# Patient Record
Sex: Female | Born: 1955 | Race: Black or African American | Hispanic: No | Marital: Single | Smoking: Current every day smoker
Health system: Southern US, Community
[De-identification: ages and names within clinical notes are randomized; demographics above are authoritative.]

## PROBLEM LIST (undated history)

## (undated) DIAGNOSIS — K219 Gastro-esophageal reflux disease without esophagitis: Secondary | ICD-10-CM

## (undated) DIAGNOSIS — F419 Anxiety disorder, unspecified: Secondary | ICD-10-CM

## (undated) DIAGNOSIS — I214 Non-ST elevation (NSTEMI) myocardial infarction: Secondary | ICD-10-CM

## (undated) DIAGNOSIS — I739 Peripheral vascular disease, unspecified: Secondary | ICD-10-CM

## (undated) DIAGNOSIS — I701 Atherosclerosis of renal artery: Secondary | ICD-10-CM

## (undated) DIAGNOSIS — I771 Stricture of artery: Secondary | ICD-10-CM

## (undated) DIAGNOSIS — K254 Chronic or unspecified gastric ulcer with hemorrhage: Secondary | ICD-10-CM

## (undated) DIAGNOSIS — M545 Other chronic pain: Secondary | ICD-10-CM

## (undated) DIAGNOSIS — F32A Depression, unspecified: Secondary | ICD-10-CM

## (undated) DIAGNOSIS — N183 Chronic kidney disease, stage 3 (moderate): Secondary | ICD-10-CM

## (undated) DIAGNOSIS — E1142 Type 2 diabetes mellitus with diabetic polyneuropathy: Secondary | ICD-10-CM

## (undated) DIAGNOSIS — Z9289 Personal history of other medical treatment: Secondary | ICD-10-CM

## (undated) DIAGNOSIS — R51 Headache: Secondary | ICD-10-CM

## (undated) DIAGNOSIS — R519 Headache, unspecified: Secondary | ICD-10-CM

## (undated) DIAGNOSIS — I10 Essential (primary) hypertension: Secondary | ICD-10-CM

## (undated) DIAGNOSIS — G8929 Other chronic pain: Secondary | ICD-10-CM

## (undated) DIAGNOSIS — E119 Type 2 diabetes mellitus without complications: Secondary | ICD-10-CM

## (undated) DIAGNOSIS — J189 Pneumonia, unspecified organism: Secondary | ICD-10-CM

## (undated) DIAGNOSIS — I2510 Atherosclerotic heart disease of native coronary artery without angina pectoris: Secondary | ICD-10-CM

## (undated) DIAGNOSIS — D649 Anemia, unspecified: Secondary | ICD-10-CM

## (undated) DIAGNOSIS — M1990 Unspecified osteoarthritis, unspecified site: Secondary | ICD-10-CM

## (undated) DIAGNOSIS — E785 Hyperlipidemia, unspecified: Secondary | ICD-10-CM

## (undated) DIAGNOSIS — I779 Disorder of arteries and arterioles, unspecified: Secondary | ICD-10-CM

## (undated) DIAGNOSIS — F329 Major depressive disorder, single episode, unspecified: Secondary | ICD-10-CM

## (undated) DIAGNOSIS — K922 Gastrointestinal hemorrhage, unspecified: Secondary | ICD-10-CM

## (undated) DIAGNOSIS — I82409 Acute embolism and thrombosis of unspecified deep veins of unspecified lower extremity: Secondary | ICD-10-CM

## (undated) HISTORY — DX: Atherosclerosis of renal artery: I70.1

## (undated) HISTORY — PX: LYMPH GLAND EXCISION: SHX13

## (undated) HISTORY — DX: Stricture of artery: I77.1

## (undated) HISTORY — DX: Peripheral vascular disease, unspecified: I73.9

## (undated) HISTORY — DX: Acute embolism and thrombosis of unspecified deep veins of unspecified lower extremity: I82.409

## (undated) HISTORY — PX: BACK SURGERY: SHX140

## (undated) HISTORY — PX: CARDIAC CATHETERIZATION: SHX172

## (undated) HISTORY — PX: CORONARY ANGIOPLASTY WITH STENT PLACEMENT: SHX49

## (undated) HISTORY — PX: SHOULDER ADHESION RELEASE: SHX773

## (undated) HISTORY — PX: FRACTURE SURGERY: SHX138

## (undated) HISTORY — PX: CATARACT EXTRACTION, BILATERAL: SHX1313

## (undated) HISTORY — PX: KNEE ARTHROSCOPY: SUR90

## (undated) HISTORY — DX: Hyperlipidemia, unspecified: E78.5

## (undated) HISTORY — DX: Chronic kidney disease, stage 3 (moderate): N18.3

---

## 1987-11-25 HISTORY — PX: TUBAL LIGATION: SHX77

## 2000-02-27 ENCOUNTER — Emergency Department (HOSPITAL_COMMUNITY): Admission: EM | Admit: 2000-02-27 | Discharge: 2000-02-27 | Payer: Self-pay | Admitting: Emergency Medicine

## 2000-03-01 ENCOUNTER — Emergency Department (HOSPITAL_COMMUNITY): Admission: EM | Admit: 2000-03-01 | Discharge: 2000-03-01 | Payer: Self-pay | Admitting: *Deleted

## 2001-02-14 ENCOUNTER — Emergency Department (HOSPITAL_COMMUNITY): Admission: EM | Admit: 2001-02-14 | Discharge: 2001-02-14 | Payer: Self-pay | Admitting: Emergency Medicine

## 2002-02-27 ENCOUNTER — Emergency Department (HOSPITAL_COMMUNITY): Admission: EM | Admit: 2002-02-27 | Discharge: 2002-02-27 | Payer: Self-pay | Admitting: Emergency Medicine

## 2002-09-26 ENCOUNTER — Encounter: Payer: Self-pay | Admitting: Emergency Medicine

## 2002-09-26 ENCOUNTER — Encounter: Payer: Self-pay | Admitting: Surgery

## 2002-09-26 ENCOUNTER — Inpatient Hospital Stay (HOSPITAL_COMMUNITY): Admission: AC | Admit: 2002-09-26 | Discharge: 2002-09-27 | Payer: Self-pay

## 2002-10-03 ENCOUNTER — Encounter: Admission: RE | Admit: 2002-10-03 | Discharge: 2002-10-03 | Payer: Self-pay | Admitting: Internal Medicine

## 2002-11-30 ENCOUNTER — Encounter: Admission: RE | Admit: 2002-11-30 | Discharge: 2003-02-16 | Payer: Self-pay | Admitting: Orthopedic Surgery

## 2003-02-22 ENCOUNTER — Encounter: Admission: RE | Admit: 2003-02-22 | Discharge: 2003-05-23 | Payer: Self-pay | Admitting: Orthopedic Surgery

## 2003-03-25 HISTORY — PX: EXAM UNDER ANESTHESIA WITH MANIPULATION OF SHOULDER: SHX5817

## 2003-04-10 ENCOUNTER — Ambulatory Visit (HOSPITAL_BASED_OUTPATIENT_CLINIC_OR_DEPARTMENT_OTHER): Admission: RE | Admit: 2003-04-10 | Discharge: 2003-04-10 | Payer: Self-pay | Admitting: Orthopedic Surgery

## 2003-05-24 ENCOUNTER — Encounter: Admission: RE | Admit: 2003-05-24 | Discharge: 2003-06-08 | Payer: Self-pay | Admitting: Orthopedic Surgery

## 2003-12-24 ENCOUNTER — Emergency Department (HOSPITAL_COMMUNITY): Admission: EM | Admit: 2003-12-24 | Discharge: 2003-12-25 | Payer: Self-pay | Admitting: *Deleted

## 2004-01-04 ENCOUNTER — Emergency Department (HOSPITAL_COMMUNITY): Admission: EM | Admit: 2004-01-04 | Discharge: 2004-01-04 | Payer: Self-pay | Admitting: Family Medicine

## 2007-08-12 ENCOUNTER — Ambulatory Visit: Payer: Self-pay | Admitting: Internal Medicine

## 2007-08-16 ENCOUNTER — Ambulatory Visit: Payer: Self-pay | Admitting: *Deleted

## 2008-02-29 ENCOUNTER — Emergency Department (HOSPITAL_COMMUNITY): Admission: EM | Admit: 2008-02-29 | Discharge: 2008-02-29 | Payer: Self-pay | Admitting: Emergency Medicine

## 2008-04-12 ENCOUNTER — Ambulatory Visit: Payer: Self-pay | Admitting: Internal Medicine

## 2008-04-14 ENCOUNTER — Ambulatory Visit: Payer: Self-pay | Admitting: Internal Medicine

## 2008-06-30 ENCOUNTER — Emergency Department (HOSPITAL_COMMUNITY): Admission: EM | Admit: 2008-06-30 | Discharge: 2008-06-30 | Payer: Self-pay | Admitting: Emergency Medicine

## 2009-01-11 ENCOUNTER — Ambulatory Visit: Payer: Self-pay | Admitting: Family Medicine

## 2009-05-31 ENCOUNTER — Emergency Department (HOSPITAL_COMMUNITY): Admission: EM | Admit: 2009-05-31 | Discharge: 2009-05-31 | Payer: Self-pay | Admitting: Family Medicine

## 2009-08-23 ENCOUNTER — Ambulatory Visit: Payer: Self-pay | Admitting: Internal Medicine

## 2009-08-23 ENCOUNTER — Encounter (INDEPENDENT_AMBULATORY_CARE_PROVIDER_SITE_OTHER): Payer: Self-pay | Admitting: Family Medicine

## 2009-08-24 ENCOUNTER — Encounter (INDEPENDENT_AMBULATORY_CARE_PROVIDER_SITE_OTHER): Payer: Self-pay | Admitting: Family Medicine

## 2009-08-24 LAB — CONVERTED CEMR LAB: Microalb, Ur: 19.81 mg/dL — ABNORMAL HIGH (ref 0.00–1.89)

## 2009-08-29 ENCOUNTER — Ambulatory Visit (HOSPITAL_COMMUNITY): Admission: RE | Admit: 2009-08-29 | Discharge: 2009-08-29 | Payer: Self-pay | Admitting: Family Medicine

## 2009-09-04 ENCOUNTER — Telehealth (INDEPENDENT_AMBULATORY_CARE_PROVIDER_SITE_OTHER): Payer: Self-pay | Admitting: *Deleted

## 2009-09-05 ENCOUNTER — Ambulatory Visit: Payer: Self-pay | Admitting: Internal Medicine

## 2009-09-06 ENCOUNTER — Encounter (INDEPENDENT_AMBULATORY_CARE_PROVIDER_SITE_OTHER): Payer: Self-pay | Admitting: Family Medicine

## 2009-09-06 ENCOUNTER — Ambulatory Visit (HOSPITAL_COMMUNITY): Admission: RE | Admit: 2009-09-06 | Discharge: 2009-09-06 | Payer: Self-pay | Admitting: Family Medicine

## 2009-09-24 ENCOUNTER — Ambulatory Visit: Payer: Self-pay | Admitting: Family Medicine

## 2009-09-25 ENCOUNTER — Encounter (INDEPENDENT_AMBULATORY_CARE_PROVIDER_SITE_OTHER): Payer: Self-pay | Admitting: Family Medicine

## 2009-09-25 LAB — CONVERTED CEMR LAB
ALT: 55 units/L — ABNORMAL HIGH (ref 0–35)
AST: 59 units/L — ABNORMAL HIGH (ref 0–37)
Albumin: 4.3 g/dL (ref 3.5–5.2)
Alkaline Phosphatase: 114 units/L (ref 39–117)
BUN: 15 mg/dL (ref 6–23)
Basophils Absolute: 0 10*3/uL (ref 0.0–0.1)
Basophils Relative: 1 % (ref 0–1)
CO2: 21 meq/L (ref 19–32)
Calcium: 9.4 mg/dL (ref 8.4–10.5)
Chloride: 101 meq/L (ref 96–112)
Cholesterol: 308 mg/dL — ABNORMAL HIGH (ref 0–200)
Creatinine, Ser: 0.96 mg/dL (ref 0.40–1.20)
Eosinophils Absolute: 0.1 10*3/uL (ref 0.0–0.7)
Eosinophils Relative: 1 % (ref 0–5)
Glucose, Bld: 150 mg/dL — ABNORMAL HIGH (ref 70–99)
HCT: 39.9 % (ref 36.0–46.0)
HDL: 49 mg/dL (ref >39)
Hemoglobin: 12.2 g/dL (ref 12.0–15.0)
Lymphocytes Relative: 42 % (ref 12–46)
Lymphs Abs: 2.7 10*3/uL (ref 0.7–4.0)
MCHC: 30.6 g/dL (ref 30.0–36.0)
MCV: 78.5 fL (ref 78.0–100.0)
Monocytes Absolute: 0.5 10*3/uL (ref 0.1–1.0)
Monocytes Relative: 7 % (ref 3–12)
Neutro Abs: 3.2 10*3/uL (ref 1.7–7.7)
Neutrophils Relative %: 49 % (ref 43–77)
Platelets: 289 10*3/uL (ref 150–400)
Potassium: 4.2 meq/L (ref 3.5–5.3)
RBC: 5.08 M/uL (ref 3.87–5.11)
RDW: 14.7 % (ref 11.5–15.5)
Sodium: 141 meq/L (ref 135–145)
Total Bilirubin: 0.6 mg/dL (ref 0.3–1.2)
Total CHOL/HDL Ratio: 6.3
Total Protein: 8.2 g/dL (ref 6.0–8.3)
Triglycerides: 634 mg/dL — ABNORMAL HIGH (ref <150)
Vit D, 25-Hydroxy: 4 ng/mL — ABNORMAL LOW (ref 30–89)
WBC: 6.5 10*3/uL (ref 4.0–10.5)

## 2009-11-11 ENCOUNTER — Emergency Department (HOSPITAL_COMMUNITY): Admission: EM | Admit: 2009-11-11 | Discharge: 2009-11-11 | Payer: Self-pay | Admitting: Emergency Medicine

## 2009-11-20 ENCOUNTER — Ambulatory Visit: Payer: Self-pay | Admitting: Family Medicine

## 2010-07-16 ENCOUNTER — Emergency Department (HOSPITAL_BASED_OUTPATIENT_CLINIC_OR_DEPARTMENT_OTHER): Admission: EM | Admit: 2010-07-16 | Discharge: 2010-07-16 | Payer: Self-pay | Admitting: Emergency Medicine

## 2010-08-27 ENCOUNTER — Emergency Department (HOSPITAL_BASED_OUTPATIENT_CLINIC_OR_DEPARTMENT_OTHER): Admission: EM | Admit: 2010-08-27 | Discharge: 2010-08-28 | Payer: Self-pay | Admitting: Emergency Medicine

## 2010-08-28 ENCOUNTER — Emergency Department (HOSPITAL_BASED_OUTPATIENT_CLINIC_OR_DEPARTMENT_OTHER): Admission: EM | Admit: 2010-08-28 | Discharge: 2010-08-28 | Payer: Self-pay | Admitting: Emergency Medicine

## 2011-01-03 ENCOUNTER — Encounter (INDEPENDENT_AMBULATORY_CARE_PROVIDER_SITE_OTHER): Payer: Self-pay | Admitting: Family Medicine

## 2011-01-03 ENCOUNTER — Other Ambulatory Visit (HOSPITAL_COMMUNITY): Payer: Self-pay | Admitting: Family Medicine

## 2011-01-03 DIAGNOSIS — Z1231 Encounter for screening mammogram for malignant neoplasm of breast: Secondary | ICD-10-CM

## 2011-01-03 DIAGNOSIS — M47812 Spondylosis without myelopathy or radiculopathy, cervical region: Secondary | ICD-10-CM

## 2011-01-03 LAB — CONVERTED CEMR LAB: Microalb, Ur: 15.98 mg/dL — ABNORMAL HIGH (ref 0.00–1.89)

## 2011-01-16 ENCOUNTER — Ambulatory Visit (HOSPITAL_COMMUNITY)
Admission: RE | Admit: 2011-01-16 | Discharge: 2011-01-16 | Disposition: A | Discharge status: Home / Self Care | Payer: Self-pay | Source: Ambulatory Visit | Attending: Family Medicine | Admitting: Family Medicine

## 2011-01-16 DIAGNOSIS — Z1231 Encounter for screening mammogram for malignant neoplasm of breast: Secondary | ICD-10-CM

## 2011-01-22 ENCOUNTER — Other Ambulatory Visit (HOSPITAL_COMMUNITY): Payer: Self-pay | Admitting: Family Medicine

## 2011-01-22 DIAGNOSIS — Z1231 Encounter for screening mammogram for malignant neoplasm of breast: Secondary | ICD-10-CM

## 2011-01-24 ENCOUNTER — Ambulatory Visit (HOSPITAL_COMMUNITY)
Admission: RE | Admit: 2011-01-24 | Discharge: 2011-01-24 | Disposition: A | Discharge status: Home / Self Care | Payer: Self-pay | Source: Ambulatory Visit | Attending: Family Medicine | Admitting: Family Medicine

## 2011-01-24 DIAGNOSIS — Z1231 Encounter for screening mammogram for malignant neoplasm of breast: Secondary | ICD-10-CM | POA: Insufficient documentation

## 2011-01-27 ENCOUNTER — Ambulatory Visit (HOSPITAL_COMMUNITY)
Admission: RE | Admit: 2011-01-27 | Discharge: 2011-01-27 | Disposition: A | Discharge status: Home / Self Care | Payer: Self-pay | Source: Ambulatory Visit | Attending: Family Medicine | Admitting: Family Medicine

## 2011-01-27 DIAGNOSIS — M47812 Spondylosis without myelopathy or radiculopathy, cervical region: Secondary | ICD-10-CM | POA: Insufficient documentation

## 2011-01-27 DIAGNOSIS — R209 Unspecified disturbances of skin sensation: Secondary | ICD-10-CM | POA: Insufficient documentation

## 2011-01-27 DIAGNOSIS — M25519 Pain in unspecified shoulder: Secondary | ICD-10-CM | POA: Insufficient documentation

## 2011-01-27 DIAGNOSIS — M79609 Pain in unspecified limb: Secondary | ICD-10-CM | POA: Insufficient documentation

## 2011-01-27 DIAGNOSIS — M542 Cervicalgia: Secondary | ICD-10-CM | POA: Insufficient documentation

## 2011-02-06 LAB — URINALYSIS, ROUTINE W REFLEX MICROSCOPIC
Bilirubin Urine: NEGATIVE
Bilirubin Urine: NEGATIVE
Glucose, UA: NEGATIVE mg/dL
Hgb urine dipstick: NEGATIVE
Ketones, ur: NEGATIVE mg/dL
Ketones, ur: NEGATIVE mg/dL
Specific Gravity, Urine: 1.006 (ref 1.005–1.030)

## 2011-02-06 LAB — DIFFERENTIAL
Eosinophils Absolute: 0.2 10*3/uL (ref 0.0–0.7)
Lymphocytes Relative: 47 % — ABNORMAL HIGH (ref 12–46)
Lymphs Abs: 3.9 10*3/uL (ref 0.7–4.0)
Monocytes Absolute: 0.5 10*3/uL (ref 0.1–1.0)
Neutro Abs: 3.7 10*3/uL (ref 1.7–7.7)

## 2011-02-06 LAB — CBC
Hemoglobin: 10.9 g/dL — ABNORMAL LOW (ref 12.0–15.0)
MCHC: 32.9 g/dL (ref 30.0–36.0)
MCV: 77.4 fL — ABNORMAL LOW (ref 78.0–100.0)
RBC: 4.28 MIL/uL (ref 3.87–5.11)
RDW: 14 % (ref 11.5–15.5)

## 2011-02-06 LAB — COMPREHENSIVE METABOLIC PANEL
AST: 36 U/L (ref 0–37)
Alkaline Phosphatase: 119 U/L — ABNORMAL HIGH (ref 39–117)
Chloride: 110 mEq/L (ref 96–112)
Creatinine, Ser: 0.8 mg/dL (ref 0.4–1.2)
GFR calc Af Amer: >60 mL/min (ref >60)
Glucose, Bld: 180 mg/dL — ABNORMAL HIGH (ref 70–99)
Sodium: 143 mEq/L (ref 135–145)
Total Bilirubin: 0.4 mg/dL (ref 0.3–1.2)

## 2011-02-06 LAB — URINE MICROSCOPIC-ADD ON

## 2011-02-06 LAB — BASIC METABOLIC PANEL
CO2: 23 mEq/L (ref 19–32)
Calcium: 9.7 mg/dL (ref 8.4–10.5)
GFR calc Af Amer: >60 mL/min (ref >60)
GFR calc non Af Amer: >60 mL/min (ref >60)

## 2011-02-06 LAB — GLUCOSE, CAPILLARY
Glucose-Capillary: 177 mg/dL — ABNORMAL HIGH (ref 70–99)
Glucose-Capillary: 319 mg/dL — ABNORMAL HIGH (ref 70–99)

## 2011-02-06 LAB — HEMOCCULT GUIAC POC 1CARD (OFFICE): Fecal Occult Bld: NEGATIVE

## 2011-02-07 LAB — BASIC METABOLIC PANEL
Chloride: 105 mEq/L (ref 96–112)
Creatinine, Ser: 0.8 mg/dL (ref 0.4–1.2)
GFR calc Af Amer: >60 mL/min (ref >60)
GFR calc non Af Amer: >60 mL/min (ref >60)

## 2011-02-07 LAB — DIFFERENTIAL
Eosinophils Relative: 2 % (ref 0–5)
Lymphocytes Relative: 40 % (ref 12–46)
Lymphs Abs: 2.7 10*3/uL (ref 0.7–4.0)
Monocytes Absolute: 0.3 10*3/uL (ref 0.1–1.0)

## 2011-02-07 LAB — POCT TOXICOLOGY PANEL

## 2011-02-07 LAB — URINALYSIS, ROUTINE W REFLEX MICROSCOPIC
Ketones, ur: NEGATIVE mg/dL
Nitrite: NEGATIVE
pH: 5.5 (ref 5.0–8.0)

## 2011-02-07 LAB — URINE MICROSCOPIC-ADD ON

## 2011-02-07 LAB — CBC
HCT: 40.5 % (ref 36.0–46.0)
MCV: 80.2 fL (ref 78.0–100.0)
Platelets: 287 10*3/uL (ref 150–400)
RBC: 5.05 MIL/uL (ref 3.87–5.11)
WBC: 6.7 10*3/uL (ref 4.0–10.5)

## 2011-02-24 ENCOUNTER — Emergency Department (HOSPITAL_COMMUNITY)
Admission: EM | Admit: 2011-02-24 | Discharge: 2011-02-24 | Disposition: A | Discharge status: Home / Self Care | Payer: Self-pay | Attending: Emergency Medicine | Admitting: Emergency Medicine

## 2011-02-24 DIAGNOSIS — I10 Essential (primary) hypertension: Secondary | ICD-10-CM | POA: Insufficient documentation

## 2011-02-24 DIAGNOSIS — Z862 Personal history of diseases of the blood and blood-forming organs and certain disorders involving the immune mechanism: Secondary | ICD-10-CM | POA: Insufficient documentation

## 2011-02-24 DIAGNOSIS — E785 Hyperlipidemia, unspecified: Secondary | ICD-10-CM | POA: Insufficient documentation

## 2011-02-24 DIAGNOSIS — M542 Cervicalgia: Secondary | ICD-10-CM | POA: Insufficient documentation

## 2011-02-24 DIAGNOSIS — M545 Low back pain, unspecified: Secondary | ICD-10-CM | POA: Insufficient documentation

## 2011-02-24 DIAGNOSIS — F3289 Other specified depressive episodes: Secondary | ICD-10-CM | POA: Insufficient documentation

## 2011-02-24 DIAGNOSIS — Z79899 Other long term (current) drug therapy: Secondary | ICD-10-CM | POA: Insufficient documentation

## 2011-02-24 DIAGNOSIS — E119 Type 2 diabetes mellitus without complications: Secondary | ICD-10-CM | POA: Insufficient documentation

## 2011-02-24 DIAGNOSIS — F329 Major depressive disorder, single episode, unspecified: Secondary | ICD-10-CM | POA: Insufficient documentation

## 2011-02-24 DIAGNOSIS — N390 Urinary tract infection, site not specified: Secondary | ICD-10-CM | POA: Insufficient documentation

## 2011-02-24 DIAGNOSIS — Z8639 Personal history of other endocrine, nutritional and metabolic disease: Secondary | ICD-10-CM | POA: Insufficient documentation

## 2011-02-24 DIAGNOSIS — M5412 Radiculopathy, cervical region: Secondary | ICD-10-CM | POA: Insufficient documentation

## 2011-02-24 DIAGNOSIS — A599 Trichomoniasis, unspecified: Secondary | ICD-10-CM | POA: Insufficient documentation

## 2011-02-24 LAB — URINALYSIS, ROUTINE W REFLEX MICROSCOPIC
Bilirubin Urine: NEGATIVE
Hgb urine dipstick: NEGATIVE
Specific Gravity, Urine: 1.014 (ref 1.005–1.030)
Urobilinogen, UA: 0.2 mg/dL (ref 0.0–1.0)

## 2011-02-24 LAB — URINE MICROSCOPIC-ADD ON
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
## 2011-04-25 HISTORY — PX: ANTERIOR CERVICAL DECOMP/DISCECTOMY FUSION: SHX1161

## 2011-05-13 ENCOUNTER — Encounter (HOSPITAL_COMMUNITY)
Admission: RE | Admit: 2011-05-13 | Discharge: 2011-05-13 | Disposition: A | Discharge status: Home / Self Care | Payer: Self-pay | Source: Ambulatory Visit | Attending: Neurosurgery | Admitting: Neurosurgery

## 2011-05-13 ENCOUNTER — Other Ambulatory Visit (HOSPITAL_COMMUNITY): Payer: Self-pay | Admitting: Neurosurgery

## 2011-05-13 DIAGNOSIS — E785 Hyperlipidemia, unspecified: Secondary | ICD-10-CM | POA: Insufficient documentation

## 2011-05-13 DIAGNOSIS — I10 Essential (primary) hypertension: Secondary | ICD-10-CM | POA: Insufficient documentation

## 2011-05-13 DIAGNOSIS — M5020 Other cervical disc displacement, unspecified cervical region: Secondary | ICD-10-CM

## 2011-05-13 DIAGNOSIS — E119 Type 2 diabetes mellitus without complications: Secondary | ICD-10-CM | POA: Insufficient documentation

## 2011-05-13 DIAGNOSIS — Z01812 Encounter for preprocedural laboratory examination: Secondary | ICD-10-CM | POA: Insufficient documentation

## 2011-05-13 DIAGNOSIS — Z01818 Encounter for other preprocedural examination: Secondary | ICD-10-CM | POA: Insufficient documentation

## 2011-05-13 DIAGNOSIS — I77819 Aortic ectasia, unspecified site: Secondary | ICD-10-CM | POA: Insufficient documentation

## 2011-05-13 DIAGNOSIS — F17200 Nicotine dependence, unspecified, uncomplicated: Secondary | ICD-10-CM | POA: Insufficient documentation

## 2011-05-13 LAB — CBC
Platelets: 376 10*3/uL (ref 150–400)
RDW: 15.9 % — ABNORMAL HIGH (ref 11.5–15.5)
WBC: 9.5 10*3/uL (ref 4.0–10.5)

## 2011-05-13 LAB — URINALYSIS, ROUTINE W REFLEX MICROSCOPIC
Glucose, UA: NEGATIVE mg/dL
Leukocytes, UA: NEGATIVE
Nitrite: NEGATIVE
Protein, ur: NEGATIVE mg/dL
Urobilinogen, UA: 0.2 mg/dL (ref 0.0–1.0)

## 2011-05-13 LAB — PROTIME-INR
INR: 0.97 (ref 0.00–1.49)
Prothrombin Time: 13.1 seconds (ref 11.6–15.2)

## 2011-05-13 LAB — BASIC METABOLIC PANEL
BUN: 17 mg/dL (ref 6–23)
Calcium: 9.5 mg/dL (ref 8.4–10.5)
Creatinine, Ser: 0.82 mg/dL (ref 0.50–1.10)
GFR calc Af Amer: >60 mL/min (ref >60)

## 2011-05-13 LAB — SURGICAL PCR SCREEN: MRSA, PCR: NEGATIVE

## 2011-05-13 LAB — APTT: aPTT: 34 seconds (ref 24–37)

## 2011-05-20 ENCOUNTER — Inpatient Hospital Stay (HOSPITAL_COMMUNITY)
Admission: RE | Admit: 2011-05-20 | Discharge: 2011-05-22 | DRG: 472 | Disposition: A | Discharge status: Home / Self Care | Payer: Medicaid Other | Source: Ambulatory Visit | Attending: Neurosurgery | Admitting: Neurosurgery

## 2011-05-20 ENCOUNTER — Inpatient Hospital Stay (HOSPITAL_COMMUNITY): Payer: Medicaid Other

## 2011-05-20 DIAGNOSIS — M129 Arthropathy, unspecified: Secondary | ICD-10-CM | POA: Diagnosis present

## 2011-05-20 DIAGNOSIS — Z01818 Encounter for other preprocedural examination: Secondary | ICD-10-CM

## 2011-05-20 DIAGNOSIS — Z01812 Encounter for preprocedural laboratory examination: Secondary | ICD-10-CM

## 2011-05-20 DIAGNOSIS — I10 Essential (primary) hypertension: Secondary | ICD-10-CM | POA: Diagnosis present

## 2011-05-20 DIAGNOSIS — E119 Type 2 diabetes mellitus without complications: Secondary | ICD-10-CM | POA: Diagnosis present

## 2011-05-20 DIAGNOSIS — M5000 Cervical disc disorder with myelopathy, unspecified cervical region: Secondary | ICD-10-CM | POA: Diagnosis present

## 2011-05-20 DIAGNOSIS — F17200 Nicotine dependence, unspecified, uncomplicated: Secondary | ICD-10-CM | POA: Diagnosis present

## 2011-05-20 DIAGNOSIS — M4712 Other spondylosis with myelopathy, cervical region: Principal | ICD-10-CM | POA: Diagnosis present

## 2011-05-20 DIAGNOSIS — M47812 Spondylosis without myelopathy or radiculopathy, cervical region: Secondary | ICD-10-CM | POA: Diagnosis present

## 2011-05-21 LAB — GLUCOSE, CAPILLARY
Glucose-Capillary: 216 mg/dL — ABNORMAL HIGH (ref 70–99)
Glucose-Capillary: 123 mg/dL — ABNORMAL HIGH (ref 70–99)
Glucose-Capillary: 167 mg/dL — ABNORMAL HIGH (ref 70–99)
Glucose-Capillary: 114 mg/dL — ABNORMAL HIGH (ref 70–99)
Glucose-Capillary: 112 mg/dL — ABNORMAL HIGH (ref 70–99)

## 2011-05-22 LAB — GLUCOSE, CAPILLARY: Glucose-Capillary: 106 mg/dL — ABNORMAL HIGH (ref 70–99)

## 2011-05-22 NOTE — Op Note (Signed)
NAMELASHAUN, POCH NO.:  [id number]  MEDICAL RECORD NO.:  [id number]  LOCATION:  3017                         FACILITY:  [facility]  PHYSICIAN:  Clydene Fake, M.D.  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  PROCEDURE:  Anterior cervical decompression, diskectomy, and fusion at C3-C4, C4-C5, C5-C6, and C6-C7 with LifeNet allograft bone, Trestle anterior cervical plate from C3 through C7.  SURGEON:  Clydene Fake, MD  ASSISTANT:  Cristi Loron, MD.  ANESTHESIA:  General endotracheal tube anesthesia.  ESTIMATED BLOOD LOSS:  Minimal.  BLOOD GIVEN:  None.  DRAINS:  None.  COMPLICATIONS:  None.  REASON FOR PROCEDURE:  The patient is a 55 year old woman who has been having neck and arm pain, numbness, also myelopathic symptoms with clumsiness of arms and legs, and found to be myelopathic on exam.  An MRI of cervical spine shows severe spinal changes with disk protrusions causing a stenosis and cord compression at C3-C4, C4-C5, C5-C6, and C6- C7.  The patient was brought in for decompression and fusion at these levels.  PROCEDURE IN DETAIL:  The patient was brought in the operating room, general anesthesia induced.  The patient was placed in a 10 pounds halter traction and prepped and draped in sterile fashion.  Site of incision was injected 10 mL of 1% lidocaine.  An oblique incision was made over the anterior border of the sternocleidomastoid muscle on the left side of the neck.  Incision was taken down to the platysma and hemostasis was obtained with Bovie  cauterization.  The platysma was incised with the Bovie and blunt dissection taken through the anterior cervical fascia to the anterior cervical spine.  Three needles were placed in disk spaces.  X-rays were obtained showing the needles were at the C3-C4, C4-C5, and C6-C7 spaces.  The disk spaces were incised and partial diskectomy done as the needles were removed.  Longus coli muscles were reflected laterally from C3 through C7 on each side and a self-retaining retractor was placed so we could see the C3-C4, C4-C5 spaces.  Distraction pins were placed in the C3 and C5 and the interspace was distracted.  Microscope was brought in for microdissection.  Diskectomy continued with curettes, pituitary rongeurs, and then 1-2-mm Kerrison punches were used to remove posterior disk osteophyte and ligament, decompressing the central canal.  We then performed bilateral foraminotomies.  These were repeated at the C4-C5 level.  A high-speed drill was used to remove cartilaginous endplate and we measured the height of the disk space to be 4 mm and 4-mm LifeNet allograft bones were then tapped into place at each of these levels. Removed the distraction pin from C3, then moved our retraction down so we could see the C5-C6 and C6-C7 level, placed the pin in the C7 and we already had incised the disk spaces and  started partial diskectomy at C5- C6 and C6-C7 and then distracted the interspaces.  Microscope was used and diskectomy continued at C5-C6 and C6-C7 with high-speed drill, curettes, and pituitary rongeurs and 1-2-mm Kerrison punches were used to remove posterior disk osteophyte and ligament, decompressing the central canal and bilateral foraminotomies were performed at both C5-C6 and C6-C7 levels.  We then measured the height of disk space to be 4 mm at each spot, two 4-mm LifeNet allograft bones were tapped into place at these 2 levels.  We irrigated with antibiotic solution.  Microscope  was removed from the field.  The distraction pins were removed, weight was removed from the traction.  The bone was in good firm position at each of the 4-mm spots.  Anterior osteophytes were removed with Leksell rongeurs.  A Trestle anterior cervical plate was placed over the cervical spine and 2 screws placed in the C3, 2 in the C4, 2 in the C5, 2 in the C6, 2 in the C7.  These were finally tightened.  A lateral x- ray was obtained showing good position of plate, screws, bone plugs at C3 through C7.  We had a very good hemostasis.  We irrigated it with antibiotic solution with good hemostasis with Gelfoam thrombin and bipolar cauterization.  The Gelfoam was irrigated out and then the platysma was closed with 3-0 Vicryl interrupted sutures, subcutaneous tissue closed with the same, skin closed with benzoin and Steri-Strips. Dressing was placed.  The patient was placed in a hard cervical collar, awoken from anesthesia, and transferred to the recovery room in stable condition.          ______________________________ Clydene Fake, M.D.     JRH/MEDQ  D:  05/20/2011  T:  05/21/2011  Job:  [id number]  Electronically Signed by Colon Branch M.D. on 05/22/2011 02:23:16 PM

## 2011-08-19 ENCOUNTER — Emergency Department (HOSPITAL_COMMUNITY): Payer: Self-pay

## 2011-08-19 ENCOUNTER — Emergency Department (HOSPITAL_COMMUNITY)
Admission: EM | Admit: 2011-08-19 | Discharge: 2011-08-19 | Disposition: A | Discharge status: Home / Self Care | Payer: Self-pay | Attending: Emergency Medicine | Admitting: Emergency Medicine

## 2011-08-19 DIAGNOSIS — F17200 Nicotine dependence, unspecified, uncomplicated: Secondary | ICD-10-CM | POA: Insufficient documentation

## 2011-08-19 DIAGNOSIS — F3289 Other specified depressive episodes: Secondary | ICD-10-CM | POA: Insufficient documentation

## 2011-08-19 DIAGNOSIS — IMO0002 Reserved for concepts with insufficient information to code with codable children: Secondary | ICD-10-CM | POA: Insufficient documentation

## 2011-08-19 DIAGNOSIS — M25519 Pain in unspecified shoulder: Secondary | ICD-10-CM | POA: Insufficient documentation

## 2011-08-19 DIAGNOSIS — M109 Gout, unspecified: Secondary | ICD-10-CM | POA: Insufficient documentation

## 2011-08-19 DIAGNOSIS — F329 Major depressive disorder, single episode, unspecified: Secondary | ICD-10-CM | POA: Insufficient documentation

## 2011-08-19 DIAGNOSIS — Z79899 Other long term (current) drug therapy: Secondary | ICD-10-CM | POA: Insufficient documentation

## 2011-08-19 DIAGNOSIS — E785 Hyperlipidemia, unspecified: Secondary | ICD-10-CM | POA: Insufficient documentation

## 2011-08-19 DIAGNOSIS — F1911 Other psychoactive substance abuse, in remission: Secondary | ICD-10-CM | POA: Insufficient documentation

## 2011-08-19 DIAGNOSIS — I10 Essential (primary) hypertension: Secondary | ICD-10-CM | POA: Insufficient documentation

## 2011-08-19 DIAGNOSIS — E119 Type 2 diabetes mellitus without complications: Secondary | ICD-10-CM | POA: Insufficient documentation

## 2011-08-19 LAB — CK TOTAL AND CKMB (NOT AT ARMC)
CK, MB: 2.9 ng/mL (ref 0.3–4.0)
Relative Index: 1.3 (ref 0.0–2.5)
Total CK: 218 U/L — ABNORMAL HIGH (ref 7–177)

## 2011-08-19 LAB — COMPREHENSIVE METABOLIC PANEL
BUN: 16 mg/dL (ref 6–23)
CO2: 26 mEq/L (ref 19–32)
Calcium: 9.6 mg/dL (ref 8.4–10.5)
Chloride: 95 mEq/L — ABNORMAL LOW (ref 96–112)
Creatinine, Ser: 1.31 mg/dL — ABNORMAL HIGH (ref 0.50–1.10)
GFR calc Af Amer: 51 mL/min — ABNORMAL LOW (ref >60)
GFR calc non Af Amer: 42 mL/min — ABNORMAL LOW (ref >60)
Glucose, Bld: 128 mg/dL — ABNORMAL HIGH (ref 70–99)
Total Bilirubin: 0.3 mg/dL (ref 0.3–1.2)

## 2011-08-19 LAB — DIFFERENTIAL
Basophils Absolute: 0.1 10*3/uL (ref 0.0–0.1)
Basophils Relative: 1 % (ref 0–1)
Eosinophils Absolute: 0.2 10*3/uL (ref 0.0–0.7)
Lymphocytes Relative: 52 % — ABNORMAL HIGH (ref 12–46)
Lymphs Abs: 4.4 10*3/uL — ABNORMAL HIGH (ref 0.7–4.0)
Monocytes Relative: 7 % (ref 3–12)
Neutro Abs: 3.2 10*3/uL (ref 1.7–7.7)

## 2011-08-19 LAB — CBC
HCT: 33.7 % — ABNORMAL LOW (ref 36.0–46.0)
MCH: 22.6 pg — ABNORMAL LOW (ref 26.0–34.0)
MCV: 69.8 fL — ABNORMAL LOW (ref 78.0–100.0)
Platelets: 343 10*3/uL (ref 150–400)
RBC: 4.83 MIL/uL (ref 3.87–5.11)
WBC: 8.5 10*3/uL (ref 4.0–10.5)

## 2011-08-19 LAB — GLUCOSE, CAPILLARY: Glucose-Capillary: 155 mg/dL — ABNORMAL HIGH (ref 70–99)

## 2011-08-19 LAB — POCT I-STAT TROPONIN I: Troponin i, poc: 0 ng/mL (ref 0.00–0.08)

## 2011-11-09 ENCOUNTER — Emergency Department (HOSPITAL_COMMUNITY)
Admission: EM | Admit: 2011-11-09 | Discharge: 2011-11-09 | Disposition: A | Discharge status: Home / Self Care | Payer: Medicaid Other | Attending: Emergency Medicine | Admitting: Emergency Medicine

## 2011-11-09 ENCOUNTER — Encounter: Payer: Self-pay | Admitting: Emergency Medicine

## 2011-11-09 DIAGNOSIS — M545 Low back pain, unspecified: Secondary | ICD-10-CM | POA: Insufficient documentation

## 2011-11-09 DIAGNOSIS — G8929 Other chronic pain: Secondary | ICD-10-CM | POA: Insufficient documentation

## 2011-11-09 DIAGNOSIS — Z862 Personal history of diseases of the blood and blood-forming organs and certain disorders involving the immune mechanism: Secondary | ICD-10-CM | POA: Insufficient documentation

## 2011-11-09 DIAGNOSIS — M109 Gout, unspecified: Secondary | ICD-10-CM | POA: Insufficient documentation

## 2011-11-09 DIAGNOSIS — R209 Unspecified disturbances of skin sensation: Secondary | ICD-10-CM | POA: Insufficient documentation

## 2011-11-09 DIAGNOSIS — I10 Essential (primary) hypertension: Secondary | ICD-10-CM | POA: Insufficient documentation

## 2011-11-09 DIAGNOSIS — Z8639 Personal history of other endocrine, nutritional and metabolic disease: Secondary | ICD-10-CM | POA: Insufficient documentation

## 2011-11-09 DIAGNOSIS — E119 Type 2 diabetes mellitus without complications: Secondary | ICD-10-CM | POA: Insufficient documentation

## 2011-11-09 DIAGNOSIS — Z79899 Other long term (current) drug therapy: Secondary | ICD-10-CM | POA: Insufficient documentation

## 2011-11-09 DIAGNOSIS — R109 Unspecified abdominal pain: Secondary | ICD-10-CM | POA: Insufficient documentation

## 2011-11-09 HISTORY — DX: Essential (primary) hypertension: I10

## 2011-11-09 LAB — URINE MICROSCOPIC-ADD ON

## 2011-11-09 LAB — URINALYSIS, ROUTINE W REFLEX MICROSCOPIC
Bilirubin Urine: NEGATIVE
Hgb urine dipstick: NEGATIVE
Ketones, ur: NEGATIVE mg/dL
Nitrite: NEGATIVE
Protein, ur: 30 mg/dL — AB
Specific Gravity, Urine: 1.009 (ref 1.005–1.030)
Urobilinogen, UA: 1 mg/dL (ref 0.0–1.0)

## 2011-11-09 MED ORDER — OXYCODONE-ACETAMINOPHEN 5-325 MG PO TABS: 1.0000 | ORAL_TABLET | ORAL | Status: AC | PRN

## 2011-11-09 MED ORDER — OXYCODONE-ACETAMINOPHEN 5-325 MG PO TABS
1.0000 | ORAL_TABLET | Freq: Once | ORAL | Status: AC
  Administered 2011-11-09: 1 via ORAL
  Filled 2011-11-09: qty 1

## 2011-11-09 NOTE — ED Notes (Signed)
Pt states she is unable to void at present time. 

## 2011-11-09 NOTE — ED Notes (Signed)
Pt states she has been thru menopause

## 2011-11-09 NOTE — ED Provider Notes (Signed)
I saw and evaluated the patient, reviewed the resident's note and I agree with the findings and plan. Patient with symptoms today most consistent with lumbar radiculopathy. No abdominal pain on palpation. No urinary or bowel symptoms. No weakness in the leg only pain which seems to be mostly in the L1 distribution. Will have followup with PCP.  Gwyneth Sprout, MD 11/09/11 1537

## 2011-11-09 NOTE — ED Provider Notes (Signed)
History     CSN: [id number] Arrival date & time: 11/09/2011 10:55 AM   First MD Initiated Contact with Patient 11/09/11 1131      Chief Complaint  Patient presents with  . Abdominal Pain  . Back Pain    (Consider location/radiation/quality/duration/timing/severity/associated sxs/prior treatment) HPI 55 yo F with h/o HTN, DM, cervical disk dz s/p fusion in 2012, and depression, who p/w 1 wk of worsening L lower back pain.  Pain has been ongoing for roughly 1 month, and pt has appt with neurologist in January.  Pain has worsened over past week, now radiating into LLQ of abdomen and L groin.  Pt increased narcotins to vicodin 1.5 tabs tid, but no relief.  Bending over makes pain the worst.  Denies dysuria, changes in bowel habits or pain with defecation, is postmenopausal.  Has baseline numbness from DM in b/l feet, but no new numbness, tingling, weakness in L leg.  No F/C, no other new health complaints.  Past Medical History  Diagnosis Date  . Hypertension   . Diabetes mellitus   . Gout   . Chronic back pain     History reviewed. No pertinent past surgical history.  History reviewed. No pertinent family history.  History  Substance Use Topics  . Smoking status: Current Everyday Smoker  . Smokeless tobacco: Not on file  . Alcohol Use: No    OB History    Grav Para Term Preterm Abortions TAB SAB Ect Mult Living                  Review of Systems  Allergies  Review of patient's allergies indicates no known allergies.  Home Medications   Current Outpatient Rx  Name Route Sig Dispense Refill  . ALLOPURINOL 100 MG PO TABS Oral Take 100 mg by mouth daily.      . ALLOPURINOL 300 MG PO TABS Oral Take 300 mg by mouth daily.      . CYCLOBENZAPRINE HCL 10 MG PO TABS Oral Take 10 mg by mouth 3 (three) times daily as needed. For pain.     Marland Kitchen DICLOFENAC SODIUM 75 MG PO TBEC Oral Take 75 mg by mouth 2 (two) times daily.      Marland Kitchen HYDROCODONE-ACETAMINOPHEN 5-500 MG PO TABS Oral  Take 1 tablet by mouth 2 (two) times daily as needed. For pain.     Marland Kitchen LISINOPRIL 5 MG PO TABS Oral Take 5 mg by mouth daily.      Marland Kitchen METFORMIN HCL 500 MG PO TABS Oral Take 500 mg by mouth 2 (two) times daily with a meal.        BP 123/60  Pulse 95  Temp(Src) 98.4 F (36.9 C) (Oral)  Resp 18  SpO2 100%  Physical Exam Physical Exam GEN: NAD.  Alert and oriented x 3.  Pleasant, conversant, and cooperative to exam. RESP:  CTAB, no w/r/r CARDIOVASCULAR: RRR, S1, S2, 3/6 HSM at RUSB ABDOMEN: soft, NT/ND, NABS EXT: warm and dry. No edema in b/l LE MSK: No visible spinal deformity.  TTP over L superior buttock.  Good ROM at L hip.  Some pain in stomach/back with straight leg raise, but no sciatica.  Good distal pulses.  Good strength distally, 4/5 strength in proximal LLE.  ED Course  Procedures (including critical care time)  Labs Reviewed  URINALYSIS, ROUTINE W REFLEX MICROSCOPIC - Abnormal; Notable for the following:    Glucose, UA 250 (*)    Protein, ur 30 (*)  Leukocytes, UA TRACE (*)    All other components within normal limits  URINE MICROSCOPIC-ADD ON - Abnormal; Notable for the following:    Squamous Epithelial / LPF MANY (*)    All other components within normal limits  POCT PREGNANCY, URINE  POCT PREGNANCY, URINE   No results found.   No diagnosis found.    MDM  55 yo W with history of cervical DJD, not with lumbago worsening over past week.  UA wnl, upreg negative.  Likely related to lumbar pathology, much less likely a gynecologic problem.  Pt amenable to percocet and f/u.  Has appt with neurology in January and with PMD tomorrow.  D/c with percocet and pmd f/u.        Kathreen Cosier, MD 11/09/11 1301

## 2011-11-09 NOTE — ED Notes (Signed)
Pt c/o left sided lower back pain with radiation to LLQ pain; pt sts back pain x several months with radiation x 3 days; pt denies vaginal discharge or UTI sx

## 2011-11-09 NOTE — ED Notes (Signed)
MD at bedside. 

## 2011-12-25 ENCOUNTER — Other Ambulatory Visit: Payer: Self-pay | Admitting: Diagnostic Neuroimaging

## 2011-12-25 DIAGNOSIS — M79609 Pain in unspecified limb: Secondary | ICD-10-CM

## 2011-12-25 DIAGNOSIS — M545 Low back pain: Secondary | ICD-10-CM

## 2011-12-30 ENCOUNTER — Ambulatory Visit: Payer: Medicaid Other | Attending: Diagnostic Neuroimaging | Admitting: *Deleted

## 2011-12-30 DIAGNOSIS — M2550 Pain in unspecified joint: Secondary | ICD-10-CM | POA: Insufficient documentation

## 2011-12-30 DIAGNOSIS — R262 Difficulty in walking, not elsewhere classified: Secondary | ICD-10-CM | POA: Insufficient documentation

## 2011-12-30 DIAGNOSIS — IMO0001 Reserved for inherently not codable concepts without codable children: Secondary | ICD-10-CM | POA: Insufficient documentation

## 2011-12-30 DIAGNOSIS — M25569 Pain in unspecified knee: Secondary | ICD-10-CM | POA: Insufficient documentation

## 2011-12-30 DIAGNOSIS — M25559 Pain in unspecified hip: Secondary | ICD-10-CM | POA: Insufficient documentation

## 2011-12-30 DIAGNOSIS — M2560 Stiffness of unspecified joint, not elsewhere classified: Secondary | ICD-10-CM | POA: Insufficient documentation

## 2011-12-31 ENCOUNTER — Ambulatory Visit
Admission: RE | Admit: 2011-12-31 | Discharge: 2011-12-31 | Disposition: A | Discharge status: Home / Self Care | Payer: Medicaid Other | Source: Ambulatory Visit | Attending: Diagnostic Neuroimaging | Admitting: Diagnostic Neuroimaging

## 2011-12-31 ENCOUNTER — Other Ambulatory Visit: Payer: Medicaid Other

## 2011-12-31 DIAGNOSIS — M79609 Pain in unspecified limb: Secondary | ICD-10-CM

## 2011-12-31 DIAGNOSIS — M545 Low back pain: Secondary | ICD-10-CM

## 2012-01-06 ENCOUNTER — Ambulatory Visit: Payer: Medicaid Other | Admitting: *Deleted

## 2012-01-08 ENCOUNTER — Ambulatory Visit: Payer: Medicaid Other | Admitting: *Deleted

## 2012-01-13 ENCOUNTER — Other Ambulatory Visit: Payer: Self-pay | Admitting: Family Medicine

## 2012-01-13 ENCOUNTER — Ambulatory Visit: Payer: Medicaid Other | Admitting: *Deleted

## 2012-01-20 ENCOUNTER — Ambulatory Visit: Payer: Medicaid Other | Admitting: *Deleted

## 2012-04-21 ENCOUNTER — Other Ambulatory Visit: Payer: Self-pay | Admitting: Orthopedic Surgery

## 2012-04-21 DIAGNOSIS — R52 Pain, unspecified: Secondary | ICD-10-CM

## 2012-04-22 ENCOUNTER — Ambulatory Visit
Admission: RE | Admit: 2012-04-22 | Discharge: 2012-04-22 | Disposition: A | Discharge status: Home / Self Care | Payer: Medicaid Other | Source: Ambulatory Visit | Attending: Orthopedic Surgery | Admitting: Orthopedic Surgery

## 2012-04-22 ENCOUNTER — Ambulatory Visit: Payer: Medicaid Other | Admitting: Physical Therapy

## 2012-04-22 DIAGNOSIS — R52 Pain, unspecified: Secondary | ICD-10-CM

## 2012-04-27 ENCOUNTER — Ambulatory Visit: Payer: Medicaid Other | Attending: Orthopedic Surgery | Admitting: Rehabilitation

## 2012-06-28 ENCOUNTER — Other Ambulatory Visit (HOSPITAL_COMMUNITY): Payer: Self-pay | Admitting: Internal Medicine

## 2012-06-28 DIAGNOSIS — Z1231 Encounter for screening mammogram for malignant neoplasm of breast: Secondary | ICD-10-CM

## 2012-07-12 ENCOUNTER — Ambulatory Visit (HOSPITAL_COMMUNITY)
Admission: RE | Admit: 2012-07-12 | Discharge: 2012-07-12 | Disposition: A | Discharge status: Home / Self Care | Payer: Medicaid Other | Source: Ambulatory Visit | Attending: Internal Medicine | Admitting: Internal Medicine

## 2012-07-12 DIAGNOSIS — Z1231 Encounter for screening mammogram for malignant neoplasm of breast: Secondary | ICD-10-CM | POA: Insufficient documentation

## 2012-11-09 ENCOUNTER — Emergency Department (HOSPITAL_COMMUNITY)
Admission: EM | Admit: 2012-11-09 | Discharge: 2012-11-09 | Disposition: A | Discharge status: Home / Self Care | Payer: Medicaid Other | Attending: Emergency Medicine | Admitting: Emergency Medicine

## 2012-11-09 ENCOUNTER — Encounter (HOSPITAL_COMMUNITY): Payer: Self-pay

## 2012-11-09 ENCOUNTER — Emergency Department (HOSPITAL_COMMUNITY): Payer: Medicaid Other

## 2012-11-09 DIAGNOSIS — R05 Cough: Secondary | ICD-10-CM | POA: Insufficient documentation

## 2012-11-09 DIAGNOSIS — I10 Essential (primary) hypertension: Secondary | ICD-10-CM | POA: Insufficient documentation

## 2012-11-09 DIAGNOSIS — F411 Generalized anxiety disorder: Secondary | ICD-10-CM | POA: Insufficient documentation

## 2012-11-09 DIAGNOSIS — R059 Cough, unspecified: Secondary | ICD-10-CM | POA: Insufficient documentation

## 2012-11-09 DIAGNOSIS — G8929 Other chronic pain: Secondary | ICD-10-CM | POA: Insufficient documentation

## 2012-11-09 DIAGNOSIS — E119 Type 2 diabetes mellitus without complications: Secondary | ICD-10-CM | POA: Insufficient documentation

## 2012-11-09 DIAGNOSIS — M109 Gout, unspecified: Secondary | ICD-10-CM | POA: Insufficient documentation

## 2012-11-09 DIAGNOSIS — F17200 Nicotine dependence, unspecified, uncomplicated: Secondary | ICD-10-CM | POA: Insufficient documentation

## 2012-11-09 DIAGNOSIS — Z79899 Other long term (current) drug therapy: Secondary | ICD-10-CM | POA: Insufficient documentation

## 2012-11-09 DIAGNOSIS — R079 Chest pain, unspecified: Secondary | ICD-10-CM | POA: Insufficient documentation

## 2012-11-09 HISTORY — DX: Anxiety disorder, unspecified: F41.9

## 2012-11-09 LAB — RAPID URINE DRUG SCREEN, HOSP PERFORMED
Amphetamines: NOT DETECTED
Barbiturates: NOT DETECTED
Benzodiazepines: NOT DETECTED
Cocaine: NOT DETECTED
Opiates: NOT DETECTED
Tetrahydrocannabinol: NOT DETECTED

## 2012-11-09 LAB — CBC
HCT: 36.6 % (ref 36.0–46.0)
Hemoglobin: 11.8 g/dL — ABNORMAL LOW (ref 12.0–15.0)
MCH: 23.5 pg — ABNORMAL LOW (ref 26.0–34.0)
MCHC: 32.2 g/dL (ref 30.0–36.0)
MCV: 72.9 fL — ABNORMAL LOW (ref 78.0–100.0)
Platelets: 400 K/uL (ref 150–400)
RBC: 5.02 MIL/uL (ref 3.87–5.11)
RDW: 15.7 % — ABNORMAL HIGH (ref 11.5–15.5)
WBC: 8.4 K/uL (ref 4.0–10.5)

## 2012-11-09 LAB — COMPREHENSIVE METABOLIC PANEL
Albumin: 3.4 g/dL — ABNORMAL LOW (ref 3.5–5.2)
BUN: 9 mg/dL (ref 6–23)
Calcium: 9.4 mg/dL (ref 8.4–10.5)
Creatinine, Ser: 0.72 mg/dL (ref 0.50–1.10)
GFR calc Af Amer: >90 mL/min (ref >90)
Glucose, Bld: 160 mg/dL — ABNORMAL HIGH (ref 70–99)
Total Protein: 7.6 g/dL (ref 6.0–8.3)

## 2012-11-09 LAB — TROPONIN I
Troponin I: <0.3 ng/mL (ref <0.30)
Troponin I: <0.3 ng/mL

## 2012-11-09 MED ORDER — ALPRAZOLAM 0.25 MG PO TABS
0.5000 mg | ORAL_TABLET | Freq: Once | ORAL | Status: AC
  Administered 2012-11-09: 0.5 mg via ORAL
  Filled 2012-11-09: qty 2

## 2012-11-09 NOTE — ED Notes (Signed)
The patient states that she cannot move her left arm at all.  She does not move the arm at all when I ask her to do so, but she does move the arm while gesturing and while sliding down the bed to walk to the restroom.

## 2012-11-09 NOTE — ED Notes (Signed)
The patient called EMS because she started feeling chest pain radiating to her left arm, back, and neck starting at 0020.  EMS gave nitro-stat 0.4 mg, po x3 which lowered the patient's pain from 7 to 5, and they gave ASA 324 mg, po.  Per EMS, the patient has a history for anxiety and diabetes mellitus.
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
## 2012-11-09 NOTE — ED Notes (Signed)
Attempted to wake pt for uds but unable.  VS stable.  Dr Denton Lank stated to let pt sleep until troponin is drawn.

## 2013-02-21 ENCOUNTER — Other Ambulatory Visit (HOSPITAL_COMMUNITY): Payer: Self-pay | Admitting: Internal Medicine

## 2013-02-21 DIAGNOSIS — Z1231 Encounter for screening mammogram for malignant neoplasm of breast: Secondary | ICD-10-CM

## 2013-07-13 ENCOUNTER — Ambulatory Visit (HOSPITAL_COMMUNITY)
Admission: RE | Admit: 2013-07-13 | Discharge: 2013-07-13 | Disposition: A | Discharge status: Home / Self Care | Payer: Medicare Other | Source: Ambulatory Visit | Attending: Internal Medicine | Admitting: Internal Medicine

## 2013-07-13 DIAGNOSIS — Z1231 Encounter for screening mammogram for malignant neoplasm of breast: Secondary | ICD-10-CM

## 2014-03-25 ENCOUNTER — Emergency Department (HOSPITAL_COMMUNITY): Payer: Medicare Other

## 2014-03-25 ENCOUNTER — Encounter (HOSPITAL_COMMUNITY): Payer: Self-pay | Admitting: Emergency Medicine

## 2014-03-25 ENCOUNTER — Emergency Department (HOSPITAL_COMMUNITY)
Admission: EM | Admit: 2014-03-25 | Discharge: 2014-03-25 | Disposition: A | Discharge status: Home / Self Care | Payer: Medicare Other | Attending: Emergency Medicine | Admitting: Emergency Medicine

## 2014-03-25 DIAGNOSIS — R0602 Shortness of breath: Secondary | ICD-10-CM | POA: Insufficient documentation

## 2014-03-25 DIAGNOSIS — F17200 Nicotine dependence, unspecified, uncomplicated: Secondary | ICD-10-CM | POA: Insufficient documentation

## 2014-03-25 DIAGNOSIS — R079 Chest pain, unspecified: Secondary | ICD-10-CM

## 2014-03-25 DIAGNOSIS — R5381 Other malaise: Secondary | ICD-10-CM | POA: Insufficient documentation

## 2014-03-25 DIAGNOSIS — R0789 Other chest pain: Secondary | ICD-10-CM | POA: Insufficient documentation

## 2014-03-25 DIAGNOSIS — M109 Gout, unspecified: Secondary | ICD-10-CM | POA: Insufficient documentation

## 2014-03-25 DIAGNOSIS — G8929 Other chronic pain: Secondary | ICD-10-CM | POA: Insufficient documentation

## 2014-03-25 DIAGNOSIS — F411 Generalized anxiety disorder: Secondary | ICD-10-CM | POA: Insufficient documentation

## 2014-03-25 DIAGNOSIS — R42 Dizziness and giddiness: Secondary | ICD-10-CM | POA: Insufficient documentation

## 2014-03-25 DIAGNOSIS — E119 Type 2 diabetes mellitus without complications: Secondary | ICD-10-CM | POA: Insufficient documentation

## 2014-03-25 DIAGNOSIS — R05 Cough: Secondary | ICD-10-CM | POA: Insufficient documentation

## 2014-03-25 DIAGNOSIS — R5383 Other fatigue: Secondary | ICD-10-CM

## 2014-03-25 DIAGNOSIS — Z79899 Other long term (current) drug therapy: Secondary | ICD-10-CM | POA: Insufficient documentation

## 2014-03-25 DIAGNOSIS — I10 Essential (primary) hypertension: Secondary | ICD-10-CM | POA: Insufficient documentation

## 2014-03-25 DIAGNOSIS — R059 Cough, unspecified: Secondary | ICD-10-CM | POA: Insufficient documentation

## 2014-03-25 DIAGNOSIS — F430 Acute stress reaction: Secondary | ICD-10-CM | POA: Insufficient documentation

## 2014-03-25 LAB — BASIC METABOLIC PANEL
BUN: 13 mg/dL (ref 6–23)
CALCIUM: 9.4 mg/dL (ref 8.4–10.5)
CO2: 23 meq/L (ref 19–32)
Chloride: 101 mEq/L (ref 96–112)
Creatinine, Ser: 0.75 mg/dL (ref 0.50–1.10)
GFR calc Af Amer: >90 mL/min (ref >90)
Glucose, Bld: 120 mg/dL — ABNORMAL HIGH (ref 70–99)
Potassium: 4.7 mEq/L (ref 3.7–5.3)
SODIUM: 141 meq/L (ref 137–147)

## 2014-03-25 LAB — CBC
HCT: 37.2 % (ref 36.0–46.0)
Hemoglobin: 12.2 g/dL (ref 12.0–15.0)
MCH: 23.2 pg — AB (ref 26.0–34.0)
MCHC: 32.8 g/dL (ref 30.0–36.0)
MCV: 70.7 fL — ABNORMAL LOW (ref 78.0–100.0)
PLATELETS: 399 10*3/uL (ref 150–400)
RBC: 5.26 MIL/uL — AB (ref 3.87–5.11)
RDW: 15.6 % — ABNORMAL HIGH (ref 11.5–15.5)
WBC: 10.9 10*3/uL — ABNORMAL HIGH (ref 4.0–10.5)

## 2014-03-25 LAB — CBG MONITORING, ED: Glucose-Capillary: 125 mg/dL — ABNORMAL HIGH (ref 70–99)

## 2014-03-25 LAB — I-STAT TROPONIN, ED
TROPONIN I, POC: 0 ng/mL (ref 0.00–0.08)
Troponin i, poc: 0 ng/mL (ref 0.00–0.08)

## 2014-03-25 MED ORDER — ASPIRIN 81 MG PO CHEW: 81.0000 mg | CHEWABLE_TABLET | Freq: Every day | ORAL | Status: DC

## 2014-03-25 MED ORDER — ONDANSETRON HCL 4 MG/2ML IJ SOLN
4.0000 mg | Freq: Once | INTRAMUSCULAR | Status: AC
  Administered 2014-03-25: 4 mg via INTRAVENOUS
  Filled 2014-03-25: qty 2

## 2014-03-25 MED ORDER — ASPIRIN 81 MG PO CHEW
324.0000 mg | CHEWABLE_TABLET | Freq: Once | ORAL | Status: AC
  Administered 2014-03-25: 324 mg via ORAL
  Filled 2014-03-25: qty 4

## 2014-03-25 MED ORDER — KETOROLAC TROMETHAMINE 30 MG/ML IJ SOLN
30.0000 mg | Freq: Once | INTRAMUSCULAR | Status: AC
  Administered 2014-03-25: 30 mg via INTRAVENOUS
  Filled 2014-03-25: qty 1

## 2014-03-25 MED ORDER — MORPHINE SULFATE 4 MG/ML IJ SOLN
4.0000 mg | Freq: Once | INTRAMUSCULAR | Status: AC
  Administered 2014-03-25: 4 mg via INTRAVENOUS
  Filled 2014-03-25: qty 1

## 2014-03-25 NOTE — ED Notes (Signed)
Per Lonna Cobb PA pt can take PRN xanax. Pt updated.

## 2014-03-25 NOTE — ED Notes (Signed)
Pt reporting  Central cp x 1 week on and off also with dizziness. Denies cardiac hx. Denies n/v reports is SOB. Pt is a x 4. States blood sugar has been running high.

## 2014-03-25 NOTE — Discharge Instructions (Signed)
Your lab work, chest x-ray, EKG all normal today. Start taking baby aspirin every day as prescribed. Followup with primary care doctor. You will be called regarding your appointment with a cardiologist. Please followup with them as soon as possible. Return if your symptoms are worsening.   Chest Pain (Nonspecific) It is often hard to give a specific diagnosis for the cause of chest pain. There is always a chance that your pain could be related to something serious, such as a heart attack or a blood clot in the lungs. You need to follow up with your caregiver for further evaluation. CAUSES   Heartburn.  Pneumonia or bronchitis.  Anxiety or stress.  Inflammation around your heart (pericarditis) or lung (pleuritis or pleurisy).  A blood clot in the lung.  A collapsed lung (pneumothorax). It can develop suddenly on its own (spontaneous pneumothorax) or from injury (trauma) to the chest.  Shingles infection (herpes zoster virus). The chest wall is composed of bones, muscles, and cartilage. Any of these can be the source of the pain.  The bones can be bruised by injury.  The muscles or cartilage can be strained by coughing or overwork.  The cartilage can be affected by inflammation and become sore (costochondritis). DIAGNOSIS  Lab tests or other studies, such as X-rays, electrocardiography, stress testing, or cardiac imaging, may be needed to find the cause of your pain.  TREATMENT   Treatment depends on what may be causing your chest pain. Treatment may include:  Acid blockers for heartburn.  Anti-inflammatory medicine.  Pain medicine for inflammatory conditions.  Antibiotics if an infection is present.  You may be advised to change lifestyle habits. This includes stopping smoking and avoiding alcohol, caffeine, and chocolate.  You may be advised to keep your head raised (elevated) when sleeping. This reduces the chance of acid going backward from your stomach into your  esophagus.  Most of the time, nonspecific chest pain will improve within 2 to 3 days with rest and mild pain medicine. HOME CARE INSTRUCTIONS   If antibiotics were prescribed, take your antibiotics as directed. Finish them even if you start to feel better.  For the next few days, avoid physical activities that bring on chest pain. Continue physical activities as directed.  Do not smoke.  Avoid drinking alcohol.  Only take over-the-counter or prescription medicine for pain, discomfort, or fever as directed by your caregiver.  Follow your caregiver's suggestions for further testing if your chest pain does not go away.  Keep any follow-up appointments you made. If you do not go to an appointment, you could develop lasting (chronic) problems with pain. If there is any problem keeping an appointment, you must call to reschedule. SEEK MEDICAL CARE IF:   You think you are having problems from the medicine you are taking. Read your medicine instructions carefully.  Your chest pain does not go away, even after treatment.  You develop a rash with blisters on your chest. SEEK IMMEDIATE MEDICAL CARE IF:   You have increased chest pain or pain that spreads to your arm, neck, jaw, back, or abdomen.  You develop shortness of breath, an increasing cough, or you are coughing up blood.  You have severe back or abdominal pain, feel nauseous, or vomit.  You develop severe weakness, fainting, or chills.  You have a fever. THIS IS AN EMERGENCY. Do not wait to see if the pain will go away. Get medical help at once. Call your local emergency services (911 in [place] Do  not drive yourself to the hospital. MAKE SURE YOU:   Understand these instructions.  Will watch your condition.  Will get help right away if you are not doing well or get worse. Document Released: 08/20/2005 Document Revised: 02/02/2012 Document Reviewed: 06/15/2008 [facility] Patient Information 2014 [employer].

## 2014-03-25 NOTE — ED Notes (Signed)
CBG 125 °

## 2014-03-25 NOTE — ED Provider Notes (Signed)
History/physical exam/procedure(s) were performed by non-physician practitioner and as supervising physician I was immediately available for consultation/collaboration. I have reviewed all notes and am in agreement with care and plan.   Shaune Pollack, MD 03/25/14 [phone]
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
## 2014-04-25 ENCOUNTER — Other Ambulatory Visit: Payer: Self-pay | Admitting: *Deleted

## 2014-04-25 ENCOUNTER — Ambulatory Visit (INDEPENDENT_AMBULATORY_CARE_PROVIDER_SITE_OTHER): Payer: Medicare Other | Admitting: Cardiology

## 2014-04-25 ENCOUNTER — Emergency Department (HOSPITAL_COMMUNITY): Payer: Medicare Other

## 2014-04-25 ENCOUNTER — Observation Stay (HOSPITAL_COMMUNITY)
Admission: EM | Admit: 2014-04-25 | Discharge: 2014-04-26 | Disposition: A | Discharge status: Home / Self Care | Payer: Medicare Other | Attending: Internal Medicine | Admitting: Internal Medicine

## 2014-04-25 ENCOUNTER — Encounter (HOSPITAL_COMMUNITY): Payer: Self-pay | Admitting: Emergency Medicine

## 2014-04-25 ENCOUNTER — Encounter: Payer: Self-pay | Admitting: Cardiology

## 2014-04-25 VITALS — BP 170/58 | HR 68 | Ht 63.0 in | Wt 129.0 lb

## 2014-04-25 DIAGNOSIS — R0989 Other specified symptoms and signs involving the circulatory and respiratory systems: Secondary | ICD-10-CM

## 2014-04-25 DIAGNOSIS — Z79899 Other long term (current) drug therapy: Secondary | ICD-10-CM | POA: Insufficient documentation

## 2014-04-25 DIAGNOSIS — G8929 Other chronic pain: Secondary | ICD-10-CM | POA: Insufficient documentation

## 2014-04-25 DIAGNOSIS — D509 Iron deficiency anemia, unspecified: Secondary | ICD-10-CM

## 2014-04-25 DIAGNOSIS — R079 Chest pain, unspecified: Principal | ICD-10-CM | POA: Insufficient documentation

## 2014-04-25 DIAGNOSIS — R0600 Dyspnea, unspecified: Secondary | ICD-10-CM | POA: Insufficient documentation

## 2014-04-25 DIAGNOSIS — E785 Hyperlipidemia, unspecified: Secondary | ICD-10-CM | POA: Insufficient documentation

## 2014-04-25 DIAGNOSIS — K219 Gastro-esophageal reflux disease without esophagitis: Secondary | ICD-10-CM

## 2014-04-25 DIAGNOSIS — I10 Essential (primary) hypertension: Secondary | ICD-10-CM

## 2014-04-25 DIAGNOSIS — Z7982 Long term (current) use of aspirin: Secondary | ICD-10-CM | POA: Insufficient documentation

## 2014-04-25 DIAGNOSIS — E119 Type 2 diabetes mellitus without complications: Secondary | ICD-10-CM

## 2014-04-25 DIAGNOSIS — R7989 Other specified abnormal findings of blood chemistry: Secondary | ICD-10-CM

## 2014-04-25 DIAGNOSIS — Z791 Long term (current) use of non-steroidal anti-inflammatories (NSAID): Secondary | ICD-10-CM | POA: Insufficient documentation

## 2014-04-25 DIAGNOSIS — F17200 Nicotine dependence, unspecified, uncomplicated: Secondary | ICD-10-CM | POA: Insufficient documentation

## 2014-04-25 DIAGNOSIS — M109 Gout, unspecified: Secondary | ICD-10-CM | POA: Insufficient documentation

## 2014-04-25 DIAGNOSIS — R0602 Shortness of breath: Secondary | ICD-10-CM

## 2014-04-25 DIAGNOSIS — E118 Type 2 diabetes mellitus with unspecified complications: Secondary | ICD-10-CM

## 2014-04-25 DIAGNOSIS — F411 Generalized anxiety disorder: Secondary | ICD-10-CM | POA: Insufficient documentation

## 2014-04-25 LAB — CBC WITH DIFFERENTIAL/PLATELET
Basophils Absolute: 0 10*3/uL (ref 0.0–0.1)
Basophils Relative: 0 % (ref 0–1)
EOS ABS: 0.2 10*3/uL (ref 0.0–0.7)
Eosinophils Relative: 2 % (ref 0–5)
HCT: 36.3 % (ref 36.0–46.0)
HEMOGLOBIN: 11.7 g/dL — AB (ref 12.0–15.0)
LYMPHS ABS: 4.3 10*3/uL — AB (ref 0.7–4.0)
Lymphocytes Relative: 43 % (ref 12–46)
MCH: 23 pg — AB (ref 26.0–34.0)
MCHC: 32.2 g/dL (ref 30.0–36.0)
MCV: 71.3 fL — ABNORMAL LOW (ref 78.0–100.0)
MONOS PCT: 7 % (ref 3–12)
Monocytes Absolute: 0.7 10*3/uL (ref 0.1–1.0)
Neutro Abs: 4.6 10*3/uL (ref 1.7–7.7)
Neutrophils Relative %: 48 % (ref 43–77)
PLATELETS: 338 10*3/uL (ref 150–400)
RBC: 5.09 MIL/uL (ref 3.87–5.11)
RDW: 16.3 % — ABNORMAL HIGH (ref 11.5–15.5)
WBC: 9.8 10*3/uL (ref 4.0–10.5)

## 2014-04-25 LAB — COMPREHENSIVE METABOLIC PANEL
ALT: 22 U/L (ref 0–35)
AST: 26 U/L (ref 0–37)
Albumin: 3.4 g/dL — ABNORMAL LOW (ref 3.5–5.2)
Alkaline Phosphatase: 108 U/L (ref 39–117)
BUN: 20 mg/dL (ref 6–23)
CALCIUM: 9.4 mg/dL (ref 8.4–10.5)
CO2: 21 mEq/L (ref 19–32)
Chloride: 100 mEq/L (ref 96–112)
Creatinine, Ser: 0.83 mg/dL (ref 0.50–1.10)
GFR calc non Af Amer: 77 mL/min — ABNORMAL LOW (ref >90)
GFR, EST AFRICAN AMERICAN: 89 mL/min — AB (ref >90)
GLUCOSE: 170 mg/dL — AB (ref 70–99)
Potassium: 4.1 mEq/L (ref 3.7–5.3)
Sodium: 137 mEq/L (ref 137–147)
Total Bilirubin: <0.2 mg/dL — ABNORMAL LOW (ref 0.3–1.2)
Total Protein: 7.9 g/dL (ref 6.0–8.3)

## 2014-04-25 LAB — TROPONIN I: Troponin I: <0.3 ng/mL (ref <0.30)

## 2014-04-25 LAB — CK TOTAL AND CKMB (NOT AT ARMC)
CK TOTAL: 177 U/L (ref 7–177)
CK, MB: 3.4 ng/mL (ref 0.3–4.0)
Relative Index: 1.9 (ref 0.0–4.0)

## 2014-04-25 LAB — D-DIMER, QUANTITATIVE (NOT AT ARMC): D-Dimer, Quant: 0.63 ug/mL-FEU — ABNORMAL HIGH (ref 0.00–0.48)

## 2014-04-25 MED ORDER — LISINOPRIL 10 MG PO TABS: 10.0000 mg | ORAL_TABLET | Freq: Every day | ORAL | Status: DC

## 2014-04-25 MED ORDER — IOHEXOL 300 MG/ML  SOLN: 100.0000 mL | Freq: Once | INTRAMUSCULAR | Status: DC | PRN

## 2014-04-25 MED ORDER — IOHEXOL 350 MG/ML SOLN
100.0000 mL | Freq: Once | INTRAVENOUS | Status: AC | PRN
  Administered 2014-04-25: 100 mL via INTRAVENOUS

## 2014-04-25 MED ORDER — SODIUM CHLORIDE 0.9 % IV SOLN
INTRAVENOUS | Status: DC
  Administered 2014-04-25: 21:00:00 via INTRAVENOUS

## 2014-04-25 NOTE — ED Notes (Signed)
Pt returned from CT °

## 2014-04-25 NOTE — ED Notes (Signed)
Patient transported to CT 

## 2014-04-25 NOTE — ED Notes (Signed)
CT notified pt has IV 
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
## 2014-04-25 NOTE — Patient Instructions (Signed)
Your physician has requested that you have a carotid duplex. This test is an ultrasound of the carotid arteries in your neck. It looks at blood flow through these arteries that supply the brain with blood. Allow one hour for this exam. There are no restrictions or special instructions.  Your physician has requested that you have an echocardiogram. Echocardiography is a painless test that uses sound waves to create images of your heart. It provides your doctor with information about the size and shape of your heart and how well your heart's chambers and valves are working. This procedure takes approximately one hour. There are no restrictions for this procedure.  Your physician has requested that you have en exercise stress myoview. For further information please visit [email or web address]. Please follow instruction sheet, as given.  Your physician recommends that you return for lab work in:  1.)TODAY (DDIMER, TROPININ, CPKMB) 2.) IN ONE WEEK (BMET)  Your physician has recommended you make the following change in your medication: INCREASE LISINOPRIL TO 10 MG ONCE A DAY Your physician recommends that you schedule a follow-up appointment in:  1.) ONE WEEK WITH NURSE FOR BLOOD PRESSURE CHECK (AND LAB) 2.) 1-2 WEEKS AFTER ALL TESTING IS COMPLETED.

## 2014-04-25 NOTE — ED Notes (Signed)
Presents from Dr. Theodosia Blender office with 3 weeks of chest pain and SOB. Pain is worse with deep breathing and described as heaviness.  LAbs done today showed elevated D-dimer. Sent here for PE work up.

## 2014-04-25 NOTE — ED Provider Notes (Signed)
CSN: [id number]     Arrival date & time 04/25/14  1905 History   First MD Initiated Contact with Patient 04/25/14 2103     Chief Complaint  Patient presents with  . Abnormal Lab    elevated D-dimer-sent from Dr. Fransico Him     (Consider location/radiation/quality/duration/timing/severity/associated sxs/prior Treatment) The history is provided by the patient.   Pt with 3 weeks of chest pain that radiates to her right neck--sx worse with stress but also with exertion--better with rest--dyspnea and diaphoresis at times--saw cards (dr. Radford Pax) today and had blood work with + d-dimer but nl ekg and troponin--pt called and here now for futher eval--she admits to right leg pain and swelling for x 24hrs--sx persistent and no new tx used pta Past Medical History  Diagnosis Date  . Diabetes mellitus   . Gout   . Chronic back pain   . Anxiety   . Hypertension   . Hyperlipidemia    Past Surgical History  Procedure Laterality Date  . Lymph gland excision    . Cervical fusion    . Cesarean section    . Shoulder surgery      left side   Family History  Problem Relation Age of Onset  . Diabetes Mother   . Heart disease Mother   . Kidney failure Mother   . Diabetes Sister    History  Substance Use Topics  . Smoking status: Current Every Day Smoker -- 0.20 packs/day for 40 years  . Smokeless tobacco: Former Systems developer    Types: Snuff, Chew    Quit date: 11/09/1966  . Alcohol Use: 1.8 oz/week    3 Cans of beer per week   OB History   Grav Para Term Preterm Abortions TAB SAB Ect Mult Living                 Review of Systems  All other systems reviewed and are negative.     Allergies  Review of patient's allergies indicates no known allergies.  Home Medications   Prior to Admission medications   Medication Sig Start Date End Date Taking? Authorizing Provider  allopurinol (ZYLOPRIM) 100 MG tablet Take 100 mg by mouth daily.     Yes Historical Provider, MD  ALPRAZolam Duanne Moron)  0.5 MG tablet Take 0.5 mg by mouth 3 (three) times daily as needed.    Yes Historical Provider, MD  amLODipine (NORVASC) 10 MG tablet Take 10 mg by mouth daily.  03/08/14  Yes Historical Provider, MD  aspirin 81 MG chewable tablet Chew 1 tablet (81 mg total) by mouth daily. 03/25/14  Yes Tatyana A Kirichenko, PA-C  CRESTOR 20 MG tablet Take 20 mg by mouth every evening.  03/08/14  Yes Historical Provider, MD  glimepiride (AMARYL) 2 MG tablet Take 2 mg by mouth every evening.  03/08/14  Yes Historical Provider, MD  HYDROcodone-acetaminophen (VICODIN) 5-500 MG per tablet Take 1 tablet by mouth every 8 (eight) hours as needed. For pain.   Yes Historical Provider, MD  lisinopril (PRINIVIL,ZESTRIL) 10 MG tablet Take 1 tablet (10 mg total) by mouth daily. 04/25/14  Yes Sueanne Margarita, MD  metFORMIN (GLUCOPHAGE) 1000 MG tablet Take 1,000 mg by mouth 2 (two) times daily with a meal.   Yes Historical Provider, MD  naproxen (NAPROSYN) 500 MG tablet Take 500 mg by mouth 2 (two) times daily after a meal.  02/09/14  Yes Historical Provider, MD   BP 162/66  Pulse 56  Temp(Src) 98 F (36.7 C) (  Oral)  Resp 16  Wt 130 lb 3 oz (59.053 kg)  SpO2 99% Physical Exam  Nursing note and vitals reviewed. Constitutional: She is oriented to person, place, and time. She appears well-developed and well-nourished.  Non-toxic appearance. No distress.  HENT:  Head: Normocephalic and atraumatic.  Eyes: Conjunctivae, EOM and lids are normal. Pupils are equal, round, and reactive to light.  Neck: Normal range of motion. Neck supple. No tracheal deviation present. No mass present.  Cardiovascular: Normal rate, regular rhythm and normal heart sounds.  Exam reveals no gallop.   No murmur heard. Pulmonary/Chest: Effort normal and breath sounds normal. No stridor. No respiratory distress. She has no decreased breath sounds. She has no wheezes. She has no rhonchi. She has no rales.  Abdominal: Soft. Normal appearance and bowel sounds are  normal. She exhibits no distension. There is no tenderness. There is no rebound and no CVA tenderness.  Musculoskeletal: Normal range of motion. She exhibits no edema and no tenderness.       Legs: Neurological: She is alert and oriented to person, place, and time. She has normal strength. No cranial nerve deficit or sensory deficit. GCS eye subscore is 4. GCS verbal subscore is 5. GCS motor subscore is 6.  Skin: Skin is warm and dry. No abrasion and no rash noted.  Psychiatric: She has a normal mood and affect. Her speech is normal and behavior is normal.    ED Course  Procedures (including critical care time) Labs Review Labs Reviewed  CBC WITH DIFFERENTIAL - Abnormal; Notable for the following:    Hemoglobin 11.7 (*)    MCV 71.3 (*)    MCH 23.0 (*)    RDW 16.3 (*)    Lymphs Abs 4.3 (*)    All other components within normal limits  COMPREHENSIVE METABOLIC PANEL  TROPONIN I    Imaging Review No results found.   EKG Interpretation   Date/Time:  Tuesday April 25 2014 19:52:29 EDT Ventricular Rate:  55 PR Interval:  158 QRS Duration: 76 QT Interval:  414 QTC Calculation: 396 R Axis:   69 Text Interpretation:  Sinus bradycardia Otherwise normal ECG Confirmed by  Venezia Sargeant  MD, Zenith Lamphier ([id number]) on 04/25/2014 8:40:16 PM      MDM   Final diagnoses:  None    Patient with negative chest CT for PE here. Will be admitted for further evaluation of her chest pain.    Leota Jacobsen, MD 04/25/14 [phone]

## 2014-04-26 DIAGNOSIS — D509 Iron deficiency anemia, unspecified: Secondary | ICD-10-CM

## 2014-04-26 DIAGNOSIS — E119 Type 2 diabetes mellitus without complications: Secondary | ICD-10-CM

## 2014-04-26 DIAGNOSIS — R079 Chest pain, unspecified: Secondary | ICD-10-CM

## 2014-04-26 DIAGNOSIS — M109 Gout, unspecified: Secondary | ICD-10-CM

## 2014-04-26 DIAGNOSIS — R0989 Other specified symptoms and signs involving the circulatory and respiratory systems: Secondary | ICD-10-CM

## 2014-04-26 LAB — TROPONIN I: Troponin I: <0.3 ng/mL (ref <0.30)

## 2014-04-26 LAB — HEMOGLOBIN A1C
Hgb A1c MFr Bld: 7.4 % — ABNORMAL HIGH (ref <5.7)
MEAN PLASMA GLUCOSE: 166 mg/dL — AB (ref <117)

## 2014-04-26 MED ORDER — NITROGLYCERIN 0.4 MG SL SUBL: 0.4000 mg | SUBLINGUAL_TABLET | SUBLINGUAL | Status: DC | PRN

## 2014-04-26 MED ORDER — ALLOPURINOL 100 MG PO TABS
100.0000 mg | ORAL_TABLET | Freq: Every day | ORAL | Status: DC
  Administered 2014-04-26: 100 mg via ORAL
  Filled 2014-04-26: qty 1

## 2014-04-26 MED ORDER — ASPIRIN EC 325 MG PO TBEC
325.0000 mg | DELAYED_RELEASE_TABLET | Freq: Every day | ORAL | Status: DC
  Administered 2014-04-26: 325 mg via ORAL
  Filled 2014-04-26: qty 1

## 2014-04-26 MED ORDER — NITROGLYCERIN 2 % TD OINT
0.5000 [in_us] | TOPICAL_OINTMENT | Freq: Three times a day (TID) | TRANSDERMAL | Status: DC
  Administered 2014-04-26: 0.5 [in_us] via TOPICAL
  Filled 2014-04-26: qty 30

## 2014-04-26 MED ORDER — PANTOPRAZOLE SODIUM 40 MG PO TBEC
40.0000 mg | DELAYED_RELEASE_TABLET | Freq: Every day | ORAL | Status: DC
  Administered 2014-04-26: 40 mg via ORAL
  Filled 2014-04-26: qty 1

## 2014-04-26 MED ORDER — HYDROCODONE-ACETAMINOPHEN 5-325 MG PO TABS
1.0000 | ORAL_TABLET | Freq: Four times a day (QID) | ORAL | Status: DC | PRN
  Administered 2014-04-26: 1 via ORAL
  Filled 2014-04-26: qty 1

## 2014-04-26 MED ORDER — INSULIN ASPART 100 UNIT/ML ~~LOC~~ SOLN: 0.0000 [IU] | Freq: Every day | SUBCUTANEOUS | Status: DC

## 2014-04-26 MED ORDER — INSULIN ASPART 100 UNIT/ML ~~LOC~~ SOLN: 0.0000 [IU] | Freq: Three times a day (TID) | SUBCUTANEOUS | Status: DC

## 2014-04-26 MED ORDER — ENOXAPARIN SODIUM 40 MG/0.4ML ~~LOC~~ SOLN
40.0000 mg | SUBCUTANEOUS | Status: DC
  Filled 2014-04-26: qty 0.4

## 2014-04-26 MED ORDER — ALPRAZOLAM 0.5 MG PO TABS
0.5000 mg | ORAL_TABLET | Freq: Three times a day (TID) | ORAL | Status: DC | PRN
  Administered 2014-04-26: 0.5 mg via ORAL
  Filled 2014-04-26: qty 1

## 2014-04-26 MED ORDER — MORPHINE SULFATE 2 MG/ML IJ SOLN: 2.0000 mg | INTRAMUSCULAR | Status: DC | PRN

## 2014-04-26 MED ORDER — ACETAMINOPHEN 325 MG PO TABS: 650.0000 mg | ORAL_TABLET | Freq: Four times a day (QID) | ORAL | Status: DC | PRN

## 2014-04-26 MED ORDER — GI COCKTAIL ~~LOC~~: 30.0000 mL | Freq: Four times a day (QID) | ORAL | Status: DC | PRN

## 2014-04-26 MED ORDER — PANTOPRAZOLE SODIUM 40 MG PO TBEC: 40.0000 mg | DELAYED_RELEASE_TABLET | Freq: Every day | ORAL | Status: DC

## 2014-04-26 MED ORDER — ATORVASTATIN CALCIUM 40 MG PO TABS
40.0000 mg | ORAL_TABLET | Freq: Every day | ORAL | Status: DC
  Filled 2014-04-26: qty 1

## 2014-04-26 MED ORDER — LISINOPRIL 10 MG PO TABS
10.0000 mg | ORAL_TABLET | Freq: Every day | ORAL | Status: DC
  Administered 2014-04-26: 10 mg via ORAL
  Filled 2014-04-26: qty 1

## 2014-04-26 NOTE — Discharge Summary (Signed)
Physician Discharge Summary  Alejandra Hernandez LFY:[id number] DOB: February 28, 1956 DOA: 04/25/2014  PCP: Antonietta Jewel, MD  Admit date: 04/25/2014 Discharge date: 04/26/2014  Recommendations for Outpatient Follow-up:  1. F/u with primary care doctor for further evaluation of microcytic anemia 2. ECHO, stress test, carotid duplex already scheduled by PCP 3. F/u with Dr. Radford Pax, cardiology, at already scheduled appointment to review test results on 6/24.  Discharge Diagnoses:  Principal Problem:   Chest pain Active Problems:   Gout   Hypertension   Diabetes   Microcytic anemia   Discharge Condition: stable, improved  Diet recommendation: healthy heart/diabetic  Wt Readings from Last 3 Encounters:  04/26/14 57.289 kg (126 lb 4.8 oz)  04/25/14 58.514 kg (129 lb)  03/25/14 58.968 kg (130 lb)    History of present illness:  Alejandra Hernandez is a 58 y.o. female with Past medical history of hypertension, diabetes, gout, dyslipidemia, anxiety.  Patient presented with complaints of chest pain. Her chest pain she mentions has been present for last 3 weeks. She mentions her pain is located substernally and appears to be a dull ache with some pressure-like sensation. The pain has always been present and has never improved. Worsening with lying down. This she has been seen in the ED with this pain and was referred to an cardiologist. Her d-dimer was elevated for which she was referred to ED for further workup.  Patient denies any recent travel or immobilization.  Patient denies any prior history of coronary artery disease or blood disorder.  Patient mentions she has significant history of family coronary artery disease in her mother at age 67, grandfather from the maternal side, maternal uncle.  The patient is coming from home. And at her baseline independent for most of her ADL.   Hospital Course:   Atypica chest pain.  Her d-dimer was elevated as an outpatient, however her CT Angio chest was negative  for pulmonary embolism. Her EKG demonstrated sinus bradycardia without ischemic changes. Her troponins were negative. Patient states that her symptoms of chest pressure or tightness are brought on by stress and anxiety, and she is going to remain relaxed until she can complete her further workup for ischemia. She request that she continue to followup with outpatient carotid duplex, echocardiogram, stress test, and her already scheduled appointment with Dr. Radford Pax, Cardiology.  Case discussed with Dr. Mare Ferrari who agrees with assessment and plan.  Will continue aspirin and statin and xanax.  Started PPI and gave Rx for NTG.    HTN, stable, continue norvasc and lisinopril.  Sinus bradycardia, not on AV nodal blocking medications.  Asymptomatic.  Diabetes mellitus, may resume metformin and glimeperide.  Microcytic anemia, may have some iron deficiency.  Defer to PCP for further evaluation.    Carotid bruit, scheduled for carotid duplex as outpatient.    Gout, stable, continue allopurinol.     Procedures:  CT angio chest  Consultations:  Dr. Mare Ferrari, cardiology, by phone  Discharge Exam: Filed Vitals:   04/26/14 0552  BP: 126/53  Pulse: 48  Temp: 97.9 F (36.6 C)  Resp: 18   Filed Vitals:   04/25/14 2300 04/25/14 2330 04/26/14 0020 04/26/14 0552  BP: 115/43 112/61 164/60 126/53  Pulse: 55 56 56 48  Temp:   97.5 F (36.4 C) 97.9 F (36.6 C)  TempSrc:   Oral Oral  Resp: 14 13 16 18   Height:   5\' 3"  (1.6 m)   Weight:   57.289 kg (126 lb 4.8 oz) 57.289 kg (126  lb 4.8 oz)  SpO2: 98% 98% 99% 100%    General: AAF, NAD HEENT:  NCAT, MMM Cardiovascular: RRR, no mrg, 2+ pulses Respiratory: CTAB ABD:  NABS, soft, ND/NT MSK:   No LEE  Discharge Instructions      Discharge Instructions   Call MD for:  difficulty breathing, headache or visual disturbances    Complete by:  As directed      Call MD for:  extreme fatigue    Complete by:  As directed      Call MD for:   hives    Complete by:  As directed      Call MD for:  persistant dizziness or light-headedness    Complete by:  As directed      Call MD for:  persistant nausea and vomiting    Complete by:  As directed      Call MD for:  severe uncontrolled pain    Complete by:  As directed      Call MD for:  temperature >100.4    Complete by:  As directed      Diet - low sodium heart healthy    Complete by:  As directed      Diet Carb Modified    Complete by:  As directed      Discharge instructions    Complete by:  As directed   You were hospitalized with chest pain.  Your CT of your lungs did not show any blood clots.  Your heart monitor showed a mildly slow heart rate, but no evidence of heart attack or heart rhythm problems.  Your blood tests for heart attack were also negative.  Please keep all of your appointments for your ultrasounds, stress test, and cardiology visit.  I have give you a prescription for nitroglycerin to see if that may help your chest pain and protonix, an acid reflux medication.  It takes three days for protonix to start to work and please take it regularly for a month to see if it decreases your pain.     Increase activity slowly    Complete by:  As directed             Medication List         allopurinol 100 MG tablet  Commonly known as:  ZYLOPRIM  Take 100 mg by mouth daily.     ALPRAZolam 0.5 MG tablet  Commonly known as:  XANAX  Take 0.5 mg by mouth 3 (three) times daily as needed.     amLODipine 10 MG tablet  Commonly known as:  NORVASC  Take 10 mg by mouth daily.     aspirin 81 MG chewable tablet  Chew 1 tablet (81 mg total) by mouth daily.     CRESTOR 20 MG tablet  Generic drug:  rosuvastatin  Take 20 mg by mouth every evening.     glimepiride 2 MG tablet  Commonly known as:  AMARYL  Take 2 mg by mouth every evening.     HYDROcodone-acetaminophen 5-500 MG per tablet  Commonly known as:  VICODIN  Take 1 tablet by mouth every 8 (eight) hours as  needed. For pain.     lisinopril 10 MG tablet  Commonly known as:  PRINIVIL,ZESTRIL  Take 1 tablet (10 mg total) by mouth daily.     metFORMIN 1000 MG tablet  Commonly known as:  GLUCOPHAGE  Take 1,000 mg by mouth 2 (two) times daily with a meal.     naproxen  500 MG tablet  Commonly known as:  NAPROSYN  Take 500 mg by mouth 2 (two) times daily after a meal.     nitroGLYCERIN 0.4 MG SL tablet  Commonly known as:  NITROSTAT  Place 1 tablet (0.4 mg total) under the tongue every 5 (five) minutes as needed for chest pain.     pantoprazole 40 MG tablet  Commonly known as:  PROTONIX  Take 1 tablet (40 mg total) by mouth daily before breakfast.       Follow-up Information   Schedule an appointment as soon as possible for a visit with Antonietta Jewel, MD. (As needed)    Specialty:  Internal Medicine   Contact information:   [address] [place] [address] [phone]       Follow up with Sueanne Margarita, MD On 05/17/2014. (already scheduled appointment)    Specialty:  Cardiology   Contact information:   [address] [place] [address] [phone]        The results of significant diagnostics from this hospitalization (including imaging, microbiology, ancillary and laboratory) are listed below for reference.    Significant Diagnostic Studies: Ct Angio Chest Pe W/cm &/or Wo Cm  04/25/2014   CLINICAL DATA:  Chest pain and shortness of Breath.  EXAM: CT ANGIOGRAPHY CHEST WITH CONTRAST  TECHNIQUE: Multidetector CT imaging of the chest was performed using the standard protocol during bolus administration of intravenous contrast. Multiplanar CT image reconstructions and MIPs were obtained to evaluate the vascular anatomy.  CONTRAST:  146mL OMNIPAQUE IOHEXOL 350 MG/ML SOLN  COMPARISON:  Chest radiograph, 03/25/2014.  FINDINGS: No evidence of a pulmonary embolus.  Heart is normal in size and configuration. Mild coronary artery calcifications. The great vessels are  normal in caliber. There is atherosclerotic plaque along the aortic arch and descending thoracic aorta. No significant stenosis.  No neck base or axillary masses or adenopathy. No mediastinal or hilar masses or adenopathy.  Lungs show mild changes of centrilobular emphysema. There is minor dependent subsegmental atelectasis. No lung consolidation or edema. No pleural effusion.  Limited evaluation of the upper abdomen is unremarkable.  No significant bony abnormality.  Review of the MIP images confirms the above findings.  IMPRESSION: 1. No evidence of a pulmonary embolus. 2. No acute findings.   Electronically Signed   By: Lajean Manes M.D.   On: 04/25/2014 22:30    Microbiology: No results found for this or any previous visit (from the past 240 hour(s)).   Labs: Basic Metabolic Panel:  Recent Labs Lab 04/25/14 2038  NA 137  K 4.1  CL 100  CO2 21  GLUCOSE 170*  BUN 20  CREATININE 0.83  CALCIUM 9.4   Liver Function Tests:  Recent Labs Lab 04/25/14 2038  AST 26  ALT 22  ALKPHOS 108  BILITOT <0.2*  PROT 7.9  ALBUMIN 3.4*   No results found for this basename: LIPASE, AMYLASE,  in the last 168 hours No results found for this basename: AMMONIA,  in the last 168 hours CBC:  Recent Labs Lab 04/25/14 2038  WBC 9.8  NEUTROABS 4.6  HGB 11.7*  HCT 36.3  MCV 71.3*  PLT 338   Cardiac Enzymes:  Recent Labs Lab 04/25/14 1509 04/25/14 2038 04/26/14 0112 04/26/14 0340  CKTOTAL 177  --   --   --   CKMB 3.4  --   --   --   TROPONINI <0.30 <0.30 <0.30 <0.30   BNP: BNP (last 3 results)  No results found for this basename: PROBNP,  in the last 8760 hours CBG: No results found for this basename: GLUCAP,  in the last 168 hours  Time coordinating discharge: 35 minutes  Signed:  Janece Canterbury  [facility] 04/26/2014, 8:01 AM

## 2014-04-26 NOTE — Discharge Instructions (Signed)
Chest Pain (Nonspecific) °It is often hard to give a specific diagnosis for the cause of chest pain. There is always a chance that your pain could be related to something serious, such as a heart attack or a blood clot in the lungs. You need to follow up with your caregiver for further evaluation. °CAUSES  °· Heartburn. °· Pneumonia or bronchitis. °· Anxiety or stress. °· Inflammation around your heart (pericarditis) or lung (pleuritis or pleurisy). °· A blood clot in the lung. °· A collapsed lung (pneumothorax). It can develop suddenly on its own (spontaneous pneumothorax) or from injury (trauma) to the chest. °· Shingles infection (herpes zoster virus). °The chest wall is composed of bones, muscles, and cartilage. Any of these can be the source of the pain. °· The bones can be bruised by injury. °· The muscles or cartilage can be strained by coughing or overwork. °· The cartilage can be affected by inflammation and become sore (costochondritis). °DIAGNOSIS  °Lab tests or other studies, such as X-rays, electrocardiography, stress testing, or cardiac imaging, may be needed to find the cause of your pain.  °TREATMENT  °· Treatment depends on what may be causing your chest pain. Treatment may include: °· Acid blockers for heartburn. °· Anti-inflammatory medicine. °· Pain medicine for inflammatory conditions. °· Antibiotics if an infection is present. °· You may be advised to change lifestyle habits. This includes stopping smoking and avoiding alcohol, caffeine, and chocolate. °· You may be advised to keep your head raised (elevated) when sleeping. This reduces the chance of acid going backward from your stomach into your esophagus. °· Most of the time, nonspecific chest pain will improve within 2 to 3 days with rest and mild pain medicine. °HOME CARE INSTRUCTIONS  °· If antibiotics were prescribed, take your antibiotics as directed. Finish them even if you start to feel better. °· For the next few days, avoid physical  activities that bring on chest pain. Continue physical activities as directed. °· Do not smoke. °· Avoid drinking alcohol. °· Only take over-the-counter or prescription medicine for pain, discomfort, or fever as directed by your caregiver. °· Follow your caregiver's suggestions for further testing if your chest pain does not go away. °· Keep any follow-up appointments you made. If you do not go to an appointment, you could develop lasting (chronic) problems with pain. If there is any problem keeping an appointment, you must call to reschedule. °SEEK MEDICAL CARE IF:  °· You think you are having problems from the medicine you are taking. Read your medicine instructions carefully. °· Your chest pain does not go away, even after treatment. °· You develop a rash with blisters on your chest. °SEEK IMMEDIATE MEDICAL CARE IF:  °· You have increased chest pain or pain that spreads to your arm, neck, jaw, back, or abdomen. °· You develop shortness of breath, an increasing cough, or you are coughing up blood. °· You have severe back or abdominal pain, feel nauseous, or vomit. °· You develop severe weakness, fainting, or chills. °· You have a fever. °THIS IS AN EMERGENCY. Do not wait to see if the pain will go away. Get medical help at once. Call your local emergency services (911 in [place] Do not drive yourself to the hospital. °MAKE SURE YOU:  °· Understand these instructions. °· Will watch your condition. °· Will get help right away if you are not doing well or get worse. °Document Released: 08/20/2005 Document Revised: 02/02/2012 Document Reviewed: 06/15/2008 °ExitCare® Patient Information ©2014 [employer]   [employer].   Diets for Diabetes, Food Labeling Look at food labels to help you decide how much of a product you can eat. You will want to check the amount of total carbohydrate in a serving to see how the food fits into your meal plan. In the list of ingredients, the ingredient present in the largest amount by weight must  be listed first, followed by the other ingredients in descending order. STANDARD OF IDENTITY Most products have a list of ingredients. However, foods that the Food and Drug Administration (FDA) has given a standard of identity do not need a list of ingredients. A standard of identity means that a food must contain certain ingredients if it is called a particular name. Examples are mayonnaise, peanut butter, ketchup, jelly, and cheese. LABELING TERMS There are many terms found on food labels. Some of these terms have specific definitions. Some terms are regulated by the FDA, and the FDA has clearly specified how they can be used. Others are not regulated or well-defined and can be misleading and confusing. SPECIFICALLY DEFINED TERMS Nutritive Sweetener.  A sweetener that contains calories,such as table sugar or honey. Nonnutritive Sweetener.  A sweetener with few or no calories,such as saccharin, aspartame, sucralose, and cyclamate. LABELING TERMS REGULATED BY THE FDA Free.  The product contains only a tiny or small amount of fat, cholesterol, sodium, sugar, or calories. For example, a "fat-free" product will contain less than 0.5 g of fat per serving. Low.  A food described as "low" in fat, saturated fat, cholesterol, sodium, or calories could be eaten fairly often without exceeding dietary guidelines. For example, "low in fat" means no more than 3 g of fat per serving. Lean.  "Lean" and "extra lean" are U.S. Department of Agriculture Scientist, research (physical sciences)) terms for use on meat and poultry products. "Lean" means the product contains less than 10 g of fat, 4 g of saturated fat, and 95 mg of cholesterol per serving. "Lean" is not as low in fat as a product labeled "low." Extra Lean.  "Extra lean" means the product contains less than 5 g of fat, 2 g of saturated fat, and 95 mg of cholesterol per serving. While "extra lean" has less fat than "lean," it is still higher in fat than a product labeled  "low." Reduced, Less, Fewer.  A diet product that contains 25% less of a nutrient or calories than the regular version. For example, hot dogs might be labeled "25% less fat than our regular hot dogs." Light/Lite.  A diet product that contains  fewer calories or  the fat of the original. For example, "light in sodium" means a product with  the usual sodium. More.  One serving contains at least 10% more of the daily value of a vitamin, mineral, or fiber than usual. Good Source Of.  One serving contains 10% to 19% of the daily value for a particular vitamin, mineral, or fiber. Excellent Source Of.  One serving contains 20% or more of the daily value for a particular nutrient. Other terms used might be "high in" or "rich in." Enriched or Fortified.  The product contains added vitamins, minerals, or protein. Nutrition labeling must be used on enriched or fortified foods. Imitation.  The product has been altered so that it is lower in protein, vitamins, or minerals than the usual food,such as imitation peanut butter. Total Fat.  The number listed is the total of all fat found in a serving of the product. Under total fat, food labels must  list saturated fat and trans fat, which are associated with raising bad cholesterol and an increased risk of heart blood vessel disease. Saturated Fat.  Mainly fats from animal-based sources. Some examples are red meat, cheese, cream, whole milk, and coconut oil. Trans Fat.  Found in some fried snack foods, packaged foods, and fried restaurant foods. It is recommended you eat as close to 0 g of trans fat as possible, since it raises bad cholesterol and lowers good cholesterol. Polyunsaturated and Monounsaturated Fats.  More healthful fats. These fats are from plant sources. Total Carbohydrate.  The number of carbohydrate grams in a serving of the product. Under total carbohydrate are listed the other carbohydrate sources, such as dietary fiber and  sugars. Dietary Fiber.  A carbohydrate from plant sources. Sugars.  Sugars listed on the label contain all naturally occurring sugars as well as added sugars. LABELING TERMS NOT REGULATED BY THE FDA Sugarless.  Table sugar (sucrose) has not been added. However, the manufacturer may use another form of sugar in place of sucrose to sweeten the product. For example, sugar alcohols are used to sweeten foods. Sugar alcohols are a form of sugar but are not table sugar. If a product contains sugar alcohols in place of sucrose, it can still be labeled "sugarless." Low Salt, Salt-Free, Unsalted, No Salt, No Salt Added, Without Added Salt.  Food that is usually processed with salt has been made without salt. However, the food may contain sodium-containing additives, such as preservatives, leavening agents, or flavorings. Natural.  This term has no legal meaning. Organic.  Foods that are certified as organic have been inspected and approved by the USDA to ensure they are produced without pesticides, fertilizers containing synthetic ingredients, bioengineering, or ionizing radiation. Document Released: 11/13/2003 Document Revised: 02/02/2012 Document Reviewed: 05/31/2009 [facility] Patient Information 2014 [place], [place].  Diabetes Meal Planning Guide The diabetes meal planning guide is a tool to help you plan your meals and snacks. It is important for people with diabetes to manage their blood glucose (sugar) levels. Choosing the right foods and the right amounts throughout your day will help control your blood glucose. Eating right can even help you improve your blood pressure and reach or maintain a healthy weight. CARBOHYDRATE COUNTING MADE EASY When you eat carbohydrates, they turn to sugar. This raises your blood glucose level. Counting carbohydrates can help you control this level so you feel better. When you plan your meals by counting carbohydrates, you can have more flexibility in what you eat  and balance your medicine with your food intake. Carbohydrate counting simply means adding up the total amount of carbohydrate grams in your meals and snacks. Try to eat about the same amount at each meal. Foods with carbohydrates are listed below. Each portion below is 1 carbohydrate serving or 15 grams of carbohydrates. Ask your dietician how many grams of carbohydrates you should eat at each meal or snack. Grains and Starches  1 slice bread.   English muffin or hotdog/hamburger bun.   cup cold cereal (unsweetened).   cup cooked pasta or rice.   cup starchy vegetables (corn, potatoes, peas, beans, winter squash).  1 tortilla (6 inches).   bagel.  1 waffle or pancake (size of a CD).   cup cooked cereal.  4 to 6 small crackers. *Whole grain is recommended. Fruit  1 cup fresh unsweetened berries, melon, papaya, pineapple.  1 small fresh fruit.   banana or mango.   cup fruit juice (4 oz unsweetened).   cup  canned fruit in natural juice or water.  2 tbs dried fruit.  12 to 15 grapes or cherries. Milk and Yogurt  1 cup fat-free or 1% milk.  1 cup soy milk.  6 oz light yogurt with sugar-free sweetener.  6 oz low-fat soy yogurt.  6 oz plain yogurt. Vegetables  1 cup raw or  cup cooked is counted as 0 carbohydrates or a "free" food.  If you eat 3 or more servings at 1 meal, count them as 1 carbohydrate serving. Other Carbohydrates   oz chips or pretzels.   cup ice cream or frozen yogurt.   cup sherbet or sorbet.  2 inch square cake, no frosting.  1 tbs honey, sugar, jam, jelly, or syrup.  2 small cookies.  3 squares of graham crackers.  3 cups popcorn.  6 crackers.  1 cup broth-based soup.  Count 1 cup casserole or other mixed foods as 2 carbohydrate servings.  Foods with less than 20 calories in a serving may be counted as 0 carbohydrates or a "free" food. You may want to purchase a book or computer software that lists the  carbohydrate gram counts of different foods. In addition, the nutrition facts panel on the labels of the foods you eat are a good source of this information. The label will tell you how big the serving size is and the total number of carbohydrate grams you will be eating per serving. Divide this number by 15 to obtain the number of carbohydrate servings in a portion. Remember, 1 carbohydrate serving equals 15 grams of carbohydrate. SERVING SIZES Measuring foods and serving sizes helps you make sure you are getting the right amount of food. The list below tells how big or small some common serving sizes are.  1 oz.........4 stacked dice.  3 oz........Marland KitchenDeck of cards.  1 tsp.......Marland KitchenTip of little finger.  1 tbs......Marland KitchenMarland KitchenThumb.  2 tbs.......Marland KitchenGolf ball.   cup......Marland KitchenHalf of a fist.  1 cup.......Marland KitchenA fist. SAMPLE DIABETES MEAL PLAN Below is a sample meal plan that includes foods from the grain and starches, dairy, vegetable, fruit, and meat groups. A dietician can individualize a meal plan to fit your calorie needs and tell you the number of servings needed from each food group. However, controlling the total amount of carbohydrates in your meal or snack is more important than making sure you include all of the food groups at every meal. You may interchange carbohydrate containing foods (dairy, starches, and fruits). The meal plan below is an example of a 2000 calorie diet using carbohydrate counting. This meal plan has 17 carbohydrate servings. Breakfast  1 cup oatmeal (2 carb servings).   cup light yogurt (1 carb serving).  1 cup blueberries (1 carb serving).   cup almonds. Snack  1 large apple (2 carb servings).  1 low-fat string cheese stick. Lunch  Chicken breast salad.  1 cup spinach.   cup chopped tomatoes.  2 oz chicken breast, sliced.  2 tbs low-fat [place] dressing.  12 whole-wheat crackers (2 carb servings).  12 to 15 grapes (1 carb serving).  1 cup low-fat milk  (1 carb serving). Snack  1 cup carrots.   cup hummus (1 carb serving). Dinner  3 oz broiled salmon.  1 cup brown rice (3 carb servings). Snack  1  cups steamed broccoli (1 carb serving) drizzled with 1 tsp olive oil and lemon juice.  1 cup light pudding (2 carb servings). DIABETES MEAL PLANNING WORKSHEET Your dietician can use this worksheet to help you  decide how many servings of foods and what types of foods are right for you.  BREAKFAST Food Group and Servings / Carb Servings Grain/Starches __________________________________ Dairy __________________________________________ Vegetable ______________________________________ Fruit ___________________________________________ Meat __________________________________________ Fat ____________________________________________ LUNCH Food Group and Servings / Carb Servings Grain/Starches ___________________________________ Dairy ___________________________________________ Fruit ____________________________________________ Meat ___________________________________________ Fat _____________________________________________ W[employer] Food Group and Servings / Carb Servings Grain/Starches ___________________________________ Dairy ___________________________________________ Fruit ____________________________________________ Meat ___________________________________________ Fat _____________________________________________ SNACKS Food Group and Servings / Carb Servings Grain/Starches ___________________________________ Dairy ___________________________________________ Vegetable _______________________________________ Fruit ____________________________________________ Meat ___________________________________________ Fat _____________________________________________ DAILY TOTALS Starches _________________________ Vegetable ________________________ Fruit ____________________________ Dairy ____________________________ Meat  ____________________________ Fat ______________________________ Document Released: 08/07/2005 Document Revised: 02/02/2012 Document Reviewed: 06/18/2009 [employer] Patient Information 2014 [employer].

## 2014-04-26 NOTE — H&P (Signed)
[facility] History and Physical  Patient: Alejandra Hernandez  UDJ:[id number]  DOB: 12-08-1955  DOS: the patient was seen and examined on 04/25/2014 PCP: Antonietta Jewel, MD  Chief Complaint: Chest pain  HPI: [name] is a 58 y.o. female with Past medical history of hypertension, diabetes, gout, dyslipidemia, anxiety. Patient presented with complaints of chest pain. Her chest pain she mentions has been present for last 3 weeks. She mentions her pain is located substernally and appears to be a dull ache with some pressure-like sensation. The pain has always been present and has never improved. Worsening with lying down. This she has been seen in the ED with this pain and was referred to an cardiologist. Her d-dimer was elevated for which she was referred to ED for further workup. Patient denies any recent travel or immobilization. Patient denies any prior history of coronary artery disease or blood disorder. Patient mentions she has significant history of family coronary artery disease in her mother at age 83, grandfather from the maternal side, maternal uncle.  The patient is coming from home. And at her baseline independent for most of her ADL.  Review of Systems: as mentioned in the history of present illness.  A Comprehensive review of the other systems is negative.  Past Medical History  Diagnosis Date  . Diabetes mellitus   . Gout   . Chronic back pain   . Anxiety   . Hypertension   . Hyperlipidemia    Past Surgical History  Procedure Laterality Date  . Lymph gland excision    . Cervical fusion    . Cesarean section    . Shoulder surgery      left side   Social History:  reports that she has been smoking.  She quit smokeless tobacco use about 47 years ago. Her smokeless tobacco use included Snuff and Chew. She reports that she drinks about 1.8 ounces of alcohol per week. She reports that she does not use illicit drugs.  No Known Allergies  Family History  Problem  Relation Age of Onset  . Diabetes Mother   . Heart disease Mother   . Kidney failure Mother   . Diabetes Sister     Prior to Admission medications   Medication Sig Start Date End Date Taking? Authorizing Provider  allopurinol (ZYLOPRIM) 100 MG tablet Take 100 mg by mouth daily.     Yes Historical Provider, MD  ALPRAZolam Duanne Moron) 0.5 MG tablet Take 0.5 mg by mouth 3 (three) times daily as needed.    Yes Historical Provider, MD  amLODipine (NORVASC) 10 MG tablet Take 10 mg by mouth daily.  03/08/14  Yes Historical Provider, MD  aspirin 81 MG chewable tablet Chew 1 tablet (81 mg total) by mouth daily. 03/25/14  Yes Tatyana A Kirichenko, PA-C  CRESTOR 20 MG tablet Take 20 mg by mouth every evening.  03/08/14  Yes Historical Provider, MD  glimepiride (AMARYL) 2 MG tablet Take 2 mg by mouth every evening.  03/08/14  Yes Historical Provider, MD  HYDROcodone-acetaminophen (VICODIN) 5-500 MG per tablet Take 1 tablet by mouth every 8 (eight) hours as needed. For pain.   Yes Historical Provider, MD  lisinopril (PRINIVIL,ZESTRIL) 10 MG tablet Take 1 tablet (10 mg total) by mouth daily. 04/25/14  Yes Sueanne Margarita, MD  metFORMIN (GLUCOPHAGE) 1000 MG tablet Take 1,000 mg by mouth 2 (two) times daily with a meal.   Yes Historical Provider, MD  naproxen (NAPROSYN) 500 MG tablet Take 500 mg by  mouth 2 (two) times daily after a meal.  02/09/14  Yes Historical Provider, MD    Physical Exam: Filed Vitals:   04/25/14 2300 04/25/14 2330 04/26/14 0020 04/26/14 0552  BP: 115/43 112/61 164/60 126/53  Pulse: 55 56 56 48  Temp:   97.5 F (36.4 C) 97.9 F (36.6 C)  TempSrc:   Oral Oral  Resp: 14 13 16 18   Height:   5\' 3"  (1.6 m)   Weight:   57.289 kg (126 lb 4.8 oz) 57.289 kg (126 lb 4.8 oz)  SpO2: 98% 98% 99% 100%    General: Alert, Awake and Oriented to Time, Place and Person. Appear in mild distress Eyes: PERRL ENT: Oral Mucosa clear moist. Neck: No JVD Cardiovascular: S1 and S2 Present, no Murmur,  Peripheral Pulses Present Respiratory: Bilateral Air entry equal and Decreased, Clear to Auscultation,  No Crackles, no wheezes Abdomen: Bowel Sound Present, Soft and Non tender Skin: No Rash Extremities: No Pedal edema, no calf tenderness Neurologic: Grossly no focal neuro deficit. Labs on Admission:  CBC:  Recent Labs Lab 04/25/14 2038  WBC 9.8  NEUTROABS 4.6  HGB 11.7*  HCT 36.3  MCV 71.3*  PLT 338    CMP     Component Value Date/Time   NA 137 04/25/2014 2038   K 4.1 04/25/2014 2038   CL 100 04/25/2014 2038   CO2 21 04/25/2014 2038   GLUCOSE 170* 04/25/2014 2038   BUN 20 04/25/2014 2038   CREATININE 0.83 04/25/2014 2038   CALCIUM 9.4 04/25/2014 2038   PROT 7.9 04/25/2014 2038   ALBUMIN 3.4* 04/25/2014 2038   AST 26 04/25/2014 2038   ALT 22 04/25/2014 2038   ALKPHOS 108 04/25/2014 2038   BILITOT <0.2* 04/25/2014 2038   GFRNONAA 77* 04/25/2014 2038   GFRAA 89* 04/25/2014 2038    No results found for this basename: LIPASE, AMYLASE,  in the last 168 hours No results found for this basename: AMMONIA,  in the last 168 hours   Recent Labs Lab 04/25/14 1509 04/25/14 2038 04/26/14 0112 04/26/14 0340  CKTOTAL 177  --   --   --   CKMB 3.4  --   --   --   TROPONINI <0.30 <0.30 <0.30 <0.30   BNP (last 3 results) No results found for this basename: PROBNP,  in the last 8760 hours  Radiological Exams on Admission: Ct Angio Chest Pe W/cm &/or Wo Cm  04/25/2014   CLINICAL DATA:  Chest pain and shortness of Breath.  EXAM: CT ANGIOGRAPHY CHEST WITH CONTRAST  TECHNIQUE: Multidetector CT imaging of the chest was performed using the standard protocol during bolus administration of intravenous contrast. Multiplanar CT image reconstructions and MIPs were obtained to evaluate the vascular anatomy.  CONTRAST:  13mL OMNIPAQUE IOHEXOL 350 MG/ML SOLN  COMPARISON:  Chest radiograph, 03/25/2014.  FINDINGS: No evidence of a pulmonary embolus.  Heart is normal in size and configuration. Mild coronary artery  calcifications. The great vessels are normal in caliber. There is atherosclerotic plaque along the aortic arch and descending thoracic aorta. No significant stenosis.  No neck base or axillary masses or adenopathy. No mediastinal or hilar masses or adenopathy.  Lungs show mild changes of centrilobular emphysema. There is minor dependent subsegmental atelectasis. No lung consolidation or edema. No pleural effusion.  Limited evaluation of the upper abdomen is unremarkable.  No significant bony abnormality.  Review of the MIP images confirms the above findings.  IMPRESSION: 1. No evidence of a pulmonary embolus. 2.  No acute findings.   Electronically Signed   By: Lajean Manes M.D.   On: 04/25/2014 22:30    EKG: Independently reviewed. normal EKG, normal sinus rhythm.  Assessment/Plan Principal Problem:   Chest pain Active Problems:   Hypertension   Diabetes   1. Chest pain The patient is or chest pain. Her EKG and serial troponins are negative at present. She will be admitted to the hospital for further workup. Cardiology has recommended to echocardiogram and Myoview as an outpatient. Patient will be monitored in the hospital and will be discussed with cardiology. Continue aspirin, Lipitor. Place her on sliding scale. Hold metformin. Continue Xanax Add PPI.  2. Diabetes mellitus Hold metformin in the midright and continue on sliding scale.   DVT Prophylaxis: subcutaneous Heparin Nutrition: N.p.o. after midnight  Code Status: Full  Disposition: Admitted to observation in telemetry unit.  Author: Berle Mull, MD [facility] Pager: [phone]  If 7PM-7AM, please contact night-coverage [email or web address] Password [email or web address]

## 2014-04-27 ENCOUNTER — Ambulatory Visit (HOSPITAL_COMMUNITY): Payer: Medicare Other | Attending: Cardiovascular Disease | Admitting: Cardiology

## 2014-04-27 DIAGNOSIS — R0602 Shortness of breath: Secondary | ICD-10-CM

## 2014-04-27 DIAGNOSIS — I6529 Occlusion and stenosis of unspecified carotid artery: Secondary | ICD-10-CM | POA: Insufficient documentation

## 2014-04-27 DIAGNOSIS — E785 Hyperlipidemia, unspecified: Secondary | ICD-10-CM | POA: Insufficient documentation

## 2014-04-27 DIAGNOSIS — I10 Essential (primary) hypertension: Secondary | ICD-10-CM

## 2014-04-27 DIAGNOSIS — R079 Chest pain, unspecified: Secondary | ICD-10-CM | POA: Insufficient documentation

## 2014-04-27 DIAGNOSIS — R0989 Other specified symptoms and signs involving the circulatory and respiratory systems: Secondary | ICD-10-CM

## 2014-04-27 NOTE — Progress Notes (Signed)
Carotid duplex performed 

## 2014-04-28 ENCOUNTER — Telehealth: Payer: Self-pay | Admitting: Cardiology

## 2014-04-28 NOTE — Telephone Encounter (Signed)
Notified of carotid doppler results. 

## 2014-04-28 NOTE — Telephone Encounter (Signed)
New message    Calling for test results  

## 2014-05-02 ENCOUNTER — Other Ambulatory Visit (INDEPENDENT_AMBULATORY_CARE_PROVIDER_SITE_OTHER): Payer: Medicare Other

## 2014-05-02 ENCOUNTER — Ambulatory Visit (INDEPENDENT_AMBULATORY_CARE_PROVIDER_SITE_OTHER): Payer: Medicare Other | Admitting: *Deleted

## 2014-05-02 VITALS — BP 155/70 | HR 64 | Ht 63.0 in | Wt 128.5 lb

## 2014-05-02 DIAGNOSIS — R079 Chest pain, unspecified: Secondary | ICD-10-CM

## 2014-05-02 DIAGNOSIS — E785 Hyperlipidemia, unspecified: Secondary | ICD-10-CM

## 2014-05-02 DIAGNOSIS — R0602 Shortness of breath: Secondary | ICD-10-CM

## 2014-05-02 DIAGNOSIS — R0989 Other specified symptoms and signs involving the circulatory and respiratory systems: Secondary | ICD-10-CM

## 2014-05-02 DIAGNOSIS — I10 Essential (primary) hypertension: Secondary | ICD-10-CM

## 2014-05-02 LAB — BASIC METABOLIC PANEL
BUN: 20 mg/dL (ref 6–23)
CALCIUM: 9.5 mg/dL (ref 8.4–10.5)
CO2: 23 mEq/L (ref 19–32)
Chloride: 102 mEq/L (ref 96–112)
Creatinine, Ser: 1 mg/dL (ref 0.4–1.2)
GFR: 75.08 mL/min (ref >60.00)
GLUCOSE: 87 mg/dL (ref 70–99)
POTASSIUM: 4.7 meq/L (ref 3.5–5.1)
SODIUM: 134 meq/L — AB (ref 135–145)

## 2014-05-02 NOTE — Progress Notes (Addendum)
1.) Reason for visit: BP check right arm 155/70 left arm 155/54  2.) Name of MD requesting visit: Traci Turner  3.) H&P: Hypertension  4.) ROS related to problem: on 04/25/14 O/V Lisinopril was increased to 10 mg daily. Pt took Norvasc 10 mg the morning, she takes the Lisinopril in the evenings  5.) Assessment and plan per MD:  Send note to MD for recommendations.  Will have patient take Lisinopril each morning with her Norvasc. Will have patient check her BP daily for a week and bring readings to nurse visit in 1 week  Signed: Fransico Him, MD 05/02/2014   Pt is aware of MD's recommendations to take the Lisinopril and Norvasc in the AM.Pt to check BP every day and bring BP reading to nurse appointment on Tuesday 05/09/14.

## 2014-05-02 NOTE — Patient Instructions (Signed)
Pt is aware that she will be call when MD review BP reading and make recommendations.

## 2014-05-09 ENCOUNTER — Ambulatory Visit: Payer: Medicare Other | Admitting: *Deleted

## 2014-05-09 VITALS — BP 150/50 | HR 60 | Wt 128.8 lb

## 2014-05-09 DIAGNOSIS — I10 Essential (primary) hypertension: Secondary | ICD-10-CM

## 2014-05-09 NOTE — Patient Instructions (Signed)
Will send to dr turner for review and then call with recommendations.

## 2014-05-09 NOTE — Progress Notes (Signed)
Left detailed message for pt on her private voicemail of dr turner's recommendations.

## 2014-05-09 NOTE — Progress Notes (Signed)
1.) Reason for visit: blood pressure check  2.) Name of MD requesting visit: dr turner  3.) H&P: hypertension  4.) ROS related to problem: pt took her lisinopril this am, she reports she does not take the amlodipine until lunch time.             bp readings from home range from 109-133/57-90. No complaints today.  5.) Assessment and plan per MD:   Above BP and note reviewed. Please instruct  Patient to take lisinopril and amlodipine first thing in the am daily  Traci Turner,MD

## 2014-05-11 ENCOUNTER — Ambulatory Visit (HOSPITAL_BASED_OUTPATIENT_CLINIC_OR_DEPARTMENT_OTHER): Payer: Medicare Other | Admitting: Radiology

## 2014-05-11 ENCOUNTER — Ambulatory Visit (HOSPITAL_COMMUNITY): Payer: Medicare Other | Attending: Cardiology | Admitting: Radiology

## 2014-05-11 VITALS — BP 118/53 | HR 54 | Ht 63.0 in | Wt 125.0 lb

## 2014-05-11 DIAGNOSIS — R079 Chest pain, unspecified: Secondary | ICD-10-CM

## 2014-05-11 DIAGNOSIS — R110 Nausea: Secondary | ICD-10-CM

## 2014-05-11 DIAGNOSIS — R0602 Shortness of breath: Secondary | ICD-10-CM | POA: Diagnosis not present

## 2014-05-11 DIAGNOSIS — M549 Dorsalgia, unspecified: Secondary | ICD-10-CM

## 2014-05-11 DIAGNOSIS — I10 Essential (primary) hypertension: Secondary | ICD-10-CM

## 2014-05-11 DIAGNOSIS — R42 Dizziness and giddiness: Secondary | ICD-10-CM | POA: Diagnosis not present

## 2014-05-11 DIAGNOSIS — R072 Precordial pain: Secondary | ICD-10-CM

## 2014-05-11 DIAGNOSIS — R0989 Other specified symptoms and signs involving the circulatory and respiratory systems: Secondary | ICD-10-CM

## 2014-05-11 DIAGNOSIS — E785 Hyperlipidemia, unspecified: Secondary | ICD-10-CM

## 2014-05-11 DIAGNOSIS — R0609 Other forms of dyspnea: Secondary | ICD-10-CM | POA: Diagnosis not present

## 2014-05-11 DIAGNOSIS — G8929 Other chronic pain: Secondary | ICD-10-CM

## 2014-05-11 MED ORDER — TECHNETIUM TC 99M SESTAMIBI GENERIC - CARDIOLITE
10.0000 | Freq: Once | INTRAVENOUS | Status: AC | PRN
  Administered 2014-05-11: 10 via INTRAVENOUS

## 2014-05-11 MED ORDER — TECHNETIUM TC 99M SESTAMIBI GENERIC - CARDIOLITE
30.0000 | Freq: Once | INTRAVENOUS | Status: AC | PRN
  Administered 2014-05-11: 30 via INTRAVENOUS

## 2014-05-11 MED ORDER — AMINOPHYLLINE 25 MG/ML IV SOLN
75.0000 mg | Freq: Once | INTRAVENOUS | Status: AC
  Administered 2014-05-11: 75 mg via INTRAVENOUS

## 2014-05-11 MED ORDER — REGADENOSON 0.4 MG/5ML IV SOLN
0.4000 mg | Freq: Once | INTRAVENOUS | Status: AC
  Administered 2014-05-11: 0.4 mg via INTRAVENOUS

## 2014-05-11 NOTE — Progress Notes (Signed)
[facility] [address] [place], [place] [address] [phone]    Cardiology Nuclear Hernandez Study  Alejandra Hernandez is a 58 y.o. female     MRN : [id number]     DOB: 1955-12-20  Procedure Date: 05/11/2014  Nuclear Hernandez Background Indication for Stress Test:  Evaluation for Ischemia History:  No known CAD, Echo 6/15 (pending), hx. seizures ~15 yrs ago, Carotid Disease Cardiac Risk Factors: Carotid Disease, Family History - CAD, Hypertension, Lipids, NIDDM and Smoker  Symptoms:  Chest Pain (last date of chest discomfort was yesterday), Dizziness, DOE and SOB   Nuclear Pre-Procedure Caffeine/Decaff Intake:  None> 12 hrs NPO After: 11:00pm   Lungs:  clear O2 Sat: 99% on room air. IV 0.9% NS with Angio Cath:  22g  IV Site: R Wrist x 1, tolerated well IV Started by:  Alejandra Baltimore, RN  Chest Size (in):  34 Cup Size: B  Height: 5\' 3"  (1.6 m)  Weight:  125 lb (56.7 kg)  BMI:  Body mass index is 22.15 kg/(m^2). Tech Comments:  Patient took Lisinopril, Amlodipine, and Metformin this am. Fasting CBG was 95 on arrival at 0950 with sprite given.    Nuclear Hernandez Study 1 or 2 day study: 1 day  Stress Test Type:  Lexiscan  Reading MD: N/A  Order Authorizing Provider:  Fransico Him, MD  Resting Radionuclide: Technetium 35m Sestamibi  Resting Radionuclide Dose: 11.0 mCi   Stress Radionuclide:  Technetium 36m Sestamibi  Stress Radionuclide Dose: 33.0 mCi           Stress Protocol Rest HR: 54 Stress HR: 113  Rest BP: 118/53 Stress BP: 124/53  Exercise Time (min): n/a METS: n/a           Dose of Adenosine (mg):  n/a Dose of Lexiscan: 0.4 mg  Dose of Atropine (mg): n/a Dose of Dobutamine: n/a mcg/kg/min (at max HR)  Stress Test Technologist: Ale[place]andra Hernandez, BS-ES  Nuclear Technologist:  Alejandra Hernandez, CNMT     Rest Procedure:  Myocardial perfusion imaging was performed at rest 45 minutes following the intravenous administration of Technetium 68m  Sestamibi. Rest ECG: Normal sinus rhythm. Normal EKG  Stress Procedure:  The patient received IV Lexiscan 0.4 mg over 15-seconds.  Technetium 65m Sestamibi injected at 30-seconds.  Quantitative spect images were obtained after a 45 minute delay.  During the infusion of Lexiscan the patient complained of SOB and severe nausea.  Administered 75 mg aminophylline and the patient began to feel better within a couple of minutes.  Stress ECG: No significant change from baseline ECG  QPS Raw Data Images:  Normal; no motion artifact; normal heart/lung ratio. Stress Images:  Normal homogeneous uptake in all areas of the myocardium. Rest Images:  Normal homogeneous uptake in all areas of the myocardium. Subtraction (SDS):  No evidence of ischemia. Transient Ischemic Dilatation (Normal <1.22):  1.09 Lung/Heart Ratio (Normal <0.45):  0.32  Quantitative Gated Spect Images QGS EDV:  67 ml QGS ESV:  19 ml  Impression Exercise Capacity:  Lexiscan with no exercise. BP Response:  Normal blood pressure response. Clinical Symptoms:  Shortness of breath ECG Impression:  No significant ST segment change suggestive of ischemia. Comparison with Prior Nuclear Study: No images to compare  Overall Impression:  Normal stress nuclear study. This is a low risk scan. There is no scar or ischemia.  LV Ejection Fraction: 72%.  LV Wall Motion:  Normal Wall Motion.  Alejandra Argyle, MD

## 2014-05-11 NOTE — Progress Notes (Signed)
Echocardiogram performed.  

## 2014-05-17 ENCOUNTER — Encounter: Payer: Self-pay | Admitting: Cardiology

## 2014-05-17 ENCOUNTER — Ambulatory Visit (INDEPENDENT_AMBULATORY_CARE_PROVIDER_SITE_OTHER): Payer: Medicare Other | Admitting: Cardiology

## 2014-05-17 VITALS — BP 132/68 | HR 60 | Ht 63.0 in | Wt 129.8 lb

## 2014-05-17 DIAGNOSIS — R079 Chest pain, unspecified: Secondary | ICD-10-CM

## 2014-05-17 DIAGNOSIS — I6529 Occlusion and stenosis of unspecified carotid artery: Secondary | ICD-10-CM

## 2014-05-17 DIAGNOSIS — I10 Essential (primary) hypertension: Secondary | ICD-10-CM

## 2014-05-17 DIAGNOSIS — I739 Peripheral vascular disease, unspecified: Secondary | ICD-10-CM

## 2014-05-17 DIAGNOSIS — I779 Disorder of arteries and arterioles, unspecified: Secondary | ICD-10-CM | POA: Insufficient documentation

## 2014-05-17 DIAGNOSIS — K219 Gastro-esophageal reflux disease without esophagitis: Secondary | ICD-10-CM | POA: Insufficient documentation

## 2014-05-17 DIAGNOSIS — E785 Hyperlipidemia, unspecified: Secondary | ICD-10-CM

## 2014-05-17 MED ORDER — PANTOPRAZOLE SODIUM 40 MG PO TBEC: 40.0000 mg | DELAYED_RELEASE_TABLET | Freq: Every day | ORAL | Status: DC

## 2014-05-17 NOTE — Patient Instructions (Signed)
Your physician recommends that you continue on your current medications as directed. Please refer to the Current Medication list given to you today.  Your physician wants you to follow-up in: 6 months with Dr Turner You will receive a reminder letter in the mail two months in advance. If you don't receive a letter, please call our office to schedule the follow-up appointment.  
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
## 2014-06-03 ENCOUNTER — Encounter (HOSPITAL_COMMUNITY): Payer: Self-pay | Admitting: Emergency Medicine

## 2014-06-03 ENCOUNTER — Emergency Department (INDEPENDENT_AMBULATORY_CARE_PROVIDER_SITE_OTHER)
Admission: EM | Admit: 2014-06-03 | Discharge: 2014-06-03 | Disposition: A | Discharge status: Home / Self Care | Payer: Medicare Other | Source: Home / Self Care | Attending: Emergency Medicine | Admitting: Emergency Medicine

## 2014-06-03 DIAGNOSIS — M109 Gout, unspecified: Secondary | ICD-10-CM

## 2014-06-03 LAB — URIC ACID: Uric Acid, Serum: 4 mg/dL (ref 2.4–7.0)

## 2014-06-03 MED ORDER — METHYLPREDNISOLONE ACETATE 80 MG/ML IJ SUSP
80.0000 mg | Freq: Once | INTRAMUSCULAR | Status: AC
Start: 2014-06-03 — End: 2014-06-03
  Administered 2014-06-03: 80 mg via INTRAMUSCULAR

## 2014-06-03 MED ORDER — KETOROLAC TROMETHAMINE 60 MG/2ML IM SOLN
INTRAMUSCULAR | Status: AC
  Filled 2014-06-03: qty 2

## 2014-06-03 MED ORDER — COLCHICINE 0.6 MG PO TABS: ORAL_TABLET | ORAL | Status: DC

## 2014-06-03 MED ORDER — METHYLPREDNISOLONE ACETATE 80 MG/ML IJ SUSP
INTRAMUSCULAR | Status: AC
  Filled 2014-06-03: qty 1

## 2014-06-03 MED ORDER — OXYCODONE-ACETAMINOPHEN 5-325 MG PO TABS: ORAL_TABLET | ORAL | Status: DC

## 2014-06-03 MED ORDER — KETOROLAC TROMETHAMINE 60 MG/2ML IM SOLN
60.0000 mg | Freq: Once | INTRAMUSCULAR | Status: AC
Start: 2014-06-03 — End: 2014-06-03
  Administered 2014-06-03: 60 mg via INTRAMUSCULAR

## 2014-06-03 NOTE — Discharge Instructions (Signed)
Dietary treatment plays a supplementary role in treatment of gout.  Diet can be expected to decrease the uric acid level by 10 to 15%.  Often medication can effect a much more substantial reduction.  An extremely restrictive diet is not necessary, but here are a few suggestions that might help decrease the frequency of gout attacks.  The first and most important measure is to achieve and maintain a healthy body weight (BMI of 20 to 25).  It's been shown that people with a BMI over 25 have an increased risk of gout attacks compared to people with a BMI of less than 25.  Also, gout patients who lose as little as 4.5 kg or 9.9 lbs will decrease their risk of gout attacks.  Beyond that, here are a few general guidelines:  Eat less: Red meat Seafood Beer and hard alcohol (e.g. gin, vodka, whiskey) Foods that contain high fructose corn syrup (found in sweets and non-diet sodas) Organ meats (liver, kidneys, brains, sweetbreads) or foods made from these meats (hot dogs, bologna).  Eat more: Low fat dairy products A moderate amount of wine (up to two 5 oz servings per day) is not likely to increase the risk of a gout attack. Coffee may decrease the risk of gout attacks Vitamin C (500 mg per day) has a mild urate lowering effect

## 2014-06-03 NOTE — ED Notes (Signed)
Patient complains of some swelling to the  Bottom of her right foot under her great toe for  The past week . Denies any injury

## 2014-06-03 NOTE — ED Provider Notes (Signed)
  Chief Complaint   Chief Complaint  Patient presents with  . Foot Swelling    History of Present Illness   Alejandra Hernandez is a 58 year old female with a history of gout who has had a six-day history of pain in the right foot overlying the right great toe MTP joint. There's been no trauma to this area. Is been red and swollen. She denies any fever or chills. No other joint pain. Hurts with pressure it hurts to move it.  Review of Systems   Other than as noted above, the patient denies any of the following symptoms: Systemic:  No fevers or chills. Musculoskeletal:  No joint pain or arthritis.  Neurological:  No muscular weakness, paresthesias.   [place]   Past medical history, family history, social history, meds, and allergies were reviewed.   She has diabetes, hypertension, gout, and GERD. Current meds include allopurinol, Xanax, Norvasc, aspirin, Crestor, Amaryl, Vicodin, lisinopril, metformin, naproxen, Nitrostat, and Protonix.  Physical  Examination     Vital signs:  BP 161/69  Pulse 68  Temp(Src) 98 F (36.7 C) (Oral)  Resp 18  SpO2 98% Gen:  Alert and oriented times 3.  In no distress. Musculoskeletal:  Exam of the foot reveals swelling, erythema, heat, and pain to palpation over the MTP joint of the right great toe, all other joints were normal, no pain to palpation of the ankle and heel.  Otherwise, all joints had a full a ROM with no swelling, bruising or deformity.  No edema, pulses full. Extremities were warm and pink.  Capillary refill was brisk.  Skin:  Clear, warm and dry.  No rash. Neuro:  Alert and oriented times 3.  Muscle strength was normal.  Sensation was intact to light touch.   Images independently and personally and concur with the radiologist's findings.   Labs   Results for orders placed during the hospital encounter of 06/03/14  URIC ACID      Result Value Ref Range   Uric Acid, Serum 4.0  2.4 - 7.0 mg/dL   Course in Urgent Care Center   Given  Toradol 60 mg IM and Depo-Medrol 80 mg IM.  Assessment   The encounter diagnosis was Acute gout of right foot, unspecified cause.  Plan    1.  Meds:  The following meds were prescribed:   Discharge Medication List as of 06/03/2014 11:40 AM    START taking these medications   Details  colchicine (COLCRYS) 0.6 MG tablet Take 2 at onset of attack, then 1 tab 1 hour later., Normal    oxyCODONE-acetaminophen (PERCOCET) 5-325 MG per tablet 1 to 2 tablets every 6 hours as needed for pain., Print        2.  Patient Education/Counseling:  The patient was given appropriate handouts, self care instructions, and instructed in symptomatic relief including rest and activity, elevation, application of ice and compression.    3.  Follow up:  The patient was told to follow up here if no better in 3 to 4 days, or sooner if becoming worse in any way, and given some red flag symptoms such as worsening pain or neurological symptoms which would prompt immediate return.  Follow up here as necessary.       Harden Mo, MD 06/03/14 2030

## 2014-06-03 NOTE — Progress Notes (Signed)
Quick Note:  Test result was normal. No further action is needed at this time. The patient was called and informed of this normal result. A copy was faxed to her primary care physician. She should take her medication for gout. A normal uric acid level does not rule out an acute gout attack. ______

## 2014-06-21 ENCOUNTER — Other Ambulatory Visit (HOSPITAL_COMMUNITY): Payer: Self-pay | Admitting: Internal Medicine

## 2014-06-21 DIAGNOSIS — Z1231 Encounter for screening mammogram for malignant neoplasm of breast: Secondary | ICD-10-CM

## 2014-07-14 ENCOUNTER — Ambulatory Visit (HOSPITAL_COMMUNITY)
Admission: RE | Admit: 2014-07-14 | Discharge: 2014-07-14 | Disposition: A | Discharge status: Home / Self Care | Payer: Medicare Other | Source: Ambulatory Visit | Attending: Internal Medicine | Admitting: Internal Medicine

## 2014-07-14 DIAGNOSIS — Z1231 Encounter for screening mammogram for malignant neoplasm of breast: Secondary | ICD-10-CM | POA: Insufficient documentation

## 2014-08-04 ENCOUNTER — Other Ambulatory Visit: Payer: Self-pay | Admitting: Specialist

## 2014-08-04 DIAGNOSIS — R569 Unspecified convulsions: Secondary | ICD-10-CM

## 2014-08-04 DIAGNOSIS — I739 Peripheral vascular disease, unspecified: Secondary | ICD-10-CM

## 2014-08-04 DIAGNOSIS — Z8673 Personal history of transient ischemic attack (TIA), and cerebral infarction without residual deficits: Secondary | ICD-10-CM

## 2014-08-11 ENCOUNTER — Ambulatory Visit
Admission: RE | Admit: 2014-08-11 | Discharge: 2014-08-11 | Disposition: A | Discharge status: Home / Self Care | Payer: Medicare Other | Source: Ambulatory Visit | Attending: Specialist | Admitting: Specialist

## 2014-08-11 DIAGNOSIS — R569 Unspecified convulsions: Secondary | ICD-10-CM

## 2014-08-11 DIAGNOSIS — Z8673 Personal history of transient ischemic attack (TIA), and cerebral infarction without residual deficits: Secondary | ICD-10-CM

## 2014-08-11 DIAGNOSIS — I739 Peripheral vascular disease, unspecified: Secondary | ICD-10-CM

## 2014-08-11 MED ORDER — GADOBENATE DIMEGLUMINE 529 MG/ML IV SOLN
10.0000 mL | Freq: Once | INTRAVENOUS | Status: AC | PRN
  Administered 2014-08-11: 10 mL via INTRAVENOUS

## 2014-09-01 ENCOUNTER — Encounter: Payer: Self-pay | Admitting: Cardiology

## 2014-09-14 ENCOUNTER — Emergency Department (HOSPITAL_COMMUNITY)
Admission: EM | Admit: 2014-09-14 | Discharge: 2014-09-14 | Disposition: A | Discharge status: Home / Self Care | Payer: Medicare Other | Attending: Emergency Medicine | Admitting: Emergency Medicine

## 2014-09-14 ENCOUNTER — Encounter (HOSPITAL_COMMUNITY): Payer: Self-pay | Admitting: Emergency Medicine

## 2014-09-14 DIAGNOSIS — M791 Myalgia, unspecified site: Secondary | ICD-10-CM

## 2014-09-14 DIAGNOSIS — Z720 Tobacco use: Secondary | ICD-10-CM | POA: Insufficient documentation

## 2014-09-14 DIAGNOSIS — M109 Gout, unspecified: Secondary | ICD-10-CM | POA: Diagnosis not present

## 2014-09-14 DIAGNOSIS — Z79899 Other long term (current) drug therapy: Secondary | ICD-10-CM | POA: Insufficient documentation

## 2014-09-14 DIAGNOSIS — Z7982 Long term (current) use of aspirin: Secondary | ICD-10-CM | POA: Diagnosis not present

## 2014-09-14 DIAGNOSIS — F419 Anxiety disorder, unspecified: Secondary | ICD-10-CM | POA: Insufficient documentation

## 2014-09-14 DIAGNOSIS — R42 Dizziness and giddiness: Secondary | ICD-10-CM | POA: Diagnosis present

## 2014-09-14 DIAGNOSIS — E119 Type 2 diabetes mellitus without complications: Secondary | ICD-10-CM | POA: Diagnosis not present

## 2014-09-14 DIAGNOSIS — M545 Low back pain: Secondary | ICD-10-CM | POA: Diagnosis not present

## 2014-09-14 DIAGNOSIS — Z791 Long term (current) use of non-steroidal anti-inflammatories (NSAID): Secondary | ICD-10-CM | POA: Insufficient documentation

## 2014-09-14 DIAGNOSIS — I10 Essential (primary) hypertension: Secondary | ICD-10-CM | POA: Diagnosis not present

## 2014-09-14 DIAGNOSIS — G8929 Other chronic pain: Secondary | ICD-10-CM | POA: Diagnosis not present

## 2014-09-14 LAB — URINALYSIS, DIPSTICK ONLY
Bilirubin Urine: NEGATIVE
Glucose, UA: NEGATIVE mg/dL
Hgb urine dipstick: NEGATIVE
KETONES UR: NEGATIVE mg/dL
Leukocytes, UA: NEGATIVE
NITRITE: NEGATIVE
Protein, ur: 100 mg/dL — AB
Specific Gravity, Urine: 1.004 — ABNORMAL LOW (ref 1.005–1.030)
UROBILINOGEN UA: 0.2 mg/dL (ref 0.0–1.0)
pH: 6.5 (ref 5.0–8.0)

## 2014-09-14 MED ORDER — FENTANYL CITRATE 0.05 MG/ML IJ SOLN
50.0000 ug | Freq: Once | INTRAMUSCULAR | Status: AC
  Administered 2014-09-14: 50 ug via INTRAMUSCULAR
  Filled 2014-09-14: qty 2

## 2014-09-14 MED ORDER — CYCLOBENZAPRINE HCL 10 MG PO TABS
10.0000 mg | ORAL_TABLET | Freq: Once | ORAL | Status: AC
  Administered 2014-09-14: 10 mg via ORAL
  Filled 2014-09-14: qty 1

## 2014-09-14 MED ORDER — ACETAMINOPHEN 500 MG PO TABS
500.0000 mg | ORAL_TABLET | Freq: Once | ORAL | Status: AC
  Administered 2014-09-14: 500 mg via ORAL
  Filled 2014-09-14: qty 1

## 2014-09-14 MED ORDER — ORPHENADRINE CITRATE 30 MG/ML IJ SOLN
60.0000 mg | Freq: Two times a day (BID) | INTRAMUSCULAR | Status: DC
  Filled 2014-09-14 (×2): qty 2

## 2014-09-14 NOTE — ED Notes (Signed)
Pt reports 3 day hx of headaches, body aches, back pain. Pt reports dizziness with standing. Pt awake, alert, oriented x4, NAd at present. Stroke scale neg.

## 2014-09-14 NOTE — ED Provider Notes (Signed)
CSN: [id number]     Arrival date & time 09/14/14  0957 History   First MD Initiated Contact with Patient 09/14/14 1121     Chief Complaint  Patient presents with  . Generalized Body Aches  . Dizziness     (Consider location/radiation/quality/duration/timing/severity/associated sxs/prior Treatment) Patient is a 58 y.o. female presenting with back pain. The history is provided by the patient. No language interpreter was used.  Back Pain Location:  Lumbar spine Quality:  Aching (sharp) Radiates to:  Does not radiate Pain severity:  Moderate Pain is:  Same all the time Onset quality:  Gradual Duration:  1 week Timing:  Constant Progression:  Worsening Chronicity:  Chronic Context: not twisting   Relieved by:  Nothing Associated symptoms: no abdominal pain, no chest pain, no dysuria, no fever and no headaches     Past Medical History  Diagnosis Date  . Diabetes mellitus   . Gout   . Chronic back pain   . Anxiety   . Hypertension   . Hyperlipidemia    Past Surgical History  Procedure Laterality Date  . Lymph gland excision    . Cervical fusion    . Cesarean section    . Shoulder surgery      left side   Family History  Problem Relation Age of Onset  . Diabetes Mother   . Heart disease Mother   . Kidney failure Mother   . Diabetes Sister    History  Substance Use Topics  . Smoking status: Current Every Day Smoker -- 0.20 packs/day for 40 years  . Smokeless tobacco: Former Systems developer    Types: Snuff, Chew    Quit date: 11/09/1966  . Alcohol Use: No   OB History   Grav Para Term Preterm Abortions TAB SAB Ect Mult Living                 Review of Systems  Constitutional: Negative for fever.  HENT: Negative for congestion, rhinorrhea and sore throat.   Respiratory: Negative for cough and shortness of breath.   Cardiovascular: Negative for chest pain.  Gastrointestinal: Negative for nausea, vomiting, abdominal pain and diarrhea.  Genitourinary: Negative for  dysuria and hematuria.  Musculoskeletal: Positive for back pain (chronic) and myalgias.  Skin: Negative for rash.  Neurological: Negative for syncope, light-headedness and headaches.  All other systems reviewed and are negative.     Allergies  Review of patient's allergies indicates no known allergies.  Home Medications   Prior to Admission medications   Medication Sig Start Date End Date Taking? Authorizing Provider  allopurinol (ZYLOPRIM) 100 MG tablet Take 100 mg by mouth daily.      Historical Provider, MD  ALPRAZolam Duanne Moron) 0.5 MG tablet Take 0.5 mg by mouth 3 (three) times daily as needed.     Historical Provider, MD  amLODipine (NORVASC) 10 MG tablet Take 10 mg by mouth daily.  03/08/14   Historical Provider, MD  aspirin 81 MG chewable tablet Chew 1 tablet (81 mg total) by mouth daily. 03/25/14   Tatyana A Kirichenko, PA-C  colchicine (COLCRYS) 0.6 MG tablet Take 2 at onset of attack, then 1 tab 1 hour later. 06/03/14   Harden Mo, MD  CRESTOR 20 MG tablet Take 20 mg by mouth every evening.  03/08/14   Historical Provider, MD  glimepiride (AMARYL) 2 MG tablet Take 2 mg by mouth every evening.  03/08/14   Historical Provider, MD  HYDROcodone-acetaminophen (VICODIN) 5-500 MG per tablet Take 1 tablet  by mouth every 8 (eight) hours as needed. For pain.    Historical Provider, MD  lisinopril (PRINIVIL,ZESTRIL) 10 MG tablet Take 1 tablet (10 mg total) by mouth daily. 04/25/14   Sueanne Margarita, MD  metFORMIN (GLUCOPHAGE) 1000 MG tablet Take 1,000 mg by mouth 2 (two) times daily with a meal.    Historical Provider, MD  naproxen (NAPROSYN) 500 MG tablet Take 500 mg by mouth 2 (two) times daily after a meal.  02/09/14   Historical Provider, MD  nitroGLYCERIN (NITROSTAT) 0.4 MG SL tablet Place 1 tablet (0.4 mg total) under the tongue every 5 (five) minutes as needed for chest pain. 04/26/14   Janece Canterbury, MD  oxyCODONE-acetaminophen (PERCOCET) 5-325 MG per tablet 1 to 2 tablets every 6 hours as  needed for pain. 06/03/14   Harden Mo, MD  pantoprazole (PROTONIX) 40 MG tablet Take 1 tablet (40 mg total) by mouth daily before breakfast. 05/17/14   Sueanne Margarita, MD   BP 181/75  Pulse 72  Temp(Src) 97.6 F (36.4 C) (Oral)  Resp 16  Ht 5\' 3"  (1.6 m)  Wt 130 lb (58.968 kg)  BMI 23.03 kg/m2  SpO2 100% Physical Exam  Nursing note and vitals reviewed. Constitutional: She is oriented to person, place, and time. She appears well-developed and well-nourished.  HENT:  Head: Normocephalic and atraumatic.  Right Ear: External ear normal.  Left Ear: External ear normal.  Eyes: EOM are normal.  Neck: Normal range of motion. Neck supple.  Cardiovascular: Normal rate, regular rhythm and intact distal pulses.  Exam reveals no gallop and no friction rub.   No murmur heard. Pulmonary/Chest: Effort normal and breath sounds normal. No respiratory distress. She has no wheezes. She has no rales. She exhibits no tenderness.  Abdominal: Soft. Bowel sounds are normal. She exhibits no distension. There is no tenderness. There is no rebound.  Musculoskeletal: Normal range of motion. She exhibits tenderness (lumbar spine). She exhibits no edema.  Lymphadenopathy:    She has no cervical adenopathy.  Neurological: She is alert and oriented to person, place, and time. She displays normal reflexes. No cranial nerve deficit. She exhibits normal muscle tone. Coordination normal.  Neurologic exam: CN I-XII: grossly intact, Sensation: normal in upper and lower extremities, Strength 5/5 in both upper and lower extremities, Coordination intact. Gait normal.  Skin: Skin is warm. No rash noted.  Psychiatric: She has a normal mood and affect. Her behavior is normal.    ED Course  Procedures (including critical care time) Labs Review Labs Reviewed  URINALYSIS, DIPSTICK ONLY - Abnormal; Notable for the following:    Specific Gravity, Urine 1.004 (*)    Protein, ur 100 (*)    All other components within normal  limits    Imaging Review No results found.   EKG Interpretation None      MDM   Final diagnoses:  Chronic lumbar pain  Myalgia    11:22 AM Pt is a 58 y.o. female with pertinent PMHX of DM, chronic back pain, HTN, HLD who presents to the ED with myalgias, chronic low back pain, headaches and lightheadedness. Patient deneis any incontinence or new injuries. Pain similar to chronic back pain. No IVDU no fevers. No URI symptoms. No nausea, vomiting or diarrhea to suggest flu. No hsitory of kidney stones. No hematuria or dysuri  On exam: well appearing, afebrile, lungs CTA. Abdomen soft, NT, Nd. Mild tenderness to right apraspinal muscles. Non focal neurologic exam. No concerning red flags for  discitis or cauda equina. Feels similar to chronic back pain not controlled on chronic vicodin. Endorses generalized myalgias headache and lightheadedness. recent CTA neck showing 40% stenosis of carotid. Been on crestor for years without change in dosing.  Patient with chronic back pain, similar to previous. Plan for IM fentanyl and IM norflex for pain control. Will check a UA dip to rule out hematuria. Will hold off on advanced imaging or plain films given no new falls. Consistent with chronic pain  Review of labs: UA dip:   12:45PM, feeling better, normal giat. Plan for discharge. Patient's BP 171 systopoic. patietn has not taken home emds today. Offered to give home meds, patietn delcined and would like to go home and take her home dose. Strict return precautions given  12:57 PM:  I have discussed the diagnosis/risks/treatment options with the patient and believe the pt to be eligible for discharge home to follow-up with PCP. We also discussed returning to the ED immediately if new or worsening sx occur. We discussed the sx which are most concerning (e.g., worsening symptoms) that necessitate immediate return. Any new prescriptions provided to the patient are listed below.   New Prescriptions    No medications on file    The patient appears reasonably screened and/or stabilized for discharge and I doubt any other medical condition or other [facility] requiring further screening, evaluation or treatment in the ED at this time prior to discharge . Pt in agreement with discharge plan. Return precautions given. Pt discharged VSS   Labs reviewed by myself and considered in medical decision making if ordered.   Pt was discussed with my attending, Dr. Michelle Nasuti, MD 09/14/14 [phone]

## 2014-09-14 NOTE — Discharge Instructions (Signed)
1. Come back if worsening symptoms 2. See your PCP in 3-5 days 3. Take your medications as directed Back Exercises Back exercises help treat and prevent back injuries. The goal is to increase your strength in your belly (abdominal) and back muscles. These exercises can also help with flexibility. Start these exercises when told by your doctor. HOME CARE Back exercises include: Pelvic Tilt.  Lie on your back with your knees bent. Tilt your pelvis until the lower part of your back is against the floor. Hold this position 5 to 10 sec. Repeat this exercise 5 to 10 times. Knee to Chest.  Pull 1 knee up against your chest and hold for 20 to 30 seconds. Repeat this with the other knee. This may be done with the other leg straight or bent, whichever feels better. Then, pull both knees up against your chest. Sit-Ups or Curl-Ups.  Bend your knees 90 degrees. Start with tilting your pelvis, and do a partial, slow sit-up. Only lift your upper half 30 to 45 degrees off the floor. Take at least 2 to 3 seonds for each sit-up. Do not do sit-ups with your knees out straight. If partial sit-ups are difficult, simply do the above but with only tightening your belly (abdominal) muscles and holding it as told. Hip-Lift.  Lie on your back with your knees flexed 90 degrees. Push down with your feet and shoulders as you raise your hips 2 inches off the floor. Hold for 10 seconds, repeat 5 to 10 times. Back Arches.  Lie on your stomach. Prop yourself up on bent elbows. Slowly press on your hands, causing an arch in your low back. Repeat 3 to 5 times. Shoulder-Lifts.  Lie face down with arms beside your body. Keep hips and belly pressed to floor as you slowly lift your head and shoulders off the floor. Do not overdo your exercises. Be careful in the beginning. Exercises may cause you some mild back discomfort. If the pain lasts for more than 15 minutes, stop the exercises until you see your doctor. Improvement with  exercise for back problems is slow.  Document Released: 12/13/2010 Document Revised: 02/02/2012 Document Reviewed: 09/11/2011 [facility] Patient Information 2015 [place], [place]. This information is not intended to replace advice given to you by your health care provider. Make sure you discuss any questions you have with your health care provider.

## 2014-09-14 NOTE — ED Notes (Signed)
MD at bedside. Pt presents to ED with complaints of body aches in her back, legs, neck, and shoulders. Pt denies nausea, vomiting, diarrhea, and abd pain. Pt does have chronic back pain, has been taking vicodin for years and that is not working anymore. Pt states she received a flu vaccine last Friday and symptoms gradually got worse since then. Pt states she went to the doctor last week and was switched to a different medicine for her back for nerve pain, she states that medicine made her feel crazy so she stopped taking it. Pt also has swelling to her face.

## 2014-09-14 NOTE — ED Notes (Addendum)
Pt given discharge instructions. Patient verbalizes understanding. Pt has someone coming to pick her up. Pt changing into her clothes at this time.

## 2014-09-14 NOTE — ED Notes (Signed)
Tech walking pt to restroom.

## 2014-09-15 NOTE — ED Provider Notes (Signed)
Medical screening examination/treatment/procedure(s) were performed by non-physician practitioner and as supervising physician I was immediately available for consultation/collaboration.   EKG Interpretation None        Ernestina Patches, MD 09/15/14 1704

## 2014-10-02 ENCOUNTER — Encounter: Payer: Self-pay | Admitting: Cardiology

## 2014-11-28 ENCOUNTER — Encounter: Payer: Self-pay | Admitting: Cardiology

## 2014-11-28 ENCOUNTER — Ambulatory Visit (INDEPENDENT_AMBULATORY_CARE_PROVIDER_SITE_OTHER): Payer: Medicare Other | Admitting: Cardiology

## 2014-11-28 VITALS — BP 148/66 | HR 72 | Ht 63.0 in | Wt 138.0 lb

## 2014-11-28 DIAGNOSIS — R079 Chest pain, unspecified: Secondary | ICD-10-CM

## 2014-11-28 DIAGNOSIS — R0989 Other specified symptoms and signs involving the circulatory and respiratory systems: Secondary | ICD-10-CM

## 2014-11-28 DIAGNOSIS — E785 Hyperlipidemia, unspecified: Secondary | ICD-10-CM

## 2014-11-28 DIAGNOSIS — I779 Disorder of arteries and arterioles, unspecified: Secondary | ICD-10-CM

## 2014-11-28 DIAGNOSIS — I739 Peripheral vascular disease, unspecified: Secondary | ICD-10-CM

## 2014-11-28 DIAGNOSIS — I10 Essential (primary) hypertension: Secondary | ICD-10-CM

## 2014-11-28 NOTE — Patient Instructions (Addendum)
Your physician has recommended you make the following change in your medication:  1) Take your amlodipine in the morning  Your physician has requested that you have a carotid duplex. This test is an ultrasound of the carotid arteries in your neck. It looks at blood flow through these arteries that supply the brain with blood. Allow one hour for this exam. There are no restrictions or special instructions.  Your physician wants you to follow-up in: 6 months with Dr. Radford Pax. You will receive a reminder letter in the mail two months in advance. If you don't receive a letter, please call our office to schedule the follow-up appointment.

## 2014-11-28 NOTE — Progress Notes (Signed)
[place], [place], [place]  [address] Phone: [phone] Fax:  [phone]  Date:  11/28/2014   ID:  Alejandra Hernandez, DOB 1956-04-15, MRN [id number]  PCP:  Antonietta Jewel, MD  Cardiologist:  Fransico Him, MD    History of Present Illness: Alejandra Hernandez is a 59 y.o. female with a history of HTN, DM and atypical CP and SOB with normal nuclear stress test and echo.  She has been on Protonix.  She says that her CP has improved significantly.  Her main complaint is her back pain and neuropathy.  Sporadically she will have some CP but it is relieved with a SL NTG.  She denies any SOB.  It was felt that she probably has esophageal spasm as well as poorly controlled HTN.  She is doing much better now.      Wt Readings from Last 3 Encounters:  11/28/14 138 lb (62.596 kg)  09/14/14 130 lb (58.968 kg)  05/17/14 129 lb 12.8 oz (58.877 kg)     Past Medical History  Diagnosis Date  . Diabetes mellitus   . Gout   . Chronic back pain   . Anxiety   . Hypertension   . Hyperlipidemia     Current Outpatient Prescriptions  Medication Sig Dispense Refill  . allopurinol (ZYLOPRIM) 100 MG tablet Take 100 mg by mouth daily.      Marland Kitchen ALPRAZolam (XANAX) 0.5 MG tablet Take 0.5 mg by mouth 3 (three) times daily as needed for anxiety.     Marland Kitchen amLODipine (NORVASC) 10 MG tablet Take 10 mg by mouth daily.     Marland Kitchen aspirin 81 MG chewable tablet Chew 1 tablet (81 mg total) by mouth daily. 30 tablet 0  . cloNIDine (CATAPRES) 0.1 MG tablet Take 0.1 mg by mouth 2 (two) times daily.   1  . clopidogrel (PLAVIX) 75 MG tablet Take 75 mg by mouth daily.    . colchicine (COLCRYS) 0.6 MG tablet Take 2 at onset of attack, then 1 tab 1 hour later. 12 tablet 0  . CRESTOR 20 MG tablet Take 20 mg by mouth every evening.     Marland Kitchen glimepiride (AMARYL) 2 MG tablet Take 2 mg by mouth as needed.     Marland Kitchen HYDROcodone-acetaminophen (NORCO/VICODIN) 5-325 MG per tablet Take 1 tablet by mouth every 8 (eight) hours as needed for  moderate pain.    Marland Kitchen lisinopril (PRINIVIL,ZESTRIL) 20 MG tablet Take 20 mg by mouth.   1  . metFORMIN (GLUCOPHAGE) 1000 MG tablet Take 1,000 mg by mouth 2 (two) times daily with a meal.    . naproxen (NAPROSYN) 500 MG tablet Take 500 mg by mouth 2 (two) times daily after a meal.     . nitroGLYCERIN (NITROSTAT) 0.4 MG SL tablet Place 1 tablet (0.4 mg total) under the tongue every 5 (five) minutes as needed for chest pain. 30 tablet 0  . pantoprazole (PROTONIX) 40 MG tablet Take 1 tablet (40 mg total) by mouth daily before breakfast. 30 tablet 6   No current facility-administered medications for this visit.    Allergies:   No Known Allergies  Social History:  The patient  reports that she has been smoking.  She quit smokeless tobacco use about 48 years ago. Her smokeless tobacco use included Snuff and Chew. She reports that she does not drink alcohol or use illicit drugs.   Family History:  The patient's family history includes Diabetes in her mother and sister; Heart disease in  her mother; Kidney failure in her mother.   ROS:  Please see the history of present illness.      All other systems reviewed and negative.   PHYSICAL EXAM: VS:  BP 148/66 mmHg  Pulse 72  Ht 5\' 3"  (1.6 m)  Wt 138 lb (62.596 kg)  BMI 24.45 kg/m2 Well nourished, well developed, in no acute distress HEENT: normal Neck: no JVD Cardiac:  normal S1, S2; RRR; no murmur Lungs:  clear to auscultation bilaterally, no wheezing, rhonchi or rales Abd: soft, nontender, no hepatomegaly Ext: no edema Skin: warm and dry Neuro:  CNs 2-12 intact, no focal abnormalities noted     ASSESSMENT AND PLAN:  1. Atypical chest pain/SOB with normal EKG but several cardiac risk factors including HTN, DM, dyslipidemia, post menopausal state, ongoing tobacco use. Nuclear stress test was normal and echo showed normal LVF - I suspect that her CP is related to GERD and esophageal spasm. She will continue Protonix. 2. HTN - borderline  controlled but she has not taken her amlodipnine   - I have instructed her to take her amlodipine in the am with her other meds  - continue amlodipine/Lisinopril/Clonidine  - I have asked her to check her BP daily for a week and call with results 3.  Dyslipidemia - continue Crestor  - I will get a copy of her lipids from her PCP       4.   Ongoing tobacco abuse - she is trying to quit        5.   Carotid artery bruit with moderate carotid artery stenosis.  - continue ASA/Plavix - repeat dopplers    Followup with me in 6 months    Signed, Fransico Him, MD [facility] 11/28/2014 1:58 PM

## 2014-12-08 ENCOUNTER — Ambulatory Visit (HOSPITAL_COMMUNITY): Payer: Medicare Other | Attending: Cardiology | Admitting: Cardiology

## 2014-12-08 DIAGNOSIS — I779 Disorder of arteries and arterioles, unspecified: Secondary | ICD-10-CM | POA: Diagnosis present

## 2014-12-08 DIAGNOSIS — R0989 Other specified symptoms and signs involving the circulatory and respiratory systems: Secondary | ICD-10-CM | POA: Insufficient documentation

## 2014-12-08 DIAGNOSIS — I739 Peripheral vascular disease, unspecified: Secondary | ICD-10-CM

## 2014-12-08 NOTE — Progress Notes (Signed)
Carotid duplex performed 

## 2014-12-12 ENCOUNTER — Telehealth: Payer: Self-pay

## 2014-12-12 DIAGNOSIS — I739 Peripheral vascular disease, unspecified: Principal | ICD-10-CM

## 2014-12-12 DIAGNOSIS — I779 Disorder of arteries and arterioles, unspecified: Secondary | ICD-10-CM

## 2014-12-12 NOTE — Telephone Encounter (Signed)
Patient dropped off blood pressure log:  1/8: 110/70 1/9: 129/60 1/11: 110/60 1/12: 141/61 (per paper, st "pain") 1/13: 114/56 1/15: R 124/50          L  120/50  To Dr. Radford Pax for review.

## 2014-12-13 NOTE — Telephone Encounter (Signed)
-----   Message from Sueanne Margarita, MD sent at 12/08/2014  2:21 PM EST ----- 60-80% RICA and 27-51% LICA stenosis with bilateral subclavian artery stenosis with no steal - repeat scan in 6 months and continue ASA

## 2014-12-13 NOTE — Telephone Encounter (Signed)
Patient informed of results and verbal understanding expressed.  Instructed patient to continue ASA. Informed her a repeat duplex will be done in 6 months. Patient agrees with treatment plan.

## 2014-12-25 ENCOUNTER — Encounter: Payer: Self-pay | Admitting: Cardiology

## 2014-12-27 ENCOUNTER — Telehealth: Payer: Self-pay | Admitting: *Deleted

## 2014-12-27 DIAGNOSIS — E7800 Pure hypercholesterolemia, unspecified: Secondary | ICD-10-CM

## 2014-12-27 MED ORDER — ROSUVASTATIN CALCIUM 40 MG PO TABS: 40.0000 mg | ORAL_TABLET | Freq: Every day | ORAL | Status: DC

## 2014-12-27 NOTE — Telephone Encounter (Signed)
Patient notified of lab results, medication change recommendation and repeat labs in 6 weeks from Dr. Radford Pax and answered questions regarding Carotid Doppler results from 12/08/2014. Patient verbalized understanding and agreement with current treatment plan. Rx and labs ordered per Dr. Theodosia Blender request - "LDL goal <70 due to HTN and PVD with carotid artery stenosis - increase Crestor to 40mg  daily and recheck FLP and ALT in 6 weeks."

## 2015-03-13 ENCOUNTER — Encounter: Payer: Self-pay | Admitting: Cardiology

## 2015-06-15 ENCOUNTER — Ambulatory Visit (HOSPITAL_COMMUNITY): Payer: Medicare Other | Attending: Cardiology

## 2015-06-15 DIAGNOSIS — I739 Peripheral vascular disease, unspecified: Secondary | ICD-10-CM

## 2015-06-15 DIAGNOSIS — I779 Disorder of arteries and arterioles, unspecified: Secondary | ICD-10-CM

## 2015-06-15 DIAGNOSIS — I6523 Occlusion and stenosis of bilateral carotid arteries: Secondary | ICD-10-CM | POA: Insufficient documentation

## 2015-06-18 ENCOUNTER — Telehealth: Payer: Self-pay

## 2015-06-18 DIAGNOSIS — I779 Disorder of arteries and arterioles, unspecified: Secondary | ICD-10-CM

## 2015-06-18 DIAGNOSIS — I739 Peripheral vascular disease, unspecified: Principal | ICD-10-CM

## 2015-06-18 NOTE — Telephone Encounter (Signed)
-----   Message from Sueanne Margarita, MD sent at 06/16/2015  5:57 PM EDT ----- Essentially stable, irregular, mixed plaque.  60-79% bilateral ICA stenoses, (essentially stable over serial exams).  Normal subclavian arteries, bilaterally.  Patent vertebral arteries with antegrade flow.  f/u 1 year with repeat study

## 2015-06-18 NOTE — Telephone Encounter (Signed)
Informed patient of results and verbal understanding expressed.  Repeat carotid ordered to be scheduled in 1 year. Patient agrees with treatment plan.

## 2015-07-11 ENCOUNTER — Other Ambulatory Visit (HOSPITAL_COMMUNITY): Payer: Self-pay | Admitting: Internal Medicine

## 2015-07-11 ENCOUNTER — Ambulatory Visit (HOSPITAL_COMMUNITY)
Admission: RE | Admit: 2015-07-11 | Discharge: 2015-07-11 | Disposition: A | Discharge status: Home / Self Care | Payer: Medicare Other | Source: Ambulatory Visit | Attending: Internal Medicine | Admitting: Internal Medicine

## 2015-07-11 DIAGNOSIS — R071 Chest pain on breathing: Secondary | ICD-10-CM

## 2015-07-11 DIAGNOSIS — R531 Weakness: Secondary | ICD-10-CM | POA: Diagnosis not present

## 2015-07-11 DIAGNOSIS — R0602 Shortness of breath: Secondary | ICD-10-CM | POA: Insufficient documentation

## 2015-07-11 DIAGNOSIS — Z1231 Encounter for screening mammogram for malignant neoplasm of breast: Secondary | ICD-10-CM

## 2015-07-20 ENCOUNTER — Ambulatory Visit (HOSPITAL_COMMUNITY)
Admission: RE | Admit: 2015-07-20 | Discharge: 2015-07-20 | Disposition: A | Discharge status: Home / Self Care | Payer: Medicare Other | Source: Ambulatory Visit | Attending: Internal Medicine | Admitting: Internal Medicine

## 2015-07-20 DIAGNOSIS — Z1231 Encounter for screening mammogram for malignant neoplasm of breast: Secondary | ICD-10-CM | POA: Insufficient documentation

## 2015-11-25 DIAGNOSIS — Z9289 Personal history of other medical treatment: Secondary | ICD-10-CM

## 2015-11-25 HISTORY — DX: Personal history of other medical treatment: Z92.89

## 2016-01-16 ENCOUNTER — Emergency Department (INDEPENDENT_AMBULATORY_CARE_PROVIDER_SITE_OTHER)
Admission: EM | Admit: 2016-01-16 | Discharge: 2016-01-16 | Disposition: A | Discharge status: Home / Self Care | Payer: Medicare Other | Source: Home / Self Care | Attending: Family Medicine | Admitting: Family Medicine

## 2016-01-16 ENCOUNTER — Encounter (HOSPITAL_COMMUNITY): Payer: Self-pay | Admitting: Emergency Medicine

## 2016-01-16 DIAGNOSIS — R1011 Right upper quadrant pain: Secondary | ICD-10-CM

## 2016-01-16 DIAGNOSIS — R109 Unspecified abdominal pain: Secondary | ICD-10-CM

## 2016-01-16 LAB — POCT URINALYSIS DIP (DEVICE)
BILIRUBIN URINE: NEGATIVE
Glucose, UA: 100 mg/dL — AB
HGB URINE DIPSTICK: NEGATIVE
Ketones, ur: NEGATIVE mg/dL
Nitrite: NEGATIVE
PROTEIN: NEGATIVE mg/dL
Specific Gravity, Urine: 1.01 (ref 1.005–1.030)
Urobilinogen, UA: 0.2 mg/dL (ref 0.0–1.0)
pH: 5.5 (ref 5.0–8.0)

## 2016-01-16 NOTE — ED Notes (Signed)
Right back and right flank pain that started last week.  Patient has pain medicines that she takes for chronic back pain.  Burning at end of urine stream

## 2016-01-16 NOTE — Discharge Instructions (Signed)
Flank Pain °Flank pain refers to pain that is located on the side of the body between the upper abdomen and the back. The pain may occur over a short period of time (acute) or may be long-term or reoccurring (chronic). It may be mild or severe. Flank pain can be caused by many things. °CAUSES  °Some of the more common causes of flank pain include: °· Muscle strains.   °· Muscle spasms.   °· A disease of your spine (vertebral disk disease).   °· A lung infection (pneumonia).   °· Fluid around your lungs (pulmonary edema).   °· A kidney infection.   °· Kidney stones.   °· A very painful skin rash caused by the chickenpox virus (shingles).   °· Gallbladder disease.   °HOME CARE INSTRUCTIONS  °Home care will depend on the cause of your pain. In general, °· Rest as directed by your caregiver. °· Drink enough fluids to keep your urine clear or pale yellow. °· Only take over-the-counter or prescription medicines as directed by your caregiver. Some medicines may help relieve the pain. °· Tell your caregiver about any changes in your pain. °· Follow up with your caregiver as directed. °SEEK IMMEDIATE MEDICAL CARE IF:  °· Your pain is not controlled with medicine.   °· You have new or worsening symptoms. °· Your pain increases.   °· You have abdominal pain.   °· You have shortness of breath.   °· You have persistent nausea or vomiting.   °· You have swelling in your abdomen.   °· You feel faint or pass out.   °· You have blood in your urine. °· You have a fever or persistent symptoms for more than 2-3 days. °· You have a fever and your symptoms suddenly get worse. °MAKE SURE YOU:  °· Understand these instructions. °· Will watch your condition. °· Will get help right away if you are not doing well or get worse. °  °This information is not intended to replace advice given to you by your health care provider. Make sure you discuss any questions you have with your health care provider. °  °Document Released: 01/01/2006 Document  Revised: 08/04/2012 Document Reviewed: 06/24/2012 °Elsevier Interactive Patient Education ©2016 [employer] ° °

## 2016-01-18 NOTE — ED Provider Notes (Signed)
CSN: [id number]     Arrival date & time 01/16/16  1456 History   First MD Initiated Contact with Patient 01/16/16 1630     Chief Complaint  Patient presents with  . Back Pain   (Consider location/radiation/quality/duration/timing/severity/associated sxs/prior Treatment) HPI History obtained from patient:   LOCATION: flank pain SEVERITY:4 DURATION:2 days CONTEXT: sudden onset QUALITY:ache MODIFYING FACTORS:OTC meds without relief ASSOCIATED SYMPTOMS: none TIMING:constant OCCUPATION:  Past Medical History  Diagnosis Date  . Diabetes mellitus   . Gout   . Chronic back pain   . Anxiety   . Hypertension   . Hyperlipidemia    Past Surgical History  Procedure Laterality Date  . Lymph gland excision    . Cervical fusion    . Cesarean section    . Shoulder surgery      left side   Family History  Problem Relation Age of Onset  . Diabetes Mother   . Heart disease Mother   . Kidney failure Mother   . Diabetes Sister    Social History  Substance Use Topics  . Smoking status: Current Every Day Smoker -- 0.20 packs/day for 40 years  . Smokeless tobacco: Former Systems developer    Types: Snuff, Chew    Quit date: 11/09/1966  . Alcohol Use: No   OB History    No data available     Review of Systems Flank pain right Allergies  Review of patient's allergies indicates no known allergies.  Home Medications   Prior to Admission medications   Medication Sig Start Date End Date Taking? Authorizing Provider  allopurinol (ZYLOPRIM) 100 MG tablet Take 100 mg by mouth daily.      Historical Provider, MD  ALPRAZolam Duanne Moron) 0.5 MG tablet Take 0.5 mg by mouth 3 (three) times daily as needed for anxiety.     Historical Provider, MD  amLODipine (NORVASC) 10 MG tablet Take 10 mg by mouth daily.  03/08/14   Historical Provider, MD  aspirin 81 MG chewable tablet Chew 1 tablet (81 mg total) by mouth daily. 03/25/14   Tatyana Kirichenko, PA-C  cloNIDine (CATAPRES) 0.1 MG tablet Take 0.1 mg by  mouth 2 (two) times daily.  11/01/14   Historical Provider, MD  clopidogrel (PLAVIX) 75 MG tablet Take 75 mg by mouth daily. 08/30/14   Historical Provider, MD  colchicine (COLCRYS) 0.6 MG tablet Take 2 at onset of attack, then 1 tab 1 hour later. 06/03/14   Harden Mo, MD  glimepiride (AMARYL) 2 MG tablet Take 2 mg by mouth as needed.  03/08/14   Historical Provider, MD  HYDROcodone-acetaminophen (NORCO/VICODIN) 5-325 MG per tablet Take 1 tablet by mouth every 8 (eight) hours as needed for moderate pain.    Historical Provider, MD  lisinopril (PRINIVIL,ZESTRIL) 20 MG tablet Take 20 mg by mouth.  11/01/14   Historical Provider, MD  metFORMIN (GLUCOPHAGE) 1000 MG tablet Take 1,000 mg by mouth 2 (two) times daily with a meal.    Historical Provider, MD  naproxen (NAPROSYN) 500 MG tablet Take 500 mg by mouth 2 (two) times daily after a meal.  02/09/14   Historical Provider, MD  nitroGLYCERIN (NITROSTAT) 0.4 MG SL tablet Place 1 tablet (0.4 mg total) under the tongue every 5 (five) minutes as needed for chest pain. 04/26/14   Janece Canterbury, MD  pantoprazole (PROTONIX) 40 MG tablet Take 1 tablet (40 mg total) by mouth daily before breakfast. 05/17/14   Sueanne Margarita, MD  rosuvastatin (CRESTOR) 40 MG tablet Take 1  tablet (40 mg total) by mouth daily. 12/27/14   Sueanne Margarita, MD   Meds Ordered and Administered this Visit  Medications - No data to display  BP 160/64 mmHg  Pulse 90  Temp(Src) 97.9 F (36.6 C) (Oral)  Resp 20  SpO2 100% No data found.   Physical Exam NURSES NOTES AND VITAL SIGNS REVIEWED. CONSTITUTIONAL: Well developed, well nourished, no acute distress HEENT: normocephalic, atraumatic, right and left TM's are normal EYES: Conjunctiva normal NECK:normal ROM, supple, no adenopathy PULMONARY:No respiratory distress, normal effort, Lungs: CTAb/l, no wheezes, or increased work of breathing CARDIOVASCULAR: RRR, no murmur ABDOMEN: soft, ND, NT, +'ve BS, without CVA -T  (right) MUSCULOSKELETAL: Normal ROM of all extremities,  SKIN: warm and dry without rash PSYCHIATRIC: Mood and affect, behavior are normal  ED Course  Procedures (including critical care time)  Labs Review Labs Reviewed  POCT URINALYSIS DIP (DEVICE) - Abnormal; Notable for the following:    Glucose, UA 100 (*)    Leukocytes, UA TRACE (*)    All other components within normal limits    Imaging Review No results found.   Visual Acuity Review  Right Eye Distance:   Left Eye Distance:   Bilateral Distance:    Right Eye Near:   Left Eye Near:    Bilateral Near:        Discussed results of UA with pt. MDM   1. Acute right flank pain    Urine culture pending  Patient is reassured that there is no indication for more advance testing at this time.  Patient is advised to continue home symptomatic treatment.  Patient is advised that if there are new or worsening symptoms or attend the emergency department, or contact primary care provider. Instructions of care provided discharged home in stable condition. Return to work/school note provided.  THIS NOTE WAS GENERATED USING A VOICE RECOGNITION SOFTWARE PROGRAM. ALL REASONABLE EFFORTS  WERE MADE TO PROOFREAD THIS DOCUMENT FOR ACCURACY. :      Konrad Felix, PA 01/18/16 1046

## 2016-01-23 DIAGNOSIS — N183 Chronic kidney disease, stage 3 unspecified: Secondary | ICD-10-CM

## 2016-01-23 DIAGNOSIS — K254 Chronic or unspecified gastric ulcer with hemorrhage: Secondary | ICD-10-CM

## 2016-01-23 DIAGNOSIS — K922 Gastrointestinal hemorrhage, unspecified: Secondary | ICD-10-CM

## 2016-01-23 HISTORY — DX: Gastrointestinal hemorrhage, unspecified: K92.2

## 2016-01-23 HISTORY — DX: Chronic kidney disease, stage 3 unspecified: N18.30

## 2016-01-23 HISTORY — DX: Chronic or unspecified gastric ulcer with hemorrhage: K25.4

## 2016-02-11 ENCOUNTER — Encounter (HOSPITAL_COMMUNITY): Payer: Self-pay | Admitting: Emergency Medicine

## 2016-02-11 ENCOUNTER — Emergency Department (HOSPITAL_COMMUNITY): Payer: Medicare Other

## 2016-02-11 ENCOUNTER — Inpatient Hospital Stay (HOSPITAL_COMMUNITY)
Admission: EM | Admit: 2016-02-11 | Discharge: 2016-02-13 | DRG: 378 | Disposition: A | Discharge status: Home / Self Care | Payer: Medicare Other | Attending: Family Medicine | Admitting: Family Medicine

## 2016-02-11 DIAGNOSIS — K269 Duodenal ulcer, unspecified as acute or chronic, without hemorrhage or perforation: Secondary | ICD-10-CM | POA: Diagnosis present

## 2016-02-11 DIAGNOSIS — E1310 Other specified diabetes mellitus with ketoacidosis without coma: Secondary | ICD-10-CM | POA: Diagnosis not present

## 2016-02-11 DIAGNOSIS — R195 Other fecal abnormalities: Secondary | ICD-10-CM | POA: Diagnosis not present

## 2016-02-11 DIAGNOSIS — K449 Diaphragmatic hernia without obstruction or gangrene: Secondary | ICD-10-CM | POA: Diagnosis present

## 2016-02-11 DIAGNOSIS — Z833 Family history of diabetes mellitus: Secondary | ICD-10-CM | POA: Diagnosis not present

## 2016-02-11 DIAGNOSIS — N179 Acute kidney failure, unspecified: Secondary | ICD-10-CM | POA: Insufficient documentation

## 2016-02-11 DIAGNOSIS — Z7984 Long term (current) use of oral hypoglycemic drugs: Secondary | ICD-10-CM

## 2016-02-11 DIAGNOSIS — K921 Melena: Secondary | ICD-10-CM | POA: Diagnosis present

## 2016-02-11 DIAGNOSIS — F419 Anxiety disorder, unspecified: Secondary | ICD-10-CM | POA: Diagnosis present

## 2016-02-11 DIAGNOSIS — G8929 Other chronic pain: Secondary | ICD-10-CM | POA: Diagnosis present

## 2016-02-11 DIAGNOSIS — Z7902 Long term (current) use of antithrombotics/antiplatelets: Secondary | ICD-10-CM | POA: Diagnosis not present

## 2016-02-11 DIAGNOSIS — E1110 Type 2 diabetes mellitus with ketoacidosis without coma: Secondary | ICD-10-CM | POA: Insufficient documentation

## 2016-02-11 DIAGNOSIS — M109 Gout, unspecified: Secondary | ICD-10-CM | POA: Diagnosis present

## 2016-02-11 DIAGNOSIS — Z791 Long term (current) use of non-steroidal anti-inflammatories (NSAID): Secondary | ICD-10-CM

## 2016-02-11 DIAGNOSIS — T45525A Adverse effect of antithrombotic drugs, initial encounter: Secondary | ICD-10-CM | POA: Diagnosis present

## 2016-02-11 DIAGNOSIS — T39395A Adverse effect of other nonsteroidal anti-inflammatory drugs [NSAID], initial encounter: Secondary | ICD-10-CM | POA: Diagnosis present

## 2016-02-11 DIAGNOSIS — E785 Hyperlipidemia, unspecified: Secondary | ICD-10-CM | POA: Diagnosis present

## 2016-02-11 DIAGNOSIS — R079 Chest pain, unspecified: Secondary | ICD-10-CM | POA: Diagnosis not present

## 2016-02-11 DIAGNOSIS — I739 Peripheral vascular disease, unspecified: Secondary | ICD-10-CM | POA: Diagnosis present

## 2016-02-11 DIAGNOSIS — Z7982 Long term (current) use of aspirin: Secondary | ICD-10-CM | POA: Diagnosis not present

## 2016-02-11 DIAGNOSIS — Z8249 Family history of ischemic heart disease and other diseases of the circulatory system: Secondary | ICD-10-CM | POA: Diagnosis not present

## 2016-02-11 DIAGNOSIS — M549 Dorsalgia, unspecified: Secondary | ICD-10-CM | POA: Diagnosis present

## 2016-02-11 DIAGNOSIS — D62 Acute posthemorrhagic anemia: Secondary | ICD-10-CM | POA: Diagnosis present

## 2016-02-11 DIAGNOSIS — R0789 Other chest pain: Secondary | ICD-10-CM

## 2016-02-11 DIAGNOSIS — K219 Gastro-esophageal reflux disease without esophagitis: Secondary | ICD-10-CM | POA: Diagnosis present

## 2016-02-11 DIAGNOSIS — R55 Syncope and collapse: Secondary | ICD-10-CM

## 2016-02-11 DIAGNOSIS — E869 Volume depletion, unspecified: Secondary | ICD-10-CM | POA: Diagnosis present

## 2016-02-11 DIAGNOSIS — I6529 Occlusion and stenosis of unspecified carotid artery: Secondary | ICD-10-CM | POA: Diagnosis present

## 2016-02-11 DIAGNOSIS — Z794 Long term (current) use of insulin: Secondary | ICD-10-CM

## 2016-02-11 DIAGNOSIS — E119 Type 2 diabetes mellitus without complications: Secondary | ICD-10-CM | POA: Diagnosis present

## 2016-02-11 DIAGNOSIS — I10 Essential (primary) hypertension: Secondary | ICD-10-CM | POA: Diagnosis present

## 2016-02-11 DIAGNOSIS — B9681 Helicobacter pylori [H. pylori] as the cause of diseases classified elsewhere: Secondary | ICD-10-CM | POA: Diagnosis present

## 2016-02-11 DIAGNOSIS — D649 Anemia, unspecified: Secondary | ICD-10-CM | POA: Diagnosis present

## 2016-02-11 HISTORY — DX: Gastrointestinal hemorrhage, unspecified: K92.2

## 2016-02-11 HISTORY — DX: Disorder of arteries and arterioles, unspecified: I77.9

## 2016-02-11 HISTORY — DX: Peripheral vascular disease, unspecified: I73.9

## 2016-02-11 LAB — URINALYSIS, ROUTINE W REFLEX MICROSCOPIC
Bilirubin Urine: NEGATIVE
Glucose, UA: NEGATIVE mg/dL
HGB URINE DIPSTICK: NEGATIVE
KETONES UR: NEGATIVE mg/dL
Nitrite: NEGATIVE
PROTEIN: NEGATIVE mg/dL
Specific Gravity, Urine: 1.01 (ref 1.005–1.030)
pH: 5.5 (ref 5.0–8.0)

## 2016-02-11 LAB — URINE MICROSCOPIC-ADD ON

## 2016-02-11 LAB — CBC WITH DIFFERENTIAL/PLATELET
BASOS ABS: 0 10*3/uL (ref 0.0–0.1)
Basophils Relative: 0 %
EOS ABS: 0.1 10*3/uL (ref 0.0–0.7)
Eosinophils Relative: 1 %
HCT: 19.8 % — ABNORMAL LOW (ref 36.0–46.0)
Hemoglobin: 6.2 g/dL — CL (ref 12.0–15.0)
LYMPHS PCT: 32 %
Lymphs Abs: 4.3 10*3/uL — ABNORMAL HIGH (ref 0.7–4.0)
MCH: 22.1 pg — ABNORMAL LOW (ref 26.0–34.0)
MCHC: 31.3 g/dL (ref 30.0–36.0)
MCV: 70.7 fL — ABNORMAL LOW (ref 78.0–100.0)
MONO ABS: 0.9 10*3/uL (ref 0.1–1.0)
Monocytes Relative: 7 %
NEUTROS PCT: 60 %
Neutro Abs: 8.1 10*3/uL — ABNORMAL HIGH (ref 1.7–7.7)
PLATELETS: 367 10*3/uL (ref 150–400)
RBC: 2.8 MIL/uL — ABNORMAL LOW (ref 3.87–5.11)
RDW: 14.1 % (ref 11.5–15.5)
WBC: 13.4 10*3/uL — AB (ref 4.0–10.5)

## 2016-02-11 LAB — COMPREHENSIVE METABOLIC PANEL
ALT: 16 U/L (ref 14–54)
ANION GAP: 10 (ref 5–15)
AST: 21 U/L (ref 15–41)
Albumin: 3.3 g/dL — ABNORMAL LOW (ref 3.5–5.0)
Alkaline Phosphatase: 86 U/L (ref 38–126)
BUN: 48 mg/dL — ABNORMAL HIGH (ref 6–20)
CHLORIDE: 104 mmol/L (ref 101–111)
CO2: 18 mmol/L — AB (ref 22–32)
Calcium: 8.8 mg/dL — ABNORMAL LOW (ref 8.9–10.3)
Creatinine, Ser: 1.35 mg/dL — ABNORMAL HIGH (ref 0.44–1.00)
GFR calc non Af Amer: 42 mL/min — ABNORMAL LOW (ref >60)
GFR, EST AFRICAN AMERICAN: 49 mL/min — AB (ref >60)
Glucose, Bld: 224 mg/dL — ABNORMAL HIGH (ref 65–99)
POTASSIUM: 4.8 mmol/L (ref 3.5–5.1)
SODIUM: 132 mmol/L — AB (ref 135–145)
Total Bilirubin: 0.2 mg/dL — ABNORMAL LOW (ref 0.3–1.2)
Total Protein: 6.1 g/dL — ABNORMAL LOW (ref 6.5–8.1)

## 2016-02-11 LAB — CBC
HEMATOCRIT: 24.6 % — AB (ref 36.0–46.0)
HEMOGLOBIN: 7.9 g/dL — AB (ref 12.0–15.0)
MCH: 24.2 pg — ABNORMAL LOW (ref 26.0–34.0)
MCHC: 32.1 g/dL (ref 30.0–36.0)
MCV: 75.5 fL — AB (ref 78.0–100.0)
Platelets: 254 10*3/uL (ref 150–400)
RBC: 3.26 MIL/uL — ABNORMAL LOW (ref 3.87–5.11)
RDW: 15.7 % — ABNORMAL HIGH (ref 11.5–15.5)
WBC: 13.7 10*3/uL — ABNORMAL HIGH (ref 4.0–10.5)

## 2016-02-11 LAB — TROPONIN I

## 2016-02-11 LAB — D-DIMER, QUANTITATIVE (NOT AT ARMC): D DIMER QUANT: 0.56 ug{FEU}/mL — AB (ref 0.00–0.50)

## 2016-02-11 LAB — PREPARE RBC (CROSSMATCH)

## 2016-02-11 LAB — POC OCCULT BLOOD, ED: FECAL OCCULT BLD: POSITIVE — AB

## 2016-02-11 LAB — GLUCOSE, CAPILLARY: Glucose-Capillary: 180 mg/dL — ABNORMAL HIGH (ref 65–99)

## 2016-02-11 LAB — ABO/RH: ABO/RH(D): A NEG

## 2016-02-11 LAB — CBG MONITORING, ED: GLUCOSE-CAPILLARY: 199 mg/dL — AB (ref 65–99)

## 2016-02-11 LAB — LIPASE, BLOOD: LIPASE: 31 U/L (ref 11–51)

## 2016-02-11 MED ORDER — SODIUM CHLORIDE 0.9 % IV SOLN: INTRAVENOUS | Status: DC

## 2016-02-11 MED ORDER — ROSUVASTATIN CALCIUM 10 MG PO TABS
40.0000 mg | ORAL_TABLET | Freq: Every day | ORAL | Status: DC
  Administered 2016-02-11 – 2016-02-12 (×2): 40 mg via ORAL
  Filled 2016-02-11 (×3): qty 4

## 2016-02-11 MED ORDER — OXYCODONE-ACETAMINOPHEN 10-325 MG PO TABS: 1.0000 | ORAL_TABLET | Freq: Four times a day (QID) | ORAL | Status: DC | PRN

## 2016-02-11 MED ORDER — SODIUM CHLORIDE 0.9 % IV SOLN
10.0000 mL/h | Freq: Once | INTRAVENOUS | Status: AC
  Administered 2016-02-11: 10 mL/h via INTRAVENOUS

## 2016-02-11 MED ORDER — NICOTINE 14 MG/24HR TD PT24
14.0000 mg | MEDICATED_PATCH | Freq: Every day | TRANSDERMAL | Status: DC
  Administered 2016-02-11 – 2016-02-13 (×3): 14 mg via TRANSDERMAL
  Filled 2016-02-11 (×3): qty 1

## 2016-02-11 MED ORDER — OXYCODONE HCL 5 MG PO TABS
5.0000 mg | ORAL_TABLET | Freq: Four times a day (QID) | ORAL | Status: DC | PRN
  Administered 2016-02-11 – 2016-02-13 (×5): 5 mg via ORAL
  Filled 2016-02-11 (×5): qty 1

## 2016-02-11 MED ORDER — SODIUM CHLORIDE 0.9 % IV SOLN
80.0000 mg | INTRAVENOUS | Status: DC
  Administered 2016-02-11: 80 mg via INTRAVENOUS
  Filled 2016-02-11 (×2): qty 80

## 2016-02-11 MED ORDER — SODIUM CHLORIDE 0.9 % IV BOLUS (SEPSIS)
1000.0000 mL | Freq: Once | INTRAVENOUS | Status: AC
  Administered 2016-02-11: 1000 mL via INTRAVENOUS

## 2016-02-11 MED ORDER — DOCUSATE SODIUM 100 MG PO CAPS
100.0000 mg | ORAL_CAPSULE | Freq: Two times a day (BID) | ORAL | Status: DC
  Administered 2016-02-11 – 2016-02-13 (×4): 100 mg via ORAL
  Filled 2016-02-11 (×5): qty 1

## 2016-02-11 MED ORDER — ALPRAZOLAM 0.5 MG PO TABS
0.5000 mg | ORAL_TABLET | Freq: Three times a day (TID) | ORAL | Status: DC | PRN
  Administered 2016-02-11 – 2016-02-12 (×2): 0.5 mg via ORAL
  Filled 2016-02-11 (×2): qty 1

## 2016-02-11 MED ORDER — OXYCODONE HCL 5 MG PO TABS
5.0000 mg | ORAL_TABLET | Freq: Once | ORAL | Status: AC
  Administered 2016-02-11: 5 mg via ORAL
  Filled 2016-02-11: qty 1

## 2016-02-11 MED ORDER — PANTOPRAZOLE SODIUM 40 MG IV SOLR
40.0000 mg | Freq: Two times a day (BID) | INTRAVENOUS | Status: DC
  Administered 2016-02-11: 40 mg via INTRAVENOUS
  Filled 2016-02-11 (×2): qty 40

## 2016-02-11 MED ORDER — OXYCODONE-ACETAMINOPHEN 5-325 MG PO TABS
1.0000 | ORAL_TABLET | Freq: Once | ORAL | Status: AC
  Administered 2016-02-11: 1 via ORAL
  Filled 2016-02-11: qty 1

## 2016-02-11 MED ORDER — ALLOPURINOL 100 MG PO TABS
100.0000 mg | ORAL_TABLET | Freq: Every day | ORAL | Status: DC
  Administered 2016-02-12 – 2016-02-13 (×2): 100 mg via ORAL
  Filled 2016-02-11 (×3): qty 1

## 2016-02-11 MED ORDER — OXYCODONE-ACETAMINOPHEN 5-325 MG PO TABS
1.0000 | ORAL_TABLET | Freq: Four times a day (QID) | ORAL | Status: DC | PRN
  Administered 2016-02-11 – 2016-02-13 (×5): 1 via ORAL
  Filled 2016-02-11 (×5): qty 1

## 2016-02-11 MED ORDER — SODIUM CHLORIDE 0.9% FLUSH
3.0000 mL | Freq: Two times a day (BID) | INTRAVENOUS | Status: DC
  Administered 2016-02-11 – 2016-02-13 (×3): 3 mL via INTRAVENOUS

## 2016-02-11 MED ORDER — INSULIN GLARGINE 100 UNIT/ML ~~LOC~~ SOLN
15.0000 [IU] | Freq: Every day | SUBCUTANEOUS | Status: DC
  Administered 2016-02-12 – 2016-02-13 (×2): 15 [IU] via SUBCUTANEOUS
  Filled 2016-02-11 (×3): qty 0.15

## 2016-02-11 MED ORDER — CLONIDINE HCL 0.1 MG PO TABS
0.1000 mg | ORAL_TABLET | Freq: Two times a day (BID) | ORAL | Status: DC
  Administered 2016-02-11 – 2016-02-13 (×4): 0.1 mg via ORAL
  Filled 2016-02-11 (×4): qty 1

## 2016-02-11 MED ORDER — POLYETHYLENE GLYCOL 3350 17 G PO PACK
17.0000 g | PACK | Freq: Every day | ORAL | Status: DC
  Administered 2016-02-12 – 2016-02-13 (×2): 17 g via ORAL
  Filled 2016-02-11 (×2): qty 1
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
## 2016-02-11 NOTE — Progress Notes (Signed)
02/11/2016 1500 Received pt to room 2W11 from ED.  Pt is admitted with symptomatic anemia.  Pt is currently receiving her first unit of blood.  Tele monitor applied and CCMD notified.  Pt is without c/o right now.  Oriented to room, call light and bed.  Call bell in reach.  Family at bedside. Alejandra Hernandez
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
## 2016-02-11 NOTE — ED Notes (Signed)
Has felt good since yesterday, fell last night bumped her head, called ems but did not want to come  when they got there, has chronic back pain states tofay got up to go to rest room and felt weak and had some midsternal cp some nausea no vomting. States took  some glucose gel this am w/o checking her sugar,  CBG per ems was 272, was given 324 asa

## 2016-02-12 ENCOUNTER — Ambulatory Visit (HOSPITAL_COMMUNITY): Payer: Medicare Other

## 2016-02-12 ENCOUNTER — Encounter (HOSPITAL_COMMUNITY): Payer: Self-pay | Admitting: General Practice

## 2016-02-12 ENCOUNTER — Encounter (HOSPITAL_COMMUNITY)
Admission: EM | Disposition: A | Discharge status: Home / Self Care | Payer: Self-pay | Source: Home / Self Care | Attending: Family Medicine

## 2016-02-12 DIAGNOSIS — R079 Chest pain, unspecified: Secondary | ICD-10-CM

## 2016-02-12 HISTORY — PX: ESOPHAGOGASTRODUODENOSCOPY: SHX5428

## 2016-02-12 LAB — CBC
HCT: 21.9 % — ABNORMAL LOW (ref 36.0–46.0)
HEMATOCRIT: 25.8 % — AB (ref 36.0–46.0)
HEMOGLOBIN: 7.1 g/dL — AB (ref 12.0–15.0)
Hemoglobin: 8.6 g/dL — ABNORMAL LOW (ref 12.0–15.0)
MCH: 24.3 pg — ABNORMAL LOW (ref 26.0–34.0)
MCH: 25.9 pg — AB (ref 26.0–34.0)
MCHC: 32.4 g/dL (ref 30.0–36.0)
MCHC: 33.3 g/dL (ref 30.0–36.0)
MCV: 75 fL — ABNORMAL LOW (ref 78.0–100.0)
MCV: 77.7 fL — AB (ref 78.0–100.0)
PLATELETS: 248 10*3/uL (ref 150–400)
Platelets: 231 10*3/uL (ref 150–400)
RBC: 2.92 MIL/uL — AB (ref 3.87–5.11)
RBC: 3.32 MIL/uL — ABNORMAL LOW (ref 3.87–5.11)
RDW: 15.5 % (ref 11.5–15.5)
RDW: 16.6 % — AB (ref 11.5–15.5)
WBC: 12.4 10*3/uL — AB (ref 4.0–10.5)
WBC: 12.3 10*3/uL — ABNORMAL HIGH (ref 4.0–10.5)

## 2016-02-12 LAB — PREPARE RBC (CROSSMATCH)

## 2016-02-12 LAB — TROPONIN I: Troponin I: <0.03 ng/mL (ref <0.031)

## 2016-02-12 LAB — GLUCOSE, CAPILLARY
Glucose-Capillary: 236 mg/dL — ABNORMAL HIGH (ref 65–99)
Glucose-Capillary: 175 mg/dL — ABNORMAL HIGH (ref 65–99)

## 2016-02-12 LAB — BASIC METABOLIC PANEL
ANION GAP: 12 (ref 5–15)
BUN: 45 mg/dL — ABNORMAL HIGH (ref 6–20)
CALCIUM: 8.4 mg/dL — AB (ref 8.9–10.3)
CHLORIDE: 109 mmol/L (ref 101–111)
CO2: 18 mmol/L — ABNORMAL LOW (ref 22–32)
CREATININE: 1.32 mg/dL — AB (ref 0.44–1.00)
GFR calc non Af Amer: 43 mL/min — ABNORMAL LOW (ref >60)
GFR, EST AFRICAN AMERICAN: 50 mL/min — AB (ref >60)
Glucose, Bld: 176 mg/dL — ABNORMAL HIGH (ref 65–99)
Potassium: 4.6 mmol/L (ref 3.5–5.1)
SODIUM: 139 mmol/L (ref 135–145)

## 2016-02-12 LAB — ECHOCARDIOGRAM COMPLETE

## 2016-02-12 SURGERY — EGD (ESOPHAGOGASTRODUODENOSCOPY)
Anesthesia: Moderate Sedation | Laterality: Left

## 2016-02-12 MED ORDER — MIDAZOLAM HCL 5 MG/ML IJ SOLN
INTRAMUSCULAR | Status: AC
  Filled 2016-02-12: qty 2

## 2016-02-12 MED ORDER — FENTANYL CITRATE (PF) 100 MCG/2ML IJ SOLN
INTRAMUSCULAR | Status: AC
  Filled 2016-02-12: qty 2

## 2016-02-12 MED ORDER — DIPHENHYDRAMINE HCL 50 MG/ML IJ SOLN
INTRAMUSCULAR | Status: AC
  Filled 2016-02-12: qty 1

## 2016-02-12 MED ORDER — DIPHENHYDRAMINE HCL 50 MG/ML IJ SOLN
INTRAMUSCULAR | Status: DC | PRN
  Administered 2016-02-12 (×2): 12.5 mg via INTRAVENOUS

## 2016-02-12 MED ORDER — FENTANYL CITRATE (PF) 100 MCG/2ML IJ SOLN
INTRAMUSCULAR | Status: DC | PRN
  Administered 2016-02-12 (×3): 25 ug via INTRAVENOUS

## 2016-02-12 MED ORDER — BUTAMBEN-TETRACAINE-BENZOCAINE 2-2-14 % EX AERO
INHALATION_SPRAY | CUTANEOUS | Status: DC | PRN
  Administered 2016-02-12: 2 via TOPICAL

## 2016-02-12 MED ORDER — SODIUM CHLORIDE 0.9 % IV SOLN
Freq: Once | INTRAVENOUS | Status: AC
  Administered 2016-02-12: 07:00:00 via INTRAVENOUS

## 2016-02-12 MED ORDER — PANTOPRAZOLE SODIUM 40 MG PO TBEC
40.0000 mg | DELAYED_RELEASE_TABLET | Freq: Two times a day (BID) | ORAL | Status: DC
  Administered 2016-02-12 – 2016-02-13 (×2): 40 mg via ORAL
  Filled 2016-02-12 (×2): qty 1

## 2016-02-12 MED ORDER — MIDAZOLAM HCL 10 MG/2ML IJ SOLN
INTRAMUSCULAR | Status: DC | PRN
  Administered 2016-02-12: 1 mg via INTRAVENOUS
  Administered 2016-02-12 (×2): 2 mg via INTRAVENOUS

## 2016-02-12 NOTE — Progress Notes (Signed)
  Echocardiogram 2D Echocardiogram has been performed.  Alejandra Hernandez 02/12/2016, 3:40 PM
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
## 2016-02-12 NOTE — Progress Notes (Signed)
Utilization review completed.  

## 2016-02-12 NOTE — Interval H&P Note (Signed)
History and Physical Interval Note:  02/12/2016 11:52 AM  Alejandra Hernandez  has presented today for surgery, with the diagnosis of melena, anemia  The various methods of treatment have been discussed with the patient and family. After consideration of risks, benefits and other options for treatment, the patient has consented to  Procedure(s): ESOPHAGOGASTRODUODENOSCOPY (EGD) (Left) as a surgical intervention .  The patient's history has been reviewed, patient examined, no change in status, stable for surgery.  I have reviewed the patient's chart and labs.  Questions were answered to the patient's satisfaction.     Kiya Eno M  Assessment:  1.  Anemia. 2.  Melena.  Plan:  1.  Endoscopy. 2.  Risks (bleeding, infection, bowel perforation that could require surgery, sedation-related changes in cardiopulmonary systems), benefits (identification and possible treatment of source of symptoms, exclusion of certain causes of symptoms), and alternatives (watchful waiting, radiographic imaging studies, empiric medical treatment) of upper endoscopy (EGD) were explained to patient/family in detail and patient wishes to proceed.

## 2016-02-12 NOTE — Op Note (Signed)
[facility] Patient Name: Alejandra Hernandez Procedure Date : 02/12/2016 MRN: [id number] Attending MD: Arta Silence , MD Date of Birth: 1956/04/20 CSN: [id number] Age: 60 Admit Type: Inpatient Procedure:                Upper GI endoscopy Indications:              Acute post hemorrhagic anemia, Melena Providers:                Arta Silence, MD, Dortha Schwalbe, RN, Alfonso Patten, Technician Referring MD:              Medicines:                Fentanyl 75 micrograms IV, Midazolam 5 mg IV,                            Diphenhydramine 12.5 mg IV, Cetacaine spray Complications:            No immediate complications. Estimated Blood Loss:     Estimated blood loss: none. Procedure:                Pre-Anesthesia Assessment:                           - Prior to the procedure, a History and Physical                            was performed, and patient medications and                            allergies were reviewed. The patient's tolerance of                            previous anesthesia was also reviewed. The risks                            and benefits of the procedure and the sedation                            options and risks were discussed with the patient.                            All questions were answered, and informed consent                            was obtained. Prior Anticoagulants: The patient has                            taken Plavix (clopidogrel), last dose was 3 days                            prior to procedure. ASA Grade Assessment: III - A  patient with severe systemic disease. After                            reviewing the risks and benefits, the patient was                            deemed in satisfactory condition to undergo the                            procedure.                           After obtaining informed consent, the endoscope was                            passed under direct vision.  Throughout the                            procedure, the patient's blood pressure, pulse, and                            oxygen saturations were monitored continuously. The                            [id number]) scope was introduced through the                            mouth, and advanced to the second part of duodenum.                            The upper GI endoscopy was accomplished without                            difficulty. The patient tolerated the procedure                            well. Scope In: Scope Out: Findings:      A small hiatal hernia was present.      The exam of the esophagus was otherwise normal.      The entire examined stomach was normal.      One non-bleeding cratered duodenal ulcer with no stigmata of bleeding       was found in the duodenal bulb. The lesion was 8 mm in largest dimension.      The exam of the duodenum was otherwise normal. Impression:               - Small hiatal hernia.                           - Normal stomach.                           - One non-bleeding duodenal ulcer with no stigmata                            of  bleeding. Highly likely source of patient's                            melena (which has subsequently resolved) and acute                            blood loss anemia. Etiology likely NSAIDs                            (naproxen) and antiplatelet therapy (clopidigrel).                            Based on endoscopy, the ulcer is at low risk for                            rebleeding.                           - The examination was otherwise normal. Moderate Sedation:      Moderate (conscious) sedation was administered by the endoscopy nurse       and supervised by the endoscopist. The following parameters were       monitored: oxygen saturation, heart rate, blood pressure, and response       to care. Recommendation:           - Return patient to hospital ward for ongoing care.                           - Mechanical  soft diet today.                           - Perform an H. pylori serology today.                           - Use Protonix (pantoprazole) 40 mg PO BID for 8                            weeks, and then 40 mg po qd indefinitely so long as                            patient stays on clopidigrel therapy.                           - No Plavix for 7 days.                           - No NSAIDs (ibuprofen, Motrin, naproxen, Advil,                            Excedrin, BC powders, Goody powders) indefinitely.                            Baby ASA ok now, if antiplatelet therapy is felt  absolutely required within the next one week.                           [facility] will follow. Procedure Code(s):        --- Professional ---                           [phone], Esophagogastroduodenoscopy, flexible,                            transoral; diagnostic, including collection of                            specimen(s) by brushing or washing, when performed                            (separate procedure) Diagnosis Code(s):        --- Professional ---                           K44.9, Diaphragmatic hernia without obstruction or                            gangrene                           K26.9, Duodenal ulcer, unspecified as acute or                            chronic, without hemorrhage or perforation                           D62, Acute posthemorrhagic anemia                           K92.1, Melena (includes Hematochezia) CPT copyright 2016 [employer]. All rights reserved. The codes documented in this report are preliminary and upon coder review may  be revised to meet current compliance requirements. Arta Silence, MD Arta Silence, MD 02/12/2016 12:23:43 PM This report has been signed electronically. Number of Addenda: 0

## 2016-02-12 NOTE — Care Management Note (Signed)
Case Management Note Marvetta Gibbons RN, BSN Unit 2W-Case Manager [phone]  Patient Details  Name: Alejandra Hernandez MRN: [id number] Date of Birth: September 20, 1956  Subjective/Objective:   Pt admitted with GIB, symptomatic anemia                 Action/Plan: PTA pt lived at home with son, anticipate return home- CM to follow  Expected Discharge Date:                  Expected Discharge Plan:  Home/Self Care  In-House Referral:     Discharge planning Services  CM Consult  Post Acute Care Choice:    Choice offered to:     DME Arranged:    DME Agency:     HH Arranged:    [place] Agency:     Status of Service:  In process, will continue to follow  Medicare Important Message Given:    Date Medicare IM Given:    Medicare IM give by:    Date Additional Medicare IM Given:    Additional Medicare Important Message give by:     If discussed at [place] of Stay Meetings, dates discussed:    Additional Comments:  Dawayne Patricia, RN 02/12/2016, 3:42 PM
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
## 2016-02-13 DIAGNOSIS — I5032 Chronic diastolic (congestive) heart failure: Secondary | ICD-10-CM

## 2016-02-13 LAB — TYPE AND SCREEN
ABO/RH(D): A NEG
Antibody Screen: NEGATIVE
Unit division: 0
Unit division: 0
Unit division: 0

## 2016-02-13 LAB — BASIC METABOLIC PANEL
ANION GAP: 3 — AB (ref 5–15)
BUN: 28 mg/dL — AB (ref 6–20)
CHLORIDE: 111 mmol/L (ref 101–111)
CO2: 20 mmol/L — ABNORMAL LOW (ref 22–32)
Calcium: 8.3 mg/dL — ABNORMAL LOW (ref 8.9–10.3)
Creatinine, Ser: 1.21 mg/dL — ABNORMAL HIGH (ref 0.44–1.00)
GFR calc Af Amer: 56 mL/min — ABNORMAL LOW (ref >60)
GFR, EST NON AFRICAN AMERICAN: 48 mL/min — AB (ref >60)
Glucose, Bld: 177 mg/dL — ABNORMAL HIGH (ref 65–99)
POTASSIUM: 4.6 mmol/L (ref 3.5–5.1)
SODIUM: 134 mmol/L — AB (ref 135–145)

## 2016-02-13 LAB — HEMOGLOBIN A1C
Hgb A1c MFr Bld: 7.2 % — ABNORMAL HIGH (ref 4.8–5.6)
MEAN PLASMA GLUCOSE: 160 mg/dL

## 2016-02-13 LAB — CBC
HCT: 24.5 % — ABNORMAL LOW (ref 36.0–46.0)
HEMOGLOBIN: 8 g/dL — AB (ref 12.0–15.0)
MCH: 25.6 pg — AB (ref 26.0–34.0)
MCHC: 32.7 g/dL (ref 30.0–36.0)
MCV: 78.5 fL (ref 78.0–100.0)
PLATELETS: 226 10*3/uL (ref 150–400)
RBC: 3.12 MIL/uL — AB (ref 3.87–5.11)
RDW: 17 % — ABNORMAL HIGH (ref 11.5–15.5)
WBC: 11.1 10*3/uL — AB (ref 4.0–10.5)

## 2016-02-13 LAB — H. PYLORI ANTIBODY, IGG: H Pylori IgG: 2.6 U/mL — ABNORMAL HIGH (ref 0.0–0.8)

## 2016-02-13 LAB — GLUCOSE, CAPILLARY
GLUCOSE-CAPILLARY: 116 mg/dL — AB (ref 65–99)
Glucose-Capillary: 215 mg/dL — ABNORMAL HIGH (ref 65–99)

## 2016-02-13 MED ORDER — AMOXICILLIN 500 MG PO CAPS: 1000.0000 mg | ORAL_CAPSULE | Freq: Two times a day (BID) | ORAL | Status: DC

## 2016-02-13 MED ORDER — PANTOPRAZOLE SODIUM 40 MG PO TBEC: 40.0000 mg | DELAYED_RELEASE_TABLET | Freq: Two times a day (BID) | ORAL | Status: DC

## 2016-02-13 MED ORDER — NICOTINE 14 MG/24HR TD PT24: 14.0000 mg | MEDICATED_PATCH | Freq: Every day | TRANSDERMAL | Status: DC

## 2016-02-13 MED ORDER — CLARITHROMYCIN 500 MG PO TABS: 500.0000 mg | ORAL_TABLET | Freq: Two times a day (BID) | ORAL | Status: DC

## 2016-02-13 MED ORDER — CLARITHROMYCIN 500 MG PO TABS
500.0000 mg | ORAL_TABLET | Freq: Two times a day (BID) | ORAL | Status: DC
  Administered 2016-02-13: 500 mg via ORAL
  Filled 2016-02-13: qty 1

## 2016-02-13 MED ORDER — AMOXICILLIN 500 MG PO CAPS
1000.0000 mg | ORAL_CAPSULE | Freq: Two times a day (BID) | ORAL | Status: DC
  Administered 2016-02-13: 1000 mg via ORAL
  Filled 2016-02-13: qty 2

## 2016-02-13 NOTE — Care Management Note (Signed)
Case Management Note Marvetta Gibbons RN, BSN Unit 2W-Case Manager [phone]  Patient Details  Name: Alejandra Hernandez MRN: [id number] Date of Birth: Apr 03, 1956  Subjective/Objective:   Pt admitted with GIB, symptomatic anemia                 Action/Plan: PTA pt lived at home with son, anticipate return home- CM to follow  Expected Discharge Date:   02/13/16               Expected Discharge Plan:  Home/Self Care  In-House Referral:     Discharge planning Services  CM Consult  Post Acute Care Choice:    Choice offered to:     DME Arranged:    DME Agency:     HH Arranged:    [place] Agency:     Status of Service:  Completed, signed off  Medicare Important Message Given:    Date Medicare IM Given:    Medicare IM give by:    Date Additional Medicare IM Given:    Additional Medicare Important Message give by:     If discussed at [place] of Stay Meetings, dates discussed:    Discharge Disposition: home/self care   Additional Comments:  Dawayne Patricia, RN 02/13/2016, 1:22 PM

## 2016-02-13 NOTE — Progress Notes (Signed)
Discharge instructions given to patient, discussed med list, questions answered.  IV site discontinued at this time  and  The Telemetry box was  discontinued earlier  By patient when physician in room and dressing to go home.  Mervyn Skeeters, RN

## 2016-02-13 NOTE — Discharge Instructions (Signed)
You were hospitalized for a GI bleed and tested positive for H. Pylori, a common bacteria that can live in your GI system and eventually cause bleeding/ulcers. You will need to take the two antibiotics for through 4/14 to complete a 14 day course. Please continue to take the Protonix 40 mg twice a day.   Do not re-start your Plavix until your follow up appointment next week due to risk of bleeding.   Do not re-start your Lisinopril until your follow up appointment. You came into the hospital with a kidney injury, which was improving well at time of discharge. At your follow up a doctor will recheck your kidney function and resume the Lisinopril if its back to normal. Please drink plenty of fluids because this can help improve your kidney function as well.   Don't take your Colchicine while taking the antibiotics prescribed as they can have interactions. Please let us know if you have a gout flare.   If you start having lots of dark, tarry stools, bright red blood, or have shortness of breath/dizziness/chest pain you need to be seen sooner because these could be signs that your blood counts are low.
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
## 2016-02-13 NOTE — Progress Notes (Signed)
IV site converted to [place]. RN
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
## 2016-02-13 NOTE — Progress Notes (Signed)
Subjective: One episode melena yesterday, first bowel movement since admission. No abdominal pain. No hematemesis.  Objective: Vital signs in last 24 hours: Temp:  [97.7 F (36.5 C)-98.3 F (36.8 C)] 98.3 F (36.8 C) (03/22 0620) Pulse Rate:  [79-114] 79 (03/22 0620) Resp:  [10-21] 18 (03/22 0620) BP: (109-193)/(37-67) 144/66 mmHg (03/22 0620) SpO2:  [94 %-100 %] 100 % (03/22 0620) Weight change:  Last BM Date: 02/12/16  PE: GEN:  NAD ABD:  Soft, non-tender  Lab Results: CBC    Component Value Date/Time   WBC 11.1* 02/13/2016 0340   RBC 3.12* 02/13/2016 0340   HGB 8.0* 02/13/2016 0340   HCT 24.5* 02/13/2016 0340   PLT 226 02/13/2016 0340   MCV 78.5 02/13/2016 0340   MCH 25.6* 02/13/2016 0340   MCHC 32.7 02/13/2016 0340   RDW 17.0* 02/13/2016 0340   LYMPHSABS 4.3* 02/11/2016 0940   MONOABS 0.9 02/11/2016 0940   EOSABS 0.1 02/11/2016 0940   BASOSABS 0.0 02/11/2016 0940   CMP     Component Value Date/Time   NA 134* 02/13/2016 0340   K 4.6 02/13/2016 0340   CL 111 02/13/2016 0340   CO2 20* 02/13/2016 0340   GLUCOSE 177* 02/13/2016 0340   BUN 28* 02/13/2016 0340   CREATININE 1.21* 02/13/2016 0340   CALCIUM 8.3* 02/13/2016 0340   PROT 6.1* 02/11/2016 0940   ALBUMIN 3.3* 02/11/2016 0940   AST 21 02/11/2016 0940   ALT 16 02/11/2016 0940   ALKPHOS 86 02/11/2016 0940   BILITOT 0.2* 02/11/2016 0940   GFRNONAA 48* 02/13/2016 0340   GFRAA 56* 02/13/2016 0340   Assessment:  1.  Melena, resolved.  Yesterday's passage likely old blood. 2.  Acute blood loss anemia.  Slight interval decrease today cf. Wilburn Mylar is likely reflective of equilibrative changes. 3.  Duodenal ulcer, likely NSAID-mediated, cause of #1 and #2 above.  Plan:  1.  Protonix 40 mg po bid, now and upon discharge. 2.  Advance diet to regular. 3.  No NSAIDs indefinitely (baby ASA is ok). 4.  Hold clopidigrel for today of 7 days. 5.  Will need to be on PPI indefinitely so long as patient has to  resume clopidigrel into the future. 6.  Patient ok for hospital discharge from GI perspective, will need CBC next week with PCP, and we'll arrange follow-up with [facility] in a few weeks.  We typically don't repeat endoscopy for duodenal ulcers, but she will need elective screening colonoscopy at some point. 7.  Will sign-off; please call with questions; thank you for the consult.   Alejandra Hernandez 02/13/2016, 8:40 AM   Pager [phone] If no answer or after 5 PM call [phone]

## 2016-02-19 ENCOUNTER — Telehealth: Payer: Self-pay

## 2016-02-19 ENCOUNTER — Ambulatory Visit (INDEPENDENT_AMBULATORY_CARE_PROVIDER_SITE_OTHER): Payer: Medicare Other | Admitting: Internal Medicine

## 2016-02-19 ENCOUNTER — Encounter: Payer: Self-pay | Admitting: Internal Medicine

## 2016-02-19 VITALS — BP 141/54 | HR 84 | Temp 98.2°F | Ht 63.0 in | Wt 140.0 lb

## 2016-02-19 DIAGNOSIS — N179 Acute kidney failure, unspecified: Secondary | ICD-10-CM

## 2016-02-19 DIAGNOSIS — K264 Chronic or unspecified duodenal ulcer with hemorrhage: Secondary | ICD-10-CM | POA: Diagnosis not present

## 2016-02-19 DIAGNOSIS — D649 Anemia, unspecified: Secondary | ICD-10-CM | POA: Diagnosis not present

## 2016-02-19 LAB — CBC WITH DIFFERENTIAL/PLATELET
BASOS ABS: 0.1 10*3/uL (ref 0.0–0.1)
Basophils Relative: 1 % (ref 0–1)
EOS ABS: 0.3 10*3/uL (ref 0.0–0.7)
EOS PCT: 3 % (ref 0–5)
HCT: 30.1 % — ABNORMAL LOW (ref 36.0–46.0)
Hemoglobin: 9.5 g/dL — ABNORMAL LOW (ref 12.0–15.0)
Lymphocytes Relative: 29 % (ref 12–46)
Lymphs Abs: 2.7 10*3/uL (ref 0.7–4.0)
MCH: 25.5 pg — ABNORMAL LOW (ref 26.0–34.0)
MCHC: 31.6 g/dL (ref 30.0–36.0)
MCV: 80.9 fL (ref 78.0–100.0)
MPV: 9.8 fL (ref 8.6–12.4)
Monocytes Absolute: 0.6 10*3/uL (ref 0.1–1.0)
Monocytes Relative: 7 % (ref 3–12)
Neutro Abs: 5.5 10*3/uL (ref 1.7–7.7)
Neutrophils Relative %: 60 % (ref 43–77)
PLATELETS: 407 10*3/uL — AB (ref 150–400)
RBC: 3.72 MIL/uL — AB (ref 3.87–5.11)
RDW: 18.2 % — AB (ref 11.5–15.5)
WBC: 9.2 10*3/uL (ref 4.0–10.5)

## 2016-02-19 NOTE — Telephone Encounter (Signed)
Dr. Brett Albino called to get Dr. Theodosia Blender medication recommendations.  In the past, patient was taking ASA and Plavix for treatment of carotid artery stenosis. She was recently seen in the hospital for a GI bleed and Plavix was stopped.  Dr. Brett Albino would like to know from Dr. Radford Pax if ASA is sufficient for treatment or if Plavix should be restarted. Dr. Velia Meyer OV was today and is in EPIC.   To. Dr. Radford Pax.

## 2016-02-19 NOTE — Assessment & Plan Note (Addendum)
Cr bumped to 1.35 during hospitalization. Cr improved to 1.21 on discharge. Baseline Cr appears to be 0.8. - Will recheck BMET today - Encouraged fluid intake - Follow-up for new patient appointment.

## 2016-02-19 NOTE — Patient Instructions (Signed)
It was so nice to meet you!  I have ordered some labs today to check your kidneys and your hemoglobin level. I will send you these results in the mail.   Please continue to take the Amoxicillin, Clarithromycin, and Protonix.  I will call your Cardiologist's office and let you know if you should restart the Plavix or continue to not take it.  -Dr. Brett Albino

## 2016-02-19 NOTE — Assessment & Plan Note (Signed)
Improved. Pt notes that her dizziness and fatigue have improved. She has not had any melena or hematochezia. - Will recheck CBC today to make sure it has not dropped since discharge from the hospital - Continue Amoxicillin, Clarithomycin, and Protonix - Continue to avoid NSAIDs - Will call Cardiologist's office to determine if Pt will need to be continued on Plavix. Advised Pt to continue to hold Plavix at this time. - Plan for Pt to receive colonoscopy through [facility] - Follow-up for new patient appointment

## 2016-02-19 NOTE — Progress Notes (Signed)
[facility] Phone: [phone]  Subjective:  Pt is here for a hospital follow-up visit. She was hospitalized for melena and symptomatic anemia with a Hgb of 6.2. She received 3 units of pRBCs. EGD was performed and showed a non-bleeding duodenal ulcer, thought to be secondary to Naproxen and Plavix use. H. Pylori test was found to be positive. Since leaving the hospital, she has been mildly dizzy and fatigued. The dizziness has improved since being in the hospital. Her stools have been brown and soft. No hematochezia or melena. She is taking the Amoxicillin and Clarithromycin. She is taking the Protonix twice a day. She has not taken any Naproxen or Ibuprofen. She has not taken any Plavix. She is supposed to undergo colonoscopy at the GI doctor and she is waiting for their office to call her to schedule this procedure. No shortness of breath or palpitations.  ROS: See HPI for pertinent positives and negatives Past Medical History- significant for HTN, carotid artery stenosis, chronic diastolic heart failure, type 2 DM, duodenal ulcer causing symptomatic anemia. Reviewed problem list.  Medications- reviewed and updated Current Outpatient Prescriptions  Medication Sig Dispense Refill  . allopurinol (ZYLOPRIM) 100 MG tablet Take 100 mg by mouth daily.      Marland Kitchen ALPRAZolam (XANAX) 0.5 MG tablet Take 0.5 mg by mouth 3 (three) times daily as needed for anxiety.     Marland Kitchen amLODipine (NORVASC) 10 MG tablet Take 10 mg by mouth daily.     Marland Kitchen amoxicillin (AMOXIL) 500 MG capsule Take 2 capsules (1,000 mg total) by mouth every 12 (twelve) hours. 27 capsule 0  . aspirin 81 MG chewable tablet Chew 1 tablet (81 mg total) by mouth daily. 30 tablet 0  . clarithromycin (BIAXIN) 500 MG tablet Take 1 tablet (500 mg total) by mouth every 12 (twelve) hours. 27 tablet 0  . cloNIDine (CATAPRES) 0.1 MG tablet Take 0.1 mg by mouth 2 (two) times daily.   1  . glimepiride (AMARYL) 2 MG tablet Take 2 mg by  mouth as needed.     Marland Kitchen HYDROcodone-acetaminophen (NORCO/VICODIN) 5-325 MG per tablet Take 1 tablet by mouth every 8 (eight) hours as needed for moderate pain.    Marland Kitchen insulin glargine (LANTUS) 100 UNIT/ML injection Inject 26 Units into the skin 3 (three) times daily. Pt does twice daily every day. If needed pt can use 1 more time    . metFORMIN (GLUCOPHAGE) 1000 MG tablet Take 1,000 mg by mouth daily with breakfast.     . nicotine (NICODERM CQ - DOSED IN MG/24 HOURS) 14 mg/24hr patch Place 1 patch (14 mg total) onto the skin daily. 28 patch 0  . nitroGLYCERIN (NITROSTAT) 0.4 MG SL tablet Place 1 tablet (0.4 mg total) under the tongue every 5 (five) minutes as needed for chest pain. 30 tablet 0  . oxyCODONE-acetaminophen (PERCOCET) 10-325 MG tablet Take 1 tablet by mouth every 4 (four) hours as needed for pain.    . pantoprazole (PROTONIX) 40 MG tablet Take 1 tablet (40 mg total) by mouth 2 (two) times daily before a meal. 60 tablet 1  . rosuvastatin (CRESTOR) 40 MG tablet Take 1 tablet (40 mg total) by mouth daily. 90 tablet 3   No current facility-administered medications for this visit.   Chief complaint-noted Family history reviewed for today's visit. No changes. Social history- patient is a current smoker  Objective: BP 141/54 mmHg  Pulse 84  Temp(Src) 98.2 F (36.8 C) (Oral)  Ht 5\' 3"  (  1.6 m)  Wt 140 lb (63.504 kg)  BMI 24.81 kg/m2 Gen: NAD, alert, cooperative with exam, walks with a cane HEENT: NCAT, EOMI, MMM Neck: FROM, supple CV: RRR, no murmur Resp: CTABL, no wheezes, normal work of breathing GI: SNTND, BS present, no guarding or organomegaly Msk: Moves UE/LE spontaneously Neuro: Alert and oriented, no gross deficits Skin: No rashes, no lesions Psych: Appropriate behavior  Assessment/Plan: Hospital Follow-up for Symptomatic Anemia: Improved. Pt notes that her dizziness and fatigue have improved. She has not had any melena or hematochezia. - Will recheck CBC today to make  sure it has not dropped since discharge from the hospital - Continue Amoxicillin, Clarithomycin, and Protonix - Continue to avoid NSAIDs - Will call Cardiologist's office to determine if Pt will need to be continued on Plavix. Advised Pt to continue to hold Plavix at this time. - Plan for Pt to receive colonoscopy through [facility] - Follow-up for new patient appointment  AKI: Cr bumped to 1.35 during hospitalization. Cr improved to 1.21 on discharge. Baseline Cr appears to be 0.8. - Will recheck BMET today - Encouraged fluid intake - Follow-up for new patient appointment.   Hyman Bible, MD PGY-1

## 2016-02-19 NOTE — Telephone Encounter (Signed)
Who put patient on plavix initially.  I do not usually put patient's on Plavix for carotid stenosis

## 2016-02-19 NOTE — Telephone Encounter (Signed)
This needs to be forwarded to PCP - I did not put patient on Plavix

## 2016-02-19 NOTE — Telephone Encounter (Signed)
The first time Plavix is entered in EPIC was ED admission 09/14/14.

## 2016-02-20 ENCOUNTER — Telehealth: Payer: Self-pay | Admitting: Internal Medicine

## 2016-02-20 ENCOUNTER — Encounter: Payer: Self-pay | Admitting: Internal Medicine

## 2016-02-20 LAB — BASIC METABOLIC PANEL WITH GFR
BUN: 16 mg/dL (ref 7–25)
CALCIUM: 9.1 mg/dL (ref 8.6–10.4)
CO2: 17 mmol/L — ABNORMAL LOW (ref 20–31)
CREATININE: 1.22 mg/dL — AB (ref 0.50–1.05)
Chloride: 104 mmol/L (ref 98–110)
GFR, EST AFRICAN AMERICAN: 56 mL/min — AB (ref >=60)
GFR, Est Non African American: 49 mL/min — ABNORMAL LOW (ref >=60)
Glucose, Bld: 180 mg/dL — ABNORMAL HIGH (ref 65–99)
Potassium: 4.8 mmol/L (ref 3.5–5.3)
SODIUM: 134 mmol/L — AB (ref 135–146)

## 2016-02-20 NOTE — Telephone Encounter (Signed)
Forwarded to Dr. Brett Albino and Dr. Sheryle Hail.

## 2016-02-20 NOTE — Telephone Encounter (Signed)
Pt wants to talk to dr Brett Albino about taking her lisonpril.   When talking to dr Brett Albino today, dr didn't mention what to do about the lisonpril.  Please advise

## 2016-02-20 NOTE — Telephone Encounter (Signed)
Spoke with Patient's Cardiologist, Dr. Radford Pax, who also does not know why Alejandra Hernandez is taking Plavix. Will discontinue Plavix at this time, given her recent hospitalization for GI bleeding. Pt should continue taking Aspirin. I have called Ms. Rosana Hoes and informed her of this plan.

## 2016-02-20 NOTE — Telephone Encounter (Signed)
Will forward to Dr. Mayo. Jazmin Hartsell,CMA  

## 2016-02-29 ENCOUNTER — Other Ambulatory Visit: Payer: Self-pay | Admitting: Cardiology

## 2016-02-29 ENCOUNTER — Other Ambulatory Visit: Payer: Self-pay | Admitting: *Deleted

## 2016-02-29 DIAGNOSIS — E7800 Pure hypercholesterolemia, unspecified: Secondary | ICD-10-CM

## 2016-02-29 MED ORDER — ROSUVASTATIN CALCIUM 40 MG PO TABS: 40.0000 mg | ORAL_TABLET | Freq: Every day | ORAL | Status: DC

## 2016-03-11 ENCOUNTER — Other Ambulatory Visit: Payer: Self-pay | Admitting: Internal Medicine

## 2016-04-01 ENCOUNTER — Other Ambulatory Visit: Payer: Self-pay | Admitting: Cardiology

## 2016-04-15 ENCOUNTER — Other Ambulatory Visit: Payer: Self-pay | Admitting: Internal Medicine

## 2016-04-19 ENCOUNTER — Observation Stay (HOSPITAL_COMMUNITY)
Admission: EM | Admit: 2016-04-19 | Discharge: 2016-04-20 | Disposition: A | Discharge status: Home / Self Care | Payer: Medicare Other | Attending: Family Medicine | Admitting: Family Medicine

## 2016-04-19 ENCOUNTER — Emergency Department (HOSPITAL_COMMUNITY): Payer: Medicare Other

## 2016-04-19 ENCOUNTER — Encounter (HOSPITAL_COMMUNITY): Payer: Self-pay

## 2016-04-19 DIAGNOSIS — Z7902 Long term (current) use of antithrombotics/antiplatelets: Secondary | ICD-10-CM | POA: Insufficient documentation

## 2016-04-19 DIAGNOSIS — Z794 Long term (current) use of insulin: Secondary | ICD-10-CM | POA: Diagnosis not present

## 2016-04-19 DIAGNOSIS — K219 Gastro-esophageal reflux disease without esophagitis: Secondary | ICD-10-CM | POA: Insufficient documentation

## 2016-04-19 DIAGNOSIS — M549 Dorsalgia, unspecified: Secondary | ICD-10-CM | POA: Insufficient documentation

## 2016-04-19 DIAGNOSIS — I5032 Chronic diastolic (congestive) heart failure: Secondary | ICD-10-CM | POA: Diagnosis not present

## 2016-04-19 DIAGNOSIS — D509 Iron deficiency anemia, unspecified: Secondary | ICD-10-CM | POA: Diagnosis not present

## 2016-04-19 DIAGNOSIS — R1310 Dysphagia, unspecified: Secondary | ICD-10-CM | POA: Diagnosis not present

## 2016-04-19 DIAGNOSIS — E119 Type 2 diabetes mellitus without complications: Secondary | ICD-10-CM | POA: Diagnosis not present

## 2016-04-19 DIAGNOSIS — R079 Chest pain, unspecified: Secondary | ICD-10-CM | POA: Insufficient documentation

## 2016-04-19 DIAGNOSIS — K269 Duodenal ulcer, unspecified as acute or chronic, without hemorrhage or perforation: Secondary | ICD-10-CM | POA: Insufficient documentation

## 2016-04-19 DIAGNOSIS — M109 Gout, unspecified: Secondary | ICD-10-CM | POA: Diagnosis not present

## 2016-04-19 DIAGNOSIS — E871 Hypo-osmolality and hyponatremia: Secondary | ICD-10-CM | POA: Diagnosis not present

## 2016-04-19 DIAGNOSIS — I110 Hypertensive heart disease with heart failure: Secondary | ICD-10-CM | POA: Diagnosis not present

## 2016-04-19 DIAGNOSIS — G8929 Other chronic pain: Secondary | ICD-10-CM | POA: Diagnosis not present

## 2016-04-19 DIAGNOSIS — E785 Hyperlipidemia, unspecified: Secondary | ICD-10-CM | POA: Diagnosis not present

## 2016-04-19 DIAGNOSIS — F17210 Nicotine dependence, cigarettes, uncomplicated: Secondary | ICD-10-CM | POA: Diagnosis not present

## 2016-04-19 DIAGNOSIS — Z7982 Long term (current) use of aspirin: Secondary | ICD-10-CM | POA: Diagnosis not present

## 2016-04-19 DIAGNOSIS — I6529 Occlusion and stenosis of unspecified carotid artery: Secondary | ICD-10-CM | POA: Insufficient documentation

## 2016-04-19 DIAGNOSIS — R0789 Other chest pain: Secondary | ICD-10-CM | POA: Diagnosis not present

## 2016-04-19 LAB — COMPREHENSIVE METABOLIC PANEL
ALT: 28 U/L (ref 14–54)
AST: 25 U/L (ref 15–41)
Albumin: 3.6 g/dL (ref 3.5–5.0)
Alkaline Phosphatase: 122 U/L (ref 38–126)
Anion gap: 8 (ref 5–15)
BUN: 14 mg/dL (ref 6–20)
CHLORIDE: 94 mmol/L — AB (ref 101–111)
CO2: 22 mmol/L (ref 22–32)
Calcium: 9.2 mg/dL (ref 8.9–10.3)
Creatinine, Ser: 1.11 mg/dL — ABNORMAL HIGH (ref 0.44–1.00)
GFR, EST NON AFRICAN AMERICAN: 53 mL/min — AB (ref >60)
Glucose, Bld: 263 mg/dL — ABNORMAL HIGH (ref 65–99)
POTASSIUM: 4.2 mmol/L (ref 3.5–5.1)
Sodium: 124 mmol/L — ABNORMAL LOW (ref 135–145)
TOTAL PROTEIN: 7.1 g/dL (ref 6.5–8.1)
Total Bilirubin: 0.2 mg/dL — ABNORMAL LOW (ref 0.3–1.2)

## 2016-04-19 LAB — CBC
HCT: 33.2 % — ABNORMAL LOW (ref 36.0–46.0)
Hemoglobin: 10.3 g/dL — ABNORMAL LOW (ref 12.0–15.0)
MCH: 22.4 pg — ABNORMAL LOW (ref 26.0–34.0)
MCHC: 31 g/dL (ref 30.0–36.0)
MCV: 72.3 fL — ABNORMAL LOW (ref 78.0–100.0)
PLATELETS: 334 10*3/uL (ref 150–400)
RBC: 4.59 MIL/uL (ref 3.87–5.11)
RDW: 14.9 % (ref 11.5–15.5)
WBC: 9 10*3/uL (ref 4.0–10.5)

## 2016-04-19 LAB — I-STAT TROPONIN, ED: TROPONIN I, POC: 0.02 ng/mL (ref 0.00–0.08)

## 2016-04-19 LAB — TROPONIN I: TROPONIN I: 0.03 ng/mL (ref <0.031)

## 2016-04-19 MED ORDER — SODIUM CHLORIDE 0.9 % IV BOLUS (SEPSIS)
1000.0000 mL | Freq: Once | INTRAVENOUS | Status: AC
  Administered 2016-04-19: 1000 mL via INTRAVENOUS

## 2016-04-19 MED ORDER — CLONIDINE HCL 0.1 MG PO TABS
0.1000 mg | ORAL_TABLET | Freq: Two times a day (BID) | ORAL | Status: DC
  Administered 2016-04-19: 0.1 mg via ORAL
  Filled 2016-04-19: qty 1

## 2016-04-19 MED ORDER — GI COCKTAIL ~~LOC~~
30.0000 mL | Freq: Once | ORAL | Status: AC
Start: 2016-04-19 — End: 2016-04-19
  Administered 2016-04-19: 30 mL via ORAL
  Filled 2016-04-19: qty 30

## 2016-04-19 MED ORDER — AMLODIPINE BESYLATE 10 MG PO TABS
10.0000 mg | ORAL_TABLET | Freq: Every day | ORAL | Status: DC
  Administered 2016-04-19 – 2016-04-20 (×2): 10 mg via ORAL
  Filled 2016-04-19: qty 1
  Filled 2016-04-19: qty 2

## 2016-04-19 NOTE — H&P (Signed)
[facility] Admission History and Physical Service Pager: [phone]  Patient name: Alejandra Hernandez Medical record number: [id number] Date of birth: September 11, 1956 Age: 60 y.o. Gender: female  Primary Care Provider: Antonietta Jewel, MD Consultants: None Code Status: Full  Chief Complaint: Chest pain  Assessment and Plan: Alejandra Hernandez is a 60 y.o. female presenting with Chest Pain and Accelerated HTN . PMH is significant for HTN, Gout, HLD, Carotid Stenosis, DMII, GERD, Anemia, GI bleed, Diastolic Cardiac Dysfunction, Chronic Back Pain.   # Chest Pain  - Typical and atypical symptoms. HEART score 4, Currently smoking, EKG without acute changes, troponin negative initially, CXR unremarkable. Hx of angina relieved by nitro in the past. DDX includes esophageal spasm / GERD / in addtion to CAD.  - Admit to tele obs Dr. McDiarmid - Troponin trend overnight.  - EKG in the am - Recent Echo already performed.  - check lipid, TSH - A1C 7.2 in March.  - ASA give - nitro prn - morphine prn pain - Recent upper endoscopy with small hiatal hernia.    - Continue on Crestor  # Hyponatremia - Pt. Reports drinking significant amounts of fluid and also urinating very frequently. Na 124 corrects to 128 with hyperglycemia. Total protein is normal. She does complain of cramps as well. Unclear if polydypsia, less likely hypovolemic hyponatremia. Workup for SIADH etc. As below  - Check Serum osm - urine osm, Na, Cr - hold free water at this time. Chips only.  - Check Mg, Phos - TSH, lipids.   # Accelerated HTN - She presented with accelerated htn. Given repeat doses of her home medications. BP back up to the 170's upon my entrance to the patient's room. She reports compliance at home with her medicines. Cr better than previous. She does have chest pain. HTN seems to be related to the patient's anxiety as well.  - Labetalol prn - Restarted home Clonidine, Norvasc 10mg , Lisinopril  20mg   # GERD / Dysphagia - pt. With recent upper endoscopy revealing duodenal ulceration. Was treated with protonix 40BID with plan to go to daily protonix after 8 weeks. She is to strictly stay off of NSAID's. Additionally, she tells me this hospitalization that she has had dysphagia with "food getting stuck" feeling around the middle of her chest. Raises again the possibility of lower esophageal dysmotility / GERD dysfunction despite therapy. Has been on  protonix 8 weeks now.  - Consider ambulatory ph - She is supposed to f/u with GI as an outpatient. Need to ensure that this takes place.  - Will continue BID ppi for now.   # DMII - A1C 7.2 at last check 01/2016.  - 26 units of lantus daily per pt. She is not taking this TID.  - Novolog SSI  # HLD - continue crestor  # Gout - continue allopurinol  # Anemia - Iron deficiency anemia. hgb 10.3, MCV 72.3, she had a GI bleed in march that was thought to be contributing. Treating duodenal ulceration with protonix. She is on iron supplementation which she says she has been taking.  - Continue PO iron.   # Chronic Back pain - continue oxycodone here.   # Carotid Stenosis - she had been on plavix and ASA before for this, however plavix was held at her last hospitalization with plan to restart after GI bleeding had resolved.  - Consider restarting this hospitalization.   FEN/GI: NPO except for chips Prophylaxis: Lovenox  Disposition: pending further workup.  History of Present Illness:  Alejandra Hernandez is a 60 y.o. female presenting with chest pain. Chest pain started around 5 pm. She was sitting down in her chair and she suddenly developed chest pain, she got very anxious, she lightheaded, she took two aspirins, and she sat down to try to relax, and she continued to have chest pain. This pain continued for about 30 minutes, and then she called 911. She took nitroglycerin, and this did not help her pain. Chest pain had subsided and is now  returning. She says she feels that her anxiety makes the pain worse. Chest pain is located in left lower chest, and she initially also felt it in her shoulder but this is no longer there. No neck pain, jaw pain, or arm pain, no diaphoresis, she had shortness of breath with these symptoms. She had some nausea. She has been taking her acid reflux medicine and this does not feel like her typical reflux symptoms. Has had some chest pain in this location as recently as last week resolved with two aspirin. This was not associated with exertion. She did have shortness of breath earlier today when she was grocery shopping. Additionally, she does report dysphagia "feeling food stick in her throat in the middle of her chest". She is smoking around 1/4 ppd, no ETOH. She says she is taking her medications as prescribed. Mom had CAD in her 58's with MI x 2.   Has been drinking a lot of water "everything" thirsty all the time. Peeing a lot as well. CBG's have been 220 - 240 lately. Has been getting steroid shots in her back as recently as April. Getting cramps daily. No constipation, no diarrhea.  In the ED, EKG unrevealing, troponins have been normal, lab work was significant for a sodium of 124, chloride 94, Cr is 1.11 improved from previous, Hgb 10.3, MCV 72. Vitals with elevated bp's initially, she was given a second dose of her home medicine with improvement in BP's. CXR normal.   Review Of Systems: Per HPI with the following additions: none Otherwise the remainder of the systems were negative.  Patient Active Problem List   Diagnosis Date Noted  . Chronic diastolic heart failure ([place])   . Symptomatic anemia 02/11/2016  . Occult GI bleeding   . AKI (acute kidney injury) ([place])   . Faintness   . Uncontrolled type 2 diabetes mellitus with ketoacidosis without coma, with long-term current use of insulin ([place])   . Essential hypertension   . GERD (gastroesophageal reflux disease) 05/17/2014  . Carotid artery  disease ([place]) 05/17/2014  . Diabetes ([place]) 04/26/2014  . Microcytic anemia 04/26/2014  . Chest pain 04/25/2014  . SOB (shortness of breath) 04/25/2014  . Carotid artery bruit 04/25/2014  . Hypertension   . Hyperlipidemia   . Gout   . Chronic back pain     Past Medical History: Past Medical History  Diagnosis Date  . Gout   . Chronic back pain   . Anxiety   . Hypertension   . Hyperlipidemia   . AKI (acute kidney injury) ([place]) 01/2016  . GI bleed 01/2016  . Diabetes mellitus     INSULIN DEPEDENT  . Carotid artery disease [facility])     Past Surgical History: Past Surgical History  Procedure Laterality Date  . Lymph gland excision    . Cervical fusion    . Cesarean section    . Shoulder surgery      left side  . Knee arthroscopy    .  Esophagogastroduodenoscopy Left 02/12/2016    Procedure: ESOPHAGOGASTRODUODENOSCOPY (EGD);  Surgeon: Arta Silence, MD;  Location: [facility] ENDOSCOPY;  Service: Endoscopy;  Laterality: Left;    Social History: Social History  Substance Use Topics  . Smoking status: Current Every Day Smoker -- 0.50 packs/day for 40 years    Types: Cigarettes  . Smokeless tobacco: Former Systems developer    Types: Snuff, Chew    Quit date: 11/09/1966  . Alcohol Use: No   Additional social history: none  Please also refer to relevant sections of EMR.  Family History: Family History  Problem Relation Age of Onset  . Diabetes Mother   . Heart disease Mother   . Kidney failure Mother   . Diabetes Sister    (If not completed, MUST add something in)  Allergies and Medications: No Known Allergies No current facility-administered medications on file prior to encounter.   Current Outpatient Prescriptions on File Prior to Encounter  Medication Sig Dispense Refill  . allopurinol (ZYLOPRIM) 100 MG tablet Take 100 mg by mouth 2 (two) times daily.     Marland Kitchen ALPRAZolam (XANAX) 0.5 MG tablet Take 0.25 mg by mouth 2 (two) times daily.     Marland Kitchen amLODipine (NORVASC) 10 MG tablet Take 10  mg by mouth daily with supper.     Marland Kitchen aspirin 81 MG chewable tablet Chew 1 tablet (81 mg total) by mouth daily. (Patient taking differently: Chew 81 mg by mouth 2 (two) times daily. ) 30 tablet 0  . insulin glargine (LANTUS) 100 UNIT/ML injection Inject 26 Units into the skin 3 (three) times daily with meals.     . nitroGLYCERIN (NITROSTAT) 0.4 MG SL tablet Place 1 tablet (0.4 mg total) under the tongue every 5 (five) minutes as needed for chest pain. 30 tablet 0  . oxyCODONE-acetaminophen (PERCOCET) 10-325 MG tablet Take 1 tablet by mouth every 6 (six) hours as needed for pain.     . pantoprazole (PROTONIX) 40 MG tablet TAKE 1 TABLET(40 MG) BY MOUTH TWICE DAILY BEFORE A MEAL 60 tablet 0  . NICOTINE STEP 2 14 MG/24HR patch PLACE 1 PATCH ONTO THE SKIN DAILY (Patient not taking: Reported on 04/19/2016) 28 patch 0  . rosuvastatin (CRESTOR) 40 MG tablet Take 1 tablet (40 mg total) by mouth daily. (Patient not taking: Reported on 04/19/2016) 90 tablet 3    Objective: BP 140/58 mmHg  Pulse 62  Temp(Src) 98.5 F (36.9 C) (Oral)  Resp 13  Ht 5\' 3"  (1.6 m)  Wt 130 lb (58.968 kg)  BMI 23.03 kg/m2  SpO2 99% Exam: General: NAD, resting comfortably in bed Eyes: EOMI, pERRLA ENTM: Nares patent, O/P clear, no erythema Neck: FROM, SUpple Cardiovascular: RRR, no MGR, normal S1/S2, 2+ distal pulses.  Respiratory: CTA B, appropriate rate, unlabored.  Abdomen: S,NT, ND, +BS MSK: No edema, nontender, MAEW Skin: no rashes, no lesions.  Neuro: no focal deficits noted.  Psych: appropriate mood /affect.   Labs and Imaging: CBC BMET   Recent Labs Lab 04/19/16 2042  WBC 9.0  HGB 10.3*  HCT 33.2*  PLT 334    Recent Labs Lab 04/19/16 2042  NA 124*  K 4.2  CL 94*  CO2 22  BUN 14  CREATININE 1.11*  GLUCOSE 263*  CALCIUM 9.2     EKG- no acute changes Trop - Neg x 2 CXR - no acute process.   Aquilla Hacker, MD 04/19/2016, 11:19 PM PGY-2, Center Intern pager:  [phone], text pages welcome

## 2016-04-19 NOTE — ED Notes (Signed)
Attempted report x1. 

## 2016-04-19 NOTE — ED Notes (Signed)
Patient transported to X-ray 

## 2016-04-19 NOTE — ED Notes (Signed)
Pt. Was sitting on the couch , developed lt. Side chest pain it started at 17:30, it radiated to her lt. Shoulder, she had sob, nausea, skin was dry.  Pt. Received 4 mg IV Zofran, 3 nitroSL and 324 mg ASA.  No change in the pain with the nitro 8/10.

## 2016-04-19 NOTE — ED Provider Notes (Signed)
CSN: [id number]     Arrival date & time 04/19/16  1842 History   First MD Initiated Contact with Patient 04/19/16 1902     Chief Complaint  Patient presents with  . Chest Pain    Patient is a 60 y.o. female presenting with chest pain. The history is provided by the patient.  Chest Pain Pain location:  Substernal area and L chest Pain quality: pressure   Pain quality comment:  Heavby Pain radiates to:  L arm Pain radiates to the back: no   Pain severity now: 8/10. Onset quality:  Gradual Duration:  2 hours Timing:  Constant Chronicity:  Recurrent Context: at rest and stress   Context comment:  Did eat prior, has hx of reflux on PPI. Ineffective treatments: ntg. Associated symptoms: nausea and shortness of breath   Associated symptoms: no abdominal pain, no back pain, no diaphoresis, no fatigue, no fever, no heartburn, no lower extremity edema, no near-syncope, no numbness, no orthopnea, no PND, no syncope, not vomiting and no weakness   Risk factors: diabetes mellitus, high cholesterol, hypertension and smoking   Risk factors: no coronary artery disease, not female and no prior DVT/PE   asa 324mg  and 3 NTG by ems.   Admitted 2 mos ago for UGIB in setting of NSAIDs and plavix. No longwer taking either.  +asa.  No drug use.  No melena, abd pain or hematemesis.  Has burning taste in mouth.    Past Medical History  Diagnosis Date  . Gout   . Chronic back pain   . Anxiety   . Hypertension   . Hyperlipidemia   . AKI (acute kidney injury) ([place]) 01/2016  . GI bleed 01/2016  . Diabetes mellitus     INSULIN DEPEDENT  . Carotid artery disease [facility])    Past Surgical History  Procedure Laterality Date  . Lymph gland excision    . Cervical fusion    . Cesarean section    . Shoulder surgery      left side  . Knee arthroscopy    . Esophagogastroduodenoscopy Left 02/12/2016    Procedure: ESOPHAGOGASTRODUODENOSCOPY (EGD);  Surgeon: Arta Silence, MD;  Location: [facility] ENDOSCOPY;  Service:  Endoscopy;  Laterality: Left;   Family History  Problem Relation Age of Onset  . Diabetes Mother   . Heart disease Mother   . Kidney failure Mother   . Diabetes Sister    Social History  Substance Use Topics  . Smoking status: Current Every Day Smoker -- 0.50 packs/day for 40 years    Types: Cigarettes  . Smokeless tobacco: Former Systems developer    Types: Snuff, Chew    Quit date: 11/09/1966  . Alcohol Use: No   OB History    Gravida Para Term Preterm AB TAB SAB Ectopic Multiple Living   5 5 4 1      5      Review of Systems  Constitutional: Negative for fever, diaphoresis and fatigue.  Respiratory: Positive for shortness of breath.   Cardiovascular: Positive for chest pain. Negative for orthopnea, syncope, PND and near-syncope.  Gastrointestinal: Positive for nausea. Negative for heartburn, vomiting and abdominal pain.  Musculoskeletal: Negative for back pain.  Skin: Negative for wound.  Neurological: Negative for weakness and numbness.  All other systems reviewed and are negative.   Allergies  Review of patient's allergies indicates no known allergies.  Home Medications   Prior to Admission medications   Medication Sig Start Date End Date Taking? Authorizing Provider  allopurinol (  ZYLOPRIM) 100 MG tablet Take 100 mg by mouth 2 (two) times daily.    Yes Historical Provider, MD  ALPRAZolam Duanne Moron) 0.5 MG tablet Take 0.25 mg by mouth 2 (two) times daily.    Yes Historical Provider, MD  amitriptyline (ELAVIL) 25 MG tablet Take 50 mg by mouth at bedtime. 04/15/16  Yes Historical Provider, MD  amLODipine (NORVASC) 10 MG tablet Take 10 mg by mouth daily with supper.  03/08/14  Yes Historical Provider, MD  aspirin 81 MG chewable tablet Chew 1 tablet (81 mg total) by mouth daily. Patient taking differently: Chew 81 mg by mouth 2 (two) times daily.  03/25/14  Yes Tatyana Kirichenko, PA-C  cloNIDine (CATAPRES) 0.3 MG tablet Take 0.3 mg by mouth 3 (three) times daily.   Yes Historical Provider,  MD  insulin glargine (LANTUS) 100 UNIT/ML injection Inject 26 Units into the skin 3 (three) times daily with meals.    Yes Historical Provider, MD  IRON PO Take 1 tablet by mouth 2 (two) times daily.   Yes Historical Provider, MD  lisinopril (PRINIVIL,ZESTRIL) 20 MG tablet Take 20 mg by mouth daily. 03/13/16  Yes Historical Provider, MD  nicotine (NICOTROL) 10 MG inhaler Inhale 1 continuous puffing into the lungs every 2 (two) hours as needed for smoking cessation.   Yes Historical Provider, MD  nicotine polacrilex (NICORETTE) 2 MG gum Take 2 mg by mouth every 2 (two) hours as needed for smoking cessation.   Yes Historical Provider, MD  nitroGLYCERIN (NITROSTAT) 0.4 MG SL tablet Place 1 tablet (0.4 mg total) under the tongue every 5 (five) minutes as needed for chest pain. 04/26/14  Yes Janece Canterbury, MD  OVER THE COUNTER MEDICATION Apply 1 application topically at bedtime. Cream for neuropathy and gout pain in feet   Yes Historical Provider, MD  OVER THE COUNTER MEDICATION Apply 1 application topically at bedtime. Cream for back and shoulder pain   Yes Historical Provider, MD  OVER THE COUNTER MEDICATION Place 1 drop into both eyes 2 (two) times daily. Lubricating eye drop   Yes Historical Provider, MD  oxyCODONE-acetaminophen (PERCOCET) 10-325 MG tablet Take 1 tablet by mouth every 6 (six) hours as needed for pain.    Yes Historical Provider, MD  pantoprazole (PROTONIX) 40 MG tablet TAKE 1 TABLET(40 MG) BY MOUTH TWICE DAILY BEFORE A MEAL 04/15/16  Yes Sela Hua, MD  rosuvastatin (CRESTOR) 20 MG tablet Take 20 mg by mouth at bedtime.   Yes Historical Provider, MD  NICOTINE STEP 2 14 MG/24HR patch PLACE 1 PATCH ONTO THE SKIN DAILY Patient not taking: Reported on 04/19/2016 03/11/16   Sela Hua, MD  rosuvastatin (CRESTOR) 40 MG tablet Take 1 tablet (40 mg total) by mouth daily. Patient not taking: Reported on 04/19/2016 02/29/16   Sueanne Margarita, MD   BP 158/55 mmHg  Pulse 67  Temp(Src) 98.2 F  (36.8 C) (Oral)  Resp 18  Ht 5\' 3"  (1.6 m)  Wt 68.9 kg  BMI 26.91 kg/m2  SpO2 100% Physical Exam  Constitutional: She is oriented to person, place, and time. She appears well-developed and well-nourished. No distress.  HENT:  Head: Normocephalic and atraumatic.  Nose: Nose normal.  Eyes: Conjunctivae are normal.  Neck: Normal range of motion. Neck supple. No tracheal deviation present.  Cardiovascular: Normal rate, regular rhythm and normal heart sounds.   No murmur heard. Equal radials  Pulmonary/Chest: Effort normal and breath sounds normal. No respiratory distress. She has no wheezes. She has no  rales. She exhibits tenderness (does not reproduce chest pain).  Abdominal: Soft. Bowel sounds are normal. She exhibits no distension and no mass. There is no tenderness.  Musculoskeletal: Normal range of motion. She exhibits no edema.  No lower extremity edema, calf tenderness, warmth, erythema or palpable cords   Neurological: She is alert and oriented to person, place, and time.  Alert and oriented x3. CN 3-12 tested and without deficit. 5/5 muscle strength in all extremities with flexion and extension.  Normal bulk and tone.  No sensory deficit to light touch.  gait normal.   Symmetric and equal 2+ patellar and brachioradialis DTRs.  No pronator drift.  Normal heel-to-shin and finger-to-nose.  Toes flexor bilaterally.    Skin: Skin is warm and dry. No rash noted.  Psychiatric: She has a normal mood and affect.  Nursing note and vitals reviewed.   ED Course  Procedures (including critical care time) Labs Review Labs Reviewed  CBC - Abnormal; Notable for the following:    Hemoglobin 10.3 (*)    HCT 33.2 (*)    MCV 72.3 (*)    MCH 22.4 (*)    All other components within normal limits  COMPREHENSIVE METABOLIC PANEL - Abnormal; Notable for the following:    Sodium 124 (*)    Chloride 94 (*)    Glucose, Bld 263 (*)    Creatinine, Ser 1.11 (*)    Total Bilirubin 0.2 (*)    GFR  calc non Af Amer 53 (*)    All other components within normal limits  URINALYSIS, ROUTINE W REFLEX MICROSCOPIC (NOT AT [facility]) - Abnormal; Notable for the following:    Specific Gravity, Urine 1.004 (*)    All other components within normal limits  TROPONIN I - Abnormal; Notable for the following:    Troponin I 0.04 (*)    All other components within normal limits  BASIC METABOLIC PANEL - Abnormal; Notable for the following:    Sodium 128 (*)    Chloride 99 (*)    Glucose, Bld 172 (*)    All other components within normal limits  CBC - Abnormal; Notable for the following:    Hemoglobin 10.4 (*)    HCT 32.8 (*)    MCV 72.2 (*)    MCH 22.9 (*)    All other components within normal limits  LIPID PANEL - Abnormal; Notable for the following:    Cholesterol 265 (*)    LDL Cholesterol 157 (*)    All other components within normal limits  MAGNESIUM - Abnormal; Notable for the following:    Magnesium 1.4 (*)    All other components within normal limits  OSMOLALITY - Abnormal; Notable for the following:    Osmolality 273 (*)    All other components within normal limits  TROPONIN I  PHOSPHORUS  CORTISOL  TROPONIN I  TROPONIN I  TSH  OSMOLALITY, URINE  URINE RAPID DRUG SCREEN, HOSP PERFORMED  SODIUM, URINE, RANDOM  CREATININE, URINE, RANDOM  I-STAT TROPOININ, ED    Imaging Review Dg Chest 2 View  04/19/2016  CLINICAL DATA:  Patient with left-sided chest pain. EXAM: CHEST  2 VIEW COMPARISON:  Chest radiograph 02/11/2016. FINDINGS: Anterior cervical spinal fusion hardware. Normal cardiac and mediastinal contours. No consolidative pulmonary opacities. No pleural effusion or pneumothorax. Radiodensities project over the left hemi thorax. Thoracic spine degenerative changes. IMPRESSION: No acute cardiopulmonary process. Electronically Signed   By: Lovey Newcomer M.D.   On: 04/19/2016 20:15   I have  personally reviewed and evaluated these images and lab results as part of my medical  decision-making.   EKG Interpretation   Date/Time:  Saturday Apr 19 2016 18:59:06 EDT Ventricular Rate:  88 PR Interval:  188 QRS Duration: 81 QT Interval:  350 QTC Calculation: 423 R Axis:   69 Text Interpretation:  Sinus rhythm Probable anteroseptal infarct, old No  significant change since last tracing Confirmed by [facility] MD, [place]  ([id number]) on 04/19/2016 7:13:08 PM      MDM   Final diagnoses:  Hyponatremia  Left sided chest pain    Stress test 2 yrs ago negative. LAst echo with normal EF, grade 1 diastolic dysfunction.   Took BP meds today., Normally SBP 150s.  HTN here, gave home meds.  Hx of PUD, has sour tasdte in mouth, consider gastritis, GI cocktail did not help sx.   Not in CHF exacerbation clinically. No hypoxia or pulm edema.   HEART score above 5.  Repeat trop barely positive to 0.04.   Left arm described as heavy not sensory deficit. Neuro exam benign.   Doubt dissection, pain resolving, mediastinum not wide, equal radial pulses.   Doubt PE, no hypoxia, hemoptysis, s/s DVT or tachycardia.   +hyponatremia.   Will need further risk modification and stratification.  May need further cardiac testing.  Admitted to family med service with improved chest pain.    Tammy Sours, MD 04/20/16 [id number]  Gareth Morgan, MD 04/21/16 [id number]

## 2016-04-19 NOTE — ED Notes (Signed)
Attempted to draw labs without success, phlebotomy notified.

## 2016-04-20 DIAGNOSIS — E871 Hypo-osmolality and hyponatremia: Secondary | ICD-10-CM | POA: Insufficient documentation

## 2016-04-20 DIAGNOSIS — K219 Gastro-esophageal reflux disease without esophagitis: Secondary | ICD-10-CM

## 2016-04-20 DIAGNOSIS — I10 Essential (primary) hypertension: Secondary | ICD-10-CM

## 2016-04-20 DIAGNOSIS — R079 Chest pain, unspecified: Secondary | ICD-10-CM | POA: Diagnosis not present

## 2016-04-20 DIAGNOSIS — R0789 Other chest pain: Secondary | ICD-10-CM | POA: Diagnosis not present

## 2016-04-20 DIAGNOSIS — I6523 Occlusion and stenosis of bilateral carotid arteries: Secondary | ICD-10-CM

## 2016-04-20 DIAGNOSIS — E119 Type 2 diabetes mellitus without complications: Secondary | ICD-10-CM | POA: Diagnosis not present

## 2016-04-20 DIAGNOSIS — E785 Hyperlipidemia, unspecified: Secondary | ICD-10-CM

## 2016-04-20 LAB — CBC
HEMATOCRIT: 32.8 % — AB (ref 36.0–46.0)
Hemoglobin: 10.4 g/dL — ABNORMAL LOW (ref 12.0–15.0)
MCH: 22.9 pg — ABNORMAL LOW (ref 26.0–34.0)
MCHC: 31.7 g/dL (ref 30.0–36.0)
MCV: 72.2 fL — AB (ref 78.0–100.0)
Platelets: 339 10*3/uL (ref 150–400)
RBC: 4.54 MIL/uL (ref 3.87–5.11)
RDW: 14.9 % (ref 11.5–15.5)
WBC: 8.7 10*3/uL (ref 4.0–10.5)

## 2016-04-20 LAB — BASIC METABOLIC PANEL
ANION GAP: 7 (ref 5–15)
BUN: 13 mg/dL (ref 6–20)
CHLORIDE: 99 mmol/L — AB (ref 101–111)
CO2: 22 mmol/L (ref 22–32)
Calcium: 9 mg/dL (ref 8.9–10.3)
Creatinine, Ser: 1 mg/dL (ref 0.44–1.00)
Glucose, Bld: 172 mg/dL — ABNORMAL HIGH (ref 65–99)
POTASSIUM: 4.1 mmol/L (ref 3.5–5.1)
SODIUM: 128 mmol/L — AB (ref 135–145)

## 2016-04-20 LAB — MAGNESIUM: Magnesium: 1.4 mg/dL — ABNORMAL LOW (ref 1.7–2.4)

## 2016-04-20 LAB — URINALYSIS, ROUTINE W REFLEX MICROSCOPIC
BILIRUBIN URINE: NEGATIVE
GLUCOSE, UA: NEGATIVE mg/dL
HGB URINE DIPSTICK: NEGATIVE
Ketones, ur: NEGATIVE mg/dL
Leukocytes, UA: NEGATIVE
Nitrite: NEGATIVE
PROTEIN: NEGATIVE mg/dL
SPECIFIC GRAVITY, URINE: 1.004 — AB (ref 1.005–1.030)
pH: 6.5 (ref 5.0–8.0)

## 2016-04-20 LAB — LIPID PANEL
CHOL/HDL RATIO: 3.1 ratio
Cholesterol: 265 mg/dL — ABNORMAL HIGH (ref 0–200)
HDL: 85 mg/dL (ref >40)
LDL CALC: 157 mg/dL — AB (ref 0–99)
TRIGLYCERIDES: 113 mg/dL (ref <150)
VLDL: 23 mg/dL (ref 0–40)

## 2016-04-20 LAB — RAPID URINE DRUG SCREEN, HOSP PERFORMED
AMPHETAMINES: NOT DETECTED
BENZODIAZEPINES: NOT DETECTED
Barbiturates: NOT DETECTED
Cocaine: NOT DETECTED
OPIATES: POSITIVE — AB
TETRAHYDROCANNABINOL: NOT DETECTED

## 2016-04-20 LAB — TROPONIN I
Troponin I: 0.04 ng/mL — ABNORMAL HIGH (ref <0.031)
Troponin I: 0.04 ng/mL — ABNORMAL HIGH (ref <0.031)

## 2016-04-20 LAB — GLUCOSE, CAPILLARY
Glucose-Capillary: 160 mg/dL — ABNORMAL HIGH (ref 65–99)
Glucose-Capillary: 236 mg/dL — ABNORMAL HIGH (ref 65–99)

## 2016-04-20 LAB — CORTISOL: CORTISOL PLASMA: 0.8 ug/dL

## 2016-04-20 LAB — OSMOLALITY, URINE: Osmolality, Ur: 145 mOsm/kg — ABNORMAL LOW (ref 300–900)

## 2016-04-20 LAB — SODIUM, URINE, RANDOM: Sodium, Ur: 50 mmol/L

## 2016-04-20 LAB — PHOSPHORUS: PHOSPHORUS: 4 mg/dL (ref 2.5–4.6)

## 2016-04-20 LAB — OSMOLALITY: OSMOLALITY: 273 mosm/kg — AB (ref 275–295)

## 2016-04-20 LAB — CREATININE, URINE, RANDOM: Creatinine, Urine: 12.35 mg/dL

## 2016-04-20 LAB — TSH: TSH: 1.251 u[IU]/mL (ref 0.350–4.500)

## 2016-04-20 MED ORDER — ASPIRIN EC 325 MG PO TBEC: 325.0000 mg | DELAYED_RELEASE_TABLET | Freq: Every day | ORAL | Status: DC

## 2016-04-20 MED ORDER — NICOTINE 7 MG/24HR TD PT24
7.0000 mg | MEDICATED_PATCH | Freq: Every day | TRANSDERMAL | Status: DC
Start: 2016-04-20 — End: 2016-04-20
  Administered 2016-04-20 (×2): 7 mg via TRANSDERMAL
  Filled 2016-04-20 (×2): qty 1

## 2016-04-20 MED ORDER — LABETALOL HCL 5 MG/ML IV SOLN: 10.0000 mg | INTRAVENOUS | Status: DC | PRN

## 2016-04-20 MED ORDER — MAGNESIUM SULFATE 2 GM/50ML IV SOLN
2.0000 g | Freq: Once | INTRAVENOUS | Status: AC
  Administered 2016-04-20: 2 g via INTRAVENOUS
  Filled 2016-04-20: qty 50

## 2016-04-20 MED ORDER — ONDANSETRON HCL 4 MG/2ML IJ SOLN: 4.0000 mg | Freq: Four times a day (QID) | INTRAMUSCULAR | Status: DC | PRN

## 2016-04-20 MED ORDER — MORPHINE SULFATE (PF) 2 MG/ML IV SOLN
2.0000 mg | INTRAVENOUS | Status: DC | PRN
  Administered 2016-04-20: 2 mg via INTRAVENOUS
  Filled 2016-04-20: qty 1

## 2016-04-20 MED ORDER — PANTOPRAZOLE SODIUM 40 MG PO TBEC
40.0000 mg | DELAYED_RELEASE_TABLET | Freq: Two times a day (BID) | ORAL | Status: DC
Start: 2016-04-20 — End: 2016-04-20
  Administered 2016-04-20 (×2): 40 mg via ORAL
  Filled 2016-04-20 (×2): qty 1

## 2016-04-20 MED ORDER — AMITRIPTYLINE HCL 50 MG PO TABS
50.0000 mg | ORAL_TABLET | Freq: Every day | ORAL | Status: DC
  Administered 2016-04-20: 50 mg via ORAL
  Filled 2016-04-20: qty 1

## 2016-04-20 MED ORDER — CLONIDINE HCL 0.3 MG PO TABS
0.3000 mg | ORAL_TABLET | Freq: Three times a day (TID) | ORAL | Status: DC
  Administered 2016-04-20 (×2): 0.3 mg via ORAL
  Filled 2016-04-20 (×3): qty 1

## 2016-04-20 MED ORDER — OXYCODONE-ACETAMINOPHEN 10-325 MG PO TABS: 1.0000 | ORAL_TABLET | Freq: Four times a day (QID) | ORAL | Status: DC | PRN

## 2016-04-20 MED ORDER — ACETAMINOPHEN 325 MG PO TABS: 650.0000 mg | ORAL_TABLET | ORAL | Status: DC | PRN

## 2016-04-20 MED ORDER — ASPIRIN 81 MG PO CHEW
81.0000 mg | CHEWABLE_TABLET | Freq: Every day | ORAL | Status: DC
  Administered 2016-04-20: 81 mg via ORAL
  Filled 2016-04-20: qty 1

## 2016-04-20 MED ORDER — INSULIN GLARGINE 100 UNIT/ML ~~LOC~~ SOLN
26.0000 [IU] | Freq: Every day | SUBCUTANEOUS | Status: DC
  Filled 2016-04-20 (×2): qty 0.26

## 2016-04-20 MED ORDER — ALPRAZOLAM 0.25 MG PO TABS
0.2500 mg | ORAL_TABLET | Freq: Two times a day (BID) | ORAL | Status: DC
  Administered 2016-04-20 (×2): 0.25 mg via ORAL
  Filled 2016-04-20 (×2): qty 1

## 2016-04-20 MED ORDER — LISINOPRIL 20 MG PO TABS
20.0000 mg | ORAL_TABLET | Freq: Every day | ORAL | Status: DC
  Administered 2016-04-20: 20 mg via ORAL
  Filled 2016-04-20: qty 1

## 2016-04-20 MED ORDER — ROSUVASTATIN CALCIUM 40 MG PO TABS: 40.0000 mg | ORAL_TABLET | Freq: Every day | ORAL | Status: DC

## 2016-04-20 MED ORDER — OXYCODONE-ACETAMINOPHEN 5-325 MG PO TABS
1.0000 | ORAL_TABLET | Freq: Four times a day (QID) | ORAL | Status: DC | PRN
  Administered 2016-04-20: 1 via ORAL
  Filled 2016-04-20: qty 1

## 2016-04-20 MED ORDER — INSULIN ASPART 100 UNIT/ML ~~LOC~~ SOLN
0.0000 [IU] | Freq: Three times a day (TID) | SUBCUTANEOUS | Status: DC
  Administered 2016-04-20: 3 [IU] via SUBCUTANEOUS

## 2016-04-20 MED ORDER — NITROGLYCERIN 0.4 MG SL SUBL: 0.4000 mg | SUBLINGUAL_TABLET | SUBLINGUAL | Status: DC | PRN

## 2016-04-20 MED ORDER — ALLOPURINOL 100 MG PO TABS
100.0000 mg | ORAL_TABLET | Freq: Two times a day (BID) | ORAL | Status: DC
  Administered 2016-04-20 (×2): 100 mg via ORAL
  Filled 2016-04-20 (×2): qty 1

## 2016-04-20 MED ORDER — OXYCODONE HCL 5 MG PO TABS
5.0000 mg | ORAL_TABLET | Freq: Four times a day (QID) | ORAL | Status: DC | PRN
  Administered 2016-04-20: 5 mg via ORAL
  Filled 2016-04-20: qty 1

## 2016-04-20 MED ORDER — ROSUVASTATIN CALCIUM 20 MG PO TABS
20.0000 mg | ORAL_TABLET | Freq: Every day | ORAL | Status: DC
  Administered 2016-04-20: 20 mg via ORAL
  Filled 2016-04-20 (×2): qty 1

## 2016-04-20 MED ORDER — FERROUS SULFATE 325 (65 FE) MG PO TABS
325.0000 mg | ORAL_TABLET | Freq: Two times a day (BID) | ORAL | Status: DC
  Administered 2016-04-20: 325 mg via ORAL
  Filled 2016-04-20: qty 1

## 2016-04-20 MED ORDER — ENOXAPARIN SODIUM 40 MG/0.4ML ~~LOC~~ SOLN
40.0000 mg | Freq: Every day | SUBCUTANEOUS | Status: DC
  Administered 2016-04-20: 40 mg via SUBCUTANEOUS
  Filled 2016-04-20: qty 0.4

## 2016-04-20 NOTE — Discharge Summary (Signed)
[facility] Discharge Summary  Patient name: Alejandra Hernandez Medical record number: [id number] Date of birth: March 03, 1956 Age: 60 y.o. Gender: female Date of Admission: 04/19/2016  Date of Discharge: 04/20/2016 Admitting Physician: Blane Ohara McDiarmid, MD  Primary Care Provider: Antonietta Jewel, MD Consultants: Cardiology  Indication for Hospitalization: Chest pain  Discharge Diagnoses/Problem List:  Chest pain, hyponatremia, HTN, Gout, HDL, carotid stenosis, T2DM, GERD, Anemia, GI Bleed, Diastolic cardiac dysfunction, chronic back pain  Disposition: Home  Discharge Condition: Improved  Discharge Exam: See progress note for day of discharge.  Brief Hospital Course:  Patient presented to the emergency room with left sided chest pain and mild nausea. She was admitted for ACS rule out. Her hospital course by problem is outlined below:  Chest Pain. Patient had negative working up including negative serial troponin and EKGs. Patient seen by cardiology who recommended outpatient follow up with possible outpatient stress testing. Patient discharged on a regimen of aspirin, crestor, labetalol, amlodipine, and lisinopril. Differential also includes hypertensive emergency (hypertensive to 190s on admission) in addition to esophageal spasms (has history of hiatal hernia and chest pain in the past has been responsive to nitroglycerin).   Hyponatremia. Patient noted to have hyponatremia to 128 (corrected) on admission. Evaluation during this hospitalization included a serum osmolality of 273, urine osmolality 145, urine sodium 50, and a normal TSH. On the day of discharge, patient's sodium improved to 130.   Hypertension. Patient noted to be hypertensive to 190s/70s on admission. She was started back on her home antihypertensives and remained normotensive throughout the rest of her stay.  The patient's other chronic medical conditions were stable during this admission and no  changes were made to her medications.   Issues for Follow Up:  1. Chest Pain - Will have follow with cardiology for possible outpatient stress testing. 2. Hyponatremia - Recommend rechecking BMP on follow up. Patient on amitriptyline which can cause hyponatremia/SIADH. If persistently low or if decreasing, would consider stopping.  3. History of dysphagia - on bid PPI. She is supposed to follow up with GI as outpatient. 4. Carotid stenosis - Previously on plavix and ASA. On admission was only on aspirin, consider restarting plavix as hgb stabilizes.  Significant Procedures: None  Significant Labs and Imaging:   Recent Labs Lab 04/19/16 2042 04/20/16 0100  WBC 9.0 8.7  HGB 10.3* 10.4*  HCT 33.2* 32.8*  PLT 334 339    Recent Labs Lab 04/19/16 2042 04/20/16 0014 04/20/16 0100  NA 124* 128*  --   K 4.2 4.1  --   CL 94* 99*  --   CO2 22 22  --   GLUCOSE 263* 172*  --   BUN 14 13  --   CREATININE 1.11* 1.00  --   CALCIUM 9.2 9.0  --   MG  --   --  1.4*  PHOS  --   --  4.0  ALKPHOS 122  --   --   AST 25  --   --   ALT 28  --   --   ALBUMIN 3.6  --   --       Recent Labs Lab 04/19/16 2040 04/20/16 0014 04/20/16 0516 04/20/16 1105  TROPONINI 0.03 0.04* 0.04* <0.03   Serum osm 273, urine osm 145, urine sodium 50. TSH 1.251  Dg Chest 2 View  04/19/2016  CLINICAL DATA:  Patient with left-sided chest pain. EXAM: CHEST  2 VIEW COMPARISON:  Chest radiograph 02/11/2016. FINDINGS: Anterior cervical  spinal fusion hardware. Normal cardiac and mediastinal contours. No consolidative pulmonary opacities. No pleural effusion or pneumothorax. Radiodensities project over the left hemi thorax. Thoracic spine degenerative changes. IMPRESSION: No acute cardiopulmonary process. Electronically Signed   By: Lovey Newcomer M.D.   On: 04/19/2016 20:15   Results/Tests Pending at Time of Discharge: None  Discharge Medications:    Medication List    TAKE these medications         allopurinol 100 MG tablet  Commonly known as:  ZYLOPRIM  Take 100 mg by mouth 2 (two) times daily.     ALPRAZolam 0.5 MG tablet  Commonly known as:  XANAX  Take 0.25 mg by mouth 2 (two) times daily.     amitriptyline 25 MG tablet  Commonly known as:  ELAVIL  Take 50 mg by mouth at bedtime.     amLODipine 10 MG tablet  Commonly known as:  NORVASC  Take 10 mg by mouth daily with supper.     aspirin 81 MG chewable tablet  Chew 1 tablet (81 mg total) by mouth daily.     cloNIDine 0.3 MG tablet  Commonly known as:  CATAPRES  Take 0.3 mg by mouth 3 (three) times daily.     insulin glargine 100 UNIT/ML injection  Commonly known as:  LANTUS  Inject 26 Units into the skin 3 (three) times daily with meals.     IRON PO  Take 1 tablet by mouth 2 (two) times daily.     lisinopril 20 MG tablet  Commonly known as:  PRINIVIL,ZESTRIL  Take 20 mg by mouth daily.     nicotine polacrilex 2 MG gum  Commonly known as:  NICORETTE  Take 2 mg by mouth every 2 (two) hours as needed for smoking cessation.     nicotine 10 MG inhaler  Commonly known as:  NICOTROL  Inhale 1 continuous puffing into the lungs every 2 (two) hours as needed for smoking cessation.     NICOTINE STEP 2 14 mg/24hr patch  Generic drug:  nicotine  PLACE 1 PATCH ONTO THE SKIN DAILY     nitroGLYCERIN 0.4 MG SL tablet  Commonly known as:  NITROSTAT  Place 1 tablet (0.4 mg total) under the tongue every 5 (five) minutes as needed for chest pain.     OVER THE COUNTER MEDICATION  Apply 1 application topically at bedtime. Cream for neuropathy and gout pain in feet     OVER THE COUNTER MEDICATION  Apply 1 application topically at bedtime. Cream for back and shoulder pain     OVER THE COUNTER MEDICATION  Place 1 drop into both eyes 2 (two) times daily. Lubricating eye drop     oxyCODONE-acetaminophen 10-325 MG tablet  Commonly known as:  PERCOCET  Take 1 tablet by mouth every 6 (six) hours as needed for pain.      pantoprazole 40 MG tablet  Commonly known as:  PROTONIX  TAKE 1 TABLET(40 MG) BY MOUTH TWICE DAILY BEFORE A MEAL     rosuvastatin 40 MG tablet  Commonly known as:  CRESTOR  Take 1 tablet (40 mg total) by mouth daily.        Discharge Instructions: Please refer to Patient Instructions section of EMR for full details.  Patient was counseled important signs and symptoms that should prompt return to medical care, changes in medications, dietary instructions, activity restrictions, and follow up appointments.   Follow-Up Appointments: Follow-up Information    Follow up with Phill Myron, MD.  Specialty:  Family Medicine   Why:  8:45AM for hospital follow up.    Contact information:   [place] [address] [phone]       Vivi Barrack, MD 04/20/2016, 3:04 PM PGY-2, [place]

## 2016-04-20 NOTE — Progress Notes (Signed)
Pt d/c home. No new rx. Pt voices no complaints

## 2016-04-20 NOTE — Progress Notes (Signed)
Family Medicine Teaching Service Daily Progress Note Intern Pager: [phone]  Patient name: Alejandra Hernandez Medical record number: [id number] Date of birth: 19-Sep-1956 Age: 60 y.o. Gender: female  Primary Care Provider: Antonietta Jewel, MD Consultants: None Code Status: Full  Pt Overview and Major Events to Date:  5/27 - Admitted with chest pain.   Assessment and Plan: [name] is a 61 y.o. female presenting with Chest Pain and Accelerated HTN . PMH is significant for HTN, Gout, HLD, Carotid Stenosis, DMII, GERD, Anemia, GI bleed, Diastolic Cardiac Dysfunction, Chronic Back Pain.   # Chest Pain- HEART score 4 on admission. Troponin negative x3. EKG without acute changes. CXR unremarkable. Recent echo 2 months ago with normal EF and G1DD. Recent upper endoscopy with small hiatal hernia.  Normal myoview 2 years ago. See Dr Radford Pax with [facility] cardiology. DDX includes esophageal spasm / GERD / in addtion to CAD.  - Cardiology consulted, appreciate assistance - Continue aspirin - nitro prn - morphine prn pain - LDL 157 - increase crestor to 40mg  daily.   # Hyponatremia - Na 128 (corrected) on admission. Reports drinking significant amounts of fluid and also urinating very frequently. On amitriptyline which can cause SIADH/hyponatremia.   Serum osm 273, urine osm 145, urine sodium 50. TSH wnl. - Fluid restrict - Mg low, will replete - Trend BMP - Check AM cortisol  # Accelerated HTN - BP 197/73 on admission. Now 100s/40s.  - Restarted home Clonidine, Norvasc 10mg , Lisinopril 20mg  - Labetalol prn  # GERD / Dysphagia - Recent upper endoscopy revealing duodenal ulceration. Was treated with protonix 40BID with plan to go to daily protonix after 8 weeks. Has "food getting stuck" feeling around the middle of her chest. Raises again the possibility of lower esophageal dysmotility / GERD dysfunction despite therapy. Has been on protonix 8 weeks now.  - Consider ambulatory ph - She is supposed  to f/u with GI as an outpatient. Need to ensure that this takes place.  - Will continue BID ppi for now.   # DMII - A1C 7.2 at last check 01/2016.  - 26 units of lantus daily per pt. She is not taking this TID.  - Novolog SSI  # HLD - continue crestor  # Gout - continue allopurinol  # Anemia - Iron deficiency anemia. hgb 10.3, MCV 72.3, she had a GI bleed in march that was thought to be contributing. Treating duodenal ulceration with protonix. She is on iron supplementation which she says she has been taking.  - Continue PO iron.   # Chronic Back pain - continue oxycodone here.   # Carotid Stenosis - she had been on plavix and ASA before for this, however plavix was held at her last hospitalization with plan to restart after GI bleeding had resolved.  - Consider restarting this hospitalization.   Disposition: Admitted pending above management.   Subjective:  Doing well this morning. Only with a small amount of left sided chest pain. Improved from yesterday. Chest pain did not resolve with nitrolgycerin.   Objective: Temp:  [97.6 F (36.4 C)-98.5 F (36.9 C)] 97.6 F (36.4 C) (05/28 0445) Pulse Rate:  [59-88] 59 (05/28 0445) Resp:  [13-22] 17 (05/28 0445) BP: (107-197)/(45-76) 107/47 mmHg (05/28 0445) SpO2:  [98 %-100 %] 98 % (05/28 0445) Weight:  [130 lb (58.968 kg)-151 lb 14.4 oz (68.9 kg)] 151 lb 14.4 oz (68.9 kg) (05/28 0042) Physical Exam: General: 60 year old female in NAD, lying in bed Cardiovascular: RRR, no  murmurs noted Respiratory: CTAB, NWOB Abdomen: S, NT, ND Extremities: WWP, no cyanosis or edema  Laboratory:  Recent Labs Lab 04/19/16 2042 04/20/16 0100  WBC 9.0 8.7  HGB 10.3* 10.4*  HCT 33.2* 32.8*  PLT 334 339    Recent Labs Lab 04/19/16 2042 04/20/16 0014  NA 124* 128*  K 4.2 4.1  CL 94* 99*  CO2 22 22  BUN 14 13  CREATININE 1.11* 1.00  CALCIUM 9.2 9.0  PROT 7.1  --   BILITOT 0.2*  --   ALKPHOS 122  --   ALT 28  --   AST 25  --    GLUCOSE 263* 172*      Recent Labs Lab 04/19/16 2040 04/20/16 0014 04/20/16 0516  TROPONINI 0.03 0.04* 0.04*   Serum osm 273, urine osm 145, urine sodium 50. TSH 1.251  Imaging/Diagnostic Tests: None New  Vivi Barrack, MD 04/20/2016, 8:58 AM PGY-2, [place] Intern pager: [phone], text pages welcome

## 2016-04-20 NOTE — Consult Note (Signed)
CARDIOLOGY CONSULT NOTE  Patient ID: Alejandra Hernandez MRN: [id number] DOB/AGE: 01-14-1956 60 y.o.  Admit date: 04/19/2016 Primary Physician: Antonietta Jewel, MD Referring Physician: Dimas Chyle MD  Reason for Consultation: chest pain  HPI: 60 yr old woman with PMH significant for hypertension, hyperlipidemia, GERD, bilateral carotid artery stenosis (60-79% 2016), admitted with sudden onset of chest pain after washing the dishes. Took SL nitroglycerin without relief. Located in upper left chest, then left inframammary region.  Still has some residual pain but much less so when compared to time of admission. BP 197/73 upon arrival, currently 116/52. ECG normal.  Troponins 0.03--> 0.04--> <0.03  Chest xray without acute cardiopulmonary process.  Echo 02/12/16 normal LVEF 65-70%, normal regional wall motion, grade 1 diastolic dysfunction, normal filling pressures.  Unremarkable nuclear MPI study 04/2014.   No Known Allergies  Current Facility-Administered Medications  Medication Dose Route Frequency Provider Last Rate Last Dose  . acetaminophen (TYLENOL) tablet 650 mg  650 mg Oral Q4H PRN Aquilla Hacker, MD      . allopurinol (ZYLOPRIM) tablet 100 mg  100 mg Oral BID Aquilla Hacker, MD   100 mg at 04/20/16 0933  . ALPRAZolam Duanne Moron) tablet 0.25 mg  0.25 mg Oral BID Aquilla Hacker, MD   0.25 mg at 04/20/16 0933  . amitriptyline (ELAVIL) tablet 50 mg  50 mg Oral QHS Aquilla Hacker, MD   50 mg at 04/20/16 0210  . amLODipine (NORVASC) tablet 10 mg  10 mg Oral Daily Tammy Sours, MD   10 mg at 04/20/16 0933  . aspirin chewable tablet 81 mg  81 mg Oral Daily Aquilla Hacker, MD   81 mg at 04/20/16 0933  . cloNIDine (CATAPRES) tablet 0.3 mg  0.3 mg Oral Q8H Aquilla Hacker, MD   0.3 mg at 04/20/16 0216  . enoxaparin (LOVENOX) injection 40 mg  40 mg Subcutaneous Daily Aquilla Hacker, MD   40 mg at 04/20/16 0934  . ferrous sulfate tablet 325 mg  325 mg Oral BID WC Aquilla Hacker, MD   325 mg at 04/20/16 0700  . insulin aspart (novoLOG) injection 0-9 Units  0-9 Units Subcutaneous TID WC Aquilla Hacker, MD   0 Units at 04/20/16 0700  . insulin glargine (LANTUS) injection 26 Units  26 Units Subcutaneous QHS Aquilla Hacker, MD   26 Units at 04/20/16 0145  . labetalol (NORMODYNE,TRANDATE) injection 10 mg  10 mg Intravenous Q2H PRN Aquilla Hacker, MD      . lisinopril (PRINIVIL,ZESTRIL) tablet 20 mg  20 mg Oral Daily Aquilla Hacker, MD   20 mg at 04/20/16 0933  . magnesium sulfate IVPB 2 g 50 mL  2 g Intravenous Once Vivi Barrack, MD      . morphine 2 MG/ML injection 2 mg  2 mg Intravenous Q2H PRN Aquilla Hacker, MD   2 mg at 04/20/16 0019  . nicotine (NICODERM CQ - dosed in mg/24 hr) patch 7 mg  7 mg Transdermal Daily Aquilla Hacker, MD   7 mg at 04/20/16 0933  . nitroGLYCERIN (NITROSTAT) SL tablet 0.4 mg  0.4 mg Sublingual Q5 min PRN Aquilla Hacker, MD      . ondansetron (ZOFRAN) injection 4 mg  4 mg Intravenous Q6H PRN Aquilla Hacker, MD      . oxyCODONE-acetaminophen (PERCOCET/ROXICET) 5-325 MG per tablet 1 tablet  1 tablet Oral Q6H PRN Blane Ohara McDiarmid, MD  1 tablet at 04/20/16 [id number]   And  . oxyCODONE (Oxy IR/ROXICODONE) immediate release tablet 5 mg  5 mg Oral Q6H PRN Blane Ohara McDiarmid, MD   5 mg at 04/20/16 0814  . pantoprazole (PROTONIX) EC tablet 40 mg  40 mg Oral BID Aquilla Hacker, MD   40 mg at 04/20/16 0933  . rosuvastatin (CRESTOR) tablet 40 mg  40 mg Oral q1800 Vivi Barrack, MD        Past Medical History  Diagnosis Date  . Gout   . Chronic back pain   . Anxiety   . Hypertension   . Hyperlipidemia   . AKI (acute kidney injury) ([place]) 01/2016  . GI bleed 01/2016  . Diabetes mellitus     INSULIN DEPEDENT  . Carotid artery disease [facility])     Past Surgical History  Procedure Laterality Date  . Lymph gland excision    . Cervical fusion    . Cesarean section    . Shoulder surgery      left side  . Knee arthroscopy    .  Esophagogastroduodenoscopy Left 02/12/2016    Procedure: ESOPHAGOGASTRODUODENOSCOPY (EGD);  Surgeon: Arta Silence, MD;  Location: [facility] ENDOSCOPY;  Service: Endoscopy;  Laterality: Left;    Social History   Social History  . Marital Status: Single    Spouse Name: N/A  . Number of Children: N/A  . Years of Education: N/A   Occupational History  . Not on file.   Social History Main Topics  . Smoking status: Current Every Day Smoker -- 0.50 packs/day for 40 years    Types: Cigarettes  . Smokeless tobacco: Former Systems developer    Types: Snuff, Chew    Quit date: 11/09/1966  . Alcohol Use: No  . Drug Use: No  . Sexual Activity: Not Currently   Other Topics Concern  . Not on file   Social History Narrative     No family history of premature CAD in 1st degree relatives.  Prior to Admission medications   Medication Sig Start Date End Date Taking? Authorizing Provider  allopurinol (ZYLOPRIM) 100 MG tablet Take 100 mg by mouth 2 (two) times daily.    Yes Historical Provider, MD  ALPRAZolam Duanne Moron) 0.5 MG tablet Take 0.25 mg by mouth 2 (two) times daily.    Yes Historical Provider, MD  amitriptyline (ELAVIL) 25 MG tablet Take 50 mg by mouth at bedtime. 04/15/16  Yes Historical Provider, MD  amLODipine (NORVASC) 10 MG tablet Take 10 mg by mouth daily with supper.  03/08/14  Yes Historical Provider, MD  aspirin 81 MG chewable tablet Chew 1 tablet (81 mg total) by mouth daily. Patient taking differently: Chew 81 mg by mouth 2 (two) times daily.  03/25/14  Yes Tatyana Kirichenko, PA-C  cloNIDine (CATAPRES) 0.3 MG tablet Take 0.3 mg by mouth 3 (three) times daily.   Yes Historical Provider, MD  insulin glargine (LANTUS) 100 UNIT/ML injection Inject 26 Units into the skin 3 (three) times daily with meals.    Yes Historical Provider, MD  IRON PO Take 1 tablet by mouth 2 (two) times daily.   Yes Historical Provider, MD  lisinopril (PRINIVIL,ZESTRIL) 20 MG tablet Take 20 mg by mouth daily. 03/13/16  Yes  Historical Provider, MD  nicotine (NICOTROL) 10 MG inhaler Inhale 1 continuous puffing into the lungs every 2 (two) hours as needed for smoking cessation.   Yes Historical Provider, MD  nicotine polacrilex (NICORETTE) 2 MG gum Take 2 mg by mouth  every 2 (two) hours as needed for smoking cessation.   Yes Historical Provider, MD  nitroGLYCERIN (NITROSTAT) 0.4 MG SL tablet Place 1 tablet (0.4 mg total) under the tongue every 5 (five) minutes as needed for chest pain. 04/26/14  Yes Janece Canterbury, MD  OVER THE COUNTER MEDICATION Apply 1 application topically at bedtime. Cream for neuropathy and gout pain in feet   Yes Historical Provider, MD  OVER THE COUNTER MEDICATION Apply 1 application topically at bedtime. Cream for back and shoulder pain   Yes Historical Provider, MD  OVER THE COUNTER MEDICATION Place 1 drop into both eyes 2 (two) times daily. Lubricating eye drop   Yes Historical Provider, MD  oxyCODONE-acetaminophen (PERCOCET) 10-325 MG tablet Take 1 tablet by mouth every 6 (six) hours as needed for pain.    Yes Historical Provider, MD  pantoprazole (PROTONIX) 40 MG tablet TAKE 1 TABLET(40 MG) BY MOUTH TWICE DAILY BEFORE A MEAL 04/15/16  Yes Sela Hua, MD  rosuvastatin (CRESTOR) 20 MG tablet Take 20 mg by mouth at bedtime.   Yes Historical Provider, MD  NICOTINE STEP 2 14 MG/24HR patch PLACE 1 PATCH ONTO THE SKIN DAILY Patient not taking: Reported on 04/19/2016 03/11/16   Sela Hua, MD  rosuvastatin (CRESTOR) 40 MG tablet Take 1 tablet (40 mg total) by mouth daily. Patient not taking: Reported on 04/19/2016 02/29/16   Sueanne Margarita, MD     Review of systems complete and found to be negative unless listed above in HPI     Physical exam Blood pressure 116/52, pulse 59, temperature 97.7 F (36.5 C), temperature source Oral, resp. rate 18, height 5\' 3"  (1.6 m), weight 151 lb 14.4 oz (68.9 kg), SpO2 100 %. General: NAD Neck: No JVD, no thyromegaly or thyroid nodule.  Lungs: Clear to  auscultation bilaterally with normal respiratory effort. CV: Nondisplaced PMI. Regular rate and rhythm, normal S1/S2, no S3/S4, no murmur.  No peripheral edema. Normal pedal pulses.  Abdomen: Soft, nontender, no hepatosplenomegaly, no distention.  Skin: Intact without lesions or rashes.  Neurologic: Alert and oriented x 3.  Psych: Normal affect. Extremities: No clubbing or cyanosis.  HEENT: Normal.   ECG: Most recent ECG reviewed.  Labs:   Lab Results  Component Value Date   WBC 8.7 04/20/2016   HGB 10.4* 04/20/2016   HCT 32.8* 04/20/2016   MCV 72.2* 04/20/2016   PLT 339 04/20/2016    Recent Labs Lab 04/19/16 2042 04/20/16 0014  NA 124* 128*  K 4.2 4.1  CL 94* 99*  CO2 22 22  BUN 14 13  CREATININE 1.11* 1.00  CALCIUM 9.2 9.0  PROT 7.1  --   BILITOT 0.2*  --   ALKPHOS 122  --   ALT 28  --   AST 25  --   GLUCOSE 263* 172*   Lab Results  Component Value Date   CKTOTAL 177 04/25/2014   CKMB 3.4 04/25/2014   TROPONINI <0.03 04/20/2016    Lab Results  Component Value Date   CHOL 265* 04/20/2016   CHOL 308* 09/25/2009   Lab Results  Component Value Date   HDL 85 04/20/2016   HDL 49 09/25/2009   Lab Results  Component Value Date   LDLCALC 157* 04/20/2016   LDLCALC See Comment mg/dL 09/25/2009   Lab Results  Component Value Date   TRIG 113 04/20/2016   TRIG 634* 09/25/2009   Lab Results  Component Value Date   CHOLHDL 3.1 04/20/2016   CHOLHDL  6.3 Ratio 09/25/2009   No results found for: LDLDIRECT       Studies: Dg Chest 2 View  04/19/2016  CLINICAL DATA:  Patient with left-sided chest pain. EXAM: CHEST  2 VIEW COMPARISON:  Chest radiograph 02/11/2016. FINDINGS: Anterior cervical spinal fusion hardware. Normal cardiac and mediastinal contours. No consolidative pulmonary opacities. No pleural effusion or pneumothorax. Radiodensities project over the left hemi thorax. Thoracic spine degenerative changes. IMPRESSION: No acute cardiopulmonary process.  Electronically Signed   By: Lovey Newcomer M.D.   On: 04/19/2016 20:15    ASSESSMENT AND PLAN:  1. Chest pain: No evidence for ACS. ECG normal. Markedly hypertensive upon arrival which may be etiology of symptoms. BP normal now. Continue ASA, amlodipine, labetalol, and Crestor. Can fu with Dr. Radford Pax for consideration of outpatient stress test.  2. Essential HTN: Controlled at present. No changes.  3. Bilateral carotid artery stenosis: Due for repeat outpatient Dopplers. Continue ASA and statin.  4. Hyperlipidemia: Continue Crestor.   5. GERD: On Protonix.  Dispo: Can be discharged from my standpoint.   Signed: Kate Sable, M.D., F.A.C.C.  04/20/2016, 12:55 PM

## 2016-04-20 NOTE — Discharge Instructions (Signed)
Chest Wall Pain °Chest wall pain is pain in or around the bones and muscles of your chest. Sometimes, an injury causes this pain. Sometimes, the cause may not be known. This pain may take several weeks or longer to get better. °HOME CARE °Pay attention to any changes in your symptoms. Take these actions to help with your pain: °· Rest as told by your doctor. °· Avoid activities that cause pain. Try not to use your chest, belly (abdominal), or side muscles to lift heavy things. °· If directed, apply ice to the painful area: °¨ Put ice in a plastic bag. °¨ Place a towel between your skin and the bag. °¨ Leave the ice on for 20 minutes, 2-3 times per day. °· Take over-the-counter and prescription medicines only as told by your doctor. °· Do not use tobacco products, including cigarettes, chewing tobacco, and e-cigarettes. If you need help quitting, ask your doctor. °· Keep all follow-up visits as told by your doctor. This is important. °GET HELP IF: °· You have a fever. °· Your chest pain gets worse. °· You have new symptoms. °GET HELP RIGHT AWAY IF: °· You feel sick to your stomach (nauseous) or you throw up (vomit). °· You feel sweaty or light-headed. °· You have a cough with phlegm (sputum) or you cough up blood. °· You are short of breath. °  °This information is not intended to replace advice given to you by your health care provider. Make sure you discuss any questions you have with your health care provider. °  °Document Released: 04/28/2008 Document Revised: 08/01/2015 Document Reviewed: 02/05/2015 °Elsevier Interactive Patient Education ©2016 [employer] ° °

## 2016-04-20 NOTE — Progress Notes (Signed)
Patient arrived to room 3E27 from [facility].  Alert and oriented X 4. Admitted with chest pain. VSS. No complaints of chest pain. No skin break down. SR on telemetry.

## 2016-04-23 ENCOUNTER — Encounter: Payer: Self-pay | Admitting: Family Medicine

## 2016-04-23 ENCOUNTER — Ambulatory Visit (INDEPENDENT_AMBULATORY_CARE_PROVIDER_SITE_OTHER): Payer: Medicare Other | Admitting: Family Medicine

## 2016-04-23 VITALS — BP 134/88 | HR 65 | Temp 97.7°F | Wt 151.0 lb

## 2016-04-23 DIAGNOSIS — K219 Gastro-esophageal reflux disease without esophagitis: Secondary | ICD-10-CM | POA: Diagnosis not present

## 2016-04-23 DIAGNOSIS — I10 Essential (primary) hypertension: Secondary | ICD-10-CM | POA: Diagnosis not present

## 2016-04-23 DIAGNOSIS — I779 Disorder of arteries and arterioles, unspecified: Secondary | ICD-10-CM

## 2016-04-23 DIAGNOSIS — E871 Hypo-osmolality and hyponatremia: Secondary | ICD-10-CM

## 2016-04-23 DIAGNOSIS — Z114 Encounter for screening for human immunodeficiency virus [HIV]: Secondary | ICD-10-CM

## 2016-04-23 DIAGNOSIS — E118 Type 2 diabetes mellitus with unspecified complications: Secondary | ICD-10-CM

## 2016-04-23 DIAGNOSIS — Z794 Long term (current) use of insulin: Secondary | ICD-10-CM

## 2016-04-23 DIAGNOSIS — E785 Hyperlipidemia, unspecified: Secondary | ICD-10-CM

## 2016-04-23 DIAGNOSIS — I739 Peripheral vascular disease, unspecified: Secondary | ICD-10-CM

## 2016-04-23 DIAGNOSIS — D509 Iron deficiency anemia, unspecified: Secondary | ICD-10-CM

## 2016-04-23 DIAGNOSIS — Z1159 Encounter for screening for other viral diseases: Secondary | ICD-10-CM

## 2016-04-23 LAB — CBC
HCT: 35.6 % (ref 35.0–45.0)
Hemoglobin: 10.9 g/dL — ABNORMAL LOW (ref 11.7–15.5)
MCH: 23.3 pg — ABNORMAL LOW (ref 27.0–33.0)
MCHC: 30.6 g/dL — ABNORMAL LOW (ref 32.0–36.0)
MCV: 76.1 fL — AB (ref 80.0–100.0)
MPV: 10 fL (ref 7.5–12.5)
PLATELETS: 416 10*3/uL — AB (ref 140–400)
RBC: 4.68 MIL/uL (ref 3.80–5.10)
RDW: 16.5 % — ABNORMAL HIGH (ref 11.0–15.0)
WBC: 9.3 10*3/uL (ref 3.8–10.8)

## 2016-04-23 LAB — HEPATITIS C ANTIBODY: HCV AB: NEGATIVE

## 2016-04-23 LAB — BASIC METABOLIC PANEL WITH GFR
BUN: 26 mg/dL — ABNORMAL HIGH (ref 7–25)
CALCIUM: 9.3 mg/dL (ref 8.6–10.4)
CO2: 20 mmol/L (ref 20–31)
Chloride: 97 mmol/L — ABNORMAL LOW (ref 98–110)
Creat: 1.35 mg/dL — ABNORMAL HIGH (ref 0.50–1.05)
GFR, EST NON AFRICAN AMERICAN: 43 mL/min — AB (ref >=60)
GFR, Est African American: 50 mL/min — ABNORMAL LOW (ref >=60)
Glucose, Bld: 153 mg/dL — ABNORMAL HIGH (ref 65–99)
Potassium: 4.3 mmol/L (ref 3.5–5.3)
SODIUM: 129 mmol/L — AB (ref 135–146)

## 2016-04-23 LAB — HIV ANTIBODY (ROUTINE TESTING W REFLEX): HIV: NONREACTIVE

## 2016-04-23 LAB — IRON AND TIBC
%SAT: 27 % (ref 11–50)
IRON: 99 ug/dL (ref 45–160)
TIBC: 365 ug/dL (ref 250–450)
UIBC: 266 ug/dL (ref 125–400)

## 2016-04-23 LAB — FERRITIN: FERRITIN: 35 ng/mL (ref 10–232)

## 2016-04-23 NOTE — Patient Instructions (Signed)
It was great seeing you today.   1. For your blood pressure 1. Do not start atenolol 2. Continue lisinopril 20 mg daily, Norvasc 10 mg daily, and clonidine 0.3 mg 3 times a day 2. For your diabetes 1. Take Lantus 30 units every morning 2. Checked your blood sugar every morning prior to eating or taking medications 3. Check your blood sugar 2 hours after dinner 3. All and schedule follow up with your cardiologist for your chest pain (need for stress test), and carotid stenosis 4. Call and schedule follow-up with your gastroenterologist for abdominal pain and need for colonoscopy   Please bring all your medications to every doctors visit  Sign up for My Chart to have easy access to your labs results, and communication with your Primary care physician.  Next Appointment  Please make an appointment with Dr Valentina Lucks in 1 week for blood pressure and smoking  Make an appointment with Dr. Jerline Pain in 1 week for diabetes, brain a long of your blood sugars to that appointment  I look forward to talking with you again at our next visit. If you have any questions or concerns before then, please call the clinic at [phone].  Take Care,   Dr Phill Myron

## 2016-04-23 NOTE — Assessment & Plan Note (Signed)
Continue PPI - Recommended calling to schedule follow-up with gastroenterology for history of GI bleed and need for screening colonoscopy

## 2016-04-23 NOTE — Assessment & Plan Note (Signed)
BP mildly elevated today.  Goal less than 140/90 given diagnosis of diabetes - Recommended not starting atenolol given current heart rate of 65 - Recommended continuing lisinopril 20 mg daily, Norvasc 10 mg daily, and clonidine 0.3 mg 3 times a day; however, it may be beneficial to wean off clonidine and increase lisinopril or add HCTZ as needed - Follow-up with Dr. Valentina Lucks in one week for Hypertension &  Smoking cessation - Check CBC, BMP

## 2016-04-23 NOTE — Assessment & Plan Note (Signed)
Recheck CMP 

## 2016-04-23 NOTE — Progress Notes (Signed)
  Patient name: Alejandra Hernandez MRN [id number]  Date of birth: February 21, 1956  CC & HPI:  Alejandra Hernandez is a 60 y.o. female presenting today for Hospital follow-up.  - She continues to report some intermittent left upper quadrant abdominal pain.  Pain worse with movement.  No radiation to back.  She has not set up appointment with cardiology or gastroenterology.  Does have history of GI bleed.  Was recently seen by her PCP and prescribed atenolol, which she has not started.  She reports wanting to transfer care to [facility] family medicine.  - Denies chest pain, shortness of breath, palpitations, lower surety, swelling - She reports taking Lantus 26 units 1-2 times daily depending on blood sugar; was previously on metformin and tolerated this well however was discontinued when lantus started; she reports occasional hypoglycemic episodes when taking Lantus twice daily  Smoking History Noted  Objective Findings:  Vitals: BP 134/88 mmHg  Pulse 65  Temp(Src) 97.7 F (36.5 C) (Oral)  Wt 151 lb (68.493 kg)  Gen: NAD CV: RRR w/o m/r/g, pulses +2 b/l Resp: CTAB w/ normal respiratory effort GI: No skin changes; BS + x 4 quads; Mild tenderness in LUQ, No CVA tenderness  Assessment & Plan:   Hypertension BP mildly elevated today.  Goal less than 140/90 given diagnosis of diabetes - Recommended not starting atenolol given current heart rate of 65 - Recommended continuing lisinopril 20 mg daily, Norvasc 10 mg daily, and clonidine 0.3 mg 3 times a day; however, it may be beneficial to wean off clonidine and increase lisinopril or add HCTZ as needed - Follow-up with Dr. Valentina Lucks in one week for Hypertension &  Smoking cessation - Check CBC, BMP  Carotid artery disease Advised following up with cardiology.  She reports she will call and schedule the appointment today  GERD (gastroesophageal reflux disease) Continue PPI - Recommended calling to schedule follow-up with gastroenterology for history of GI  bleed and need for screening colonoscopy  Diabetes Change Lantus to 30 units every morning - Recommended checking CBGs every morning fasting and 2 hours postprandial for dinner - Follow with Dr. Jerline Pain in 1-2 weeks - Consider restarting metformin. She has tolerated this well in the past and her creatinine has normalized  Hyponatremia Recheck CMP

## 2016-04-23 NOTE — Assessment & Plan Note (Signed)
Advised following up with cardiology.  She reports she will call and schedule the appointment today

## 2016-04-23 NOTE — Assessment & Plan Note (Signed)
Change Lantus to 30 units every morning - Recommended checking CBGs every morning fasting and 2 hours postprandial for dinner - Follow with Dr. Jerline Pain in 1-2 weeks - Consider restarting metformin. She has tolerated this well in the past and her creatinine has normalized

## 2016-04-30 ENCOUNTER — Other Ambulatory Visit: Payer: Self-pay | Admitting: Family Medicine

## 2016-05-01 ENCOUNTER — Ambulatory Visit: Payer: Medicare Other | Admitting: Pharmacist

## 2016-05-01 ENCOUNTER — Ambulatory Visit (INDEPENDENT_AMBULATORY_CARE_PROVIDER_SITE_OTHER): Payer: Medicare Other | Admitting: Pharmacist

## 2016-05-01 ENCOUNTER — Encounter: Payer: Self-pay | Admitting: Pharmacist

## 2016-05-01 VITALS — BP 177/57 | HR 79 | Wt 152.8 lb

## 2016-05-01 DIAGNOSIS — I10 Essential (primary) hypertension: Secondary | ICD-10-CM | POA: Diagnosis not present

## 2016-05-01 DIAGNOSIS — Z794 Long term (current) use of insulin: Secondary | ICD-10-CM | POA: Diagnosis not present

## 2016-05-01 DIAGNOSIS — F17200 Nicotine dependence, unspecified, uncomplicated: Secondary | ICD-10-CM | POA: Diagnosis not present

## 2016-05-01 DIAGNOSIS — E118 Type 2 diabetes mellitus with unspecified complications: Secondary | ICD-10-CM

## 2016-05-01 MED ORDER — GLUCOSE BLOOD VI STRP: ORAL_STRIP | Status: DC

## 2016-05-01 MED ORDER — SPIRONOLACTONE 25 MG PO TABS: 12.5000 mg | ORAL_TABLET | Freq: Every day | ORAL | Status: DC

## 2016-05-01 MED ORDER — INSULIN ASPART 100 UNIT/ML FLEXPEN: 5.0000 [IU] | PEN_INJECTOR | Freq: Three times a day (TID) | SUBCUTANEOUS | Status: DC

## 2016-05-01 MED ORDER — CLONIDINE HCL 0.2 MG PO TABS: 0.2000 mg | ORAL_TABLET | Freq: Three times a day (TID) | ORAL | Status: DC

## 2016-05-01 NOTE — Progress Notes (Signed)
Patient ID: Alejandra Hernandez, female   DOB: 08/12/1956, 60 y.o.   MRN: [id number] Reviewed: Agree with Dr. Graylin Shiver documentation and management.

## 2016-05-01 NOTE — Patient Instructions (Addendum)
Thank you for coming in today.  Call 1-800-QUIT-NOW and ask if they have nicotine 7 mg patches. Use 1 patches each day.  Start injecting 15 units of Lantus ONCE daily. Increase by 1 unit every 2-3 days if still seeing blood sugar levels in the high 200s or higher. Start injecting 5 units of Novolog three times daily with meals. Continue checking blood sugar 3 times daily.  Start taking Clonidine 0.1 mg- 2 tablets three times daily until finished. Then start new prescription Clonidine 0.2 mg- one tablet three times daily.  Start taking Spironolactone 12.5 mg once daily.  Follow up next Wednesday for nurse visit. Follow-up in pharmacy clinic within 1 month.

## 2016-05-01 NOTE — Assessment & Plan Note (Addendum)
Diabetes longstanding currently uncontrolled. Patient reports hypoglycemic events but most likely due to relative hypoglycemia. Patient denies adherence with medication. She has a lot of stressful events in her life which is affecting her BG. Patient seems to try to correct her high or low BG with inappropriate doses of Lantus either multiple times per day or not at all.  Changed basal insulin Lantus (insulin glargine) to 15 units once daily in the morning. Patient will continue to titrate 1 unit/ 3 days if fasting CBGs > 250mg /dl until fasting CBGs reach 200-250.  Started bolus insulin Novolog (insulin aspart) 5 units TID with meals.

## 2016-05-01 NOTE — Assessment & Plan Note (Signed)
Patient is showed strong willingness to quit smoking. She was instructed to call the quit line and to start using nicotine 7 mg patches once daily.

## 2016-05-01 NOTE — Progress Notes (Signed)
    S:    Patient arrives in fairly good spirits but seems a little agitated and complains of a headache.  Presents for tobacco cessation evaluation, education, and management at the request of Dr. Berkley Harvey. Patient was last seen by Primary Care Provider on 04/23/16.   Patient reports Diabetes and HTN were diagnosed around 2004.  Patient denies adherence with medications. Patient reports that she only takes her Lantus when her sugar is in the high 200s or 300s and occasionally twice daily.   Current diabetes medications include: Lantus 15-20 units only when BG is high (as reported by patient) Current hypertension medications include: Clonidine 0.1 mg TID, Amlodipine 10 mg daily (patient takes it multiple times daily if BP is high), Lisinopril 20 mg daily.  Patient reports hypoglycemic symptoms when sugars drop into the high 100s.  Patient reported limited exercise habits due to foot injury. Patient reports neuropathy. Patient reports visual changes.  Patient reports a lot of stressful events in her life. Seemed fairly anxious during visit.  Patient is willing to work towards quitting smoking. She states that she feels like "she needs" a cigarette every once in a while. She said that she was using 14 mg nicotine patches and they were helping.  O:  Lab Results  Component Value Date   HGBA1C 7.2* 02/11/2016   Filed Vitals:   05/01/16 1456 05/01/16 1529  BP: 188/68 177/57  Pulse: 90 79    Home fasting CBG: 197-246 2 hour post-prandial/random CBG: 234-436.  A/P: Diabetes longstanding currently uncontrolled. Patient reports hypoglycemic events but most likely due to relative hypoglycemia. Patient denies adherence with medication. She has a lot of stressful events in her life which is affecting her BG. Patient seems to try to correct her high or low BG with inappropriate doses of Lantus either multiple times per day or not at all.  Changed basal insulin Lantus (insulin glargine) to 15  units once daily in the morning. Patient will continue to titrate 1 unit/ 3 days if fasting CBGs > 250mg /dl until fasting CBGs reach 200-250.  Started bolus insulin Novolog (insulin aspart) 5 units TID with meals.  Next A1C anticipated 05/13/16.    ASCVD risk greater than 7.5%. Continued Aspirin 81 mg and Continued Rosuvastatin 40 mg.   Hypertension longstanding currently uncontrolled.  Patient reports adherence with medication. Stress is most likely also be driving her BP high as well. Patient is taking extra doses of Amlodipine when BP is high.  Increased dose of Clonidine to 0.2 mg TID. Initiated Spironolactone 12.5 mg once daily.  Patient is showed strong willingness to quit smoking. She was instructed to call the quit line and to start using nicotine 7 mg patches once daily.  Follow-up with [facility] next Wednesday and nurse clinic for BP check. Follow-up with pharmacy clinic within 1 month.  Patient verbalized improvement of headache at the end of the visit.  Written patient instructions provided.  Total time in face to face counseling 60 minutes.   Patient seen with Mikael Spray, PharmD Candidate and Joya San, PharmD Resident.

## 2016-05-01 NOTE — Assessment & Plan Note (Signed)
Hypertension longstanding currently uncontrolled.  Patient reports adherence with medication. Stress is most likely also be driving her BP high as well. Patient is taking extra doses of Amlodipine when BP is high.  Increased dose of Clonidine to 0.2 mg TID. Initiated Spironolactone 12.5 mg once daily.  Headache improved by the end of the visit.  Follow-up with [facility] next Wednesday and nurse clinic for BP check.

## 2016-05-02 ENCOUNTER — Telehealth: Payer: Self-pay | Admitting: *Deleted

## 2016-05-02 NOTE — Telephone Encounter (Signed)
Pt calling stating that the pharmacy said that she cant take the aldactone that you prescribed her with the lisinopril because it will drain the potassium out of her body. Please advise. Deseree Kennon Holter, CMA

## 2016-05-06 NOTE — Telephone Encounter (Signed)
Called and discussed recent blood work and elevated creatinine.  Advised her to keep her appointment tomorrow to recheck her kidney function.  Also discussed her anemia and likely iron deficiency.  Recommended she continue with her iron twice a day and follow-up with PCP as scheduled

## 2016-05-07 ENCOUNTER — Other Ambulatory Visit: Payer: Medicare Other

## 2016-05-07 DIAGNOSIS — I10 Essential (primary) hypertension: Secondary | ICD-10-CM

## 2016-05-08 ENCOUNTER — Ambulatory Visit (INDEPENDENT_AMBULATORY_CARE_PROVIDER_SITE_OTHER): Payer: Medicare Other | Admitting: Physician Assistant

## 2016-05-08 ENCOUNTER — Encounter: Payer: Self-pay | Admitting: Physician Assistant

## 2016-05-08 VITALS — BP 140/58 | HR 62 | Ht 63.0 in | Wt 152.8 lb

## 2016-05-08 DIAGNOSIS — R079 Chest pain, unspecified: Secondary | ICD-10-CM

## 2016-05-08 DIAGNOSIS — E785 Hyperlipidemia, unspecified: Secondary | ICD-10-CM

## 2016-05-08 DIAGNOSIS — I739 Peripheral vascular disease, unspecified: Secondary | ICD-10-CM

## 2016-05-08 DIAGNOSIS — I779 Disorder of arteries and arterioles, unspecified: Secondary | ICD-10-CM

## 2016-05-08 DIAGNOSIS — I119 Hypertensive heart disease without heart failure: Secondary | ICD-10-CM

## 2016-05-08 DIAGNOSIS — R0602 Shortness of breath: Secondary | ICD-10-CM

## 2016-05-08 LAB — BASIC METABOLIC PANEL
BUN: 17 mg/dL (ref 7–25)
CALCIUM: 9.7 mg/dL (ref 8.6–10.4)
CO2: 17 mmol/L — AB (ref 20–31)
CREATININE: 1.25 mg/dL — AB (ref 0.50–1.05)
Chloride: 97 mmol/L — ABNORMAL LOW (ref 98–110)
GLUCOSE: 282 mg/dL — AB (ref 65–99)
Potassium: 5 mmol/L (ref 3.5–5.3)
SODIUM: 130 mmol/L — AB (ref 135–146)

## 2016-05-08 LAB — BRAIN NATRIURETIC PEPTIDE: BRAIN NATRIURETIC PEPTIDE: 16.9 pg/mL (ref <100)

## 2016-05-08 NOTE — Progress Notes (Signed)
Cardiology Office Note:    Date:  05/08/2016   ID:  Alejandra Hernandez, DOB 07-04-1956, MRN [id number]  PCP:  Dimas Chyle, MD  Cardiologist:  Dr. Fransico Him   Electrophysiologist:  n/a  Referring MD: Vivi Barrack, MD   Chief Complaint  Patient presents with  . Hospitalization Follow-up    admx in 5/17 with chest pain    History of Present Illness:     Alejandra Hernandez is a 60 y.o. female with a hx of HTN, HL, diabetes, chest pain, carotid artery disease, GERD, tobacco abuse. Last seen by Dr. Radford Pax 1/16.  She was admitted in 3/17 with symptomatic anemia with a hemoglobin of 6.2 in the setting of peptic ulcer disease. Plavix was held for 7 days.  Admitted 5/17 with chest pain in the setting of uncontrolled hypertension. ECG was unremarkable. Cardiac markers remained negative. She was seen by Dr. Bronson Ing for cardiology. No further inpatient workup was recommended.  She returns for post hospital follow-up.  She is here alone. She continues to have episodes of chest discomfort. This happens with eating. She notes dysphagia. She also notes exertional chest symptoms. She can develop chest symptoms at rest. She notes dyspnea with exertion. She does have difficulty laying flat. She occasionally awaken short of breath. She denies significant cough. She does smoke cigarettes. She continues to have some epigastric pain and her primary care doctor is arranging follow-up with gastroenterology. She denies any melena. She denies syncope. She does note bilateral calf pain with exertion that resolves with rest.  Past Medical History  Diagnosis Date  . Gout   . Chronic back pain   . Anxiety   . Hypertension   . Hyperlipidemia   . AKI (acute kidney injury) ([place]) 01/2016  . GI bleed 01/2016  . Diabetes mellitus     INSULIN DEPEDENT  . Carotid artery disease ([place])     a. Carotid US 7/16: Bilateral ICA 60-79% >> follow-up 1 year  . History of echocardiogram     a. Echo 3/17 mild concentric LVH,  vigorous LVF, EF 65-70%, normal wall motion, grade 1 diastolic dysfunction, mild TR, PASP 31 mmHg  . History of nuclear stress test     a. Myoview 6/15: Low risk, no scar or ischemia, EF 72%    Past Surgical History  Procedure Laterality Date  . Lymph gland excision    . Cervical fusion    . Cesarean section    . Shoulder surgery      left side  . Knee arthroscopy    . Esophagogastroduodenoscopy Left 02/12/2016    Procedure: ESOPHAGOGASTRODUODENOSCOPY (EGD);  Surgeon: Arta Silence, MD;  Location: [facility] ENDOSCOPY;  Service: Endoscopy;  Laterality: Left;    Current Medications: Outpatient Prescriptions Prior to Visit  Medication Sig Dispense Refill  . allopurinol (ZYLOPRIM) 100 MG tablet Take 100 mg by mouth 2 (two) times daily.     Marland Kitchen ALPRAZolam (XANAX) 0.5 MG tablet Take 0.25 mg by mouth daily.     Marland Kitchen amitriptyline (ELAVIL) 25 MG tablet Take 50 mg by mouth at bedtime.  5  . amLODipine (NORVASC) 10 MG tablet Take 10 mg by mouth daily with supper.     . cloNIDine (CATAPRES) 0.2 MG tablet Take 1 tablet (0.2 mg total) by mouth 3 (three) times daily. 270 tablet 3  . glucose blood test strip Use as instructed 100 each 12  . insulin aspart (NOVOLOG FLEXPEN) 100 UNIT/ML FlexPen Inject 5 Units into the skin 3 (three)  times daily with meals. 15 mL 0  . Insulin Glargine (LANTUS SOLOSTAR) 100 UNIT/ML Solostar Pen Inject 15 Units into the skin every morning.    Marland Kitchen lisinopril (PRINIVIL,ZESTRIL) 20 MG tablet Take 20 mg by mouth daily.  0  . nicotine (NICOTROL) 10 MG inhaler Inhale 1 continuous puffing into the lungs every 2 (two) hours as needed for smoking cessation.    . nicotine polacrilex (NICORETTE) 2 MG gum Take 2 mg by mouth every 2 (two) hours as needed for smoking cessation. Reported on 05/01/2016    . NICOTINE STEP 2 14 MG/24HR patch PLACE 1 PATCH ONTO THE SKIN DAILY 28 patch 0  . nitroGLYCERIN (NITROSTAT) 0.4 MG SL tablet Place 1 tablet (0.4 mg total) under the tongue every 5 (five) minutes as  needed for chest pain. 30 tablet 0  . oxyCODONE-acetaminophen (PERCOCET) 10-325 MG tablet Take 1 tablet by mouth every 6 (six) hours as needed for pain.     . pantoprazole (PROTONIX) 40 MG tablet TAKE 1 TABLET(40 MG) BY MOUTH TWICE DAILY BEFORE A MEAL 60 tablet 0  . rosuvastatin (CRESTOR) 40 MG tablet Take 1 tablet (40 mg total) by mouth daily. 90 tablet 3  . aspirin 81 MG chewable tablet Chew 1 tablet (81 mg total) by mouth daily. (Patient not taking: Reported on 05/08/2016) 30 tablet 0  . IRON PO Take 1 tablet by mouth 2 (two) times daily. Reported on 05/08/2016    . spironolactone (ALDACTONE) 25 MG tablet Take 0.5 tablets (12.5 mg total) by mouth daily. (Patient not taking: Reported on 05/08/2016) 30 tablet 0   No facility-administered medications prior to visit.      Allergies:   Review of patient's allergies indicates no known allergies.   Social History   Social History  . Marital Status: Single    Spouse Name: N/A  . Number of Children: N/A  . Years of Education: N/A   Social History Main Topics  . Smoking status: Current Every Day Smoker -- 0.50 packs/day for 40 years    Types: Cigarettes  . Smokeless tobacco: Former Systems developer    Types: Snuff, Chew    Quit date: 11/09/1966  . Alcohol Use: No  . Drug Use: No  . Sexual Activity: Not Currently   Other Topics Concern  . None   Social History Narrative     Family History:  The patient's family history includes Diabetes in her mother and sister; Heart disease in her mother; Kidney failure in her mother.   ROS:   Please see the history of present illness.    Review of Systems  HENT: Positive for headaches.   Eyes: Positive for visual disturbance.  Cardiovascular: Positive for chest pain and dyspnea on exertion.  Hematologic/Lymphatic: Bruises/bleeds easily.  Skin: Positive for rash.  Musculoskeletal: Positive for back pain, joint pain, joint swelling and myalgias.  Neurological: Positive for dizziness and loss of balance.    Psychiatric/Behavioral: The patient is nervous/anxious.    All other systems reviewed and are negative.   Physical Exam:    VS:  BP 140/58 mmHg  Pulse 62  Ht 5\' 3"  (1.6 m)  Wt 152 lb 12.8 oz (69.31 kg)  BMI 27.07 kg/m2  SpO2 99%   Physical Exam  Constitutional: She is oriented to person, place, and time. She appears well-developed and well-nourished.  HENT:  Head: Normocephalic.  Neck: Normal range of motion. No JVD present.  Cardiovascular: Normal rate, regular rhythm, S1 normal, S2 normal and normal heart sounds.  No murmur heard. Pulses:      Femoral pulses are on the left side with bruit.      Dorsalis pedis pulses are 1+ on the right side, and 1+ on the left side.       Posterior tibial pulses are 1+ on the right side, and 1+ on the left side.  Distal pulses diminished    Pulmonary/Chest: She has decreased breath sounds. She has no wheezes. She has no rales.  Abdominal: Soft. She exhibits no mass. There is no tenderness.  Musculoskeletal: Normal range of motion. She exhibits no edema.  Neurological: She is alert and oriented to person, place, and time.  Skin: Skin is warm and dry.  Psychiatric: She has a normal mood and affect.    Wt Readings from Last 3 Encounters:  05/08/16 152 lb 12.8 oz (69.31 kg)  05/01/16 152 lb 12.8 oz (69.31 kg)  04/23/16 151 lb (68.493 kg)      Studies/Labs Reviewed:     EKG:  EKG is  ordered today.  The ekg ordered today demonstrates NSR, HR 62, normal axis, septal Q waves, QTc 391 ms, no change from prior tracing  Recent Labs: 04/19/2016: ALT 28 04/20/2016: Magnesium 1.4*; TSH 1.251 04/23/2016: BUN 26*; Creat 1.35*; Hemoglobin 10.9*; Platelets 416*; Potassium 4.3; Sodium 129*   Recent Lipid Panel    Component Value Date/Time   CHOL 265* 04/20/2016 0100   TRIG 113 04/20/2016 0100   HDL 85 04/20/2016 0100   CHOLHDL 3.1 04/20/2016 0100   VLDL 23 04/20/2016 0100   LDLCALC 157* 04/20/2016 0100    Additional studies/ records that  were reviewed today include:   Echo 02/12/16 - Left ventricle: The cavity size was normal. There was mild   concentric hypertrophy. Systolic function was vigorous. The   estimated ejection fraction was in the range of 65% to 70%. Wall   motion was normal; there were no regional wall motion   abnormalities. Doppler parameters are consistent with abnormal   left ventricular relaxation (grade 1 diastolic dysfunction).   There was no evidence of elevated ventricular filling pressure by   Doppler parameters. - Aortic valve: There was no regurgitation. - Aortic root: The aortic root was normal in size. - Mitral valve: Structurally normal valve. There was no   regurgitation. - Right ventricle: The cavity size was normal. Wall thickness was   normal. Systolic function was normal. - Right atrium: The atrium was normal in size. - Tricuspid valve: There was mild regurgitation. - Pulmonary arteries: Systolic pressure was within the normal   range. PA peak pressure: 31 mm Hg (S). - Inferior vena cava: The vessel was normal in size. - Pericardium, extracardiac: There was no pericardial effusion.  Carotid US 7/16 Bilateral ICA 60-79% >> follow-up 1 year  Myoview 6/15 Overall Impression: Normal stress nuclear study. This is a low risk scan. There is no scar or ischemia.  LV Ejection Fraction: 72%. LV Wall Motion: Normal Wall Motion.   ASSESSMENT:     1. Chest pain, unspecified chest pain type   2. Hypertensive heart disease without CHF   3. Hyperlipidemia   4. Bilateral carotid artery disease ([place])   5. Claudication ([place])   6. SOB (shortness of breath)     PLAN:     In order of problems listed above:  1. Chest pain - Recent admit to [facility] with chest pain and neg workup for ischemia.  She has not undergone stress testing since 2015.  She has a  hx of carotid artery disease and diabetes.  She has typical and atypical symptoms.  Her chest pain can be brought on with eating as well as with  exertion.  He ECG is unchanged.  Will arrange stress testing.  With her vascular disease, ongoing smoking and diabetes, would have a low threshold to proceed with cath.  However, she has had recent GI bleeding and is apparently getting referred back to GI.  It sounds like there is a question of recurrent bleeding.  She has CKD as well.  Therefore, it would be ideal if cardiac cath could be avoided at this time.    -  Arrange Lexiscan Myoview.  -  FU with PCP for peptic ulcer disease.   2. HTN Heart disease - Mild LVH and mild diastolic dysfunction on Echo in 3/17.  She does not look volume overloaded on exam.  Obtain BNP. Add Furosemide if BNP significantly elevated (with caution given chronic hyponatremia).  BP near target.  Continue current regimen and FU with PCP.   3. HL -  She is on Crestor 40.  LDL in 5/17 was 157.  With known carotid artery disease, goal LDL for her should be < 70.  If her LDL remains uncontrolled, would consider referral to Lipid Clinic and determine if she is a candidate for PCSK-9 inhibitor Rx.    4. Carotid artery disease - FU duplex due in 7/17.  I will make sure this is scheduled.  Continue ASA, statin.     5. Claudication - She has bilateral calf pain with exertion that resolves with rest.  She has diabetes and continues to smoke.  She has carotid artery disease.  She is high risk for PAD.  Will get ABIs.    6. Dyspnea - Get BNP today as noted.  If significantly elevated, will add low dose Furosemide with close eye on sodium level given chronic hyponatremia.     Medication Adjustments/Labs and Tests Ordered: Current medicines are reviewed at length with the patient today.  Concerns regarding medicines are outlined above.  Medication changes, Labs and Tests ordered today are outlined in the Patient Instructions noted below. Patient Instructions  Medication Instructions:  Your physician recommends that you continue on your current medications as directed. Please refer  to the Current Medication list given to you today. If you need a refill on your cardiac medications before your next appointment, please call your pharmacy. Labwork: BNP TODAY  Testing/Procedures:  Your physician has requested that you have a lexiscan myoview. For further information please visit [email or web address]. Please follow instruction sheet, as given.  Your physician has requested that you have a lower extremity arterial exercise duplex. During this test, exercise and ultrasound are used to evaluate arterial blood flow in the legs. Allow one hour for this exam. There are no restrictions or special instructions.  RECALL IN EPIC Your physician has requested that you have a carotid duplex. This test is an ultrasound of the carotid arteries in your neck. It looks at blood flow through these arteries that supply the brain with blood. Allow one hour for this exam. There are no restrictions or special instructions.  Follow-Up:WITH TURNER IN 3 TO 4 WEEKS  OR WITH Keagon Glascoe ON A DAY SHE IN THE OFFICE Any Other Special Instructions Will Be Listed Below (If Applicable).   Signed, Richardson Dopp, PA-C  05/08/2016 12:41 PM    [facility] [place] [place], [place], [place]  [address] Phone: [phone]; Fax: [phone]     

## 2016-05-08 NOTE — Patient Instructions (Addendum)
Medication Instructions:  Your physician recommends that you continue on your current medications as directed. Please refer to the Current Medication list given to you today. If you need a refill on your cardiac medications before your next appointment, please call your pharmacy. Labwork: BNP TODAY  Testing/Procedures:  Your physician has requested that you have a lexiscan myoview. For further information please visit [email or web address]. Please follow instruction sheet, as given.  Your physician has requested that you have a lower extremity arterial exercise duplex. During this test, exercise and ultrasound are used to evaluate arterial blood flow in the legs. Allow one hour for this exam. There are no restrictions or special instructions.  RECALL IN EPIC Your physician has requested that you have a carotid duplex. This test is an ultrasound of the carotid arteries in your neck. It looks at blood flow through these arteries that supply the brain with blood. Allow one hour for this exam. There are no restrictions or special instructions.  Follow-Up:WITH TURNER IN 3 TO 4 WEEKS  OR WITH WEAVER ON A DAY SHE IN THE OFFICE Any Other Special Instructions Will Be Listed Below (If Applicable).

## 2016-05-09 ENCOUNTER — Encounter: Payer: Self-pay | Admitting: Physician Assistant

## 2016-05-09 NOTE — Telephone Encounter (Signed)
This encounter was created in error - please disregard.

## 2016-05-09 NOTE — Telephone Encounter (Signed)
New message ° ° ° ° °Returning a call to the nurse to get lab results °

## 2016-05-14 ENCOUNTER — Encounter: Payer: Self-pay | Admitting: Family Medicine

## 2016-05-14 ENCOUNTER — Ambulatory Visit: Payer: Medicare Other | Admitting: Family Medicine

## 2016-05-14 ENCOUNTER — Ambulatory Visit (INDEPENDENT_AMBULATORY_CARE_PROVIDER_SITE_OTHER): Payer: Medicare Other | Admitting: Family Medicine

## 2016-05-14 VITALS — BP 148/60 | HR 79 | Temp 97.8°F | Ht 63.0 in | Wt 151.0 lb

## 2016-05-14 DIAGNOSIS — E785 Hyperlipidemia, unspecified: Secondary | ICD-10-CM | POA: Diagnosis not present

## 2016-05-14 DIAGNOSIS — I10 Essential (primary) hypertension: Secondary | ICD-10-CM | POA: Diagnosis not present

## 2016-05-14 DIAGNOSIS — Z794 Long term (current) use of insulin: Secondary | ICD-10-CM

## 2016-05-14 DIAGNOSIS — E118 Type 2 diabetes mellitus with unspecified complications: Secondary | ICD-10-CM

## 2016-05-14 LAB — POCT GLYCOSYLATED HEMOGLOBIN (HGB A1C): Hemoglobin A1C: 9.5

## 2016-05-14 MED ORDER — INSULIN GLARGINE 100 UNIT/ML SOLOSTAR PEN: 20.0000 [IU] | PEN_INJECTOR | Freq: Every day | SUBCUTANEOUS | Status: DC

## 2016-05-14 NOTE — Assessment & Plan Note (Signed)
A1c elevated to 9.5 today. Stressed important of glucose control. Will titrate up lantus by 1 unit every morning until fasting CBGs are less than 200 (would ideally like to aim for ~100, but patient not tolerant of that as of yet). Will continue novolog 1U with meals given that patient reports hypoglycemic symptoms with increased doses. Follow up in 3-4 weeks.

## 2016-05-14 NOTE — Patient Instructions (Signed)
Your a1c today was 9.5. It is very important that we get this under better control.  Please increase your lantus dose by 1 unit each morning that your fasting blood sugar is more than 200. For example, if your blood sugar is more than 200 tomorrow take 21 units. The next morning, if your blood sugar is more than 200 take 22 units.   You can do go all the way up to 80 units of lantus.   We will not make any other changes today.  Please come back to see me in 1 month to check on your sugars, or sooner if you need anything else.  Take care,  Dr Jerline Pain

## 2016-05-14 NOTE — Assessment & Plan Note (Signed)
Well controlled today. Continue current medications 

## 2016-05-14 NOTE — Progress Notes (Signed)
    Subjective:  Alejandra Hernandez is a 60 y.o. female who presents to the [facility] today with a chief complaint of T2DM follow up.   HPI:  T2DM. Patient with longstanding history of poorly controlled DM. Currently taking lantus 20U daily and novolog 1U with meals. She was started on novlog 5U with meals last week, however said that she felt like they were making her drop her sugars too much and cut back to 1U. No polyuria or polydipsia. Occasionally feels like her sugars are getting too low (lightheaded and dizzy), which resolved after eating.   Fasting CBGs in the 200s-300s. Sometimes as high as the 400s. Post prandial CBGs in the 300s-400s. Occasional Currently on lantus 15U daily and novolog 5U with meals.   Hypertension BP Readings from Last 3 Encounters:  05/14/16 148/60  05/08/16 140/58  05/01/16 177/57   Home BP monitoring-Yes Compliant with medications-yes, without side effects ROS-Denies any CP, HA, SOB, blurry vision, LE edema, transient weakness, orthopnea, PND.   HLD Tolerating crestor without side effects.   ROS: Per HPI  PMH: Smoking history reviewed.   Objective:  Physical Exam: BP 148/60 mmHg  Pulse 79  Temp(Src) 97.8 F (36.6 C) (Oral)  Ht 5\' 3"  (1.6 m)  Wt 151 lb (68.493 kg)  BMI 26.76 kg/m2  Gen: NAD, resting comfortably CV: RRR with no murmurs appreciated Pulm: NWOB, CTAB with no crackles, wheezes, or rhonchi GI: Normal bowel sounds present. Soft, Nontender, Nondistended. MSK: no edema, cyanosis, or clubbing noted Skin: warm, dry Neuro: grossly normal, moves all extremities Psych: Normal affect and thought content  Results for orders placed or performed in visit on 05/14/16 (from the past 72 hour(s))  POCT A1C     Status: Abnormal   Collection Time: 05/14/16  2:28 PM  Result Value Ref Range   Hemoglobin A1C 9.5    Assessment/Plan:  Diabetes (HCC) A1c elevated to 9.5 today. Stressed important of glucose control. Will titrate up lantus by 1 unit  every morning until fasting CBGs are less than 200 (would ideally like to aim for ~100, but patient not tolerant of that as of yet). Will continue novolog 1U with meals given that patient reports hypoglycemic symptoms with increased doses. Follow up in 3-4 weeks.   Essential hypertension Well controlled today. Continue current medications.   Hyperlipidemia Continue crestor.   Algis Greenhouse. Jerline Pain, [facility] Resident PGY-2 05/14/2016 3:32 PM

## 2016-05-14 NOTE — Assessment & Plan Note (Signed)
Continue crestor 

## 2016-05-15 NOTE — Telephone Encounter (Signed)
F/U to pharmacy interaction and visit.   Patient states she has NOT yet taken any spironolactone.   She states her blood pressures have been improved however yesterday she reported a "200 and something" when at a doctors's visit.    She states that she is currently taking lisinopril in the AM, Clonidine 0.1 mg in the AM THEN at ~ 3:00PM she takes Clonidine 0.2 mg and finally takes amlodipine in the evening.    Asked to follow up.  She is unable to come back for 6 days due to transportation scheduling.  Asked to schedule for next week.   She agreed for 1 week follow up.    Asked her to bring her home BP readings for review.     Spironolactone has NOT been started at this time.

## 2016-05-19 ENCOUNTER — Other Ambulatory Visit: Payer: Self-pay | Admitting: Physician Assistant

## 2016-05-19 DIAGNOSIS — I6523 Occlusion and stenosis of bilateral carotid arteries: Secondary | ICD-10-CM

## 2016-05-22 ENCOUNTER — Ambulatory Visit (INDEPENDENT_AMBULATORY_CARE_PROVIDER_SITE_OTHER): Payer: Medicare Other | Admitting: Pharmacist

## 2016-05-22 ENCOUNTER — Encounter: Payer: Self-pay | Admitting: Pharmacist

## 2016-05-22 VITALS — BP 135/47 | HR 61 | Wt 151.0 lb

## 2016-05-22 DIAGNOSIS — I10 Essential (primary) hypertension: Secondary | ICD-10-CM

## 2016-05-22 DIAGNOSIS — Z794 Long term (current) use of insulin: Secondary | ICD-10-CM

## 2016-05-22 DIAGNOSIS — E118 Type 2 diabetes mellitus with unspecified complications: Secondary | ICD-10-CM

## 2016-05-22 DIAGNOSIS — F17200 Nicotine dependence, unspecified, uncomplicated: Secondary | ICD-10-CM

## 2016-05-22 LAB — BASIC METABOLIC PANEL
BUN: 20 mg/dL (ref 7–25)
CALCIUM: 9 mg/dL (ref 8.6–10.4)
CO2: 19 mmol/L — ABNORMAL LOW (ref 20–31)
Chloride: 98 mmol/L (ref 98–110)
Creat: 1.27 mg/dL — ABNORMAL HIGH (ref 0.50–1.05)
GLUCOSE: 335 mg/dL — AB (ref 65–99)
POTASSIUM: 4.6 mmol/L (ref 3.5–5.3)
SODIUM: 130 mmol/L — AB (ref 135–146)

## 2016-05-22 NOTE — Progress Notes (Signed)
S:    Patient arrives in good spirits.  Presents for diabetes, HTN, and tobacco cessation follow-up.  Patient reports fair adherence with medications.  Current diabetes medications include: Lantus, Novolog - patient reports she is taking Lantus 26 units daily, and Novolog 2 units in the afternoon Current hypertension medications include: amlodipine (takes 10 mg in the evening), clonidine (takes 0.1 mg in the morning and 0.2 mg mid-afternoon), lisinopril 20 mg (takes in the morning), spironolactone 12.5 mg (takes in the afternoon - started on 05/16/16)  Patient denies hypoglycemic events.    Patient reported dietary habits: Eats 2-3 meals/day Breakfast: bacon, eggs, toast Lunch: Half of a sandwich, chicken noodle soup, apple or applesauce, strawberries, bananas Dinner: meatloaf, chicken, fish, pork, vegetables, potatoes or rice  Snacks: Peanut butter crackers (12-14/day), applesauce, sugar free mints Drinks: diet coke, tea sweetened with sweet and low, water  Patient reported exercise habits: has tried to start walking more (to the mailbox and back). Reports that she just got her diabetic shoes so she can walk more but foot has been bothering her.    Patient reports neuropathy. Patient reports self foot exams.   Patient reports that she has not smoked a cigarette since she left her last appointment on 6/8. She used one nicotine patch and then has stopped using them.  O:  Lab Results  Component Value Date   HGBA1C 9.5 05/14/2016   Filed Vitals:   05/22/16 1353 05/22/16 1413  BP: 172/72 135/47  Pulse: 83 61  Weight: 151 lb (68.493 kg)    Home fasting CBG: 226-525 mg/dL  Home BP: 129-170/59-71  10 year ASCVD risk: 30.2% (smoker), 16.5% (non-smoker)  A/P: Diabetes longstanding currently uncontrolled. Patient denies hypoglycemic events and is able to verbalize appropriate hypoglycemia management plan.  Patient did endorse one episode of blood glucose dropping from the 300s to  150 mg/dL with s/sx of hypoglycemia.  This occurred after she took 5 units of Novolog mid-afternoon (without relation to a meal).  Patient reports fair adherence with medication. Control is suboptimal due to optimization of medications.  Continued basal insulin Lantus (insulin glargine) 26 units daily.  Increased dose of rapid insulin Novolog (insulin aspart) to 4 units with meals.  Patient as unwilling to increase Novolog dose above 4 units.  Advised patient to increase dose of Novolog to 5 units in a few days if blood sugars remain elevated.  Counseled patient on purpose, proper use, and adverse effects of insulin.  Explained the difference between basal and bolus insulin and counseled on the importance of taking Novolog 5 to 15 minutes prior to meals.  Next A1C anticipated in 2-3 months.    ASCVD risk greater than 7.5%. Continued Aspirin 81 mg and Continued rosuvastatin 40 mg.   Blood pressure initially 172/72.  Hypertension controlled upon BP recheck.  Patient reports adherence with medication. Started taking spironolactone on 05/16/16.  Control is suboptimal due to stress and pain.  Ordered BMET.  Will continue current medications at this time.  Advised patient to continue to monitor BP at home and write down pain score when monitoring.    Tobacco Use.  Reports NO smoking since last visit.  Encouraged continued success with cessation.   Written patient instructions provided.  Total time in face to face counseling 40 minutes.   Follow up with Dr. Jerline Pain in 2-3 weeks (patient declined sooner follow up).   Patient seen with Mikael Spray, PharmD Candidate and Elisabeth Most, PharmD Resident.

## 2016-05-22 NOTE — Progress Notes (Signed)
   Subjective:    Patient ID: Alejandra Hernandez, female    DOB: 11/07/1956, 60 y.o.   MRN: [id number]  HPI Reviewed: Agree with Dr. Graylin Shiver documentation and management.    Review of Systems     Objective:   Physical Exam        Assessment & Plan:

## 2016-05-22 NOTE — Patient Instructions (Signed)
Thank you for coming in today!  Start taking Novolog 4 units with your two biggest meals each day.  If your blood sugars continue to be high in a few days, increase Novolog dose to 5 units before meals.  Be sure to take your Novolog 5 to 15 minutes before your meal.    Continue to take Lantus 26 units daily.    Follow up with Dr. Jerline Pain in 2 weeks.

## 2016-05-22 NOTE — Assessment & Plan Note (Signed)
Blood pressure initially 172/72.  Hypertension controlled upon BP recheck.  Patient reports adherence with medication. Started taking spironolactone on 05/16/16.  Control is suboptimal due to stress and pain.  Ordered BMET.  Will continue current medications at this time.  Advised patient to continue to monitor BP at home and write down pain score when monitoring.

## 2016-05-22 NOTE — Assessment & Plan Note (Signed)
Tobacco Use.  Reports NO smoking since last visit.  Encouraged continued success with cessation.

## 2016-05-22 NOTE — Assessment & Plan Note (Signed)
Diabetes longstanding currently uncontrolled. Patient denies hypoglycemic events and is able to verbalize appropriate hypoglycemia management plan.  Patient did endorse one episode of blood glucose dropping from the 300s to 150 mg/dL with s/sx of hypoglycemia.  This occurred after she took 5 units of Novolog mid-afternoon (without relation to a meal).  Patient reports fair adherence with medication. Control is suboptimal due to optimization of medications.  Continued basal insulin Lantus (insulin glargine) 26 units daily.  Increased dose of rapid insulin Novolog (insulin aspart) to 4 units with meals.  Patient as unwilling to increase Novolog dose above 4 units.  Advised patient to increase dose of Novolog to 5 units in a few days if blood sugars remain elevated.  Counseled patient on purpose, proper use, and adverse effects of insulin.  Explained the difference between basal and bolus insulin and counseled on the importance of taking Novolog 5 to 15 minutes prior to meals.  Next A1C anticipated in 2-3 months.

## 2016-05-28 ENCOUNTER — Other Ambulatory Visit: Payer: Self-pay | Admitting: Physician Assistant

## 2016-05-28 DIAGNOSIS — I739 Peripheral vascular disease, unspecified: Secondary | ICD-10-CM

## 2016-05-29 ENCOUNTER — Other Ambulatory Visit: Payer: Self-pay | Admitting: Internal Medicine

## 2016-06-03 ENCOUNTER — Telehealth (HOSPITAL_COMMUNITY): Payer: Self-pay

## 2016-06-03 NOTE — Telephone Encounter (Signed)
Encounter complete. 

## 2016-06-05 ENCOUNTER — Encounter: Payer: Self-pay | Admitting: Physician Assistant

## 2016-06-05 ENCOUNTER — Other Ambulatory Visit: Payer: Self-pay | Admitting: *Deleted

## 2016-06-05 ENCOUNTER — Ambulatory Visit (HOSPITAL_COMMUNITY)
Admission: RE | Admit: 2016-06-05 | Discharge: 2016-06-05 | Disposition: A | Discharge status: Home / Self Care | Payer: Medicare Other | Source: Ambulatory Visit | Attending: Physician Assistant | Admitting: Physician Assistant

## 2016-06-05 ENCOUNTER — Telehealth: Payer: Self-pay | Admitting: *Deleted

## 2016-06-05 DIAGNOSIS — F419 Anxiety disorder, unspecified: Secondary | ICD-10-CM | POA: Diagnosis not present

## 2016-06-05 DIAGNOSIS — R5383 Other fatigue: Secondary | ICD-10-CM | POA: Diagnosis not present

## 2016-06-05 DIAGNOSIS — I708 Atherosclerosis of other arteries: Secondary | ICD-10-CM | POA: Insufficient documentation

## 2016-06-05 DIAGNOSIS — R0609 Other forms of dyspnea: Secondary | ICD-10-CM | POA: Insufficient documentation

## 2016-06-05 DIAGNOSIS — E785 Hyperlipidemia, unspecified: Secondary | ICD-10-CM | POA: Insufficient documentation

## 2016-06-05 DIAGNOSIS — R42 Dizziness and giddiness: Secondary | ICD-10-CM | POA: Insufficient documentation

## 2016-06-05 DIAGNOSIS — Z87891 Personal history of nicotine dependence: Secondary | ICD-10-CM | POA: Diagnosis not present

## 2016-06-05 DIAGNOSIS — I70201 Unspecified atherosclerosis of native arteries of extremities, right leg: Secondary | ICD-10-CM | POA: Insufficient documentation

## 2016-06-05 DIAGNOSIS — R938 Abnormal findings on diagnostic imaging of other specified body structures: Secondary | ICD-10-CM | POA: Insufficient documentation

## 2016-06-05 DIAGNOSIS — I700 Atherosclerosis of aorta: Secondary | ICD-10-CM | POA: Diagnosis not present

## 2016-06-05 DIAGNOSIS — R0602 Shortness of breath: Secondary | ICD-10-CM | POA: Insufficient documentation

## 2016-06-05 DIAGNOSIS — E119 Type 2 diabetes mellitus without complications: Secondary | ICD-10-CM | POA: Insufficient documentation

## 2016-06-05 DIAGNOSIS — I6523 Occlusion and stenosis of bilateral carotid arteries: Secondary | ICD-10-CM | POA: Insufficient documentation

## 2016-06-05 DIAGNOSIS — I739 Peripheral vascular disease, unspecified: Secondary | ICD-10-CM

## 2016-06-05 DIAGNOSIS — R072 Precordial pain: Secondary | ICD-10-CM | POA: Insufficient documentation

## 2016-06-05 DIAGNOSIS — R002 Palpitations: Secondary | ICD-10-CM | POA: Insufficient documentation

## 2016-06-05 DIAGNOSIS — I10 Essential (primary) hypertension: Secondary | ICD-10-CM | POA: Insufficient documentation

## 2016-06-05 DIAGNOSIS — I779 Disorder of arteries and arterioles, unspecified: Secondary | ICD-10-CM | POA: Insufficient documentation

## 2016-06-05 MED ORDER — TECHNETIUM TC 99M TETROFOSMIN IV KIT
30.1000 | PACK | Freq: Once | INTRAVENOUS | Status: AC | PRN
  Administered 2016-06-05: 30.1 via INTRAVENOUS
  Filled 2016-06-05: qty 30

## 2016-06-05 MED ORDER — REGADENOSON 0.4 MG/5ML IV SOLN
0.4000 mg | Freq: Once | INTRAVENOUS | Status: AC
  Administered 2016-06-05: 0.4 mg via INTRAVENOUS

## 2016-06-05 MED ORDER — AMINOPHYLLINE 25 MG/ML IV SOLN
75.0000 mg | Freq: Once | INTRAVENOUS | Status: AC
  Administered 2016-06-05: 75 mg via INTRAVENOUS

## 2016-06-05 MED ORDER — TECHNETIUM TC 99M TETROFOSMIN IV KIT
10.9000 | PACK | Freq: Once | INTRAVENOUS | Status: AC | PRN
  Administered 2016-06-05: 10.9 via INTRAVENOUS
  Filled 2016-06-05: qty 11

## 2016-06-05 NOTE — Telephone Encounter (Signed)
Pt notified of both Carotid and ABI's results an findings today by phone with verbal understanding. Pt agreeable to see either Dr. Fletcher Anon or Dr. Gwenlyn Found. I will have [facility] call pt 7/14 with appt.

## 2016-06-06 ENCOUNTER — Encounter: Payer: Self-pay | Admitting: Physician Assistant

## 2016-06-06 ENCOUNTER — Telehealth: Payer: Self-pay | Admitting: *Deleted

## 2016-06-06 LAB — MYOCARDIAL PERFUSION IMAGING
CHL CUP NUCLEAR SDS: 2
CHL CUP NUCLEAR SSS: 6
CSEPPHR: 103 {beats}/min
LV dias vol: 49 mL (ref 46–106)
LVSYSVOL: 9 mL
Rest HR: 65 {beats}/min
SRS: 4
TID: 0.83

## 2016-06-06 NOTE — Telephone Encounter (Signed)
Pt notified of myoview results by phone with verbal understanding 

## 2016-06-09 ENCOUNTER — Encounter: Payer: Self-pay | Admitting: Physician Assistant

## 2016-06-09 ENCOUNTER — Ambulatory Visit (INDEPENDENT_AMBULATORY_CARE_PROVIDER_SITE_OTHER): Payer: Medicare Other | Admitting: Physician Assistant

## 2016-06-09 VITALS — BP 138/50 | HR 60 | Ht 63.0 in | Wt 154.8 lb

## 2016-06-09 DIAGNOSIS — E785 Hyperlipidemia, unspecified: Secondary | ICD-10-CM | POA: Diagnosis not present

## 2016-06-09 DIAGNOSIS — I779 Disorder of arteries and arterioles, unspecified: Secondary | ICD-10-CM

## 2016-06-09 DIAGNOSIS — R079 Chest pain, unspecified: Secondary | ICD-10-CM | POA: Diagnosis not present

## 2016-06-09 DIAGNOSIS — I739 Peripheral vascular disease, unspecified: Secondary | ICD-10-CM

## 2016-06-09 DIAGNOSIS — I119 Hypertensive heart disease without heart failure: Secondary | ICD-10-CM

## 2016-06-09 MED ORDER — ISOSORBIDE MONONITRATE ER 30 MG PO TB24: 15.0000 mg | ORAL_TABLET | Freq: Every day | ORAL | Status: DC

## 2016-06-09 NOTE — Patient Instructions (Addendum)
Medication Instructions:  1. START IMDUR 30 MG TABLET WITH THE DIRECTIONS TO READ: TAKE 1/2 TABLET = 15 MG DAILY Labwork: NONE Testing/Procedures: NONE Follow-Up: DR. Radford Pax IN 3 MONTHS ; WE WILL SEND OUT A REMINDER LETTER IN ABOUT 2 WEEKS  Any Other Special Instructions Will Be Listed Below (If Applicable). If you need a refill on your cardiac medications before your next appointment, please call your pharmacy.

## 2016-06-09 NOTE — Progress Notes (Signed)
Cardiology Office Note:    Date:  06/09/2016   ID:  TRIXY EVERSON, DOB [birth year], MRN [id number]  PCP:  Dimas Chyle, MD  Cardiologist:  Dr. Fransico Him  Electrophysiologist:  N/a Neurologist: Dr. Trula Ore in [place], [place]  Referring MD: Vivi Barrack, MD   Chief Complaint  Patient presents with  . Follow-up    chest pain, PAD    History of Present Illness:    Alejandra Hernandez is a 60 y.o. female with a hx of HTN, HL, diabetes, chest pain, carotid artery disease, GERD, tobacco abuse. Last seen by Dr. Radford Pax 1/16.  She was admitted in 3/17 with symptomatic anemia with a hemoglobin of 6.2 in the setting of peptic ulcer disease. Plavix was held for 7 days.  Admitted 5/17 with chest pain in the setting of uncontrolled hypertension. ECG was unremarkable. Cardiac markers remained negative. She was seen by Dr. Bronson Ing for cardiology. No further inpatient workup was recommended.  Last seen here in 6/17 for FU.  Stress testing was arranged.   This was low risk and neg for ischemia or scar.  Of note, she c/o claudication at last visit. ABIs were abnormal and she is pending referral to [facility].  Returns for FU.  Here alone.  She continues to have bilateral leg pain. She denies any open sores.  She has had a couple more episodes of chest pain.  These are not related to exertion. She notes DOE that is unchanged.  She denied PND, edema.  She denies syncope.    Past Medical History  Diagnosis Date  . Gout   . Chronic back pain   . Anxiety   . Hypertension   . Hyperlipidemia   . AKI (acute kidney injury) ([place]) 01/2016  . GI bleed 01/2016  . Diabetes mellitus     INSULIN DEPEDENT  . Carotid artery disease ([place])     a. Carotid US 7/16: Bilateral ICA 60-79% >> follow-up 1 year  //  b. Carotid US 7/17: A999333 RICA; [id number] LICA >> Follow-up 1 year  . History of echocardiogram     a. Echo 3/17 mild concentric LVH, vigorous LVF, EF 65-70%, normal wall motion, grade 1 diastolic dysfunction,  mild TR, PASP 31 mmHg  . History of nuclear stress test     a. Myoview 6/15: Low risk, no scar or ischemia, EF 72%  //  b. Myoview 7/17: EF 82%, no scar or ischemia, low risk  . PAD (peripheral artery disease) ([place])     a. ABIs 7/17: R 0.73, L 0.64, waveforms suggest bilateral SFA disease    Past Surgical History  Procedure Laterality Date  . Lymph gland excision    . Cervical fusion    . Cesarean section    . Shoulder surgery      left side  . Knee arthroscopy    . Esophagogastroduodenoscopy Left 02/12/2016    Procedure: ESOPHAGOGASTRODUODENOSCOPY (EGD);  Surgeon: Arta Silence, MD;  Location: [facility] ENDOSCOPY;  Service: Endoscopy;  Laterality: Left;    Current Medications: Outpatient Prescriptions Prior to Visit  Medication Sig Dispense Refill  . allopurinol (ZYLOPRIM) 100 MG tablet Take 100 mg by mouth 2 (two) times daily.     Marland Kitchen ALPRAZolam (XANAX) 0.5 MG tablet Take 0.25 mg by mouth daily.     Marland Kitchen amitriptyline (ELAVIL) 25 MG tablet Take 50 mg by mouth at bedtime.  5  . amLODipine (NORVASC) 10 MG tablet Take 10 mg by mouth daily with supper.     Marland Kitchen  aspirin 81 MG tablet Take 81 mg by mouth daily.    . ferrous sulfate 325 (65 FE) MG tablet Take 325 mg by mouth 2 (two) times daily with a meal.    . glucose blood test strip Use as instructed 100 each 12  . lisinopril (PRINIVIL,ZESTRIL) 20 MG tablet Take 20 mg by mouth daily.  0  . nicotine polacrilex (NICORETTE) 2 MG gum Take 2 mg by mouth every 2 (two) hours as needed for smoking cessation. Reported on 05/22/2016    . nitroGLYCERIN (NITROSTAT) 0.4 MG SL tablet Place 1 tablet (0.4 mg total) under the tongue every 5 (five) minutes as needed for chest pain. 30 tablet 0  . oxyCODONE-acetaminophen (PERCOCET) 10-325 MG tablet Take 1 tablet by mouth every 6 (six) hours as needed for pain.     . pantoprazole (PROTONIX) 40 MG tablet TAKE 1 TABLET(40 MG) BY MOUTH TWICE DAILY BEFORE A MEAL 60 tablet 0  . rosuvastatin (CRESTOR) 40 MG tablet Take 1  tablet (40 mg total) by mouth daily. 90 tablet 3  . spironolactone (ALDACTONE) 12.5 mg TABS tablet Take 12.5 mg by mouth daily.    . cloNIDine (CATAPRES) 0.2 MG tablet Take 1 tablet (0.2 mg total) by mouth 3 (three) times daily. (Patient not taking: Reported on 06/09/2016) 270 tablet 3  . insulin aspart (NOVOLOG FLEXPEN) 100 UNIT/ML FlexPen Inject 5 Units into the skin 3 (three) times daily with meals. (Patient not taking: Reported on 06/09/2016) 15 mL 0  . Insulin Glargine (LANTUS SOLOSTAR) 100 UNIT/ML Solostar Pen Inject 20 Units into the skin daily. (Patient not taking: Reported on 06/09/2016) 5 pen 5  . nicotine (NICOTROL) 10 MG inhaler Inhale 1 continuous puffing into the lungs every 2 (two) hours as needed for smoking cessation. Reported on 06/09/2016    . NICOTINE STEP 2 14 MG/24HR patch PLACE 1 PATCH ONTO THE SKIN DAILY (Patient not taking: Reported on 06/09/2016) 28 patch 0   No facility-administered medications prior to visit.      Allergies:   Review of patient's allergies indicates no known allergies.   Social History   Social History  . Marital Status: Single    Spouse Name: N/A  . Number of Children: N/A  . Years of Education: N/A   Social History Main Topics  . Smoking status: Former Smoker -- 0.50 packs/day for 40 years    Types: Cigarettes    Quit date: 05/10/2016  . Smokeless tobacco: Former Systems developer    Types: Snuff, Sarina Ser    Quit date: 11/09/1966     Comment: using the patch  . Alcohol Use: No  . Drug Use: No  . Sexual Activity: Not Currently   Other Topics Concern  . None   Social History Narrative     Family History:  The patient's family history includes Diabetes in her mother and sister; Heart disease in her mother; Kidney failure in her mother.   ROS:   Please see the history of present illness.    Review of Systems  HENT: Positive for headaches.   Eyes: Positive for visual disturbance.  Respiratory: Positive for cough.   Musculoskeletal: Positive for  joint pain.  Gastrointestinal: Positive for abdominal pain.  Neurological: Positive for loss of balance.  Psychiatric/Behavioral: The patient is nervous/anxious.    All other systems reviewed and are negative.   Physical Exam:    VS:  BP 138/50 mmHg  Pulse 60  Ht 5\' 3"  (1.6 m)  Wt 154 lb 12.8 oz (  70.217 kg)  BMI 27.43 kg/m2    Wt Readings from Last 3 Encounters:  06/09/16 154 lb 12.8 oz (70.217 kg)  06/05/16 151 lb (68.493 kg)  05/22/16 151 lb (68.493 kg)     Physical Exam  Constitutional: She is oriented to person, place, and time. She appears well-developed and well-nourished. No distress.  HENT:  Head: Normocephalic and atraumatic.  Neck: No JVD present.  Cardiovascular: Normal rate, regular rhythm and normal heart sounds.   No murmur heard. Pulmonary/Chest: Effort normal and breath sounds normal. She has no wheezes. She has no rales.  Abdominal: There is no tenderness.  Musculoskeletal: She exhibits no edema.  Neurological: She is alert and oriented to person, place, and time.  Skin: Skin is warm and dry.  Psychiatric: She has a normal mood and affect.    Studies/Labs Reviewed:    EKG:  EKG is   ordered today.  The ekg ordered today demonstrates Sinus bradycardia, HR 59, normal axis, septal Q waves, QTc 386 ms  Recent Labs: 04/19/2016: ALT 28 04/20/2016: Magnesium 1.4*; TSH 1.251 04/23/2016: Hemoglobin 10.9*; Platelets 416* 05/08/2016: Brain Natriuretic Peptide 16.9 05/22/2016: BUN 20; Creat 1.27*; Potassium 4.6; Sodium 130*   Recent Lipid Panel    Component Value Date/Time   CHOL 265* 04/20/2016 0100   TRIG 113 04/20/2016 0100   HDL 85 04/20/2016 0100   CHOLHDL 3.1 04/20/2016 0100   VLDL 23 04/20/2016 0100   LDLCALC 157* 04/20/2016 0100    Additional studies/ records that were reviewed today include:   Carotid US 7/17:  [id number] RICA; [id number] LICA >> Follow-up 1 year  Nuclear Stress Test 06/05/16 EF 82%, no scar or ischemia, low risk  Echo 02/12/16 Mild  concentric LVH, vigorous LVEF, EF 65-70%, normal wall motion, grade 1 diastolic dysfunction, mild TR, PASP 31 mmHg  Carotid US 7/16 Bilateral ICA 60-79% >> follow-up 1 year  Myoview 6/15 Overall Impression: Normal stress nuclear study. This is a low risk scan. There is no scar or ischemia. LV Ejection Fraction: 72%. LV Wall Motion: Normal Wall Motion.  ASSESSMENT:    1. Chest pain, unspecified chest pain type   2. Hypertensive heart disease without CHF   3. Hyperlipidemia   4. Bilateral carotid artery disease ([place])   5. PAD (peripheral artery disease) (H[facility])    PLAN:    In order of problems listed above:  1. Chest pain - Recent admit to [facility] with chest pain and neg workup for ischemia.  Recent nuclear stress test low risk and neg for ischemia.  She continues to have occ chest discomfort that is largely asymptomatic. Given her vascular disease, she is at risk for CAD.  She thinks that she has had some relief with NTG in the past.  I will try to advance medical Rx.  If she has worsening chest symptoms over time, will have a low threshold to proceed to cardiac cath.  -  Start Imdur 15 mg QD  -  Continue amlodipine, aspirin, Crestor   2. HTN Heart disease - Mild LVH and mild diastolic dysfunction on Echo in 3/17. Blood pressures better controlled.   3. HL -  She is on Crestor 40.  LDL in 5/17 was 157.  With known carotid artery disease, goal LDL for her should be < 70.  If her LDL remains uncontrolled, would consider referral to Lipid Clinic and determine if she is a candidate for PCSK-9 inhibitor Rx.    4. Carotid artery disease - FU duplex with stable  mod bilat plaque.  Continue ASA, statin.     5. PAD - Recent ABIs abnormal with suggestion of bilateral SFA disease.  FU with Dr. Fletcher Anon next month.  She has quit smoking.    Medication Adjustments/Labs and Tests Ordered: Current medicines are reviewed at length with the patient today.  Concerns regarding medicines are outlined above.   Medication changes, Labs and Tests ordered today are outlined in the Patient Instructions noted below. Patient Instructions  Medication Instructions:  1. START IMDUR 30 MG TABLET WITH THE DIRECTIONS TO READ: TAKE 1/2 TABLET = 15 MG DAILY Labwork: NONE Testing/Procedures: NONE Follow-Up: DR. Radford Pax IN 3 MONTHS ; WE WILL SEND OUT A REMINDER LETTER IN ABOUT 2 WEEKS  Any Other Special Instructions Will Be Listed Below (If Applicable). If you need a refill on your cardiac medications before your next appointment, please call your pharmacy.   Signed, Richardson Dopp, PA-C  06/09/2016 2:15 PM    [facility] [address] [place], [place],   [address] Phone: [phone]; Fax: [phone]

## 2016-06-12 ENCOUNTER — Other Ambulatory Visit: Payer: Self-pay | Admitting: Internal Medicine

## 2016-06-12 DIAGNOSIS — Z1231 Encounter for screening mammogram for malignant neoplasm of breast: Secondary | ICD-10-CM

## 2016-06-26 ENCOUNTER — Other Ambulatory Visit: Payer: Self-pay | Admitting: Family Medicine

## 2016-06-26 NOTE — Telephone Encounter (Signed)
Rx filled.  Algis Greenhouse. Jerline Pain, [place] Resident PGY-3 06/26/2016 10:50 AM

## 2016-07-15 ENCOUNTER — Encounter: Payer: Self-pay | Admitting: Cardiovascular Disease

## 2016-07-15 ENCOUNTER — Ambulatory Visit (INDEPENDENT_AMBULATORY_CARE_PROVIDER_SITE_OTHER): Payer: Medicare Other | Admitting: Cardiovascular Disease

## 2016-07-15 VITALS — BP 122/68 | HR 78 | Ht 63.0 in | Wt 158.0 lb

## 2016-07-15 DIAGNOSIS — E785 Hyperlipidemia, unspecified: Secondary | ICD-10-CM | POA: Diagnosis not present

## 2016-07-15 DIAGNOSIS — I10 Essential (primary) hypertension: Secondary | ICD-10-CM

## 2016-07-15 DIAGNOSIS — I739 Peripheral vascular disease, unspecified: Secondary | ICD-10-CM | POA: Diagnosis not present

## 2016-07-15 DIAGNOSIS — Z720 Tobacco use: Secondary | ICD-10-CM

## 2016-07-15 NOTE — Patient Instructions (Addendum)
Medication Instructions:  Your physician recommends that you continue on your current medications as directed. Please refer to the Current Medication list given to you today.  Labwork: No new orders.   Testing/Procedures: Your physician has requested that you have a peripheral vascular angiogram. This exam is performed at the hospital. During this exam IV contrast is used to look at arterial blood flow. Please review the information sheet given for details.  Follow-Up: Your physician recommends that you schedule a follow-up appointment in: 3 WEEKS with Dr Fletcher Anon    Any Other Special Instructions Will Be Listed Below (If Applicable).     If you need a refill on your cardiac medications before your next appointment, please call your pharmacy.

## 2016-07-15 NOTE — Progress Notes (Signed)
Cardiology Office Note   Date:  07/15/2016   ID:  Alejandra Hernandez, Alejandra Hernandez 11/24/1956, MRN [id number]  PCP:  Dimas Chyle, MD  Cardiologist:  Dr. Radford Pax  Chief Complaint  Patient presents with  . New Patient (Initial Visit)    PV consult      History of Present Illness: Alejandra Hernandez is a 60 y.o. female who was referred by Richardson Dopp for evaluation and management of peripheral arterial disease. She has known history of hypertension, hyperlipidemia, diabetes mellitus, carotid disease, GERD and tobacco use. She was hospitalized in March 2017 for symptomatic anemia with a hemoglobin of 6.2 in the setting of peptic ulcer disease. She was evaluated with a stress test in June for atypical chest pain. The stress test was normal. She is referred for claudication and abnormal Doppler study. She is known to have bilateral carotid disease. Carotid Doppler in July showed 60-79% right ICA stenosis and 40-59% left ICA stenosis. Noninvasive vascular studies showed an ABI of 0.73 on the right and 0.64 on the left. Duplex showed moderate iliac disease on the right, moderate right common femoral artery stenosis, significant right SFA stenosis and borderline significant left SFA stenosis. She reports bilateral calf pain after walking about half a block. The symptoms are worse on the right side than the left side. She also started having right thigh pain with walking recently. She suffers from left foot tendinitis and fasciitis. She also noted dark discoloration at the base of the left big toe. She reports having an infection affecting the big toenail with plans to remove that by Dr. Barkley Bruns.   Past Medical History:  Diagnosis Date  . AKI (acute kidney injury) ([place]) 01/2016  . Anxiety   . Carotid artery disease ([place])    a. Carotid US 7/16: Bilateral ICA 60-79% >> follow-up 1 year  //  b. Carotid US 7/17: A999333 RICA; 123456 LICA >> Follow-up 1 year  . Chronic back pain   . Diabetes mellitus    INSULIN  DEPEDENT  . GI bleed 01/2016  . Gout   . History of echocardiogram    a. Echo 3/17 mild concentric LVH, vigorous LVF, EF 65-70%, normal wall motion, grade 1 diastolic dysfunction, mild TR, PASP 31 mmHg  . History of nuclear stress test    a. Myoview 6/15: Low risk, no scar or ischemia, EF 72%  //  b. Myoview 7/17: EF 82%, no scar or ischemia, low risk  . Hyperlipidemia   . Hypertension   . PAD (peripheral artery disease) ([place])    a. ABIs 7/17: R 0.73, L 0.64, waveforms suggest bilateral SFA disease    Past Surgical History:  Procedure Laterality Date  . CERVICAL FUSION    . CESAREAN SECTION    . ESOPHAGOGASTRODUODENOSCOPY Left 02/12/2016   Procedure: ESOPHAGOGASTRODUODENOSCOPY (EGD);  Surgeon: Arta Silence, MD;  Location: [facility] ENDOSCOPY;  Service: Endoscopy;  Laterality: Left;  . KNEE ARTHROSCOPY    . LYMPH GLAND EXCISION    . SHOULDER SURGERY     left side     Current Outpatient Prescriptions  Medication Sig Dispense Refill  . allopurinol (ZYLOPRIM) 100 MG tablet Take 100 mg by mouth 2 (two) times daily.     Marland Kitchen ALPRAZolam (XANAX) 0.5 MG tablet Take 0.25 mg by mouth daily.     Marland Kitchen amitriptyline (ELAVIL) 25 MG tablet Take 50 mg by mouth at bedtime.  5  . amLODipine (NORVASC) 10 MG tablet Take 10 mg by mouth daily with supper.     Marland Kitchen  aspirin 81 MG tablet Take 81 mg by mouth daily.    . cloNIDine (CATAPRES) 0.2 MG tablet Take 0.2 mg by mouth daily.    . ferrous sulfate 325 (65 FE) MG tablet Take 325 mg by mouth 2 (two) times daily with a meal.    . glucose blood test strip Use as instructed 100 each 12  . insulin aspart (NOVOLOG FLEXPEN) 100 UNIT/ML FlexPen Inject 2 Units into the skin 2 (two) times daily.    . Insulin Glargine (LANTUS SOLOSTAR) 100 UNIT/ML Solostar Pen Inject 26 Units into the skin 2 (two) times daily.    . isosorbide mononitrate (IMDUR) 30 MG 24 hr tablet Take 0.5 tablets (15 mg total) by mouth daily. 30 tablet 6  . lisinopril (PRINIVIL,ZESTRIL) 20 MG tablet Take 20  mg by mouth daily.  0  . nitroGLYCERIN (NITROSTAT) 0.4 MG SL tablet Place 1 tablet (0.4 mg total) under the tongue every 5 (five) minutes as needed for chest pain. 30 tablet 0  . oxyCODONE-acetaminophen (PERCOCET) 10-325 MG tablet Take 1 tablet by mouth every 6 (six) hours as needed for pain.     . pantoprazole (PROTONIX) 40 MG tablet TAKE 1 TABLET BY MOUTH TWICE DAILY BEFORE A MEAL 60 tablet 5  . rosuvastatin (CRESTOR) 40 MG tablet Take 1 tablet (40 mg total) by mouth daily. 90 tablet 3  . spironolactone (ALDACTONE) 12.5 mg TABS tablet Take 12.5 mg by mouth daily.     No current facility-administered medications for this visit.     Allergies:   Review of patient's allergies indicates no known allergies.    Social History:  The patient  reports that she quit smoking about 2 months ago. Her smoking use included Cigarettes. She has a 20.00 pack-year smoking history. She quit smokeless tobacco use about 49 years ago. Her smokeless tobacco use included Snuff and Chew. She reports that she does not drink alcohol or use drugs.   Family History:  The patient's family history includes Diabetes in her mother and sister; Heart disease in her mother; Kidney failure in her mother.    ROS:  Please see the history of present illness.   Otherwise, review of systems are positive for none.   All other systems are reviewed and negative.    PHYSICAL EXAM: VS:  BP 122/68   Pulse 78   Ht 5\' 3"  (1.6 m)   Wt 158 lb (71.7 kg)   BMI 27.99 kg/m  , BMI Body mass index is 27.99 kg/m. GEN: Well nourished, well developed, in no acute distress  HEENT: normal  Neck: no JVD, carotid bruits, or masses Cardiac: RRR; no murmurs, rubs, or gallops,no edema  Respiratory:  clear to auscultation bilaterally, normal work of breathing GI: soft, nontender, nondistended, + BS MS: no deformity or atrophy  Skin: warm and dry, no rash Neuro:  Strength and sensation are intact Psych: euthymic mood, full affect Vascular:  Femoral pulses +1 on the right and +2 on the left. Distal pulses are not palpable. There is dark discoloration at the base of the left big toe with dystrophic nail.  EKG:  EKG is not ordered today.    Recent Labs: 04/19/2016: ALT 28 04/20/2016: Magnesium 1.4; TSH 1.251 04/23/2016: Hemoglobin 10.9; Platelets 416 05/08/2016: Brain Natriuretic Peptide 16.9 05/22/2016: BUN 20; Creat 1.27; Potassium 4.6; Sodium 130    Lipid Panel    Component Value Date/Time   CHOL 265 (H) 04/20/2016 0100   TRIG 113 04/20/2016 0100   HDL 85  04/20/2016 0100   CHOLHDL 3.1 04/20/2016 0100   VLDL 23 04/20/2016 0100   LDLCALC 157 (H) 04/20/2016 0100      Wt Readings from Last 3 Encounters:  07/15/16 158 lb (71.7 kg)  06/09/16 154 lb 12.8 oz (70.2 kg)  06/05/16 151 lb (68.5 kg)        ASSESSMENT AND PLAN:  1.  Peripheral arterial disease:Severe bilateral leg claudication worse on the right side. However, there might have been an embolic event to the left big toe. There are also plans to remove the big toenail and thus I think we should focus on the left leg initially although her symptoms are worse on the right. Given severity of her symptoms, I recommend proceeding with abdominal aortogram with lower extremity runoff and possible endovascular intervention with focus on the left leg. She does have mild chronic kidney disease and thus would bring her early for hydration. I discussed the procedure in details as well as risk and benefits.  2. Bilateral carotid disease: Continue treatment of risk factors and repeat Doppler in 1 year  3. Hyperlipidemia: Continue high-dose rosuvastatin with a target LDL of less than 70.  4. Tobacco use: I discussed with her the importance of smoking cessation.  Disposition:   FU with me in 1 month  Signed,  Kathlyn Sacramento, MD  07/15/2016 5:40 PM    [place]

## 2016-07-22 ENCOUNTER — Ambulatory Visit
Admission: RE | Admit: 2016-07-22 | Discharge: 2016-07-22 | Disposition: A | Discharge status: Home / Self Care | Payer: Medicare Other | Source: Ambulatory Visit | Attending: Internal Medicine | Admitting: Internal Medicine

## 2016-07-22 DIAGNOSIS — Z1231 Encounter for screening mammogram for malignant neoplasm of breast: Secondary | ICD-10-CM

## 2016-07-23 ENCOUNTER — Ambulatory Visit (HOSPITAL_COMMUNITY)
Admission: RE | Admit: 2016-07-23 | Discharge: 2016-07-23 | Disposition: A | Discharge status: Home / Self Care | Payer: Medicare Other | Source: Ambulatory Visit | Attending: Cardiovascular Disease | Admitting: Cardiovascular Disease

## 2016-07-23 ENCOUNTER — Encounter (HOSPITAL_COMMUNITY)
Admission: RE | Disposition: A | Discharge status: Home / Self Care | Payer: Self-pay | Source: Ambulatory Visit | Attending: Cardiovascular Disease

## 2016-07-23 DIAGNOSIS — E785 Hyperlipidemia, unspecified: Secondary | ICD-10-CM | POA: Insufficient documentation

## 2016-07-23 DIAGNOSIS — I70213 Atherosclerosis of native arteries of extremities with intermittent claudication, bilateral legs: Secondary | ICD-10-CM | POA: Insufficient documentation

## 2016-07-23 DIAGNOSIS — I739 Peripheral vascular disease, unspecified: Secondary | ICD-10-CM | POA: Diagnosis present

## 2016-07-23 DIAGNOSIS — I129 Hypertensive chronic kidney disease with stage 1 through stage 4 chronic kidney disease, or unspecified chronic kidney disease: Secondary | ICD-10-CM | POA: Insufficient documentation

## 2016-07-23 DIAGNOSIS — Z794 Long term (current) use of insulin: Secondary | ICD-10-CM | POA: Insufficient documentation

## 2016-07-23 DIAGNOSIS — I700 Atherosclerosis of aorta: Secondary | ICD-10-CM | POA: Diagnosis not present

## 2016-07-23 DIAGNOSIS — E1122 Type 2 diabetes mellitus with diabetic chronic kidney disease: Secondary | ICD-10-CM | POA: Diagnosis not present

## 2016-07-23 DIAGNOSIS — Z7982 Long term (current) use of aspirin: Secondary | ICD-10-CM | POA: Insufficient documentation

## 2016-07-23 DIAGNOSIS — M549 Dorsalgia, unspecified: Secondary | ICD-10-CM | POA: Diagnosis not present

## 2016-07-23 DIAGNOSIS — N182 Chronic kidney disease, stage 2 (mild): Secondary | ICD-10-CM | POA: Insufficient documentation

## 2016-07-23 DIAGNOSIS — F17210 Nicotine dependence, cigarettes, uncomplicated: Secondary | ICD-10-CM | POA: Diagnosis not present

## 2016-07-23 DIAGNOSIS — I701 Atherosclerosis of renal artery: Secondary | ICD-10-CM | POA: Diagnosis not present

## 2016-07-23 DIAGNOSIS — Z8711 Personal history of peptic ulcer disease: Secondary | ICD-10-CM | POA: Insufficient documentation

## 2016-07-23 DIAGNOSIS — K219 Gastro-esophageal reflux disease without esophagitis: Secondary | ICD-10-CM | POA: Diagnosis not present

## 2016-07-23 DIAGNOSIS — E1151 Type 2 diabetes mellitus with diabetic peripheral angiopathy without gangrene: Secondary | ICD-10-CM | POA: Insufficient documentation

## 2016-07-23 DIAGNOSIS — M109 Gout, unspecified: Secondary | ICD-10-CM | POA: Insufficient documentation

## 2016-07-23 DIAGNOSIS — G8929 Other chronic pain: Secondary | ICD-10-CM | POA: Diagnosis not present

## 2016-07-23 DIAGNOSIS — F419 Anxiety disorder, unspecified: Secondary | ICD-10-CM | POA: Insufficient documentation

## 2016-07-23 DIAGNOSIS — D649 Anemia, unspecified: Secondary | ICD-10-CM | POA: Insufficient documentation

## 2016-07-23 HISTORY — PX: PERIPHERAL VASCULAR CATHETERIZATION: SHX172C

## 2016-07-23 LAB — CBC
HEMATOCRIT: 35.1 % — AB (ref 36.0–46.0)
HEMOGLOBIN: 11 g/dL — AB (ref 12.0–15.0)
MCH: 22.2 pg — ABNORMAL LOW (ref 26.0–34.0)
MCHC: 31.3 g/dL (ref 30.0–36.0)
MCV: 70.8 fL — AB (ref 78.0–100.0)
Platelets: 338 10*3/uL (ref 150–400)
RBC: 4.96 MIL/uL (ref 3.87–5.11)
RDW: 16.9 % — ABNORMAL HIGH (ref 11.5–15.5)
WBC: 8 10*3/uL (ref 4.0–10.5)

## 2016-07-23 LAB — BASIC METABOLIC PANEL
ANION GAP: 9 (ref 5–15)
BUN: 12 mg/dL (ref 6–20)
CO2: 24 mmol/L (ref 22–32)
Calcium: 9.5 mg/dL (ref 8.9–10.3)
Chloride: 100 mmol/L — ABNORMAL LOW (ref 101–111)
Creatinine, Ser: 1.18 mg/dL — ABNORMAL HIGH (ref 0.44–1.00)
GFR, EST AFRICAN AMERICAN: 57 mL/min — AB (ref >60)
GFR, EST NON AFRICAN AMERICAN: 49 mL/min — AB (ref >60)
GLUCOSE: 215 mg/dL — AB (ref 65–99)
POTASSIUM: 4.6 mmol/L (ref 3.5–5.1)
Sodium: 133 mmol/L — ABNORMAL LOW (ref 135–145)

## 2016-07-23 LAB — PROTIME-INR
INR: 1.04
Prothrombin Time: 13.6 seconds (ref 11.4–15.2)

## 2016-07-23 LAB — GLUCOSE, CAPILLARY
GLUCOSE-CAPILLARY: 202 mg/dL — AB (ref 65–99)
GLUCOSE-CAPILLARY: 178 mg/dL — AB (ref 65–99)
GLUCOSE-CAPILLARY: 157 mg/dL — AB (ref 65–99)
Glucose-Capillary: 165 mg/dL — ABNORMAL HIGH (ref 65–99)

## 2016-07-23 SURGERY — ABDOMINAL AORTOGRAM W/LOWER EXTREMITY

## 2016-07-23 MED ORDER — HEPARIN (PORCINE) IN NACL 2-0.9 UNIT/ML-% IJ SOLN
INTRAMUSCULAR | Status: AC
  Filled 2016-07-23: qty 1000

## 2016-07-23 MED ORDER — MIDAZOLAM HCL 2 MG/2ML IJ SOLN
INTRAMUSCULAR | Status: DC | PRN
  Administered 2016-07-23 (×2): 1 mg via INTRAVENOUS

## 2016-07-23 MED ORDER — SODIUM CHLORIDE 0.9 % IV SOLN: 250.0000 mL | INTRAVENOUS | Status: DC | PRN

## 2016-07-23 MED ORDER — ASPIRIN 81 MG PO CHEW: 81.0000 mg | CHEWABLE_TABLET | ORAL | Status: DC

## 2016-07-23 MED ORDER — SODIUM CHLORIDE 0.9 % WEIGHT BASED INFUSION
1.0000 mL/kg/h | INTRAVENOUS | Status: DC
  Administered 2016-07-23: 1 mL/kg/h via INTRAVENOUS

## 2016-07-23 MED ORDER — HEPARIN (PORCINE) IN NACL 2-0.9 UNIT/ML-% IJ SOLN
INTRAMUSCULAR | Status: DC | PRN
Start: 2016-07-23 — End: 2016-07-23
  Administered 2016-07-23: 1000 mL

## 2016-07-23 MED ORDER — FENTANYL CITRATE (PF) 100 MCG/2ML IJ SOLN
INTRAMUSCULAR | Status: AC
  Filled 2016-07-23: qty 2

## 2016-07-23 MED ORDER — OXYCODONE HCL 5 MG PO TABS
ORAL_TABLET | ORAL | Status: AC
  Administered 2016-07-23: 5 mg via ORAL
  Filled 2016-07-23: qty 1

## 2016-07-23 MED ORDER — LIDOCAINE HCL (PF) 1 % IJ SOLN
INTRAMUSCULAR | Status: DC | PRN
  Administered 2016-07-23: 10 mL via SUBCUTANEOUS

## 2016-07-23 MED ORDER — SODIUM CHLORIDE 0.9 % WEIGHT BASED INFUSION
3.0000 mL/kg/h | INTRAVENOUS | Status: AC
  Administered 2016-07-23: 3 mL/kg/h via INTRAVENOUS

## 2016-07-23 MED ORDER — OXYCODONE-ACETAMINOPHEN 5-325 MG PO TABS
ORAL_TABLET | ORAL | Status: AC
  Administered 2016-07-23: 1 via ORAL
  Filled 2016-07-23: qty 1

## 2016-07-23 MED ORDER — HYDRALAZINE HCL 20 MG/ML IJ SOLN
INTRAMUSCULAR | Status: AC
  Filled 2016-07-23: qty 1

## 2016-07-23 MED ORDER — OXYCODONE HCL 5 MG PO TABS
5.0000 mg | ORAL_TABLET | Freq: Once | ORAL | Status: AC
  Administered 2016-07-23: 5 mg via ORAL

## 2016-07-23 MED ORDER — LIDOCAINE HCL (PF) 1 % IJ SOLN
INTRAMUSCULAR | Status: AC
  Filled 2016-07-23: qty 30

## 2016-07-23 MED ORDER — OXYCODONE HCL 5 MG PO TABS
5.0000 mg | ORAL_TABLET | ORAL | Status: DC | PRN
  Administered 2016-07-23: 5 mg via ORAL

## 2016-07-23 MED ORDER — HYDRALAZINE HCL 20 MG/ML IJ SOLN
INTRAMUSCULAR | Status: DC | PRN
  Administered 2016-07-23: 10 mg via INTRAVENOUS

## 2016-07-23 MED ORDER — OXYCODONE-ACETAMINOPHEN 5-325 MG PO TABS
1.0000 | ORAL_TABLET | Freq: Once | ORAL | Status: AC
Start: 2016-07-23 — End: 2016-07-23
  Administered 2016-07-23: 1 via ORAL

## 2016-07-23 MED ORDER — MIDAZOLAM HCL 2 MG/2ML IJ SOLN
INTRAMUSCULAR | Status: AC
  Filled 2016-07-23: qty 2

## 2016-07-23 MED ORDER — OXYCODONE-ACETAMINOPHEN 5-325 MG PO TABS
1.0000 | ORAL_TABLET | ORAL | Status: DC | PRN
  Administered 2016-07-23: 1 via ORAL

## 2016-07-23 MED ORDER — LABETALOL HCL 5 MG/ML IV SOLN
INTRAVENOUS | Status: AC
  Filled 2016-07-23: qty 4

## 2016-07-23 MED ORDER — SODIUM CHLORIDE 0.9 % IV SOLN: 1.0000 mL/kg/h | INTRAVENOUS | Status: DC

## 2016-07-23 MED ORDER — SODIUM CHLORIDE 0.9% FLUSH: 3.0000 mL | Freq: Two times a day (BID) | INTRAVENOUS | Status: DC

## 2016-07-23 MED ORDER — FENTANYL CITRATE (PF) 100 MCG/2ML IJ SOLN
INTRAMUSCULAR | Status: DC | PRN
  Administered 2016-07-23 (×2): 25 ug via INTRAVENOUS

## 2016-07-23 MED ORDER — IODIXANOL 320 MG/ML IV SOLN
INTRAVENOUS | Status: DC | PRN
  Administered 2016-07-23: 117 mL

## 2016-07-23 MED ORDER — LABETALOL HCL 5 MG/ML IV SOLN
20.0000 mg | Freq: Once | INTRAVENOUS | Status: AC
  Administered 2016-07-23: 20 mg via INTRAVENOUS

## 2016-07-23 MED ORDER — SODIUM CHLORIDE 0.9% FLUSH: 3.0000 mL | INTRAVENOUS | Status: DC | PRN

## 2016-07-23 MED ORDER — OXYCODONE HCL 5 MG PO TABS
ORAL_TABLET | ORAL | Status: AC
  Filled 2016-07-23: qty 1

## 2016-07-23 MED ORDER — OXYCODONE-ACETAMINOPHEN 5-325 MG PO TABS
ORAL_TABLET | ORAL | Status: AC
  Filled 2016-07-23: qty 1

## 2016-07-23 SURGICAL SUPPLY — 8 items
CATH OMNI FLUSH 5F 65CM (CATHETERS) ×3 IMPLANT
KIT MICROINTRODUCER STIFF 5F (SHEATH) ×3 IMPLANT
KIT PV (KITS) ×3 IMPLANT
SHEATH PINNACLE 5F 10CM (SHEATH) ×3 IMPLANT
SYR MEDRAD MARK V 150ML (SYRINGE) ×3 IMPLANT
TRANSDUCER W/STOPCOCK (MISCELLANEOUS) ×3 IMPLANT
TRAY PV CATH (CUSTOM PROCEDURE TRAY) ×3 IMPLANT
WIRE HITORQ VERSACORE ST 145CM (WIRE) ×3 IMPLANT

## 2016-07-23 NOTE — Interval H&P Note (Signed)
History and Physical Interval Note:  07/23/2016 1:00 PM  Alejandra Hernandez  has presented today for surgery, with the diagnosis of pad  The various methods of treatment have been discussed with the patient and family. After consideration of risks, benefits and other options for treatment, the patient has consented to  Procedure(s): Abdominal Aortogram w/Lower Extremity (N/A) as a surgical intervention .  The patient's history has been reviewed, patient examined, no change in status, stable for surgery.  I have reviewed the patient's chart and labs.  Questions were answered to the patient's satisfaction.     Shayona Hibbitts

## 2016-07-23 NOTE — H&P (View-Only) (Signed)
Cardiology Office Note   Date:  07/15/2016   ID:  Ileta, Sadosky September 24, 1956, MRN [id number]  PCP:  Dimas Chyle, MD  Cardiologist:  Dr. Radford Pax  Chief Complaint  Patient presents with  . New Patient (Initial Visit)    PV consult      History of Present Illness: Alejandra Hernandez is a 60 y.o. female who was referred by Richardson Dopp for evaluation and management of peripheral arterial disease. She has known history of hypertension, hyperlipidemia, diabetes mellitus, carotid disease, GERD and tobacco use. She was hospitalized in March 2017 for symptomatic anemia with a hemoglobin of 6.2 in the setting of peptic ulcer disease. She was evaluated with a stress test in June for atypical chest pain. The stress test was normal. She is referred for claudication and abnormal Doppler study. She is known to have bilateral carotid disease. Carotid Doppler in July showed 60-79% right ICA stenosis and 40-59% left ICA stenosis. Noninvasive vascular studies showed an ABI of 0.73 on the right and 0.64 on the left. Duplex showed moderate iliac disease on the right, moderate right common femoral artery stenosis, significant right SFA stenosis and borderline significant left SFA stenosis. She reports bilateral calf pain after walking about half a block. The symptoms are worse on the right side than the left side. She also started having right thigh pain with walking recently. She suffers from left foot tendinitis and fasciitis. She also noted dark discoloration at the base of the left big toe. She reports having an infection affecting the big toenail with plans to remove that by Dr. Barkley Bruns.   Past Medical History:  Diagnosis Date  . AKI (acute kidney injury) ([place]) 01/2016  . Anxiety   . Carotid artery disease ([place])    a. Carotid US 7/16: Bilateral ICA 60-79% >> follow-up 1 year  //  b. Carotid US 7/17: A999333 RICA; 123456 LICA >> Follow-up 1 year  . Chronic back pain   . Diabetes mellitus    INSULIN  DEPEDENT  . GI bleed 01/2016  . Gout   . History of echocardiogram    a. Echo 3/17 mild concentric LVH, vigorous LVF, EF 65-70%, normal wall motion, grade 1 diastolic dysfunction, mild TR, PASP 31 mmHg  . History of nuclear stress test    a. Myoview 6/15: Low risk, no scar or ischemia, EF 72%  //  b. Myoview 7/17: EF 82%, no scar or ischemia, low risk  . Hyperlipidemia   . Hypertension   . PAD (peripheral artery disease) ([place])    a. ABIs 7/17: R 0.73, L 0.64, waveforms suggest bilateral SFA disease    Past Surgical History:  Procedure Laterality Date  . CERVICAL FUSION    . CESAREAN SECTION    . ESOPHAGOGASTRODUODENOSCOPY Left 02/12/2016   Procedure: ESOPHAGOGASTRODUODENOSCOPY (EGD);  Surgeon: Arta Silence, MD;  Location: [facility] ENDOSCOPY;  Service: Endoscopy;  Laterality: Left;  . KNEE ARTHROSCOPY    . LYMPH GLAND EXCISION    . SHOULDER SURGERY     left side     Current Outpatient Prescriptions  Medication Sig Dispense Refill  . allopurinol (ZYLOPRIM) 100 MG tablet Take 100 mg by mouth 2 (two) times daily.     Marland Kitchen ALPRAZolam (XANAX) 0.5 MG tablet Take 0.25 mg by mouth daily.     Marland Kitchen amitriptyline (ELAVIL) 25 MG tablet Take 50 mg by mouth at bedtime.  5  . amLODipine (NORVASC) 10 MG tablet Take 10 mg by mouth daily with supper.     Marland Kitchen  aspirin 81 MG tablet Take 81 mg by mouth daily.    . cloNIDine (CATAPRES) 0.2 MG tablet Take 0.2 mg by mouth daily.    . ferrous sulfate 325 (65 FE) MG tablet Take 325 mg by mouth 2 (two) times daily with a meal.    . glucose blood test strip Use as instructed 100 each 12  . insulin aspart (NOVOLOG FLEXPEN) 100 UNIT/ML FlexPen Inject 2 Units into the skin 2 (two) times daily.    . Insulin Glargine (LANTUS SOLOSTAR) 100 UNIT/ML Solostar Pen Inject 26 Units into the skin 2 (two) times daily.    . isosorbide mononitrate (IMDUR) 30 MG 24 hr tablet Take 0.5 tablets (15 mg total) by mouth daily. 30 tablet 6  . lisinopril (PRINIVIL,ZESTRIL) 20 MG tablet Take 20  mg by mouth daily.  0  . nitroGLYCERIN (NITROSTAT) 0.4 MG SL tablet Place 1 tablet (0.4 mg total) under the tongue every 5 (five) minutes as needed for chest pain. 30 tablet 0  . oxyCODONE-acetaminophen (PERCOCET) 10-325 MG tablet Take 1 tablet by mouth every 6 (six) hours as needed for pain.     . pantoprazole (PROTONIX) 40 MG tablet TAKE 1 TABLET BY MOUTH TWICE DAILY BEFORE A MEAL 60 tablet 5  . rosuvastatin (CRESTOR) 40 MG tablet Take 1 tablet (40 mg total) by mouth daily. 90 tablet 3  . spironolactone (ALDACTONE) 12.5 mg TABS tablet Take 12.5 mg by mouth daily.     No current facility-administered medications for this visit.     Allergies:   Review of patient's allergies indicates no known allergies.    Social History:  The patient  reports that she quit smoking about 2 months ago. Her smoking use included Cigarettes. She has a 20.00 pack-year smoking history. She quit smokeless tobacco use about 49 years ago. Her smokeless tobacco use included Snuff and Chew. She reports that she does not drink alcohol or use drugs.   Family History:  The patient's family history includes Diabetes in her mother and sister; Heart disease in her mother; Kidney failure in her mother.    ROS:  Please see the history of present illness.   Otherwise, review of systems are positive for none.   All other systems are reviewed and negative.    PHYSICAL EXAM: VS:  BP 122/68   Pulse 78   Ht 5\' 3"  (1.6 m)   Wt 158 lb (71.7 kg)   BMI 27.99 kg/m  , BMI Body mass index is 27.99 kg/m. GEN: Well nourished, well developed, in no acute distress  HEENT: normal  Neck: no JVD, carotid bruits, or masses Cardiac: RRR; no murmurs, rubs, or gallops,no edema  Respiratory:  clear to auscultation bilaterally, normal work of breathing GI: soft, nontender, nondistended, + BS MS: no deformity or atrophy  Skin: warm and dry, no rash Neuro:  Strength and sensation are intact Psych: euthymic mood, full affect Vascular:  Femoral pulses +1 on the right and +2 on the left. Distal pulses are not palpable. There is dark discoloration at the base of the left big toe with dystrophic nail.  EKG:  EKG is not ordered today.    Recent Labs: 04/19/2016: ALT 28 04/20/2016: Magnesium 1.4; TSH 1.251 04/23/2016: Hemoglobin 10.9; Platelets 416 05/08/2016: Brain Natriuretic Peptide 16.9 05/22/2016: BUN 20; Creat 1.27; Potassium 4.6; Sodium 130    Lipid Panel    Component Value Date/Time   CHOL 265 (H) 04/20/2016 0100   TRIG 113 04/20/2016 0100   HDL 85  04/20/2016 0100   CHOLHDL 3.1 04/20/2016 0100   VLDL 23 04/20/2016 0100   LDLCALC 157 (H) 04/20/2016 0100      Wt Readings from Last 3 Encounters:  07/15/16 158 lb (71.7 kg)  06/09/16 154 lb 12.8 oz (70.2 kg)  06/05/16 151 lb (68.5 kg)        ASSESSMENT AND PLAN:  1.  Peripheral arterial disease:Severe bilateral leg claudication worse on the right side. However, there might have been an embolic event to the left big toe. There are also plans to remove the big toenail and thus I think we should focus on the left leg initially although her symptoms are worse on the right. Given severity of her symptoms, I recommend proceeding with abdominal aortogram with lower extremity runoff and possible endovascular intervention with focus on the left leg. She does have mild chronic kidney disease and thus would bring her early for hydration. I discussed the procedure in details as well as risk and benefits.  2. Bilateral carotid disease: Continue treatment of risk factors and repeat Doppler in 1 year  3. Hyperlipidemia: Continue high-dose rosuvastatin with a target LDL of less than 70.  4. Tobacco use: I discussed with her the importance of smoking cessation.  Disposition:   FU with me in 1 month  Signed,  Kathlyn Sacramento, MD  07/15/2016 5:40 PM    [place]

## 2016-07-23 NOTE — Progress Notes (Signed)
Site area: Right femoral artery  Site Prior to Removal: level 0  Pressure Applied For 20 MINUTES   Minutes Beginning at 1415  Manual: yes   Patient Status During Pull: no complications, no patient complaints, alert and oriented.  Post Pull Groin Site: level 0  Post Pull Instructions Given:yes, verbalized understanding   Post Pull Pulses Present: doppler   Dressing Applied:guaze, clean, dry intact

## 2016-07-23 NOTE — Progress Notes (Signed)
Patient placed on bedpan, attempting to void.

## 2016-07-23 NOTE — Discharge Instructions (Signed)

## 2016-07-24 ENCOUNTER — Encounter (HOSPITAL_COMMUNITY): Payer: Self-pay | Admitting: Internal Medicine

## 2016-07-30 ENCOUNTER — Encounter (HOSPITAL_COMMUNITY): Payer: Self-pay | Admitting: General Practice

## 2016-07-30 ENCOUNTER — Ambulatory Visit (HOSPITAL_COMMUNITY)
Admission: RE | Admit: 2016-07-30 | Discharge: 2016-08-01 | Disposition: A | Discharge status: Home / Self Care | Payer: Medicare Other | Source: Ambulatory Visit | Attending: Cardiovascular Disease | Admitting: Cardiovascular Disease

## 2016-07-30 ENCOUNTER — Encounter (HOSPITAL_COMMUNITY)
Admission: RE | Disposition: A | Discharge status: Home / Self Care | Payer: Self-pay | Source: Ambulatory Visit | Attending: Cardiovascular Disease

## 2016-07-30 DIAGNOSIS — Z6828 Body mass index (BMI) 28.0-28.9, adult: Secondary | ICD-10-CM | POA: Diagnosis not present

## 2016-07-30 DIAGNOSIS — I739 Peripheral vascular disease, unspecified: Secondary | ICD-10-CM | POA: Diagnosis present

## 2016-07-30 DIAGNOSIS — I7092 Chronic total occlusion of artery of the extremities: Secondary | ICD-10-CM | POA: Diagnosis not present

## 2016-07-30 DIAGNOSIS — I129 Hypertensive chronic kidney disease with stage 1 through stage 4 chronic kidney disease, or unspecified chronic kidney disease: Secondary | ICD-10-CM | POA: Diagnosis not present

## 2016-07-30 DIAGNOSIS — F419 Anxiety disorder, unspecified: Secondary | ICD-10-CM | POA: Diagnosis not present

## 2016-07-30 DIAGNOSIS — Z833 Family history of diabetes mellitus: Secondary | ICD-10-CM | POA: Insufficient documentation

## 2016-07-30 DIAGNOSIS — M549 Dorsalgia, unspecified: Secondary | ICD-10-CM | POA: Diagnosis not present

## 2016-07-30 DIAGNOSIS — M109 Gout, unspecified: Secondary | ICD-10-CM | POA: Insufficient documentation

## 2016-07-30 DIAGNOSIS — R339 Retention of urine, unspecified: Secondary | ICD-10-CM | POA: Diagnosis not present

## 2016-07-30 DIAGNOSIS — E1140 Type 2 diabetes mellitus with diabetic neuropathy, unspecified: Secondary | ICD-10-CM | POA: Insufficient documentation

## 2016-07-30 DIAGNOSIS — E669 Obesity, unspecified: Secondary | ICD-10-CM | POA: Insufficient documentation

## 2016-07-30 DIAGNOSIS — G8929 Other chronic pain: Secondary | ICD-10-CM | POA: Diagnosis not present

## 2016-07-30 DIAGNOSIS — Z794 Long term (current) use of insulin: Secondary | ICD-10-CM | POA: Insufficient documentation

## 2016-07-30 DIAGNOSIS — E1122 Type 2 diabetes mellitus with diabetic chronic kidney disease: Secondary | ICD-10-CM | POA: Diagnosis not present

## 2016-07-30 DIAGNOSIS — I70212 Atherosclerosis of native arteries of extremities with intermittent claudication, left leg: Secondary | ICD-10-CM | POA: Insufficient documentation

## 2016-07-30 DIAGNOSIS — Z79899 Other long term (current) drug therapy: Secondary | ICD-10-CM | POA: Insufficient documentation

## 2016-07-30 DIAGNOSIS — N182 Chronic kidney disease, stage 2 (mild): Secondary | ICD-10-CM | POA: Diagnosis not present

## 2016-07-30 DIAGNOSIS — Z87891 Personal history of nicotine dependence: Secondary | ICD-10-CM | POA: Insufficient documentation

## 2016-07-30 DIAGNOSIS — D649 Anemia, unspecified: Secondary | ICD-10-CM | POA: Diagnosis not present

## 2016-07-30 DIAGNOSIS — E785 Hyperlipidemia, unspecified: Secondary | ICD-10-CM | POA: Diagnosis not present

## 2016-07-30 DIAGNOSIS — K219 Gastro-esophageal reflux disease without esophagitis: Secondary | ICD-10-CM | POA: Diagnosis not present

## 2016-07-30 DIAGNOSIS — Z7982 Long term (current) use of aspirin: Secondary | ICD-10-CM | POA: Insufficient documentation

## 2016-07-30 HISTORY — DX: Other chronic pain: G89.29

## 2016-07-30 HISTORY — DX: Unspecified osteoarthritis, unspecified site: M19.90

## 2016-07-30 HISTORY — DX: Type 2 diabetes mellitus without complications: E11.9

## 2016-07-30 HISTORY — DX: Chronic or unspecified gastric ulcer with hemorrhage: K25.4

## 2016-07-30 HISTORY — DX: Gastro-esophageal reflux disease without esophagitis: K21.9

## 2016-07-30 HISTORY — DX: Other chronic pain: M54.50

## 2016-07-30 HISTORY — DX: Headache, unspecified: R51.9

## 2016-07-30 HISTORY — PX: PERIPHERAL VASCULAR CATHETERIZATION: SHX172C

## 2016-07-30 HISTORY — DX: Type 2 diabetes mellitus with diabetic polyneuropathy: E11.42

## 2016-07-30 HISTORY — DX: Anemia, unspecified: D64.9

## 2016-07-30 HISTORY — DX: Low back pain: M54.5

## 2016-07-30 HISTORY — DX: Headache: R51

## 2016-07-30 HISTORY — DX: Personal history of other medical treatment: Z92.89

## 2016-07-30 LAB — CBC
HEMATOCRIT: 32 % — AB (ref 36.0–46.0)
Hemoglobin: 10.3 g/dL — ABNORMAL LOW (ref 12.0–15.0)
MCH: 22.8 pg — ABNORMAL LOW (ref 26.0–34.0)
MCHC: 32.2 g/dL (ref 30.0–36.0)
MCV: 70.8 fL — ABNORMAL LOW (ref 78.0–100.0)
Platelets: 359 10*3/uL (ref 150–400)
RBC: 4.52 MIL/uL (ref 3.87–5.11)
RDW: 16.6 % — AB (ref 11.5–15.5)
WBC: 8.8 10*3/uL (ref 4.0–10.5)

## 2016-07-30 LAB — POCT ACTIVATED CLOTTING TIME
ACTIVATED CLOTTING TIME: 219 s
ACTIVATED CLOTTING TIME: 219 s
ACTIVATED CLOTTING TIME: 274 s
ACTIVATED CLOTTING TIME: 180 s
Activated Clotting Time: 147 seconds

## 2016-07-30 LAB — BASIC METABOLIC PANEL
Anion gap: 11 (ref 5–15)
BUN: 11 mg/dL (ref 6–20)
CALCIUM: 9.1 mg/dL (ref 8.9–10.3)
CO2: 20 mmol/L — AB (ref 22–32)
CREATININE: 1.2 mg/dL — AB (ref 0.44–1.00)
Chloride: 95 mmol/L — ABNORMAL LOW (ref 101–111)
GFR calc non Af Amer: 48 mL/min — ABNORMAL LOW (ref >60)
GFR, EST AFRICAN AMERICAN: 56 mL/min — AB (ref >60)
Glucose, Bld: 295 mg/dL — ABNORMAL HIGH (ref 65–99)
Potassium: 4.3 mmol/L (ref 3.5–5.1)
Sodium: 126 mmol/L — ABNORMAL LOW (ref 135–145)

## 2016-07-30 LAB — GLUCOSE, CAPILLARY
GLUCOSE-CAPILLARY: 341 mg/dL — AB (ref 65–99)
GLUCOSE-CAPILLARY: 231 mg/dL — AB (ref 65–99)
Glucose-Capillary: 251 mg/dL — ABNORMAL HIGH (ref 65–99)

## 2016-07-30 SURGERY — PERIPHERAL VASCULAR INTERVENTION
Anesthesia: LOCAL | Laterality: Bilateral

## 2016-07-30 MED ORDER — HEPARIN SODIUM (PORCINE) 1000 UNIT/ML IJ SOLN
INTRAMUSCULAR | Status: AC
  Filled 2016-07-30: qty 1

## 2016-07-30 MED ORDER — ONDANSETRON HCL 4 MG/2ML IJ SOLN
4.0000 mg | Freq: Four times a day (QID) | INTRAMUSCULAR | Status: DC | PRN
  Administered 2016-07-30: 22:00:00 4 mg via INTRAVENOUS
  Filled 2016-07-30: qty 2

## 2016-07-30 MED ORDER — CLOPIDOGREL BISULFATE 75 MG PO TABS
75.0000 mg | ORAL_TABLET | Freq: Every day | ORAL | Status: DC
  Administered 2016-07-31 – 2016-08-01 (×2): 75 mg via ORAL
  Filled 2016-07-30 (×2): qty 1

## 2016-07-30 MED ORDER — ASPIRIN 81 MG PO TABS: 81.0000 mg | ORAL_TABLET | Freq: Every day | ORAL | Status: DC

## 2016-07-30 MED ORDER — ALLOPURINOL 100 MG PO TABS
100.0000 mg | ORAL_TABLET | Freq: Two times a day (BID) | ORAL | Status: DC
  Administered 2016-07-30 – 2016-08-01 (×4): 100 mg via ORAL
  Filled 2016-07-30 (×4): qty 1

## 2016-07-30 MED ORDER — SODIUM CHLORIDE 0.9 % WEIGHT BASED INFUSION
3.0000 mL/kg/h | INTRAVENOUS | Status: DC
  Administered 2016-07-30: 3 mL/kg/h via INTRAVENOUS

## 2016-07-30 MED ORDER — HEPARIN (PORCINE) IN NACL 2-0.9 UNIT/ML-% IJ SOLN
INTRAMUSCULAR | Status: DC | PRN
  Administered 2016-07-30: 1000 mL

## 2016-07-30 MED ORDER — OXYCODONE-ACETAMINOPHEN 5-325 MG PO TABS
ORAL_TABLET | ORAL | Status: AC
  Filled 2016-07-30: qty 2

## 2016-07-30 MED ORDER — LABETALOL HCL 5 MG/ML IV SOLN
20.0000 mg | Freq: Once | INTRAVENOUS | Status: AC
  Administered 2016-07-30: 20 mg via INTRAVENOUS
  Filled 2016-07-30: qty 4

## 2016-07-30 MED ORDER — INSULIN ASPART 100 UNIT/ML ~~LOC~~ SOLN: 0.0000 [IU] | Freq: Every day | SUBCUTANEOUS | Status: DC

## 2016-07-30 MED ORDER — AMITRIPTYLINE HCL 25 MG PO TABS
50.0000 mg | ORAL_TABLET | Freq: Every day | ORAL | Status: DC
  Administered 2016-07-30 – 2016-07-31 (×2): 50 mg via ORAL
  Filled 2016-07-30 (×2): qty 2

## 2016-07-30 MED ORDER — AMLODIPINE BESYLATE 10 MG PO TABS
10.0000 mg | ORAL_TABLET | Freq: Every day | ORAL | Status: DC
  Administered 2016-07-30 – 2016-07-31 (×2): 10 mg via ORAL
  Filled 2016-07-30 (×2): qty 1

## 2016-07-30 MED ORDER — CLOPIDOGREL BISULFATE 300 MG PO TABS
ORAL_TABLET | ORAL | Status: AC
  Filled 2016-07-30: qty 1

## 2016-07-30 MED ORDER — NITROGLYCERIN 0.4 MG SL SUBL: 0.4000 mg | SUBLINGUAL_TABLET | SUBLINGUAL | Status: DC | PRN

## 2016-07-30 MED ORDER — IODIXANOL 320 MG/ML IV SOLN
INTRAVENOUS | Status: DC | PRN
  Administered 2016-07-30: 150 mL via INTRA_ARTERIAL

## 2016-07-30 MED ORDER — SPIRONOLACTONE 25 MG PO TABS
12.5000 mg | ORAL_TABLET | Freq: Every day | ORAL | Status: DC
  Administered 2016-07-31 – 2016-08-01 (×2): 12.5 mg via ORAL
  Filled 2016-07-30 (×2): qty 1

## 2016-07-30 MED ORDER — SODIUM CHLORIDE 0.9 % IV SOLN
INTRAVENOUS | Status: AC
Start: 2016-07-30 — End: 2016-07-30
  Administered 2016-07-30: 17:00:00 via INTRAVENOUS

## 2016-07-30 MED ORDER — HEPARIN (PORCINE) IN NACL 2-0.9 UNIT/ML-% IJ SOLN
INTRAMUSCULAR | Status: AC
  Filled 2016-07-30: qty 500

## 2016-07-30 MED ORDER — LIDOCAINE HCL (PF) 1 % IJ SOLN
INTRAMUSCULAR | Status: AC
Start: 2016-07-30 — End: 2016-07-30
  Filled 2016-07-30: qty 30

## 2016-07-30 MED ORDER — ROSUVASTATIN CALCIUM 20 MG PO TABS
40.0000 mg | ORAL_TABLET | Freq: Every day | ORAL | Status: DC
  Administered 2016-07-30 – 2016-07-31 (×2): 40 mg via ORAL
  Filled 2016-07-30 (×2): qty 2

## 2016-07-30 MED ORDER — FENTANYL CITRATE (PF) 100 MCG/2ML IJ SOLN
INTRAMUSCULAR | Status: DC | PRN
  Administered 2016-07-30 (×2): 50 ug via INTRAVENOUS

## 2016-07-30 MED ORDER — LISINOPRIL 10 MG PO TABS
20.0000 mg | ORAL_TABLET | Freq: Every day | ORAL | Status: DC
  Administered 2016-07-31 – 2016-08-01 (×2): 20 mg via ORAL
  Filled 2016-07-30 (×2): qty 2

## 2016-07-30 MED ORDER — SODIUM CHLORIDE 0.9 % IV SOLN: 250.0000 mL | INTRAVENOUS | Status: DC | PRN

## 2016-07-30 MED ORDER — LIDOCAINE HCL (PF) 1 % IJ SOLN
INTRAMUSCULAR | Status: DC | PRN
  Administered 2016-07-30: 15 mL via INTRADERMAL

## 2016-07-30 MED ORDER — CLONIDINE HCL 0.1 MG PO TABS
0.2000 mg | ORAL_TABLET | Freq: Every day | ORAL | Status: DC
  Administered 2016-07-31 – 2016-08-01 (×2): 0.2 mg via ORAL
  Filled 2016-07-30 (×3): qty 2

## 2016-07-30 MED ORDER — ALPRAZOLAM 0.25 MG PO TABS
0.2500 mg | ORAL_TABLET | Freq: Every day | ORAL | Status: DC | PRN
  Administered 2016-07-30 – 2016-07-31 (×2): 0.25 mg via ORAL
  Filled 2016-07-30 (×2): qty 1

## 2016-07-30 MED ORDER — SODIUM CHLORIDE 0.9% FLUSH: 3.0000 mL | Freq: Two times a day (BID) | INTRAVENOUS | Status: DC

## 2016-07-30 MED ORDER — FENTANYL CITRATE (PF) 100 MCG/2ML IJ SOLN
INTRAMUSCULAR | Status: AC
  Filled 2016-07-30: qty 2

## 2016-07-30 MED ORDER — INSULIN GLARGINE 100 UNIT/ML ~~LOC~~ SOLN
26.0000 [IU] | Freq: Two times a day (BID) | SUBCUTANEOUS | Status: DC
  Administered 2016-07-31: 10:00:00 26 [IU] via SUBCUTANEOUS
  Administered 2016-08-01: 13 [IU] via SUBCUTANEOUS
  Filled 2016-07-30 (×5): qty 0.26

## 2016-07-30 MED ORDER — IOPAMIDOL (ISOVUE-370) INJECTION 76%
INTRAVENOUS | Status: AC
  Filled 2016-07-30: qty 50

## 2016-07-30 MED ORDER — CLOPIDOGREL BISULFATE 300 MG PO TABS
ORAL_TABLET | ORAL | Status: DC | PRN
  Administered 2016-07-30: 300 mg via ORAL

## 2016-07-30 MED ORDER — OXYCODONE-ACETAMINOPHEN 5-325 MG PO TABS
2.0000 | ORAL_TABLET | Freq: Four times a day (QID) | ORAL | Status: DC | PRN
  Administered 2016-07-30 – 2016-08-01 (×4): 2 via ORAL
  Filled 2016-07-30 (×3): qty 2

## 2016-07-30 MED ORDER — HEPARIN SODIUM (PORCINE) 1000 UNIT/ML IJ SOLN
INTRAMUSCULAR | Status: DC | PRN
  Administered 2016-07-30: 5000 [IU] via INTRAVENOUS
  Administered 2016-07-30: 3000 [IU] via INTRAVENOUS

## 2016-07-30 MED ORDER — MIDAZOLAM HCL 2 MG/2ML IJ SOLN
INTRAMUSCULAR | Status: AC
  Filled 2016-07-30: qty 2

## 2016-07-30 MED ORDER — ISOSORBIDE MONONITRATE ER 30 MG PO TB24
15.0000 mg | ORAL_TABLET | Freq: Every day | ORAL | Status: DC
  Administered 2016-07-31 – 2016-08-01 (×2): 15 mg via ORAL
  Filled 2016-07-30 (×2): qty 1

## 2016-07-30 MED ORDER — SODIUM CHLORIDE 0.9% FLUSH: 3.0000 mL | INTRAVENOUS | Status: DC | PRN

## 2016-07-30 MED ORDER — SODIUM CHLORIDE 0.9 % WEIGHT BASED INFUSION: 1.0000 mL/kg/h | INTRAVENOUS | Status: DC

## 2016-07-30 MED ORDER — ASPIRIN 81 MG PO CHEW: 81.0000 mg | CHEWABLE_TABLET | ORAL | Status: DC

## 2016-07-30 MED ORDER — MORPHINE SULFATE (PF) 2 MG/ML IV SOLN
2.0000 mg | INTRAVENOUS | Status: DC | PRN
  Administered 2016-07-30 (×2): 2 mg via INTRAVENOUS
  Administered 2016-07-31 – 2016-08-01 (×3): 4 mg via INTRAVENOUS
  Filled 2016-07-30 (×6): qty 2

## 2016-07-30 MED ORDER — PANTOPRAZOLE SODIUM 40 MG PO TBEC
40.0000 mg | DELAYED_RELEASE_TABLET | Freq: Two times a day (BID) | ORAL | Status: DC
  Administered 2016-07-31 – 2016-08-01 (×4): 40 mg via ORAL
  Filled 2016-07-30 (×5): qty 1

## 2016-07-30 MED ORDER — INSULIN GLARGINE 100 UNIT/ML SOLOSTAR PEN: 60.0000 [IU] | PEN_INJECTOR | Freq: Two times a day (BID) | SUBCUTANEOUS | Status: DC

## 2016-07-30 MED ORDER — INSULIN ASPART 100 UNIT/ML ~~LOC~~ SOLN
0.0000 [IU] | Freq: Three times a day (TID) | SUBCUTANEOUS | Status: DC
  Administered 2016-07-31: 19:00:00 5 [IU] via SUBCUTANEOUS
  Administered 2016-07-31: 8 [IU] via SUBCUTANEOUS
  Administered 2016-07-31: 14:00:00 15 [IU] via SUBCUTANEOUS
  Administered 2016-08-01: 11 [IU] via SUBCUTANEOUS

## 2016-07-30 MED ORDER — FERROUS SULFATE 325 (65 FE) MG PO TABS
325.0000 mg | ORAL_TABLET | Freq: Two times a day (BID) | ORAL | Status: DC
  Administered 2016-07-31 – 2016-08-01 (×3): 325 mg via ORAL
  Filled 2016-07-30 (×4): qty 1

## 2016-07-30 MED ORDER — MIDAZOLAM HCL 2 MG/2ML IJ SOLN
INTRAMUSCULAR | Status: DC | PRN
  Administered 2016-07-30 (×2): 1 mg via INTRAVENOUS

## 2016-07-30 MED ORDER — ACETAMINOPHEN 325 MG PO TABS: 650.0000 mg | ORAL_TABLET | ORAL | Status: DC | PRN

## 2016-07-30 MED ORDER — ATROPINE SULFATE 1 MG/10ML IJ SOSY
PREFILLED_SYRINGE | INTRAMUSCULAR | Status: AC
  Administered 2016-07-30 (×2): 0.5 mg
  Filled 2016-07-30: qty 10

## 2016-07-30 MED ORDER — ASPIRIN EC 81 MG PO TBEC
81.0000 mg | DELAYED_RELEASE_TABLET | Freq: Every day | ORAL | Status: DC
  Administered 2016-07-31 – 2016-08-01 (×2): 81 mg via ORAL
  Filled 2016-07-30 (×2): qty 1

## 2016-07-30 SURGICAL SUPPLY — 24 items
BALLN ARMADA 2.5X80X150 (BALLOONS) ×2 IMPLANT
BALLN IN.PACT DCB 4X120 (BALLOONS) ×2
BALLN IN.PACT DCB 4X150 (BALLOONS) ×2
CATH HAWKONE LX EXTENDED TIP (CATHETERS) ×2 IMPLANT
CATH OMNI FLUSH 5F 65CM (CATHETERS) ×2 IMPLANT
CATH QUICKCROSS .018X135CM (MICROCATHETER) ×2 IMPLANT
DCB IN.PACT 4X120 (BALLOONS) ×1 IMPLANT
DCB IN.PACT 4X150 (BALLOONS) ×1 IMPLANT
DEVICE SPIDERFX EMB PROT 4MM (WIRE) ×2 IMPLANT
GUIDEWIRE ANGLED .035X150CM (WIRE) ×2 IMPLANT
KIT ENCORE 26 ADVANTAGE (KITS) ×2 IMPLANT
KIT MICROINTRODUCER STIFF 5F (SHEATH) ×2 IMPLANT
KIT PV (KITS) ×2 IMPLANT
SHEATH PINNACLE 5F 10CM (SHEATH) ×2 IMPLANT
SHEATH PINNACLE 7F 10CM (SHEATH) ×2 IMPLANT
SHEATH PINNACLE ST 7F 45CM (SHEATH) ×2 IMPLANT
STOPCOCK MORSE 400PSI 3WAY (MISCELLANEOUS) ×2 IMPLANT
SYRINGE MEDRAD AVANTA MACH 7 (SYRINGE) ×2 IMPLANT
TAPE RADIOPAQUE TURBO (MISCELLANEOUS) ×2 IMPLANT
TRANSDUCER W/STOPCOCK (MISCELLANEOUS) ×2 IMPLANT
TRAY PV CATH (CUSTOM PROCEDURE TRAY) ×2 IMPLANT
TUBING CIL FLEX 10 FLL-RA (TUBING) ×2 IMPLANT
WIRE HITORQ VERSACORE ST 145CM (WIRE) ×2 IMPLANT
WIRE RUNTHROUGH .014X300CM (WIRE) ×2 IMPLANT

## 2016-07-30 NOTE — Interval H&P Note (Signed)
History and Physical Interval Note:  07/30/2016 10:51 AM  Alejandra Hernandez  has presented today for surgery, with the diagnosis of PVD with diagnostic angiography last week demonstrating severe bilateral SFA and runoff disease. The various methods of treatment have been discussed with the patient and family. After consideration of risks, benefits and other options for treatment, the patient has consented to  Procedure(s): Peripheral Vascular Intervention (Bilateral) as a surgical intervention .  The patient's history has been reviewed, patient examined, no change in status, stable for surgery.  I have reviewed the patient's chart and labs.  Questions were answered to the patient's satisfaction.     Alejandra Hernandez

## 2016-07-30 NOTE — H&P (View-Only) (Signed)
Cardiology Office Note   Date:  07/15/2016   ID:  Tifney, Jewart 1956/04/26, MRN [id number]  PCP:  Dimas Chyle, MD  Cardiologist:  Dr. Radford Pax  Chief Complaint  Patient presents with  . New Patient (Initial Visit)    PV consult      History of Present Illness: Alejandra Hernandez is a 60 y.o. female who was referred by Richardson Dopp for evaluation and management of peripheral arterial disease. She has known history of hypertension, hyperlipidemia, diabetes mellitus, carotid disease, GERD and tobacco use. She was hospitalized in March 2017 for symptomatic anemia with a hemoglobin of 6.2 in the setting of peptic ulcer disease. She was evaluated with a stress test in June for atypical chest pain. The stress test was normal. She is referred for claudication and abnormal Doppler study. She is known to have bilateral carotid disease. Carotid Doppler in July showed 60-79% right ICA stenosis and 40-59% left ICA stenosis. Noninvasive vascular studies showed an ABI of 0.73 on the right and 0.64 on the left. Duplex showed moderate iliac disease on the right, moderate right common femoral artery stenosis, significant right SFA stenosis and borderline significant left SFA stenosis. She reports bilateral calf pain after walking about half a block. The symptoms are worse on the right side than the left side. She also started having right thigh pain with walking recently. She suffers from left foot tendinitis and fasciitis. She also noted dark discoloration at the base of the left big toe. She reports having an infection affecting the big toenail with plans to remove that by Dr. Barkley Bruns.   Past Medical History:  Diagnosis Date  . AKI (acute kidney injury) ([place]) 01/2016  . Anxiety   . Carotid artery disease ([place])    a. Carotid US 7/16: Bilateral ICA 60-79% >> follow-up 1 year  //  b. Carotid US 7/17: [id number] RICA; [id number] LICA >> Follow-up 1 year  . Chronic back pain   . Diabetes mellitus    INSULIN  DEPEDENT  . GI bleed 01/2016  . Gout   . History of echocardiogram    a. Echo 3/17 mild concentric LVH, vigorous LVF, EF 65-70%, normal wall motion, grade 1 diastolic dysfunction, mild TR, PASP 31 mmHg  . History of nuclear stress test    a. Myoview 6/15: Low risk, no scar or ischemia, EF 72%  //  b. Myoview 7/17: EF 82%, no scar or ischemia, low risk  . Hyperlipidemia   . Hypertension   . PAD (peripheral artery disease) ([place])    a. ABIs 7/17: R 0.73, L 0.64, waveforms suggest bilateral SFA disease    Past Surgical History:  Procedure Laterality Date  . CERVICAL FUSION    . CESAREAN SECTION    . ESOPHAGOGASTRODUODENOSCOPY Left 02/12/2016   Procedure: ESOPHAGOGASTRODUODENOSCOPY (EGD);  Surgeon: Arta Silence, MD;  [facility]: [facility] ENDOSCOPY;  Service: Endoscopy;  Laterality: Left;  . KNEE ARTHROSCOPY    . LYMPH GLAND EXCISION    . SHOULDER SURGERY     left side     Current Outpatient Prescriptions  Medication Sig Dispense Refill  . allopurinol (ZYLOPRIM) 100 MG tablet Take 100 mg by mouth 2 (two) times daily.     Marland Kitchen ALPRAZolam (XANAX) 0.5 MG tablet Take 0.25 mg by mouth daily.     Marland Kitchen amitriptyline (ELAVIL) 25 MG tablet Take 50 mg by mouth at bedtime.  5  . amLODipine (NORVASC) 10 MG tablet Take 10 mg by mouth daily with supper.     Marland Kitchen  aspirin 81 MG tablet Take 81 mg by mouth daily.    . cloNIDine (CATAPRES) 0.2 MG tablet Take 0.2 mg by mouth daily.    . ferrous sulfate 325 (65 FE) MG tablet Take 325 mg by mouth 2 (two) times daily with a meal.    . glucose blood test strip Use as instructed 100 each 12  . insulin aspart (NOVOLOG FLEXPEN) 100 UNIT/ML FlexPen Inject 2 Units into the skin 2 (two) times daily.    . Insulin Glargine (LANTUS SOLOSTAR) 100 UNIT/ML Solostar Pen Inject 26 Units into the skin 2 (two) times daily.    . isosorbide mononitrate (IMDUR) 30 MG 24 hr tablet Take 0.5 tablets (15 mg total) by mouth daily. 30 tablet 6  . lisinopril (PRINIVIL,ZESTRIL) 20 MG tablet Take 20  mg by mouth daily.  0  . nitroGLYCERIN (NITROSTAT) 0.4 MG SL tablet Place 1 tablet (0.4 mg total) under the tongue every 5 (five) minutes as needed for chest pain. 30 tablet 0  . oxyCODONE-acetaminophen (PERCOCET) 10-325 MG tablet Take 1 tablet by mouth every 6 (six) hours as needed for pain.     . pantoprazole (PROTONIX) 40 MG tablet TAKE 1 TABLET BY MOUTH TWICE DAILY BEFORE A MEAL 60 tablet 5  . rosuvastatin (CRESTOR) 40 MG tablet Take 1 tablet (40 mg total) by mouth daily. 90 tablet 3  . spironolactone (ALDACTONE) 12.5 mg TABS tablet Take 12.5 mg by mouth daily.     No current facility-administered medications for this visit.     Allergies:   Review of patient's allergies indicates no known allergies.    Social History:  The patient  reports that she quit smoking about 2 months ago. Her smoking use included Cigarettes. She has a 20.00 pack-year smoking history. She quit smokeless tobacco use about 49 years ago. Her smokeless tobacco use included Snuff and Chew. She reports that she does not drink alcohol or use drugs.   Family History:  The patient's family history includes Diabetes in her mother and sister; Heart disease in her mother; Kidney failure in her mother.    ROS:  Please see the history of present illness.   Otherwise, review of systems are positive for none.   All other systems are reviewed and negative.    PHYSICAL EXAM: VS:  BP 122/68   Pulse 78   Ht 5\' 3"  (1.6 m)   Wt 158 lb (71.7 kg)   BMI 27.99 kg/m  , BMI Body mass index is 27.99 kg/m. GEN: Well nourished, well developed, in no acute distress  HEENT: normal  Neck: no JVD, carotid bruits, or masses Cardiac: RRR; no murmurs, rubs, or gallops,no edema  Respiratory:  clear to auscultation bilaterally, normal work of breathing GI: soft, nontender, nondistended, + BS MS: no deformity or atrophy  Skin: warm and dry, no rash Neuro:  Strength and sensation are intact Psych: euthymic mood, full affect Vascular:  Femoral pulses +1 on the right and +2 on the left. Distal pulses are not palpable. There is dark discoloration at the base of the left big toe with dystrophic nail.  EKG:  EKG is not ordered today.    Recent Labs: 04/19/2016: ALT 28 04/20/2016: Magnesium 1.4; TSH 1.251 04/23/2016: Hemoglobin 10.9; Platelets 416 05/08/2016: Brain Natriuretic Peptide 16.9 05/22/2016: BUN 20; Creat 1.27; Potassium 4.6; Sodium 130    Lipid Panel    Component Value Date/Time   CHOL 265 (H) 04/20/2016 0100   TRIG 113 04/20/2016 0100   HDL 85  04/20/2016 0100   CHOLHDL 3.1 04/20/2016 0100   VLDL 23 04/20/2016 0100   LDLCALC 157 (H) 04/20/2016 0100      Wt Readings from Last 3 Encounters:  07/15/16 158 lb (71.7 kg)  06/09/16 154 lb 12.8 oz (70.2 kg)  06/05/16 151 lb (68.5 kg)        ASSESSMENT AND PLAN:  1.  Peripheral arterial disease:Severe bilateral leg claudication worse on the right side. However, there might have been an embolic event to the left big toe. There are also plans to remove the big toenail and thus I think we should focus on the left leg initially although her symptoms are worse on the right. Given severity of her symptoms, I recommend proceeding with abdominal aortogram with lower extremity runoff and possible endovascular intervention with focus on the left leg. She does have mild chronic kidney disease and thus would bring her early for hydration. I discussed the procedure in details as well as risk and benefits.  2. Bilateral carotid disease: Continue treatment of risk factors and repeat Doppler in 1 year  3. Hyperlipidemia: Continue high-dose rosuvastatin with a target LDL of less than 70.  4. Tobacco use: I discussed with her the importance of smoking cessation.  Disposition:   FU with me in 1 month  Signed,  Kathlyn Sacramento, MD  07/15/2016 5:40 PM    [place]

## 2016-07-30 NOTE — Progress Notes (Addendum)
Patient began complaining of not being able to void and bladder discomfort.  Three attempts to place foley catheter by two RNs and NT were unsuccessful.  Dr. Nicki Reaper MacDiarmid called for assistance.  He was able to place a foley catheter and urine return was initially 1500 cc.  Tubing left unclamped per Dr. Matilde Sprang.  Patient's discomfort decreased, and finally dropped to 0.

## 2016-07-30 NOTE — Progress Notes (Signed)
2050- paged Dr. Wynonia Lawman r/t HTN, needed order for IV med in order to proceed with sheath pull 2115-pulled arterial sheath (see smart link) Site area: right groin  Site Prior to Removal:  Level 0  Pressure Applied For 90 MINUTES    Minutes Beginning at 2115  Manual:   Yes.    Patient Status During Pull:  Varied. Multiple vagal response episodes; MD aware  Post Pull Groin Site:  Level 2  Post Pull Instructions Given:  Yes.    Post Pull Pulses Present:  Yes.    Dressing Applied:  Yes.   Femstop   Comments:  Dr. Wynonia Lawman, myself, and Vita Erm RN placed femstop. Hemostasis maintained during placement  2200- called Vita Erm, RN into room, asked for femstop. Roselle held pressure at puncture site and discusses this could be capillary bleeding. With the level of oozing involved Dr. Wynonia Lawman was paged, again @ 2204.  Explained the patient is following commands but has required 2 doses of atropine, a 250 NS fluid bolus, 4mg  iv zofran. Is now complaining of back pain (chronic in nature), becoming more anxious and restless. Dr. Wynonia Lawman to bedside, performs examination and holds pressure at right groin. Oozing is decreased. Decision is made to place femstop on patient. Placed and deflated/removed per [facility] Protocol.

## 2016-07-30 NOTE — Progress Notes (Signed)
Called to bedside by nurse.  Earlier in the evening prior to sheath pull she called for orders for blood pressure and patient was given labetalol and the evening dose of Norvasc.  7 French sheaths was pulled by the nurses been unable to get hemostasis with recurrent bleeding.  I held light pressure for 5 minutes and she did not appear to have a hematoma.  When I let pressure up there was fairly rapid blood flow suggestive of arterial flow.  Suggested putting a FemoStop on.  I spoke with interventional cardiologist who agreed with this.  The patient at the time of left or had a pulse of 75 blood pressure of 130/75 and there was no growing hematoma at the site of the catheterization.  Kerry Hough MD [facility] 10:50 PM

## 2016-07-30 NOTE — Procedures (Signed)
PROCEDURE NOTE -   HPI - 60yo female with history of anxiety, CAD, chronic pain, diabetic neuropathy, GERD, gout, HLD, HTN, DM 2, PAD s/p aortogram today with lower extremity runoff. She has been in urinary retention postoperatively with 3 RN attempts at Foley catheterization which were unsuccessful. She is unaware of any urologic history or problems. She required a catheter once prior in life. She does not see a urologist. She had some mild dysuria prior to her procedure today but was otherwise voiding at baseline.  BP (!) 141/77   Pulse (!) 0   Temp 98.4 F (36.9 C) (Oral)   Resp (!) 0   Ht 5\' 3"  (1.6 m)   Wt 158 lb (71.7 kg)   SpO2 (!) 0%   BMI 27.99 kg/m   Physical Exam:  General - in moderate distress from abdominal pain that she attributes to urinary retention, A&O Abdominal - protuberant, suprapubic discomfort to palpation GU - bilateral 3cm superficial lacerations noted in the folds between the labia minora and majora which are TTP, urethral meatus in normal anatomic position with no blood via urethral meatus.  Procedure: The patient was prepped and draped in usual sterile fashion. The patient was tender and expressed discomfort and a stinging sensation with betadine prep. Physical exam was notable for bilateral superficial lacerations to the vagina between the labia minora & majora, possibly from previous Foley catheterization attempts. 2% viscous lidocaine jelly was instilled via urethra. A 16Fr urethral Foley catheter was advanced into the bladder without any difficult with return of yellow urine on placement. The catheter was attached to a standing gravity bag and allowed to fully empty. In all 1425cc of yellow urine was drained from the bladder. A statlock was applied to the leg.  Recommendations: - Bacitracin BID to the bilateral vaginal lacerations until healed. Please perform this in hospital and give patient prescription on discharge - Continue Foley catheter for 5 days and  then perform a trial of void. If patient is discharged prior to 5 days she can either be taught how to remove the Foley catheter at home 5 days after placement at 6am, or call [facility] to request a visit with the RN clinic for Foley catheter removal. - If patient discharges with Foley catheter in place please ensure that she is taught how to remove Foley catheter at home (either cut and let water drain, or remove water from Foley balloon), knows how to transition between leg bag and larger 2L standing gravity bag, and is provided with leg bag and 2L standing gravity bag and alcohol swabs  Dr. Matilde Sprang was readily available. Urology will sign off. Please contact us if there are any further questions.  I performed a history and physical examination of the patient and discussed his management with the resident.  I reviewed the resident's note and agree with the documented findings and plan of care    Retention of unknown cause Patient willing to remove foley at home Monday am My name and phone number given

## 2016-07-30 NOTE — Progress Notes (Signed)
C/o back pain/ chronic, requesting usual pain med, Dr Fletcher Anon was paged orders followed.

## 2016-07-31 ENCOUNTER — Encounter (HOSPITAL_COMMUNITY): Payer: Self-pay | Admitting: Internal Medicine

## 2016-07-31 DIAGNOSIS — E1122 Type 2 diabetes mellitus with diabetic chronic kidney disease: Secondary | ICD-10-CM | POA: Diagnosis not present

## 2016-07-31 DIAGNOSIS — I739 Peripheral vascular disease, unspecified: Secondary | ICD-10-CM

## 2016-07-31 DIAGNOSIS — I70212 Atherosclerosis of native arteries of extremities with intermittent claudication, left leg: Secondary | ICD-10-CM | POA: Diagnosis not present

## 2016-07-31 DIAGNOSIS — I129 Hypertensive chronic kidney disease with stage 1 through stage 4 chronic kidney disease, or unspecified chronic kidney disease: Secondary | ICD-10-CM | POA: Diagnosis not present

## 2016-07-31 DIAGNOSIS — I7092 Chronic total occlusion of artery of the extremities: Secondary | ICD-10-CM | POA: Diagnosis not present

## 2016-07-31 LAB — BASIC METABOLIC PANEL
Anion gap: 12 (ref 5–15)
BUN: 9 mg/dL (ref 6–20)
CALCIUM: 9.2 mg/dL (ref 8.9–10.3)
CO2: 17 mmol/L — ABNORMAL LOW (ref 22–32)
Chloride: 102 mmol/L (ref 101–111)
Creatinine, Ser: 1.18 mg/dL — ABNORMAL HIGH (ref 0.44–1.00)
GFR calc Af Amer: 57 mL/min — ABNORMAL LOW (ref >60)
GFR, EST NON AFRICAN AMERICAN: 49 mL/min — AB (ref >60)
GLUCOSE: 323 mg/dL — AB (ref 65–99)
Potassium: 4.8 mmol/L (ref 3.5–5.1)
Sodium: 131 mmol/L — ABNORMAL LOW (ref 135–145)

## 2016-07-31 LAB — GLUCOSE, CAPILLARY
Glucose-Capillary: 234 mg/dL — ABNORMAL HIGH (ref 65–99)
Glucose-Capillary: 273 mg/dL — ABNORMAL HIGH (ref 65–99)
Glucose-Capillary: 283 mg/dL — ABNORMAL HIGH (ref 65–99)
Glucose-Capillary: 375 mg/dL — ABNORMAL HIGH (ref 65–99)
Glucose-Capillary: 376 mg/dL — ABNORMAL HIGH (ref 65–99)
Glucose-Capillary: 387 mg/dL — ABNORMAL HIGH (ref 65–99)

## 2016-07-31 LAB — CBC
HEMATOCRIT: 34 % — AB (ref 36.0–46.0)
Hemoglobin: 10.7 g/dL — ABNORMAL LOW (ref 12.0–15.0)
MCH: 22.8 pg — ABNORMAL LOW (ref 26.0–34.0)
MCHC: 31.5 g/dL (ref 30.0–36.0)
MCV: 72.3 fL — ABNORMAL LOW (ref 78.0–100.0)
Platelets: 343 10*3/uL (ref 150–400)
RBC: 4.7 MIL/uL (ref 3.87–5.11)
RDW: 16.4 % — AB (ref 11.5–15.5)
WBC: 9.7 10*3/uL (ref 4.0–10.5)

## 2016-07-31 MED ORDER — DOUBLE ANTIBIOTIC 500-10000 UNIT/GM EX OINT
TOPICAL_OINTMENT | Freq: Two times a day (BID) | CUTANEOUS | Status: DC
  Administered 2016-07-31 – 2016-08-01 (×2): via TOPICAL
  Filled 2016-07-31 (×17): qty 1

## 2016-07-31 MED ORDER — SODIUM CHLORIDE 0.9 % IV BOLUS (SEPSIS)
250.0000 mL | Freq: Once | INTRAVENOUS | Status: AC
  Administered 2016-07-30: 22:00:00 250 mL via INTRAVENOUS

## 2016-07-31 NOTE — Progress Notes (Signed)
femstop removed. No bleeding, bruising at site, or palp. Hematoma. bandaid applied. Bedrest 4 hours begins at 0100 ending at 0500. Pt aware and verbalizes understanding

## 2016-07-31 NOTE — Care Management Note (Signed)
Case Management Note  Patient Details  Name: Alejandra Hernandez MRN: [id number] Date of Birth: 05-Apr-1956  Subjective/Objective:  Patient lives with room mate and son, she is indep pta.  St/p pv intervention, will be on plavix, she has [employer] also ,no problem getting medications. She has a pcp and she has transport at dc.  No needs.                   Action/Plan:   Expected Discharge Date:                  Expected Discharge Plan:  Home/Self Care  In-House Referral:     Discharge planning Services  CM Consult  Post Acute Care Choice:    Choice offered to:     DME Arranged:    DME Agency:     HH Arranged:    HH Agency:     Status of Service:  Completed, signed off  If discussed at [employer] of Stay Meetings, dates discussed:    Additional Comments:  Zenon Mayo, RN 07/31/2016, 11:37 AM

## 2016-07-31 NOTE — Progress Notes (Signed)
Patient ID: Alejandra Hernandez, female   DOB: May 23, 1956, 60 y.o.   MRN: [id number]    Subjective:  Sore all over   Objective:  Vitals:   07/31/16 0230 07/31/16 0300 07/31/16 0330 07/31/16 0600  BP: (!) 136/49 (!) 140/57 (!) 146/52 (!) 158/55  Pulse:    73  Resp: 13 12 13 14   Temp:    98.7 F (37.1 C)  TempSrc:    Oral  SpO2:    99%  Weight:    70 kg (154 lb 5.2 oz)  Height:        Intake/Output from previous day:  Intake/Output Summary (Last 24 hours) at 07/31/16 [id number] Last data filed at 07/31/16 0810  Gross per 24 hour  Intake              630 ml  Output             2600 ml  Net            -1970 ml    Physical Exam: Affect appropriate Obese black female  HEENT: normal Neck supple with no adenopathy JVP normal no bruits no thyromegaly Lungs clear with no wheezing and good diaphragmatic motion Heart:  S1/S2 no murmur, no rub, gallop or click PMI normal Abdomen: benighn, BS positve, no tenderness, no AAA no bruit.  No HSM or HJR RFA no hematoma poor DP/PT both feet but warm  No edema Neuro non-focal Skin warm and dry No muscular weakness   Lab Results: Basic Metabolic Panel:  Recent Labs  07/30/16 1130 07/31/16 0520  NA 126* 131*  K 4.3 4.8  CL 95* 102  CO2 20* 17*  GLUCOSE 295* 323*  BUN 11 9  CREATININE 1.20* 1.18*  CALCIUM 9.1 9.2   Liver Function Tests: No results for input(s): AST, ALT, ALKPHOS, BILITOT, PROT, ALBUMIN in the last 72 hours. No results for input(s): LIPASE, AMYLASE in the last 72 hours. CBC:  Recent Labs  07/30/16 1036 07/31/16 0520  WBC 8.8 9.7  HGB 10.3* 10.7*  HCT 32.0* 34.0*  MCV 70.8* 72.3*  PLT 359 343    Imaging: No results found.  Cardiac Studies:  ECG:  SR rate 71 normal    Telemetry:  NSR 07/31/2016   Echo:   Medications:   . allopurinol  100 mg Oral BID  . amitriptyline  50 mg Oral QHS  . amLODipine  10 mg Oral Q supper  . aspirin EC  81 mg Oral Daily  . cloNIDine  0.2 mg Oral Daily  . clopidogrel   75 mg Oral Q breakfast  . ferrous sulfate  325 mg Oral BID WC  . insulin aspart  0-15 Units Subcutaneous TID WC  . insulin aspart  0-5 Units Subcutaneous QHS  . insulin glargine  26 Units Subcutaneous BID  . isosorbide mononitrate  15 mg Oral Daily  . lisinopril  20 mg Oral Daily  . pantoprazole  40 mg Oral BID AC  . rosuvastatin  40 mg Oral q1800  . spironolactone  12.5 mg Oral Daily       Assessment/Plan:  PVD:  Left SFA drug coated balloon angioplasty with residual right SFA dx Continue ASA/Plavix  F/u with Arida  HTN:  Well controlled.  Continue current medications and low sodium Dash type diet.   No significant RAS on angio  Urology:  Bladder retention foley to stay in for 5 days should see urology Monday  Needs to ambulate possible d/c latter today but more  likely in am    [name] 07/31/2016, 8:29 AM

## 2016-07-31 NOTE — Progress Notes (Signed)
Inpatient Diabetes Program Recommendations  AACE/ADA: New Consensus Statement on Inpatient Glycemic Control (2015)  Target Ranges:  Prepandial:   less than 140 mg/dL      Peak postprandial:   less than 180 mg/dL (1-2 hours)      Critically ill patients:  140 - 180 mg/dL   Lab Results  Component Value Date   GLUCAP 376 (H) 07/31/2016   HGBA1C 9.5 05/14/2016    Review of Glycemic Control:  Results for SANYAH, HULL (MRN [id number]) as of 07/31/2016 12:01  Ref. Range 07/30/2016 09:49 07/30/2016 17:39 07/30/2016 21:38 07/31/2016 06:06 07/31/2016 10:44  Glucose-Capillary Latest Ref Range: 65 - 99 mg/dL 341 (H) 231 (H) 251 (H) 283 (H) 376 (H)   Diabetes history: Type 2 diabetes Outpatient Diabetes medications: Lantus 26 units bid, Novolog 2 units bid Current orders for Inpatient glycemic control:  Lantus 26 units bid, Novolog moderate tid with meals  Inpatient Diabetes Program Recommendations:    Please consider adding Novolog meal coverage 5 units tid with meals.  Thanks, Adah Perl, RN, BC-ADM Inpatient Diabetes Coordinator Pager [phone] (8a-5p)

## 2016-08-01 ENCOUNTER — Encounter (HOSPITAL_COMMUNITY): Payer: Self-pay | Admitting: Cardiology

## 2016-08-01 DIAGNOSIS — I70212 Atherosclerosis of native arteries of extremities with intermittent claudication, left leg: Secondary | ICD-10-CM | POA: Diagnosis not present

## 2016-08-01 DIAGNOSIS — I739 Peripheral vascular disease, unspecified: Secondary | ICD-10-CM | POA: Diagnosis not present

## 2016-08-01 LAB — GLUCOSE, CAPILLARY
GLUCOSE-CAPILLARY: 328 mg/dL — AB (ref 65–99)
Glucose-Capillary: 272 mg/dL — ABNORMAL HIGH (ref 65–99)

## 2016-08-01 MED ORDER — LIVING WELL WITH DIABETES BOOK
Freq: Once | Status: AC
  Administered 2016-08-01: 12:00:00
  Filled 2016-08-01: qty 1

## 2016-08-01 MED ORDER — CLOPIDOGREL BISULFATE 75 MG PO TABS: 75.0000 mg | ORAL_TABLET | Freq: Every day | ORAL | 2 refills | Status: DC

## 2016-08-01 MED ORDER — DOCUSATE SODIUM 100 MG PO CAPS
100.0000 mg | ORAL_CAPSULE | Freq: Once | ORAL | Status: AC
  Administered 2016-08-01: 12:00:00 100 mg via ORAL
  Filled 2016-08-01: qty 1

## 2016-08-01 NOTE — Care Management Note (Signed)
Case Management Note  Patient Details  Name: Alejandra Hernandez MRN: [id number] Date of Birth: Dec 22, 1955  Subjective/Objective:     Patient lives with room mate and son, she is indep pta.  St/p pv intervention, will be on plavix, she has [employer] also ,no problem getting medications. She has a pcp and she has transport at dc.  Patient will go home with foley for a week and then will go to MD office for foley to be removed. She states she does not need HH for this she knows how to empty foley and it will be coming out in 7 days at MD office.  She is for dc today, no needs.                               Action/Plan:   Expected Discharge Date:                  Expected Discharge Plan:  Home/Self Care  In-House Referral:     Discharge planning Services  CM Consult  Post Acute Care Choice:    Choice offered to:     DME Arranged:    DME Agency:     HH Arranged:    HH Agency:     Status of Service:  Completed, signed off  If discussed at [employer] of Stay Meetings, dates discussed:    Additional Comments:  Zenon Mayo, RN 08/01/2016, 10:36 AM

## 2016-08-01 NOTE — Progress Notes (Signed)
Patient ID: Alejandra Hernandez, female   DOB: 1956/09/17, 60 y.o.   MRN: [id number]    Subjective:  Feeling better this morning.   Objective:  Vitals:   07/31/16 1632 07/31/16 1922 08/01/16 0052 08/01/16 0442  BP: (!) 159/54 (!) 152/46 126/67 (!) 132/52  Pulse: 72 83  61  Resp: 17 16 14 13   Temp: 97.5 F (36.4 C) 97.8 F (36.6 C)  97.5 F (36.4 C)  TempSrc: Oral Oral  Axillary  SpO2: 99% 99%  93%  Weight:    158 lb 8.2 oz (71.9 kg)  Height:        Intake/Output from previous day:  Intake/Output Summary (Last 24 hours) at 08/01/16 0654 Last data filed at 08/01/16 0448  Gross per 24 hour  Intake             1200 ml  Output             2350 ml  Net            -1150 ml    Physical Exam: Affect appropriate Obese black female  HEENT: normal Neck supple with no adenopathy JVP normal no bruits no thyromegaly Lungs clear with no wheezing and good diaphragmatic motion Heart:  S1/S2 no murmur, no rub, gallop or click PMI normal Abdomen: benighn, BS positve, no tenderness, no AAA no bruit.  No HSM or HJR RFA no hematoma poor DP/PT both feet but warm  No edema Neuro non-focal Skin warm and dry No muscular weakness   Lab Results: Basic Metabolic Panel:  Recent Labs  07/30/16 1130 07/31/16 0520  NA 126* 131*  K 4.3 4.8  CL 95* 102  CO2 20* 17*  GLUCOSE 295* 323*  BUN 11 9  CREATININE 1.20* 1.18*  CALCIUM 9.1 9.2   Liver Function Tests: No results for input(s): AST, ALT, ALKPHOS, BILITOT, PROT, ALBUMIN in the last 72 hours. No results for input(s): LIPASE, AMYLASE in the last 72 hours. CBC:  Recent Labs  07/30/16 1036 07/31/16 0520  WBC 8.8 9.7  HGB 10.3* 10.7*  HCT 32.0* 34.0*  MCV 70.8* 72.3*  PLT 359 343    Imaging: No results found.  Cardiac Studies:  ECG:  SR rate 71 normal    Telemetry:  NSR 08/01/2016   Medications:   . allopurinol  100 mg Oral BID  . amitriptyline  50 mg Oral QHS  . amLODipine  10 mg Oral Q supper  . aspirin EC  81 mg  Oral Daily  . cloNIDine  0.2 mg Oral Daily  . clopidogrel  75 mg Oral Q breakfast  . ferrous sulfate  325 mg Oral BID WC  . insulin aspart  0-15 Units Subcutaneous TID WC  . insulin aspart  0-5 Units Subcutaneous QHS  . insulin glargine  26 Units Subcutaneous BID  . isosorbide mononitrate  15 mg Oral Daily  . lisinopril  20 mg Oral Daily  . pantoprazole  40 mg Oral BID AC  . polymixin-bacitracin   Topical BID  . rosuvastatin  40 mg Oral q1800  . spironolactone  12.5 mg Oral Daily       Assessment/Plan:  PVD:  Left SFA drug coated balloon angioplasty with residual right SFA dx. Palpable pulse noted to the left foot post procedure. Labs stable post procedure.  --Continue ASA/Plavix  F/u with Fletcher Anon has been arranged.   HTN:  Well controlled.  Continue current medications and low sodium Dash type diet.   No significant RAS on  angio  Urology:  Bladder retention foley to stay in for 5 days should see urology Monday.   Reports attempting to ambulate yesterday but felt dizzy, and sore. Will need to ambulate today with plans for d/c home.   Reino Bellis NP-C 08/01/2016, 6:54 AM   Doing well no pain at RFA groin sight Foley in place Has outpatient f/u urology d/c on Monday Ok to d/c  Home today No pain in legs f/u Dr Fletcher Anon for PVD  [name]

## 2016-08-01 NOTE — Plan of Care (Signed)
Problem: Phase II Progression Outcomes Goal: Pain controlled with appropriate interventions Outcome: Progressing Continuous, due to chronic back pain. Pt reports has been controlled tonight.

## 2016-08-01 NOTE — Discharge Summary (Signed)
Discharge Summary    Patient ID: ADHITHI HOSPODAR,  MRN: [id number], DOB/AGE: 60-Sep-1957 60 y.o.  Admit date: 07/30/2016 Discharge date: 08/01/2016  Primary Care Provider: Dimas Chyle Primary Cardiologist: Dr. Danton Sewer. Fletcher Anon  Discharge Diagnoses    Principal Problem:   Claudication [facility]) Active Problems:   PAD (peripheral artery disease) ([place])   Allergies No Known Allergies  Diagnostic Studies/Procedures    Lower extremity angiogram/intervention: 07/30/16  Conclusion     Severe diffuse left superficial femoral artery disease, with up to 95% stenosis.  Severe left lower extremity runoff disease with chronic total occlusion of the anterior tibial, posterior tibial, and peroneal arteries. The peroneal artery reconstitutes via collaterals.  Successful directional atherectomy drug-coated balloon angioplasty of the left SFA with less than 10% residual stenosis.   Recommendations: 1. Admit for overnight extended recovery and hydration, given history of chronic kidney disease. 2. Dual antiplatelet therapy with aspirin ankle pedicle for at least one month. 3. If the patient continues to have right lower extremity claudication, can consider intervention to diffusely diseased right SFA. 4. Close outpatient follow-up with Dr. Fletcher Anon.    _____________   History of Present Illness     Alejandra Hernandez is a 60 y.o. female who was referred by Richardson Dopp for evaluation and management of peripheral arterial disease. She has known history of hypertension, hyperlipidemia, diabetes mellitus, carotid disease, GERD and tobacco use. She was hospitalized in March 2017 for symptomatic anemia with a hemoglobin of 6.2 in the setting of peptic ulcer disease. She was evaluated with a stress test in June for atypical chest pain. The stress test was normal. She is referred for claudication and abnormal Doppler study. She is known to have bilateral carotid disease. Carotid Doppler in July showed  60-79% right ICA stenosis and 40-59% left ICA stenosis. Noninvasive vascular studies showed an ABI of 0.73 on the right and 0.64 on the left. Duplex showed moderate iliac disease on the right, moderate right common femoral artery stenosis, significant right SFA stenosis and borderline significant left SFA stenosis. She reports bilateral calf pain after walking about half a block. The symptoms are worse on the right side than the left side. She also started having right thigh pain with walking recently. She suffers from left foot tendinitis and fasciitis. She also noted dark discoloration at the base of the left big toe. She reports having an infection affecting the big toenail with plans to remove that by Dr. Barkley Bruns.  Hospital Course     Consultants: Urology   She presented on 9/6 for PV intervention with Dr. Saunders Revel and Dr. Fletcher Anon showing diffuse left SFA with 95% stenosis with severe left lower extremity runoff disease with chronic total occlusion of the anterior tibial, posterior tibial and peroneal arteries. She had successful directional atherectomy drug-coated balloon angioplasty of the left SFA with less than 10% stenosis. She was admitted overnight for IV hydration given her hx of CKD. Plan is continue with ASA and Plavix for at least one month. Plan to intervene in the right SFA if she continues to have right lower extremity claudication.   Post procedure she was brought to [facility] for overnight observation. When sheath was pulled by the RN and the patient had recurrent bleeding that was thought to be arterial. Pressure was held and FemStop was placed by Dr. Wynonia Lawman who was the on call MD. Her blood pressure and heart rate were stable during this procedure. Femstop was removed per protocol and no further episodes  of bleeding noted. She also experienced acute urinary retention during the night, and ultimately required on urology MD to place foley and nursing staff's unsuccessful attempts.   The following  morning she reports feeling sore all over and weak after the previous events of the night. She attempted to ambulate but did not feel well enough to with with nursing staff. She was observed another night and plans to discharge the following day.   On 9/8 she was seen and assessed by Dr. Johnsie Cancel and determined stable for discharge home. She has follow up already arranged in the office on 9/12 with Dr. Fletcher Anon. Also has follow up arranged with urology on Monday for foley cath to be removed.  _____________  Discharge Vitals Blood pressure (!) 141/46, pulse 76, temperature 97.5 F (36.4 C), temperature source Oral, resp. rate 14, height 5\' 3"  (1.6 m), weight 158 lb 8.2 oz (71.9 kg), SpO2 93 %.  Filed Weights   07/30/16 0936 07/31/16 0600 08/01/16 0442  Weight: 158 lb (71.7 kg) 154 lb 5.2 oz (70 kg) 158 lb 8.2 oz (71.9 kg)    Labs & Radiologic Studies    CBC  Recent Labs  07/30/16 1036 07/31/16 0520  WBC 8.8 9.7  HGB 10.3* 10.7*  HCT 32.0* 34.0*  MCV 70.8* 72.3*  PLT 359 [id number]   Basic Metabolic Panel  Recent Labs  07/30/16 1130 07/31/16 0520  NA 126* 131*  K 4.3 4.8  CL 95* 102  CO2 20* 17*  GLUCOSE 295* 323*  BUN 11 9  CREATININE 1.20* 1.18*  CALCIUM 9.1 9.2   Liver Function Tests No results for input(s): AST, ALT, ALKPHOS, BILITOT, PROT, ALBUMIN in the last 72 hours. No results for input(s): LIPASE, AMYLASE in the last 72 hours. Cardiac Enzymes No results for input(s): CKTOTAL, CKMB, CKMBINDEX, TROPONINI in the last 72 hours. BNP Invalid input(s): POCBNP D-Dimer No results for input(s): DDIMER in the last 72 hours. Hemoglobin A1C No results for input(s): HGBA1C in the last 72 hours. Fasting Lipid Panel No results for input(s): CHOL, HDL, LDLCALC, TRIG, CHOLHDL, LDLDIRECT in the last 72 hours. Thyroid Function Tests No results for input(s): TSH, T4TOTAL, T3FREE, THYROIDAB in the last 72 hours.  Invalid input(s): FREET3 _____________   Disposition   Pt is being  discharged home today in good condition.  Follow-up Plans & Appointments    Follow-up Information    Kathlyn Sacramento, MD Follow up on 08/05/2016.   Specialty:  Cardiology Why:  10:15am for your hospital follow up with Dr. Collene Gobble information: [id number] [id number] [phone]          Discharge Instructions    Diet - low sodium heart healthy    Complete by:  As directed   Discharge instructions    Complete by:  As directed   Groin Site Care Refer to this sheet in the next few weeks. These instructions provide you with information on caring for yourself after your procedure. Your caregiver may also give you more specific instructions. Your treatment has been planned according to current medical practices, but problems sometimes occur. Call your caregiver if you have any problems or questions after your procedure. HOME CARE INSTRUCTIONS You may shower 24 hours after the procedure. Remove the bandage (dressing) and gently wash the site with plain soap and water. Gently pat the site dry.  Do not apply powder or lotion to the site.  Do not sit in a bathtub, swimming pool, or whirlpool  for 5 to 7 days.  No bending, squatting, or lifting anything over 10 pounds (4.5 kg) as directed by your caregiver.  Inspect the site at least twice daily.  Do not drive home if you are discharged the same day of the procedure. Have someone else drive you.  You may drive 24 hours after the procedure unless otherwise instructed by your caregiver.  What to expect: Any bruising will usually fade within 1 to 2 weeks.  Blood that collects in the tissue (hematoma) may be painful to the touch. It should usually decrease in size and tenderness within 1 to 2 weeks.  SEEK IMMEDIATE MEDICAL CARE IF: You have unusual pain at the groin site or down the affected leg.  You have redness, warmth, swelling, or pain at the groin site.  You have drainage (other than a small amount of blood  on the dressing).  You have chills.  You have a fever or persistent symptoms for more than 72 hours.  You have a fever and your symptoms suddenly get worse.  Your leg becomes pale, cool, tingly, or numb.  You have heavy bleeding from the site. Hold pressure on the site. .   Increase activity slowly    Complete by:  As directed      Discharge Medications   Current Discharge Medication List    START taking these medications   Details  clopidogrel (PLAVIX) 75 MG tablet Take 1 tablet (75 mg total) by mouth daily with breakfast. Qty: 30 tablet, Refills: 2      CONTINUE these medications which have NOT CHANGED   Details  allopurinol (ZYLOPRIM) 100 MG tablet Take 100 mg by mouth 2 (two) times daily.     ALPRAZolam (XANAX) 0.5 MG tablet Take 0.25 mg by mouth daily.     amitriptyline (ELAVIL) 25 MG tablet Take 50 mg by mouth at bedtime. Refills: 5    amLODipine (NORVASC) 10 MG tablet Take 10 mg by mouth daily with supper.     aspirin 81 MG tablet Take 81 mg by mouth daily.    cloNIDine (CATAPRES) 0.2 MG tablet Take 0.2 mg by mouth daily.    ferrous sulfate 325 (65 FE) MG tablet Take 325 mg by mouth 2 (two) times daily with a meal.    insulin aspart (NOVOLOG FLEXPEN) 100 UNIT/ML FlexPen Inject 2 Units into the skin 2 (two) times daily.    Insulin Glargine (LANTUS SOLOSTAR) 100 UNIT/ML Solostar Pen Inject 26 Units into the skin 2 (two) times daily. Per sliding scale.    isosorbide mononitrate (IMDUR) 30 MG 24 hr tablet Take 0.5 tablets (15 mg total) by mouth daily. Qty: 30 tablet, Refills: 6   Associated Diagnoses: Hypertensive heart disease without CHF    lisinopril (PRINIVIL,ZESTRIL) 20 MG tablet Take 20 mg by mouth daily. Refills: 0    oxyCODONE-acetaminophen (PERCOCET) 10-325 MG tablet Take 1 tablet by mouth every 6 (six) hours as needed for pain.     pantoprazole (PROTONIX) 40 MG tablet TAKE 1 TABLET BY MOUTH TWICE DAILY BEFORE A MEAL Qty: 60 tablet, Refills: 5      rosuvastatin (CRESTOR) 40 MG tablet Take 1 tablet (40 mg total) by mouth daily. Qty: 90 tablet, Refills: 3   Associated Diagnoses: Hypercholesteremia    spironolactone (ALDACTONE) 12.5 mg TABS tablet Take 12.5 mg by mouth daily.    glucose blood test strip Use as instructed Qty: 100 each, Refills: 12   Associated Diagnoses: Type 2 diabetes mellitus with complication, with long-term  current use of insulin (HCC)    nitroGLYCERIN (NITROSTAT) 0.4 MG SL tablet Place 1 tablet (0.4 mg total) under the tongue every 5 (five) minutes as needed for chest pain. Qty: 30 tablet, Refills: 0          Outstanding Labs/Studies   None  Duration of Discharge Encounter   Greater than 30 minutes including physician time.  Signed, Reino Bellis NP-C 08/01/2016, 9:34 AM

## 2016-08-01 NOTE — Progress Notes (Addendum)
Inpatient Diabetes Program Recommendations  AACE/ADA: New Consensus Statement on Inpatient Glycemic Control (2015)  Target Ranges:  Prepandial:   less than 140 mg/dL      Peak postprandial:   less than 180 mg/dL (1-2 hours)      Critically ill patients:  140 - 180 mg/dL   Lab Results  Component Value Date   GLUCAP 272 (H) 08/01/2016   HGBA1C 9.5 05/14/2016   Spoke with patient about diabetes and home regimen for diabetes control. Patient reports that she is followed by her PCP for diabetes management. Patient reports that she is taking Lantus once a day except when she gets steroids shots at [facility] and her glucose runs in the 600-700 range, then she takes it twice a day. She says her glucose "goes up and down." She last saw her PCP 2 months ago and had an A1c >10%. Patient reports that no changes were made with her insulin at the last office visit. Patient states that she checks her glucose 4 times per day and that it is usually in the 100's mg/dl in the morning and goes up in the 200-400's mg/dl by supper.  Discussed A1C results (9.5% on 05/14/16). Discussed glucose and A1C goals. Explained how hyperglycemia leads to damage within blood vessels which lead to the common complications seen with uncontrolled diabetes. Stressed to the patient the importance of improving glycemic control to prevent further complications from uncontrolled diabetes. Patient to follow up with PCP in help with adjusting her insulin safely to reach glucose goals.  Patient verbalized understanding of information discussed and has no further questions at this time related to diabetes.  Thanks, Tama Headings RN, MSN, [facility] Inpatient Diabetes Coordinator Team Pager [phone] (8a-5p)

## 2016-08-04 NOTE — Progress Notes (Signed)
   07/30/16 1014  Clinical Encounter Type  Visited With Patient  Visit Type Spiritual support;Psychological support  Referral From Nurse  Spiritual Encounters  Spiritual Needs Prayer;Emotional  Stress Factors  Patient Stress Factors Other (Comment) (Pt reflecting on procedures after admission)  Advance Directives (For Healthcare)  Does patient have an advance directive? No  Would patient like information on creating an advanced directive? No - patient declined information  Pt will be in next month for similar procedure other leg.

## 2016-08-05 ENCOUNTER — Encounter: Payer: Self-pay | Admitting: Cardiovascular Disease

## 2016-08-05 ENCOUNTER — Ambulatory Visit (INDEPENDENT_AMBULATORY_CARE_PROVIDER_SITE_OTHER): Payer: Medicare Other | Admitting: Cardiovascular Disease

## 2016-08-05 ENCOUNTER — Other Ambulatory Visit: Payer: Self-pay | Admitting: Cardiovascular Disease

## 2016-08-05 VITALS — BP 120/61 | HR 85 | Ht 63.0 in | Wt 152.0 lb

## 2016-08-05 DIAGNOSIS — I779 Disorder of arteries and arterioles, unspecified: Secondary | ICD-10-CM | POA: Diagnosis not present

## 2016-08-05 DIAGNOSIS — I739 Peripheral vascular disease, unspecified: Secondary | ICD-10-CM | POA: Diagnosis not present

## 2016-08-05 DIAGNOSIS — E785 Hyperlipidemia, unspecified: Secondary | ICD-10-CM | POA: Diagnosis not present

## 2016-08-05 DIAGNOSIS — I10 Essential (primary) hypertension: Secondary | ICD-10-CM

## 2016-08-05 MED ORDER — CIPROFLOXACIN HCL 500 MG PO TABS: 500.0000 mg | ORAL_TABLET | Freq: Two times a day (BID) | ORAL | 0 refills | Status: DC

## 2016-08-05 NOTE — Patient Instructions (Addendum)
Medication Instructions:  Your physician has recommended you make the following change in your medication:  1. START Cipro 500mg  take one tablet by mouth twice a day for 3 days  Labwork: No new orders.   Testing/Procedures: Your physician has requested that you have a lower extremity arterial doppler of LEFT LEG. This test is an ultrasound of the arteries in the legs. It looks at arterial blood flow in the legs. There are no restrictions or special instructions  Follow-Up: Your physician recommends that you schedule a follow-up appointment in: 3 MONTHS with Dr Fletcher Anon   Any Other Special Instructions Will Be Listed Below (If Applicable).     If you need a refill on your cardiac medications before your next appointment, please call your pharmacy.
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
## 2016-08-14 ENCOUNTER — Ambulatory Visit (HOSPITAL_COMMUNITY)
Admission: RE | Admit: 2016-08-14 | Discharge: 2016-08-14 | Disposition: A | Discharge status: Home / Self Care | Payer: Medicare Other | Source: Ambulatory Visit | Attending: Cardiovascular Disease | Admitting: Cardiovascular Disease

## 2016-08-14 DIAGNOSIS — I70292 Other atherosclerosis of native arteries of extremities, left leg: Secondary | ICD-10-CM | POA: Insufficient documentation

## 2016-08-14 DIAGNOSIS — Z955 Presence of coronary angioplasty implant and graft: Secondary | ICD-10-CM | POA: Diagnosis not present

## 2016-08-14 DIAGNOSIS — I739 Peripheral vascular disease, unspecified: Secondary | ICD-10-CM

## 2016-08-14 DIAGNOSIS — I743 Embolism and thrombosis of arteries of the lower extremities: Secondary | ICD-10-CM | POA: Insufficient documentation

## 2016-08-14 DIAGNOSIS — I998 Other disorder of circulatory system: Secondary | ICD-10-CM | POA: Diagnosis not present

## 2016-10-20 ENCOUNTER — Telehealth: Payer: Self-pay | Admitting: Cardiovascular Disease

## 2016-10-20 ENCOUNTER — Encounter: Payer: Self-pay | Admitting: Physician Assistant

## 2016-10-20 ENCOUNTER — Ambulatory Visit (INDEPENDENT_AMBULATORY_CARE_PROVIDER_SITE_OTHER): Payer: Medicare Other | Admitting: Physician Assistant

## 2016-10-20 VITALS — BP 132/54 | HR 92 | Ht 63.0 in | Wt 158.0 lb

## 2016-10-20 DIAGNOSIS — E1140 Type 2 diabetes mellitus with diabetic neuropathy, unspecified: Secondary | ICD-10-CM

## 2016-10-20 DIAGNOSIS — R0789 Other chest pain: Secondary | ICD-10-CM | POA: Diagnosis not present

## 2016-10-20 DIAGNOSIS — E785 Hyperlipidemia, unspecified: Secondary | ICD-10-CM

## 2016-10-20 DIAGNOSIS — I739 Peripheral vascular disease, unspecified: Secondary | ICD-10-CM | POA: Diagnosis not present

## 2016-10-20 DIAGNOSIS — I779 Disorder of arteries and arterioles, unspecified: Secondary | ICD-10-CM | POA: Diagnosis not present

## 2016-10-20 DIAGNOSIS — Z794 Long term (current) use of insulin: Secondary | ICD-10-CM

## 2016-10-20 DIAGNOSIS — I10 Essential (primary) hypertension: Secondary | ICD-10-CM | POA: Diagnosis not present

## 2016-10-20 MED ORDER — SPIRONOLACTONE 25 MG PO TABS: 12.5000 mg | ORAL_TABLET | Freq: Every day | ORAL | 3 refills | Status: DC

## 2016-10-20 NOTE — Telephone Encounter (Signed)
New Message   Pt call returning to speak with RN. Pt states she is experiencing swelling in both legs. Pt states she had stents placed in left leg. Pt is concerned. Please call back to discuss
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
## 2016-10-20 NOTE — Telephone Encounter (Signed)
Spoke to patient. She reports that she's had leg swelling bilaterally x 1 week. Swelling from calf to ankle in right and leg legs. She states she noticed last week, had hoped this would resolve over thanksgiving weekend & that it did not improve.  Pt denies new shortness of breath, though she does report a "hacking cough" w/o other URI symptoms.   Notes she had PV procedure w stents in left leg in September, and that they went in on right side at that time. Concerned because she has had right thigh swelling as well. She does report that initial procedure related swelling had completely resolved w/in a few days. Reiterates that all current swelling is new within past week.  She also expresses concern over burning when initiating urinary stream. She has a urology appt to investigate this and states this is an ongoing issue her PCP is aware of.  She has a f/u w Dr. Fletcher Anon on 12/12 that was previously scheduled. Pt amenable to interim APP visit today. I have scheduled her w Isaac Laud this morning at 11:00am. Pt aware of appt details.

## 2016-10-20 NOTE — Patient Instructions (Addendum)
Medication Instructions:  RESTART Spironolactone 12.5mg   Take HALF 25mg  tablet once a day  Labwork: Your physician recommends that you return for lab work in: [facility]  Testing/Procedures: NONE   Follow-Up: Your physician recommends that you schedule a follow-up appointment in: [place].  Any Other Special Instructions Will Be Listed Below (If Applicable).     If you need a refill on your cardiac medications before your next appointment, please call your pharmacy.

## 2016-10-21 LAB — CBC
HEMATOCRIT: 31.1 % — AB (ref 35.0–45.0)
HEMOGLOBIN: 9.5 g/dL — AB (ref 11.7–15.5)
MCH: 23.3 pg — ABNORMAL LOW (ref 27.0–33.0)
MCHC: 30.5 g/dL — AB (ref 32.0–36.0)
MCV: 76.2 fL — AB (ref 80.0–100.0)
MPV: 9.1 fL (ref 7.5–12.5)
Platelets: 349 10*3/uL (ref 140–400)
RBC: 4.08 MIL/uL (ref 3.80–5.10)
RDW: 16.8 % — AB (ref 11.0–15.0)
WBC: 11.9 10*3/uL — ABNORMAL HIGH (ref 3.8–10.8)

## 2016-10-21 NOTE — Telephone Encounter (Signed)
Seen in clinic yesterday

## 2016-10-23 ENCOUNTER — Other Ambulatory Visit: Payer: Self-pay | Admitting: *Deleted

## 2016-10-23 DIAGNOSIS — D582 Other hemoglobinopathies: Secondary | ICD-10-CM

## 2016-10-24 ENCOUNTER — Telehealth: Payer: Self-pay | Admitting: Cardiovascular Disease

## 2016-10-24 NOTE — Telephone Encounter (Signed)
Alejandra Hernandez is returning a call . Please call

## 2016-10-24 NOTE — Telephone Encounter (Addendum)
Returned call to patient and explained her lab results.  She will have it redrawn at her appointment on 11/04/16 with Dr Fletcher Anon

## 2016-11-03 ENCOUNTER — Other Ambulatory Visit: Payer: Self-pay | Admitting: Cardiology

## 2016-11-03 ENCOUNTER — Other Ambulatory Visit: Payer: Self-pay | Admitting: Family Medicine

## 2016-11-04 ENCOUNTER — Encounter: Payer: Self-pay | Admitting: Cardiovascular Disease

## 2016-11-04 ENCOUNTER — Ambulatory Visit (INDEPENDENT_AMBULATORY_CARE_PROVIDER_SITE_OTHER): Payer: Medicare Other | Admitting: Cardiovascular Disease

## 2016-11-04 VITALS — BP 158/82 | HR 82 | Ht 63.0 in | Wt 157.0 lb

## 2016-11-04 DIAGNOSIS — I779 Disorder of arteries and arterioles, unspecified: Secondary | ICD-10-CM | POA: Diagnosis not present

## 2016-11-04 DIAGNOSIS — E784 Other hyperlipidemia: Secondary | ICD-10-CM

## 2016-11-04 DIAGNOSIS — I739 Peripheral vascular disease, unspecified: Secondary | ICD-10-CM

## 2016-11-04 DIAGNOSIS — E7849 Other hyperlipidemia: Secondary | ICD-10-CM
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
## 2016-11-04 NOTE — Patient Instructions (Signed)
Medication Instructions:  Your physician has recommended you make the following change in your medication:  1. STOP Plavix (Clopidogrel)  Labwork: No new orders.   Testing/Procedures: No new orders   Follow-Up: Your physician recommends that you schedule a follow-up appointment in: 2 MONTHS with Dr Fletcher Anon   Any Other Special Instructions Will Be Listed Below (If Applicable).     If you need a refill on your cardiac medications before your next appointment, please call your pharmacy.

## 2016-12-24 ENCOUNTER — Telehealth: Payer: Self-pay | Admitting: Gastroenterology

## 2016-12-24 NOTE — Telephone Encounter (Signed)
Received referral to schedule patient an appointment for rectal bleeding. Patient saw Dr. Paulita Fujita in the hospital within the past year and states that the only reason why she is coming here is because she is being referred to our office. Patient denies seeing Dr. Paulita Fujita at his office. Dr. Silverio Decamp is Doc of the Day and records placed on her desk for review.

## 2016-12-25 ENCOUNTER — Encounter: Payer: Self-pay | Admitting: Gastroenterology

## 2016-12-25 DIAGNOSIS — I82409 Acute embolism and thrombosis of unspecified deep veins of unspecified lower extremity: Secondary | ICD-10-CM

## 2016-12-25 HISTORY — DX: Acute embolism and thrombosis of unspecified deep veins of unspecified lower extremity: I82.409

## 2016-12-25 NOTE — Telephone Encounter (Signed)
Dr. Silverio Decamp reviewed records and has accepted patient. Ok to schedule OV. Appointment scheduled.

## 2016-12-28 ENCOUNTER — Emergency Department (HOSPITAL_COMMUNITY): Payer: Medicare Other

## 2016-12-28 ENCOUNTER — Inpatient Hospital Stay (HOSPITAL_COMMUNITY)
Admission: EM | Admit: 2016-12-28 | Discharge: 2017-01-11 | DRG: 987 | Disposition: A | Discharge status: Home / Self Care | Payer: Medicare Other | Attending: Family Medicine | Admitting: Family Medicine

## 2016-12-28 ENCOUNTER — Encounter (HOSPITAL_COMMUNITY): Payer: Self-pay | Admitting: Emergency Medicine

## 2016-12-28 ENCOUNTER — Inpatient Hospital Stay (HOSPITAL_COMMUNITY): Payer: Medicare Other

## 2016-12-28 DIAGNOSIS — R042 Hemoptysis: Secondary | ICD-10-CM | POA: Diagnosis present

## 2016-12-28 DIAGNOSIS — Z981 Arthrodesis status: Secondary | ICD-10-CM

## 2016-12-28 DIAGNOSIS — K921 Melena: Secondary | ICD-10-CM | POA: Diagnosis not present

## 2016-12-28 DIAGNOSIS — I119 Hypertensive heart disease without heart failure: Secondary | ICD-10-CM | POA: Diagnosis present

## 2016-12-28 DIAGNOSIS — L89611 Pressure ulcer of right heel, stage 1: Secondary | ICD-10-CM | POA: Diagnosis not present

## 2016-12-28 DIAGNOSIS — F419 Anxiety disorder, unspecified: Secondary | ICD-10-CM | POA: Diagnosis present

## 2016-12-28 DIAGNOSIS — E1110 Type 2 diabetes mellitus with ketoacidosis without coma: Principal | ICD-10-CM | POA: Diagnosis present

## 2016-12-28 DIAGNOSIS — F39 Unspecified mood [affective] disorder: Secondary | ICD-10-CM | POA: Diagnosis present

## 2016-12-28 DIAGNOSIS — M109 Gout, unspecified: Secondary | ICD-10-CM | POA: Diagnosis present

## 2016-12-28 DIAGNOSIS — N179 Acute kidney failure, unspecified: Secondary | ICD-10-CM

## 2016-12-28 DIAGNOSIS — K219 Gastro-esophageal reflux disease without esophagitis: Secondary | ICD-10-CM | POA: Diagnosis present

## 2016-12-28 DIAGNOSIS — G9349 Other encephalopathy: Secondary | ICD-10-CM | POA: Diagnosis not present

## 2016-12-28 DIAGNOSIS — R200 Anesthesia of skin: Secondary | ICD-10-CM | POA: Diagnosis present

## 2016-12-28 DIAGNOSIS — R918 Other nonspecific abnormal finding of lung field: Secondary | ICD-10-CM | POA: Diagnosis present

## 2016-12-28 DIAGNOSIS — E11649 Type 2 diabetes mellitus with hypoglycemia without coma: Secondary | ICD-10-CM | POA: Diagnosis present

## 2016-12-28 DIAGNOSIS — E1311 Other specified diabetes mellitus with ketoacidosis with coma: Secondary | ICD-10-CM

## 2016-12-28 DIAGNOSIS — D638 Anemia in other chronic diseases classified elsewhere: Secondary | ICD-10-CM | POA: Diagnosis present

## 2016-12-28 DIAGNOSIS — E876 Hypokalemia: Secondary | ICD-10-CM | POA: Diagnosis present

## 2016-12-28 DIAGNOSIS — D62 Acute posthemorrhagic anemia: Secondary | ICD-10-CM | POA: Diagnosis present

## 2016-12-28 DIAGNOSIS — I2699 Other pulmonary embolism without acute cor pulmonale: Secondary | ICD-10-CM | POA: Diagnosis present

## 2016-12-28 DIAGNOSIS — Z9862 Peripheral vascular angioplasty status: Secondary | ICD-10-CM

## 2016-12-28 DIAGNOSIS — J101 Influenza due to other identified influenza virus with other respiratory manifestations: Secondary | ICD-10-CM | POA: Diagnosis present

## 2016-12-28 DIAGNOSIS — Z87891 Personal history of nicotine dependence: Secondary | ICD-10-CM

## 2016-12-28 DIAGNOSIS — J9601 Acute respiratory failure with hypoxia: Secondary | ICD-10-CM | POA: Diagnosis not present

## 2016-12-28 DIAGNOSIS — M19071 Primary osteoarthritis, right ankle and foot: Secondary | ICD-10-CM | POA: Diagnosis present

## 2016-12-28 DIAGNOSIS — R05 Cough: Secondary | ICD-10-CM | POA: Diagnosis present

## 2016-12-28 DIAGNOSIS — I248 Other forms of acute ischemic heart disease: Secondary | ICD-10-CM | POA: Diagnosis present

## 2016-12-28 DIAGNOSIS — N170 Acute kidney failure with tubular necrosis: Secondary | ICD-10-CM | POA: Diagnosis present

## 2016-12-28 DIAGNOSIS — Z6827 Body mass index (BMI) 27.0-27.9, adult: Secondary | ICD-10-CM

## 2016-12-28 DIAGNOSIS — I6529 Occlusion and stenosis of unspecified carotid artery: Secondary | ICD-10-CM | POA: Diagnosis present

## 2016-12-28 DIAGNOSIS — J189 Pneumonia, unspecified organism: Secondary | ICD-10-CM | POA: Diagnosis present

## 2016-12-28 DIAGNOSIS — E669 Obesity, unspecified: Secondary | ICD-10-CM | POA: Diagnosis present

## 2016-12-28 DIAGNOSIS — I2510 Atherosclerotic heart disease of native coronary artery without angina pectoris: Secondary | ICD-10-CM | POA: Diagnosis present

## 2016-12-28 DIAGNOSIS — R000 Tachycardia, unspecified: Secondary | ICD-10-CM | POA: Diagnosis present

## 2016-12-28 DIAGNOSIS — R338 Other retention of urine: Secondary | ICD-10-CM | POA: Diagnosis present

## 2016-12-28 DIAGNOSIS — R079 Chest pain, unspecified: Secondary | ICD-10-CM | POA: Diagnosis not present

## 2016-12-28 DIAGNOSIS — E785 Hyperlipidemia, unspecified: Secondary | ICD-10-CM | POA: Diagnosis present

## 2016-12-28 DIAGNOSIS — R9389 Abnormal findings on diagnostic imaging of other specified body structures: Secondary | ICD-10-CM | POA: Diagnosis present

## 2016-12-28 DIAGNOSIS — I10 Essential (primary) hypertension: Secondary | ICD-10-CM | POA: Diagnosis not present

## 2016-12-28 DIAGNOSIS — R011 Cardiac murmur, unspecified: Secondary | ICD-10-CM | POA: Diagnosis present

## 2016-12-28 DIAGNOSIS — E1165 Type 2 diabetes mellitus with hyperglycemia: Secondary | ICD-10-CM | POA: Diagnosis not present

## 2016-12-28 DIAGNOSIS — E1310 Other specified diabetes mellitus with ketoacidosis without coma: Secondary | ICD-10-CM

## 2016-12-28 DIAGNOSIS — E1151 Type 2 diabetes mellitus with diabetic peripheral angiopathy without gangrene: Secondary | ICD-10-CM | POA: Diagnosis present

## 2016-12-28 DIAGNOSIS — Z794 Long term (current) use of insulin: Secondary | ICD-10-CM

## 2016-12-28 DIAGNOSIS — J181 Lobar pneumonia, unspecified organism: Secondary | ICD-10-CM | POA: Diagnosis not present

## 2016-12-28 DIAGNOSIS — Z79899 Other long term (current) drug therapy: Secondary | ICD-10-CM

## 2016-12-28 DIAGNOSIS — Z7982 Long term (current) use of aspirin: Secondary | ICD-10-CM

## 2016-12-28 DIAGNOSIS — Z8711 Personal history of peptic ulcer disease: Secondary | ICD-10-CM

## 2016-12-28 DIAGNOSIS — Z79891 Long term (current) use of opiate analgesic: Secondary | ICD-10-CM

## 2016-12-28 DIAGNOSIS — J9600 Acute respiratory failure, unspecified whether with hypoxia or hypercapnia: Secondary | ICD-10-CM

## 2016-12-28 DIAGNOSIS — Z8379 Family history of other diseases of the digestive system: Secondary | ICD-10-CM

## 2016-12-28 DIAGNOSIS — R600 Localized edema: Secondary | ICD-10-CM

## 2016-12-28 DIAGNOSIS — R339 Retention of urine, unspecified: Secondary | ICD-10-CM | POA: Diagnosis present

## 2016-12-28 DIAGNOSIS — M1711 Unilateral primary osteoarthritis, right knee: Secondary | ICD-10-CM | POA: Diagnosis present

## 2016-12-28 DIAGNOSIS — R1310 Dysphagia, unspecified: Secondary | ICD-10-CM | POA: Diagnosis present

## 2016-12-28 DIAGNOSIS — Z8673 Personal history of transient ischemic attack (TIA), and cerebral infarction without residual deficits: Secondary | ICD-10-CM

## 2016-12-28 DIAGNOSIS — M479 Spondylosis, unspecified: Secondary | ICD-10-CM | POA: Diagnosis present

## 2016-12-28 DIAGNOSIS — E118 Type 2 diabetes mellitus with unspecified complications: Secondary | ICD-10-CM | POA: Diagnosis not present

## 2016-12-28 DIAGNOSIS — M79609 Pain in unspecified limb: Secondary | ICD-10-CM | POA: Diagnosis not present

## 2016-12-28 DIAGNOSIS — H53149 Visual discomfort, unspecified: Secondary | ICD-10-CM | POA: Diagnosis present

## 2016-12-28 DIAGNOSIS — R059 Cough, unspecified: Secondary | ICD-10-CM

## 2016-12-28 DIAGNOSIS — Z8249 Family history of ischemic heart disease and other diseases of the circulatory system: Secondary | ICD-10-CM

## 2016-12-28 DIAGNOSIS — M19072 Primary osteoarthritis, left ankle and foot: Secondary | ICD-10-CM | POA: Diagnosis present

## 2016-12-28 DIAGNOSIS — Z833 Family history of diabetes mellitus: Secondary | ICD-10-CM

## 2016-12-28 DIAGNOSIS — Z841 Family history of disorders of kidney and ureter: Secondary | ICD-10-CM

## 2016-12-28 DIAGNOSIS — G8929 Other chronic pain: Secondary | ICD-10-CM | POA: Diagnosis present

## 2016-12-28 LAB — GLUCOSE, CAPILLARY
GLUCOSE-CAPILLARY: 263 mg/dL — AB (ref 65–99)
GLUCOSE-CAPILLARY: 238 mg/dL — AB (ref 65–99)
GLUCOSE-CAPILLARY: 116 mg/dL — AB (ref 65–99)
Glucose-Capillary: 204 mg/dL — ABNORMAL HIGH (ref 65–99)
Glucose-Capillary: 170 mg/dL — ABNORMAL HIGH (ref 65–99)
Glucose-Capillary: 139 mg/dL — ABNORMAL HIGH (ref 65–99)

## 2016-12-28 LAB — BASIC METABOLIC PANEL
ANION GAP: 24 — AB (ref 5–15)
ANION GAP: 16 — AB (ref 5–15)
ANION GAP: 12 (ref 5–15)
BUN: 21 mg/dL — ABNORMAL HIGH (ref 6–20)
BUN: 21 mg/dL — AB (ref 6–20)
BUN: 19 mg/dL (ref 6–20)
CALCIUM: 8.6 mg/dL — AB (ref 8.9–10.3)
CHLORIDE: 90 mmol/L — AB (ref 101–111)
CO2: 12 mmol/L — ABNORMAL LOW (ref 22–32)
CO2: 16 mmol/L — ABNORMAL LOW (ref 22–32)
CO2: 16 mmol/L — AB (ref 22–32)
Calcium: 8.6 mg/dL — ABNORMAL LOW (ref 8.9–10.3)
Calcium: 8.4 mg/dL — ABNORMAL LOW (ref 8.9–10.3)
Chloride: 100 mmol/L — ABNORMAL LOW (ref 101–111)
Chloride: 105 mmol/L (ref 101–111)
Creatinine, Ser: 2.21 mg/dL — ABNORMAL HIGH (ref 0.44–1.00)
Creatinine, Ser: 2.05 mg/dL — ABNORMAL HIGH (ref 0.44–1.00)
Creatinine, Ser: 2.03 mg/dL — ABNORMAL HIGH (ref 0.44–1.00)
GFR calc Af Amer: 29 mL/min — ABNORMAL LOW (ref >60)
GFR calc Af Amer: 30 mL/min — ABNORMAL LOW (ref >60)
GFR calc non Af Amer: 23 mL/min — ABNORMAL LOW (ref >60)
GFR calc non Af Amer: 25 mL/min — ABNORMAL LOW (ref >60)
GFR calc non Af Amer: 25 mL/min — ABNORMAL LOW (ref >60)
GFR, EST AFRICAN AMERICAN: 27 mL/min — AB (ref >60)
GLUCOSE: 251 mg/dL — AB (ref 65–99)
GLUCOSE: 204 mg/dL — AB (ref 65–99)
Glucose, Bld: 512 mg/dL (ref 65–99)
POTASSIUM: 4.2 mmol/L (ref 3.5–5.1)
POTASSIUM: 3.7 mmol/L (ref 3.5–5.1)
Potassium: 4.7 mmol/L (ref 3.5–5.1)
SODIUM: 126 mmol/L — AB (ref 135–145)
Sodium: 132 mmol/L — ABNORMAL LOW (ref 135–145)
Sodium: 133 mmol/L — ABNORMAL LOW (ref 135–145)

## 2016-12-28 LAB — I-STAT TROPONIN, ED: Troponin i, poc: 0.03 ng/mL (ref 0.00–0.08)

## 2016-12-28 LAB — INFLUENZA PANEL BY PCR (TYPE A & B)
INFLAPCR: NEGATIVE
Influenza B By PCR: NEGATIVE

## 2016-12-28 LAB — MRSA PCR SCREENING: MRSA BY PCR: NEGATIVE

## 2016-12-28 LAB — LACTIC ACID, PLASMA
LACTIC ACID, VENOUS: 1.9 mmol/L (ref 0.5–1.9)
Lactic Acid, Venous: 2.4 mmol/L (ref 0.5–1.9)

## 2016-12-28 LAB — CBC
HEMATOCRIT: 30.6 % — AB (ref 36.0–46.0)
Hemoglobin: 9.6 g/dL — ABNORMAL LOW (ref 12.0–15.0)
MCH: 22.7 pg — ABNORMAL LOW (ref 26.0–34.0)
MCHC: 31.4 g/dL (ref 30.0–36.0)
MCV: 72.3 fL — AB (ref 78.0–100.0)
PLATELETS: 351 10*3/uL (ref 150–400)
RBC: 4.23 MIL/uL (ref 3.87–5.11)
RDW: 15.1 % (ref 11.5–15.5)
WBC: 31.9 10*3/uL — AB (ref 4.0–10.5)

## 2016-12-28 LAB — CBG MONITORING, ED
GLUCOSE-CAPILLARY: 518 mg/dL — AB (ref 65–99)
Glucose-Capillary: 508 mg/dL (ref 65–99)
Glucose-Capillary: 318 mg/dL — ABNORMAL HIGH (ref 65–99)

## 2016-12-28 LAB — I-STAT CG4 LACTIC ACID, ED
LACTIC ACID, VENOUS: 2.12 mmol/L — AB (ref 0.5–1.9)
Lactic Acid, Venous: 2.51 mmol/L (ref 0.5–1.9)

## 2016-12-28 LAB — D-DIMER, QUANTITATIVE: D-Dimer, Quant: >20 ug/mL-FEU — ABNORMAL HIGH (ref 0.00–0.50)

## 2016-12-28 LAB — MAGNESIUM: Magnesium: 1.4 mg/dL — ABNORMAL LOW (ref 1.7–2.4)

## 2016-12-28 LAB — PROCALCITONIN: Procalcitonin: 3.27 ng/mL

## 2016-12-28 LAB — TROPONIN I
TROPONIN I: 0.09 ng/mL — AB (ref <0.03)
TROPONIN I: 0.08 ng/mL — AB (ref <0.03)

## 2016-12-28 LAB — BETA-HYDROXYBUTYRIC ACID: Beta-Hydroxybutyric Acid: 1.39 mmol/L — ABNORMAL HIGH (ref 0.05–0.27)

## 2016-12-28 LAB — PHOSPHORUS: PHOSPHORUS: 2.8 mg/dL (ref 2.5–4.6)

## 2016-12-28 MED ORDER — SODIUM CHLORIDE 0.9 % IV BOLUS (SEPSIS)
500.0000 mL | Freq: Once | INTRAVENOUS | Status: AC
  Administered 2016-12-28: 500 mL via INTRAVENOUS

## 2016-12-28 MED ORDER — DEXTROSE 5 % IV SOLN
1.0000 g | INTRAVENOUS | Status: AC
  Administered 2016-12-28 – 2017-01-01 (×5): 1 g via INTRAVENOUS
  Filled 2016-12-28 (×6): qty 10

## 2016-12-28 MED ORDER — METOPROLOL TARTRATE 5 MG/5ML IV SOLN
5.0000 mg | Freq: Once | INTRAVENOUS | Status: AC
  Administered 2016-12-28: 5 mg via INTRAVENOUS
  Filled 2016-12-28: qty 5

## 2016-12-28 MED ORDER — ACETAMINOPHEN 500 MG PO TABS
1000.0000 mg | ORAL_TABLET | Freq: Once | ORAL | Status: AC
  Administered 2016-12-28: 1000 mg via ORAL
  Filled 2016-12-28: qty 2

## 2016-12-28 MED ORDER — SODIUM CHLORIDE 0.9 % IV SOLN
INTRAVENOUS | Status: DC
  Administered 2016-12-30 – 2017-01-07 (×12): via INTRAVENOUS

## 2016-12-28 MED ORDER — AMITRIPTYLINE HCL 25 MG PO TABS
50.0000 mg | ORAL_TABLET | Freq: Every day | ORAL | Status: DC
  Administered 2016-12-28 – 2016-12-29 (×2): 50 mg via ORAL
  Filled 2016-12-28 (×2): qty 2

## 2016-12-28 MED ORDER — ISOSORBIDE MONONITRATE ER 30 MG PO TB24
15.0000 mg | ORAL_TABLET | Freq: Every day | ORAL | Status: DC
Start: 2016-12-28 — End: 2017-01-11
  Administered 2016-12-28 – 2017-01-11 (×14): 15 mg via ORAL
  Filled 2016-12-28 (×14): qty 1

## 2016-12-28 MED ORDER — ACETAMINOPHEN 325 MG PO TABS
650.0000 mg | ORAL_TABLET | Freq: Four times a day (QID) | ORAL | Status: DC | PRN
  Administered 2016-12-29 – 2017-01-03 (×5): 650 mg via ORAL
  Filled 2016-12-28 (×5): qty 2

## 2016-12-28 MED ORDER — AZITHROMYCIN 250 MG PO TABS
250.0000 mg | ORAL_TABLET | Freq: Every day | ORAL | Status: AC
  Administered 2016-12-29 – 2017-01-01 (×4): 250 mg via ORAL
  Filled 2016-12-28 (×4): qty 1

## 2016-12-28 MED ORDER — TECHNETIUM TO 99M ALBUMIN AGGREGATED
4.0000 | Freq: Once | INTRAVENOUS | Status: AC | PRN
  Administered 2016-12-28: 4 via INTRAVENOUS

## 2016-12-28 MED ORDER — AMLODIPINE BESYLATE 10 MG PO TABS
10.0000 mg | ORAL_TABLET | Freq: Every day | ORAL | Status: DC
  Administered 2016-12-28 – 2017-01-09 (×13): 10 mg via ORAL
  Filled 2016-12-28 (×13): qty 1

## 2016-12-28 MED ORDER — DEXTROSE-NACL 5-0.45 % IV SOLN: INTRAVENOUS | Status: DC

## 2016-12-28 MED ORDER — SODIUM CHLORIDE 0.9 % IV SOLN
INTRAVENOUS | Status: AC
  Administered 2016-12-28: 13:00:00 via INTRAVENOUS

## 2016-12-28 MED ORDER — SODIUM CHLORIDE 0.9 % IV SOLN
INTRAVENOUS | Status: DC
  Filled 2016-12-28: qty 2.5

## 2016-12-28 MED ORDER — PANTOPRAZOLE SODIUM 40 MG PO TBEC
40.0000 mg | DELAYED_RELEASE_TABLET | Freq: Every day | ORAL | Status: DC
Start: 2016-12-28 — End: 2017-01-11
  Administered 2016-12-28 – 2017-01-10 (×14): 40 mg via ORAL
  Filled 2016-12-28 (×16): qty 1

## 2016-12-28 MED ORDER — SODIUM CHLORIDE 0.9 % IV SOLN
INTRAVENOUS | Status: DC
  Administered 2016-12-28: 4.5 [IU]/h via INTRAVENOUS
  Filled 2016-12-28: qty 2.5

## 2016-12-28 MED ORDER — DEXTROSE 5 % IV SOLN
500.0000 mg | Freq: Once | INTRAVENOUS | Status: AC
  Administered 2016-12-28: 500 mg via INTRAVENOUS
  Filled 2016-12-28: qty 500

## 2016-12-28 MED ORDER — DEXTROSE-NACL 5-0.45 % IV SOLN
INTRAVENOUS | Status: DC
  Administered 2016-12-28: 19:00:00 via INTRAVENOUS

## 2016-12-28 MED ORDER — ROSUVASTATIN CALCIUM 10 MG PO TABS
40.0000 mg | ORAL_TABLET | Freq: Every day | ORAL | Status: DC
  Administered 2016-12-28 – 2017-01-11 (×14): 40 mg via ORAL
  Filled 2016-12-28: qty 1
  Filled 2016-12-28 (×2): qty 4
  Filled 2016-12-28: qty 1
  Filled 2016-12-28 (×4): qty 4
  Filled 2016-12-28: qty 1
  Filled 2016-12-28 (×5): qty 4

## 2016-12-28 MED ORDER — OXYCODONE-ACETAMINOPHEN 5-325 MG PO TABS
1.0000 | ORAL_TABLET | Freq: Four times a day (QID) | ORAL | Status: DC | PRN
  Administered 2016-12-28 – 2017-01-09 (×32): 1 via ORAL
  Filled 2016-12-28 (×32): qty 1

## 2016-12-28 MED ORDER — ALLOPURINOL 100 MG PO TABS
100.0000 mg | ORAL_TABLET | Freq: Two times a day (BID) | ORAL | Status: DC
  Administered 2016-12-28 – 2017-01-01 (×8): 100 mg via ORAL
  Filled 2016-12-28 (×8): qty 1

## 2016-12-28 MED ORDER — TECHNETIUM TC 99M DIETHYLENETRIAME-PENTAACETIC ACID: 30.0000 | Freq: Once | INTRAVENOUS | Status: DC | PRN

## 2016-12-28 MED ORDER — METOPROLOL TARTRATE 12.5 MG HALF TABLET: 12.5000 mg | ORAL_TABLET | Freq: Once | ORAL | Status: DC

## 2016-12-28 MED ORDER — SODIUM CHLORIDE 0.9 % IV SOLN
INTRAVENOUS | Status: DC
  Administered 2016-12-28: 12:00:00 via INTRAVENOUS

## 2016-12-28 MED ORDER — PROCHLORPERAZINE MALEATE 5 MG PO TABS
5.0000 mg | ORAL_TABLET | Freq: Four times a day (QID) | ORAL | Status: DC | PRN
  Administered 2016-12-29: 5 mg via ORAL
  Filled 2016-12-28 (×2): qty 1

## 2016-12-28 MED ORDER — SODIUM CHLORIDE 0.9 % IV BOLUS (SEPSIS)
1000.0000 mL | Freq: Once | INTRAVENOUS | Status: AC
  Administered 2016-12-28: 1000 mL via INTRAVENOUS

## 2016-12-28 MED ORDER — MAGNESIUM SULFATE 4 GM/100ML IV SOLN
4.0000 g | Freq: Once | INTRAVENOUS | Status: AC
  Administered 2016-12-28: 4 g via INTRAVENOUS
  Filled 2016-12-28: qty 100

## 2016-12-28 MED ORDER — DEXTROSE 5 % IV SOLN
1.0000 g | Freq: Once | INTRAVENOUS | Status: AC
  Administered 2016-12-28: 1 g via INTRAVENOUS
  Filled 2016-12-28: qty 10
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
## 2016-12-28 NOTE — Progress Notes (Addendum)
CRITICAL VALUE ALERT  Critical value received:  Trop 0.09  Date of notification:  12/28/16  Time of notification:  [id number]  Critical value read back:Yes.    Nurse who received alert:  Dwaine Gale, RN  MD notified (1st page):  [facility] on call  Time of first page:  [phone]  MD notified (2nd page):  Time of second page:  Responding MD:  Dr. Burr Medico  Time MD responded:  [place], Lonni Fix, RN.

## 2016-12-28 NOTE — ED Triage Notes (Signed)
PT has been having CP since Wednesday. N/V since last night. Pt started feeling SOB this morning. Nausea has subsided. EMS gave 324 mg of ASA and 1 nitro. No relief from pain. Pain 8/10 Pt has had a cough for several days. Initial BP 150/70, after nitro dropped to 85 systolic. EMS started fluid, BP improved to [id number] systolic. CBG 485.

## 2016-12-28 NOTE — Progress Notes (Signed)
Pt temp 102.7 and 102.8 with recheck. MD paged order tylenol. Pt on her way to nuclear med. MD stated tylenol can be given when pt returns. MD made aware of patient seeing and hearing things that are not there, MD spoke with patient on the phone, asked orientation question and pt answered appropriately. MD stated to continue monitor pt. Information passed on to on-coming RN.

## 2016-12-28 NOTE — Progress Notes (Signed)
CRITICAL VALUE ALERT  Critical value received:  Lactic 2.4  Date of notification: 12/28/16  Time of notification:  2246  Critical value read back:Yes.    Nurse who received alert:  Elvis Coil  MD notified (1st page):  On call family medicine  Time of first page: 2247  Responding MD:  On call family medicine  Time MD responded: 2247

## 2016-12-28 NOTE — ED Notes (Signed)
CRITICAL VALUE ALERT  Critical value received:  Blood sugar 512  Date of notification:  12/28/2016  Time of notification:  [id number]  Critical value read back:Yes.    Nurse who received alert:  Leodis Rains  MD notified (1st page):  Dr. Ashok Cordia 1100 no further orders at this time

## 2016-12-28 NOTE — ED Notes (Signed)
cbg 518

## 2016-12-28 NOTE — Progress Notes (Signed)
Late Entry  Patient received scheduled medications at 1815 and then vomited at 1830. HR sustaining 120-130's at this time. Dr. Burr Medico notified. Informed her patient likely vomited her pills up and would likely need something IV to control her heart rate. Waiting on new orders.   Joellen Jersey, RN.
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
## 2016-12-28 NOTE — Progress Notes (Signed)
Attending Brief Admission Note  Patient seen and examined at around 3:30pm. Briefly, 61 y.o. female with history of type 2 diabetes, hyperlipidemia, hypertension, carotid & peripheral arterial disease, presenting with generalized weakness, cough, hemoptysis, subjective fevers. Found to be in DKA with AG of 24 and CO2 of 12, blood sugar 512. Also with AKI, and opacity on CXR concerning for malignancy.  Plan: 1. DKA - IV fluids, IV insulin, trend BMETs, check beta hydroxybutyric acid  2. Cough, hemoptysis subjective fevers - agree with coverage for CAP with CTX & azithro. Check flu & respiratory viral panel. Consider PE - endorses unilateral leg swelling one week ago. Legs normal on my exam. No CTA right now due to renal function. Question utility of VQ scan at present given known likely solid lesion in lung. Will check dopplers & d-dimer. Cycle troponins due to report of chest pain. Trend lactate.  3. Opacity on CXR - once AKI resolved, attempt CT chest with contrast to further evaluate  Will cosign resident H&P when it is available.  Chrisandra Netters, MD [place]

## 2016-12-28 NOTE — ED Notes (Signed)
Pt's CBG result was 518. Informed Cindy - RN and [place].

## 2016-12-29 ENCOUNTER — Inpatient Hospital Stay (HOSPITAL_COMMUNITY): Payer: Medicare Other

## 2016-12-29 DIAGNOSIS — J189 Pneumonia, unspecified organism: Secondary | ICD-10-CM

## 2016-12-29 DIAGNOSIS — R059 Cough, unspecified: Secondary | ICD-10-CM | POA: Insufficient documentation

## 2016-12-29 DIAGNOSIS — I10 Essential (primary) hypertension: Secondary | ICD-10-CM

## 2016-12-29 DIAGNOSIS — R05 Cough: Secondary | ICD-10-CM

## 2016-12-29 DIAGNOSIS — N179 Acute kidney failure, unspecified: Secondary | ICD-10-CM

## 2016-12-29 DIAGNOSIS — R079 Chest pain, unspecified: Secondary | ICD-10-CM

## 2016-12-29 DIAGNOSIS — M79609 Pain in unspecified limb: Secondary | ICD-10-CM

## 2016-12-29 DIAGNOSIS — E1311 Other specified diabetes mellitus with ketoacidosis with coma: Secondary | ICD-10-CM

## 2016-12-29 LAB — CBC
HEMATOCRIT: 32.6 % — AB (ref 36.0–46.0)
HEMOGLOBIN: 10.6 g/dL — AB (ref 12.0–15.0)
MCH: 23.3 pg — ABNORMAL LOW (ref 26.0–34.0)
MCHC: 32.5 g/dL (ref 30.0–36.0)
MCV: 71.8 fL — AB (ref 78.0–100.0)
Platelets: 305 10*3/uL (ref 150–400)
RBC: 4.54 MIL/uL (ref 3.87–5.11)
RDW: 15.2 % (ref 11.5–15.5)
WBC: 29.6 10*3/uL — ABNORMAL HIGH (ref 4.0–10.5)

## 2016-12-29 LAB — BASIC METABOLIC PANEL
ANION GAP: 10 (ref 5–15)
ANION GAP: 7 (ref 5–15)
ANION GAP: 12 (ref 5–15)
Anion gap: 15 (ref 5–15)
BUN: 17 mg/dL (ref 6–20)
BUN: 18 mg/dL (ref 6–20)
BUN: 19 mg/dL (ref 6–20)
BUN: 17 mg/dL (ref 6–20)
CALCIUM: 8.2 mg/dL — AB (ref 8.9–10.3)
CALCIUM: 8.5 mg/dL — AB (ref 8.9–10.3)
CALCIUM: 8.3 mg/dL — AB (ref 8.9–10.3)
CALCIUM: 8.6 mg/dL — AB (ref 8.9–10.3)
CHLORIDE: 102 mmol/L (ref 101–111)
CO2: 16 mmol/L — ABNORMAL LOW (ref 22–32)
CO2: 18 mmol/L — ABNORMAL LOW (ref 22–32)
CO2: 18 mmol/L — ABNORMAL LOW (ref 22–32)
CO2: 16 mmol/L — ABNORMAL LOW (ref 22–32)
CREATININE: 2.33 mg/dL — AB (ref 0.44–1.00)
CREATININE: 2.04 mg/dL — AB (ref 0.44–1.00)
CREATININE: 2.49 mg/dL — AB (ref 0.44–1.00)
Chloride: 106 mmol/L (ref 101–111)
Chloride: 105 mmol/L (ref 101–111)
Chloride: 108 mmol/L (ref 101–111)
Creatinine, Ser: 2.27 mg/dL — ABNORMAL HIGH (ref 0.44–1.00)
GFR calc Af Amer: 29 mL/min — ABNORMAL LOW (ref >60)
GFR calc Af Amer: 26 mL/min — ABNORMAL LOW (ref >60)
GFR calc non Af Amer: 20 mL/min — ABNORMAL LOW (ref >60)
GFR, EST AFRICAN AMERICAN: 25 mL/min — AB (ref >60)
GFR, EST AFRICAN AMERICAN: 23 mL/min — AB (ref >60)
GFR, EST NON AFRICAN AMERICAN: 22 mL/min — AB (ref >60)
GFR, EST NON AFRICAN AMERICAN: 22 mL/min — AB (ref >60)
GFR, EST NON AFRICAN AMERICAN: 25 mL/min — AB (ref >60)
GLUCOSE: 146 mg/dL — AB (ref 65–99)
Glucose, Bld: 171 mg/dL — ABNORMAL HIGH (ref 65–99)
Glucose, Bld: 120 mg/dL — ABNORMAL HIGH (ref 65–99)
Glucose, Bld: 191 mg/dL — ABNORMAL HIGH (ref 65–99)
POTASSIUM: 3.5 mmol/L (ref 3.5–5.1)
Potassium: 3.7 mmol/L (ref 3.5–5.1)
Potassium: 3.6 mmol/L (ref 3.5–5.1)
Potassium: 3.9 mmol/L (ref 3.5–5.1)
Sodium: 134 mmol/L — ABNORMAL LOW (ref 135–145)
Sodium: 133 mmol/L — ABNORMAL LOW (ref 135–145)
Sodium: 133 mmol/L — ABNORMAL LOW (ref 135–145)
Sodium: 133 mmol/L — ABNORMAL LOW (ref 135–145)

## 2016-12-29 LAB — CREATININE, URINE, RANDOM: CREATININE, URINE: 45.47 mg/dL

## 2016-12-29 LAB — HEMOGLOBIN A1C
Hgb A1c MFr Bld: >15.5 % — ABNORMAL HIGH (ref 4.8–5.6)
Mean Plasma Glucose: >398 mg/dL

## 2016-12-29 LAB — GLUCOSE, CAPILLARY
GLUCOSE-CAPILLARY: 304 mg/dL — AB (ref 65–99)
GLUCOSE-CAPILLARY: 191 mg/dL — AB (ref 65–99)
GLUCOSE-CAPILLARY: 185 mg/dL — AB (ref 65–99)
GLUCOSE-CAPILLARY: 132 mg/dL — AB (ref 65–99)
GLUCOSE-CAPILLARY: 116 mg/dL — AB (ref 65–99)
GLUCOSE-CAPILLARY: 139 mg/dL — AB (ref 65–99)
GLUCOSE-CAPILLARY: 180 mg/dL — AB (ref 65–99)
GLUCOSE-CAPILLARY: 261 mg/dL — AB (ref 65–99)
GLUCOSE-CAPILLARY: 323 mg/dL — AB (ref 65–99)
Glucose-Capillary: 157 mg/dL — ABNORMAL HIGH (ref 65–99)
Glucose-Capillary: 173 mg/dL — ABNORMAL HIGH (ref 65–99)
Glucose-Capillary: 217 mg/dL — ABNORMAL HIGH (ref 65–99)
Glucose-Capillary: 259 mg/dL — ABNORMAL HIGH (ref 65–99)
Glucose-Capillary: 281 mg/dL — ABNORMAL HIGH (ref 65–99)
Glucose-Capillary: 416 mg/dL — ABNORMAL HIGH (ref 65–99)

## 2016-12-29 LAB — TROPONIN I
TROPONIN I: 0.22 ng/mL — AB (ref <0.03)
TROPONIN I: 0.14 ng/mL — AB (ref <0.03)
Troponin I: 0.07 ng/mL (ref <0.03)
Troponin I: 0.08 ng/mL (ref <0.03)

## 2016-12-29 LAB — URINALYSIS, ROUTINE W REFLEX MICROSCOPIC
Bilirubin Urine: NEGATIVE
Glucose, UA: >=500 mg/dL — AB
KETONES UR: 5 mg/dL — AB
Leukocytes, UA: NEGATIVE
Nitrite: NEGATIVE
PROTEIN: 100 mg/dL — AB
Specific Gravity, Urine: 1.008 (ref 1.005–1.030)
pH: 6 (ref 5.0–8.0)

## 2016-12-29 LAB — MAGNESIUM: Magnesium: 3.1 mg/dL — ABNORMAL HIGH (ref 1.7–2.4)

## 2016-12-29 LAB — SODIUM, URINE, RANDOM: Sodium, Ur: 73 mmol/L

## 2016-12-29 MED ORDER — INSULIN GLARGINE 100 UNIT/ML ~~LOC~~ SOLN
15.0000 [IU] | Freq: Once | SUBCUTANEOUS | Status: AC
  Administered 2016-12-29: 15 [IU] via SUBCUTANEOUS
  Filled 2016-12-29: qty 0.15

## 2016-12-29 MED ORDER — INSULIN ASPART 100 UNIT/ML ~~LOC~~ SOLN: 0.0000 [IU] | Freq: Three times a day (TID) | SUBCUTANEOUS | Status: DC

## 2016-12-29 MED ORDER — INSULIN GLARGINE 100 UNIT/ML ~~LOC~~ SOLN
15.0000 [IU] | Freq: Two times a day (BID) | SUBCUTANEOUS | Status: DC
Start: 2016-12-29 — End: 2016-12-30
  Administered 2016-12-29: 15 [IU] via SUBCUTANEOUS
  Filled 2016-12-29 (×2): qty 0.15

## 2016-12-29 MED ORDER — GUAIFENESIN-DM 100-10 MG/5ML PO SYRP
5.0000 mL | ORAL_SOLUTION | ORAL | Status: DC | PRN
  Administered 2016-12-29 – 2017-01-04 (×2): 5 mL via ORAL
  Filled 2016-12-29 (×2): qty 5

## 2016-12-29 MED ORDER — POTASSIUM CHLORIDE CRYS ER 20 MEQ PO TBCR
40.0000 meq | EXTENDED_RELEASE_TABLET | Freq: Three times a day (TID) | ORAL | Status: AC
  Administered 2016-12-29 (×3): 40 meq via ORAL
  Filled 2016-12-29 (×3): qty 2

## 2016-12-29 MED ORDER — INSULIN ASPART 100 UNIT/ML ~~LOC~~ SOLN
0.0000 [IU] | Freq: Every day | SUBCUTANEOUS | Status: DC
  Administered 2016-12-29: 3 [IU] via SUBCUTANEOUS

## 2016-12-29 MED ORDER — INSULIN GLARGINE 100 UNIT/ML ~~LOC~~ SOLN: 15.0000 [IU] | Freq: Every day | SUBCUTANEOUS | Status: DC

## 2016-12-29 MED ORDER — INSULIN ASPART 100 UNIT/ML ~~LOC~~ SOLN
0.0000 [IU] | Freq: Three times a day (TID) | SUBCUTANEOUS | Status: DC
  Administered 2016-12-29: 11 [IU] via SUBCUTANEOUS
  Administered 2016-12-29: 8 [IU] via SUBCUTANEOUS
  Administered 2016-12-30: 5 [IU] via SUBCUTANEOUS
  Administered 2016-12-30: 8 [IU] via SUBCUTANEOUS
  Administered 2016-12-30: 11 [IU] via SUBCUTANEOUS
  Administered 2016-12-31: 5 [IU] via SUBCUTANEOUS
  Administered 2016-12-31 (×2): 3 [IU] via SUBCUTANEOUS

## 2016-12-29 MED ORDER — ONDANSETRON HCL 4 MG PO TABS: 4.0000 mg | ORAL_TABLET | Freq: Three times a day (TID) | ORAL | Status: DC | PRN

## 2016-12-29 NOTE — Progress Notes (Signed)
VASCULAR LAB PRELIMINARY  PRELIMINARY  PRELIMINARY  PRELIMINARY  Bilateral lower extremity venous duplex completed.    Preliminary report:  There is no DVT or SVT noted in the bilateral lower extremities.   Dandra Velardi, RVT 12/29/2016, 3:26 PM

## 2016-12-29 NOTE — Progress Notes (Signed)
Inpatient Diabetes Program Recommendations  AACE/ADA: New Consensus Statement on Inpatient Glycemic Control (2015)  Target Ranges:  Prepandial:   less than 140 mg/dL      Peak postprandial:   less than 180 mg/dL (1-2 hours)      Critically ill patients:  140 - 180 mg/dL   Lab Results  Component Value Date   GLUCAP 180 (H) 12/29/2016   HGBA1C 9.5 05/14/2016    Review of Glycemic Control  Diabetes history:     DM2, DKA, GFR=20 Outpatient Diabetes medications:     Lantus 30 units BID,     Humalog 3-5 units BID, patient states if she eats TID, she "usually" covers this meal also Current orders for Inpatient glycemic control:     Sensitive correction scale Novolog 0-9 units TIDAC    Lantus 15 units given this am per 1 time dose  Inpatient Diabetes Program Recommendations:     Please consider Lantus 20 units BID and Meal coverage of Novolog 4 units TIDAC if patient eats > 50% of meal.  Note: Spoke with patient at bedside regarding self-management of diabetes  1.  Discussed with patient the following (teach back and/or return demonstration):  Medications at D/C (what these are, why taking, when taking, how taking, common S.E.'s)  CBG monitoring, A1C (what these are and why important)  Carb modified diet  2.  Identified barriers and facilitators to self-management goals:  Patient has multiple disease processes that affect her ability to manage DM,  Patient was able to teach back information about medications, importance of CBG's, what CBG's have been running  Needs follow up regarding home management of DM (specifically - patient states that CBG's have been in 400's to 500's over last month and was not sure how to change insulin doses to lower CBG's consistently)  3.  Identified support systems:   None mentioned     Will follow A1C results.  Thank you,  Windy Carina, RN, MSN Diabetes Coordinator Inpatient Diabetes Program [phone] (Team Pager)

## 2016-12-29 NOTE — Progress Notes (Signed)
Paged FMTS on-call to convey pt's VQ Scan results- low probability of PE- no orders received. Will continue to monitor.
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
## 2016-12-29 NOTE — Care Management Note (Signed)
Case Management Note  Patient Details  Name: ZAMYRIAH POLLEY MRN: [id number] Date of Birth: 03-16-1956  Subjective/Objective:   Presents with DKA, CAP, elevated lactic acid, aki, elevated trops-demand ischemia, htn, anemia, pad, and chronic back pain,  Await pt eval. NCM will cont to follow for dc needs.                Action/Plan:   Expected Discharge Date:                  Expected Discharge Plan:  Home/Self Care  In-House Referral:     Discharge planning Services  CM Consult  Post Acute Care Choice:    Choice offered to:     DME Arranged:    DME Agency:     HH Arranged:    HH Agency:     Status of Service:  In process, will continue to follow  If discussed at Long Length of Stay Meetings, dates discussed:    Additional Comments:  Zenon Mayo, RN 12/29/2016, 3:00 PM
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
## 2016-12-30 DIAGNOSIS — E118 Type 2 diabetes mellitus with unspecified complications: Secondary | ICD-10-CM

## 2016-12-30 DIAGNOSIS — Z794 Long term (current) use of insulin: Secondary | ICD-10-CM

## 2016-12-30 DIAGNOSIS — R338 Other retention of urine: Secondary | ICD-10-CM | POA: Diagnosis present

## 2016-12-30 LAB — GLUCOSE, CAPILLARY
GLUCOSE-CAPILLARY: 277 mg/dL — AB (ref 65–99)
Glucose-Capillary: 303 mg/dL — ABNORMAL HIGH (ref 65–99)
Glucose-Capillary: 216 mg/dL — ABNORMAL HIGH (ref 65–99)
Glucose-Capillary: 179 mg/dL — ABNORMAL HIGH (ref 65–99)

## 2016-12-30 LAB — BASIC METABOLIC PANEL
ANION GAP: 14 (ref 5–15)
ANION GAP: 12 (ref 5–15)
BUN: 20 mg/dL (ref 6–20)
BUN: 21 mg/dL — ABNORMAL HIGH (ref 6–20)
CHLORIDE: 107 mmol/L (ref 101–111)
CO2: 13 mmol/L — ABNORMAL LOW (ref 22–32)
CO2: 13 mmol/L — AB (ref 22–32)
Calcium: 8.5 mg/dL — ABNORMAL LOW (ref 8.9–10.3)
Calcium: 8.4 mg/dL — ABNORMAL LOW (ref 8.9–10.3)
Chloride: 109 mmol/L (ref 101–111)
Creatinine, Ser: 2.91 mg/dL — ABNORMAL HIGH (ref 0.44–1.00)
Creatinine, Ser: 2.89 mg/dL — ABNORMAL HIGH (ref 0.44–1.00)
GFR calc Af Amer: 19 mL/min — ABNORMAL LOW (ref >60)
GFR calc Af Amer: 19 mL/min — ABNORMAL LOW (ref >60)
GFR calc non Af Amer: 17 mL/min — ABNORMAL LOW (ref >60)
GFR, EST NON AFRICAN AMERICAN: 16 mL/min — AB (ref >60)
GLUCOSE: 239 mg/dL — AB (ref 65–99)
Glucose, Bld: 306 mg/dL — ABNORMAL HIGH (ref 65–99)
POTASSIUM: 5.5 mmol/L — AB (ref 3.5–5.1)
POTASSIUM: 4.8 mmol/L (ref 3.5–5.1)
Sodium: 134 mmol/L — ABNORMAL LOW (ref 135–145)
Sodium: 134 mmol/L — ABNORMAL LOW (ref 135–145)

## 2016-12-30 LAB — RESPIRATORY PANEL BY PCR
Adenovirus: NOT DETECTED
Bordetella pertussis: NOT DETECTED
CORONAVIRUS OC43-RVPPCR: NOT DETECTED
Chlamydophila pneumoniae: NOT DETECTED
Coronavirus 229E: NOT DETECTED
Coronavirus HKU1: NOT DETECTED
Coronavirus NL63: NOT DETECTED
INFLUENZA A-RVPPCR: NOT DETECTED
INFLUENZA B-RVPPCR: NOT DETECTED
Metapneumovirus: NOT DETECTED
Mycoplasma pneumoniae: NOT DETECTED
PARAINFLUENZA VIRUS 1-RVPPCR: NOT DETECTED
PARAINFLUENZA VIRUS 4-RVPPCR: NOT DETECTED
Parainfluenza Virus 2: NOT DETECTED
Parainfluenza Virus 3: NOT DETECTED
RESPIRATORY SYNCYTIAL VIRUS-RVPPCR: NOT DETECTED
Rhinovirus / Enterovirus: NOT DETECTED

## 2016-12-30 LAB — CBC
HCT: 29.1 % — ABNORMAL LOW (ref 36.0–46.0)
HEMOGLOBIN: 9.3 g/dL — AB (ref 12.0–15.0)
MCH: 22.6 pg — AB (ref 26.0–34.0)
MCHC: 32 g/dL (ref 30.0–36.0)
MCV: 70.6 fL — ABNORMAL LOW (ref 78.0–100.0)
Platelets: 325 10*3/uL (ref 150–400)
RBC: 4.12 MIL/uL (ref 3.87–5.11)
RDW: 15.3 % (ref 11.5–15.5)
WBC: 24.7 10*3/uL — AB (ref 4.0–10.5)

## 2016-12-30 LAB — PROCALCITONIN: Procalcitonin: 5.49 ng/mL

## 2016-12-30 LAB — LACTIC ACID, PLASMA: LACTIC ACID, VENOUS: 1.7 mmol/L (ref 0.5–1.9)

## 2016-12-30 MED ORDER — ALUM & MAG HYDROXIDE-SIMETH 200-200-20 MG/5ML PO SUSP
30.0000 mL | ORAL | Status: DC | PRN
  Administered 2016-12-30: 30 mL via ORAL
  Filled 2016-12-30: qty 30

## 2016-12-30 MED ORDER — LIVING WELL WITH DIABETES BOOK
Freq: Once | Status: DC
  Filled 2016-12-30: qty 1

## 2016-12-30 MED ORDER — CLONIDINE HCL 0.2 MG PO TABS
0.2000 mg | ORAL_TABLET | Freq: Three times a day (TID) | ORAL | Status: DC
  Administered 2016-12-30 – 2017-01-11 (×35): 0.2 mg via ORAL
  Filled 2016-12-30 (×37): qty 1

## 2016-12-30 MED ORDER — INSULIN GLARGINE 100 UNIT/ML ~~LOC~~ SOLN
20.0000 [IU] | Freq: Two times a day (BID) | SUBCUTANEOUS | Status: DC
  Administered 2016-12-30: 20 [IU] via SUBCUTANEOUS
  Filled 2016-12-30 (×4): qty 0.2

## 2016-12-30 NOTE — Progress Notes (Signed)
Progress Note  Patient Name: Alejandra Hernandez Date of Encounter: 12/30/2016  Primary Cardiologist: Radford Pax   Subjective  61 year old woman with a history of insulin dependent type 2 diabetes, carotid artery stenosis, PAD, HTN, and gout who presented to the hospital for flu like symptoms with cough productive of some blood for 4 days who was found to be in DKA  She really has not had any cardiac problems. She continues to have problems with acute renal failure.  Inpatient Medications    Scheduled Meds: . allopurinol  100 mg Oral BID  . amLODipine  10 mg Oral Q supper  . azithromycin  250 mg Oral Daily  . cefTRIAXone (ROCEPHIN)  IV  1 g Intravenous Q24H  . cloNIDine  0.2 mg Oral TID  . insulin aspart  0-15 Units Subcutaneous TID WC  . insulin aspart  0-5 Units Subcutaneous QHS  . insulin glargine  20 Units Subcutaneous BID  . isosorbide mononitrate  15 mg Oral Daily  . pantoprazole  40 mg Oral QHS  . rosuvastatin  40 mg Oral Daily   Continuous Infusions: . sodium chloride 125 mL/hr at 12/30/16 0058   PRN Meds: acetaminophen, alum & mag hydroxide-simeth, guaiFENesin-dextromethorphan, ondansetron, oxyCODONE-acetaminophen, technetium TC 64M diethylenetriame-pentaacetic acid   Vital Signs    Vitals:   12/29/16 2307 12/30/16 0018 12/30/16 0325 12/30/16 1100  BP: (!) 143/64  (!) 125/59 138/62  Pulse: (!) 122  93 (!) 121  Resp: (!) 32  (!) 21 (!) 28  Temp: 99.5 F (37.5 C) (!) 100.9 F (38.3 C) 98.3 F (36.8 C) 100.1 F (37.8 C)  TempSrc: Oral Axillary Oral Oral  SpO2: 94%  93% 93%  Weight:      Height:        Intake/Output Summary (Last 24 hours) at 12/30/16 1154 Last data filed at 12/30/16 1100  Gross per 24 hour  Intake          1891.67 ml  Output             3675 ml  Net         -1783.33 ml   Filed Weights   12/28/16 1656  Weight: 153 lb 3.5 oz (69.5 kg)    Telemetry    Sinus tach  - Personally Reviewed  ECG       Physical Exam   GEN: No acute  distress.   Neck: No JVD Cardiac: RRR, no murmurs, rubs, or gallops.  Respiratory: Clear to auscultation bilaterally. GI: Soft, nontender, non-distended  MS: No edema; No deformity. Neuro:  Nonfocal  Psych: Normal affect   Labs    Chemistry Recent Labs Lab 12/29/16 0509 12/29/16 0808 12/30/16 0545  NA 133* 133* 134*  K 3.6 3.9 5.5*  CL 108 102 107  CO2 18* 16* 13*  GLUCOSE 120* 191* 306*  BUN 18 19 20   CREATININE 2.33* 2.49* 2.91*  CALCIUM 8.5* 8.6* 8.5*  GFRNONAA 22* 20* 16*  GFRAA 25* 23* 19*  ANIONGAP 7 15 14      Hematology Recent Labs Lab 12/28/16 0935 12/29/16 0808 12/30/16 0545  WBC 31.9* 29.6* 24.7*  RBC 4.23 4.54 4.12  HGB 9.6* 10.6* 9.3*  HCT 30.6* 32.6* 29.1*  MCV 72.3* 71.8* 70.6*  MCH 22.7* 23.3* 22.6*  MCHC 31.4 32.5 32.0  RDW 15.1 15.2 15.3  PLT 351 305 325    Cardiac Enzymes Recent Labs Lab 12/29/16 0410 12/29/16 0808 12/29/16 1323 12/29/16 1854  TROPONINI 0.22* 0.14* 0.07* 0.08*  Recent Labs Lab 12/28/16 1001  TROPIPOC 0.03     BNPNo results for input(s): BNP, PROBNP in the last 168 hours.   DDimer  Recent Labs Lab 12/28/16 1735  DDIMER >20.00*     Radiology    Nm Pulmonary Perf And Vent  Result Date: 12/29/2016 CLINICAL DATA:  Abnormal chest x-ray, elevated D-dimer EXAM: NUCLEAR MEDICINE VENTILATION - PERFUSION LUNG SCAN TECHNIQUE: Ventilation images were obtained in multiple projections using inhaled aerosol Tc-39m DTPA. Perfusion images were obtained in multiple projections after intravenous injection of Tc-12m MAA. RADIOPHARMACEUTICALS:  4.3 mCi Technetium-59m DTPA aerosol inhalation and 32 mCi Technetium-46m MAA IV COMPARISON:  Chest x-ray 12/28/2016 FINDINGS: Ventilation: Moderate to large right perihilar defect is visualized. Heterogenous ventilation to the right thorax. A moderate right mid lung peripheral defect. Perfusion: Moderate defect in the right hilar area, corresponding to radiographic abnormality. Moderate  right mid lung peripheral defect. IMPRESSION: Single moderate right midlung zone matched defect; right hilar matched defect corresponding to radiographic abnormality. Overall low probability for pulmonary embolus. Electronically Signed   By: Donavan Foil M.D.   On: 12/29/2016 00:31    Cardiac Studies      Patient Profile      61 year old woman with a history of insulin dependent type 2 diabetes, carotid artery stenosis, PAD, HTN, and gout who presented to the hospital for flu like symptoms with cough productive of some blood for 4 days who was found to be in DKA  Assessment & Plan    1.  Chest pain: Her chest pains are very atypical. Her troponin levels were not consistent with acute cornea syndrome. They're more consistent with demand ischemia in the setting of her underlying DKA or viral illness. No indication for further evaluation at this time. She can return to see Dr. Radford Pax and have further workup as needed once she is better.  2. Acute renal failure: Further management per the internal medicine team.  No further cardiac recommendations. We will sign off. Call for questions.  Signed, Mertie Moores, MD  12/30/2016, 11:54 AM

## 2016-12-30 NOTE — Progress Notes (Signed)
Inpatient Diabetes Program Recommendations  AACE/ADA: New Consensus Statement on Inpatient Glycemic Control (2015)  Target Ranges:  Prepandial:   less than 140 mg/dL      Peak postprandial:   less than 180 mg/dL (1-2 hours)      Critically ill patients:  140 - 180 mg/dL   Lab Results  Component Value Date   GLUCAP 216 (H) 12/30/2016   HGBA1C >15.5 (H) 12/28/2016    Spoke with patient regarding self-management of diabetes  A1C results discussed.  Patient states that she does not like to take short acting insulin because of hypoglycemia.  This RN discussed taking Novolog 70/30 Mix and using Meal Coverage to lessen chance of low CBG's.  Patient has used 70/30 in past and knows how to count carb's.  Patient states she will most likely not attend outpatient classes, but would use a book (LWWD book ordered).  Thank you,  Windy Carina, RN, MSN Diabetes Coordinator Inpatient Diabetes Program [phone] (Team Pager)
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
## 2016-12-30 NOTE — Progress Notes (Signed)
RT came to do flutter - pt states she is in pain and requesting pain meds. RN notified

## 2016-12-30 NOTE — Evaluation (Signed)
Physical Therapy Evaluation Patient Details Name: Alejandra Hernandez MRN: [id number] DOB: Oct 05, 1956 Today's Date: 12/30/2016   History of Present Illness  Pt adm with DKA and flu like symptoms. PMH - DM, gout, htn, pad, back pain  Clinical Impression  Pt admitted with above diagnosis and presents to PT with functional limitations due to deficits listed below (See PT problem list). Pt needs skilled PT to maximize independence and safety to allow discharge to home with family if they can provide support. Pt very weak and deconditioned. If family not available and pt slow to progress may need ST-SNF.     Follow Up Recommendations Home health PT;Supervision/Assistance - 24 hour    Equipment Recommendations  Other (comment) (To be assessed)    Recommendations for Other Services       Precautions / Restrictions Precautions Precautions: Fall Precaution Comments: watch SpO2 Restrictions Weight Bearing Restrictions: No      Mobility  Bed Mobility               General bed mobility comments: Pt up in chair  Transfers Overall transfer level: Needs assistance Equipment used: Straight cane Transfers: Sit to/from Stand Sit to Stand: Min assist         General transfer comment: Assist for balance  Ambulation/Gait Ambulation/Gait assistance: Min assist Ambulation Distance (Feet): 10 Feet (x 2) Assistive device: Straight cane Gait Pattern/deviations: Step-through pattern;Decreased step length - right;Decreased step length - left;Shuffle;Drifts right/left Gait velocity: decr Gait velocity interpretation: Below normal speed for age/gender General Gait Details: Unsteady gait with cane requiring min A for balance. Pt refused use of walker. Pt on O2 with SpO2 86% on 2L and >90% on 3L.  Stairs            Wheelchair Mobility    Modified Rankin (Stroke Patients Only)       Balance Overall balance assessment: Needs assistance Sitting-balance support: No upper extremity  supported;Feet supported Sitting balance-Leahy Scale: Good     Standing balance support: No upper extremity supported;During functional activity Standing balance-Leahy Scale: Fair                               Pertinent Vitals/Pain Pain Assessment: No/denies pain    Home Living Family/patient expects to be discharged to:: Private residence Living Arrangements: Children;Non-relatives/Friends (roommated) Available Help at Discharge: Family;Available PRN/intermittently Type of Home: House Home Access: Stairs to enter   [employer] of Steps: 1 Home Layout: One level Home Equipment: Cane - single point      Prior Function Level of Independence: Independent with assistive device(s)         Comments: Uses cane     Hand Dominance        Extremity/Trunk Assessment   Upper Extremity Assessment Upper Extremity Assessment: Generalized weakness    Lower Extremity Assessment Lower Extremity Assessment: Generalized weakness       Communication   Communication: No difficulties  Cognition Arousal/Alertness: Awake/alert Behavior During Therapy: WFL for tasks assessed/performed Overall Cognitive Status: Within Functional Limits for tasks assessed                 General Comments: Pt seems a little "off" at times but difficult to pinpoint.    General Comments      Exercises     Assessment/Plan    PT Assessment Patient needs continued PT services  PT Problem List Decreased strength;Decreased activity tolerance;Decreased balance;Decreased mobility;Decreased knowledge of use of  DME;Decreased safety awareness          PT Treatment Interventions DME instruction;Gait training;Functional mobility training;Therapeutic activities;Therapeutic exercise;Balance training;Patient/family education;Stair training    PT Goals (Current goals can be found in the Care Plan section)  Acute Rehab PT Goals Patient Stated Goal: go home PT Goal Formulation:  With patient Time For Goal Achievement: 01/06/17 Potential to Achieve Goals: Good    Frequency Min 3X/week   Barriers to discharge Decreased caregiver support Unsure how much family available to assist at home    Co-evaluation               End of Session Equipment Utilized During Treatment: Oxygen;Gait belt Activity Tolerance: Patient limited by fatigue Patient left: in chair;with call bell/phone within reach Nurse Communication: Mobility status         Time: 1319-1340 PT Time Calculation (min) (ACUTE ONLY): 21 min   Charges:   PT Evaluation $PT Eval Moderate Complexity: 1 Procedure     PT G CodesShary Decamp [place] 01/16/2017, 3:24 PM [facility] PT [phone]

## 2016-12-31 DIAGNOSIS — N170 Acute kidney failure with tubular necrosis: Secondary | ICD-10-CM

## 2016-12-31 LAB — GLUCOSE, CAPILLARY
GLUCOSE-CAPILLARY: 154 mg/dL — AB (ref 65–99)
Glucose-Capillary: 167 mg/dL — ABNORMAL HIGH (ref 65–99)
Glucose-Capillary: 122 mg/dL — ABNORMAL HIGH (ref 65–99)

## 2016-12-31 LAB — CBC
HCT: 25.2 % — ABNORMAL LOW (ref 36.0–46.0)
Hemoglobin: 8.1 g/dL — ABNORMAL LOW (ref 12.0–15.0)
MCH: 22.2 pg — ABNORMAL LOW (ref 26.0–34.0)
MCHC: 32.1 g/dL (ref 30.0–36.0)
MCV: 69 fL — ABNORMAL LOW (ref 78.0–100.0)
PLATELETS: 306 10*3/uL (ref 150–400)
RBC: 3.65 MIL/uL — ABNORMAL LOW (ref 3.87–5.11)
RDW: 15.1 % (ref 11.5–15.5)
WBC: 16.2 10*3/uL — ABNORMAL HIGH (ref 4.0–10.5)

## 2016-12-31 LAB — BASIC METABOLIC PANEL
Anion gap: 12 (ref 5–15)
BUN: 21 mg/dL — AB (ref 6–20)
CALCIUM: 8.2 mg/dL — AB (ref 8.9–10.3)
CHLORIDE: 108 mmol/L (ref 101–111)
CO2: 15 mmol/L — ABNORMAL LOW (ref 22–32)
CREATININE: 2.9 mg/dL — AB (ref 0.44–1.00)
GFR calc Af Amer: 19 mL/min — ABNORMAL LOW (ref >60)
GFR, EST NON AFRICAN AMERICAN: 17 mL/min — AB (ref >60)
Glucose, Bld: 177 mg/dL — ABNORMAL HIGH (ref 65–99)
Potassium: 4.4 mmol/L (ref 3.5–5.1)
SODIUM: 135 mmol/L (ref 135–145)

## 2016-12-31 MED ORDER — INSULIN GLARGINE 100 UNIT/ML ~~LOC~~ SOLN
25.0000 [IU] | Freq: Two times a day (BID) | SUBCUTANEOUS | Status: DC
  Administered 2016-12-31: 25 [IU] via SUBCUTANEOUS
  Filled 2016-12-31 (×3): qty 0.25

## 2016-12-31 NOTE — Progress Notes (Signed)
Physical Therapy Treatment Patient Details Name: Alejandra Hernandez MRN: [id number] DOB: Jan 17, 1956 Today's Date: 12/31/2016    History of Present Illness Pt adm with DKA and flu like symptoms. PMH - DM, gout, htn, pad, back pain    PT Comments    Pt able to tolerate ambulation today with 3L supplemental O2 at 91-95% throughout treatment with no c/o of SOB or light headed. She required less assistance today and minimal cuing with RW compared to straight cane last session. Educated pt on importance of walking multiple times throughout the day with nursing and at home after d/c. Pt stated she has RW at home and doesn't need one ordered before leaving. Pt will continue to benefit from progressive ambulation while monitoring O2 sats.    Follow Up Recommendations  Home health PT;Supervision/Assistance - 24 hour     Equipment Recommendations  Other (comment) (TBA)    Recommendations for Other Services       Precautions / Restrictions Precautions Precautions: Fall Precaution Comments: watch SpO2 Restrictions Weight Bearing Restrictions: No    Mobility  Bed Mobility Overal bed mobility: Needs Assistance Bed Mobility: Supine to Sit     Supine to sit: Supervision     General bed mobility comments: supervision for safety and line/ foley management  Transfers Overall transfer level: Needs assistance Equipment used: Rolling walker (2 wheeled) Transfers: Sit to/from Stand Sit to Stand: Min guard         General transfer comment: pt required cuing for hand placement and min guard for safety due to unsteadiness while rising off EOB  Ambulation/Gait Ambulation/Gait assistance: Min guard Ambulation Distance (Feet): 15 Feet (+25 feet) Assistive device: Rolling walker (2 wheeled) Gait Pattern/deviations: Step-through pattern;Decreased step length - right;Decreased step length - left;Shuffle;Drifts right/left Gait velocity: slow   General Gait Details: Pt agreed to use RW today;  required cuing to correct drifting in RW but otherwise safe throughout gait training with min guard from SPTA. Pt on 3L SpO2 ranging between 90-95% during tx.    Stairs            Wheelchair Mobility    Modified Rankin (Stroke Patients Only)       Balance Overall balance assessment: Needs assistance   Sitting balance-Leahy Scale: Good Sitting balance - Comments: pt sitting EOB unsupported with no LOB   Standing balance support: No upper extremity supported;During functional activity Standing balance-Leahy Scale: Fair Standing balance comment: Pt able to stand at sink with no UE support to wash face but requires bil UE support during ambulation to prevent LOB.                     Cognition Arousal/Alertness: Awake/alert Behavior During Therapy: WFL for tasks assessed/performed Overall Cognitive Status: Within Functional Limits for tasks assessed                      Exercises      General Comments        Pertinent Vitals/Pain Pain Assessment: Faces Faces Pain Scale: Hurts a little bit Pain Location: back (chronic) Pain Descriptors / Indicators: Constant Pain Intervention(s): Repositioned    Home Living                      Prior Function            PT Goals (current goals can now be found in the care plan section) Acute Rehab PT Goals Patient Stated Goal: to get  better in order to go home PT Goal Formulation: With patient Potential to Achieve Goals: Good Progress towards PT goals: Progressing toward goals    Frequency    Min 3X/week      PT Plan Current plan remains appropriate    Co-evaluation             End of Session Equipment Utilized During Treatment: Oxygen;Gait belt Activity Tolerance: Patient limited by fatigue Patient left: in chair;with call bell/phone within reach     Time: [id number] PT Time Calculation (min) (ACUTE ONLY): 29 min  Charges:  $Gait Training: 8-22 mins $Therapeutic Activity: 8-22  mins                    G Codes:      [facility] 26-Jan-2017, 4:44 PM [place], [place] Pager [phone]
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
## 2017-01-01 ENCOUNTER — Inpatient Hospital Stay (HOSPITAL_COMMUNITY): Payer: Medicare Other

## 2017-01-01 DIAGNOSIS — K921 Melena: Secondary | ICD-10-CM | POA: Diagnosis not present

## 2017-01-01 DIAGNOSIS — E1165 Type 2 diabetes mellitus with hyperglycemia: Secondary | ICD-10-CM

## 2017-01-01 DIAGNOSIS — E1151 Type 2 diabetes mellitus with diabetic peripheral angiopathy without gangrene: Secondary | ICD-10-CM

## 2017-01-01 DIAGNOSIS — D62 Acute posthemorrhagic anemia: Secondary | ICD-10-CM

## 2017-01-01 LAB — BASIC METABOLIC PANEL
ANION GAP: 11 (ref 5–15)
BUN: 23 mg/dL — ABNORMAL HIGH (ref 6–20)
CHLORIDE: 108 mmol/L (ref 101–111)
CO2: 15 mmol/L — AB (ref 22–32)
CREATININE: 2.89 mg/dL — AB (ref 0.44–1.00)
Calcium: 8.1 mg/dL — ABNORMAL LOW (ref 8.9–10.3)
GFR calc non Af Amer: 17 mL/min — ABNORMAL LOW (ref >60)
GFR, EST AFRICAN AMERICAN: 19 mL/min — AB (ref >60)
Glucose, Bld: 107 mg/dL — ABNORMAL HIGH (ref 65–99)
POTASSIUM: 4.1 mmol/L (ref 3.5–5.1)
SODIUM: 134 mmol/L — AB (ref 135–145)

## 2017-01-01 LAB — PREPARE RBC (CROSSMATCH)

## 2017-01-01 LAB — CBC
HEMATOCRIT: 23.8 % — AB (ref 36.0–46.0)
HEMOGLOBIN: 7.7 g/dL — AB (ref 12.0–15.0)
MCH: 22.3 pg — ABNORMAL LOW (ref 26.0–34.0)
MCHC: 32.4 g/dL (ref 30.0–36.0)
MCV: 68.8 fL — ABNORMAL LOW (ref 78.0–100.0)
Platelets: 293 10*3/uL (ref 150–400)
RBC: 3.46 MIL/uL — AB (ref 3.87–5.11)
RDW: 15.4 % (ref 11.5–15.5)
WBC: 12 10*3/uL — ABNORMAL HIGH (ref 4.0–10.5)

## 2017-01-01 LAB — FERRITIN: FERRITIN: 517 ng/mL — AB (ref 11–307)

## 2017-01-01 LAB — RETICULOCYTES
RBC.: 3.77 MIL/uL — ABNORMAL LOW (ref 3.87–5.11)
RETIC COUNT ABSOLUTE: 30.2 10*3/uL (ref 19.0–186.0)
RETIC CT PCT: 0.8 % (ref 0.4–3.1)

## 2017-01-01 LAB — GLUCOSE, CAPILLARY
GLUCOSE-CAPILLARY: 74 mg/dL (ref 65–99)
GLUCOSE-CAPILLARY: 206 mg/dL — AB (ref 65–99)
Glucose-Capillary: 313 mg/dL — ABNORMAL HIGH (ref 65–99)

## 2017-01-01 LAB — VITAMIN B12: Vitamin B-12: 876 pg/mL (ref 180–914)

## 2017-01-01 LAB — IRON AND TIBC
Iron: 8 ug/dL — ABNORMAL LOW (ref 28–170)
SATURATION RATIOS: 6 % — AB (ref 10.4–31.8)
TIBC: 141 ug/dL — ABNORMAL LOW (ref 250–450)
UIBC: 133 ug/dL

## 2017-01-01 LAB — HEMOGLOBIN AND HEMATOCRIT, BLOOD
HCT: 27.6 % — ABNORMAL LOW (ref 36.0–46.0)
HEMOGLOBIN: 9 g/dL — AB (ref 12.0–15.0)

## 2017-01-01 LAB — SAVE SMEAR

## 2017-01-01 LAB — URIC ACID: Uric Acid, Serum: 2.4 mg/dL (ref 2.3–6.6)

## 2017-01-01 MED ORDER — SODIUM CHLORIDE 0.9 % IV SOLN
510.0000 mg | Freq: Once | INTRAVENOUS | Status: AC
  Administered 2017-01-01: 510 mg via INTRAVENOUS
  Filled 2017-01-01: qty 17

## 2017-01-01 MED ORDER — INSULIN GLARGINE 100 UNIT/ML ~~LOC~~ SOLN
20.0000 [IU] | Freq: Two times a day (BID) | SUBCUTANEOUS | Status: DC
  Administered 2017-01-01 – 2017-01-03 (×5): 20 [IU] via SUBCUTANEOUS
  Filled 2017-01-01 (×8): qty 0.2

## 2017-01-01 MED ORDER — SODIUM CHLORIDE 0.9 % IV SOLN: Freq: Once | INTRAVENOUS | Status: DC

## 2017-01-01 MED ORDER — SODIUM CHLORIDE 0.9 % IV SOLN
Freq: Once | INTRAVENOUS | Status: AC
  Administered 2017-01-01: 15:00:00 via INTRAVENOUS
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
## 2017-01-01 NOTE — Progress Notes (Signed)
Patient feels she may need oxygen therapy at home. Is currently on 3LPM oxygen with SPO2 at 95% at rest. Ambulated approximately 40 feet in hall with front wheeled walker before feeling very short of breath and dizzy, needed to sit down before continuing. SPO2 at that time was 94% on room air, and her heart rate was 122. After sitting for 3 minutes her heart rate went down to 107, and I put her back on her previous 3LPM oxygen, which resolved her shortness of breath.

## 2017-01-02 ENCOUNTER — Inpatient Hospital Stay (HOSPITAL_COMMUNITY): Payer: Medicare Other

## 2017-01-02 DIAGNOSIS — R9389 Abnormal findings on diagnostic imaging of other specified body structures: Secondary | ICD-10-CM | POA: Diagnosis present

## 2017-01-02 LAB — GLUCOSE, CAPILLARY
GLUCOSE-CAPILLARY: 229 mg/dL — AB (ref 65–99)
GLUCOSE-CAPILLARY: 196 mg/dL — AB (ref 65–99)
Glucose-Capillary: 219 mg/dL — ABNORMAL HIGH (ref 65–99)
Glucose-Capillary: 294 mg/dL — ABNORMAL HIGH (ref 65–99)

## 2017-01-02 LAB — FOLATE RBC
FOLATE, HEMOLYSATE: 281.8 ng/mL
FOLATE, RBC: 1036 ng/mL (ref >498)
HEMATOCRIT: 27.2 % — AB (ref 34.0–46.6)

## 2017-01-02 LAB — BASIC METABOLIC PANEL
Anion gap: 13 (ref 5–15)
BUN: 23 mg/dL — ABNORMAL HIGH (ref 6–20)
CHLORIDE: 104 mmol/L (ref 101–111)
CO2: 15 mmol/L — ABNORMAL LOW (ref 22–32)
CREATININE: 3.1 mg/dL — AB (ref 0.44–1.00)
Calcium: 8.9 mg/dL (ref 8.9–10.3)
GFR, EST AFRICAN AMERICAN: 18 mL/min — AB (ref >60)
GFR, EST NON AFRICAN AMERICAN: 15 mL/min — AB (ref >60)
Glucose, Bld: 223 mg/dL — ABNORMAL HIGH (ref 65–99)
POTASSIUM: 4.3 mmol/L (ref 3.5–5.1)
SODIUM: 132 mmol/L — AB (ref 135–145)

## 2017-01-02 LAB — TYPE AND SCREEN
BLOOD PRODUCT EXPIRATION DATE: 201802212359
ISSUE DATE / TIME: 201802081321
UNIT TYPE AND RH: 600

## 2017-01-02 LAB — CULTURE, BLOOD (ROUTINE X 2)
CULTURE: NO GROWTH
Culture: NO GROWTH

## 2017-01-02 LAB — CBC
HCT: 32.4 % — ABNORMAL LOW (ref 36.0–46.0)
HEMOGLOBIN: 10.7 g/dL — AB (ref 12.0–15.0)
MCH: 23.5 pg — ABNORMAL LOW (ref 26.0–34.0)
MCHC: 33 g/dL (ref 30.0–36.0)
MCV: 71.1 fL — ABNORMAL LOW (ref 78.0–100.0)
PLATELETS: 283 10*3/uL (ref 150–400)
RBC: 4.56 MIL/uL (ref 3.87–5.11)
RDW: 16.4 % — ABNORMAL HIGH (ref 11.5–15.5)
WBC: 12.6 10*3/uL — ABNORMAL HIGH (ref 4.0–10.5)

## 2017-01-02 MED ORDER — PEG-KCL-NACL-NASULF-NA ASC-C 100 G PO SOLR: 0.5000 | Freq: Once | ORAL | Status: DC

## 2017-01-02 MED ORDER — METOCLOPRAMIDE HCL 5 MG/ML IJ SOLN: 10.0000 mg | Freq: Once | INTRAMUSCULAR | Status: DC

## 2017-01-02 MED ORDER — PEG-KCL-NACL-NASULF-NA ASC-C 100 G PO SOLR
0.5000 | Freq: Once | ORAL | Status: DC
  Filled 2017-01-02: qty 1

## 2017-01-02 MED ORDER — PEG-KCL-NACL-NASULF-NA ASC-C 100 G PO SOLR: 1.0000 | Freq: Once | ORAL | Status: DC

## 2017-01-02 MED ORDER — BISACODYL 5 MG PO TBEC
5.0000 mg | DELAYED_RELEASE_TABLET | Freq: Three times a day (TID) | ORAL | Status: DC
  Administered 2017-01-02: 5 mg via ORAL
  Filled 2017-01-02: qty 1
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
## 2017-01-02 NOTE — Progress Notes (Signed)
Inpatient Diabetes Program Recommendations  AACE/ADA: New Consensus Statement on Inpatient Glycemic Control (2015)  Target Ranges:  Prepandial:   less than 140 mg/dL      Peak postprandial:   less than 180 mg/dL (1-2 hours)      Critically ill patients:  140 - 180 mg/dL   Lab Results  Component Value Date   GLUCAP 229 (H) 01/02/2017   HGBA1C >15.5 (H) 12/28/2016    Review of Glycemic Control Results for Alejandra Hernandez, MI (MRN [id number]) as of 01/02/2017 10:00  Ref. Range 12/31/2016 20:52 01/01/2017 06:06 01/01/2017 11:43 01/01/2017 20:28 01/02/2017 06:24  Glucose-Capillary Latest Ref Range: 65 - 99 mg/dL 122 (H) 74 206 (H) 313 (H) 229 (H)   Diabetes history: DM Lantus 30 units BID + Humalog 3-5 units tid meal coverage Current orders for Inpatient glycemic control: Lantus 20 units bid  Inpatient Diabetes Program Recommendations:    Please consider: -Add Novolog 3 units meal coverage tid if eats 50% -Novolog correction 0-9 tid  Thank you, Nani Gasser. Zain Bingman, RN, MSN, CDE Inpatient Glycemic Control Team Team Pager [phone] (8am-5pm) 01/02/2017 10:18 AM

## 2017-01-02 NOTE — Progress Notes (Signed)
PT Cancellation Note  Patient Details Name: Alejandra Hernandez MRN: [id number] DOB: June 13, 1956   Cancelled Treatment:    Reason Eval/Treat Not Completed: Patient at procedure or test/unavailable. Will check back as schedule permits.   Cary Lothrop LUBECK 01/02/2017, 11:44 AM

## 2017-01-02 NOTE — Progress Notes (Signed)
Physical Therapy Treatment Patient Details Name: Alejandra Hernandez MRN: [id number] DOB: 1956-04-29 Today's Date: 01/02/2017    History of Present Illness Pt adm with DKA and flu like symptoms. PMH - DM, gout, htn, pad, back pain    PT Comments    Pt reluctantly agreeable to limited PT session in the room.  Pt reports she has a rollator at home she can use.  Recommend HHPT.  Follow Up Recommendations  Home health PT;Supervision/Assistance - 24 hour     Equipment Recommendations  None recommended by PT (pt states she has rollator at home)    Recommendations for Other Services       Precautions / Restrictions Precautions Precautions: Fall Precaution Comments: watch SpO2 Restrictions Weight Bearing Restrictions: No    Mobility  Bed Mobility Overal bed mobility: Needs Assistance Bed Mobility: Supine to Sit     Supine to sit: Supervision     General bed mobility comments: S for line management  Transfers Overall transfer level: Needs assistance Equipment used: 4-wheeled walker Transfers: Sit to/from Stand Sit to Stand: Min guard         General transfer comment: cues for hand placement, with little carryover  Ambulation/Gait Ambulation/Gait assistance: Min guard Ambulation Distance (Feet): 10 Feet (plus 15) Assistive device: 4-wheeled walker Gait Pattern/deviations: Decreased step length - right;Decreased step length - left;Trunk flexed     General Gait Details: Amb with rollator that was in room and some difficulty with its use due to being a bari-rollator and too large for space.  Pt reports R leg feels better with ambulation to bathroom then to recliner. Moves quickly and a little impulsive   Stairs            Wheelchair Mobility    Modified Rankin (Stroke Patients Only)       Balance   Sitting-balance support: No upper extremity supported;Feet supported Sitting balance-Leahy Scale: Good Sitting balance - Comments: pt sitting EOB unsupported  with no LOB   Standing balance support: No upper extremity supported Standing balance-Leahy Scale: Fair                      Cognition Arousal/Alertness: Awake/alert Behavior During Therapy: WFL for tasks assessed/performed Overall Cognitive Status: Within Functional Limits for tasks assessed                 General Comments: Gets frustrated easily, but cooperative    Exercises      General Comments General comments (skin integrity, edema, etc.): Pt agreeable, but just wanted to get therapy done.       Pertinent Vitals/Pain Pain Assessment: 0-10 Pain Score: 9  Pain Location: R leg (chronic) Pain Descriptors / Indicators: Constant Pain Intervention(s): Limited activity within patient's tolerance;Monitored during session;RN gave pain meds during session    Home Living                      Prior Function            PT Goals (current goals can now be found in the care plan section) Acute Rehab PT Goals Patient Stated Goal: to get better in order to go home PT Goal Formulation: With patient Time For Goal Achievement: 01/06/17 Potential to Achieve Goals: Good Progress towards PT goals: Progressing toward goals    Frequency    Min 3X/week      PT Plan Current plan remains appropriate    Co-evaluation  End of Session Equipment Utilized During Treatment: Oxygen;Gait belt Activity Tolerance: Patient limited by fatigue Patient left: in chair;with call bell/phone within reach     Time: 1401-1422 PT Time Calculation (min) (ACUTE ONLY): 21 min  Charges:  $Gait Training: 8-22 mins                    G Codes:      Alejandra Hernandez 01/02/2017, 2:43 PM
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
## 2017-01-02 NOTE — Care Management Important Message (Signed)
Important Message  Patient Details  Name: Alejandra Hernandez MRN: [id number] Date of Birth: 09-07-1956   Medicare Important Message Given:  Yes    Nathen May 01/02/2017, 1:59 PM

## 2017-01-03 ENCOUNTER — Encounter (HOSPITAL_COMMUNITY)
Admission: EM | Disposition: A | Discharge status: Home / Self Care | Payer: Self-pay | Source: Home / Self Care | Attending: Family Medicine

## 2017-01-03 DIAGNOSIS — J181 Lobar pneumonia, unspecified organism: Secondary | ICD-10-CM

## 2017-01-03 DIAGNOSIS — R918 Other nonspecific abnormal finding of lung field: Secondary | ICD-10-CM

## 2017-01-03 LAB — CBC
HCT: 30.1 % — ABNORMAL LOW (ref 36.0–46.0)
Hemoglobin: 9.9 g/dL — ABNORMAL LOW (ref 12.0–15.0)
MCH: 23.5 pg — AB (ref 26.0–34.0)
MCHC: 32.9 g/dL (ref 30.0–36.0)
MCV: 71.3 fL — ABNORMAL LOW (ref 78.0–100.0)
PLATELETS: 268 10*3/uL (ref 150–400)
RBC: 4.22 MIL/uL (ref 3.87–5.11)
RDW: 16.5 % — ABNORMAL HIGH (ref 11.5–15.5)
WBC: 15.5 10*3/uL — ABNORMAL HIGH (ref 4.0–10.5)

## 2017-01-03 LAB — BASIC METABOLIC PANEL
Anion gap: 17 — ABNORMAL HIGH (ref 5–15)
BUN: 15 mg/dL (ref 6–20)
CO2: 14 mmol/L — ABNORMAL LOW (ref 22–32)
Calcium: 8.3 mg/dL — ABNORMAL LOW (ref 8.9–10.3)
Chloride: 105 mmol/L (ref 101–111)
Creatinine, Ser: 2.94 mg/dL — ABNORMAL HIGH (ref 0.44–1.00)
GFR calc Af Amer: 19 mL/min — ABNORMAL LOW (ref >60)
GFR calc non Af Amer: 16 mL/min — ABNORMAL LOW (ref >60)
Glucose, Bld: 113 mg/dL — ABNORMAL HIGH (ref 65–99)
Potassium: 3.1 mmol/L — ABNORMAL LOW (ref 3.5–5.1)
Sodium: 136 mmol/L (ref 135–145)

## 2017-01-03 LAB — GLUCOSE, CAPILLARY
GLUCOSE-CAPILLARY: 116 mg/dL — AB (ref 65–99)
GLUCOSE-CAPILLARY: 113 mg/dL — AB (ref 65–99)
Glucose-Capillary: 140 mg/dL — ABNORMAL HIGH (ref 65–99)
Glucose-Capillary: 107 mg/dL — ABNORMAL HIGH (ref 65–99)

## 2017-01-03 LAB — PATHOLOGIST SMEAR REVIEW

## 2017-01-03 SURGERY — COLONOSCOPY
Anesthesia: Moderate Sedation

## 2017-01-03 MED ORDER — POTASSIUM CHLORIDE CRYS ER 20 MEQ PO TBCR
20.0000 meq | EXTENDED_RELEASE_TABLET | Freq: Once | ORAL | Status: AC
  Administered 2017-01-03: 20 meq via ORAL
  Filled 2017-01-03: qty 1
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
## 2017-01-03 NOTE — Consult Note (Signed)
Name: Alejandra Hernandez MRN: [id number] DOB: [birth year]    ADMISSION DATE:  12/28/2016 CONSULTATION DATE:  01/03/17  REFERRING MD :  McIntyre/ FMTS  CHIEF COMPLAINT:  Lung mass  BRIEF PATIENT DESCRIPTION: 61 year old female long-time smoker presented with flulike symptoms, DKA and incidental finding of right upper lobe mass.PMH is significant for IDT2DM, CAD, HTN, Gout, PAD, occult GI bleed  SIGNIFICANT EVENTS    STUDIES:     HISTORY OF PRESENT ILLNESS:  40 year smoking history averaging one half pack per day quitting last year. Presented with recent flulike symptoms and DKA. Denies significant respiratory disease past history. Describes cough 1 week productive "pus and blood" with no previous hemoptysis. Short of breath for 3 or 4 days. Pain right upper anterior chest and right scapula increased by moving arm, pressure over right supraclavicular area. Not aware of adenopathy. Denies past history TB exposure or prior pneumonia. 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 Past medical history of DM 2, CAD, HBP, gout, PAD, occult GI bleed. CT chest imaging reviewed by me shows 7.5 cm mass abutting the right anterior chest wall, mildly enlarged mediastinal lymph nodes, small right pleural effusion and diffuse right upper lobe interstitial disease consistent with pneumonia or possibly lymphangitic CA. Family history of thyroid and liver cancers.  PAST MEDICAL HISTORY :   has a past medical history of AKI (acute kidney injury) ([place]) (01/2016); Anemia; Anxiety; Arthritis; Bleeding stomach ulcer (01/2016); Carotid artery disease ([place]); Chronic back pain; Chronic lower back pain; Diabetic peripheral neuropathy ([place]); GERD (gastroesophageal reflux disease); Gout; Headache; Heart murmur; History of blood transfusion; History of echocardiogram; History of nuclear stress test; Hyperlipidemia; Hypertension; PAD (peripheral artery disease) ([place]); and Type II diabetes mellitus ([place]).  has a past  surgical history that includes Lymph gland excision (Right); Cesarean section (1989); Knee arthroscopy; Esophagogastroduodenoscopy (Left, 02/12/2016); Cardiac catheterization (N/A, 07/23/2016); Back surgery; Anterior cervical decomp/discectomy fusion (04/2011); Exam under anesthesia with manipulation of shoulder (Left, 03/2003); Tubal ligation (1989); Cardiac catheterization (07/30/2016); and Cardiac catheterization (Bilateral, 07/30/2016). Prior to Admission medications   Medication Sig Start Date End Date Taking? Authorizing Provider  allopurinol (ZYLOPRIM) 100 MG tablet Take 100 mg by mouth 2 (two) times daily.    Yes Historical Provider, MD  ALPRAZolam Duanne Moron) 0.5 MG tablet Take 0.25 mg by mouth 2 (two) times daily.    Yes Historical Provider, MD  amitriptyline (ELAVIL) 25 MG tablet Take 50 mg by mouth at bedtime. 04/15/16  Yes Historical Provider, MD  amLODipine (NORVASC) 10 MG tablet Take 10 mg by mouth daily with supper.  03/08/14  Yes Historical Provider, MD  ARTIFICIAL TEAR OP Place 2 drops into both eyes as needed (for dry eyes).   Yes Historical Provider, MD  aspirin 81 MG tablet Take 81 mg by mouth daily.   Yes Historical Provider, MD  cloNIDine (CATAPRES) 0.2 MG tablet Take 0.2 mg by mouth 3 (three) times daily.    Yes Historical Provider, MD  ferrous sulfate 325 (65 FE) MG tablet Take 325 mg by mouth 2 (two) times daily with a meal.   Yes Historical Provider, MD  HUMALOG KWIKPEN 100 UNIT/ML KiwkPen Inject 3-5 Units into the skin 2 (two) times daily as needed (for high blood sugar). Per sliding scale 12/13/16  Yes Historical Provider, MD  Insulin Glargine (LANTUS SOLOSTAR) 100 UNIT/ML Solostar Pen Inject 30-40 Units into the skin 2 (two) times daily. Per sliding scale   Yes Historical Provider, MD  isosorbide mononitrate (IMDUR) 30 MG 24 hr tablet  Take 0.5 tablets (15 mg total) by mouth daily. 06/09/16  Yes Scott Joylene Draft, PA-C  lisinopril (PRINIVIL,ZESTRIL) 20 MG tablet Take 20 mg by mouth daily.  03/13/16  Yes Historical Provider, MD  nitroGLYCERIN (NITROSTAT) 0.4 MG SL tablet Place 1 tablet (0.4 mg total) under the tongue every 5 (five) minutes as needed for chest pain. 04/26/14  Yes Janece Canterbury, MD  oxyCODONE-acetaminophen (PERCOCET) 10-325 MG tablet Take 1 tablet by mouth every 6 (six) hours as needed for pain.    Yes Historical Provider, MD  pantoprazole (PROTONIX) 40 MG tablet TAKE 1 TABLET BY MOUTH TWICE DAILY BEFORE A MEAL Patient taking differently: Take 40 mg by mouth at night 06/26/16  Yes Vivi Barrack, MD  rosuvastatin (CRESTOR) 40 MG tablet Take 1 tablet (40 mg total) by mouth daily. 02/29/16  Yes Sueanne Margarita, MD  spironolactone (ALDACTONE) 25 MG tablet Take 0.5 tablets (12.5 mg total) by mouth daily. 10/20/16 12/28/16 Yes Almyra Deforest, PA  ciprofloxacin (CIPRO) 500 MG tablet Take 1 tablet (500 mg total) by mouth 2 (two) times daily. Patient not taking: Reported on 12/28/2016 08/05/16   Wellington Hampshire, MD  glucose blood test strip Use as instructed Patient not taking: Reported on 12/28/2016 05/01/16   Zenia Resides, MD   No Known Allergies  FAMILY HISTORY:  family history includes Diabetes in her mother and sister; Heart disease in her mother; Kidney failure in her mother. SOCIAL HISTORY:  reports that she quit smoking about 7 months ago. Her smoking use included Cigarettes. She has a 20.00 pack-year smoking history. She quit smokeless tobacco use about 44 years ago. Her smokeless tobacco use included Snuff. She reports that she does not drink alcohol or use drugs.  REVIEW OF SYSTEMS:   ROS-see HPI  "+" = POS Constitutional:    weight loss, night sweats, +fevers, chills, fatigue, lassitude. HEENT:    headaches, difficulty swallowing, tooth/dental problems, sore throat,       sneezing, itching, ear ache, nasal congestion, post nasal drip, snoring CV:    +chest pain, orthopnea, PND, swelling in lower extremities, anasarca,                                                             dizziness, palpitations Resp:   shortness of breath with exertion or at rest.                +productive cough,   non-productive cough, +coughing up of blood.              change in color of mucus.  wheezing.   Skin:    rash or lesions. GI:  No-   heartburn, indigestion, abdominal pain, nausea, vomiting, diarrhea,                 change in bowel habits, loss of appetite GU: dysuria, change in color of urine, no urgency or frequency.   flank pain. MS:   joint pain, stiffness, decreased range of motion, back pain. Neuro-     + dizzy Psych:  change in mood or affect.  depression or anxiety.   memory loss.    SUBJECTIVE:   VITAL SIGNS: Temp:  [98.7 F (37.1 C)-99.1 F (37.3 C)] 98.7 F (37.1 C) (02/10 0427) Pulse Rate:  [106]  106 (02/10 0427) Resp:  [18-20] 18 (02/10 0427) BP: (145-159)/(53-68) 159/60 (02/10 0427) SpO2:  [95 %-96 %] 96 % (02/10 0427)  PHYSICAL EXAMINATION: General:  Pleasant, obese female lying in bed, cooperative, NAD Neuro:  Grossly intact, oriented, moving all extremities, speech clear HEENT: Mucosa clear, no stridor Cardiovascular:  RRR, no mgr Lungs:  No rub right anterior chest wall. Somewhat diminished right lung breath sounds with few scattered crackles. Did not cough while I was with her. Body habitus limited evaluation for fluid right base. Abdomen: Obese, soft, nontender, no organomegaly Musculoskeletal:  Symmetrical without deformity Skin:  No rash Lymphadenopathy: None found   Recent Labs Lab 01/01/17 0320 01/02/17 0643 01/03/17 0603  NA 134* 132* 136  K 4.1 4.3 3.1*  CL 108 104 105  CO2 15* 15* 14*  BUN 23* 23* 15  CREATININE 2.89* 3.10* 2.94*  GLUCOSE 107* 223* 113*    Recent Labs Lab 01/01/17 0320  01/01/17 1900 01/02/17 0643 01/03/17 0603  HGB 7.7*  --  9.0* 10.7* 9.9*  HCT 23.8*  < > 27.6* 32.4* 30.1*  WBC 12.0*  --   --  12.6* 15.5*  PLT 293  --   --  283 268  < > = values in this interval not displayed. Ct Chest Wo  Contrast  Result Date: 01/02/2017 CLINICAL DATA:  Evaluate right lung mass. EXAM: CT CHEST WITHOUT CONTRAST TECHNIQUE: Multidetector CT imaging of the chest was performed following the standard protocol without IV contrast. COMPARISON:  Chest x-ray 12/28/2016.  Chest CT 04/25/2014 FINDINGS: Cardiovascular: The heart is normal size. Extensive aortic valve calcifications. Scattered coronary artery and aortic calcifications. No aneurysm. Mediastinum/Nodes: Mild mediastinal adenopathy. 11 mm subcarinal lymph node. 10 mm right paratracheal lymph node. Other smaller scattered mediastinal lymph nodes. Difficult to assess the hila for adenopathy without intravenous contrast. No axillary adenopathy. Lungs/Pleura: Large masslike opacity in the anterior right upper lobe measures 7.5 x 6.4 cm. Appearance is concerning for lung cancer. Extensive airspace disease throughout the right upper lobe could reflect pneumonia, less likely lymphangitic spread of tumor. Small to moderate right pleural effusion and small left pleural effusion. Bibasilar atelectasis. Mild to moderate centrilobular emphysema. Upper Abdomen: Imaging into the upper abdomen shows no acute findings. Musculoskeletal: Chest wall soft tissues are unremarkable. No acute bony abnormality. IMPRESSION: 7.5 cm anterior right upper lobe mass concerning for lung cancer. Associated mildly enlarged mediastinal lymph nodes and small to moderate right pleural effusion. Centrilobular emphysema. Airspace disease throughout the right upper lobe, likely pneumonia. Cannot exclude lymphangitic spread of tumor. Small left pleural effusion. Coronary artery disease. Electronically Signed   By: Rolm Baptise M.D.   On: 01/02/2017 12:19   US Renal  Result Date: 01/01/2017 CLINICAL DATA:  61 y/o  F; acute kidney injury. EXAM: RENAL / URINARY TRACT ULTRASOUND COMPLETE COMPARISON:  None. FINDINGS: Right Kidney: Length: 8.8 cm. Possible underestimation of kidney length due to limited  window. Echogenicity within normal limits. No mass or hydronephrosis visualized. Left Kidney: Length: 11.3 cm. Echogenicity within normal limits. No mass or hydronephrosis visualized. Bladder: Appears normal for degree of bladder distention. IMPRESSION: No mass or hydronephrosis visualized. Unremarkable renal ultrasound. Electronically Signed   By: Kristine Garbe M.D.   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 I can't tell that there is chest wall/rib invasion but the mass is immediately adjacent. Most direct biopsy approach would be needle biopsy by Interventional Radiology.  Interstitial infiltrate right upper lobe-she presented with flulike symptoms and there may be infection but lymphangitic carcinoma is alternative diagnosis. WBC is climbing. Cultures so far appear to be negative but empiric coverage for CAP should be considered.  Pleural effusion right-nonspecific but likely malignant. I don't think this needs to be tapped initially, favoring   CD Lubertha Leite, MD Pulmonary and [place] Pager:  [phone]            After 3:00 PM  [phone]  01/03/2017, 10:15 AM

## 2017-01-04 LAB — BASIC METABOLIC PANEL
Anion gap: 13 (ref 5–15)
BUN: 15 mg/dL (ref 6–20)
CO2: 13 mmol/L — ABNORMAL LOW (ref 22–32)
CREATININE: 2.86 mg/dL — AB (ref 0.44–1.00)
Calcium: 8.2 mg/dL — ABNORMAL LOW (ref 8.9–10.3)
Chloride: 109 mmol/L (ref 101–111)
GFR calc Af Amer: 20 mL/min — ABNORMAL LOW (ref >60)
GFR, EST NON AFRICAN AMERICAN: 17 mL/min — AB (ref >60)
Glucose, Bld: 117 mg/dL — ABNORMAL HIGH (ref 65–99)
POTASSIUM: 3.3 mmol/L — AB (ref 3.5–5.1)
SODIUM: 135 mmol/L (ref 135–145)

## 2017-01-04 LAB — CBC
HCT: 32.9 % — ABNORMAL LOW (ref 36.0–46.0)
Hemoglobin: 10.8 g/dL — ABNORMAL LOW (ref 12.0–15.0)
MCH: 23.6 pg — AB (ref 26.0–34.0)
MCHC: 32.8 g/dL (ref 30.0–36.0)
MCV: 72 fL — ABNORMAL LOW (ref 78.0–100.0)
PLATELETS: 256 10*3/uL (ref 150–400)
RBC: 4.57 MIL/uL (ref 3.87–5.11)
RDW: 16.8 % — ABNORMAL HIGH (ref 11.5–15.5)
WBC: 17.6 10*3/uL — ABNORMAL HIGH (ref 4.0–10.5)

## 2017-01-04 LAB — PROTIME-INR
INR: 1.18
Prothrombin Time: 15.1 seconds (ref 11.4–15.2)

## 2017-01-04 LAB — GLUCOSE, CAPILLARY
GLUCOSE-CAPILLARY: 101 mg/dL — AB (ref 65–99)
Glucose-Capillary: 81 mg/dL (ref 65–99)
Glucose-Capillary: 63 mg/dL — ABNORMAL LOW (ref 65–99)
Glucose-Capillary: 104 mg/dL — ABNORMAL HIGH (ref 65–99)
Glucose-Capillary: 103 mg/dL — ABNORMAL HIGH (ref 65–99)

## 2017-01-04 MED ORDER — POTASSIUM CHLORIDE CRYS ER 20 MEQ PO TBCR
20.0000 meq | EXTENDED_RELEASE_TABLET | Freq: Once | ORAL | Status: AC
Start: 2017-01-04 — End: 2017-01-04
  Administered 2017-01-04: 20 meq via ORAL
  Filled 2017-01-04: qty 1
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
## 2017-01-04 NOTE — Progress Notes (Signed)
Informed doctor of a cbg of 21 doctor ordered a repeat cbg I an hour

## 2017-01-05 LAB — CBC
HEMATOCRIT: 29.5 % — AB (ref 36.0–46.0)
HEMOGLOBIN: 9.4 g/dL — AB (ref 12.0–15.0)
MCH: 23 pg — AB (ref 26.0–34.0)
MCHC: 31.9 g/dL (ref 30.0–36.0)
MCV: 72.3 fL — AB (ref 78.0–100.0)
Platelets: 238 10*3/uL (ref 150–400)
RBC: 4.08 MIL/uL (ref 3.87–5.11)
RDW: 17 % — AB (ref 11.5–15.5)
WBC: 12.3 10*3/uL — ABNORMAL HIGH (ref 4.0–10.5)

## 2017-01-05 LAB — BASIC METABOLIC PANEL
Anion gap: 9 (ref 5–15)
BUN: 14 mg/dL (ref 6–20)
CALCIUM: 7.7 mg/dL — AB (ref 8.9–10.3)
CHLORIDE: 111 mmol/L (ref 101–111)
CO2: 15 mmol/L — AB (ref 22–32)
Creatinine, Ser: 2.62 mg/dL — ABNORMAL HIGH (ref 0.44–1.00)
GFR calc Af Amer: 22 mL/min — ABNORMAL LOW (ref >60)
GFR calc non Af Amer: 19 mL/min — ABNORMAL LOW (ref >60)
GLUCOSE: 163 mg/dL — AB (ref 65–99)
Potassium: 4.3 mmol/L (ref 3.5–5.1)
Sodium: 135 mmol/L (ref 135–145)

## 2017-01-05 LAB — GLUCOSE, CAPILLARY
GLUCOSE-CAPILLARY: 265 mg/dL — AB (ref 65–99)
Glucose-Capillary: 158 mg/dL — ABNORMAL HIGH (ref 65–99)
Glucose-Capillary: 180 mg/dL — ABNORMAL HIGH (ref 65–99)
Glucose-Capillary: 230 mg/dL — ABNORMAL HIGH (ref 65–99)

## 2017-01-05 MED ORDER — INSULIN GLARGINE 100 UNIT/ML ~~LOC~~ SOLN
18.0000 [IU] | Freq: Two times a day (BID) | SUBCUTANEOUS | Status: DC
  Administered 2017-01-05: 18 [IU] via SUBCUTANEOUS
  Filled 2017-01-05 (×3): qty 0.18

## 2017-01-05 MED ORDER — GABAPENTIN 600 MG PO TABS
300.0000 mg | ORAL_TABLET | Freq: Three times a day (TID) | ORAL | Status: DC
  Administered 2017-01-05 – 2017-01-11 (×17): 300 mg via ORAL
  Filled 2017-01-05 (×18): qty 1

## 2017-01-05 NOTE — Progress Notes (Signed)
   01/05/17 0940  Clinical Encounter Type  Visited With Patient  Visit Type Other (Comment) ([place] consult)  Spiritual Encounters  Spiritual Needs Prayer  Stress Factors  Patient Stress Factors None identified  Introduction to Pt. Offered prayer of peace and comfort.

## 2017-01-05 NOTE — Plan of Care (Signed)
Problem: Respiratory: Goal: Ability to achieve and maintain a regular respiratory rate will improve Outcome: Not Progressing Patient continues to experience SOB with activity. Will continue to monitor.

## 2017-01-05 NOTE — Progress Notes (Signed)
Physical Therapy Treatment Patient Details Name: Alejandra Hernandez MRN: [id number] DOB: 27-Jan-1956 Today's Date: 01/05/2017    History of Present Illness Pt adm with DKA and flu like symptoms. PMH - DM, gout, htn, pad, back pain    PT Comments    Pt progressing well toward goals, but continues to be limited by fatigue. Pt requires repeated v/c for proper RW use and not to initiate sit from stand prematurely for safety. Pt would continue to benefit from current d/c plans to continue to improve independence when medically ready. PT will continue to assess equipment recommendations as pt continues to progress until d/c. Will continue to follow.   Follow Up Recommendations  Home health PT;Supervision/Assistance - 24 hour     Equipment Recommendations  None recommended by PT (may need RW and O2 if doesn't improve )    Recommendations for Other Services       Precautions / Restrictions Precautions Precautions: Fall Precaution Comments: watch SpO2 Restrictions Weight Bearing Restrictions: No    Mobility  Bed Mobility Overal bed mobility: Needs Assistance Bed Mobility: Supine to Sit     Supine to sit: Supervision     General bed mobility comments: increased time; line management   Transfers Overall transfer level: Needs assistance Equipment used: Rolling walker (2 wheeled) Transfers: Sit to/from Stand Sit to Stand: Min guard         General transfer comment: increased time, v/c for push up from bed, guard for safety secondary to complaints of dizziness   Ambulation/Gait Ambulation/Gait assistance: Min guard Ambulation Distance (Feet): 100 Feet Assistive device: Rolling walker (2 wheeled) Gait Pattern/deviations: Step-through pattern;Decreased stride length;Trunk flexed;Wide base of support Gait velocity: decreased Gait velocity interpretation: Below normal speed for age/gender General Gait Details: pt on 2L O2 with SpO2 >94% during and following ambulation, v/c for RW  use   Stairs            Wheelchair Mobility    Modified Rankin (Stroke Patients Only)       Balance Overall balance assessment: Needs assistance Sitting-balance support: Feet supported;No upper extremity supported Sitting balance-Leahy Scale: Good Sitting balance - Comments: pt sat EOB x 3 min with no LOB   Standing balance support: Bilateral upper extremity supported Standing balance-Leahy Scale: Poor Standing balance comment: relies on B UE on RW                    Cognition Arousal/Alertness: Awake/alert Behavior During Therapy: Impulsive;Anxious;Agitated Overall Cognitive Status: Within Functional Limits for tasks assessed                 General Comments: easily frustrated but cooperative, initiates sit from stand prematurely, pt reports feeling nervous following ambulation    Exercises      General Comments        Pertinent Vitals/Pain Pain Assessment: 0-10 Pain Score: 5  Pain Location: low back Pain Descriptors / Indicators: Discomfort Pain Intervention(s): Limited activity within patient's tolerance;Monitored during session;Repositioned    Home Living                      Prior Function            PT Goals (current goals can now be found in the care plan section) Progress towards PT goals: Progressing toward goals    Frequency    Min 3X/week      PT Plan Current plan remains appropriate    Co-evaluation  End of Session Equipment Utilized During Treatment: Gait belt;Oxygen (2L O2) Activity Tolerance: Patient tolerated treatment well;Patient limited by fatigue Patient left: in chair;with call bell/phone within reach;with nursing/sitter in room     Time: [id number] PT Time Calculation (min) (ACUTE ONLY): 16 min  Charges:  $Gait Training: 8-22 mins                    G CodesTracie Harrier 02/03/2017, 4:47 PM   Tracie Harrier, SPT Acute Rehab SPT [phone]
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
## 2017-01-06 ENCOUNTER — Ambulatory Visit: Payer: Medicare Other | Admitting: Cardiovascular Disease

## 2017-01-06 LAB — BASIC METABOLIC PANEL
Anion gap: 12 (ref 5–15)
BUN: 15 mg/dL (ref 6–20)
CO2: 16 mmol/L — ABNORMAL LOW (ref 22–32)
CREATININE: 2.52 mg/dL — AB (ref 0.44–1.00)
Calcium: 7.8 mg/dL — ABNORMAL LOW (ref 8.9–10.3)
Chloride: 108 mmol/L (ref 101–111)
GFR calc Af Amer: 23 mL/min — ABNORMAL LOW (ref >60)
GFR, EST NON AFRICAN AMERICAN: 20 mL/min — AB (ref >60)
Glucose, Bld: 234 mg/dL — ABNORMAL HIGH (ref 65–99)
Potassium: 4.4 mmol/L (ref 3.5–5.1)
SODIUM: 136 mmol/L (ref 135–145)

## 2017-01-06 LAB — GLUCOSE, CAPILLARY
GLUCOSE-CAPILLARY: 215 mg/dL — AB (ref 65–99)
GLUCOSE-CAPILLARY: 269 mg/dL — AB (ref 65–99)
Glucose-Capillary: 100 mg/dL — ABNORMAL HIGH (ref 65–99)
Glucose-Capillary: 196 mg/dL — ABNORMAL HIGH (ref 65–99)

## 2017-01-06 LAB — CBC
HCT: 33.2 % — ABNORMAL LOW (ref 36.0–46.0)
Hemoglobin: 10.7 g/dL — ABNORMAL LOW (ref 12.0–15.0)
MCH: 23.7 pg — AB (ref 26.0–34.0)
MCHC: 32.2 g/dL (ref 30.0–36.0)
MCV: 73.5 fL — ABNORMAL LOW (ref 78.0–100.0)
PLATELETS: 287 10*3/uL (ref 150–400)
RBC: 4.52 MIL/uL (ref 3.87–5.11)
RDW: 17.3 % — ABNORMAL HIGH (ref 11.5–15.5)
WBC: 12.8 10*3/uL — ABNORMAL HIGH (ref 4.0–10.5)

## 2017-01-06 LAB — PROTIME-INR
INR: 1.25
PROTHROMBIN TIME: 15.7 s — AB (ref 11.4–15.2)

## 2017-01-06 MED ORDER — INSULIN ASPART 100 UNIT/ML ~~LOC~~ SOLN
0.0000 [IU] | Freq: Three times a day (TID) | SUBCUTANEOUS | Status: DC
  Administered 2017-01-06: 5 [IU] via SUBCUTANEOUS
  Administered 2017-01-07: 2 [IU] via SUBCUTANEOUS
  Administered 2017-01-07 (×2): 5 [IU] via SUBCUTANEOUS
  Administered 2017-01-08: 7 [IU] via SUBCUTANEOUS
  Administered 2017-01-08: 2 [IU] via SUBCUTANEOUS

## 2017-01-06 NOTE — Progress Notes (Signed)
PT Cancellation Note  Patient Details Name: Alejandra Hernandez MRN: [id number] DOB: 09/15/1956   Cancelled Treatment:    Reason Eval/Treat Not Completed: Patient declined, no reason specified.  Patient reports she is waiting to go for procedure.  Politely declined PT at this time.  Will return later as time allows.   [name] 01/06/2017, 1:31 PM [name]. [name], [place] Pager [phone]

## 2017-01-06 NOTE — Care Management Important Message (Signed)
Important Message  Patient Details  Name: Alejandra Hernandez MRN: [id number] Date of Birth: [birth year]   Medicare Important Message Given:  Yes    Nathen May 01/06/2017, 1:14 PM

## 2017-01-06 NOTE — Progress Notes (Signed)
PT Cancellation Note  Patient Details Name: DENNYS FRISINGER MRN: [id number] DOB: Aug 19, 1956   Cancelled Treatment:    Reason Eval/Treat Not Completed: Patient declined, no reason specified.  Attempted to see patient x3 - patient declined due to fatigue, visiting son.  Will return at later date.   KITTEN BAEDER 01/06/2017, 7:27 PM [name]. [name], [place] Pager [phone]

## 2017-01-06 NOTE — Progress Notes (Addendum)
Patient ID: Alejandra Hernandez, female   DOB: 1956-10-28, 61 y.o.   MRN: [id number]  IR aware of request for lung mass biopsy  Dr Earleen Newport has reviewed imaging and would like to discuss with ordering MD.  His pager and office phone were given to Dr Burr Medico  Medicine team will contact Dr Earleen Newport and will plan accordingly.

## 2017-01-06 NOTE — Progress Notes (Signed)
Chart reviewed.  Biopsy pending per IR. Pt made NPO 2/13.  PCCM will follow from periphery.  If CT guided biopsy does not yield diagnosis, please call PCCM back and we will review for FOB.     Noe Gens, NP-C [facility] Pgr: [phone] or if no answer [phone] 01/06/2017, 11:20 AM
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
## 2017-01-07 LAB — CBC
HCT: 29.7 % — ABNORMAL LOW (ref 36.0–46.0)
HEMOGLOBIN: 9.3 g/dL — AB (ref 12.0–15.0)
MCH: 23.2 pg — AB (ref 26.0–34.0)
MCHC: 31.3 g/dL (ref 30.0–36.0)
MCV: 74.1 fL — ABNORMAL LOW (ref 78.0–100.0)
PLATELETS: 314 10*3/uL (ref 150–400)
RBC: 4.01 MIL/uL (ref 3.87–5.11)
RDW: 17.9 % — ABNORMAL HIGH (ref 11.5–15.5)
WBC: 12.9 10*3/uL — ABNORMAL HIGH (ref 4.0–10.5)

## 2017-01-07 LAB — GLUCOSE, CAPILLARY
GLUCOSE-CAPILLARY: 259 mg/dL — AB (ref 65–99)
GLUCOSE-CAPILLARY: 313 mg/dL — AB (ref 65–99)
Glucose-Capillary: 158 mg/dL — ABNORMAL HIGH (ref 65–99)
Glucose-Capillary: 267 mg/dL — ABNORMAL HIGH (ref 65–99)

## 2017-01-07 LAB — BASIC METABOLIC PANEL
Anion gap: 12 (ref 5–15)
BUN: 13 mg/dL (ref 6–20)
CHLORIDE: 107 mmol/L (ref 101–111)
CO2: 15 mmol/L — ABNORMAL LOW (ref 22–32)
Calcium: 7.4 mg/dL — ABNORMAL LOW (ref 8.9–10.3)
Creatinine, Ser: 2.32 mg/dL — ABNORMAL HIGH (ref 0.44–1.00)
GFR, EST AFRICAN AMERICAN: 25 mL/min — AB (ref >60)
GFR, EST NON AFRICAN AMERICAN: 22 mL/min — AB (ref >60)
Glucose, Bld: 283 mg/dL — ABNORMAL HIGH (ref 65–99)
Potassium: 4.6 mmol/L (ref 3.5–5.1)
Sodium: 134 mmol/L — ABNORMAL LOW (ref 135–145)

## 2017-01-07 MED ORDER — INSULIN GLARGINE 100 UNIT/ML ~~LOC~~ SOLN
18.0000 [IU] | Freq: Two times a day (BID) | SUBCUTANEOUS | Status: DC
  Administered 2017-01-07: 18 [IU] via SUBCUTANEOUS
  Filled 2017-01-07 (×2): qty 0.18

## 2017-01-07 NOTE — Progress Notes (Signed)
Inpatient Diabetes Program Recommendations  AACE/ADA: New Consensus Statement on Inpatient Glycemic Control (2015)  Target Ranges:  Prepandial:   less than 140 mg/dL      Peak postprandial:   less than 180 mg/dL (1-2 hours)      Critically ill patients:  140 - 180 mg/dL   Lab Results  Component Value Date   GLUCAP 158 (H) 01/07/2017   HGBA1C >15.5 (H) 12/28/2016    Inpatient Diabetes Program Recommendations:    Noted that full Lantus was held while NPO.     Please consider half Lantus dose of 9 units BID while NPO for basal insulin.  Thank you,  Windy Carina, RN, MSN Diabetes Coordinator Inpatient Diabetes Program [phone] (Team Pager)

## 2017-01-07 NOTE — Progress Notes (Signed)
Name: Alejandra Hernandez MRN: [id number] DOB: 11/27/1955    ADMISSION DATE:  12/28/2016 CONSULTATION DATE:  01/03/17  REFERRING MD :  Dr. Fortunato Curling / [facility]   CHIEF COMPLAINT:  Lung Mass   BRIEF SUMMARY: 61 y/o F, smoker, admitted 2/4 with flu-like symptoms, DKA and incidental finding of RUL mass. PMH: DM II, CAD, HTN, gout, PAD, GIB  SUBJECTIVE: Pt anxious to eat, anxious to have procedure to confirm diagnosis   VITAL SIGNS: Temp:  [98.2 F (36.8 C)-98.6 F (37 C)] 98.2 F (36.8 C) (02/14 0439) Pulse Rate:  [83-93] 83 (02/14 0439) Resp:  [18-19] 18 (02/14 0439) BP: (120-145)/(61-64) 127/63 (02/14 0439) SpO2:  [93 %-98 %] 93 % (02/14 0439)  PHYSICAL EXAMINATION: General:  Obese female in NAD  Neuro:  AAOx4, MAE  HEENT:  MM pink/moist, no jvd  Cardiovascular:  s1s2 rrr, no m/r/g  Lungs:  Even/non-labored, lungs bilaterally clear, diminished RUL  Abdomen:  Obese/soft, bsx4 active  Musculoskeletal:  No acute deformities  Skin:  Warm/dry, no edema    Recent Labs Lab 01/05/17 0338 01/06/17 0325 01/07/17 0431  NA 135 136 134*  K 4.3 4.4 4.6  CL 111 108 107  CO2 15* 16* 15*  BUN 14 15 13   CREATININE 2.62* 2.52* 2.32*  GLUCOSE 163* 234* 283*     Recent Labs Lab 01/05/17 0338 01/06/17 0325 01/07/17 0431  HGB 9.4* 10.7* 9.3*  HCT 29.5* 33.2* 29.7*  WBC 12.3* 12.8* 12.9*  PLT 238 287 314    No results found.    SIGNIFICANT EVENTS  2/04  Admit  2/09  CT chest with RUL mass 2/10  PCCM consulted for RUL mass   STUDIES:  CT Chest 2/9 >> 7.5 cm anterior RUL mass concerning for malignancy, associated mildly enlarged mediastinal LAN, small to moderate R pleural effusion, centrilobular emphysema, small left pleural effusion, CAD    ASSESSMENT / PLAN:  RUL Mass - concern for bronchogenic carcinoma.  IR declined needle biopsy as it was felt by Dr. Earleen Newport that it would be more easily accessed by FOB.    Plan: EBUS arranged for 2/16 at 0730 per Dr. Lamonte Sakai.  Case #  [id number], in OR room 10 NPO after MN 2/16 Allow diet for now  Interstitial Infiltrate RUL - metastatic invasion vs mass effect with atelectasis  Plan: Monitor CXR  Continue PRN breathing treatments  Completed Rx for CAP with rocephin / azithro (2/4-2/8)  Pleural Effusion   Plan: Consider assessment with [place]    Brandi Ollis, NP-C [facility] Pgr: [phone] or if no answer [phone] 01/07/2017, 11:17 AM  STAFF NOTE: I, Merrie Roof, MD FACP have personally reviewed patient's available data, including medical history, events of note, physical examination and test results as part of my evaluation. I have discussed with resident/NP and other care providers such as pharmacist, RN and RRT. In addition, I personally evaluated patient and elicited key findings of:  Awake in bed, sitting on edge, NO distress, no lymphad on neck, no eghophany rt apical, coarse rt upper and slight reduced, she has received abx prior, CT reviewed and highly suscpious for malignancy, given above would proceed with Bronch ebus and bx, D.w Dr Lamonte Sakai, he will perform in am 7 Friday am, updated pt in full, she was neg 970 cc, follow crt with this neg balance, will follo wup after bronch ebus   Lavon Paganini. Titus Mould, MD, [place] Pgr: [facility] 01/07/2017 3:32 PM

## 2017-01-07 NOTE — Progress Notes (Signed)
Physical Therapy Treatment Patient Details Name: Alejandra Hernandez MRN: [id number] DOB: 1956-05-17 Today's Date: 01/07/2017    History of Present Illness Pt adm with DKA and flu like symptoms. PMH - DM, gout, htn, pad, back pain    PT Comments    Pt was seen for evaluation of her tolerance for gait, noted pulses 107-114 with gait then sats 87% with gait and back to 90% at rest.  Her plan is to continue acutely for strengthening and  Control of posture, and to determine her safe distances with drops in O2 sats since pt not as symptomatic per her report.  Follow Up Recommendations  Home health PT;Supervision/Assistance - 24 hour     Equipment Recommendations  Other (comment) (RW and O2 needed)    Recommendations for Other Services       Precautions / Restrictions Precautions Precautions: Fall Precaution Comments: watch SpO2 Restrictions Weight Bearing Restrictions: No    Mobility  Bed Mobility               General bed mobility comments: sitting side of bed when PT arrived  Transfers Overall transfer level: Needs assistance Equipment used: Rolling walker (2 wheeled) Transfers: Sit to/from [facility] Sit to Stand: Supervision Stand pivot transfers: Supervision       General transfer comment: pt remembers correct hand placement for standign at walker  Ambulation/Gait Ambulation/Gait assistance: Min guard (for safety) Ambulation Distance (Feet): 175 Feet Assistive device: Rolling walker (2 wheeled) Gait Pattern/deviations: Step-through pattern;Decreased stride length;Wide base of support;Trunk flexed Gait velocity: decreased Gait velocity interpretation: Below normal speed for age/gender General Gait Details: On 2L O2 with sats 87% with gait, plse 114   Stairs            Wheelchair Mobility    Modified Rankin (Stroke Patients Only)       Balance     Sitting balance-Leahy Scale: Good     Standing balance support: Bilateral  upper extremity supported Standing balance-Leahy Scale: Fair Standing balance comment: less than fair dynamic balance                    Cognition Arousal/Alertness: Awake/alert Behavior During Therapy: WFL for tasks assessed/performed Overall Cognitive Status: Within Functional Limits for tasks assessed                      Exercises      General Comments        Pertinent Vitals/Pain Pain Assessment: No/denies pain    Home Living                      Prior Function            PT Goals (current goals can now be found in the care plan section) Acute Rehab PT Goals Patient Stated Goal: to try and not need O2 for home PT Goal Formulation: With patient Progress towards PT goals: Progressing toward goals    Frequency    Min 3X/week      PT Plan Current plan remains appropriate    Co-evaluation             End of Session Equipment Utilized During Treatment: Gait belt;Oxygen Activity Tolerance: Patient tolerated treatment well;Patient limited by fatigue Patient left: in bed;with call bell/phone within reach;Other (comment) (sitting unsupported on bed)     Time: [id number] PT Time Calculation (min) (ACUTE ONLY): 17 min  Charges:  $Gait Training: 8-22 mins  G Codes:      Ramond Dial 01/07/2017, 2:32 PM   Mee Hives, PT MS Acute Rehab Dept. Number: [place] and [place]
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
## 2017-01-08 ENCOUNTER — Encounter (HOSPITAL_COMMUNITY): Payer: Self-pay | Admitting: Anesthesiology

## 2017-01-08 LAB — BASIC METABOLIC PANEL
ANION GAP: 9 (ref 5–15)
BUN: 16 mg/dL (ref 6–20)
CALCIUM: 7.8 mg/dL — AB (ref 8.9–10.3)
CO2: 17 mmol/L — AB (ref 22–32)
Chloride: 108 mmol/L (ref 101–111)
Creatinine, Ser: 2.46 mg/dL — ABNORMAL HIGH (ref 0.44–1.00)
GFR, EST AFRICAN AMERICAN: 23 mL/min — AB (ref >60)
GFR, EST NON AFRICAN AMERICAN: 20 mL/min — AB (ref >60)
Glucose, Bld: 294 mg/dL — ABNORMAL HIGH (ref 65–99)
Potassium: 4.3 mmol/L (ref 3.5–5.1)
SODIUM: 134 mmol/L — AB (ref 135–145)

## 2017-01-08 LAB — GLUCOSE, CAPILLARY
GLUCOSE-CAPILLARY: 231 mg/dL — AB (ref 65–99)
GLUCOSE-CAPILLARY: 303 mg/dL — AB (ref 65–99)
GLUCOSE-CAPILLARY: 203 mg/dL — AB (ref 65–99)
GLUCOSE-CAPILLARY: 125 mg/dL — AB (ref 65–99)

## 2017-01-08 LAB — CBC
HEMATOCRIT: 30.3 % — AB (ref 36.0–46.0)
Hemoglobin: 9.6 g/dL — ABNORMAL LOW (ref 12.0–15.0)
MCH: 23.5 pg — ABNORMAL LOW (ref 26.0–34.0)
MCHC: 31.7 g/dL (ref 30.0–36.0)
MCV: 74.1 fL — ABNORMAL LOW (ref 78.0–100.0)
Platelets: 329 10*3/uL (ref 150–400)
RBC: 4.09 MIL/uL (ref 3.87–5.11)
RDW: 17.7 % — AB (ref 11.5–15.5)
WBC: 14.3 10*3/uL — AB (ref 4.0–10.5)

## 2017-01-08 MED ORDER — INSULIN GLARGINE 100 UNIT/ML ~~LOC~~ SOLN: 18.0000 [IU] | Freq: Every day | SUBCUTANEOUS | Status: DC

## 2017-01-08 MED ORDER — INSULIN GLARGINE 100 UNIT/ML ~~LOC~~ SOLN
18.0000 [IU] | Freq: Every day | SUBCUTANEOUS | Status: AC
  Administered 2017-01-08: 18 [IU] via SUBCUTANEOUS
  Filled 2017-01-08: qty 0.18

## 2017-01-08 MED ORDER — INSULIN ASPART 100 UNIT/ML ~~LOC~~ SOLN
0.0000 [IU] | Freq: Three times a day (TID) | SUBCUTANEOUS | Status: DC
  Administered 2017-01-08: 5 [IU] via SUBCUTANEOUS

## 2017-01-08 NOTE — Anesthesia Preprocedure Evaluation (Addendum)
Anesthesia Evaluation  Patient identified by MRN, date of birth, ID band Patient awake    Reviewed: Allergy & Precautions, NPO status , Patient's Chart, lab work & pertinent test results  Airway Mallampati: III  TM Distance: >3 FB Neck ROM: Full    Dental  (+) Dental Advisory Given, Edentulous Upper   Pulmonary former smoker,     + decreased breath sounds+ wheezing      Cardiovascular hypertension, Pt. on medications + Peripheral Vascular Disease   Rhythm:Regular Rate:Normal     Neuro/Psych  Headaches, PSYCHIATRIC DISORDERS Anxiety  Neuromuscular disease    GI/Hepatic PUD, GERD  Medicated,  Endo/Other  diabetes, Type 2, Insulin Dependent  Renal/GU CRFRenal disease  negative genitourinary   Musculoskeletal  (+) Arthritis ,   Abdominal   Peds negative pediatric ROS (+)  Hematology negative hematology ROS (+)   Anesthesia Other Findings   Reproductive/Obstetrics negative OB ROS                          Lab Results  Component Value Date   WBC 14.3 (H) 01/08/2017   HGB 9.6 (L) 01/08/2017   HCT 30.3 (L) 01/08/2017   MCV 74.1 (L) 01/08/2017   PLT 329 01/08/2017   Lab Results  Component Value Date   CREATININE 2.46 (H) 01/08/2017   BUN 16 01/08/2017   NA 134 (L) 01/08/2017   K 4.3 01/08/2017   CL 108 01/08/2017   CO2 17 (L) 01/08/2017   Lab Results  Component Value Date   INR 1.25 01/06/2017   INR 1.18 01/04/2017   INR 1.04 07/23/2016   Echo - Left ventricle: The cavity size was normal. There was mild   concentric hypertrophy. Systolic function was vigorous. The   estimated ejection fraction was in the range of 65% to 70%. Wall   motion was normal; there were no regional wall motion   abnormalities. Doppler parameters are consistent with abnormal   left ventricular relaxation (grade 1 diastolic dysfunction).   There was no evidence of elevated ventricular filling pressure by    Doppler parameters. - Aortic valve: There was no regurgitation. - Aortic root: The aortic root was normal in size. - Mitral valve: Structurally normal valve. There was no   regurgitation. - Right ventricle: The cavity size was normal. Wall thickness was   normal. Systolic function was normal. - Right atrium: The atrium was normal in size. - Tricuspid valve: There was mild regurgitation. - Pulmonary arteries: Systolic pressure was within the normal   range. PA peak pressure: 31 mm Hg (S). - Inferior vena cava: The vessel was normal in size. - Pericardium, extracardiac: There was no pericardial effusion.   Anesthesia Physical Anesthesia Plan  ASA: III  Anesthesia Plan: General   Post-op Pain Management:    Induction: Intravenous  Airway Management Planned: Oral ETT  Additional Equipment:   Intra-op Plan:   Post-operative Plan: Extubation in OR  Informed Consent: I have reviewed the patients History and Physical, chart, labs and discussed the procedure including the risks, benefits and alternatives for the proposed anesthesia with the patient or authorized representative who has indicated his/her understanding and acceptance.   Dental advisory given  Plan Discussed with: CRNA  Anesthesia Plan Comments: (Discussed possibility of prolonged intubation due to pulmonary status.)       Anesthesia Quick Evaluation
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
## 2017-01-08 NOTE — Care Management Note (Signed)
Case Management Note Previous CM note initiated by Zenon Mayo, RN-12/29/2016, 3:00 PM    Patient Details  Name: Alejandra Hernandez MRN: [id number] Date of Birth: October 06, 1956  Subjective/Objective:   Presents with DKA, CAP, elevated lactic acid, aki, elevated trops-demand ischemia, htn, anemia, pad, and chronic back pain,  Await pt eval. NCM will cont to follow for dc needs.                Action/Plan: 01/02/17- pt tx from 4E to 2W- PTA pt from home-   Expected Discharge Date:                  Expected Discharge Plan:  Home/Self Care  In-House Referral:     Discharge planning Services  CM Consult  Post Acute Care Choice:  Home Health, Durable Medical Equipment Choice offered to:  Patient  DME Arranged:    DME Agency:     HH Arranged:    [place] Agency:     Status of Service:  In process, will continue to follow  If discussed at Long Length of Stay Meetings, dates discussed:  2/13, 2/15  Additional Comments:  01/08/17- Marvetta Gibbons RN, - Per PT eval recommendation for HHPT- will need orders prior to discharge. plan for bronch on 2/15 for lung bx- CM to continue to follow for d/c needs   Dawayne Patricia, RN 01/08/2017, 10:41 AM [phone]

## 2017-01-09 ENCOUNTER — Inpatient Hospital Stay (HOSPITAL_COMMUNITY): Payer: Medicare Other

## 2017-01-09 ENCOUNTER — Inpatient Hospital Stay (HOSPITAL_COMMUNITY): Payer: Medicare Other | Admitting: Anesthesiology

## 2017-01-09 ENCOUNTER — Encounter (HOSPITAL_COMMUNITY)
Admission: EM | Disposition: A | Discharge status: Home / Self Care | Payer: Self-pay | Source: Home / Self Care | Attending: Family Medicine

## 2017-01-09 ENCOUNTER — Encounter (HOSPITAL_COMMUNITY): Payer: Self-pay | Admitting: Certified Registered Nurse Anesthetist

## 2017-01-09 DIAGNOSIS — I2699 Other pulmonary embolism without acute cor pulmonale: Secondary | ICD-10-CM

## 2017-01-09 HISTORY — PX: VIDEO BRONCHOSCOPY WITH ENDOBRONCHIAL ULTRASOUND: SHX6177

## 2017-01-09 LAB — CBC
HCT: 30.9 % — ABNORMAL LOW (ref 36.0–46.0)
HEMOGLOBIN: 9.8 g/dL — AB (ref 12.0–15.0)
MCH: 23.4 pg — AB (ref 26.0–34.0)
MCHC: 31.7 g/dL (ref 30.0–36.0)
MCV: 73.7 fL — ABNORMAL LOW (ref 78.0–100.0)
PLATELETS: 402 10*3/uL — AB (ref 150–400)
RBC: 4.19 MIL/uL (ref 3.87–5.11)
RDW: 17.4 % — ABNORMAL HIGH (ref 11.5–15.5)
WBC: 13.5 10*3/uL — ABNORMAL HIGH (ref 4.0–10.5)

## 2017-01-09 LAB — BASIC METABOLIC PANEL
Anion gap: 10 (ref 5–15)
BUN: 14 mg/dL (ref 6–20)
CHLORIDE: 109 mmol/L (ref 101–111)
CO2: 19 mmol/L — ABNORMAL LOW (ref 22–32)
Calcium: 8.2 mg/dL — ABNORMAL LOW (ref 8.9–10.3)
Creatinine, Ser: 2.46 mg/dL — ABNORMAL HIGH (ref 0.44–1.00)
GFR calc Af Amer: 23 mL/min — ABNORMAL LOW (ref >60)
GFR calc non Af Amer: 20 mL/min — ABNORMAL LOW (ref >60)
GLUCOSE: 127 mg/dL — AB (ref 65–99)
POTASSIUM: 4.2 mmol/L (ref 3.5–5.1)
SODIUM: 138 mmol/L (ref 135–145)

## 2017-01-09 LAB — GLUCOSE, CAPILLARY
GLUCOSE-CAPILLARY: 129 mg/dL — AB (ref 65–99)
GLUCOSE-CAPILLARY: 161 mg/dL — AB (ref 65–99)
GLUCOSE-CAPILLARY: 186 mg/dL — AB (ref 65–99)
GLUCOSE-CAPILLARY: 244 mg/dL — AB (ref 65–99)
Glucose-Capillary: 203 mg/dL — ABNORMAL HIGH (ref 65–99)

## 2017-01-09 SURGERY — BRONCHOSCOPY, WITH EBUS
Anesthesia: General | Site: Chest

## 2017-01-09 MED ORDER — DEXMEDETOMIDINE HCL IN NACL 200 MCG/50ML IV SOLN
INTRAVENOUS | Status: DC | PRN
  Administered 2017-01-09: 0.7 ug/kg/h via INTRAVENOUS

## 2017-01-09 MED ORDER — ONDANSETRON HCL 4 MG/2ML IJ SOLN
INTRAMUSCULAR | Status: AC
  Filled 2017-01-09: qty 2

## 2017-01-09 MED ORDER — LACTATED RINGERS IV SOLN: INTRAVENOUS | Status: DC

## 2017-01-09 MED ORDER — MEPERIDINE HCL 25 MG/ML IJ SOLN: 6.2500 mg | INTRAMUSCULAR | Status: DC | PRN

## 2017-01-09 MED ORDER — INSULIN ASPART 100 UNIT/ML ~~LOC~~ SOLN
2.0000 [IU] | SUBCUTANEOUS | Status: DC
  Administered 2017-01-09: 4 [IU] via SUBCUTANEOUS
  Administered 2017-01-09: 6 [IU] via SUBCUTANEOUS

## 2017-01-09 MED ORDER — PROPOFOL 10 MG/ML IV BOLUS
INTRAVENOUS | Status: AC
  Filled 2017-01-09: qty 40

## 2017-01-09 MED ORDER — MIDAZOLAM HCL 5 MG/5ML IJ SOLN
INTRAMUSCULAR | Status: DC | PRN
  Administered 2017-01-09: 1 mg via INTRAVENOUS

## 2017-01-09 MED ORDER — LIDOCAINE 2% (20 MG/ML) 5 ML SYRINGE
INTRAMUSCULAR | Status: AC
  Filled 2017-01-09: qty 5

## 2017-01-09 MED ORDER — PROMETHAZINE HCL 25 MG/ML IJ SOLN: 6.2500 mg | INTRAMUSCULAR | Status: DC | PRN

## 2017-01-09 MED ORDER — DEXMEDETOMIDINE HCL IN NACL 200 MCG/50ML IV SOLN
INTRAVENOUS | Status: AC
  Filled 2017-01-09: qty 50

## 2017-01-09 MED ORDER — HYDROMORPHONE HCL 1 MG/ML IJ SOLN: 0.2500 mg | INTRAMUSCULAR | Status: DC | PRN

## 2017-01-09 MED ORDER — LIDOCAINE 2% (20 MG/ML) 5 ML SYRINGE
INTRAMUSCULAR | Status: DC | PRN
Start: 2017-01-09 — End: 2017-01-09
  Administered 2017-01-09: 60 mg via INTRAVENOUS

## 2017-01-09 MED ORDER — OXYCODONE HCL 5 MG PO TABS
5.0000 mg | ORAL_TABLET | Freq: Four times a day (QID) | ORAL | Status: DC | PRN
  Administered 2017-01-10 – 2017-01-11 (×3): 5 mg via ORAL
  Filled 2017-01-09 (×3): qty 1

## 2017-01-09 MED ORDER — NEOSTIGMINE METHYLSULFATE 5 MG/5ML IV SOSY
PREFILLED_SYRINGE | INTRAVENOUS | Status: AC
  Filled 2017-01-09: qty 5

## 2017-01-09 MED ORDER — SODIUM CHLORIDE 0.9 % IV SOLN
INTRAVENOUS | Status: DC | PRN
  Administered 2017-01-09: 07:00:00 via INTRAVENOUS

## 2017-01-09 MED ORDER — FENTANYL CITRATE (PF) 100 MCG/2ML IJ SOLN
25.0000 ug | INTRAMUSCULAR | Status: DC | PRN
Start: 2017-01-09 — End: 2017-01-09

## 2017-01-09 MED ORDER — SUCCINYLCHOLINE CHLORIDE 200 MG/10ML IV SOSY
PREFILLED_SYRINGE | INTRAVENOUS | Status: AC
Start: 2017-01-09 — End: 2017-01-09
  Filled 2017-01-09: qty 10

## 2017-01-09 MED ORDER — SUCCINYLCHOLINE CHLORIDE 200 MG/10ML IV SOSY
PREFILLED_SYRINGE | INTRAVENOUS | Status: DC | PRN
  Administered 2017-01-09: 100 mg via INTRAVENOUS

## 2017-01-09 MED ORDER — INSULIN GLARGINE 100 UNIT/ML ~~LOC~~ SOLN
18.0000 [IU] | SUBCUTANEOUS | Status: DC
  Administered 2017-01-09 – 2017-01-10 (×2): 18 [IU] via SUBCUTANEOUS
  Filled 2017-01-09 (×3): qty 0.18

## 2017-01-09 MED ORDER — PROPOFOL 10 MG/ML IV BOLUS
INTRAVENOUS | Status: DC | PRN
  Administered 2017-01-09: 100 mg via INTRAVENOUS
  Administered 2017-01-09: 140 mg via INTRAVENOUS
  Administered 2017-01-09: 10 mg via INTRAVENOUS

## 2017-01-09 MED ORDER — ROCURONIUM BROMIDE 50 MG/5ML IV SOSY
PREFILLED_SYRINGE | INTRAVENOUS | Status: AC
  Filled 2017-01-09: qty 5

## 2017-01-09 MED ORDER — SUGAMMADEX SODIUM 200 MG/2ML IV SOLN
100.0000 mg | Freq: Once | INTRAVENOUS | Status: AC
  Administered 2017-01-09: 100 mg via INTRAVENOUS

## 2017-01-09 MED ORDER — NEOSTIGMINE METHYLSULFATE 5 MG/5ML IV SOSY
PREFILLED_SYRINGE | INTRAVENOUS | Status: DC | PRN
  Administered 2017-01-09: 4 mg via INTRAVENOUS

## 2017-01-09 MED ORDER — FENTANYL CITRATE (PF) 100 MCG/2ML IJ SOLN
INTRAMUSCULAR | Status: DC | PRN
  Administered 2017-01-09 (×3): 50 ug via INTRAVENOUS

## 2017-01-09 MED ORDER — MIDAZOLAM HCL 2 MG/2ML IJ SOLN
INTRAMUSCULAR | Status: AC
  Filled 2017-01-09: qty 2

## 2017-01-09 MED ORDER — ONDANSETRON HCL 4 MG/2ML IJ SOLN
INTRAMUSCULAR | Status: DC | PRN
  Administered 2017-01-09: 4 mg via INTRAVENOUS

## 2017-01-09 MED ORDER — FENTANYL CITRATE (PF) 100 MCG/2ML IJ SOLN
INTRAMUSCULAR | Status: AC
  Filled 2017-01-09: qty 4

## 2017-01-09 MED ORDER — ROCURONIUM BROMIDE 10 MG/ML (PF) SYRINGE
PREFILLED_SYRINGE | INTRAVENOUS | Status: DC | PRN
  Administered 2017-01-09: 40 mg via INTRAVENOUS

## 2017-01-09 MED ORDER — OXYCODONE-ACETAMINOPHEN 5-325 MG PO TABS
1.0000 | ORAL_TABLET | Freq: Four times a day (QID) | ORAL | Status: DC | PRN
  Administered 2017-01-10 – 2017-01-11 (×4): 1 via ORAL
  Filled 2017-01-09 (×4): qty 1

## 2017-01-09 MED ORDER — GLYCOPYRROLATE 0.2 MG/ML IV SOSY
PREFILLED_SYRINGE | INTRAVENOUS | Status: DC | PRN
  Administered 2017-01-09: 0.6 mg via INTRAVENOUS

## 2017-01-09 MED ORDER — PHENYLEPHRINE HCL 10 MG/ML IJ SOLN
INTRAVENOUS | Status: DC | PRN
  Administered 2017-01-09: 20 ug/min via INTRAVENOUS

## 2017-01-09 MED ORDER — 0.9 % SODIUM CHLORIDE (POUR BTL) OPTIME
TOPICAL | Status: DC | PRN
  Administered 2017-01-09: 1000 mL

## 2017-01-09 SURGICAL SUPPLY — 29 items
BRUSH CYTOL CELLEBRITY 1.5X140 (MISCELLANEOUS) IMPLANT
BRUSH CYTOL CELLEBRITY 1.9X150 (MISCELLANEOUS) ×6 IMPLANT
CANISTER SUCTION 2500CC (MISCELLANEOUS) ×3 IMPLANT
CONT SPEC 4OZ CLIKSEAL STRL BL (MISCELLANEOUS) ×3 IMPLANT
COVER DOME SNAP 22 D (MISCELLANEOUS) ×3 IMPLANT
COVER TABLE BACK 60X90 (DRAPES) ×3 IMPLANT
FORCEPS BIOP RJ4 1.8 (CUTTING FORCEPS) IMPLANT
GAUZE SPONGE 4X4 12PLY STRL (GAUZE/BANDAGES/DRESSINGS) ×3 IMPLANT
GLOVE BIO SURGEON STRL SZ7.5 (GLOVE) ×3 IMPLANT
GOWN STRL REUS W/ TWL LRG LVL3 (GOWN DISPOSABLE) ×1 IMPLANT
GOWN STRL REUS W/TWL LRG LVL3 (GOWN DISPOSABLE) ×2
KIT CLEAN ENDO COMPLIANCE (KITS) ×6 IMPLANT
KIT ROOM TURNOVER OR (KITS) ×3 IMPLANT
MARKER SKIN DUAL TIP RULER LAB (MISCELLANEOUS) ×3 IMPLANT
NEEDLE BIOPSY TRANSBRONCH 21G (NEEDLE) IMPLANT
NEEDLE EBUS SONO TIP PENTAX (NEEDLE) ×3 IMPLANT
NS IRRIG 1000ML POUR BTL (IV SOLUTION) ×3 IMPLANT
OIL SILICONE PENTAX (PARTS (SERVICE/REPAIRS)) ×3 IMPLANT
PAD ARMBOARD 7.5X6 YLW CONV (MISCELLANEOUS) ×6 IMPLANT
SYR 20CC LL (SYRINGE) ×3 IMPLANT
SYR 20ML ECCENTRIC (SYRINGE) ×6 IMPLANT
SYR 50ML LL SCALE MARK (SYRINGE) IMPLANT
SYR 50ML SLIP (SYRINGE) IMPLANT
SYR 5ML LUER SLIP (SYRINGE) ×3 IMPLANT
TOWEL OR 17X24 6PK STRL BLUE (TOWEL DISPOSABLE) ×3 IMPLANT
TRAP SPECIMEN MUCOUS 40CC (MISCELLANEOUS) IMPLANT
TUBE CONNECTING 20'X1/4 (TUBING) ×2
TUBE CONNECTING 20X1/4 (TUBING) ×4 IMPLANT
WATER STERILE IRR 1000ML POUR (IV SOLUTION) ×3 IMPLANT

## 2017-01-09 NOTE — Progress Notes (Signed)
Paged family med for pain medicine. Pt requests to be back on it since she takes it for chronic pain. Med was Dcd. See orders.

## 2017-01-09 NOTE — Anesthesia Procedure Notes (Signed)
Procedure Name: Intubation Date/Time: 01/09/2017 7:52 AM Performed by: Garrison [place] T Pre-anesthesia Checklist: Patient identified, Emergency Drugs available, Suction available and Patient being monitored Patient Re-evaluated:Patient Re-evaluated prior to inductionOxygen Delivery Method: Circle System Utilized Preoxygenation: Pre-oxygenation with 100% oxygen Intubation Type: IV induction Ventilation: Mask ventilation without difficulty Laryngoscope Size: Miller and 2 Grade View: Grade I Tube type: Oral Tube size: 8.5 mm Number of attempts: 1 Airway Equipment and Method: Stylet and Oral airway Placement Confirmation: ETT inserted through vocal cords under direct vision,  positive ETCO2 and breath sounds checked- equal and bilateral Secured at: 22 cm Tube secured with: Tape Dental Injury: Teeth and Oropharynx as per pre-operative assessment

## 2017-01-09 NOTE — Transfer of Care (Signed)
Immediate Anesthesia Transfer of Care Note  Patient: Alejandra Hernandez  Procedure(s) Performed: Procedure(s): VIDEO BRONCHOSCOPY WITH ENDOBRONCHIAL ULTRASOUND (N/A)  Patient Location: PACU  Anesthesia Type:General  Level of Consciousness: sedated and Patient remains intubated per anesthesia plan  Airway & Oxygen Therapy: Patient remains intubated per anesthesia plan and Patient placed on Ventilator (see vital sign flow sheet for setting)  Post-op Assessment: Report given to RN and Post -op Vital signs reviewed and stable  Post vital signs: Reviewed and stable  Last Vitals:  Vitals:   01/08/17 2122 01/09/17 0322  BP: (!) 128/58 118/66  Pulse: 74 71  Resp: 16 16  Temp: 36.5 C 36.9 C    Last Pain:  Vitals:   01/09/17 0322  TempSrc: Oral  PainSc:       Patients Stated Pain Goal: 0 ([id number])  Complications: No apparent anesthesia complications

## 2017-01-09 NOTE — H&P (View-Only) (Signed)
Name: Alejandra Hernandez MRN: [id number] DOB: 07-29-1956    ADMISSION DATE:  12/28/2016 CONSULTATION DATE:  01/03/17  REFERRING MD :  Dr. Fortunato Curling / [facility]   CHIEF COMPLAINT:  Lung Mass   BRIEF SUMMARY: 61 y/o F, smoker, admitted 2/4 with flu-like symptoms, DKA and incidental finding of RUL mass. PMH: DM II, CAD, HTN, gout, PAD, GIB  SUBJECTIVE: Pt anxious to eat, anxious to have procedure to confirm diagnosis   VITAL SIGNS: Temp:  [98.2 F (36.8 C)-98.6 F (37 C)] 98.2 F (36.8 C) (02/14 0439) Pulse Rate:  [83-93] 83 (02/14 0439) Resp:  [18-19] 18 (02/14 0439) BP: (120-145)/(61-64) 127/63 (02/14 0439) SpO2:  [93 %-98 %] 93 % (02/14 0439)  PHYSICAL EXAMINATION: General:  Obese female in NAD  Neuro:  AAOx4, MAE  HEENT:  MM pink/moist, no jvd  Cardiovascular:  s1s2 rrr, no m/r/g  Lungs:  Even/non-labored, lungs bilaterally clear, diminished RUL  Abdomen:  Obese/soft, bsx4 active  Musculoskeletal:  No acute deformities  Skin:  Warm/dry, no edema    Recent Labs Lab 01/05/17 0338 01/06/17 0325 01/07/17 0431  NA 135 136 134*  K 4.3 4.4 4.6  CL 111 108 107  CO2 15* 16* 15*  BUN 14 15 13   CREATININE 2.62* 2.52* 2.32*  GLUCOSE 163* 234* 283*     Recent Labs Lab 01/05/17 0338 01/06/17 0325 01/07/17 0431  HGB 9.4* 10.7* 9.3*  HCT 29.5* 33.2* 29.7*  WBC 12.3* 12.8* 12.9*  PLT 238 287 314    No results found.    SIGNIFICANT EVENTS  2/04  Admit  2/09  CT chest with RUL mass 2/10  PCCM consulted for RUL mass   STUDIES:  CT Chest 2/9 >> 7.5 cm anterior RUL mass concerning for malignancy, associated mildly enlarged mediastinal LAN, small to moderate R pleural effusion, centrilobular emphysema, small left pleural effusion, CAD    ASSESSMENT / PLAN:  RUL Mass - concern for bronchogenic carcinoma.  IR declined needle biopsy as it was felt by Dr. Earleen Newport that it would be more easily accessed by FOB.    Plan: EBUS arranged for 2/16 at 0730 per Dr. Lamonte Sakai.  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 I have discussed with resident/NP and other care providers such as pharmacist, RN and RRT. In addition, I personally evaluated patient and elicited key findings of:  Awake in bed, sitting on edge, NO distress, no lymphad on neck, no eghophany rt apical, coarse rt upper and slight reduced, she has received abx prior, CT reviewed and highly suscpious for malignancy, given above would proceed with Bronch ebus and bx, D.w Dr Lamonte Sakai, he will perform in am 7 Friday am, updated pt in full, she was neg 970 cc, follow crt with this neg balance, will follo wup after bronch ebus   Lavon Paganini. Titus Mould, MD, [place] Pgr: [facility] 01/07/2017 3:32 PM

## 2017-01-09 NOTE — Anesthesia Postprocedure Evaluation (Addendum)
Anesthesia Post Note  Patient: Alejandra Hernandez  Procedure(s) Performed: Procedure(s) (LRB): VIDEO BRONCHOSCOPY WITH ENDOBRONCHIAL ULTRASOUND (N/A)  Patient location during evaluation: PACU Anesthesia Type: General Level of consciousness: awake and alert Pain management: pain level controlled Vital Signs Assessment: post-procedure vital signs reviewed and stable Respiratory status: spontaneous breathing, nonlabored ventilation, respiratory function stable and patient connected to nasal cannula oxygen Cardiovascular status: blood pressure returned to baseline and stable Postop Assessment: no signs of nausea or vomiting Anesthetic complications: no Comments: Pt extubated in OR after receiving full NMB reversal and meeting clinical criteria for extubation. After extubation, [name]' breathing pattern became labored, she complained of difficultly breathing and her motor skills were uncoordinated. We decided to reintubate and evaluate further since her pulmonary status was tenuous prior to the procedure and her renal insufficiency could be a factor. She was transported intubated to PACU and placed on a travel ventilator with precedex gtt. Upon entering PACU shortly after she was placed on ventilator, I ordered precedex and ventilator wean as tolerated. Around 11:00 AM, I was notified [name] had an O2 saturation of 88% on 50% O2 with difficultly breathing on PS and inability to sustain head lift or hand grip. I notified Dr. Lamonte Sakai of her current status, he came to bedside and after evaluation, we determined ICU admission and continued ventilator support was best at this time. Although current TV's were approx 500, in order to prevent prolonged intubation in the ICU, the anesthesia team agreed to administer small dose of suggamadex despite 4/4 and sustained tetany on twitch monitor. Approx 2 minutes after administration, pt was more alert, sustained head lift & hand grip, TV's increased to 800-1031mL and  O2 saturation improved to > 95% on 50% O2. I decided to continue O2 wean and extubated to facemask. Pt tolerated well and weaned to 3L [place]. Dr. Lamonte Sakai notified personally and patient was discharged to floor.        Last Vitals:  Vitals:   01/09/17 1100 01/09/17 1222  BP:  (!) 105/43  Pulse: (!) 121 (!) 104  Resp:  16  Temp:      Last Pain:  Vitals:   01/09/17 0322  TempSrc: Oral  PainSc:                  Effie Berkshire
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
## 2017-01-09 NOTE — Op Note (Signed)
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  Estimated Blood Loss: 70VX  Complications: none apparent  Indications and History: Alejandra Hernandez is a 61 y.o. female with hx tobacco use, found to have  A RUL mass on imaging. recommendation was made based on Ct chest from 01/02/17 to pursue tissue dx via bronchoscopy and EBUS.  The risks, benefits, complications, treatment options and expected outcomes were discussed with the patient.  The possibilities of pneumothorax, pneumonia, reaction to medication, pulmonary aspiration, perforation of a viscus, bleeding, failure to diagnose a condition and creating a complication requiring transfusion or operation were discussed with the patient who freely signed the consent.    Description of Procedure: The patient was examined in the her hospital room and history and data from the preprocedure consultation were reviewed. It was deemed appropriate to proceed.  The patient was taken to OR10, identified as Alejandra Hernandez and the procedure verified as Flexible Video Fiberoptic Bronchoscopy.  A Time Out was held and the above information confirmed. After being taken to the operating room general anesthesia was initiated and the patient was orally intubated. The video fiberoptic bronchoscope was introduced via the endotracheal tube and a general inspection was performed which showed Normal airways throughout. There may have been some narrowing of the anterior segment of the RUL, but there was no overt endobronchial lesion. Endobronchial brushings were performed in the anterior segment of the RUL for cytology. The standard scope was then withdrawn and the endobronchial  ultrasound was used to identify and characterize the peritracheal, hilar and bronchial lymph nodes. Inspection showed an apparent mass anterior to the anterior segment of the RUL. The lesion was heterogeneous on ultrasound and was adjacent to the heart. Using real-time ultrasound guidance Wang needle biopsies were take from the RUL mass and were sent for cytology. Finally the standard scope was reintroduced and a RUL anterior segmental BAL was performed for cytology. The patient tolerated the procedure well without apparent complications. There was no significant blood loss. The bronchoscope was withdrawn. Anesthesia was reversed and the patient was taken to the PACU for recovery.   Samples: 1. Wang needle biopsies from RUL mass 2. Endobronchial brushings from anterior segment of RUL 3. BAL from anterior segment of the RUL  Plans:  The patient will be transferred back to her hospital room when recovered from anesthesia. We will review the cytology, pathology results with the patient when they become available.    Baltazar Apo, MD, PhD 01/09/2017, 9:04 AM [facility] [phone] or if no answer [phone]

## 2017-01-09 NOTE — Progress Notes (Signed)
PULMONARY / CRITICAL CARE MEDICINE   Name: Alejandra Hernandez MRN: [id number] DOB: 1956/10/21    ADMISSION DATE:  12/28/2016 CONSULTATION DATE:  01/09/17  CHIEF COMPLAINT:  Vent Support Post Procedure   HISTORY OF PRESENT ILLNESS:   61 year old female with PMH of 2DM, CAD, HTN, Gout, PAD, and occult GI bleed presents to ED on 2/4 with flu like symptoms found to be in DKA. CXR revealed a RUL opacity concerning for bronchogenic carcinoma. IR declined needle biopsy, sent for bronchoscopy with EBUS on 2/16 with Dr. Lamonte Sakai.  Post-procedure patient required vent support for increased lethargy. PCCM will transfer to ICU for vent management.   PAST MEDICAL HISTORY :  She  has a past medical history of AKI (acute kidney injury) ([place]) (01/2016); Anemia; Anxiety; Arthritis; Bleeding stomach ulcer (01/2016); Carotid artery disease ([place]); Chronic back pain; Chronic lower back pain; Diabetic peripheral neuropathy ([place]); GERD (gastroesophageal reflux disease); Gout; Headache; Heart murmur; History of blood transfusion; History of echocardiogram; History of nuclear stress test; Hyperlipidemia; Hypertension; PAD (peripheral artery disease) ([place]); and Type II diabetes mellitus ([place]).  PAST SURGICAL HISTORY: She  has a past surgical history that includes Lymph gland excision (Right); Cesarean section (1989); Knee arthroscopy; Esophagogastroduodenoscopy (Left, 02/12/2016); Cardiac catheterization (N/A, 07/23/2016); Back surgery; Anterior cervical decomp/discectomy fusion (04/2011); Exam under anesthesia with manipulation of shoulder (Left, 03/2003); Tubal ligation (1989); Cardiac catheterization (07/30/2016); and Cardiac catheterization (Bilateral, 07/30/2016).  No Known Allergies  No current facility-administered medications on file prior to encounter.    Current Outpatient Prescriptions on File Prior to Encounter  Medication Sig  . allopurinol (ZYLOPRIM) 100 MG tablet Take 100 mg by mouth 2 (two) times daily.   Marland Kitchen  ALPRAZolam (XANAX) 0.5 MG tablet Take 0.25 mg by mouth 2 (two) times daily.   Marland Kitchen amitriptyline (ELAVIL) 25 MG tablet Take 50 mg by mouth at bedtime.  Marland Kitchen amLODipine (NORVASC) 10 MG tablet Take 10 mg by mouth daily with supper.   Marland Kitchen aspirin 81 MG tablet Take 81 mg by mouth daily.  . cloNIDine (CATAPRES) 0.2 MG tablet Take 0.2 mg by mouth 3 (three) times daily.   . ferrous sulfate 325 (65 FE) MG tablet Take 325 mg by mouth 2 (two) times daily with a meal.  . Insulin Glargine (LANTUS SOLOSTAR) 100 UNIT/ML Solostar Pen Inject 30-40 Units into the skin 2 (two) times daily. Per sliding scale  . isosorbide mononitrate (IMDUR) 30 MG 24 hr tablet Take 0.5 tablets (15 mg total) by mouth daily.  Marland Kitchen lisinopril (PRINIVIL,ZESTRIL) 20 MG tablet Take 20 mg by mouth daily.  . nitroGLYCERIN (NITROSTAT) 0.4 MG SL tablet Place 1 tablet (0.4 mg total) under the tongue every 5 (five) minutes as needed for chest pain.  Marland Kitchen oxyCODONE-acetaminophen (PERCOCET) 10-325 MG tablet Take 1 tablet by mouth every 6 (six) hours as needed for pain.   . pantoprazole (PROTONIX) 40 MG tablet TAKE 1 TABLET BY MOUTH TWICE DAILY BEFORE A MEAL (Patient taking differently: Take 40 mg by mouth at night)  . rosuvastatin (CRESTOR) 40 MG tablet Take 1 tablet (40 mg total) by mouth daily.  Marland Kitchen spironolactone (ALDACTONE) 25 MG tablet Take 0.5 tablets (12.5 mg total) by mouth daily.  . ciprofloxacin (CIPRO) 500 MG tablet Take 1 tablet (500 mg total) by mouth 2 (two) times daily. (Patient not taking: Reported on 12/28/2016)  . glucose blood test strip Use as instructed (Patient not taking: Reported on 12/28/2016)    FAMILY HISTORY:  Her indicated that her mother is  deceased. She indicated that her father is deceased. She indicated that both of her sisters are alive. She indicated that her brother is alive. She indicated that her maternal grandmother is deceased. She indicated that her maternal grandfather is deceased. She indicated that her paternal grandmother  is deceased. She indicated that her paternal grandfather is deceased.    SOCIAL HISTORY: She  reports that she quit smoking about 8 months ago. Her smoking use included Cigarettes. She has a 20.00 pack-year smoking history. She quit smokeless tobacco use about 44 years ago. Her smokeless tobacco use included Snuff. She reports that she does not drink alcohol or use drugs.  REVIEW OF SYSTEMS:   Unable to review patient intubated   SUBJECTIVE:  Currently off precedex gtt, tolerating CPAP 5/5.   VITAL SIGNS: BP (!) 167/62 (BP Location: Left Arm)   Pulse (!) 121   Temp 98.1 F (36.7 C)   Resp 16   Ht 5\' 3"  (1.6 m)   Wt 69.5 kg (153 lb 3.5 oz)   SpO2 90%   BMI 27.14 kg/m   HEMODYNAMICS:    VENTILATOR SETTINGS: Vent Mode: PSV;CPAP FiO2 (%):  [50 %] 50 % Set Rate:  [4 bmp-16 bmp] 4 bmp Vt Set:  [500 mL] 500 mL PEEP:  [5 cmH20] 5 cmH20 Pressure Support:  [10 cmH20] 10 cmH20 Plateau Pressure:  [18 cmH20] 18 cmH20  INTAKE / OUTPUT: I/O last 3 completed shifts: In: 720 [P.O.:720] Out: 1000 [Urine:1000]  PHYSICAL EXAMINATION: General: Adult female, no distress Neuro:  Lethargic, slow to respond, does not pick head off bed, moves extremities  HEENT:  ETT in place  Cardiovascular:  RRR, no MRG, NI S1/S2 Lungs: coarse breath sounds on right, clear on left, non-labored  Abdomen:  Non-tender, non-distended, active bowel sounds  Musculoskeletal:  No acute  Skin:  Warm, dry, intact   LABS:  BMET  Recent Labs Lab 01/07/17 0431 01/08/17 0321 01/09/17 0218  NA 134* 134* 138  K 4.6 4.3 4.2  CL 107 108 109  CO2 15* 17* 19*  BUN 13 16 14   CREATININE 2.32* 2.46* 2.46*  GLUCOSE 283* 294* 127*    Electrolytes  Recent Labs Lab 01/07/17 0431 01/08/17 0321 01/09/17 0218  CALCIUM 7.4* 7.8* 8.2*    CBC  Recent Labs Lab 01/07/17 0431 01/08/17 0321 01/09/17 0218  WBC 12.9* 14.3* 13.5*  HGB 9.3* 9.6* 9.8*  HCT 29.7* 30.3* 30.9*  PLT 314 329 402*     Coag's  Recent Labs Lab 01/04/17 0905 01/06/17 1313  INR 1.18 1.25    Sepsis Markers No results for input(s): LATICACIDVEN, PROCALCITON, O2SATVEN in the last 168 hours.  ABG No results for input(s): PHART, PCO2ART, PO2ART in the last 168 hours.  Liver Enzymes No results for input(s): AST, ALT, ALKPHOS, BILITOT, ALBUMIN in the last 168 hours.  Cardiac Enzymes No results for input(s): TROPONINI, PROBNP in the last 168 hours.  Glucose  Recent Labs Lab 01/08/17 0637 01/08/17 1151 01/08/17 1636 01/08/17 2202 01/09/17 0615 01/09/17 0941  GLUCAP 231* 303* 203* 125* 129* 161*    Imaging Dg Chest Port 1 View  Result Date: 01/09/2017 CLINICAL DATA:  Acute respiratory failure. Status post endobronchial ultrasound. EXAM: PORTABLE CHEST 1 VIEW COMPARISON:  12/28/2016 FINDINGS: Endotracheal tube tip is just below the clavicular heads. There is hazy and interstitial type opacity around the right upper lobe mass, increased from chest CT and radiography. Small pleural effusions, also seen on recent chest CT. Streaky opacity at the retrocardiac  lung. No pneumothorax. No cardiomegaly. Metallic debris over the left scapula. IMPRESSION: 1. Increased opacity around the right upper lobe mass could be from retained fluid or post biopsy hemorrhage. 2. Low volumes with atelectasis. 3. Unremarkable endotracheal tube positioning. Electronically Signed   By: Monte Fantasia M.D.   On: 01/09/2017 10:25    CULTURES:   ANTIBIOTICS:   SIGNIFICANT EVENTS  2/04  Admit  2/09  CT chest with RUL mass 2/10  PCCM consulted for RUL mass   STUDIES:  CT Chest 2/9 > 7.5 cm anterior RUL mass concerning for malignancy, associated mildly enlarged mediastinal LAN, small to moderate R pleural effusion, centrilobular emphysema, small left pleural effusion, CAD  LINES/TUBES: ETT 2/16 >>   ASSESSMENT / PLAN:  PULMONARY A: Acute Hypoxic Respiratory Failure  Lung Mass RUL Pleural Effusion  P:    Follow pathology from [place] - plan to extubate soon  Maintain Saturation >88  CARDIOVASCULAR A:  H/O CAD, HTN, PAD, HLD P:  Cardiac Monitoring   RENAL A:   CKD P:   Trend BMP Replace electrolytes as needed  Avoid Nephrotoxic drugs   GASTROINTESTINAL A:   GERD P:   NPO PPI   HEMATOLOGIC A:   Anemia  P:  Trend CBC   INFECTIOUS A:   Leukocytosis  P:   Trend WBC and fever curve   ENDOCRINE A:   2DM  P:   SSI  Q4H Glucose Checks   NEUROLOGIC A:   Encephalopathy secondary to sedation  P:   Avoid sedative medications  Monitor RASS goal: 0    FAMILY  - Updates: no family at bedside  - Inter-disciplinary family meet or Palliative Care meeting due by:  2/23    Hayden Pedro, AG-ACNP [facility]  Pgr: [phone]  PCCM Pgr: [phone]  Note: Patient extubated after one hour. Will transfer back to floor under Dr. Ardelia Mems

## 2017-01-09 NOTE — Procedures (Signed)
Extubation Procedure Note  Patient Details:   Name: Alejandra Hernandez DOB: 04-07-1956 MRN: [id number]   Airway Documentation:    Dr Smith Robert and CRNA at bedside, reversal med given.  Pt more awake, able to cough strong w/ VT 11108ml, pt now able to lift head off pillow for 5 seconds, cough stronger, pt able to grip hands and lift arms off bed now.  Pt nods "yes" when asked if her breathing is better.  RSBI 60-65.    Per Dr Smith Robert, proceed w/ extubation and states he will follow up w/ Dr Lamonte Sakai.   Evaluation  O2 sats: stable throughout Complications: No apparent complications Patient did tolerate procedure well. Bilateral Breath Sounds: Other (Comment) (coarse)   Yes, pt able to speak, no stridor noted.  Sat 95-98% on 10 LPM simple mask.    Lenna Sciara 01/09/2017, 11:48 AM

## 2017-01-09 NOTE — Interval H&P Note (Signed)
PCCM Interval Note  Pt reports no new issues overnight. Her last food was before midnight.  No cough, sputum, CP.  We discussed the procedure in detail, all questions answered.   Vitals:   01/08/17 1011 01/08/17 1404 01/08/17 2122 01/09/17 0322  BP: (!) 135/56 105/66 (!) 128/58 118/66  Pulse:  85 74 71  Resp:  19 16 16   Temp:  98.2 F (36.8 C) 97.7 F (36.5 C) 98.5 F (36.9 C)  TempSrc:  Oral Oral Oral  SpO2:  97% 95% 93%  Weight:      Height:       Gen: Pleasant, overwt woman, in no distress,  normal affect  ENT: No lesions,  mouth clear,  oropharynx clear, no postnasal drip  Neck: No JVD, no stridor  Lungs: No use of accessory muscles, coarse BS on R, L clear  Cardiovascular: RRR, heart sounds normal, no murmur or gallops, no peripheral edema  Musculoskeletal: No deformities, no cyanosis or clubbing  Neuro: alert, non focal  Skin: Warm, no lesions or rashes   Plan: Plan for FOB with EBUS under general anesthesia.  No barriers to proceeding  Baltazar Apo, MD, PhD 01/09/2017, 6:50 AM [facility] [phone] or if no answer [phone]

## 2017-01-09 NOTE — Progress Notes (Signed)
PT Cancellation Note  Patient Details Name: Alejandra Hernandez MRN: [id number] DOB: 05-23-1956   Cancelled Treatment:    Reason Eval/Treat Not Completed: Patient at procedure or test/unavailable. Pt off floor at the OR. PT to return as able as appropriate.   Jhett Fretwell M Jatniel Verastegui 01/09/2017, 9:03 AM   Kittie Plater, PT, DPT Pager #: [phone] Office #: [phone]

## 2017-01-10 ENCOUNTER — Encounter (HOSPITAL_COMMUNITY): Payer: Self-pay | Admitting: Emergency Medicine

## 2017-01-10 LAB — GLUCOSE, CAPILLARY
GLUCOSE-CAPILLARY: 202 mg/dL — AB (ref 65–99)
GLUCOSE-CAPILLARY: 255 mg/dL — AB (ref 65–99)
Glucose-Capillary: 177 mg/dL — ABNORMAL HIGH (ref 65–99)
Glucose-Capillary: 289 mg/dL — ABNORMAL HIGH (ref 65–99)

## 2017-01-10 LAB — BASIC METABOLIC PANEL
Anion gap: 13 (ref 5–15)
BUN: 14 mg/dL (ref 6–20)
CHLORIDE: 110 mmol/L (ref 101–111)
CO2: 15 mmol/L — ABNORMAL LOW (ref 22–32)
Calcium: 8.2 mg/dL — ABNORMAL LOW (ref 8.9–10.3)
Creatinine, Ser: 2.38 mg/dL — ABNORMAL HIGH (ref 0.44–1.00)
GFR calc Af Amer: 24 mL/min — ABNORMAL LOW (ref >60)
GFR, EST NON AFRICAN AMERICAN: 21 mL/min — AB (ref >60)
GLUCOSE: 134 mg/dL — AB (ref 65–99)
POTASSIUM: 4.4 mmol/L (ref 3.5–5.1)
Sodium: 138 mmol/L (ref 135–145)

## 2017-01-10 LAB — CBC
HEMATOCRIT: 32.2 % — AB (ref 36.0–46.0)
Hemoglobin: 10 g/dL — ABNORMAL LOW (ref 12.0–15.0)
MCH: 23 pg — AB (ref 26.0–34.0)
MCHC: 31.1 g/dL (ref 30.0–36.0)
MCV: 74.2 fL — AB (ref 78.0–100.0)
Platelets: 432 10*3/uL — ABNORMAL HIGH (ref 150–400)
RBC: 4.34 MIL/uL (ref 3.87–5.11)
RDW: 17.7 % — AB (ref 11.5–15.5)
WBC: 15.4 10*3/uL — ABNORMAL HIGH (ref 4.0–10.5)

## 2017-01-10 MED ORDER — INSULIN ASPART 100 UNIT/ML ~~LOC~~ SOLN
0.0000 [IU] | Freq: Three times a day (TID) | SUBCUTANEOUS | Status: DC
  Administered 2017-01-11: 2 [IU] via SUBCUTANEOUS

## 2017-01-10 MED ORDER — INSULIN ASPART 100 UNIT/ML ~~LOC~~ SOLN
0.0000 [IU] | Freq: Every day | SUBCUTANEOUS | Status: DC
  Administered 2017-01-10: 3 [IU] via SUBCUTANEOUS

## 2017-01-10 MED ORDER — INSULIN ASPART 100 UNIT/ML ~~LOC~~ SOLN
2.0000 [IU] | Freq: Three times a day (TID) | SUBCUTANEOUS | Status: DC
  Administered 2017-01-10: 4 [IU] via SUBCUTANEOUS
  Administered 2017-01-10: 6 [IU] via SUBCUTANEOUS

## 2017-01-10 MED ORDER — GABAPENTIN 600 MG PO TABS: 300.0000 mg | ORAL_TABLET | Freq: Three times a day (TID) | ORAL | 0 refills | Status: DC

## 2017-01-10 MED ORDER — INSULIN GLARGINE 100 UNIT/ML SOLOSTAR PEN: 18.0000 [IU] | PEN_INJECTOR | Freq: Two times a day (BID) | SUBCUTANEOUS | Status: DC

## 2017-01-10 NOTE — Progress Notes (Signed)
Pt states "my foot's swollen". Pt sitting on side of bed. Educated pt that she should elevate foot above heart to reduce swelling. Pt not interested in compliance. Bruise on heel of foot as well. BS: 255. Notified MD and educated pt regarding BS as pt opened a [employer] up to eat. Educated pt that eating chocolate is not good for her blood sugar. She stated "I have to eat sugar everyday". MD aware of pt non compliance. Pt covered with SS insulin and will continue to monitor. Cato Mulligan RN
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
## 2017-01-11 DIAGNOSIS — R600 Localized edema: Secondary | ICD-10-CM

## 2017-01-11 LAB — GLUCOSE, CAPILLARY: Glucose-Capillary: 161 mg/dL — ABNORMAL HIGH (ref 65–99)

## 2017-01-11 NOTE — Progress Notes (Signed)
Family Medicine Teaching Service Daily Progress Note Intern Pager: [phone]  Patient name: Alejandra Hernandez Medical record number: [id number] Date of birth: Dec 22, 1955 Age: 61 y.o. Gender: female  Primary Care Provider: Antonietta Jewel, MD Consultants: None Code Status: Full  Assessment and Plan: 61 y.o. female presenting with flu like symptoms, found to be in DKA and also to have RUL opacity of uncertain etiology on CXR. PMH is significant for IDT2DM, CAD, HTN, Gout, PAD, occult GI bleed  #Lung opacity of uncertain etiology, stable - Lung CT mass-like opacity in ant RUL 7.5 x 6.4 cm, extensive airspace.  S/p biopsy of mass 01/09/17. S/p CAP treatment with Azithro and Rocephin (2/4-2/8).  - Follow up biopsy results at OP f/u  #AKI, improving - Creatinine trending down 3.1>2.46>2.38. Baseline ~1.1.  - Avoid nephrotoxic meds - Follow up BMP at next outpatient visit  #Insulin-dependent T2DM presenting in DKA, improved - DKA now resolved. Holding home regimen (30-40u lantus QHS and Humalog SS). CBGs 161-289 in last 24h with 15 units SSI given - Lantus 18u BID.  - Standard Insulin Sliding Scale  #HTN, controlled - Patient normotensive. Home regimen includes spironolactone 12.5 mg Qd, amlodipine 10 mg qd, lisinopril 20 mg daily, Imdur 30 mg Daily, clonidine  - Hold spironolactone, lisinopril for AKI - Continue home amlodipine 10 mg qd, home imdur qd, clonidine 0.2 mg TID  #PAD, severe, stable. Home regimen is only ASA 81, Plavix discontinued in 10/2016 due to GI bleeding. - Hold home ASA in setting of hemoptysis and anemia  FEN/GI: Heart Healthy/Carb Modified, SLIV Prophylaxis: SCDs  Dispo: Home later today  Subjective:  Feels much better today. Anxious to go home. Reports intermittent swelling of feet  Objective: Temp:  [98.2 F (36.8 C)-98.6 F (37 C)] 98.2 F (36.8 C) (02/18 0537) Pulse Rate:  [85-97] 97 (02/18 0537) Resp:  [19] 19 (02/17 1420) BP: (117-136)/(49-66) 132/66  (02/18 0537) SpO2:  [95 %-99 %] 97 % (02/18 0537) Physical Exam General: sitting comfortably on side of bed, NAD Cardiovascular: RRR, no m/r/g Respiratory: clear to auscultation bilaterally Abdomen: soft, nontender, nondistended Extremities: 1+ edema of b/l feet, 2+ DP pulse of L foot, difficult to palpate pulse in R foot  Laboratory: Pertinent labs this admission UA: Hazy, >500 glucose, small Hgb, 5 ketones, 100 protein, rare bacteria, 0-5 squams Ferritin 517, B12 876, Iron low at 8, TIBC low at 141  FeNa 3.5% Troponin 0.22 >0.08  last lipid panel 04/20/2016 Chol 265, HDL 85, LDL 157, triglycerides 113   Recent Labs Lab 01/08/17 0321 01/09/17 0218 01/10/17 0333  WBC 14.3* 13.5* 15.4*  HGB 9.6* 9.8* 10.0*  HCT 30.3* 30.9* 32.2*  PLT 329 402* 432*    Recent Labs Lab 01/08/17 0321 01/09/17 0218 01/10/17 0333  NA 134* 138 138  K 4.3 4.2 4.4  CL 108 109 110  CO2 17* 19* 15*  BUN 16 14 14   CREATININE 2.46* 2.46* 2.38*  CALCIUM 7.8* 8.2* 8.2*  GLUCOSE 294* 127* 134*    Imaging/Diagnostic Tests: No results found.  Virginia Crews, MD 01/11/2017, 6:48 AM PGY-3, [place] Intern pager: [phone], text pages welcome

## 2017-01-11 NOTE — Discharge Instructions (Signed)
You were admitted for DKA (related to diabetes).  It is important to take insulin as prescribed.  You also had pneumonia that has been treated.  You had blood in your stool and will see GI in follow-up.  You had a lung mass biopsied and will follow-up with results later.

## 2017-01-11 NOTE — Care Management Note (Signed)
Case Management Note Previous CM note initiated by Zenon Mayo, RN-12/29/2016, 3:00 PM    Patient Details  Name: Alejandra Hernandez MRN: [id number] Date of Birth: [birth year]  Subjective/Objective:   Presents with DKA, CAP, elevated lactic acid, aki, elevated trops-demand ischemia, htn, anemia, pad, and chronic back pain,  Await pt eval. NCM will cont to follow for dc needs.                Action/Plan: 01/02/17- pt tx from 4E to 2W- PTA pt from home-   Expected Discharge Date:    01/11/17              Expected Discharge Plan:  [place]  In-House Referral:     Discharge planning Services  CM Consult  Post Acute Care Choice:  [place], Durable Medical Equipment Choice offered to:  Patient  DME Arranged:  N/A DME Agency:  NA  HH Arranged:  RN, PT, Nurse's Aide [place] Agency:  [place]  Status of Service:  Completed, signed off  If discussed at [place] of Stay Meetings, dates discussed:  2/13, 2/15  Additional Comments:  01/11/17- 1040- Naileah Karg RN, CM- pt for d/c later today, orders placed for HHRN/PT/aide- spoke with pt at bedside choice offered for [facility] services- pt states she does not have a preference for agency- as long as they work with insurance- call made to [employer] with [employer]- they can accept referral with [facility] Medicare- referral given to [facility]- pt agreeable to this- pt on RA now and will not need home 02, states she has a cane and does not want RW. Pt interested in PCS with Medicaid- paperwork given to pt to f/u with.   01/08/17- Marvetta Gibbons RN, - Per PT eval recommendation for HHPT- will need orders prior to discharge. plan for bronch on 2/15 for lung bx- CM to continue to follow for d/c needs   Dawayne Patricia, RN 01/11/2017, 10:39 AM [phone]

## 2017-01-14 ENCOUNTER — Telehealth: Payer: Self-pay | Admitting: Emergency Medicine

## 2017-01-14 ENCOUNTER — Telehealth: Payer: Self-pay | Admitting: Family Medicine

## 2017-01-14 ENCOUNTER — Ambulatory Visit (INDEPENDENT_AMBULATORY_CARE_PROVIDER_SITE_OTHER): Payer: Medicare Other | Admitting: Family Medicine

## 2017-01-14 ENCOUNTER — Encounter: Payer: Self-pay | Admitting: Family Medicine

## 2017-01-14 VITALS — BP 118/80 | HR 88 | Ht 63.0 in | Wt 155.0 lb

## 2017-01-14 DIAGNOSIS — E1165 Type 2 diabetes mellitus with hyperglycemia: Secondary | ICD-10-CM

## 2017-01-14 DIAGNOSIS — Z794 Long term (current) use of insulin: Secondary | ICD-10-CM

## 2017-01-14 DIAGNOSIS — R918 Other nonspecific abnormal finding of lung field: Secondary | ICD-10-CM | POA: Diagnosis not present

## 2017-01-14 DIAGNOSIS — IMO0002 Reserved for concepts with insufficient information to code with codable children: Secondary | ICD-10-CM

## 2017-01-14 DIAGNOSIS — R600 Localized edema: Secondary | ICD-10-CM

## 2017-01-14 DIAGNOSIS — N179 Acute kidney failure, unspecified: Secondary | ICD-10-CM | POA: Diagnosis not present

## 2017-01-14 DIAGNOSIS — R195 Other fecal abnormalities: Secondary | ICD-10-CM

## 2017-01-14 DIAGNOSIS — E1151 Type 2 diabetes mellitus with diabetic peripheral angiopathy without gangrene: Secondary | ICD-10-CM

## 2017-01-14 LAB — COMPREHENSIVE METABOLIC PANEL
ALT: 77 U/L — ABNORMAL HIGH (ref 14–54)
ANION GAP: 9 (ref 5–15)
AST: 57 U/L — ABNORMAL HIGH (ref 15–41)
Albumin: 2.7 g/dL — ABNORMAL LOW (ref 3.5–5.0)
Alkaline Phosphatase: 110 U/L (ref 38–126)
BILIRUBIN TOTAL: 0.4 mg/dL (ref 0.3–1.2)
BUN: 12 mg/dL (ref 6–20)
CALCIUM: 8.5 mg/dL — AB (ref 8.9–10.3)
CO2: 22 mmol/L (ref 22–32)
Chloride: 105 mmol/L (ref 101–111)
Creatinine, Ser: 1.99 mg/dL — ABNORMAL HIGH (ref 0.44–1.00)
GFR calc non Af Amer: 26 mL/min — ABNORMAL LOW (ref >60)
GFR, EST AFRICAN AMERICAN: 30 mL/min — AB (ref >60)
Glucose, Bld: 180 mg/dL — ABNORMAL HIGH (ref 65–99)
Potassium: 4.3 mmol/L (ref 3.5–5.1)
Sodium: 136 mmol/L (ref 135–145)
TOTAL PROTEIN: 6.3 g/dL — AB (ref 6.5–8.1)

## 2017-01-14 LAB — CBC
HEMATOCRIT: 30.5 % — AB (ref 36.0–46.0)
HEMOGLOBIN: 9.5 g/dL — AB (ref 12.0–15.0)
MCH: 23.7 pg — AB (ref 26.0–34.0)
MCHC: 31.1 g/dL (ref 30.0–36.0)
MCV: 76.1 fL — ABNORMAL LOW (ref 78.0–100.0)
Platelets: 560 10*3/uL — ABNORMAL HIGH (ref 150–400)
RBC: 4.01 MIL/uL (ref 3.87–5.11)
RDW: 17.8 % — ABNORMAL HIGH (ref 11.5–15.5)
WBC: 15.9 10*3/uL — ABNORMAL HIGH (ref 4.0–10.5)

## 2017-01-14 NOTE — Telephone Encounter (Signed)
Spoke with pt. She is aware of results. Pt has been scheduled with RB on 01/21/17 at 1:30pm. Nothing further was needed.

## 2017-01-14 NOTE — Patient Instructions (Signed)
It was a pleasure to see you today! Thank you for choosing [facility] for your primary care. Alejandra Hernandez was seen for hospital follow up. We will call you if lab results are not normal. You also need a pulmonology.   Best,  Dr. Lindell Noe

## 2017-01-14 NOTE — Assessment & Plan Note (Signed)
Ordered repeat CMP today, also to evaluate albumin as noted in additional problem. Patient reports she's been urinating normally.

## 2017-01-14 NOTE — Assessment & Plan Note (Signed)
Has been using using Lantus 20 units twice a day and 3-5 units of Humalog at lunch, sugars seem appropriate at this time. We'll continue this plan. Counseled on what to do a flow.

## 2017-01-14 NOTE — Telephone Encounter (Signed)
Please let the patient know that her bronchoscopy results show no evidence for cancer cells in the samples we collected. Even so, I am still suspicious about the area in her right lung and I would like for her to set up a visit with me to discuss next steps to evaluate it. Possibly a PET scan, certainly repeat Ct scan in coming months. Thanks.

## 2017-01-14 NOTE — Assessment & Plan Note (Signed)
Reports this is new immediately prior to her hospitalization, etiology ultrasound bilaterally without clot during hospitalization. Given possible malignancy with right upper lobe mass, will repeat DVT ultrasound ASAP. Also ordered CBC to assess anemia. CMP to assess albumin.

## 2017-01-14 NOTE — Progress Notes (Signed)
   CC: hospital follow up  HPI Notably, PCP and Epic is listed as Dr. Young Berry, who is in [place]. Patient reports that she went to his office this morning, and he continues to be her PCP including adjusting her diabetes medicines.  CBGs - 130, 234. Has been taking Lantus 20U in morning, lunch 3-5 humalog, 20U lantus at night. No symptoms with "low" at 134.   Wants PAD surgery, feels not strong enough to do this. No pulse on R is chronic. Hx of PAD surgery on L.   Hurts when she starts voiding. Goes 3x per day, then frequently at night.   Mostly concerned with bilateral lower extremity swelling. Reports that she has never had this before, and this happened during the hospitalization. DVT studies done in the hospital were normal. Patient is somewhat active at home walking to the halls in her house.  CC, SH/smoking status, and VS noted  Objective: BP 118/80 (BP Location: Right Arm, Patient Position: Sitting)   Pulse 88   Ht 5\' 3"  (1.6 m)   Wt 155 lb (70.3 kg)   SpO2 99%   BMI 27.46 kg/m  Gen: NAD, alert, cooperative, and pleasant. HEENT: NCAT, EOMI, PERRL CV: RRR, no murmur Resp: CTAB, no wheezes, non-labored Abd: SNTND, BS present, no guarding or organomegaly Ext: 3+ pitting edema bilaterally, warm. No palpable pulses in either extremity.  Neuro: Alert and oriented, Speech clear, No gross deficits  Assessment and plan:  Uncontrolled type 2 diabetes mellitus with diabetic peripheral angiopathy without gangrene, with long-term current use of insulin (HCC) Has been using using Lantus 20 units twice a day and 3-5 units of Humalog at lunch, sugars seem appropriate at this time. We'll continue this plan. Counseled on what to do a flow.  AKI (acute kidney injury) ([place]) Ordered repeat CMP today, also to evaluate albumin as noted in additional problem. Patient reports she's been urinating normally.  Occult GI bleeding Patient requests that hemoglobin be checked, was planning on  doing this anyway for her bilateral extremity edema. Denies any bleeding or symptoms.  Leg edema Reports this is new immediately prior to her hospitalization, etiology ultrasound bilaterally without clot during hospitalization. Given possible malignancy with right upper lobe mass, will repeat DVT ultrasound ASAP. Also ordered CBC to assess anemia. CMP to assess albumin.  Mass of upper lobe of right lung Reviewed cytology results with patient, results indicated benign findings. However, given mass malignancy cannot completely be ruled out. Given that the patient does not want to use [place] as her primary care office, will instruct her to have her PCP refer her to pulmonology for future follow-up.   Orders Placed This Encounter  Procedures  . CBC  . Comprehensive metabolic panel  . Ambulatory referral to Pulmonology    Referral Priority:   Routine    Referral Type:   Consultation    Referral Reason:   Specialty Services Required    Requested Specialty:   Pulmonary Disease    Number of Visits Requested:   1    Ralene Ok, MD, PGY1 01/14/2017 4:33 PM

## 2017-01-14 NOTE — Telephone Encounter (Signed)
Reviewed lab results with patient. Instructed her that it would be important to get her DVT ultrasound tomorrow, as she reported to staff that she would have trouble with transportation. Also instructed her to talk to Dr. Sheryle Hail who will be her PCP and have him refer her to pulmonology. Instructed her that this would be important in case bronchoscopy sample was not adequate or further imaging was required.

## 2017-01-14 NOTE — Assessment & Plan Note (Addendum)
Patient requests that hemoglobin be checked, was planning on doing this anyway for her bilateral extremity edema. Denies any bleeding or symptoms.

## 2017-01-14 NOTE — Telephone Encounter (Signed)
lmtcb x1 for pt. 

## 2017-01-14 NOTE — Assessment & Plan Note (Addendum)
Reviewed cytology results with patient, results indicated benign findings. However, given mass malignancy cannot completely be ruled out. Given that the patient does not want to use [place] as her primary care office, will instruct her to have her PCP refer her to pulmonology for future follow-up.

## 2017-01-15 ENCOUNTER — Telehealth: Payer: Self-pay | Admitting: *Deleted

## 2017-01-15 ENCOUNTER — Ambulatory Visit (HOSPITAL_COMMUNITY)
Admission: RE | Admit: 2017-01-15 | Discharge: 2017-01-15 | Disposition: A | Discharge status: Home / Self Care | Payer: Medicare Other | Source: Ambulatory Visit | Attending: Family Medicine | Admitting: Family Medicine

## 2017-01-15 DIAGNOSIS — M7989 Other specified soft tissue disorders: Secondary | ICD-10-CM | POA: Diagnosis present

## 2017-01-15 DIAGNOSIS — R936 Abnormal findings on diagnostic imaging of limbs: Secondary | ICD-10-CM | POA: Insufficient documentation

## 2017-01-15 DIAGNOSIS — R600 Localized edema: Secondary | ICD-10-CM | POA: Diagnosis not present

## 2017-01-15 MED ORDER — RIVAROXABAN (XARELTO) VTE STARTER PACK (15 & 20 MG): ORAL_TABLET | ORAL | 0 refills | Status: DC

## 2017-01-15 NOTE — Telephone Encounter (Signed)
[facility] called to report that patient has Bilateral lower extremity DVTs.  Report given to Dr. Nori Riis.  Patient to start on Xarelto 15 mg 1 tablet BID for 21 days and on day 22 start 20 mg 1 tab daily.  Rx sent to patient's pharmacy.  PA was started at [email or web address].  PA is pending per [employer].  Derl Barrow, RN

## 2017-01-15 NOTE — Progress Notes (Signed)
*  Preliminary Results* Bilateral lower extremity venous duplex completed. Bilateral lower extremities are positive for acute deep vein thrombosis involving bilateral peroneal veins. There is no evidence of Baker's cyst bilaterally.  Preliminary results discussed with Dr. Nori Riis. Patient is instructed to return to the clinic at 1:30pm.  01/15/2017 11:56 AM Maudry Mayhew, BS, RVT, RDCS, RDMS

## 2017-01-15 NOTE — Telephone Encounter (Signed)
PA for Xarelto starter pack was voided.  Medication is on patient's plan's list of covered drugs.  Reference number: [id number].  Derl Barrow, RN

## 2017-01-19 ENCOUNTER — Telehealth: Payer: Self-pay | Admitting: Student in an Organized Health Care Education/Training Program

## 2017-01-19 NOTE — Telephone Encounter (Signed)
Pt would like to know if steroid shots are ok to get. Please advise. ep

## 2017-01-20 NOTE — Telephone Encounter (Signed)
Called patient to get more information about steroid shots she was asking about. She said she was wondering if it was safe to get her usual steroid injections in her leg and back while on the anticoagulation for new DVTs.  She states when she did not hear back from our office yesterday afternoon she went ahead with her appointment to get these injections yesterday.  She states she has another follow up in our office this week.

## 2017-01-21 ENCOUNTER — Ambulatory Visit: Payer: Medicare Other | Admitting: Emergency Medicine

## 2017-01-22 ENCOUNTER — Other Ambulatory Visit: Payer: Self-pay | Admitting: Emergency Medicine

## 2017-01-22 ENCOUNTER — Telehealth: Payer: Self-pay | Admitting: Emergency Medicine

## 2017-01-22 DIAGNOSIS — R918 Other nonspecific abnormal finding of lung field: Secondary | ICD-10-CM

## 2017-01-22 NOTE — Telephone Encounter (Signed)
Attempted to contact pt. No answer and I could not leave a message. Will try back. 

## 2017-01-22 NOTE — Telephone Encounter (Signed)
Spoke with patient and informed her Dr. Lamonte Sakai would like to have PET scan performed prior to next appointment. Pt was reminded she had an appointment on 01/28/17. A STAT order was placed for a PET scan. Nothing further is needed.

## 2017-01-22 NOTE — Telephone Encounter (Signed)
Spoke with pt. Her ROV has been moved to 02/02/17 at 1:30pm to review her PET scan results. Pt is requesting to have something to take prior to her PET scan for anxiety.  RB - please advise. Thanks.

## 2017-01-23 ENCOUNTER — Ambulatory Visit (INDEPENDENT_AMBULATORY_CARE_PROVIDER_SITE_OTHER): Payer: Medicare Other | Admitting: Family Medicine

## 2017-01-23 ENCOUNTER — Encounter: Payer: Self-pay | Admitting: Family Medicine

## 2017-01-23 VITALS — BP 140/78 | HR 78 | Temp 97.6°F | Ht 63.0 in | Wt 153.4 lb

## 2017-01-23 DIAGNOSIS — I82409 Acute embolism and thrombosis of unspecified deep veins of unspecified lower extremity: Secondary | ICD-10-CM | POA: Insufficient documentation

## 2017-01-23 DIAGNOSIS — K921 Melena: Secondary | ICD-10-CM | POA: Diagnosis not present

## 2017-01-23 DIAGNOSIS — I82493 Acute embolism and thrombosis of other specified deep vein of lower extremity, bilateral: Secondary | ICD-10-CM

## 2017-01-23 MED ORDER — RIVAROXABAN 20 MG PO TABS: 20.0000 mg | ORAL_TABLET | Freq: Every day | ORAL | 3 refills | Status: DC

## 2017-01-23 NOTE — Assessment & Plan Note (Addendum)
Recent diagnosis.  No prior DVT/PE.  Venous ultrasound report reviewed.  Working etiology is right pulmonary lung mass.  She is scheduled to see pulm for repeat CT and biopsy soon.  I have ordered Xarelto 20mg  daily to start once she has completed the dose pack.  If she is found to have malignancy, she will likely require long term anticoagulation.  Will leave this discussion to PCP.  Additionally, she is having scant intermittent blood in stool.  No rectal exam performed today, as she denied bleeding in several days.  VSS.  She has h/o gastric ulcer in past. GI appt coming up in next week. Will defer workup to them.  Patient to schedule follow up to be seen by Dr Burr Medico in 3 weeks/ once she has seen pulmonology.

## 2017-01-23 NOTE — Telephone Encounter (Signed)
She can take xanax 0.5mg  x 1

## 2017-01-23 NOTE — Telephone Encounter (Signed)
Attempted to contact pt. No answer, no option to leave a message. Will try back.  

## 2017-01-23 NOTE — Progress Notes (Signed)
    Subjective: CC: DVT follow up HPI: Alejandra Hernandez is a 61 y.o. female presenting to clinic today for:  DVT/ Anticoagulation follow up Patient reports compliance with Xarelto for DVT that was diagnosed via LE u/s on 2/22.  She reports bruising from the hospital stay but no new bruises.  She denies vaginal bleeding, gingival bleeding.  She reports 2 episodes of scant blood after stooling.  Denies constipation, rectal pain with defecation, abdominal pain, nausea, vomiting, palpitations.  She reports that her LLE pain is better but has persistent mild RLE pain.  She notes that she has stents in the LLE and has yet to get stents placed in the RLE.  Social Hx reviewed. MedHx, medications and allergies reviewed.  Please see EMR. ROS: Per HPI  Objective: Office vital signs reviewed. BP 140/78   Pulse 78   Temp 97.6 F (36.4 C) (Oral)   Ht 5\' 3"  (1.6 m)   Wt 153 lb 6.4 oz (69.6 kg)   SpO2 98%   BMI 27.17 kg/m   Physical Examination:  General: Awake, alert, well appearing female, No acute distress HEENT: Normal, no conjunctival pallor Cardio: regular rate and rhythm, S1S2 heard, no murmurs appreciated Pulm: clear to auscultation bilaterally, though breath sounds are slightly decreased in the right upper/ middle lung fields, no wheezes, rhonchi or rales; normal work of breathing on room air GI: soft, NT/ND, +BS Extremities: warm, well perfused, No edema, cyanosis or clubbing; calf girth symmetric, no TTP  Assessment/ Plan: 61 y.o. female   Deep venous thrombosis (HCC) Recent diagnosis.  No prior DVT/PE.  Working etiology is right pulmonary lung mass.  She is scheduled to see pulm for repeat CT and biopsy soon.  I have ordered Xarelto 20mg  daily to start once she has completed the dose pack.  If she is found to have malignancy, she will likely require long term anticoagulation.  Will leave this discussion to PCP.  Additionally, she is having scant intermittent blood in stool.  No rectal  exam performed today, as she denied bleeding in several days.  VSS.  She has h/o gastric ulcer in past. GI appt coming up in next week. Will defer workup to them.  Patient to schedule follow up to be seen by Dr Burr Medico in 3 weeks/ once she has seen pulmonology.  Flecks of blood in stool GI appt as above.  Return precautions reviewed with patient.  Follow up with PCP in 3 weeks or sooner if needed.   Janora Norlander, DO PGY-3, [facility]

## 2017-01-23 NOTE — Patient Instructions (Signed)
I have renewed your Xarelto to start once you have finished the dose pack.  Please make sure to follow up with your GI and Pulmonary doctors as scheduled.  If you notice that the flecks of blood in stool increase, I recommend that you seek immediate medical attention.   Plan to follow up with Dr Burr Medico around the 3rd week in March or sooner if needed.   Deep Vein Thrombosis A deep vein thrombosis (DVT) is a blood clot (thrombus) that usually occurs in a deep, larger vein of the lower leg or the pelvis, or in an upper extremity such as the arm. These are dangerous and can lead to serious and even life-threatening complications if the clot travels to the lungs. A DVT can damage the valves in your leg veins so that instead of flowing upward, the blood pools in the lower leg. This is called post-thrombotic syndrome, and it can result in pain, swelling, discoloration, and sores on the leg. What are the causes? A DVT is caused by the formation of a blood clot in your leg, pelvis, or arm. Usually, several things contribute to the formation of blood clots. A clot may develop when:  Your blood flow slows down.  Your vein becomes damaged in some way.  You have a condition that makes your blood clot more easily. What increases the risk? A DVT is more likely to develop in:  People who are older, especially over 64 years of age.  People who are overweight (obese).  People who sit or lie still for a long time, such as during long-distance travel (over 4 hours), bed rest, hospitalization, or during recovery from certain medical conditions like a stroke.  People who do not engage in much physical activity (sedentary lifestyle).  People who have chronic breathing disorders.  People who have a personal or family history of blood clots or blood clotting disease.  People who have peripheral vascular disease (PVD), diabetes, or some types of cancer.  People who have heart disease, especially if the person  had a recent heart attack or has congestive heart failure.  People who have neurological diseases that affect the legs (leg paresis).  People who have had a traumatic injury, such as breaking a hip or leg.  People who have recently had major or lengthy surgery, especially on the hip, knee, or abdomen.  People who have had a central line placed inside a large vein.  People who take medicines that contain the hormone estrogen. These include birth control pills and hormone replacement therapy.  Pregnancy or during childbirth or the postpartum period.  Long plane flights (over 8 hours). What are the signs or symptoms?   Symptoms of a DVT can include:  Swelling of your leg or arm, especially if one side is much worse.  Warmth and redness of your leg or arm, especially if one side is much worse.  Pain in your arm or leg. If the clot is in your leg, symptoms may be more noticeable or worse when you stand or walk.  A feeling of pins and needles, if the clot is in the arm. The symptoms of a DVT that has traveled to the lungs (pulmonary embolism, PE) usually start suddenly and include:  Shortness of breath while active or at rest.  Coughing or coughing up blood or blood-tinged mucus.  Chest pain that is often worse with deep breaths.  Rapid or irregular heartbeat.  Feeling light-headed or dizzy.  Fainting.  Feeling anxious.  Sweating.  There may also be pain and swelling in a leg if that is where the blood clot started. These symptoms may represent a serious problem that is an emergency. Do not wait to see if the symptoms will go away. Get medical help right away. Call your local emergency services (911 in the [place] Do not drive yourself to the hospital.  How is this diagnosed? Your health care provider will take a medical history and perform a physical exam. You may also have other tests, including:  Blood tests to assess the clotting properties of your blood.  Imaging tests,  such as CT, ultrasound, MRI, X-ray, and other tests to see if you have clots anywhere in your body. How is this treated? After a DVT is identified, it can be treated. The type of treatment that you receive depends on many factors, such as the cause of your DVT, your risk for bleeding or developing more clots, and other medical conditions that you have. Sometimes, a combination of treatments is necessary. Treatment options may be combined and include:  Monitoring the blood clot with ultrasound.  Taking medicines by mouth, such as newer blood thinners (anticoagulants), thrombolytics, or warfarin.  Taking anticoagulant medicine by injection or through an IV tube.  Wearing compression stockings or using different types ofdevices.  Surgery (rare) to remove the blood clot or to place a filter in your abdomen to stop the blood clot from traveling to your lungs. Treatments for a DVT are often divided into immediate treatment and long-term treatment (up to 3 months after DVT). You can work with your health care provider to choose the treatment program that is best for you. Follow these instructions at home: If you are taking a newer oral anticoagulant:   Take the medicine every single day at the same time each day.  Understand what foods and drugs interact with this medicine.  Understand that there are no regular blood tests required when using this medicine.  Understand the side effects of this medicine, including excessive bruising or bleeding. Ask your health care provider or pharmacist about other possible side effects. If you are taking warfarin:   Understand how to take warfarin and know which foods can affect how warfarin works in Veterinary surgeon.  Understand that it is dangerous to take too much or too little warfarin. Too much warfarin increases the risk of bleeding. Too little warfarin continues to allow the risk for blood clots.  Follow your PT and INR blood testing schedule. The PT and  INR results allow your health care provider to adjust your dose of warfarin. It is very important that you have your PT and INR tested as often as told by your health care provider.  Avoid major changes in your diet, or tell your health care provider before you change your diet. Arrange a visit with a registered dietitian to answer your questions. Many foods, especially foods that are high in vitamin K, can interfere with warfarin and affect the PT and INR results. Eat a consistent amount of foods that are high in vitamin K, such as:  Spinach, kale, broccoli, cabbage, collard greens, turnip greens, Brussels sprouts, peas, cauliflower, seaweed, and parsley.  Beef liver and pork liver.  Green tea.  Soybean oil.  Tell your health care provider about any and all medicines, vitamins, and supplements that you take, including aspirin and other over-the-counter anti-inflammatory medicines. Be especially cautious with aspirin and anti-inflammatory medicines. Do not take those before you ask your health care provider  if it is safe to do so. This is important because many medicines can interfere with warfarin and affect the PT and INR results.  Do not start or stop taking any over-the-counter or prescription medicine unless your health care provider or pharmacist tells you to do so. If you take warfarin, you will also need to do these things:  Hold pressure over cuts for longer than usual.  Tell your dentist and other health care providers that you are taking warfarin before you have any procedures in which bleeding may occur.  Avoid alcohol or drink very small amounts. Tell your health care provider if you change your alcohol intake.  Do not use tobacco products, including cigarettes, chewing tobacco, and e-cigarettes. If you need help quitting, ask your health care provider.  Avoid contact sports. General instructions   Take over-the-counter and prescription medicines only as told by your health  care provider. Anticoagulant medicines can have side effects, including easy bruising and difficulty stopping bleeding. If you are prescribed an anticoagulant, you will also need to do these things:  Hold pressure over cuts for longer than usual.  Tell your dentist and other health care providers that you are taking anticoagulants before you have any procedures in which bleeding may occur.  Avoid contact sports.  Wear a medical alert bracelet or carry a medical alert card that says you have had a PE.  Ask your health care provider how soon you can go back to your normal activities. Stay active to prevent new blood clots from forming.  Make sure to exercise while traveling or when you have been sitting or standing for a long period of time. It is very important to exercise. Exercise your legs by walking or by tightening and relaxing your leg muscles often. Take frequent walks.  Wear compression stockings as told by your health care provider to help prevent more blood clots from forming.  Do not use tobacco products, including cigarettes, chewing tobacco, and e-cigarettes. If you need help quitting, ask your health care provider.  Keep all follow-up appointments with your health care provider. This is important. How is this prevented? Take these actions to decrease your risk of developing another DVT:  Exercise regularly. For at least 30 minutes every day, engage in:  Activity that involves moving your arms and legs.  Activity that encourages good blood flow through your body by increasing your heart rate.  Exercise your arms and legs every hour during long-distance travel (over 4 hours). Drink plenty of water and avoid drinking alcohol while traveling.  Avoid sitting or lying in bed for long periods of time without moving your legs.  Maintain a weight that is appropriate for your height. Ask your health care provider what weight is healthy for you.  If you are a woman who is over 16  years of age, avoid unnecessary use of medicines that contain estrogen. These include birth control pills.  Do not smoke, especially if you take estrogen medicines. If you need help quitting, ask your health care provider. If you are hospitalized, prevention measures may include:  Early walking after surgery, as soon as your health care provider says that it is safe.  Receiving anticoagulants to prevent blood clots.If you cannot take anticoagulants, other options may be available, such as wearing compression stockings or using different types of devices. Get help right away if:  You have new or increased pain, swelling, or redness in an arm or leg.  You have numbness or tingling in  an arm or leg.  You have shortness of breath while active or at rest.  You have chest pain.  You have a rapid or irregular heartbeat.  You feel light-headed or dizzy.  You cough up blood.  You notice blood in your vomit, bowel movement, or urine. These symptoms may represent a serious problem that is an emergency. Do not wait to see if the symptoms will go away. Get medical help right away. Call your local emergency services (911 in the [place] Do not drive yourself to the hospital. This information is not intended to replace advice given to you by your health care provider. Make sure you discuss any questions you have with your health care provider. Document Released: 11/10/2005 Document Revised: 04/17/2016 Document Reviewed: 03/07/2015 Elsevier Interactive Patient Education  2017 Else[employer] Inc.   Gastrointestinal Bleeding Gastrointestinal bleeding is bleeding somewhere along the path food travels through the body (digestive tract). This path is anywhere between the mouth and the opening of the butt (anus). You may have blood in your poop (stools) or have black poop. If you throw up (vomit), there may be blood in it. This condition can be mild, serious, or even life-threatening. If you have a lot of  bleeding, you may need to stay in the hospital. Follow these instructions at home:  Take over-the-counter and prescription medicines only as told by your doctor.  Eat foods that have a lot of fiber in them. These foods include whole grains, fruits, and vegetables. You can also try eating 1-3 prunes each day.  Drink enough fluid to keep your pee (urine) clear or pale yellow.  Keep all follow-up visits as told by your doctor. This is important. Contact a doctor if:  Your symptoms do not get better. Get help right away if:  Your bleeding gets worse.  You feel dizzy or you pass out (faint).  You feel weak.  You have very bad cramps in your back or belly (abdomen).  You pass large clumps of blood (clots) in your poop.  Your symptoms are getting worse. This information is not intended to replace advice given to you by your health care provider. Make sure you discuss any questions you have with your health care provider. Document Released: 08/19/2008 Document Revised: 04/17/2016 Document Reviewed: 04/30/2015 Elsevier Interactive Patient Education  2017 [employer].

## 2017-01-26 MED ORDER — ALPRAZOLAM 0.5 MG PO TABS
0.5000 mg | ORAL_TABLET | Freq: Once | ORAL | 0 refills | Status: AC
Start: 2017-01-26 — End: 2017-01-26

## 2017-01-26 NOTE — Telephone Encounter (Signed)
Pt called the answering service  On 01/23/17 at 5:16 pm states calling back [phone]

## 2017-01-26 NOTE — Telephone Encounter (Signed)
Spoke with pt. She is aware that I will be calling Xanax. Rx has been called in. Nothing further was needed.

## 2017-01-28 ENCOUNTER — Ambulatory Visit: Payer: Medicare Other | Admitting: Emergency Medicine

## 2017-02-02 ENCOUNTER — Ambulatory Visit: Payer: Medicare Other | Admitting: Gastroenterology

## 2017-02-02 ENCOUNTER — Encounter (HOSPITAL_COMMUNITY)
Admission: RE | Admit: 2017-02-02 | Discharge: 2017-02-02 | Disposition: A | Discharge status: Home / Self Care | Payer: Medicare Other | Source: Ambulatory Visit | Attending: Emergency Medicine | Admitting: Emergency Medicine

## 2017-02-02 ENCOUNTER — Ambulatory Visit: Payer: Medicare Other | Admitting: Emergency Medicine

## 2017-02-02 DIAGNOSIS — R918 Other nonspecific abnormal finding of lung field: Secondary | ICD-10-CM | POA: Diagnosis not present

## 2017-02-02 LAB — GLUCOSE, CAPILLARY
GLUCOSE-CAPILLARY: 246 mg/dL — AB (ref 65–99)
GLUCOSE-CAPILLARY: 264 mg/dL — AB (ref 65–99)

## 2017-02-02 MED ORDER — FLUDEOXYGLUCOSE F - 18 (FDG) INJECTION: 7.3000 | Freq: Once | INTRAVENOUS | Status: DC | PRN

## 2017-02-03 ENCOUNTER — Telehealth: Payer: Self-pay

## 2017-02-03 ENCOUNTER — Encounter: Payer: Self-pay | Admitting: Gastroenterology

## 2017-02-03 ENCOUNTER — Telehealth: Payer: Self-pay | Admitting: Emergency Medicine

## 2017-02-03 ENCOUNTER — Ambulatory Visit (INDEPENDENT_AMBULATORY_CARE_PROVIDER_SITE_OTHER): Payer: Medicare Other | Admitting: Gastroenterology

## 2017-02-03 VITALS — BP 152/60 | HR 80 | Ht 63.0 in | Wt 152.2 lb

## 2017-02-03 DIAGNOSIS — K625 Hemorrhage of anus and rectum: Secondary | ICD-10-CM

## 2017-02-03 MED ORDER — NA SULFATE-K SULFATE-MG SULF 17.5-3.13-1.6 GM/177ML PO SOLN: 1.0000 | Freq: Once | ORAL | 0 refills | Status: AC

## 2017-02-03 NOTE — Telephone Encounter (Signed)
Pt has rescheduled several times due to transportation. We had to reschedule her on Monday due to the weather. She has once again rescheduled from 02/04/17 to 03/26/17 to review her PET scan results. Pt is questioning if RB will just call her with the results of her PET scan.  RB - please advise. Thanks.

## 2017-02-03 NOTE — Telephone Encounter (Signed)
02/03/2017   RE: Alejandra Hernandez DOB: October 01, 1956 MRN: [id number]   Dear Dr Burr Medico,    We have scheduled the above patient for an endoscopic procedure. Our records show that she is on anticoagulation therapy.   Please advise as to how long the patient may come off her therapy of Xarelto prior to the procedure, which is scheduled for 02/12/17.  Please fax back/ or route to Azharia Surratt at [phone].   Sincerely,    Christian Mate RN

## 2017-02-03 NOTE — Telephone Encounter (Signed)
I called the patient and left message. Briefly described the RUL findings and improvement. Need to discuss the rest of the scan w her, especially suspicious liver lesions. I will call her back 3/14

## 2017-02-03 NOTE — Patient Instructions (Signed)
You will be set up for a colonoscopy for rectal bleeding next Thursday at [facility] (22nd).  Will communicate with your PCP at [facility] about holding your xarelto for 1 day prior to the colonoscopy.
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
## 2017-02-04 ENCOUNTER — Ambulatory Visit: Payer: Medicare Other | Admitting: Emergency Medicine

## 2017-02-04 NOTE — Telephone Encounter (Signed)
The pt given the recommendation to stop xarelto on 3/21.  Pt verbalized understanding of the recommendations.

## 2017-02-04 NOTE — Telephone Encounter (Incomplete)
Hello Alejandra Hernandez,  Patient should take last dose two days prior to procedure, so if the procedure is 3/22, she should skip xarelto on 3/21 and skip xarelto on 3/22.  Plan to resume xarelto on post-op day 1 (3/23).  Please feel free to message me or call our office with nay

## 2017-02-04 NOTE — Telephone Encounter (Signed)
Hello Alejandra Hernandez,  Patient should take last dose two days prior to procedure, so if the procedure is 3/22, she should skip xarelto on 3/21 and skip xarelto on 3/22.  Plan to resume xarelto on post-op day 1 (3/23).  Please feel free to message me back or call our office with any questions. Our clinic's number is [phone].   Thank you, Everrett Coombe MD

## 2017-02-04 NOTE — Telephone Encounter (Signed)
Attempted to call, left message to call me back

## 2017-02-04 NOTE — Telephone Encounter (Signed)
Patient returned Dr. Agustina Caroli call, who was in a room with a patient.  Dr. Lamonte Sakai, please call patient at [phone].

## 2017-02-04 NOTE — Telephone Encounter (Signed)
RB please advise after you call patient.  Thanks!

## 2017-02-06 NOTE — Telephone Encounter (Signed)
Patient returning call - she can be reached at [phone] -pr

## 2017-02-09 ENCOUNTER — Telehealth: Payer: Self-pay | Admitting: Emergency Medicine

## 2017-02-09 ENCOUNTER — Encounter: Payer: Self-pay | Admitting: Cardiology

## 2017-02-09 ENCOUNTER — Ambulatory Visit: Payer: Medicare Other | Admitting: Family Medicine

## 2017-02-09 ENCOUNTER — Other Ambulatory Visit: Payer: Self-pay | Admitting: Cardiology

## 2017-02-09 ENCOUNTER — Ambulatory Visit (INDEPENDENT_AMBULATORY_CARE_PROVIDER_SITE_OTHER): Payer: Medicare Other | Admitting: Cardiology

## 2017-02-09 VITALS — BP 148/62 | HR 90 | Ht 63.0 in | Wt 154.0 lb

## 2017-02-09 DIAGNOSIS — I119 Hypertensive heart disease without heart failure: Secondary | ICD-10-CM

## 2017-02-09 DIAGNOSIS — I739 Peripheral vascular disease, unspecified: Secondary | ICD-10-CM

## 2017-02-09 DIAGNOSIS — I5032 Chronic diastolic (congestive) heart failure: Secondary | ICD-10-CM | POA: Insufficient documentation

## 2017-02-09 DIAGNOSIS — D509 Iron deficiency anemia, unspecified: Secondary | ICD-10-CM | POA: Diagnosis not present

## 2017-02-09 DIAGNOSIS — I779 Disorder of arteries and arterioles, unspecified: Secondary | ICD-10-CM

## 2017-02-09 DIAGNOSIS — R918 Other nonspecific abnormal finding of lung field: Secondary | ICD-10-CM

## 2017-02-09 DIAGNOSIS — R079 Chest pain, unspecified: Secondary | ICD-10-CM | POA: Diagnosis not present

## 2017-02-09 DIAGNOSIS — Z86718 Personal history of other venous thrombosis and embolism: Secondary | ICD-10-CM | POA: Insufficient documentation

## 2017-02-09 DIAGNOSIS — I82403 Acute embolism and thrombosis of unspecified deep veins of lower extremity, bilateral: Secondary | ICD-10-CM | POA: Diagnosis not present

## 2017-02-09 DIAGNOSIS — I10 Essential (primary) hypertension: Secondary | ICD-10-CM | POA: Diagnosis not present

## 2017-02-09 LAB — CBC WITH DIFFERENTIAL/PLATELET
BASOS ABS: 0 10*3/uL (ref 0.0–0.2)
Basos: 0 %
EOS (ABSOLUTE): 0.2 10*3/uL (ref 0.0–0.4)
Eos: 2 %
HEMATOCRIT: 27.9 % — AB (ref 34.0–46.6)
Hemoglobin: 9 g/dL — ABNORMAL LOW (ref 11.1–15.9)
LYMPHS: 25 %
Lymphocytes Absolute: 2.8 10*3/uL (ref 0.7–3.1)
MCH: 23.6 pg — ABNORMAL LOW (ref 26.6–33.0)
MCHC: 32.3 g/dL (ref 31.5–35.7)
MCV: 73 fL — ABNORMAL LOW (ref 79–97)
MONOS ABS: 0.8 10*3/uL (ref 0.1–0.9)
Monocytes: 7 %
NEUTROS PCT: 66 %
Neutrophils Absolute: 7.4 10*3/uL — ABNORMAL HIGH (ref 1.4–7.0)
Platelets: 278 10*3/uL (ref 150–379)
RBC: 3.81 x10E6/uL (ref 3.77–5.28)
RDW: 16.7 % — ABNORMAL HIGH (ref 12.3–15.4)
WBC: 11.1 10*3/uL — AB (ref 3.4–10.8)

## 2017-02-09 LAB — BASIC METABOLIC PANEL
BUN/Creatinine Ratio: 9 — ABNORMAL LOW (ref 12–28)
BUN: 14 mg/dL (ref 8–27)
CALCIUM: 8.8 mg/dL (ref 8.7–10.3)
CO2: 25 mmol/L (ref 18–29)
Chloride: 98 mmol/L (ref 96–106)
Creatinine, Ser: 1.62 mg/dL — ABNORMAL HIGH (ref 0.57–1.00)
GFR calc Af Amer: 39 mL/min/{1.73_m2} — ABNORMAL LOW (ref >59)
GFR calc non Af Amer: 34 mL/min/{1.73_m2} — ABNORMAL LOW (ref >59)
Glucose: 220 mg/dL — ABNORMAL HIGH (ref 65–99)
POTASSIUM: 3.9 mmol/L (ref 3.5–5.2)
SODIUM: 134 mmol/L (ref 134–144)

## 2017-02-09 MED ORDER — NITROGLYCERIN 0.4 MG SL SUBL: 0.4000 mg | SUBLINGUAL_TABLET | SUBLINGUAL | 3 refills | Status: DC | PRN

## 2017-02-09 MED ORDER — ISOSORBIDE MONONITRATE ER 30 MG PO TB24: 30.0000 mg | ORAL_TABLET | Freq: Every day | ORAL | 11 refills | Status: DC

## 2017-02-09 NOTE — Patient Instructions (Signed)
Medication Instructions:  1) INCREASE IMDUR to 30 mg daily  Labwork: TODAY: BMET, CBC  Testing/Procedures: Dr. Radford Pax recommends you have a CORONARY CT.  Your physician has requested that you have a carotid duplex in July, 2018. This test is an ultrasound of the carotid arteries in your neck. It looks at blood flow through these arteries that supply the brain with blood. Allow one hour for this exam. There are no restrictions or special instructions.  Follow-Up: You have been referred to DR. BYRUM (Pulmonary) ASAP for evaluation of lung mass.  Your physician recommends that you schedule a follow-up appointment in 3 MONTHS with Dr. Radford Pax.  Any Other Special Instructions Will Be Listed Below (If Applicable).     If you need a refill on your cardiac medications before your next appointment, please call your pharmacy.
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
## 2017-02-09 NOTE — Telephone Encounter (Signed)
We have attempted to see the pt on 4 separate occassions. The pt has canceled all but 1 of them, this appointment was canceled by our office due to the weather. We do not have any available appointments before the day the pt is already scheduled. Attempted to call Cardiology, was placed on a long hold. Will try back.

## 2017-02-10 ENCOUNTER — Encounter (HOSPITAL_COMMUNITY): Payer: Self-pay | Admitting: *Deleted

## 2017-02-10 NOTE — Telephone Encounter (Signed)
Discussed the PET results with her today - as below the RUL lesion has shrunk, is likely resolving PNA. The hepatic lesions are more worrisome, etiology unclear. She likely needs a MRI to better define. She has an appt with Dr Ardis Hughs at GI on 3/22 to evaluate another issue. I have asked her to discuss this issue with him as well, specifically to decide whether she needs an MRI. I will forward this note to him as well.

## 2017-02-10 NOTE — Telephone Encounter (Signed)
LMTCB for the pt 

## 2017-02-11 ENCOUNTER — Encounter: Payer: Self-pay | Admitting: Cardiology

## 2017-02-11 NOTE — Telephone Encounter (Signed)
Rob, She's coming for a colonoscopy tomorrow (rectal bleeding). I'll decide about MRI after that.  Thanks  DJ

## 2017-02-12 ENCOUNTER — Telehealth: Payer: Self-pay

## 2017-02-12 ENCOUNTER — Encounter (HOSPITAL_COMMUNITY): Payer: Self-pay | Admitting: Gastroenterology

## 2017-02-12 ENCOUNTER — Ambulatory Visit (HOSPITAL_COMMUNITY)
Admission: RE | Admit: 2017-02-12 | Discharge: 2017-02-12 | Disposition: A | Discharge status: Home / Self Care | Payer: Medicare Other | Source: Ambulatory Visit | Attending: Gastroenterology | Admitting: Gastroenterology

## 2017-02-12 ENCOUNTER — Ambulatory Visit (HOSPITAL_COMMUNITY): Payer: Medicare Other | Admitting: Anesthesiology

## 2017-02-12 ENCOUNTER — Encounter (HOSPITAL_COMMUNITY)
Admission: RE | Disposition: A | Discharge status: Home / Self Care | Payer: Self-pay | Source: Ambulatory Visit | Attending: Gastroenterology

## 2017-02-12 DIAGNOSIS — K769 Liver disease, unspecified: Secondary | ICD-10-CM

## 2017-02-12 DIAGNOSIS — K921 Melena: Secondary | ICD-10-CM | POA: Diagnosis not present

## 2017-02-12 DIAGNOSIS — I509 Heart failure, unspecified: Secondary | ICD-10-CM | POA: Diagnosis not present

## 2017-02-12 DIAGNOSIS — D125 Benign neoplasm of sigmoid colon: Secondary | ICD-10-CM | POA: Diagnosis not present

## 2017-02-12 DIAGNOSIS — Z794 Long term (current) use of insulin: Secondary | ICD-10-CM | POA: Insufficient documentation

## 2017-02-12 DIAGNOSIS — K219 Gastro-esophageal reflux disease without esophagitis: Secondary | ICD-10-CM | POA: Insufficient documentation

## 2017-02-12 DIAGNOSIS — E1142 Type 2 diabetes mellitus with diabetic polyneuropathy: Secondary | ICD-10-CM | POA: Diagnosis not present

## 2017-02-12 DIAGNOSIS — I739 Peripheral vascular disease, unspecified: Secondary | ICD-10-CM | POA: Diagnosis not present

## 2017-02-12 DIAGNOSIS — G8929 Other chronic pain: Secondary | ICD-10-CM | POA: Insufficient documentation

## 2017-02-12 DIAGNOSIS — G709 Myoneural disorder, unspecified: Secondary | ICD-10-CM | POA: Insufficient documentation

## 2017-02-12 DIAGNOSIS — M109 Gout, unspecified: Secondary | ICD-10-CM | POA: Insufficient documentation

## 2017-02-12 DIAGNOSIS — F419 Anxiety disorder, unspecified: Secondary | ICD-10-CM | POA: Insufficient documentation

## 2017-02-12 DIAGNOSIS — D124 Benign neoplasm of descending colon: Secondary | ICD-10-CM | POA: Diagnosis not present

## 2017-02-12 DIAGNOSIS — Z87891 Personal history of nicotine dependence: Secondary | ICD-10-CM | POA: Diagnosis not present

## 2017-02-12 DIAGNOSIS — M1990 Unspecified osteoarthritis, unspecified site: Secondary | ICD-10-CM | POA: Insufficient documentation

## 2017-02-12 DIAGNOSIS — I110 Hypertensive heart disease with heart failure: Secondary | ICD-10-CM | POA: Insufficient documentation

## 2017-02-12 DIAGNOSIS — Z79899 Other long term (current) drug therapy: Secondary | ICD-10-CM | POA: Insufficient documentation

## 2017-02-12 DIAGNOSIS — Z8719 Personal history of other diseases of the digestive system: Secondary | ICD-10-CM | POA: Insufficient documentation

## 2017-02-12 DIAGNOSIS — D649 Anemia, unspecified: Secondary | ICD-10-CM | POA: Diagnosis not present

## 2017-02-12 DIAGNOSIS — K625 Hemorrhage of anus and rectum: Secondary | ICD-10-CM

## 2017-02-12 DIAGNOSIS — N179 Acute kidney failure, unspecified: Secondary | ICD-10-CM | POA: Diagnosis not present

## 2017-02-12 DIAGNOSIS — E785 Hyperlipidemia, unspecified: Secondary | ICD-10-CM | POA: Insufficient documentation

## 2017-02-12 HISTORY — PX: COLONOSCOPY WITH PROPOFOL: SHX5780

## 2017-02-12 LAB — GLUCOSE, CAPILLARY
GLUCOSE-CAPILLARY: 181 mg/dL — AB (ref 65–99)
Glucose-Capillary: 246 mg/dL — ABNORMAL HIGH (ref 65–99)

## 2017-02-12 SURGERY — COLONOSCOPY WITH PROPOFOL
Anesthesia: Monitor Anesthesia Care

## 2017-02-12 MED ORDER — LIDOCAINE 2% (20 MG/ML) 5 ML SYRINGE
INTRAMUSCULAR | Status: AC
  Filled 2017-02-12: qty 5

## 2017-02-12 MED ORDER — PROPOFOL 10 MG/ML IV BOLUS
INTRAVENOUS | Status: DC | PRN
  Administered 2017-02-12 (×4): 20 mg via INTRAVENOUS
  Administered 2017-02-12: 10 mg via INTRAVENOUS

## 2017-02-12 MED ORDER — PROPOFOL 10 MG/ML IV BOLUS
INTRAVENOUS | Status: AC
  Filled 2017-02-12: qty 60

## 2017-02-12 MED ORDER — LIDOCAINE 2% (20 MG/ML) 5 ML SYRINGE
INTRAMUSCULAR | Status: DC | PRN
  Administered 2017-02-12: 60 mg via INTRAVENOUS

## 2017-02-12 MED ORDER — INSULIN ASPART 100 UNIT/ML ~~LOC~~ SOLN
5.0000 [IU] | Freq: Once | SUBCUTANEOUS | Status: AC
  Administered 2017-02-12: 5 [IU] via SUBCUTANEOUS
  Filled 2017-02-12 (×2): qty 0.05

## 2017-02-12 MED ORDER — PROPOFOL 500 MG/50ML IV EMUL
INTRAVENOUS | Status: DC | PRN
  Administered 2017-02-12: 100 ug/kg/min via INTRAVENOUS

## 2017-02-12 MED ORDER — SODIUM CHLORIDE 0.9 % IV SOLN
INTRAVENOUS | Status: DC
  Administered 2017-02-12: 12:00:00 via INTRAVENOUS

## 2017-02-12 SURGICAL SUPPLY — 21 items

## 2017-02-12 NOTE — Anesthesia Preprocedure Evaluation (Addendum)
Anesthesia Evaluation  Patient identified by MRN, date of birth, ID band Patient awake    Reviewed: Allergy & Precautions, Patient's Chart, lab work & pertinent test results  Airway Mallampati: III  TM Distance: <3 FB Neck ROM: Full    Dental  (+) Edentulous Upper   Pulmonary former smoker,    breath sounds clear to auscultation       Cardiovascular hypertension, + Peripheral Vascular Disease and +CHF   Rhythm:Regular     Neuro/Psych  Neuromuscular disease    GI/Hepatic GERD  Controlled,  Endo/Other  diabetes  Renal/GU Renal InsufficiencyRenal disease     Musculoskeletal  (+) Arthritis ,   Abdominal   Peds  Hematology  (+) anemia ,   Anesthesia Other Findings   Reproductive/Obstetrics                            Anesthesia Physical Anesthesia Plan  ASA: III  Anesthesia Plan: MAC   Post-op Pain Management:    Induction: Intravenous  Airway Management Planned: Natural Airway and Simple Face Mask  Additional Equipment:   Intra-op Plan:   Post-operative Plan:   Informed Consent:   Dental advisory given  Plan Discussed with: CRNA  Anesthesia Plan Comments:         Anesthesia Quick Evaluation

## 2017-02-12 NOTE — Interval H&P Note (Signed)
History and Physical Interval Note:  02/12/2017 11:02 AM  Alejandra Hernandez  has presented today for surgery, with the diagnosis of rectal bleeding  The various methods of treatment have been discussed with the patient and family. After consideration of risks, benefits and other options for treatment, the patient has consented to  Procedure(s): COLONOSCOPY WITH PROPOFOL (N/A) as a surgical intervention .  The patient's history has been reviewed, patient examined, no change in status, stable for surgery.  I have reviewed the patient's chart and labs.  Questions were answered to the patient's satisfaction.     Milus Banister

## 2017-02-12 NOTE — Transfer of Care (Signed)
Immediate Anesthesia Transfer of Care Note  Patient: Alejandra Hernandez  Procedure(s) Performed: Procedure(s): COLONOSCOPY WITH PROPOFOL (N/A)  Patient Location: PACU and Endoscopy Unit  Anesthesia Type:MAC  Level of Consciousness: patient cooperative  Airway & Oxygen Therapy: Patient Spontanous Breathing and Patient connected to face mask oxygen  Post-op Assessment: Report given to RN and Post -op Vital signs reviewed and stable  Post vital signs: Reviewed and stable  Last Vitals:  Vitals:   02/12/17 1114  BP: (!) 172/66  Resp: 13  Temp: 36.7 C    Last Pain:  Vitals:   02/12/17 1114  TempSrc: Oral  PainSc: 6          Complications: No apparent anesthesia complications

## 2017-02-12 NOTE — Telephone Encounter (Signed)
Called and spoke with pt and she stated that [facility] did call her the same day that she was seen by Dr. Radford Pax.  She stated that RB told her that he would send the copy of the PET scan to her GI doctor to follow up on the liver issues.  She is aware to keep her appt with RB in may.  Nothing further is needed.

## 2017-02-12 NOTE — Op Note (Signed)
[facility] Patient Name: Carle Dargan Procedure Date: 02/12/2017 MRN: [id number] Attending MD: Milus Banister , MD Date of Birth: November 07, 1956 CSN: [id number] Age: 61 Admit Type: Outpatient Procedure:                Colonoscopy Indications:              Hematochezia; improving large lung mass, also PET                            avid liver lesions recently noted. Providers:                Milus Banister, MD, Laverta Baltimore RN, RN,                            Cherylynn Ridges, Technician, Dione Booze, CRNA Referring MD:              Medicines:                Monitored Anesthesia Care Complications:            No immediate complications. Estimated blood loss:                            None. Estimated Blood Loss:     Estimated blood loss: none. Procedure:                Pre-Anesthesia Assessment:                           - Prior to the procedure, a History and Physical                            was performed, and patient medications and                            allergies were reviewed. The patient's tolerance of                            previous anesthesia was also reviewed. The risks                            and benefits of the procedure and the sedation                            options and risks were discussed with the patient.                            All questions were answered, and informed consent                            was obtained. Prior Anticoagulants: The patient has                            taken Xarelto (rivaroxaban), last dose was 3 days  prior to procedure. ASA Grade Assessment: III - A                            patient with severe systemic disease. After                            reviewing the risks and benefits, the patient was                            deemed in satisfactory condition to undergo the                            procedure.                           After obtaining informed consent, the  colonoscope                            was passed under direct vision. Throughout the                            procedure, the patient's blood pressure, pulse, and                            oxygen saturations were monitored continuously. The                            Colonoscope was introduced through the anus and                            advanced to the the cecum, identified by                            appendiceal orifice and ileocecal valve. The                            colonoscopy was performed without difficulty. The                            patient tolerated the procedure well. The quality                            of the bowel preparation was good. The ileocecal                            valve, appendiceal orifice, and rectum were                            photographed. Scope In: 12:22:32 PM Scope Out: 12:52:37 PM Scope Withdrawal Time: 0 hours 23 minutes 15 seconds  Total Procedure Duration: 0 hours 30 minutes 5 seconds  Findings:      Five sessile polyps were found in the sigmoid colon and descending       colon. The polyps were 4 to 7 mm in size. These polyps  were removed with       a cold snare. Resection and retrieval were complete.      Four pedunculated and sessile polyps were found in the sigmoid colon and       descending colon. The polyps were 10 to 15 mm in size. These polyps were       removed with a hot snare. Resection and retrieval were complete.      The exam was otherwise without abnormality on direct and retroflexion       views. Impression:               - Five 4 to 7 mm polyps in the sigmoid colon and in                            the descending colon, removed with a cold snare.                            Resected and retrieved.                           - Four 10 to 15 mm polyps in the sigmoid colon and                            in the descending colon, removed with a hot snare.                            Resected and retrieved.                            - The examination was otherwise normal on direct                            and retroflexion views. Moderate Sedation:      N/A- Per Anesthesia Care Recommendation:           - Patient has a contact number available for                            emergencies. The signs and symptoms of potential                            delayed complications were discussed with the                            patient. Return to normal activities tomorrow.                            Written discharge instructions were provided to the                            patient.                           - Resume previous diet.                           -  Continue present medications. OK to restart your                            xarelto tomorrow.                           - None of these polyps explain the PET avid liver                            lesions. My office will arrange MRI of liver for                            further evaluation.                           You will receive a letter within 2-3 weeks with the                            pathology results and my final recommendations.                           If the polyp(s) is proven to be 'pre-cancerous' on                            pathology, you will need repeat colonoscopy in 3                            years. If the polyp(s) is NOT 'precancerous' on                            pathology then you should repeat colon cancer                            screening in 10 years with colonoscopy without need                            for colon cancer screening by any method prior to                            then (including stool testing). Procedure Code(s):        --- Professional ---                           [id number], Colonoscopy, flexible; with removal of                            tumor(s), polyp(s), or other lesion(s) by snare                            technique Diagnosis Code(s):        --- Professional ---                           D12.5,  Benign neoplasm of sigmoid colon  D12.4, Benign neoplasm of descending colon                           K92.1, Melena (includes Hematochezia) CPT copyright 2016 American Medical Association. All rights reserved. The codes documented in this report are preliminary and upon coder review may  be revised to meet current compliance requirements. Milus Banister, MD 02/12/2017 12:56:58 PM This report has been signed electronically. Number of Addenda: 0

## 2017-02-12 NOTE — Discharge Instructions (Signed)
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
## 2017-02-12 NOTE — Telephone Encounter (Signed)
-----   Message from Milus Banister, MD sent at 02/12/2017 12:58 PM EDT ----- Chong Sicilian, She needs MRi of liver (with and without contrast) for PET avid liver lesions.   Rob, I found several decent sized colon polyps but not are frankly cancer and probably do not explain her PET avid liver masses.  I'm getting an MRI of the liver.  FYI.  Radonna Ricker

## 2017-02-12 NOTE — Anesthesia Procedure Notes (Signed)
Procedure Name: MAC Date/Time: 02/12/2017 12:15 PM Performed by: Dione Booze Pre-anesthesia Checklist: Patient identified, Emergency Drugs available, Suction available and Patient being monitored Patient Re-evaluated:Patient Re-evaluated prior to inductionOxygen Delivery Method: Simple face mask Placement Confirmation: positive ETCO2

## 2017-02-13 NOTE — Anesthesia Postprocedure Evaluation (Addendum)
Anesthesia Post Note  Patient: Alejandra Hernandez  Procedure(s) Performed: Procedure(s) (LRB): COLONOSCOPY WITH PROPOFOL (N/A)  Patient location during evaluation: Endoscopy Anesthesia Type: MAC Level of consciousness: awake and alert Pain management: pain level controlled Vital Signs Assessment: post-procedure vital signs reviewed and stable Respiratory status: spontaneous breathing, nonlabored ventilation, respiratory function stable and patient connected to nasal cannula oxygen Cardiovascular status: stable and blood pressure returned to baseline Anesthetic complications: no       Last Vitals:  Vitals:   02/12/17 1320 02/12/17 1330  BP: (!) 187/80 (!) 174/80  Pulse: 88 88  Resp: 20 18  Temp:      Last Pain:  Vitals:   02/12/17 1330  TempSrc:   PainSc: 6                  Kamyiah Colantonio,JAMES TERRILL

## 2017-02-15 ENCOUNTER — Encounter (HOSPITAL_COMMUNITY): Payer: Self-pay | Admitting: Gastroenterology

## 2017-02-16 ENCOUNTER — Other Ambulatory Visit: Payer: Self-pay

## 2017-02-16 DIAGNOSIS — K769 Liver disease, unspecified: Secondary | ICD-10-CM

## 2017-02-17 ENCOUNTER — Other Ambulatory Visit: Payer: Self-pay

## 2017-02-17 ENCOUNTER — Ambulatory Visit: Payer: Medicare Other | Admitting: Family Medicine

## 2017-02-17 DIAGNOSIS — K769 Liver disease, unspecified: Secondary | ICD-10-CM

## 2017-02-17 NOTE — Telephone Encounter (Signed)
  Pt has been scheduled for 02/25/17 3 pm

## 2017-02-25 ENCOUNTER — Ambulatory Visit (HOSPITAL_COMMUNITY)
Admission: RE | Admit: 2017-02-25 | Discharge: 2017-02-25 | Disposition: A | Discharge status: Home / Self Care | Payer: Medicare Other | Source: Ambulatory Visit | Attending: Gastroenterology | Admitting: Gastroenterology

## 2017-02-25 DIAGNOSIS — K769 Liver disease, unspecified: Secondary | ICD-10-CM | POA: Diagnosis not present

## 2017-02-25 MED ORDER — GADOBENATE DIMEGLUMINE 529 MG/ML IV SOLN
10.0000 mL | Freq: Once | INTRAVENOUS | Status: AC
  Administered 2017-02-25: 7 mL via INTRAVENOUS

## 2017-02-26 ENCOUNTER — Ambulatory Visit (HOSPITAL_COMMUNITY)
Admission: RE | Admit: 2017-02-26 | Discharge: 2017-02-26 | Disposition: A | Discharge status: Home / Self Care | Payer: Medicare Other | Source: Ambulatory Visit | Attending: Cardiology | Admitting: Cardiology

## 2017-02-26 ENCOUNTER — Encounter (HOSPITAL_COMMUNITY): Payer: Self-pay

## 2017-02-26 DIAGNOSIS — R079 Chest pain, unspecified: Secondary | ICD-10-CM

## 2017-02-26 DIAGNOSIS — K769 Liver disease, unspecified: Secondary | ICD-10-CM | POA: Diagnosis not present

## 2017-02-26 MED ORDER — IOPAMIDOL (ISOVUE-370) INJECTION 76%
INTRAVENOUS | Status: AC
  Filled 2017-02-26: qty 100

## 2017-02-27 ENCOUNTER — Other Ambulatory Visit: Payer: Self-pay | Admitting: Cardiology

## 2017-02-27 DIAGNOSIS — R079 Chest pain, unspecified: Secondary | ICD-10-CM

## 2017-03-02 ENCOUNTER — Encounter: Payer: Self-pay | Admitting: Obstetrics and Gynecology

## 2017-03-02 ENCOUNTER — Ambulatory Visit (INDEPENDENT_AMBULATORY_CARE_PROVIDER_SITE_OTHER): Payer: Medicare Other | Admitting: Obstetrics and Gynecology

## 2017-03-02 VITALS — BP 140/80 | HR 96 | Temp 98.1°F | Ht 63.0 in | Wt 148.0 lb

## 2017-03-02 DIAGNOSIS — I872 Venous insufficiency (chronic) (peripheral): Secondary | ICD-10-CM

## 2017-03-02 NOTE — Patient Instructions (Signed)
Hemosiderin deposits in legs  Chronic Venous Insufficiency Chronic venous insufficiency, also called venous stasis, is a condition that prevents blood from being pumped effectively through the veins in your legs. Blood may no longer be pumped effectively from the legs back to the heart. This condition can range from mild to severe. With proper treatment, you should be able to continue with an active life. What are the causes? Chronic venous insufficiency occurs when the vein walls become stretched, weakened, or damaged, or when valves within the vein are damaged. Some common causes of this include:  High blood pressure inside the veins (venous hypertension).  Increased blood pressure in the leg veins from long periods of sitting or standing.  A blood clot that blocks blood flow in a vein (deep vein thrombosis, DVT).  Inflammation of a vein (phlebitis) that causes a blood clot to form.  Tumors in the pelvis that cause blood to back up. What increases the risk? The following factors may make you more likely to develop this condition:  Having a family history of this condition.  Obesity.  Pregnancy.  Living without enough physical activity or exercise (sedentary lifestyle).  Smoking.  Having a job that requires long periods of standing or sitting in one place.  Being a certain age. Women in their 71s and 28s and men in their 40s are more likely to develop this condition. What are the signs or symptoms? Symptoms of this condition include:  Veins that are enlarged, bulging, or twisted (varicose veins).  Skin breakdown or ulcers.  Reddened or discolored skin on the front of the leg.  Brown, smooth, tight, and painful skin just above the ankle, usually on the inside of the leg (lipodermatosclerosis).  Swelling. How is this diagnosed? This condition may be diagnosed based on:  Your medical history.  A physical exam.  Tests, such as:  A procedure that creates an image of a  blood vessel and nearby organs and provides information about blood flow through the blood vessel (duplex ultrasound).  A procedure that tests blood flow (plethysmography).  A procedure to look at the veins using X-ray and dye (venogram). How is this treated? The goals of treatment are to help you return to an active life and to minimize pain or disability. Treatment depends on the severity of your condition, and it may include:  Wearing compression stockings. These can help relieve symptoms and help prevent your condition from getting worse. However, they do not cure the condition.  Sclerotherapy. This is a procedure involving an injection of a material that "dissolves" damaged veins.  Surgery. This may involve:  Removing a diseased vein (vein stripping).  Cutting off blood flow through the vein (laser ablation surgery).  Repairing a valve. Follow these instructions at home:  Wear compression stockings as told by your health care provider. These stockings help to prevent blood clots and reduce swelling in your legs.  Take over-the-counter and prescription medicines only as told by your health care provider.  Stay active by exercising, walking, or doing different activities. Ask your health care provider what activities are safe for you and how much exercise you need.  Drink enough fluid to keep your urine clear or pale yellow.  Do not use any products that contain nicotine or tobacco, such as cigarettes and e-cigarettes. If you need help quitting, ask your health care provider.  Keep all follow-up visits as told by your health care provider. This is important. Contact a health care provider if:  You  have redness, swelling, or more pain in the affected area.  You see a red streak or line that extends up or down from the affected area.  You have skin breakdown or a loss of skin in the affected area, even if the breakdown is small.  You get an injury in the affected area. Get  help right away if:  You get an injury and an open wound in the affected area.  You have severe pain that does not get better with medicine.  You have sudden numbness or weakness in the foot or ankle below the affected area, or you have trouble moving your foot or ankle.  You have a fever and you have worse or persistent symptoms.  You have chest pain.  You have shortness of breath. Summary  Chronic venous insufficiency, also called venous stasis, is a condition that prevents blood from being pumped effectively through the veins in your legs.  Chronic venous insufficiency occurs when the vein walls become stretched, weakened, or damaged, or when valves within the vein are damaged.  Treatment for this condition depends on how severe your condition is, and it may involve wearing compression stockings or having a procedure.  Make sure you stay active by exercising, walking, or doing different activities. Ask your health care provider what activities are safe for you and how much exercise you need. This information is not intended to replace advice given to you by your health care provider. Make sure you discuss any questions you have with your health care provider. Document Released: 03/16/2007 Document Revised: 09/29/2016 Document Reviewed: 09/29/2016 Elsevier Interactive Patient Education  2017 [employer].

## 2017-03-02 NOTE — Progress Notes (Signed)
   Subjective:   Patient ID: Alejandra Hernandez, female    DOB: Jan 18, 1956, 61 y.o.   MRN: [id number]  Patient presents for Same Day Appointment  Chief Complaint  Patient presents with  . foot spot    heel spreading x 37months    HPI: # Foot, Left Foot turning dark and purple Home nurse gave her ointment to put on it Going on now for 6-7 weeks getting worse Has stent in left leg  Pain on top of foot  Sugars more elevated 365 in AM after food No fevers, some chills Has edema No warmth, no weakness  Review of Systems   See HPI for ROS.   History  Smoking Status  . Former Smoker  . Packs/day: 0.50  . Years: 40.00  . Types: Cigarettes  . Quit date: 05/10/2016  Smokeless Tobacco  . Former Systems developer  . Types: Snuff  . Quit date: 11/24/1972    Past medical history, surgical, family, and social history reviewed and updated in the EMR as appropriate.  Pertinent Historical Findings include: diastolic HF, PAD Objective:  BP 140/80 (BP Location: Right Arm, Patient Position: Sitting, Cuff Size: Normal)   Pulse 96   Temp 98.1 F (36.7 C) (Oral)   Wt 148 lb (67.1 kg)   SpO2 94%   BMI 26.22 kg/m  Vitals and nursing note reviewed  Physical Exam  Constitutional: She is well-developed, well-nourished, and in no distress.  Cardiovascular: Normal rate.   Pulmonary/Chest: Effort normal.  Musculoskeletal: Normal range of motion. She exhibits edema.  Bilateral feet with hyperpigmentation and discoloration (L>R). No surrounding erythema or warmth. Non-tender to palpation.   Skin: Skin is warm and dry. No erythema.    Assessment & Plan:  1. Chronic venous insufficiency Skin changes on foot consistent with hemosiderin deposition from chronic venous insuffiencey. Patient has no signs of cellulitis. No red flags. Has edema but no skin breakdown. Known h/o PAD with claudication and stents to left leg. Both legs have color changes. Counseled patient on diagnosis and need to elevate her feet and  control edema. Return precautions given   Diagnosis and plan were discussed in detail with this patient today. The patient verbalized understanding and agreed with the plan.   PATIENT EDUCATION PROVIDED: See AVS   Luiz Blare, DO 03/02/2017, 4:20 PM PGY-3, [place]

## 2017-03-04 ENCOUNTER — Ambulatory Visit (HOSPITAL_COMMUNITY): Payer: Medicare Other

## 2017-03-05 ENCOUNTER — Other Ambulatory Visit: Payer: Self-pay | Admitting: Cardiology

## 2017-03-05 ENCOUNTER — Ambulatory Visit (HOSPITAL_COMMUNITY)
Admission: RE | Admit: 2017-03-05 | Discharge: 2017-03-05 | Disposition: A | Discharge status: Home / Self Care | Payer: Medicare Other | Source: Ambulatory Visit | Attending: Cardiology | Admitting: Cardiology

## 2017-03-05 ENCOUNTER — Encounter (HOSPITAL_COMMUNITY): Payer: Self-pay | Admitting: Radiology

## 2017-03-05 DIAGNOSIS — R079 Chest pain, unspecified: Secondary | ICD-10-CM | POA: Insufficient documentation

## 2017-03-05 DIAGNOSIS — K760 Fatty (change of) liver, not elsewhere classified: Secondary | ICD-10-CM | POA: Insufficient documentation

## 2017-03-05 DIAGNOSIS — R918 Other nonspecific abnormal finding of lung field: Secondary | ICD-10-CM | POA: Diagnosis not present

## 2017-03-05 DIAGNOSIS — I700 Atherosclerosis of aorta: Secondary | ICD-10-CM | POA: Diagnosis not present

## 2017-03-05 HISTORY — PX: IR US GUIDE VASC ACCESS RIGHT: IMG2390

## 2017-03-05 HISTORY — PX: IR RADIOLOGY PERIPHERAL GUIDED IV START: IMG5598

## 2017-03-05 MED ORDER — METOPROLOL TARTRATE 5 MG/5ML IV SOLN
INTRAVENOUS | Status: AC
  Filled 2017-03-05: qty 10

## 2017-03-05 MED ORDER — IOPAMIDOL (ISOVUE-370) INJECTION 76%
INTRAVENOUS | Status: AC
  Administered 2017-03-05: 80 mL via INTRAVENOUS
  Filled 2017-03-05: qty 100

## 2017-03-05 MED ORDER — NITROGLYCERIN 0.4 MG SL SUBL
SUBLINGUAL_TABLET | SUBLINGUAL | Status: DC
Start: 2017-03-05 — End: 2017-03-06
  Filled 2017-03-05: qty 2

## 2017-03-05 MED ORDER — NITROGLYCERIN 0.4 MG SL SUBL
0.8000 mg | SUBLINGUAL_TABLET | Freq: Once | SUBLINGUAL | Status: AC
  Administered 2017-03-05: 0.8 mg via SUBLINGUAL

## 2017-03-05 MED ORDER — LIDOCAINE HCL 1 % IJ SOLN
INTRAMUSCULAR | Status: AC
  Filled 2017-03-05: qty 20

## 2017-03-05 MED ORDER — METOPROLOL TARTRATE 5 MG/5ML IV SOLN
INTRAVENOUS | Status: AC
  Filled 2017-03-05: qty 5

## 2017-03-05 MED ORDER — METOPROLOL TARTRATE 5 MG/5ML IV SOLN
5.0000 mg | INTRAVENOUS | Status: AC | PRN
  Administered 2017-03-05 (×3): 5 mg via INTRAVENOUS

## 2017-03-05 NOTE — Progress Notes (Signed)
Patient ID: Alejandra Hernandez, female   DOB: 1956-05-03, 61 y.o.   MRN: [id number]   Right brachial vein access using US guidance 4 Fr Micropuncture catheter in place For immediate use for cardiac study

## 2017-03-05 NOTE — Progress Notes (Signed)
Patient d/c home with personal transport. VSS.

## 2017-03-10 ENCOUNTER — Encounter: Payer: Self-pay | Admitting: Student in an Organized Health Care Education/Training Program

## 2017-03-10 ENCOUNTER — Ambulatory Visit (INDEPENDENT_AMBULATORY_CARE_PROVIDER_SITE_OTHER): Payer: Medicare Other | Admitting: Student in an Organized Health Care Education/Training Program

## 2017-03-10 DIAGNOSIS — I82403 Acute embolism and thrombosis of unspecified deep veins of lower extremity, bilateral: Secondary | ICD-10-CM

## 2017-03-10 DIAGNOSIS — E1151 Type 2 diabetes mellitus with diabetic peripheral angiopathy without gangrene: Secondary | ICD-10-CM

## 2017-03-10 DIAGNOSIS — IMO0002 Reserved for concepts with insufficient information to code with codable children: Secondary | ICD-10-CM

## 2017-03-10 DIAGNOSIS — R918 Other nonspecific abnormal finding of lung field: Secondary | ICD-10-CM

## 2017-03-10 DIAGNOSIS — Z794 Long term (current) use of insulin: Secondary | ICD-10-CM

## 2017-03-10 DIAGNOSIS — E1165 Type 2 diabetes mellitus with hyperglycemia: Secondary | ICD-10-CM

## 2017-03-10 NOTE — Assessment & Plan Note (Addendum)
-   Xarelto started 2/22 >> 15 mg BID through 3/2 - 3/2 started 20 mg QD - plan to continue maintenance dose through 5/3 at least for a 3 month course - patient with anxiety about how this medication works, provided counseling - discussed possibility of needing a longer course, particularly if there is a malignancy discovered, however would be reasonable to stop after 5/3 so that procedures such as cardiac cath can be completed

## 2017-03-10 NOTE — Patient Instructions (Signed)
It was a pleasure seeing you today in our clinic. Today we discussed your blood clots. Here is the treatment plan we have discussed and agreed upon together:  - Please continue to take your Xarelto 20 mg once daily as you have been, this should be continued through May 22.   Please schedule a visit to discuss your diabetes as you leave today.   Our clinic's number is [phone]. Please call with questions or concerns about what we discussed today.  Be well, Dr. Burr Medico

## 2017-03-10 NOTE — Assessment & Plan Note (Addendum)
Seems on med review that patient does not take medications as prescribed. States she takes lantus 20-25 units daily. - HbA1c very elevated at >15.5% in the hospital in february - asked patient to please schedule follow up just to speak about diabetes management, patient has vascular disease and discussed how better diabetes control will be important to avoid requiring more vascular procedures going forward - pt will schedule follow up for diabetes

## 2017-03-10 NOTE — Progress Notes (Signed)
   CC: How does xarelto work for my DVTs?  HPI: Alejandra Hernandez is a 61 y.o. 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  who presents to [facility] today for DVT follow up and to discuss her xarelto treatment.  DVT Patient endorses concern that the xarelto may not work, expresses anxiety over this medication and the clots in her legs. - no palpitations or shortness of breath, occasional chest pain but not worse with inspiration, intermittent, not current  Concern for ?malignancy - recent colonoscopy with benign polyps - patient has appt with pulmonology 5/3 - endorses unintentional weight loss (~8 lbs per chart review since mid February) - has had poor appetite recently  Diabetes - very poorly controlled - A1c >15.5 in hospital - patient endorses taking lantus not as prescribed  - agreeable to follow up appt to discuss diabetes in its own visit - foot exam completed this visit   Review of Symptoms:  See HPI for ROS.   CC, SH/smoking status, and VS noted.  Objective: BP 130/70 (BP Location: Left Arm, Patient Position: Sitting, Cuff Size: Normal)   Pulse 89   Temp 98.1 F (36.7 C) (Oral)   Ht 5\' 3"  (1.6 m)   Wt 147 lb (66.7 kg)   SpO2 95%   BMI 26.04 kg/m  GEN: NAD, alert, cooperative, and pleasant. RESPIRATORY: clear to auscultation bilaterally with no wheezes, rhonchi or rales, good effort CV: RRR, no m/r/g, no peripheral edema GI: soft, non-tender, non-distended SKIN: warm and dry, chronic skin changes over ventral aspect of feet. +calf tenderness bilaterally, +mild LE edema PSYCH: AAOx3, appropriate affect  Assessment and plan:  DVT (deep venous thrombosis) ([place]) - Xarelto started 2/22 >> 15 mg BID through 3/2 - 3/2 started 20 mg QD - plan to continue maintenance dose through 5/3 at least for a 3 month course - patient with anxiety about how this medication works, provided counseling - discussed possibility of needing a longer course, particularly if there  is a malignancy discovered, however would be reasonable to stop after 5/3 so that procedures such as cardiac cath can be completed  Uncontrolled type 2 diabetes mellitus with diabetic peripheral angiopathy without gangrene, with long-term current use of insulin ([place]) Seems on med review that patient does not take medications as prescribed. States she takes lantus 20-25 units daily. - HbA1c very elevated at >15.5% in the hospital in february - asked patient to please schedule follow up just to speak about diabetes management, patient has vascular disease and discussed how better diabetes control will be important to avoid requiring more vascular procedures going forward - pt will schedule follow up for diabetes  Mass of upper lobe of right lung Concern for ?malignancy - unintentional weight loss (8 pounds in 2 months), loss of appetite, bilateral DVTs - patient has pulm f/u 5/3, will continue to follow along   Everrett Coombe, MD,MS,  PGY1 03/10/2017 3:39 PM

## 2017-03-10 NOTE — Assessment & Plan Note (Signed)
Concern for ?malignancy - unintentional weight loss (8 pounds in 2 months), loss of appetite, bilateral DVTs - patient has pulm f/u 5/3, will continue to follow along

## 2017-03-15 ENCOUNTER — Other Ambulatory Visit: Payer: Self-pay | Admitting: Family Medicine

## 2017-03-17 ENCOUNTER — Ambulatory Visit (INDEPENDENT_AMBULATORY_CARE_PROVIDER_SITE_OTHER): Payer: Medicare Other | Admitting: Emergency Medicine

## 2017-03-17 ENCOUNTER — Encounter: Payer: Self-pay | Admitting: Emergency Medicine

## 2017-03-17 DIAGNOSIS — R918 Other nonspecific abnormal finding of lung field: Secondary | ICD-10-CM | POA: Diagnosis not present

## 2017-03-17 DIAGNOSIS — Z720 Tobacco use: Secondary | ICD-10-CM | POA: Insufficient documentation

## 2017-03-17 DIAGNOSIS — Z87891 Personal history of nicotine dependence: Secondary | ICD-10-CM | POA: Insufficient documentation

## 2017-03-17 NOTE — Patient Instructions (Addendum)
We will probably need to repeat your CT scan of your chest in October 2018 to follow right upper lobe scar / nodule.  We will arrange for pulmonary function testing Continue your protonix twice a day Follow with Dr Lamonte Sakai in 2 months or sooner if you have any problems.

## 2017-03-17 NOTE — Progress Notes (Signed)
Subjective:    Patient ID: Alejandra Hernandez, female    DOB: 30-Jan-1956, 61 y.o.   MRN: [id number]  HPI 61 year old woman who follows up after hospitalization in February and she has a history of tobacco use, was admitted with flulike symptoms and DKA. She was found to have an 0.5 cm anterior right upper lobe mass with associated mediastinal lymphadenopathy and a small right effusion. I performed endobronchial ultrasound, right upper lobe and mediastinal node biopsies on 01/09/17.Marland Kitchen Cytology was all negative. Subsequent PET scan performed on 02/02/17 was reviewed by me. This showed significant decrease in size of her right upper lobe mass lesion. There was some associated cavitation and her bronchograms, suggestive of infection. Also noted were 2 regions of uptake in the liver. She was evaluated by Dr. Ardis Hughs gastroenterology. She had some typical adenomatous polyps on colonoscopy. An MRI of the abdomen showed no specific lesions to correspond with the PET scan activity. She still smokes some - every now and then. She has some cough. Has breakthrough GERD sx, some minimal PND.   Review of Systems As per HPI  Past Medical History:  Diagnosis Date  . AKI (acute kidney injury) ([place]) 01/2016  . Anemia   . Anxiety   . Arthritis    "right knee, back, feet" (07/30/2016)  . Bleeding stomach ulcer 01/2016  . Carotid artery disease ([place])    a. Carotid US 7/16: Bilateral ICA 60-79% >> follow-up 1 year  //  b. Carotid US 7/17: 93-26% RICA; 71-24% LICA >> Follow-up 1 year  . Chronic back pain   . Chronic lower back pain   . Diabetic peripheral neuropathy ([place])   . DVT (deep venous thrombosis) ([place])    bilateral diagnosed 01/2017  . GERD (gastroesophageal reflux disease)   . Gout   . Headache    "weekly" (07/30/2016)  . Heart murmur   . History of blood transfusion    "related to stomach bleeding"  . History of echocardiogram    a. Echo 3/17 mild concentric LVH, vigorous LVF, EF 65-70%, normal wall  motion, grade 1 diastolic dysfunction, mild TR, PASP 31 mmHg  . History of nuclear stress test    a. Myoview 6/15: Low risk, no scar or ischemia, EF 72%  //  b. Myoview 7/17: EF 82%, no scar or ischemia, low risk  . Hyperlipidemia   . Hypertension   . PAD (peripheral artery disease) ([place])    a. ABIs 7/17: R 0.73, L 0.64, waveforms suggest bilateral SFA disease b. 07/30/16 balloon angioplasty to left SFA with atherectomy   . Type II diabetes mellitus ([facility])      Family History  Problem Relation Age of Onset  . Diabetes Mother   . Heart disease Mother   . Kidney failure Mother   . Diabetes Sister      Social History   Social History  . Marital status: Single    Spouse name: N/A  . Number of children: 5  . Years of education: N/A   Occupational History  . Disabled    Social History Main Topics  . Smoking status: Former Smoker    Packs/day: 0.50    Years: 40.00    Types: Cigarettes    Quit date: 05/10/2016  . Smokeless tobacco: Former Systems developer    Types: Snuff    Quit date: 11/24/1972  . Alcohol use No     Comment: 07/30/2016 "NO ALCOHOL IN THE LAST 3 YEARS"  . Drug use: No  . Sexual  activity: No   Other Topics Concern  . Not on file   Social History Narrative  . No narrative on file     No Known Allergies   Outpatient Medications Prior to Visit  Medication Sig Dispense Refill  . amitriptyline (ELAVIL) 25 MG tablet Take 50-75 mg by mouth at bedtime.   5  . amLODipine (NORVASC) 10 MG tablet Take 10 mg by mouth daily with supper.     . ARTIFICIAL TEAR OP Place 2 drops into both eyes as needed (for dry eyes).    . cloNIDine (CATAPRES) 0.2 MG tablet Take 0.2 mg by mouth 3 (three) times daily.     . ferrous sulfate 325 (65 FE) MG tablet Take 325 mg by mouth 2 (two) times daily with a meal.    . gabapentin (NEURONTIN) 600 MG tablet TAKE 1/2 TABLET BY MOUTH THREE TIMES DAILY 90 tablet 0  . glucose blood test strip Use as instructed 100 each 12  . HUMALOG KWIKPEN 100 UNIT/ML  KiwkPen Inject 3-5 Units into the skin 2 (two) times daily as needed (for high blood sugar). Per sliding scale  11  . Insulin Glargine (LANTUS SOLOSTAR) 100 UNIT/ML Solostar Pen Inject 18 Units into the skin 2 (two) times daily. Per sliding scale (Patient taking differently: Inject 20 Units into the skin 2 (two) times daily. Per sliding scale)    . isosorbide mononitrate (IMDUR) 30 MG 24 hr tablet Take 1 tablet (30 mg total) by mouth daily. (Patient taking differently: Take 30 mg by mouth every evening. ) 30 tablet 11  . nitroGLYCERIN (NITROSTAT) 0.4 MG SL tablet Place 1 tablet (0.4 mg total) under the tongue every 5 (five) minutes as needed for chest pain. 25 tablet 3  . oxyCODONE-acetaminophen (PERCOCET) 10-325 MG tablet Take 1 tablet by mouth every 6 (six) hours as needed for pain.     . pantoprazole (PROTONIX) 40 MG tablet TAKE 1 TABLET BY MOUTH TWICE DAILY BEFORE A MEAL (Patient taking differently: Take 40 mg by mouth at night) 60 tablet 5  . rivaroxaban (XARELTO) 20 MG TABS tablet Take 1 tablet (20 mg total) by mouth daily with supper. 30 tablet 3  . rosuvastatin (CRESTOR) 40 MG tablet Take 1 tablet (40 mg total) by mouth daily. (Patient taking differently: Take 40 mg by mouth every evening. ) 90 tablet 3   No facility-administered medications prior to visit.          Objective:   Physical Exam Vitals:   03/17/17 1026  BP: 128/60  Pulse: 90  SpO2: 98%  Weight: 146 lb 6.4 oz (66.4 kg)  Height: 5\' 3"  (1.6 m)   Gen: Pleasant, well-nourished, in no distress,  normal affect  ENT: No lesions,  mouth clear,  oropharynx clear, no postnasal drip  Neck: No JVD, no stridor  Lungs: No use of accessory muscles, clear without rales or rhonchi  Cardiovascular: RRR, heart sounds normal, no murmur or gallops, no peripheral edema  Musculoskeletal: No deformities, no cyanosis or clubbing  Neuro: alert, non focal  Skin: Warm, no lesions or rashes  02/02/17 --  COMPARISON:  CT scan January 02, 2017  FINDINGS: NECK  No hypermetabolic lymph nodes in the neck.  CHEST  The masslike opacity in the right upper lobe persists but is much smaller when compared January 02, 2017. There is a associated cavitation which persists as well as air bronchograms. The maximum SUV within this mass is 6.3. The remainder of the right upper lobe  opacity seen on the previous study has resolved and there is no evidence of lymphangitic spread of tumor. No other abnormalities are seen within the lungs. The prominent lymph nodes seen on the comparison CT scan are smaller in the interval. No FDG avid nodes are identified to suggest metastatic disease.  ABDOMEN/PELVIS  There is a small rounded region of increased uptake in the liver on image 71 with a maximum SUV of 5.4. A second smaller focus is seen on image 73 in the left hepatic lobe with a max SUV of 4.8 No other discrete regions of abnormal uptake seen in the liver. No other abnormal uptake is seen within the abdomen or pelvis.  SKELETON  No focal hypermetabolic activity to suggest skeletal metastasis.  IMPRESSION: 1. The masslike opacity with air bronchograms and cavitation in the right upper lobe is much smaller in the interval but does persist. Based on the lung findings alone, the abnormality could represent incompletely resolved infection but an underlying malignancy is not excluded. However, the 2 regions of increased uptake in the liver raise the possibility of metastatic disease. Recommend an MRI of the liver. If there is evidence of metastatic disease on the MRI, recommend tissue sampling of the right upper lobe abnormality. If there is no evidence of metastatic disease in the liver on MRI, the right upper lobe abnormality could be further evaluated with a short-term follow-up CT scan in 1-2 months to assess for further improvement versus more immediate tissue sampling.       Assessment & Plan:  Mass of upper  lobe of right lung Smaller on serial scans, entire clinical picture most consistent with a right upper lobe pneumonia, needs a repeat scan to look for interval change in 6 months. We will arrange for this.  Cough Based on history sounds like this is due to breakthrough GERD. She is taking Protonix twice a day currently.  History of tobacco use We will perform pulmonary function testing. Advised complete cessation.   Baltazar Apo, MD, PhD 03/17/2017, 11:10 AM [facility] [phone] or if no answer [phone]

## 2017-03-17 NOTE — Assessment & Plan Note (Signed)
Based on history sounds like this is due to breakthrough GERD. She is taking Protonix twice a day currently.

## 2017-03-17 NOTE — Assessment & Plan Note (Signed)
We will perform pulmonary function testing. Advised complete cessation.

## 2017-03-17 NOTE — Assessment & Plan Note (Signed)
Smaller on serial scans, entire clinical picture most consistent with a right upper lobe pneumonia, needs a repeat scan to look for interval change in 6 months. We will arrange for this.

## 2017-03-26 ENCOUNTER — Ambulatory Visit: Payer: Medicare Other | Admitting: Emergency Medicine

## 2017-04-15 NOTE — Progress Notes (Signed)
Alejandra Hernandez is a 61 year old with complex PMH significant for CAD, PAD, Hx of PE and recent DVT on Xarelto, uncontrolled T2DM with poor medication compliance, HTN, HLD, and a mass of upper lobe of right lung.  Patient presents today for management of chronic illnesses   HYPERTENSION Disease Monitoring: Has a machine at home Blood pressure range - normally 703-500 systolic at home  Chest pain, palpitations - chronic CP, stable Dyspnea- chronic dyspnea, stable Medications: Norvasc 10 mg, Clonidine TID, Imdur 30 mg Compliance- 100% reported compliance  Lightheadedness,Syncope-  none  Edema - none  DIABETES Disease Monitoring: HbA1c improved at 11.7 from >15.5 Polyuria/phagia/dipsia- yes       Visual problems- was in hospital during last appointment but reports following with ophtho - every 6 months Medications: Lantus 30u BID, Novolog sliding scale prescribed by nephrology  Compliance- 100% reported compliance  Hypoglycemic symptoms- endorses feeling hypoglycemic symptoms, checked at [facility]  HYPERLIPIDEMIA Disease Monitoring:  See symptoms for Hypertension Medications: Crestor 40 mg QD Compliance- none  Right upper quadrant pain- none   Muscle aches- none  Chronic skin changes - endorses chronic darkening of her skin bilateral feet - no skin break down or ulcers - no associated discomfort, just displeased with appearance   Monitoring Labs and Parameters Last A1C:  Lab Results  Component Value Date   HGBA1C 11.7 04/17/2017    Last Lipid:     Component Value Date/Time   CHOL 265 (H) 04/20/2016 0100   HDL 85 04/20/2016 0100    Last Bmet  Potassium  Date Value Ref Range Status  02/09/2017 3.9 3.5 - 5.2 mmol/L Final   Sodium  Date Value Ref Range Status  02/09/2017 134 134 - 144 mmol/L Final   Creat  Date Value Ref Range Status  05/22/2016 1.27 (H) 0.50 - 1.05 mg/dL Final    Comment:      For patients > or = 61 years of age: The upper reference limit  for Creatinine is approximately 13% higher for people identified as African-American.      Creatinine, Ser  Date Value Ref Range Status  02/09/2017 1.62 (H) 0.57 - 1.00 mg/dL Final      Last BPs:  BP Readings from Last 3 Encounters:  04/17/17 138/60  03/17/17 128/60  03/10/17 130/70    Objective: BP 138/60   Pulse 82   Temp 98.7 F (37.1 C) (Oral)   Ht 5\' 3"  (1.6 m)   Wt 136 lb (61.7 kg)   SpO2 96%   BMI 24.09 kg/m  GEN: NAD, alert, cooperative, and pleasant. RESPIRATORY: clear to auscultation bilaterally with no wheezes, rhonchi or rales, good effort CV: RRR, no m/r/g, no peripheral edema GI: soft, non-tender, non-distended, no hepatosplenomegaly SKIN: warm and dry, no rashes or lesions Foot exam: no breakdown of skin bil feet, sensation intact bilaterally PSYCH: AAOx3, appropriate affect  Assessment and plan:  Uncontrolled type 2 diabetes mellitus with diabetic peripheral angiopathy without gangrene, with long-term current use of insulin (HCC) - A1c improving - current regimen with lantus 30mg  BID and novolog sliding scale each afternoon prescribed by nephrology per patient - 100% reported compliance - continue current regimen - referral to dietetics, patient interested in improving diet  Hyperlipidemia - ordered fasting lipid panel to be done as lab visit for disease monitoring/medication compliance - continue high intensity statin  Hypertension - currently controlled - continue current regimen  Unspecified skin changes - consistent with skin changes due to vascular disease -  recommend continuing to work on diet, exercise, diabetes and HLD control  - patient endorses following with vascular - eucerin, cetaphil for dry skin   Orders Placed This Encounter  Procedures  . Lipid panel    Standing Status:   Future    Standing Expiration Date:   04/17/2018  . Amb ref to Medical Nutrition Therapy-MNT    Referral Priority:   Routine    Referral Type:    Consultation    Referral Reason:   Specialty Services Required    Requested Specialty:   Nutrition    Number of Visits Requested:   1  . HgB A1c    No orders of the defined types were placed in this encounter.    Everrett Coombe, MD,MS,  PGY1 04/18/2017 2:50 PM

## 2017-04-17 ENCOUNTER — Encounter: Payer: Self-pay | Admitting: Student in an Organized Health Care Education/Training Program

## 2017-04-17 ENCOUNTER — Ambulatory Visit (INDEPENDENT_AMBULATORY_CARE_PROVIDER_SITE_OTHER): Payer: Medicare Other | Admitting: Student in an Organized Health Care Education/Training Program

## 2017-04-17 VITALS — BP 138/60 | HR 82 | Temp 98.7°F | Ht 63.0 in | Wt 136.0 lb

## 2017-04-17 DIAGNOSIS — E118 Type 2 diabetes mellitus with unspecified complications: Secondary | ICD-10-CM | POA: Diagnosis not present

## 2017-04-17 DIAGNOSIS — Z794 Long term (current) use of insulin: Secondary | ICD-10-CM

## 2017-04-17 DIAGNOSIS — E785 Hyperlipidemia, unspecified: Secondary | ICD-10-CM | POA: Diagnosis not present

## 2017-04-17 DIAGNOSIS — E1165 Type 2 diabetes mellitus with hyperglycemia: Secondary | ICD-10-CM | POA: Diagnosis not present

## 2017-04-17 DIAGNOSIS — E1151 Type 2 diabetes mellitus with diabetic peripheral angiopathy without gangrene: Secondary | ICD-10-CM

## 2017-04-17 DIAGNOSIS — I10 Essential (primary) hypertension: Secondary | ICD-10-CM

## 2017-04-17 DIAGNOSIS — R239 Unspecified skin changes: Secondary | ICD-10-CM | POA: Diagnosis not present

## 2017-04-17 DIAGNOSIS — IMO0002 Reserved for concepts with insufficient information to code with codable children: Secondary | ICD-10-CM

## 2017-04-17 LAB — POCT GLYCOSYLATED HEMOGLOBIN (HGB A1C): Hemoglobin A1C: 11.7

## 2017-04-17 NOTE — Patient Instructions (Addendum)
It was a pleasure seeing you today in our clinic. Today we discussed your diabetes, blood pressure and cholesterol. Here is the treatment plan we have discussed and agreed upon together:  Nutrition - I attached the business card for Dr. Jenne Campus, you can call and ask to make an appointment for a nutrition and dietetics appointment.  Cholesterol - We will check your cholesterol the next time you are in the office. You can schedule a lab visit and be sure to be fasting for at least 8 hours before coming in for this test.   Blood Pressure - Your blood pressure is well controlled this visit. We will keep your medications the same.  Diabetes For your diabetes, great work on taking your medications appropriately.  Your HbA1c is 11.7 today which is improved, but you still have a ways to go.  Great work so far! Please continue taking your insulins as prescribed. I will let the kidney doctor continue to manage your insulin so that we do not have too many cooks in the kitchen!  Our clinic's number is [phone]. Please call with questions or concerns about what we discussed today.  Be well, Dr. Burr Medico

## 2017-04-18 ENCOUNTER — Encounter: Payer: Self-pay | Admitting: Student in an Organized Health Care Education/Training Program

## 2017-04-18 DIAGNOSIS — R239 Unspecified skin changes: Secondary | ICD-10-CM | POA: Insufficient documentation

## 2017-04-18 NOTE — Assessment & Plan Note (Signed)
-   ordered fasting lipid panel to be done as lab visit for disease monitoring/medication compliance - continue high intensity statin

## 2017-04-18 NOTE — Assessment & Plan Note (Signed)
-   consistent with skin changes due to vascular disease - recommend continuing to work on diet, exercise, diabetes and HLD control  - patient endorses following with vascular - eucerin, cetaphil for dry skin

## 2017-04-18 NOTE — Assessment & Plan Note (Addendum)
-   A1c improving - current regimen with lantus 30mg  BID and novolog sliding scale each afternoon prescribed by nephrology per patient - 100% reported compliance - continue current regimen - referral to dietetics, patient interested in improving diet

## 2017-04-18 NOTE — Assessment & Plan Note (Signed)
-   currently controlled - continue current regimen

## 2017-04-22 ENCOUNTER — Other Ambulatory Visit: Payer: Self-pay | Admitting: Family Medicine

## 2017-04-22 DIAGNOSIS — I10 Essential (primary) hypertension: Secondary | ICD-10-CM

## 2017-04-24 NOTE — Addendum Note (Signed)
Addendum  created 04/24/17 1250 by Brucha Ahlquist, MD   Sign clinical note    

## 2017-04-24 NOTE — Addendum Note (Signed)
Addendum  created 04/24/17 1020 by Effie Berkshire, MD   Sign clinical note

## 2017-05-05 ENCOUNTER — Encounter: Payer: Self-pay | Admitting: Gastroenterology

## 2017-05-05 ENCOUNTER — Ambulatory Visit (INDEPENDENT_AMBULATORY_CARE_PROVIDER_SITE_OTHER): Payer: Medicare Other | Admitting: Gastroenterology

## 2017-05-05 ENCOUNTER — Other Ambulatory Visit (INDEPENDENT_AMBULATORY_CARE_PROVIDER_SITE_OTHER): Payer: Medicare Other

## 2017-05-05 VITALS — BP 132/60 | HR 88 | Ht 63.0 in | Wt 141.0 lb

## 2017-05-05 DIAGNOSIS — Z86010 Personal history of colonic polyps: Secondary | ICD-10-CM | POA: Diagnosis not present

## 2017-05-05 DIAGNOSIS — K769 Liver disease, unspecified: Secondary | ICD-10-CM | POA: Diagnosis not present

## 2017-05-05 LAB — CBC WITH DIFFERENTIAL/PLATELET
Basophils Absolute: 0 10*3/uL (ref 0.0–0.1)
Basophils Relative: 0.3 % (ref 0.0–3.0)
EOS ABS: 0.1 10*3/uL (ref 0.0–0.7)
EOS PCT: 1.4 % (ref 0.0–5.0)
HCT: 34.6 % — ABNORMAL LOW (ref 36.0–46.0)
Hemoglobin: 10.6 g/dL — ABNORMAL LOW (ref 12.0–15.0)
LYMPHS ABS: 2.4 10*3/uL (ref 0.7–4.0)
Lymphocytes Relative: 27.5 % (ref 12.0–46.0)
MCHC: 30.7 g/dL (ref 30.0–36.0)
MCV: 72 fl — ABNORMAL LOW (ref 78.0–100.0)
MONO ABS: 0.5 10*3/uL (ref 0.1–1.0)
Monocytes Relative: 5.9 % (ref 3.0–12.0)
NEUTROS PCT: 64.9 % (ref 43.0–77.0)
Neutro Abs: 5.7 10*3/uL (ref 1.4–7.7)
Platelets: 250 10*3/uL (ref 150.0–400.0)
RBC: 4.81 Mil/uL (ref 3.87–5.11)
RDW: 14.9 % (ref 11.5–15.5)
WBC: 8.8 10*3/uL (ref 4.0–10.5)

## 2017-05-05 LAB — COMPREHENSIVE METABOLIC PANEL
ALT: 15 U/L (ref 0–35)
AST: 14 U/L (ref 0–37)
Albumin: 3.6 g/dL (ref 3.5–5.2)
Alkaline Phosphatase: 102 U/L (ref 39–117)
BUN: 9 mg/dL (ref 6–23)
CHLORIDE: 102 meq/L (ref 96–112)
CO2: 28 mEq/L (ref 19–32)
Calcium: 9.4 mg/dL (ref 8.4–10.5)
Creatinine, Ser: 1.33 mg/dL — ABNORMAL HIGH (ref 0.40–1.20)
GFR: 52.24 mL/min — ABNORMAL LOW (ref >60.00)
GLUCOSE: 187 mg/dL — AB (ref 70–99)
POTASSIUM: 4 meq/L (ref 3.5–5.1)
SODIUM: 137 meq/L (ref 135–145)
Total Bilirubin: 0.2 mg/dL (ref 0.2–1.2)
Total Protein: 7.2 g/dL (ref 6.0–8.3)

## 2017-05-05 LAB — PROTIME-INR
INR: 1.5 ratio — ABNORMAL HIGH (ref 0.8–1.0)
Prothrombin Time: 16 s — ABNORMAL HIGH (ref 9.6–13.1)

## 2017-05-05 NOTE — Patient Instructions (Addendum)
You will have labs checked today in the basement lab.  Please head down after you check out with the front desk  ( cbc, cmet, inr).  Will add abd/pelvis CT scan with IV and oral contrast to the already ordered chest CT being done in this October.  Follow up shortly after the above CT scan is done in October.  Normal BMI (Body Mass Index- based on height and weight) is between 19 and 25. Your BMI today is Body mass index is 24.98 kg/m. Marland Kitchen Please consider follow up  regarding your BMI with your Primary Care Provider.
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
## 2017-05-12 ENCOUNTER — Other Ambulatory Visit: Payer: Self-pay | Admitting: Student in an Organized Health Care Education/Training Program

## 2017-05-13 ENCOUNTER — Telehealth: Payer: Self-pay

## 2017-05-13 DIAGNOSIS — K769 Liver disease, unspecified: Secondary | ICD-10-CM

## 2017-05-13 NOTE — Telephone Encounter (Signed)
Order has been placed. Nothing further was needed. 

## 2017-05-13 NOTE — Telephone Encounter (Signed)
Jeoffrey Massed, RN  Jannette Spanner, CMA        Hi,  Dr Ardis Hughs would like to add a CT abd/pelvis for a liver lesion follow up to the CT chest Dr Lamonte Sakai is scheduling in October. Can you add that to the CT chest when you order it for the pt. Thanks    Dr. Lamonte Sakai is this ok to order?

## 2017-05-13 NOTE — Telephone Encounter (Signed)
Yes, this is Ok to order. Thanks.

## 2017-05-14 ENCOUNTER — Other Ambulatory Visit: Payer: Self-pay | Admitting: Cardiology

## 2017-05-14 ENCOUNTER — Encounter: Payer: Self-pay | Admitting: Cardiology

## 2017-05-14 ENCOUNTER — Ambulatory Visit (INDEPENDENT_AMBULATORY_CARE_PROVIDER_SITE_OTHER): Payer: Medicare Other | Admitting: Cardiology

## 2017-05-14 VITALS — BP 140/70 | HR 90 | Ht 63.0 in | Wt 136.4 lb

## 2017-05-14 DIAGNOSIS — I739 Peripheral vascular disease, unspecified: Secondary | ICD-10-CM | POA: Diagnosis not present

## 2017-05-14 DIAGNOSIS — I824Z9 Acute embolism and thrombosis of unspecified deep veins of unspecified distal lower extremity: Secondary | ICD-10-CM

## 2017-05-14 DIAGNOSIS — I2510 Atherosclerotic heart disease of native coronary artery without angina pectoris: Secondary | ICD-10-CM

## 2017-05-14 DIAGNOSIS — I779 Disorder of arteries and arterioles, unspecified: Secondary | ICD-10-CM

## 2017-05-14 DIAGNOSIS — I10 Essential (primary) hypertension: Secondary | ICD-10-CM

## 2017-05-14 DIAGNOSIS — R002 Palpitations: Secondary | ICD-10-CM | POA: Diagnosis not present

## 2017-05-14 DIAGNOSIS — Z01812 Encounter for preprocedural laboratory examination: Secondary | ICD-10-CM

## 2017-05-14 DIAGNOSIS — R079 Chest pain, unspecified: Secondary | ICD-10-CM

## 2017-05-14 DIAGNOSIS — E7800 Pure hypercholesterolemia, unspecified: Secondary | ICD-10-CM

## 2017-05-14 LAB — CBC WITH DIFFERENTIAL/PLATELET
BASOS ABS: 0 10*3/uL (ref 0.0–0.2)
Basos: 0 %
EOS (ABSOLUTE): 0.2 10*3/uL (ref 0.0–0.4)
EOS: 3 %
HEMOGLOBIN: 12.1 g/dL (ref 11.1–15.9)
Hematocrit: 38.4 % (ref 34.0–46.6)
IMMATURE GRANS (ABS): 0 10*3/uL (ref 0.0–0.1)
IMMATURE GRANULOCYTES: 0 %
LYMPHS: 44 %
Lymphocytes Absolute: 3.5 10*3/uL — ABNORMAL HIGH (ref 0.7–3.1)
MCH: 22.6 pg — ABNORMAL LOW (ref 26.6–33.0)
MCHC: 31.5 g/dL (ref 31.5–35.7)
MCV: 72 fL — ABNORMAL LOW (ref 79–97)
MONOCYTES: 7 %
Monocytes Absolute: 0.5 10*3/uL (ref 0.1–0.9)
Neutrophils Absolute: 3.6 10*3/uL (ref 1.4–7.0)
Neutrophils: 46 %
Platelets: 448 10*3/uL — ABNORMAL HIGH (ref 150–379)
RBC: 5.36 x10E6/uL — AB (ref 3.77–5.28)
RDW: 15 % (ref 12.3–15.4)
WBC: 7.9 10*3/uL (ref 3.4–10.8)

## 2017-05-14 LAB — LIPID PANEL
CHOL/HDL RATIO: 3.9 ratio (ref 0.0–4.4)
Cholesterol, Total: 147 mg/dL (ref 100–199)
HDL: 38 mg/dL — ABNORMAL LOW (ref >39)
LDL CALC: 69 mg/dL (ref 0–99)
TRIGLYCERIDES: 199 mg/dL — AB (ref 0–149)
VLDL Cholesterol Cal: 40 mg/dL (ref 5–40)

## 2017-05-14 LAB — PROTIME-INR
INR: 1.4 — AB (ref 0.8–1.2)
PROTHROMBIN TIME: 14.6 s — AB (ref 9.1–12.0)

## 2017-05-14 LAB — HEPATIC FUNCTION PANEL
ALBUMIN: 4.2 g/dL (ref 3.6–4.8)
ALK PHOS: 140 IU/L — AB (ref 39–117)
ALT: 17 IU/L (ref 0–32)
AST: 19 IU/L (ref 0–40)
BILIRUBIN, DIRECT: 0.08 mg/dL (ref 0.00–0.40)
Bilirubin Total: <0.2 mg/dL (ref 0.0–1.2)
TOTAL PROTEIN: 7.5 g/dL (ref 6.0–8.5)

## 2017-05-14 LAB — BASIC METABOLIC PANEL
BUN/Creatinine Ratio: 6 — ABNORMAL LOW (ref 12–28)
BUN: 7 mg/dL — AB (ref 8–27)
CALCIUM: 9.5 mg/dL (ref 8.7–10.3)
CHLORIDE: 96 mmol/L (ref 96–106)
CO2: 18 mmol/L — ABNORMAL LOW (ref 20–29)
CREATININE: 1.27 mg/dL — AB (ref 0.57–1.00)
GFR calc Af Amer: 53 mL/min/{1.73_m2} — ABNORMAL LOW (ref >59)
GFR calc non Af Amer: 46 mL/min/{1.73_m2} — ABNORMAL LOW (ref >59)
GLUCOSE: 321 mg/dL — AB (ref 65–99)
Potassium: 4 mmol/L (ref 3.5–5.2)
Sodium: 134 mmol/L (ref 134–144)

## 2017-05-14 LAB — APTT: APTT: 46 s — AB (ref 24–33)

## 2017-05-14 NOTE — Patient Instructions (Addendum)
Medication Instructions:  Your physician recommends that you continue on your current medications as directed. Please refer to the Current Medication list given to you today.   Labwork: TODAY: BMET, CBC, PT, INR, LFTs, Lipids  Testing/Procedures: Your physician has recommended that you wear an event monitor. Event monitors are medical devices that record the heart's electrical activity. Doctors most often [place] these monitors to diagnose arrhythmias. Arrhythmias are problems with the speed or rhythm of the heartbeat. The monitor is a small, portable device. You can wear one while you do your normal daily activities. This is usually used to diagnose what is causing palpitations/syncope (passing out).   Dr. Radford Pax recommends you have a HEART CATH. Instructions are below.  Follow-Up: Your physician wants you to follow-up in: 6 months with Dr. Radford Pax. You will receive a reminder letter in the mail two months in advance. If you don't receive a letter, please call our office to schedule the follow-up appointment.   Any Other Special Instructions Will Be Listed Below (If Applicable).   [facility] [address], [place] [address] [place] [address] Dept: [phone] Loc: [place]  05/14/2017  You are scheduled for a Cardiac Catheterization on Monday, June 25 with Dr. Larae Grooms.  1. Please arrive at the [facility] (Main Entrance [facility] at [facility]: [address] [place], [place] [address] at 5:30 AM (two hours before your procedure to ensure your preparation). Free valet parking service is available.   Special note: Every effort is made to have your procedure done on time. Please understand that emergencies sometimes delay scheduled procedures.  2. Diet: Do not eat or drink anything after midnight prior to your procedure except sips of water to take medications.  3. Labs: None  needed.  4. Medication instructions in preparation for your procedure:  1) Your last dose of Xarelto should be Friday, 6/22.  2) HOLD LANTUS AND HUMALOG the AM of your test.  5. Plan for one night stay--bring personal belongings. 6. Bring a current list of your medications and current insurance cards. 7. You MUST have a responsible person to drive you home. 8. Someone MUST be with you the first 24 hours after you arrive home or your discharge will be delayed. 9. Please wear clothes that are easy to get on and off and wear slip-on shoes.  Thank you for allowing [place] to care for you!   -- [facility]     If you need a refill on your cardiac medications before your next appointment, please call your pharmacy.
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
## 2017-05-15 ENCOUNTER — Telehealth: Payer: Self-pay

## 2017-05-15 NOTE — Telephone Encounter (Signed)
Patient contacted pre-catheterization at [facility] scheduled for: 05/18/2017 @ 0730  Confirmed AM meds to be taken pre-cath with sip of water:  Notified Pt to hold Xarelto one day before cath (last dose Saturday) per Dr. Radford Pax note and discussion with pharmacist.  Notified Pt to take ASA morning of procedure.  Pt asked about her insulins.  Per Pt she takes a long acting and short acting insulin.  Notified Pt that our policy states to take 1/2 amount of long acting insulin night before procedure and hold all insulins on day of procedure prior to procedure.  Pt indicates understanding.    Confirmed patient has responsible person to drive home post procedure and observe patient for 24 hours:  Yes  Addl concerns:  Pt has h/o of anemia and CKD.  Last Cr 1.27 on 05/14/2017

## 2017-05-18 ENCOUNTER — Other Ambulatory Visit: Payer: Medicare Other

## 2017-05-18 ENCOUNTER — Ambulatory Visit (HOSPITAL_COMMUNITY)
Admission: RE | Admit: 2017-05-18 | Discharge: 2017-05-20 | Disposition: A | Discharge status: Home / Self Care | Payer: Medicare Other | Source: Ambulatory Visit | Attending: Interventional Cardiology | Admitting: Interventional Cardiology

## 2017-05-18 ENCOUNTER — Ambulatory Visit (HOSPITAL_COMMUNITY)
Admission: RE | Disposition: A | Discharge status: Home / Self Care | Payer: Self-pay | Source: Ambulatory Visit | Attending: Interventional Cardiology

## 2017-05-18 ENCOUNTER — Encounter (HOSPITAL_COMMUNITY): Payer: Self-pay | Admitting: Interventional Cardiology

## 2017-05-18 DIAGNOSIS — K219 Gastro-esophageal reflux disease without esophagitis: Secondary | ICD-10-CM | POA: Insufficient documentation

## 2017-05-18 DIAGNOSIS — Z794 Long term (current) use of insulin: Secondary | ICD-10-CM | POA: Diagnosis not present

## 2017-05-18 DIAGNOSIS — M549 Dorsalgia, unspecified: Secondary | ICD-10-CM | POA: Diagnosis not present

## 2017-05-18 DIAGNOSIS — E1142 Type 2 diabetes mellitus with diabetic polyneuropathy: Secondary | ICD-10-CM | POA: Diagnosis not present

## 2017-05-18 DIAGNOSIS — D631 Anemia in chronic kidney disease: Secondary | ICD-10-CM | POA: Insufficient documentation

## 2017-05-18 DIAGNOSIS — F17210 Nicotine dependence, cigarettes, uncomplicated: Secondary | ICD-10-CM | POA: Insufficient documentation

## 2017-05-18 DIAGNOSIS — Z7901 Long term (current) use of anticoagulants: Secondary | ICD-10-CM | POA: Insufficient documentation

## 2017-05-18 DIAGNOSIS — I82403 Acute embolism and thrombosis of unspecified deep veins of lower extremity, bilateral: Secondary | ICD-10-CM | POA: Diagnosis not present

## 2017-05-18 DIAGNOSIS — I129 Hypertensive chronic kidney disease with stage 1 through stage 4 chronic kidney disease, or unspecified chronic kidney disease: Secondary | ICD-10-CM | POA: Diagnosis not present

## 2017-05-18 DIAGNOSIS — K224 Dyskinesia of esophagus: Secondary | ICD-10-CM | POA: Diagnosis not present

## 2017-05-18 DIAGNOSIS — I209 Angina pectoris, unspecified: Secondary | ICD-10-CM | POA: Diagnosis present

## 2017-05-18 DIAGNOSIS — F419 Anxiety disorder, unspecified: Secondary | ICD-10-CM | POA: Diagnosis not present

## 2017-05-18 DIAGNOSIS — I6523 Occlusion and stenosis of bilateral carotid arteries: Secondary | ICD-10-CM | POA: Insufficient documentation

## 2017-05-18 DIAGNOSIS — Z955 Presence of coronary angioplasty implant and graft: Secondary | ICD-10-CM

## 2017-05-18 DIAGNOSIS — Z8719 Personal history of other diseases of the digestive system: Secondary | ICD-10-CM | POA: Insufficient documentation

## 2017-05-18 DIAGNOSIS — E1122 Type 2 diabetes mellitus with diabetic chronic kidney disease: Secondary | ICD-10-CM | POA: Insufficient documentation

## 2017-05-18 DIAGNOSIS — I25119 Atherosclerotic heart disease of native coronary artery with unspecified angina pectoris: Secondary | ICD-10-CM | POA: Insufficient documentation

## 2017-05-18 DIAGNOSIS — E7800 Pure hypercholesterolemia, unspecified: Secondary | ICD-10-CM | POA: Diagnosis not present

## 2017-05-18 DIAGNOSIS — E1151 Type 2 diabetes mellitus with diabetic peripheral angiopathy without gangrene: Secondary | ICD-10-CM | POA: Insufficient documentation

## 2017-05-18 DIAGNOSIS — I9788 Other intraoperative complications of the circulatory system, not elsewhere classified: Secondary | ICD-10-CM | POA: Insufficient documentation

## 2017-05-18 DIAGNOSIS — E785 Hyperlipidemia, unspecified: Secondary | ICD-10-CM | POA: Diagnosis present

## 2017-05-18 DIAGNOSIS — M1990 Unspecified osteoarthritis, unspecified site: Secondary | ICD-10-CM | POA: Insufficient documentation

## 2017-05-18 DIAGNOSIS — N179 Acute kidney failure, unspecified: Secondary | ICD-10-CM | POA: Diagnosis not present

## 2017-05-18 DIAGNOSIS — G8929 Other chronic pain: Secondary | ICD-10-CM | POA: Insufficient documentation

## 2017-05-18 DIAGNOSIS — Y840 Cardiac catheterization as the cause of abnormal reaction of the patient, or of later complication, without mention of misadventure at the time of the procedure: Secondary | ICD-10-CM | POA: Diagnosis not present

## 2017-05-18 DIAGNOSIS — R079 Chest pain, unspecified: Secondary | ICD-10-CM

## 2017-05-18 DIAGNOSIS — N189 Chronic kidney disease, unspecified: Secondary | ICD-10-CM | POA: Insufficient documentation

## 2017-05-18 DIAGNOSIS — I10 Essential (primary) hypertension: Secondary | ICD-10-CM | POA: Diagnosis present

## 2017-05-18 DIAGNOSIS — M109 Gout, unspecified: Secondary | ICD-10-CM | POA: Diagnosis not present

## 2017-05-18 HISTORY — DX: Atherosclerotic heart disease of native coronary artery without angina pectoris: I25.10

## 2017-05-18 HISTORY — PX: LEFT HEART CATH AND CORONARY ANGIOGRAPHY: CATH118249

## 2017-05-18 LAB — APTT
APTT: 34 s (ref 24–36)
aPTT: 83 seconds — ABNORMAL HIGH (ref 24–36)

## 2017-05-18 LAB — GLUCOSE, CAPILLARY
GLUCOSE-CAPILLARY: 170 mg/dL — AB (ref 65–99)
GLUCOSE-CAPILLARY: 101 mg/dL — AB (ref 65–99)
Glucose-Capillary: 183 mg/dL — ABNORMAL HIGH (ref 65–99)
Glucose-Capillary: 152 mg/dL — ABNORMAL HIGH (ref 65–99)
Glucose-Capillary: 212 mg/dL — ABNORMAL HIGH (ref 65–99)

## 2017-05-18 LAB — HEPARIN LEVEL (UNFRACTIONATED): HEPARIN UNFRACTIONATED: 0.61 [IU]/mL (ref 0.30–0.70)

## 2017-05-18 SURGERY — LEFT HEART CATH AND CORONARY ANGIOGRAPHY
Anesthesia: LOCAL

## 2017-05-18 MED ORDER — ALPRAZOLAM 0.5 MG PO TABS
0.5000 mg | ORAL_TABLET | Freq: Every day | ORAL | Status: DC | PRN
  Administered 2017-05-18 – 2017-05-19 (×3): 0.5 mg via ORAL
  Filled 2017-05-18 (×3): qty 1

## 2017-05-18 MED ORDER — SODIUM CHLORIDE 0.9% FLUSH: 3.0000 mL | INTRAVENOUS | Status: DC | PRN

## 2017-05-18 MED ORDER — HEPARIN SODIUM (PORCINE) 1000 UNIT/ML IJ SOLN
INTRAMUSCULAR | Status: AC
Start: 2017-05-18 — End: ?
  Filled 2017-05-18: qty 1

## 2017-05-18 MED ORDER — FENTANYL CITRATE (PF) 100 MCG/2ML IJ SOLN
INTRAMUSCULAR | Status: AC
  Filled 2017-05-18: qty 2

## 2017-05-18 MED ORDER — AMITRIPTYLINE HCL 25 MG PO TABS
25.0000 mg | ORAL_TABLET | Freq: Every day | ORAL | Status: DC
  Administered 2017-05-18 – 2017-05-20 (×3): 25 mg via ORAL
  Filled 2017-05-18 (×3): qty 1

## 2017-05-18 MED ORDER — INSULIN ASPART 100 UNIT/ML ~~LOC~~ SOLN
0.0000 [IU] | Freq: Three times a day (TID) | SUBCUTANEOUS | Status: DC
  Administered 2017-05-18 – 2017-05-19 (×2): 5 [IU] via SUBCUTANEOUS

## 2017-05-18 MED ORDER — MIDAZOLAM HCL 2 MG/2ML IJ SOLN
INTRAMUSCULAR | Status: AC
  Filled 2017-05-18: qty 2

## 2017-05-18 MED ORDER — HEPARIN (PORCINE) IN NACL 100-0.45 UNIT/ML-% IJ SOLN
1100.0000 [IU]/h | INTRAMUSCULAR | Status: DC
  Administered 2017-05-18: 1100 [IU]/h via INTRAVENOUS
  Filled 2017-05-18: qty 250

## 2017-05-18 MED ORDER — INSULIN LISPRO 100 UNIT/ML (KWIKPEN): 2.0000 [IU] | PEN_INJECTOR | Freq: Two times a day (BID) | SUBCUTANEOUS | Status: DC | PRN

## 2017-05-18 MED ORDER — ISOSORBIDE MONONITRATE ER 30 MG PO TB24
30.0000 mg | ORAL_TABLET | Freq: Every day | ORAL | Status: DC
  Administered 2017-05-18 – 2017-05-19 (×2): 30 mg via ORAL
  Filled 2017-05-18 (×2): qty 1

## 2017-05-18 MED ORDER — ROSUVASTATIN CALCIUM 20 MG PO TABS
40.0000 mg | ORAL_TABLET | Freq: Every day | ORAL | Status: DC
  Administered 2017-05-18 – 2017-05-19 (×2): 40 mg via ORAL
  Filled 2017-05-18: qty 4
  Filled 2017-05-18: qty 2

## 2017-05-18 MED ORDER — ACETAMINOPHEN 325 MG PO TABS: 650.0000 mg | ORAL_TABLET | ORAL | Status: DC | PRN

## 2017-05-18 MED ORDER — FENTANYL CITRATE (PF) 100 MCG/2ML IJ SOLN
INTRAMUSCULAR | Status: DC | PRN
  Administered 2017-05-18: 25 ug via INTRAVENOUS

## 2017-05-18 MED ORDER — OXYCODONE HCL 5 MG PO TABS
5.0000 mg | ORAL_TABLET | Freq: Three times a day (TID) | ORAL | Status: DC | PRN
  Administered 2017-05-18 – 2017-05-19 (×3): 5 mg via ORAL
  Filled 2017-05-18 (×3): qty 1

## 2017-05-18 MED ORDER — ASPIRIN 81 MG PO CHEW: 81.0000 mg | CHEWABLE_TABLET | ORAL | Status: DC

## 2017-05-18 MED ORDER — GABAPENTIN 300 MG PO CAPS
300.0000 mg | ORAL_CAPSULE | Freq: Three times a day (TID) | ORAL | Status: DC
  Administered 2017-05-18 – 2017-05-20 (×6): 300 mg via ORAL
  Filled 2017-05-18 (×6): qty 1

## 2017-05-18 MED ORDER — SODIUM CHLORIDE 0.9 % IV SOLN: 250.0000 mL | INTRAVENOUS | Status: DC | PRN

## 2017-05-18 MED ORDER — HEPARIN (PORCINE) IN NACL 2-0.9 UNIT/ML-% IJ SOLN
INTRAMUSCULAR | Status: AC | PRN
  Administered 2017-05-18: 1000 mL

## 2017-05-18 MED ORDER — OXYCODONE-ACETAMINOPHEN 5-325 MG PO TABS
1.0000 | ORAL_TABLET | Freq: Three times a day (TID) | ORAL | Status: DC | PRN
  Administered 2017-05-18 – 2017-05-20 (×4): 1 via ORAL
  Filled 2017-05-18 (×4): qty 1

## 2017-05-18 MED ORDER — MIDAZOLAM HCL 2 MG/2ML IJ SOLN
INTRAMUSCULAR | Status: DC | PRN
  Administered 2017-05-18: 2 mg via INTRAVENOUS

## 2017-05-18 MED ORDER — AMLODIPINE BESYLATE 10 MG PO TABS
10.0000 mg | ORAL_TABLET | Freq: Every day | ORAL | Status: DC
  Administered 2017-05-18 – 2017-05-19 (×2): 10 mg via ORAL
  Filled 2017-05-18 (×2): qty 1

## 2017-05-18 MED ORDER — NITROGLYCERIN 1 MG/10 ML FOR IR/CATH LAB
INTRA_ARTERIAL | Status: AC
  Filled 2017-05-18: qty 10

## 2017-05-18 MED ORDER — NITROGLYCERIN 1 MG/10 ML FOR IR/CATH LAB
INTRA_ARTERIAL | Status: DC | PRN
  Administered 2017-05-18 (×2): 200 ug via INTRA_ARTERIAL

## 2017-05-18 MED ORDER — SODIUM CHLORIDE 0.9 % WEIGHT BASED INFUSION
3.0000 mL/kg/h | INTRAVENOUS | Status: DC
  Administered 2017-05-18: 3 mL/kg/h via INTRAVENOUS

## 2017-05-18 MED ORDER — VERAPAMIL HCL 2.5 MG/ML IV SOLN
INTRAVENOUS | Status: DC | PRN
  Administered 2017-05-18: 10 mL via INTRA_ARTERIAL

## 2017-05-18 MED ORDER — AMITRIPTYLINE HCL 25 MG PO TABS
50.0000 mg | ORAL_TABLET | Freq: Every day | ORAL | Status: DC
  Administered 2017-05-18 – 2017-05-19 (×2): 50 mg via ORAL
  Filled 2017-05-18: qty 1
  Filled 2017-05-18: qty 2

## 2017-05-18 MED ORDER — IOPAMIDOL (ISOVUE-370) INJECTION 76%
INTRAVENOUS | Status: AC
  Filled 2017-05-18: qty 100

## 2017-05-18 MED ORDER — ONDANSETRON HCL 4 MG/2ML IJ SOLN: 4.0000 mg | Freq: Four times a day (QID) | INTRAMUSCULAR | Status: DC | PRN

## 2017-05-18 MED ORDER — SODIUM CHLORIDE 0.9% FLUSH
3.0000 mL | Freq: Two times a day (BID) | INTRAVENOUS | Status: DC
  Administered 2017-05-18 – 2017-05-19 (×4): 3 mL via INTRAVENOUS

## 2017-05-18 MED ORDER — PANTOPRAZOLE SODIUM 40 MG PO TBEC
40.0000 mg | DELAYED_RELEASE_TABLET | Freq: Every day | ORAL | Status: DC
  Administered 2017-05-18 – 2017-05-19 (×2): 40 mg via ORAL
  Filled 2017-05-18 (×2): qty 1

## 2017-05-18 MED ORDER — VERAPAMIL HCL 2.5 MG/ML IV SOLN
INTRAVENOUS | Status: AC
  Filled 2017-05-18: qty 2

## 2017-05-18 MED ORDER — FERROUS SULFATE 325 (65 FE) MG PO TABS
325.0000 mg | ORAL_TABLET | Freq: Two times a day (BID) | ORAL | Status: DC
  Administered 2017-05-18 – 2017-05-20 (×3): 325 mg via ORAL
  Filled 2017-05-18 (×3): qty 1

## 2017-05-18 MED ORDER — NITROGLYCERIN 0.4 MG SL SUBL: 0.4000 mg | SUBLINGUAL_TABLET | SUBLINGUAL | Status: DC | PRN

## 2017-05-18 MED ORDER — HEPARIN (PORCINE) IN NACL 2-0.9 UNIT/ML-% IJ SOLN
INTRAMUSCULAR | Status: AC
  Filled 2017-05-18: qty 1000

## 2017-05-18 MED ORDER — SODIUM CHLORIDE 0.9% FLUSH: 3.0000 mL | Freq: Two times a day (BID) | INTRAVENOUS | Status: DC

## 2017-05-18 MED ORDER — CLONIDINE HCL 0.1 MG PO TABS
0.2000 mg | ORAL_TABLET | Freq: Three times a day (TID) | ORAL | Status: DC
  Administered 2017-05-18 – 2017-05-20 (×5): 0.2 mg via ORAL
  Filled 2017-05-18 (×2): qty 2
  Filled 2017-05-18 (×2): qty 1
  Filled 2017-05-18 (×2): qty 2

## 2017-05-18 MED ORDER — SODIUM CHLORIDE 0.9 % WEIGHT BASED INFUSION: 1.0000 mL/kg/h | INTRAVENOUS | Status: DC

## 2017-05-18 MED ORDER — OXYCODONE-ACETAMINOPHEN 10-325 MG PO TABS: 1.0000 | ORAL_TABLET | Freq: Three times a day (TID) | ORAL | Status: DC | PRN

## 2017-05-18 MED ORDER — INSULIN GLARGINE 100 UNIT/ML ~~LOC~~ SOLN
30.0000 [IU] | Freq: Two times a day (BID) | SUBCUTANEOUS | Status: DC
  Administered 2017-05-18 – 2017-05-20 (×3): 30 [IU] via SUBCUTANEOUS
  Filled 2017-05-18 (×5): qty 0.3

## 2017-05-18 MED ORDER — LIDOCAINE HCL (PF) 1 % IJ SOLN
INTRAMUSCULAR | Status: AC
  Filled 2017-05-18: qty 30

## 2017-05-18 MED ORDER — AMITRIPTYLINE HCL 50 MG PO TABS: 25.0000 mg | ORAL_TABLET | ORAL | Status: DC

## 2017-05-18 SURGICAL SUPPLY — 13 items
CATH 5FR JL3.5 JR4 ANG PIG MP (CATHETERS) ×2 IMPLANT
CATH INFINITI MULTIPACK ANG 4F (CATHETERS) ×2 IMPLANT
CATH LAUNCHER 5F EBU3.0 (CATHETERS) ×1 IMPLANT
CATHETER LAUNCHER 5F EBU3.0 (CATHETERS) ×2
DEVICE RAD COMP TR BAND LRG (VASCULAR PRODUCTS) ×2 IMPLANT
GLIDESHEATH SLEND SS 6F .021 (SHEATH) ×2 IMPLANT
GUIDEWIRE INQWIRE 1.5J.035X260 (WIRE) ×2 IMPLANT
INQWIRE 1.5J .035X260CM (WIRE) ×4
KIT HEART LEFT (KITS) ×2 IMPLANT
PACK CARDIAC CATHETERIZATION (CUSTOM PROCEDURE TRAY) ×2 IMPLANT
TRANSDUCER W/STOPCOCK (MISCELLANEOUS) ×2 IMPLANT
TUBING CIL FLEX 10 FLL-RA (TUBING) ×2 IMPLANT
WIRE HI TORQ VERSACORE-J 145CM (WIRE) ×2 IMPLANT

## 2017-05-18 NOTE — Progress Notes (Signed)
TR BAND REMOVAL  LOCATION:    Radial right  DEFLATED PER PROTOCOL:   yes  TIME BAND OFF / DRESSING APPLIED:   1040 AM   SITE UPON ARRIVAL:    Level 0  SITE AFTER BAND REMOVAL:    Level 0  CIRCULATION SENSATION AND MOVEMENT:    Within Normal Limits :yes  COMMENTS:

## 2017-05-18 NOTE — Progress Notes (Signed)
ANTICOAGULATION CONSULT NOTE  Pharmacy Consult for heparin Indication: DVT  Heparin Dosing Weight: 62.9 kg   Assessment: 66 yof admitted to re-attempt cath 6/26 through femoral acess. On Xarelto PTA for hx bilateral DVT - last dose 6/23. Pharmacy consulted to start heparin 6 hours post-sheath removal. Will dose based on aPTT while Xarelto influencing heparin levels. CBC ok on 6/21. No bleed documented.  Sheath removed at 0808 per procedure log. TR band removed at 1040.  aptt therapeutic   Goal of Therapy:  Heparin level 0.3-0.7 units/ml aPTT 66-102 seconds Monitor platelets by anticoagulation protocol: Yes   Plan:  Continue 1100 units/hr Daily heparin level/aPTT/CBC Monitor s/sx bleeding Re-attempt LHC on 6/26 - f/u resuming Xarelto post-cath  Levester Fresh, PharmD, BCPS, BCCCP Clinical Pharmacist 05/18/2017 9:21 PM

## 2017-05-18 NOTE — Interval H&P Note (Signed)
Cath Lab Visit (complete for each Cath Lab visit)  Clinical Evaluation Leading to the Procedure:   ACS: yes    Anginal Classification: CCS IV  Anti-ischemic medical therapy: Minimal Therapy (1 class of medications)  Non-Invasive Test Results: No non-invasive testing performed  Prior CABG: No previous CABG      History and Physical Interval Note:  05/18/2017 7:28 AM  Alejandra Hernandez  has presented today for surgery, with the diagnosis of cp  The various methods of treatment have been discussed with the patient and family. After consideration of risks, benefits and other options for treatment, the patient has consented to  Procedure(s): Left Heart Cath and Coronary Angiography (N/A) as a surgical intervention .  The patient's history has been reviewed, patient examined, no change in status, stable for surgery.  I have reviewed the patient's chart and labs.  Questions were answered to the patient's satisfaction.     Larae Grooms

## 2017-05-18 NOTE — Progress Notes (Signed)
ANTICOAGULATION CONSULT NOTE  Pharmacy Consult for heparin Indication: DVT  Heparin Dosing Weight: 62.9 kg   Assessment: 68 yof admitted to re-attempt cath 6/26 through femoral acess. On Xarelto PTA for hx bilateral DVT - last dose 6/23. Pharmacy consulted to start heparin 6 hours post-sheath removal. Will dose based on aPTT while Xarelto influencing heparin levels. CBC ok on 6/21. No bleed documented.  Sheath removed at 0808 per procedure log. TR band removed at 1040.  Goal of Therapy:  Heparin level 0.3-0.7 units/ml aPTT 66-102 seconds Monitor platelets by anticoagulation protocol: Yes   Plan:  Baseline aPTT/heparin level No bolus. Start heparin at 1100 units/h at 1400 (6 hours post-sheath removal) 6h aPTT Daily heparin level/aPTT/CBC Monitor s/sx bleeding Re-attempt LHC on 6/26 - f/u resuming Xarelto post-cath   Elicia Lamp, PharmD, BCPS Clinical Pharmacist Rx Phone # for today: [phone] After 3:30PM, please call Main Rx: [phone] 05/18/2017 11:10 AM
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
## 2017-05-19 ENCOUNTER — Encounter (HOSPITAL_COMMUNITY)
Admission: RE | Disposition: A | Discharge status: Home / Self Care | Payer: Self-pay | Source: Ambulatory Visit | Attending: Interventional Cardiology

## 2017-05-19 ENCOUNTER — Encounter (HOSPITAL_COMMUNITY): Payer: Self-pay | Admitting: *Deleted

## 2017-05-19 DIAGNOSIS — I82403 Acute embolism and thrombosis of unspecified deep veins of lower extremity, bilateral: Secondary | ICD-10-CM | POA: Diagnosis not present

## 2017-05-19 DIAGNOSIS — I9788 Other intraoperative complications of the circulatory system, not elsewhere classified: Secondary | ICD-10-CM | POA: Diagnosis not present

## 2017-05-19 DIAGNOSIS — I25110 Atherosclerotic heart disease of native coronary artery with unstable angina pectoris: Secondary | ICD-10-CM

## 2017-05-19 DIAGNOSIS — I209 Angina pectoris, unspecified: Secondary | ICD-10-CM

## 2017-05-19 DIAGNOSIS — I25119 Atherosclerotic heart disease of native coronary artery with unspecified angina pectoris: Secondary | ICD-10-CM | POA: Diagnosis not present

## 2017-05-19 DIAGNOSIS — E1151 Type 2 diabetes mellitus with diabetic peripheral angiopathy without gangrene: Secondary | ICD-10-CM | POA: Diagnosis not present

## 2017-05-19 HISTORY — PX: LEFT HEART CATH AND CORONARY ANGIOGRAPHY: CATH118249

## 2017-05-19 HISTORY — PX: CORONARY STENT INTERVENTION: CATH118234

## 2017-05-19 LAB — GLUCOSE, CAPILLARY
GLUCOSE-CAPILLARY: 233 mg/dL — AB (ref 65–99)
GLUCOSE-CAPILLARY: 168 mg/dL — AB (ref 65–99)
Glucose-Capillary: 96 mg/dL (ref 65–99)
Glucose-Capillary: 108 mg/dL — ABNORMAL HIGH (ref 65–99)

## 2017-05-19 LAB — CBC
HCT: 34.3 % — ABNORMAL LOW (ref 36.0–46.0)
Hemoglobin: 10.4 g/dL — ABNORMAL LOW (ref 12.0–15.0)
MCH: 21.4 pg — ABNORMAL LOW (ref 26.0–34.0)
MCHC: 30.3 g/dL (ref 30.0–36.0)
MCV: 70.4 fL — ABNORMAL LOW (ref 78.0–100.0)
PLATELETS: 521 10*3/uL — AB (ref 150–400)
RBC: 4.87 MIL/uL (ref 3.87–5.11)
RDW: 14.4 % (ref 11.5–15.5)
WBC: 10.4 10*3/uL (ref 4.0–10.5)

## 2017-05-19 LAB — HEPARIN LEVEL (UNFRACTIONATED): HEPARIN UNFRACTIONATED: 0.73 [IU]/mL — AB (ref 0.30–0.70)

## 2017-05-19 LAB — APTT: APTT: 122 s — AB (ref 24–36)

## 2017-05-19 LAB — POCT ACTIVATED CLOTTING TIME: Activated Clotting Time: 439 seconds

## 2017-05-19 SURGERY — LEFT HEART CATH AND CORONARY ANGIOGRAPHY
Anesthesia: LOCAL

## 2017-05-19 MED ORDER — CLOPIDOGREL BISULFATE 300 MG PO TABS
ORAL_TABLET | ORAL | Status: AC
Start: 2017-05-19 — End: ?
  Filled 2017-05-19: qty 2

## 2017-05-19 MED ORDER — SODIUM CHLORIDE 0.9 % WEIGHT BASED INFUSION
3.0000 mL/kg/h | INTRAVENOUS | Status: DC
  Administered 2017-05-19: 3 mL/kg/h via INTRAVENOUS

## 2017-05-19 MED ORDER — HYDRALAZINE HCL 20 MG/ML IJ SOLN
5.0000 mg | INTRAMUSCULAR | Status: DC | PRN
Start: 2017-05-19 — End: 2017-05-19

## 2017-05-19 MED ORDER — CLOPIDOGREL BISULFATE 75 MG PO TABS
75.0000 mg | ORAL_TABLET | Freq: Every day | ORAL | Status: DC
  Administered 2017-05-20: 75 mg via ORAL
  Filled 2017-05-19: qty 1

## 2017-05-19 MED ORDER — SODIUM CHLORIDE 0.9 % IV SOLN
INTRAVENOUS | Status: DC
  Administered 2017-05-19 – 2017-05-20 (×3): via INTRAVENOUS

## 2017-05-19 MED ORDER — SODIUM CHLORIDE 0.9 % IV SOLN
INTRAVENOUS | Status: AC | PRN
  Administered 2017-05-19: 1.75 mg/kg/h via INTRAVENOUS

## 2017-05-19 MED ORDER — BIVALIRUDIN BOLUS VIA INFUSION - CUPID
INTRAVENOUS | Status: DC | PRN
  Administered 2017-05-19: 47.625 mg via INTRAVENOUS

## 2017-05-19 MED ORDER — SODIUM CHLORIDE 0.9% FLUSH: 3.0000 mL | INTRAVENOUS | Status: DC | PRN

## 2017-05-19 MED ORDER — HEPARIN (PORCINE) IN NACL 2-0.9 UNIT/ML-% IJ SOLN
INTRAMUSCULAR | Status: AC | PRN
  Administered 2017-05-19: 1000 mL

## 2017-05-19 MED ORDER — LABETALOL HCL 5 MG/ML IV SOLN: 10.0000 mg | INTRAVENOUS | Status: DC | PRN

## 2017-05-19 MED ORDER — ONDANSETRON HCL 4 MG/2ML IJ SOLN: 4.0000 mg | Freq: Four times a day (QID) | INTRAMUSCULAR | Status: DC | PRN

## 2017-05-19 MED ORDER — LIDOCAINE HCL (PF) 1 % IJ SOLN
INTRAMUSCULAR | Status: AC
  Filled 2017-05-19: qty 30

## 2017-05-19 MED ORDER — IOPAMIDOL (ISOVUE-370) INJECTION 76%
INTRAVENOUS | Status: DC | PRN
  Administered 2017-05-19: 180 mL via INTRA_ARTERIAL

## 2017-05-19 MED ORDER — CLOPIDOGREL BISULFATE 300 MG PO TABS
ORAL_TABLET | ORAL | Status: DC | PRN
  Administered 2017-05-19: 600 mg via ORAL

## 2017-05-19 MED ORDER — MIDAZOLAM HCL 2 MG/2ML IJ SOLN
INTRAMUSCULAR | Status: DC | PRN
  Administered 2017-05-19 (×2): 1 mg via INTRAVENOUS

## 2017-05-19 MED ORDER — FENTANYL CITRATE (PF) 100 MCG/2ML IJ SOLN
INTRAMUSCULAR | Status: DC | PRN
  Administered 2017-05-19 (×2): 25 ug via INTRAVENOUS

## 2017-05-19 MED ORDER — ACETAMINOPHEN 325 MG PO TABS: 650.0000 mg | ORAL_TABLET | ORAL | Status: DC | PRN

## 2017-05-19 MED ORDER — SODIUM CHLORIDE 0.9 % WEIGHT BASED INFUSION
1.0000 mL/kg/h | INTRAVENOUS | Status: DC
  Administered 2017-05-19: 1 mL/kg/h via INTRAVENOUS

## 2017-05-19 MED ORDER — FENTANYL CITRATE (PF) 100 MCG/2ML IJ SOLN
INTRAMUSCULAR | Status: AC
  Filled 2017-05-19: qty 2

## 2017-05-19 MED ORDER — ASPIRIN 81 MG PO CHEW
81.0000 mg | CHEWABLE_TABLET | ORAL | Status: AC
  Administered 2017-05-19: 81 mg via ORAL
  Filled 2017-05-19: qty 1

## 2017-05-19 MED ORDER — HEPARIN (PORCINE) IN NACL 2-0.9 UNIT/ML-% IJ SOLN
INTRAMUSCULAR | Status: AC
  Filled 2017-05-19: qty 1000

## 2017-05-19 MED ORDER — BIVALIRUDIN TRIFLUOROACETATE 250 MG IV SOLR
INTRAVENOUS | Status: AC
  Filled 2017-05-19: qty 250

## 2017-05-19 MED ORDER — SODIUM CHLORIDE 0.9 % IV SOLN: 250.0000 mL | INTRAVENOUS | Status: DC | PRN

## 2017-05-19 MED ORDER — SODIUM CHLORIDE 0.9% FLUSH: 3.0000 mL | Freq: Two times a day (BID) | INTRAVENOUS | Status: DC

## 2017-05-19 MED ORDER — ANGIOPLASTY BOOK
Freq: Once | Status: AC
  Administered 2017-05-19: 22:00:00
  Filled 2017-05-19: qty 1

## 2017-05-19 MED ORDER — IOPAMIDOL (ISOVUE-370) INJECTION 76%
INTRAVENOUS | Status: AC
  Filled 2017-05-19: qty 100

## 2017-05-19 MED ORDER — ASPIRIN 81 MG PO CHEW
81.0000 mg | CHEWABLE_TABLET | Freq: Every day | ORAL | Status: DC
  Administered 2017-05-20: 81 mg via ORAL
  Filled 2017-05-19: qty 1

## 2017-05-19 MED ORDER — NITROGLYCERIN 1 MG/10 ML FOR IR/CATH LAB
INTRA_ARTERIAL | Status: DC | PRN
  Administered 2017-05-19 (×3): 200 ug via INTRACORONARY

## 2017-05-19 MED ORDER — LIDOCAINE HCL (PF) 1 % IJ SOLN
INTRAMUSCULAR | Status: DC | PRN
  Administered 2017-05-19: 18 mL

## 2017-05-19 MED ORDER — MIDAZOLAM HCL 2 MG/2ML IJ SOLN
INTRAMUSCULAR | Status: AC
  Filled 2017-05-19: qty 2

## 2017-05-19 MED FILL — Heparin Sodium (Porcine) Inj 1000 Unit/ML: INTRAMUSCULAR | Qty: 10 | Status: AC

## 2017-05-19 SURGICAL SUPPLY — 17 items
BALLN SAPPHIRE ~~LOC~~ 3.25X8 (BALLOONS) ×2 IMPLANT
BALLN WOLVERINE 2.50X10 (BALLOONS) ×2
BALLOON WOLVERINE 2.50X10 (BALLOONS) ×1 IMPLANT
CATH INFINITI 5FR MULTPACK ANG (CATHETERS) ×2 IMPLANT
CATH LAUNCHER 6FR JR4 (CATHETERS) ×2 IMPLANT
CATH VISTA GUIDE 6FR 3DRC (CATHETERS) ×2 IMPLANT
KIT ENCORE 26 ADVANTAGE (KITS) ×2 IMPLANT
KIT HEART LEFT (KITS) ×2 IMPLANT
PACK CARDIAC CATHETERIZATION (CUSTOM PROCEDURE TRAY) ×2 IMPLANT
SHEATH PINNACLE 5F 10CM (SHEATH) ×2 IMPLANT
SHEATH PINNACLE 6F 10CM (SHEATH) ×2 IMPLANT
STENT SYNERGY DES 3X12 (Permanent Stent) ×2 IMPLANT
SYR MEDRAD MARK V 150ML (SYRINGE) ×2 IMPLANT
TRANSDUCER W/STOPCOCK (MISCELLANEOUS) ×2 IMPLANT
TUBING CIL FLEX 10 FLL-RA (TUBING) ×2 IMPLANT
WIRE COUGAR XT STRL 190CM (WIRE) ×2 IMPLANT
WIRE EMERALD 3MM-J .035X150CM (WIRE) ×2 IMPLANT

## 2017-05-19 NOTE — Progress Notes (Signed)
Progress Note  Patient Name: Alejandra Hernandez Date of Encounter: 05/19/2017  Primary Cardiologist: Radford Pax  Subjective   No further chest pain.   Inpatient Medications    Scheduled Meds: . amitriptyline  25 mg Oral Daily   And  . amitriptyline  50 mg Oral QHS  . amLODipine  10 mg Oral Q supper  . [START ON 05/20/2017] aspirin  81 mg Oral Daily  . cloNIDine  0.2 mg Oral TID  . [START ON 05/20/2017] clopidogrel  75 mg Oral Q breakfast  . ferrous sulfate  325 mg Oral BID WC  . gabapentin  300 mg Oral TID  . insulin aspart  0-15 Units Subcutaneous TID WC  . insulin glargine  30 Units Subcutaneous BID  . isosorbide mononitrate  30 mg Oral QHS  . pantoprazole  40 mg Oral QHS  . rosuvastatin  40 mg Oral QHS  . sodium chloride flush  3 mL Intravenous Q12H  . sodium chloride flush  3 mL Intravenous Q12H   Continuous Infusions: . sodium chloride    . sodium chloride 120 mL/hr at 05/19/17 1120  . sodium chloride     PRN Meds: sodium chloride, sodium chloride, acetaminophen, ALPRAZolam, hydrALAZINE, labetalol, nitroGLYCERIN, ondansetron (ZOFRAN) IV, oxyCODONE-acetaminophen **AND** oxyCODONE, sodium chloride flush, sodium chloride flush   Vital Signs    Vitals:   05/19/17 1110 05/19/17 1115 05/19/17 1200 05/19/17 1300  BP:   (!) 155/71 (!) 118/59  Pulse: (!) 137 (!) 160 97 86  Resp:   15 15  Temp:      TempSrc:      SpO2: (!) 0% (!) 0% 97% 98%  Weight:      Height:        Intake/Output Summary (Last 24 hours) at 05/19/17 1413 Last data filed at 05/19/17 0800  Gross per 24 hour  Intake              360 ml  Output             1800 ml  Net            -1440 ml   Filed Weights   05/18/17 0537  Weight: 140 lb (63.5 kg)    Telemetry    SR - Personally Reviewed  ECG    SR - Personally Reviewed  Physical Exam   General: Well developed, well nourished, female appearing in no acute distress. Head: Normocephalic, atraumatic.  Neck: Supple without bruits, JVD. Lungs:   Resp regular and unlabored, CTA. Heart: RRR, S1, S2, no S3, S4, or murmur; no rub. Abdomen: Soft, non-tender, non-distended with normoactive bowel sounds. No hepatomegaly. No rebound/guarding. No obvious abdominal masses. Extremities: No clubbing, cyanosis, edema. Distal pedal pulses are 2+ bilaterally. Right femoral sheath in place.  Neuro: Alert and oriented X 3. Moves all extremities spontaneously. Psych: Normal affect.  Labs    Chemistry Recent Labs Lab 05/14/17 0954  NA 134  K 4.0  CL 96  CO2 18*  GLUCOSE 321*  BUN 7*  CREATININE 1.27*  CALCIUM 9.5  PROT 7.5  ALBUMIN 4.2  AST 19  ALT 17  ALKPHOS 140*  BILITOT <0.2  GFRNONAA 46*  GFRAA 53*     Hematology Recent Labs Lab 05/14/17 0954 05/19/17 0311  WBC 7.9 10.4  RBC 5.36* 4.87  HGB 12.1 10.4*  HCT 38.4 34.3*  MCV 72* 70.4*  MCH 22.6* 21.4*  MCHC 31.5 30.3  RDW 15.0 14.4  PLT 448* 521*    Cardiac  EnzymesNo results for input(s): TROPONINI in the last 168 hours. No results for input(s): TROPIPOC in the last 168 hours.   BNPNo results for input(s): BNP, PROBNP in the last 168 hours.   DDimer No results for input(s): DDIMER in the last 168 hours.    Radiology    No results found.  Cardiac Studies   LHC: 05/19/17-  Discussed with Dr. Claiborne Billings PCI with cutting balloon and DES x1 to RCA.   Patient Profile     61 y.o. female with PMH of HTN, DM, DVT and chest pain who presented for outpatient cath with Dr. Irish Lack. Attempted radial access but unable to obtain. Concern with recent dose of Xarelto, so held overnight and planned for femoral access.   Assessment & Plan    1. CAD: Underwent LHC with DR. Kelly with successful PCI with cutting balloon and DESx1 to RCA. DAPT with ASA/plavix. Of note is on Xarelto for hx of DVTs. Likely plan for triple therapy for one month, then dc ASA.   2. HTN: Stable post cath  3. PAD: followed by Dr. Fletcher Anon  4. Bilateral DVTs: Xarelto held pre cath. Plan to resume in the  am if no bleeding complications  5. HL: on statin  Signed, Reino Bellis, NP  05/19/2017, 2:13 PM    Patient seen, examined. Available data reviewed. Agree with findings, assessment, and plan as outlined by Reino Bellis, NP-C. the patient is doing well post-PCI. She has no chest pain or shortness of breath. On my exam she is alert and oriented in no distress, lungs are clear, heart is regular rate and rhythm with a 2/6 systolic ejection murmur at the right upper sternal border. Abdomen is soft and nontender, the right groin site is clear with a dressing in place. There is no pretibial edema present. The skin is warm and dry. I reviewed the patient's cardiac catheterization studies from today. She underwent single-vessel PCI of the right coronary artery using a Synergy DES. Would anticipate that she will require low-dose aspirin, Plavix, and Xarelto for a short period of time followed by discontinuation of aspirin may be after 30 days. Will await Dr. Evette Georges recommendation from his cardiac catheterization note when it is completed. Anticipate hospital discharge tomorrow.  Sherren Mocha, M.D. 05/19/2017 3:20 PM

## 2017-05-19 NOTE — Care Management Note (Signed)
Case Management Note  Patient Details  Name: Alejandra Hernandez MRN: [id number] Date of Birth: 1956-02-26  Subjective/Objective:                 Patient from home, OIB for cath. Anticipate Plavix/ ASA post procedure per MAR. Medication coverage through [facility] and Medicaid.  PCP Everrett Coombe   Action/Plan:  No other CM needs identified at this time. CM will continue to follow.  Expected Discharge Date:  05/21/17               Expected Discharge Plan:  Home/Self Care  In-House Referral:     Discharge planning Services  CM Consult  Post Acute Care Choice:    Choice offered to:     DME Arranged:    DME Agency:     HH Arranged:    HH Agency:     Status of Service:  In process, will continue to follow  If discussed at Long Length of Stay Meetings, dates discussed:    Additional Comments:  Carles Collet, RN 05/19/2017, 1:57 PM

## 2017-05-19 NOTE — Interval H&P Note (Signed)
Cath Lab Visit (complete for each Cath Lab visit)  Clinical Evaluation Leading to the Procedure:   ACS: No.  Non-ACS:    Anginal Classification: CCS IV  Anti-ischemic medical therapy: Maximal Therapy (2 or more classes of medications)  Non-Invasive Test Results: No non-invasive testing performed  Prior CABG: No previous CABG      History and Physical Interval Note:  05/19/2017 9:41 AM  Alejandra Hernandez  has presented today for surgery, with the diagnosis of cp  The various methods of treatment have been discussed with the patient and family. After consideration of risks, benefits and other options for treatment, the patient has consented to  Procedure(s): Left Heart Cath and Coronary Angiography (N/A) as a surgical intervention .  The patient's history has been reviewed, patient examined, no change in status, stable for surgery.  I have reviewed the patient's chart and labs.  Questions were answered to the patient's satisfaction.     Shelva Majestic

## 2017-05-19 NOTE — Discharge Summary (Signed)
Discharge Summary    Patient ID: Alejandra Hernandez,  MRN: [id number], DOB/AGE: [birth year] 61 y.o.  Admit date: 05/18/2017 Discharge date: 05/20/2017  Primary Care Provider: Everrett Coombe Primary Cardiologist: Radford Pax  Discharge Diagnoses    Active Problems:   Angina pectoris [facility])   Hyperlipidemia   Hypertension   Allergies No Known Allergies  Diagnostic Studies/Procedures    LHC: 05/19/17  Conclusion     Mid Cx lesion, 60 %stenosed.  Prox Cx to Mid Cx lesion, 20 %stenosed.  A STENT SYNERGY DES 3X12 drug eluting stent was successfully placed.  Mid RCA lesion, 90 %stenosed.  Post intervention, there is a 0% residual stenosis.   Two-vessel coronary obstructive disease with 20% mid, and 60% distal smooth AV groove stenosis of a codominant left circumflex coronary artery, and 90% eccentric focal mid RCA stenosis.  Normal LAD.  LVEDP at 5 mmHg.  Successful PCI to the mid RCA utilizing cutting balloon atherotomy and ultimate DES stenting with a 3.012 mm Synergy DES stent postdilated to 3.25 mm with the 90% stenosis being reduced to 0% and brisk TIMI-3 flow.  RECOMMENDATION: The patient will initially be treated with triple drug therapy with aspirin/Plavix, and resumption of Xarelto with her DVT history.  Plans for discontinuance of aspirin after at least one month of therapy with continuation of Plavix/Xarelto.  Plan for aggressive high potency statin therapy.  Smoking and tobacco cessation is imperative.  Medical therapy for concomitant CAD.   _____________   History of Present Illness     61 y.o. female with a history of HTN, DM, DVT and atypical CP and SOB with normal nuclear stress test and echo. Her CP has been felt to be due to GERD with esophageal spasm and she has been on Protonix. She was in [facility] earlier in the year with complaints of CP that again was atypical and worse with cough and reproducible with palpation. She was found to have influenza and had a  mild troponin elevation felt due to demand ischemia from influenza and DKA. She was also in acute kidney failure at that time with a creatinine of 2.49. Coronary artery calcification were noted on Chest CT.  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 She is followed by Dr. Fletcher Anon for her PVD and has disease also affecting the right SFA with right calf claudication but further PTA has been placed on hold due to recent anemia and heme positive stools. Her most recently echo earlier this month showed normal LVF with grade I DD.   At last OV Dr. Radford Pax was concerned that she may have CAD given her coronary artery calcifications and history of PVD with recent trop elevation during influenza as she has several CRFs.  Further workup though was placed on hold due to significant anemia that needed workup prior to considering cath in case she needed to be on DAPT given that she was already on NOAC for her DVTs. GI has now said that she is stable from their standpoint to proceed with cath if needed.  She was also seeing Pulmonary for a lung mass noted on CT and we were also awaiting clearance from them as she would need to stay on antiplatelet RX after possible stent and we wanted to make sure she did not need any procedures from a pulmonary standpoint first.   She underwent colonoscopy showing multiple adenomatous polyps 01/2017 which  were precancerous and GI felt ok to proceed with cardiac workup as her Hbg has been stable and no source of bleeding was found. She was seen back by Dr. Lamonte Sakai and was felt to represent RUL PNA and repeat CT of chest is scheduled for followup in the fall.    She was last seen in the office by Dr. Radford Pax on 05/14/17 and reported intermittent chest pain that improved with SL nitro. Given recent clearance by other providers and ongoing chest discomfort she was referred for outpatient cardiac cath.   Hospital Course      She presented on 05/18/17 for cath. Dr. Irish Lack attempted to access radially but unsuccessful. Given her recent dose of Xarelto, she was admitted overnight with plans for femoral access. Labs were stable the following day. She underwent LHC with Dr. Claiborne Billings on 05/19/17 noted above with PCI to mid RCA with cutting balloon and DES x1. Post cath labs were stable with Cr 1.37 and Hgb 9.4. No complications noted post cath. Able to work with cardiac rehab. Given Xarelto, will plan for triple therapy for one month with ASA/Plavix and Xarelto, the plan to stop ASA after a month. No complications noted post cath. Worked with cardiac rehab. Metoprolol 12.5mg  BID added this admission for better blood pressure control.   She was seen by Dr. Burt Knack and determined stable for discharge home. Follow up has been arranged. Medications are listed below.   General: Well developed, well nourished, female appearing in no acute distress. Head: Normocephalic, atraumatic.  Neck: Supple without bruits, JVD. Lungs:  Resp regular and unlabored, CTA. Heart: RRR, S1, S2, no S3, S4, 2/6 systolic murmur; no rub. Abdomen: Soft, non-tender, non-distended with normoactive bowel sounds. No hepatomegaly. No rebound/guarding. No obvious abdominal masses. Extremities: No clubbing, cyanosis, edema. Distal pedal pulses are 2+ bilaterally. R femoral cath site stable without bruising or hematoma Neuro: Alert and oriented X 3. Moves all extremities spontaneously. Psych: Normal affect.  _____________  Discharge Vitals Blood pressure (!) 143/49, pulse 99, temperature 97.7 F (36.5 C), temperature source Oral, resp. rate 14, height 5\' 2"  (1.575 m), weight 138 lb 14.2 oz (63 kg), SpO2 93 %.  Filed Weights   05/18/17 0537 05/20/17 0604  Weight: 140 lb (63.5 kg) 138 lb 14.2 oz (63 kg)    Labs & Radiologic Studies    CBC  Recent Labs  05/19/17 0311 05/20/17 0202  WBC 10.4 7.5  HGB 10.4* 9.4*  HCT 34.3* 31.8*  MCV 70.4* 71.6*  PLT  521* 818*   Basic Metabolic Panel  Recent Labs  05/20/17 0202  NA 138  K 3.5  CL 110  CO2 22  GLUCOSE 107*  BUN 6  CREATININE 1.37*  CALCIUM 8.8*   Liver Function Tests No results for input(s): AST, ALT, ALKPHOS, BILITOT, PROT, ALBUMIN in the last 72 hours. No results for input(s): LIPASE, AMYLASE in the last 72 hours. Cardiac Enzymes No results for input(s): CKTOTAL, CKMB, CKMBINDEX, TROPONINI in the last 72 hours. BNP Invalid input(s): POCBNP D-Dimer No results for input(s): DDIMER in the last 72 hours. Hemoglobin A1C No results for input(s): HGBA1C in the last 72 hours. Fasting Lipid Panel No results for input(s): CHOL, HDL, LDLCALC, TRIG, CHOLHDL, LDLDIRECT in the last 72 hours. Thyroid Function Tests No results for input(s): TSH, T4TOTAL, T3FREE, THYROIDAB in the last 72 hours.  Invalid input(s): FREET3 _____________  No results found. Disposition   Pt is being discharged home today in good condition.  Follow-up Plans & Appointments  Follow-up Information    Consuelo Pandy, PA-C Follow up on 06/02/2017.   Specialties:  Cardiology, Radiology Why:  at 930am for your follow up appt.  Contact information: [address] [place] [address] [phone]          Discharge Instructions    Amb Referral to Cardiac Rehabilitation    Complete by:  As directed    Diagnosis:  Coronary Stents   Call MD for:  redness, tenderness, or signs of infection (pain, swelling, redness, odor or green/yellow discharge around incision site)    Complete by:  As directed    Diet - low sodium heart healthy    Complete by:  As directed    Discharge instructions    Complete by:  As directed    Groin Site Care Refer to this sheet in the next few weeks. These instructions provide you with information on caring for yourself after your procedure. Your caregiver may also give you more specific instructions. Your treatment has been planned according to current  medical practices, but problems sometimes occur. Call your caregiver if you have any problems or questions after your procedure. HOME CARE INSTRUCTIONS You may shower 24 hours after the procedure. Remove the bandage (dressing) and gently wash the site with plain soap and water. Gently pat the site dry.  Do not apply powder or lotion to the site.  Do not sit in a bathtub, swimming pool, or whirlpool for 5 to 7 days.  No bending, squatting, or lifting anything over 10 pounds (4.5 kg) as directed by your caregiver.  Inspect the site at least twice daily.  Do not drive home if you are discharged the same day of the procedure. Have someone else drive you.  You may drive 24 hours after the procedure unless otherwise instructed by your caregiver.  What to expect: Any bruising will usually fade within 1 to 2 weeks.  Blood that collects in the tissue (hematoma) may be painful to the touch. It should usually decrease in size and tenderness within 1 to 2 weeks.  SEEK IMMEDIATE MEDICAL CARE IF: You have unusual pain at the groin site or down the affected leg.  You have redness, warmth, swelling, or pain at the groin site.  You have drainage (other than a small amount of blood on the dressing).  You have chills.  You have a fever or persistent symptoms for more than 72 hours.  You have a fever and your symptoms suddenly get worse.  Your leg becomes pale, cool, tingly, or numb.  You have heavy bleeding from the site. Hold pressure on the site. Marland Kitchen   PLEASE DO NOT MISS ANY DOSES OF YOUR PLAVIX. Will need to be on aspirin, plavix, and xarelto for one month. Then stop aspirin, and continue plavix and xarelto.   Increase activity slowly    Complete by:  As directed       Discharge Medications   Current Discharge Medication List    START taking these medications   Details  aspirin 81 MG chewable tablet Chew 1 tablet (81 mg total) by mouth daily.    clopidogrel (PLAVIX) 75 MG tablet Take 1 tablet (75  mg total) by mouth daily with breakfast. Qty: 90 tablet, Refills: 3    metoprolol tartrate (LOPRESSOR) 25 MG tablet Take 0.5 tablets (12.5 mg total) by mouth 2 (two) times daily. Qty: 60 tablet, Refills: 2      CONTINUE these medications which have NOT CHANGED  Details  ALPRAZolam (XANAX) 0.5 MG tablet Take 0.5 mg by mouth daily as needed for anxiety.  Refills: 2    amitriptyline (ELAVIL) 25 MG tablet Take 25-50 mg by mouth See admin instructions. Take 25 mg by mouth in the morning and take 50 mg by mouth at bedtime Refills: 5    amLODipine (NORVASC) 10 MG tablet Take 10 mg by mouth daily with supper.     cloNIDine (CATAPRES) 0.2 MG tablet TAKE 1 TABLET(0.2 MG) BY MOUTH THREE TIMES DAILY Qty: 270 tablet, Refills: 0   Associated Diagnoses: Essential hypertension    ferrous sulfate 325 (65 FE) MG tablet Take 325 mg by mouth 2 (two) times daily with a meal.    gabapentin (NEURONTIN) 600 MG tablet Take 0.5 tablets (300 mg total) by mouth 3 (three) times daily. Qty: 90 tablet, Refills: 2    HUMALOG KWIKPEN 100 UNIT/ML KiwkPen Inject 2-5 Units into the skin 2 (two) times daily as needed (for high blood sugar). Per sliding scale Refills: 11    insulin glargine (LANTUS) 100 UNIT/ML injection Inject 30 Units into the skin 2 (two) times daily.     isosorbide mononitrate (IMDUR) 30 MG 24 hr tablet Take 30 mg by mouth at bedtime.    nitroGLYCERIN (NITROSTAT) 0.4 MG SL tablet Place 1 tablet (0.4 mg total) under the tongue every 5 (five) minutes as needed for chest pain. Qty: 25 tablet, Refills: 3    pantoprazole (PROTONIX) 40 MG tablet Take 40 mg by mouth at bedtime.    rivaroxaban (XARELTO) 20 MG TABS tablet Take 1 tablet (20 mg total) by mouth daily with supper. Qty: 30 tablet, Refills: 3    rosuvastatin (CRESTOR) 40 MG tablet Take 40 mg by mouth at bedtime.    ARTIFICIAL TEAR OP Place 2 drops into both eyes as needed (for dry eyes).    glucose blood test strip Use as  instructed Qty: 100 each, Refills: 12   Associated Diagnoses: Type 2 diabetes mellitus with complication, with long-term current use of insulin (HCC)    oxyCODONE-acetaminophen (PERCOCET) 10-325 MG tablet Take 1 tablet by mouth 3 (three) times daily as needed for pain. Refills: 0         Aspirin prescribed at discharge?  Yes High Intensity Statin Prescribed? (Lipitor 40-80mg  or Crestor 20-40mg ): Yes Beta Blocker Prescribed? Yes For EF <40%, was ACEI/ARB Prescribed? No: CKD ADP Receptor Inhibitor Prescribed? (i.e. Plavix etc.-Includes Medically Managed Patients): Yes For EF <40%, Aldosterone Inhibitor Prescribed? No Was EF assessed during THIS hospitalization? Yes Was Cardiac Rehab II ordered? (Included Medically managed Patients): Yes   Outstanding Labs/Studies   CBC at follow up visit.   Duration of Discharge Encounter   Greater than 30 minutes including physician time.  Signed, Reino Bellis NP-C 05/20/2017, 10:24 AM   Patient seen, examined. Available data reviewed. Agree with findings, assessment, and plan as outlined by Reino Bellis, NP-C.   On my exam today the patient's right groin site is clear. Heart is regular rate and rhythm. Lung fields are clear bilaterally. Abdomen is soft and nontender. There is no pretibial edema.  The patient is clinically stable after undergoing PCI yesterday. She is chronically anticoagulated with Xarelto which can be started back at her dinnertime dose today. She will be on aspirin for one month at low dose 81 mg daily. She should take clopidogrel for at least 3-6 months if tolerated.  Sherren Mocha, M.D. 05/20/2017 11:32 AM

## 2017-05-19 NOTE — Progress Notes (Signed)
Patient unable to urine for several hours after cardiac cath. On bedrest and after attempting to urinate without success, bladder scan completed to show 9107mls of urine. Straight cathed for urine retention and resulted in urine output of 1500 mls. Urine drained slowly with no adverse symptoms. Sheath pulled after patient straight cathed.   Site area: right groin  Site Prior to Removal:  Level 0  Pressure Applied For 20 MINUTES    Minutes Beginning at 1420  Manual:   Yes.    Patient Status During Pull:  Stable   Post Pull Groin Site:  Level 0  Post Pull Instructions Given:  Yes.    Post Pull Pulses Present:  Yes.    Dressing Applied:  Yes.    Comments:  Bedrest started at 1440  Patient resting quietly, HOB adjusted to relieve back pain.

## 2017-05-19 NOTE — Progress Notes (Signed)
ANTICOAGULATION CONSULT NOTE  Pharmacy Consult for heparin Indication: DVT  Heparin Dosing Weight: 62.9 kg   Assessment: 20 yof admitted to re-attempt cath 6/26 through femoral acess. On Xarelto PTA for hx bilateral DVT - last dose 6/23. Pharmacy consulted to start heparin 6 hours post-sheath removal. Will dose based on aPTT while Xarelto influencing heparin levels. CBC ok. No bleed documented.  aPTT supratherapeutic at 122 this AM, but per RN, drip has already been stopped this AM for cath lab. Will f/u post-cath.  Goal of Therapy:  Heparin level 0.3-0.7 units/ml aPTT 66-102 seconds Monitor platelets by anticoagulation protocol: Yes   Plan:  Heparin drip off for cath 6/26 AM F/u resuming Xarelto post-cath  Elicia Lamp, PharmD, BCPS Clinical Pharmacist Rx Phone # for today: [phone] After 3:30PM, please call Main Rx: [phone] 05/19/2017 9:22 AM

## 2017-05-20 ENCOUNTER — Encounter (HOSPITAL_COMMUNITY): Payer: Self-pay | Admitting: Cardiovascular Disease

## 2017-05-20 DIAGNOSIS — I9788 Other intraoperative complications of the circulatory system, not elsewhere classified: Secondary | ICD-10-CM | POA: Diagnosis not present

## 2017-05-20 DIAGNOSIS — I25119 Atherosclerotic heart disease of native coronary artery with unspecified angina pectoris: Secondary | ICD-10-CM | POA: Diagnosis not present

## 2017-05-20 DIAGNOSIS — I209 Angina pectoris, unspecified: Secondary | ICD-10-CM | POA: Diagnosis not present

## 2017-05-20 DIAGNOSIS — I82403 Acute embolism and thrombosis of unspecified deep veins of lower extremity, bilateral: Secondary | ICD-10-CM | POA: Diagnosis not present

## 2017-05-20 DIAGNOSIS — E1151 Type 2 diabetes mellitus with diabetic peripheral angiopathy without gangrene: Secondary | ICD-10-CM | POA: Diagnosis not present

## 2017-05-20 LAB — CBC
HCT: 31.8 % — ABNORMAL LOW (ref 36.0–46.0)
Hemoglobin: 9.4 g/dL — ABNORMAL LOW (ref 12.0–15.0)
MCH: 21.2 pg — ABNORMAL LOW (ref 26.0–34.0)
MCHC: 29.6 g/dL — ABNORMAL LOW (ref 30.0–36.0)
MCV: 71.6 fL — ABNORMAL LOW (ref 78.0–100.0)
PLATELETS: 449 10*3/uL — AB (ref 150–400)
RBC: 4.44 MIL/uL (ref 3.87–5.11)
RDW: 14.9 % (ref 11.5–15.5)
WBC: 7.5 10*3/uL (ref 4.0–10.5)

## 2017-05-20 LAB — BASIC METABOLIC PANEL
Anion gap: 6 (ref 5–15)
BUN: 6 mg/dL (ref 6–20)
CALCIUM: 8.8 mg/dL — AB (ref 8.9–10.3)
CO2: 22 mmol/L (ref 22–32)
CREATININE: 1.37 mg/dL — AB (ref 0.44–1.00)
Chloride: 110 mmol/L (ref 101–111)
GFR calc non Af Amer: 41 mL/min — ABNORMAL LOW (ref >60)
GFR, EST AFRICAN AMERICAN: 48 mL/min — AB (ref >60)
Glucose, Bld: 107 mg/dL — ABNORMAL HIGH (ref 65–99)
Potassium: 3.5 mmol/L (ref 3.5–5.1)
SODIUM: 138 mmol/L (ref 135–145)

## 2017-05-20 LAB — GLUCOSE, CAPILLARY: GLUCOSE-CAPILLARY: 124 mg/dL — AB (ref 65–99)

## 2017-05-20 MED ORDER — METOPROLOL TARTRATE 25 MG PO TABS: 12.5000 mg | ORAL_TABLET | Freq: Two times a day (BID) | ORAL | 2 refills | Status: DC

## 2017-05-20 MED ORDER — CLOPIDOGREL BISULFATE 75 MG PO TABS: 75.0000 mg | ORAL_TABLET | Freq: Every day | ORAL | 3 refills | Status: DC

## 2017-05-20 MED ORDER — ASPIRIN 81 MG PO CHEW: 81.0000 mg | CHEWABLE_TABLET | Freq: Every day | ORAL | Status: DC

## 2017-05-20 NOTE — Progress Notes (Signed)
Pt called out asking for pain medication; I told the patient her pain medication was not to be given at this time as it is too close to previous dose. Assisted her to the bathroom and when she stood up percocet fell onto bed. I asked the patient about this and she said "when I turned the cup up to take my medicine they fell into my gown, the other pill may be in the bed too." I did not find the oxycodone 5mg  tab. Pt states "I may have found that one and took it." When I administered her 2200 medications patient took her meds and then states "hurry hurry! im out of water, get me some from the sink" patient given water and she drank water and swallowed. Med cup was empty. Patient at this time states she wants to take percocet found in bed.

## 2017-05-20 NOTE — Progress Notes (Signed)
CARDIAC REHAB PHASE I   PRE:  Rate/Rhythm: 94 SR  BP:  Sitting: 143/60        SaO2: 94 RA  MODE:  Ambulation: 140 ft   POST:  Rate/Rhythm: 114 ST  BP:  Sitting: 143/49         SaO2: 98 RA  Pt ambulated 140 ft on RA, rolling walker, IV, assist x1, mostly steady gait, tolerated fairly well.  Pt c/o mild dizziness upon return to room, denies cp, DOE, declined rest stop. Completed PCI/stent education.  Reviewed risk factors, tobacco cessation, anti-platelet therapy, stent card, activity restrictions, ntg, exercise, heart healthy diet, carb counting, and phase 2 cardiac rehab. Pt verbalized understanding. Pt agrees to phase 2 cardiac rehab referral, will send to [facility]. Pt to bed per pt request after walk, call bell within reach.  [phone] Lenna Sciara, RN, BSN 05/20/2017 8:59 AM

## 2017-05-21 ENCOUNTER — Other Ambulatory Visit: Payer: Self-pay | Admitting: Family Medicine

## 2017-05-21 ENCOUNTER — Telehealth: Payer: Self-pay

## 2017-05-21 NOTE — Telephone Encounter (Signed)
Patient contacted regarding discharge from [facility] on 05/20/2017  Patient understands to follow up with provider Ellen Henri 06/02/2017 @ 0930 at [facility].  Pt also indicates she has appt at [facility] to place event monitor on 05/26/2017.  Patient understands discharge instructions? Yes-denies any educational needs  Patient understands medications and regiment? Yes-indicates she has all new medications  Patient understands to bring all medications to this visit? yes  Pt c/o pain in her right arm from wrist to elbow where they attempted to do her cath radially.  Pt asked about what she could do for pain.  Encouraged Pt to use warm compresses to area.  Asked Pt to call office if pain became worse.  Pt indicates understanding.  Asked if Pt had tylenol for pain.  Patient indicates she takes percocet for pain.  Notified Pt not to take any ibuprofen for pain d/t bleeding risk with her medication regimen.  Pt indicates understanding.  No further needs at this time.

## 2017-05-21 NOTE — Telephone Encounter (Signed)
-----   Message from Cheryln Manly, NP sent at 05/20/2017  9:43 AM EDT ----- Regarding: TOC f/u appt Needs TOC f/u call   Thx Reino Bellis

## 2017-05-22 ENCOUNTER — Encounter: Payer: Self-pay | Admitting: Emergency Medicine

## 2017-05-22 ENCOUNTER — Telehealth (HOSPITAL_COMMUNITY): Payer: Self-pay

## 2017-05-22 ENCOUNTER — Ambulatory Visit: Payer: Medicare Other

## 2017-05-22 ENCOUNTER — Ambulatory Visit (INDEPENDENT_AMBULATORY_CARE_PROVIDER_SITE_OTHER): Payer: Medicare Other | Admitting: Emergency Medicine

## 2017-05-22 DIAGNOSIS — K769 Liver disease, unspecified: Secondary | ICD-10-CM | POA: Insufficient documentation

## 2017-05-22 DIAGNOSIS — R918 Other nonspecific abnormal finding of lung field: Secondary | ICD-10-CM

## 2017-05-22 DIAGNOSIS — K7689 Other specified diseases of liver: Secondary | ICD-10-CM | POA: Diagnosis not present

## 2017-05-22 LAB — PULMONARY FUNCTION TEST
FEF 25-75 Pre: 1.14 L/sec
FEF2575-%PRED-PRE: 59 %
FEV1-%PRED-PRE: 86 %
FEV1-PRE: 1.65 L
FEV1FVC-%Pred-Pre: 91 %
FEV6-%Pred-Pre: 97 %
FEV6-Pre: 2.27 L
FEV6FVC-%Pred-Pre: 104 %
FVC-%PRED-PRE: 94 %
FVC-Pre: 2.27 L
Pre FEV1/FVC ratio: 73 %
Pre FEV6/FVC Ratio: 100 %

## 2017-05-22 NOTE — Assessment & Plan Note (Signed)
Negative on biopsy, decreasing in size on PET scan and cardiac CT scan done in April. I suspect that this is developing scar after a pneumonia. She has a repeat CT scan of the chest planned for October 2018. Follow up after that to review the results.

## 2017-05-22 NOTE — Telephone Encounter (Signed)
Patient insurances are active and benefits verified. Patient has [facility] Medicare and Medicaid. UHC Medicare- no co-payment, no deductible, out of pocket $6700/$0 has been met, no co-insurance, no pre-authorization and no limit on visit. Passport/reference [phone]. Medicaid - no co-payment, no deductible, no out of pocket, no pre-authorization, no co-insurance. Passport/reference [id number].   Medicaid reimbursement form faxed to Dr. Radford Pax on 05/22/17 to complete and sign for determination of eligibility to attend cardiac rehab. Patient can't be scheduled until this form is completed, signed, and faxed back to cardiac rehab.

## 2017-05-22 NOTE — Patient Instructions (Signed)
Please plan to get your CT scan of the abdomen and of the chest in October 2018 Follow with Dr Lamonte Sakai in October after the scans to review the results.  Follow with Dr Ardis Hughs as planned. We will review scan results with him as well.

## 2017-05-22 NOTE — Assessment & Plan Note (Signed)
Unclear etiology, noted on her PET scan when we were doing surveillance for her right upper lobe lesion. We have planned for a CT scan of the abdomen to be done in October 2018. She is following with Dr. Ardis Hughs.

## 2017-05-22 NOTE — Progress Notes (Signed)
Subjective:    Patient ID: Alejandra Hernandez, female    DOB: 07-15-1956, 61 y.o.   MRN: [id number]  HPI 61 year old woman who follows up after hospitalization in February and she has a history of tobacco use, was admitted with flulike symptoms and DKA. She was found to have an 0.5 cm anterior right upper lobe mass with associated mediastinal lymphadenopathy and a small right effusion. I performed endobronchial ultrasound, right upper lobe and mediastinal node biopsies on 01/09/17.Marland Kitchen Cytology was all negative. Subsequent PET scan performed on 02/02/17 was reviewed by me. This showed significant decrease in size of her right upper lobe mass lesion. There was some associated cavitation and her bronchograms, suggestive of infection. Also noted were 2 regions of uptake in the liver. She was evaluated by Dr. Ardis Hughs gastroenterology. She had some typical adenomatous polyps on colonoscopy. An MRI of the abdomen showed no specific lesions to correspond with the PET scan activity. She still smokes some - every now and then. She has some cough. Has breakthrough GERD sx, some minimal PND.  ROV 05/22/17 -- This follow-up visit for patient who was found to have a 0.5 cm anterior right upper lobe mass and some associated midsternal lymphadenopathy on hospitalization in February. I performed endobronchial ultrasound and right upper lobe biopsies that were negative. The right upper lobe lesion was smaller on subsequent imaging but there were 2 regions of abnormal uptake in the liver. We have been planning for repeat CT scans of both the chest and the abdomen to do surveillance. She had a cardiac CT scan in April that showed evolving scar in the right upper lobe. She underwent L heart for chest pain in the last week, underwent stent placement. She due for CT's abdomen and chest in 08/2017. She has minimal cough. Has not seen any hemoptysis. Her exercise tolerance has been poor.   Review of Systems As per HPI  Past Medical  History:  Diagnosis Date  . AKI (acute kidney injury) ([place]) 01/2016  . Anemia   . Anxiety   . Arthritis    "right knee, back, feet" (07/30/2016)  . Bleeding stomach ulcer 01/2016  . CAD (coronary artery disease)    05/19/17 PCI with DESx1 to [facility]  . Carotid artery disease ([place])    a. Carotid US 7/16: Bilateral ICA 60-79% >> follow-up 1 year  //  b. Carotid US 7/17: 72-53% RICA; 66-44% LICA >> Follow-up 1 year  . Chronic back pain   . Chronic lower back pain   . Coronary artery disease   . Diabetic peripheral neuropathy ([place])   . DVT (deep venous thrombosis) ([place])    bilateral diagnosed 01/2017  . GERD (gastroesophageal reflux disease)   . Gout   . Headache    "weekly" (07/30/2016)  . Heart murmur   . History of blood transfusion    "related to stomach bleeding"  . History of echocardiogram    a. Echo 3/17 mild concentric LVH, vigorous LVF, EF 65-70%, normal wall motion, grade 1 diastolic dysfunction, mild TR, PASP 31 mmHg  . History of nuclear stress test    a. Myoview 6/15: Low risk, no scar or ischemia, EF 72%  //  b. Myoview 7/17: EF 82%, no scar or ischemia, low risk  . Hyperlipidemia   . Hypertension   . PAD (peripheral artery disease) ([place])    a. ABIs 7/17: R 0.73, L 0.64, waveforms suggest bilateral SFA disease b. 07/30/16 balloon angioplasty to left SFA with atherectomy   .  Type II diabetes mellitus ([facility])      Family History  Problem Relation Age of Onset  . Diabetes Mother   . Heart disease Mother   . Kidney failure Mother   . Thyroid cancer Mother   . Liver cancer Sister   . Diabetes Sister   . Colon cancer Neg Hx   . Stomach cancer Neg Hx      Social History   Social History  . Marital status: Single    Spouse name: N/A  . Number of children: 5  . Years of education: N/A   Occupational History  . Disabled    Social History Main Topics  . Smoking status: Former Smoker    Packs/day: 0.50    Years: 40.00    Types: Cigarettes, E-cigarettes    Quit date:  05/17/2017  . Smokeless tobacco: Current User    Types: Snuff    Last attempt to quit: 11/24/1972  . Alcohol use No     Comment: 07/30/2016 "NO ALCOHOL IN THE LAST 3 YEARS"  . Drug use: No  . Sexual activity: No   Other Topics Concern  . Not on file   Social History Narrative  . No narrative on file     No Known Allergies   Outpatient Medications Prior to Visit  Medication Sig Dispense Refill  . ALPRAZolam (XANAX) 0.5 MG tablet Take 0.5 mg by mouth daily as needed for anxiety.   2  . amitriptyline (ELAVIL) 25 MG tablet Take 25-50 mg by mouth See admin instructions. Take 25 mg by mouth in the morning and take 50 mg by mouth at bedtime  5  . amLODipine (NORVASC) 10 MG tablet Take 10 mg by mouth daily with supper.     . ARTIFICIAL TEAR OP Place 2 drops into both eyes as needed (for dry eyes).    Marland Kitchen aspirin 81 MG chewable tablet Chew 1 tablet (81 mg total) by mouth daily.    . cloNIDine (CATAPRES) 0.2 MG tablet TAKE 1 TABLET(0.2 MG) BY MOUTH THREE TIMES DAILY 270 tablet 0  . clopidogrel (PLAVIX) 75 MG tablet Take 1 tablet (75 mg total) by mouth daily with breakfast. 90 tablet 3  . ferrous sulfate 325 (65 FE) MG tablet Take 325 mg by mouth 2 (two) times daily with a meal.    . gabapentin (NEURONTIN) 600 MG tablet Take 0.5 tablets (300 mg total) by mouth 3 (three) times daily. 90 tablet 2  . HUMALOG KWIKPEN 100 UNIT/ML KiwkPen Inject 2-5 Units into the skin 2 (two) times daily as needed (for high blood sugar). Per sliding scale  11  . insulin glargine (LANTUS) 100 UNIT/ML injection Inject 30 Units into the skin 2 (two) times daily.     . isosorbide mononitrate (IMDUR) 30 MG 24 hr tablet Take 30 mg by mouth at bedtime.    . metoprolol tartrate (LOPRESSOR) 25 MG tablet Take 0.5 tablets (12.5 mg total) by mouth 2 (two) times daily. 60 tablet 2  . nitroGLYCERIN (NITROSTAT) 0.4 MG SL tablet Place 1 tablet (0.4 mg total) under the tongue every 5 (five) minutes as needed for chest pain. 25 tablet 3  .  oxyCODONE-acetaminophen (PERCOCET) 10-325 MG tablet Take 1 tablet by mouth 3 (three) times daily as needed for pain.  0  . pantoprazole (PROTONIX) 40 MG tablet Take 40 mg by mouth at bedtime.    . rivaroxaban (XARELTO) 20 MG TABS tablet Take 1 tablet (20 mg total) by mouth daily with supper. [place]  tablet 3  . rosuvastatin (CRESTOR) 40 MG tablet Take 40 mg by mouth at bedtime.    Marland Kitchen glucose blood test strip Use as instructed (Patient not taking: Reported on 05/18/2017) 100 each 12   No facility-administered medications prior to visit.          Objective:   Physical Exam Vitals:   05/22/17 1206  BP: 114/64  Pulse: 78  SpO2: 97%  Weight: 139 lb (63 kg)  Height: 5\' 2"  (1.575 m)   Gen: Pleasant, well-nourished, in no distress,  normal affect  ENT: No lesions,  mouth clear,  oropharynx clear, no postnasal drip  Neck: No JVD, no stridor  Lungs: No use of accessory muscles, clear without rales or rhonchi  Cardiovascular: RRR, heart sounds normal, no murmur or gallops, no peripheral edema  Musculoskeletal: No deformities, no cyanosis or clubbing  Neuro: alert, non focal  Skin: Warm, no lesions or rashes  02/02/17 --  COMPARISON:  CT scan January 02, 2017  FINDINGS: NECK  No hypermetabolic lymph nodes in the neck.  CHEST  The masslike opacity in the right upper lobe persists but is much smaller when compared January 02, 2017. There is a associated cavitation which persists as well as air bronchograms. The maximum SUV within this mass is 6.3. The remainder of the right upper lobe opacity seen on the previous study has resolved and there is no evidence of lymphangitic spread of tumor. No other abnormalities are seen within the lungs. The prominent lymph nodes seen on the comparison CT scan are smaller in the interval. No FDG avid nodes are identified to suggest metastatic disease.  ABDOMEN/PELVIS  There is a small rounded region of increased uptake in the liver  on image 71 with a maximum SUV of 5.4. A second smaller focus is seen on image 73 in the left hepatic lobe with a max SUV of 4.8 No other discrete regions of abnormal uptake seen in the liver. No other abnormal uptake is seen within the abdomen or pelvis.  SKELETON  No focal hypermetabolic activity to suggest skeletal metastasis.  IMPRESSION: 1. The masslike opacity with air bronchograms and cavitation in the right upper lobe is much smaller in the interval but does persist. Based on the lung findings alone, the abnormality could represent incompletely resolved infection but an underlying malignancy is not excluded. However, the 2 regions of increased uptake in the liver raise the possibility of metastatic disease. Recommend an MRI of the liver. If there is evidence of metastatic disease on the MRI, recommend tissue sampling of the right upper lobe abnormality. If there is no evidence of metastatic disease in the liver on MRI, the right upper lobe abnormality could be further evaluated with a short-term follow-up CT scan in 1-2 months to assess for further improvement versus more immediate tissue sampling.       Assessment & Plan:  Mass of upper lobe of right lung Negative on biopsy, decreasing in size on PET scan and cardiac CT scan done in April. I suspect that this is developing scar after a pneumonia. She has a repeat CT scan of the chest planned for October 2018. Follow up after that to review the results.  Nodule on liver Unclear etiology, noted on her PET scan when we were doing surveillance for her right upper lobe lesion. We have planned for a CT scan of the abdomen to be done in October 2018. She is following with Dr. Ardis Hughs.  Baltazar Apo, MD, PhD 05/22/2017, 12:29  PM [facility] [phone] or if no answer [phone]

## 2017-05-23 ENCOUNTER — Other Ambulatory Visit: Payer: Self-pay | Admitting: Family Medicine

## 2017-05-23 DIAGNOSIS — I10 Essential (primary) hypertension: Secondary | ICD-10-CM

## 2017-05-24 DIAGNOSIS — I214 Non-ST elevation (NSTEMI) myocardial infarction: Secondary | ICD-10-CM

## 2017-05-24 HISTORY — DX: Non-ST elevation (NSTEMI) myocardial infarction: I21.4

## 2017-05-25 NOTE — Telephone Encounter (Signed)
2nd request.  Maddalena Linarez L, RN  

## 2017-05-26 ENCOUNTER — Ambulatory Visit (INDEPENDENT_AMBULATORY_CARE_PROVIDER_SITE_OTHER): Payer: Medicare Other

## 2017-05-26 ENCOUNTER — Other Ambulatory Visit: Payer: Self-pay | Admitting: Student in an Organized Health Care Education/Training Program

## 2017-05-26 DIAGNOSIS — R002 Palpitations: Secondary | ICD-10-CM

## 2017-05-26 MED ORDER — RIVAROXABAN 20 MG PO TABS: 20.0000 mg | ORAL_TABLET | Freq: Every day | ORAL | 3 refills | Status: DC

## 2017-05-26 NOTE — Telephone Encounter (Signed)
3rd request. Erlean Mealor L, RN  

## 2017-05-26 NOTE — Telephone Encounter (Signed)
Xarelto0 20 mg Qdaily refilled.

## 2017-05-29 ENCOUNTER — Emergency Department (HOSPITAL_COMMUNITY): Payer: Medicare Other

## 2017-05-29 ENCOUNTER — Encounter (HOSPITAL_COMMUNITY): Payer: Self-pay

## 2017-05-29 ENCOUNTER — Inpatient Hospital Stay (HOSPITAL_COMMUNITY)
Admission: EM | Admit: 2017-05-29 | Discharge: 2017-06-02 | DRG: 281 | Disposition: A | Discharge status: Home / Self Care | Payer: Medicare Other | Attending: Family Medicine | Admitting: Family Medicine

## 2017-05-29 DIAGNOSIS — K2970 Gastritis, unspecified, without bleeding: Secondary | ICD-10-CM | POA: Diagnosis present

## 2017-05-29 DIAGNOSIS — Z8249 Family history of ischemic heart disease and other diseases of the circulatory system: Secondary | ICD-10-CM

## 2017-05-29 DIAGNOSIS — G8929 Other chronic pain: Secondary | ICD-10-CM | POA: Diagnosis present

## 2017-05-29 DIAGNOSIS — F419 Anxiety disorder, unspecified: Secondary | ICD-10-CM | POA: Diagnosis present

## 2017-05-29 DIAGNOSIS — K922 Gastrointestinal hemorrhage, unspecified: Secondary | ICD-10-CM | POA: Diagnosis not present

## 2017-05-29 DIAGNOSIS — Z7982 Long term (current) use of aspirin: Secondary | ICD-10-CM

## 2017-05-29 DIAGNOSIS — I10 Essential (primary) hypertension: Secondary | ICD-10-CM | POA: Diagnosis not present

## 2017-05-29 DIAGNOSIS — K210 Gastro-esophageal reflux disease with esophagitis: Secondary | ICD-10-CM | POA: Diagnosis present

## 2017-05-29 DIAGNOSIS — Z8673 Personal history of transient ischemic attack (TIA), and cerebral infarction without residual deficits: Secondary | ICD-10-CM

## 2017-05-29 DIAGNOSIS — Z794 Long term (current) use of insulin: Secondary | ICD-10-CM | POA: Diagnosis not present

## 2017-05-29 DIAGNOSIS — I2510 Atherosclerotic heart disease of native coronary artery without angina pectoris: Secondary | ICD-10-CM | POA: Diagnosis present

## 2017-05-29 DIAGNOSIS — E876 Hypokalemia: Secondary | ICD-10-CM | POA: Diagnosis present

## 2017-05-29 DIAGNOSIS — I214 Non-ST elevation (NSTEMI) myocardial infarction: Principal | ICD-10-CM | POA: Diagnosis present

## 2017-05-29 DIAGNOSIS — Z841 Family history of disorders of kidney and ureter: Secondary | ICD-10-CM

## 2017-05-29 DIAGNOSIS — Z86718 Personal history of other venous thrombosis and embolism: Secondary | ICD-10-CM

## 2017-05-29 DIAGNOSIS — Z79891 Long term (current) use of opiate analgesic: Secondary | ICD-10-CM

## 2017-05-29 DIAGNOSIS — F17290 Nicotine dependence, other tobacco product, uncomplicated: Secondary | ICD-10-CM | POA: Diagnosis present

## 2017-05-29 DIAGNOSIS — Z7901 Long term (current) use of anticoagulants: Secondary | ICD-10-CM

## 2017-05-29 DIAGNOSIS — K7689 Other specified diseases of liver: Secondary | ICD-10-CM | POA: Diagnosis present

## 2017-05-29 DIAGNOSIS — I6529 Occlusion and stenosis of unspecified carotid artery: Secondary | ICD-10-CM | POA: Diagnosis present

## 2017-05-29 DIAGNOSIS — I70212 Atherosclerosis of native arteries of extremities with intermittent claudication, left leg: Secondary | ICD-10-CM | POA: Diagnosis present

## 2017-05-29 DIAGNOSIS — E118 Type 2 diabetes mellitus with unspecified complications: Secondary | ICD-10-CM | POA: Diagnosis not present

## 2017-05-29 DIAGNOSIS — K920 Hematemesis: Secondary | ICD-10-CM | POA: Diagnosis present

## 2017-05-29 DIAGNOSIS — M109 Gout, unspecified: Secondary | ICD-10-CM | POA: Diagnosis present

## 2017-05-29 DIAGNOSIS — K449 Diaphragmatic hernia without obstruction or gangrene: Secondary | ICD-10-CM | POA: Diagnosis present

## 2017-05-29 DIAGNOSIS — Z833 Family history of diabetes mellitus: Secondary | ICD-10-CM | POA: Diagnosis not present

## 2017-05-29 DIAGNOSIS — Z808 Family history of malignant neoplasm of other organs or systems: Secondary | ICD-10-CM | POA: Diagnosis not present

## 2017-05-29 DIAGNOSIS — I25110 Atherosclerotic heart disease of native coronary artery with unstable angina pectoris: Secondary | ICD-10-CM | POA: Diagnosis present

## 2017-05-29 DIAGNOSIS — Z800 Family history of malignant neoplasm of digestive organs: Secondary | ICD-10-CM | POA: Diagnosis not present

## 2017-05-29 DIAGNOSIS — N183 Chronic kidney disease, stage 3 (moderate): Secondary | ICD-10-CM | POA: Diagnosis present

## 2017-05-29 DIAGNOSIS — R918 Other nonspecific abnormal finding of lung field: Secondary | ICD-10-CM | POA: Diagnosis present

## 2017-05-29 DIAGNOSIS — F17210 Nicotine dependence, cigarettes, uncomplicated: Secondary | ICD-10-CM | POA: Diagnosis present

## 2017-05-29 DIAGNOSIS — R0789 Other chest pain: Secondary | ICD-10-CM | POA: Insufficient documentation

## 2017-05-29 DIAGNOSIS — E1151 Type 2 diabetes mellitus with diabetic peripheral angiopathy without gangrene: Secondary | ICD-10-CM | POA: Diagnosis present

## 2017-05-29 DIAGNOSIS — Z86711 Personal history of pulmonary embolism: Secondary | ICD-10-CM

## 2017-05-29 DIAGNOSIS — I5032 Chronic diastolic (congestive) heart failure: Secondary | ICD-10-CM | POA: Diagnosis present

## 2017-05-29 DIAGNOSIS — E871 Hypo-osmolality and hyponatremia: Secondary | ICD-10-CM | POA: Diagnosis present

## 2017-05-29 DIAGNOSIS — M545 Low back pain: Secondary | ICD-10-CM | POA: Diagnosis present

## 2017-05-29 DIAGNOSIS — I130 Hypertensive heart and chronic kidney disease with heart failure and stage 1 through stage 4 chronic kidney disease, or unspecified chronic kidney disease: Secondary | ICD-10-CM | POA: Diagnosis present

## 2017-05-29 DIAGNOSIS — E1142 Type 2 diabetes mellitus with diabetic polyneuropathy: Secondary | ICD-10-CM | POA: Diagnosis present

## 2017-05-29 DIAGNOSIS — I340 Nonrheumatic mitral (valve) insufficiency: Secondary | ICD-10-CM | POA: Diagnosis present

## 2017-05-29 DIAGNOSIS — Z79899 Other long term (current) drug therapy: Secondary | ICD-10-CM

## 2017-05-29 DIAGNOSIS — D124 Benign neoplasm of descending colon: Secondary | ICD-10-CM | POA: Diagnosis present

## 2017-05-29 DIAGNOSIS — M19071 Primary osteoarthritis, right ankle and foot: Secondary | ICD-10-CM | POA: Diagnosis present

## 2017-05-29 DIAGNOSIS — E1122 Type 2 diabetes mellitus with diabetic chronic kidney disease: Secondary | ICD-10-CM | POA: Diagnosis present

## 2017-05-29 DIAGNOSIS — M19072 Primary osteoarthritis, left ankle and foot: Secondary | ICD-10-CM | POA: Diagnosis present

## 2017-05-29 DIAGNOSIS — Z955 Presence of coronary angioplasty implant and graft: Secondary | ICD-10-CM

## 2017-05-29 DIAGNOSIS — Z8711 Personal history of peptic ulcer disease: Secondary | ICD-10-CM | POA: Diagnosis not present

## 2017-05-29 DIAGNOSIS — R079 Chest pain, unspecified: Secondary | ICD-10-CM | POA: Diagnosis present

## 2017-05-29 DIAGNOSIS — D500 Iron deficiency anemia secondary to blood loss (chronic): Secondary | ICD-10-CM | POA: Diagnosis present

## 2017-05-29 DIAGNOSIS — M1711 Unilateral primary osteoarthritis, right knee: Secondary | ICD-10-CM | POA: Diagnosis present

## 2017-05-29 DIAGNOSIS — E785 Hyperlipidemia, unspecified: Secondary | ICD-10-CM | POA: Diagnosis present

## 2017-05-29 DIAGNOSIS — D631 Anemia in chronic kidney disease: Secondary | ICD-10-CM | POA: Diagnosis present

## 2017-05-29 DIAGNOSIS — Z7902 Long term (current) use of antithrombotics/antiplatelets: Secondary | ICD-10-CM

## 2017-05-29 DIAGNOSIS — D125 Benign neoplasm of sigmoid colon: Secondary | ICD-10-CM | POA: Diagnosis present

## 2017-05-29 DIAGNOSIS — Z981 Arthrodesis status: Secondary | ICD-10-CM

## 2017-05-29 LAB — LIPID PANEL
CHOL/HDL RATIO: 3.6 ratio
Cholesterol: 167 mg/dL (ref 0–200)
HDL: 46 mg/dL (ref >40)
LDL Cholesterol: 89 mg/dL (ref 0–99)
Triglycerides: 162 mg/dL — ABNORMAL HIGH (ref <150)
VLDL: 32 mg/dL (ref 0–40)

## 2017-05-29 LAB — COMPREHENSIVE METABOLIC PANEL
ALBUMIN: 3.3 g/dL — AB (ref 3.5–5.0)
ALK PHOS: 101 U/L (ref 38–126)
ALT: 25 U/L (ref 14–54)
ANION GAP: 10 (ref 5–15)
AST: 83 U/L — ABNORMAL HIGH (ref 15–41)
BUN: 8 mg/dL (ref 6–20)
CO2: 22 mmol/L (ref 22–32)
Calcium: 9 mg/dL (ref 8.9–10.3)
Chloride: 99 mmol/L — ABNORMAL LOW (ref 101–111)
Creatinine, Ser: 1.2 mg/dL — ABNORMAL HIGH (ref 0.44–1.00)
GFR calc Af Amer: 56 mL/min — ABNORMAL LOW (ref >60)
GFR calc non Af Amer: 48 mL/min — ABNORMAL LOW (ref >60)
GLUCOSE: 252 mg/dL — AB (ref 65–99)
POTASSIUM: 3.5 mmol/L (ref 3.5–5.1)
SODIUM: 131 mmol/L — AB (ref 135–145)
Total Bilirubin: 0.4 mg/dL (ref 0.3–1.2)
Total Protein: 7 g/dL (ref 6.5–8.1)

## 2017-05-29 LAB — CBC
HCT: 33.3 % — ABNORMAL LOW (ref 36.0–46.0)
HEMOGLOBIN: 10.3 g/dL — AB (ref 12.0–15.0)
MCH: 21.4 pg — ABNORMAL LOW (ref 26.0–34.0)
MCHC: 30.9 g/dL (ref 30.0–36.0)
MCV: 69.2 fL — ABNORMAL LOW (ref 78.0–100.0)
Platelets: 467 10*3/uL — ABNORMAL HIGH (ref 150–400)
RBC: 4.81 MIL/uL (ref 3.87–5.11)
RDW: 14.6 % (ref 11.5–15.5)
WBC: 10.2 10*3/uL (ref 4.0–10.5)

## 2017-05-29 LAB — TYPE AND SCREEN
ABO/RH(D): A NEG
Antibody Screen: NEGATIVE

## 2017-05-29 LAB — LIPASE, BLOOD: Lipase: 20 U/L (ref 11–51)

## 2017-05-29 LAB — PROTIME-INR
INR: 1.55
PROTHROMBIN TIME: 18.7 s — AB (ref 11.4–15.2)

## 2017-05-29 LAB — MRSA PCR SCREENING: MRSA BY PCR: NEGATIVE

## 2017-05-29 LAB — GLUCOSE, CAPILLARY: GLUCOSE-CAPILLARY: 221 mg/dL — AB (ref 65–99)

## 2017-05-29 LAB — TROPONIN I: Troponin I: 8.11 ng/mL (ref <0.03)

## 2017-05-29 MED ORDER — AMITRIPTYLINE HCL 25 MG PO TABS: 25.0000 mg | ORAL_TABLET | Freq: Every day | ORAL | Status: DC

## 2017-05-29 MED ORDER — ONDANSETRON HCL 4 MG PO TABS: 4.0000 mg | ORAL_TABLET | Freq: Four times a day (QID) | ORAL | Status: DC | PRN

## 2017-05-29 MED ORDER — PANTOPRAZOLE SODIUM 40 MG IV SOLR: 40.0000 mg | Freq: Every day | INTRAVENOUS | Status: DC

## 2017-05-29 MED ORDER — PANTOPRAZOLE SODIUM 40 MG IV SOLR
40.0000 mg | Freq: Two times a day (BID) | INTRAVENOUS | Status: DC
  Administered 2017-05-29 – 2017-06-01 (×6): 40 mg via INTRAVENOUS
  Filled 2017-05-29 (×9): qty 40

## 2017-05-29 MED ORDER — CLONIDINE HCL 0.2 MG PO TABS
0.2000 mg | ORAL_TABLET | Freq: Three times a day (TID) | ORAL | Status: DC
  Administered 2017-05-30 – 2017-06-02 (×10): 0.2 mg via ORAL
  Filled 2017-05-29 (×10): qty 1

## 2017-05-29 MED ORDER — SODIUM CHLORIDE 0.9 % IV SOLN
INTRAVENOUS | Status: DC
  Administered 2017-05-29 – 2017-05-30 (×3): via INTRAVENOUS
  Administered 2017-05-31: 100 mL/h via INTRAVENOUS
  Administered 2017-05-31 – 2017-06-01 (×4): via INTRAVENOUS

## 2017-05-29 MED ORDER — INSULIN ASPART 100 UNIT/ML ~~LOC~~ SOLN: 0.0000 [IU] | Freq: Three times a day (TID) | SUBCUTANEOUS | Status: DC

## 2017-05-29 MED ORDER — GABAPENTIN 600 MG PO TABS
300.0000 mg | ORAL_TABLET | Freq: Three times a day (TID) | ORAL | Status: DC
  Administered 2017-05-29 – 2017-06-02 (×11): 300 mg via ORAL
  Filled 2017-05-29 (×11): qty 1

## 2017-05-29 MED ORDER — AMITRIPTYLINE HCL 25 MG PO TABS: 25.0000 mg | ORAL_TABLET | ORAL | Status: DC

## 2017-05-29 MED ORDER — SODIUM CHLORIDE 0.9 % IV BOLUS (SEPSIS)
1000.0000 mL | Freq: Once | INTRAVENOUS | Status: AC
  Administered 2017-05-29: 1000 mL via INTRAVENOUS

## 2017-05-29 MED ORDER — DOCUSATE SODIUM 100 MG PO CAPS
100.0000 mg | ORAL_CAPSULE | Freq: Two times a day (BID) | ORAL | Status: DC
  Administered 2017-05-31 – 2017-06-02 (×4): 100 mg via ORAL
  Filled 2017-05-29 (×5): qty 1

## 2017-05-29 MED ORDER — METOPROLOL TARTRATE 12.5 MG HALF TABLET
12.5000 mg | ORAL_TABLET | Freq: Two times a day (BID) | ORAL | Status: DC
  Administered 2017-05-29 – 2017-06-02 (×8): 12.5 mg via ORAL
  Filled 2017-05-29 (×8): qty 1

## 2017-05-29 MED ORDER — AMITRIPTYLINE HCL 25 MG PO TABS
25.0000 mg | ORAL_TABLET | Freq: Every day | ORAL | Status: DC
  Administered 2017-05-30 – 2017-06-02 (×4): 25 mg via ORAL
  Filled 2017-05-29 (×4): qty 1

## 2017-05-29 MED ORDER — AMLODIPINE BESYLATE 10 MG PO TABS: 10.0000 mg | ORAL_TABLET | Freq: Every day | ORAL | Status: DC

## 2017-05-29 MED ORDER — SODIUM CHLORIDE 0.9% FLUSH
3.0000 mL | Freq: Two times a day (BID) | INTRAVENOUS | Status: DC
  Administered 2017-05-29 – 2017-05-31 (×3): 3 mL via INTRAVENOUS

## 2017-05-29 MED ORDER — ASPIRIN 81 MG PO CHEW
81.0000 mg | CHEWABLE_TABLET | Freq: Every day | ORAL | Status: DC
  Administered 2017-05-30 – 2017-06-02 (×4): 81 mg via ORAL
  Filled 2017-05-29 (×4): qty 1

## 2017-05-29 MED ORDER — NITROGLYCERIN IN D5W 200-5 MCG/ML-% IV SOLN
0.0000 ug/min | INTRAVENOUS | Status: DC
  Administered 2017-05-29: 5 ug/min via INTRAVENOUS
  Administered 2017-05-31: 15 ug/min via INTRAVENOUS
  Filled 2017-05-29 (×2): qty 250

## 2017-05-29 MED ORDER — ACETAMINOPHEN 325 MG PO TABS
650.0000 mg | ORAL_TABLET | Freq: Four times a day (QID) | ORAL | Status: DC | PRN
  Administered 2017-05-31 – 2017-06-01 (×2): 650 mg via ORAL
  Filled 2017-05-29 (×2): qty 2

## 2017-05-29 MED ORDER — ONDANSETRON HCL 4 MG/2ML IJ SOLN: 4.0000 mg | Freq: Four times a day (QID) | INTRAMUSCULAR | Status: DC | PRN

## 2017-05-29 MED ORDER — OXYCODONE HCL 5 MG PO TABS
5.0000 mg | ORAL_TABLET | Freq: Three times a day (TID) | ORAL | Status: DC | PRN
  Administered 2017-06-02: 5 mg via ORAL
  Filled 2017-05-29: qty 1

## 2017-05-29 MED ORDER — ISOSORBIDE MONONITRATE ER 30 MG PO TB24
30.0000 mg | ORAL_TABLET | Freq: Every day | ORAL | Status: DC
  Administered 2017-05-29 – 2017-05-30 (×2): 30 mg via ORAL
  Filled 2017-05-29 (×2): qty 1

## 2017-05-29 MED ORDER — OXYCODONE-ACETAMINOPHEN 5-325 MG PO TABS
1.0000 | ORAL_TABLET | Freq: Three times a day (TID) | ORAL | Status: DC | PRN
  Administered 2017-05-30 – 2017-06-02 (×9): 1 via ORAL
  Filled 2017-05-29 (×9): qty 1

## 2017-05-29 MED ORDER — ONDANSETRON HCL 4 MG/2ML IJ SOLN
4.0000 mg | Freq: Once | INTRAMUSCULAR | Status: AC
  Administered 2017-05-29: 4 mg via INTRAVENOUS
  Filled 2017-05-29: qty 2

## 2017-05-29 MED ORDER — GI COCKTAIL ~~LOC~~
30.0000 mL | Freq: Once | ORAL | Status: AC
  Administered 2017-05-29: 30 mL via ORAL
  Filled 2017-05-29: qty 30

## 2017-05-29 MED ORDER — OXYCODONE-ACETAMINOPHEN 5-325 MG PO TABS
1.0000 | ORAL_TABLET | Freq: Once | ORAL | Status: AC
  Administered 2017-05-29: 1 via ORAL
  Filled 2017-05-29: qty 1

## 2017-05-29 MED ORDER — FAMOTIDINE IN NACL 20-0.9 MG/50ML-% IV SOLN
20.0000 mg | Freq: Once | INTRAVENOUS | Status: AC
  Administered 2017-05-29: 20 mg via INTRAVENOUS
  Filled 2017-05-29: qty 50

## 2017-05-29 MED ORDER — ALPRAZOLAM 0.5 MG PO TABS
0.5000 mg | ORAL_TABLET | Freq: Every day | ORAL | Status: DC | PRN
  Administered 2017-05-30 – 2017-05-31 (×2): 0.5 mg via ORAL
  Filled 2017-05-29 (×2): qty 1

## 2017-05-29 MED ORDER — CLOPIDOGREL BISULFATE 75 MG PO TABS
75.0000 mg | ORAL_TABLET | Freq: Every day | ORAL | Status: DC
  Administered 2017-05-30 – 2017-06-02 (×4): 75 mg via ORAL
  Filled 2017-05-29 (×4): qty 1

## 2017-05-29 MED ORDER — AMITRIPTYLINE HCL 50 MG PO TABS
50.0000 mg | ORAL_TABLET | Freq: Every day | ORAL | Status: DC
  Administered 2017-05-29 – 2017-06-01 (×4): 50 mg via ORAL
  Filled 2017-05-29 (×5): qty 1

## 2017-05-29 MED ORDER — PANTOPRAZOLE SODIUM 40 MG PO TBEC: 40.0000 mg | DELAYED_RELEASE_TABLET | Freq: Every day | ORAL | Status: DC

## 2017-05-29 MED ORDER — INSULIN ASPART 100 UNIT/ML ~~LOC~~ SOLN
0.0000 [IU] | Freq: Every day | SUBCUTANEOUS | Status: DC
  Administered 2017-05-29: 2 [IU] via SUBCUTANEOUS

## 2017-05-29 MED ORDER — PANTOPRAZOLE SODIUM 40 MG IV SOLR
40.0000 mg | Freq: Two times a day (BID) | INTRAVENOUS | Status: DC
Start: 2017-05-29 — End: 2017-05-29

## 2017-05-29 MED ORDER — OXYCODONE-ACETAMINOPHEN 10-325 MG PO TABS: 1.0000 | ORAL_TABLET | Freq: Three times a day (TID) | ORAL | Status: DC | PRN

## 2017-05-29 MED ORDER — ACETAMINOPHEN 650 MG RE SUPP: 650.0000 mg | Freq: Four times a day (QID) | RECTAL | Status: DC | PRN

## 2017-05-29 MED ORDER — ROSUVASTATIN CALCIUM 20 MG PO TABS
40.0000 mg | ORAL_TABLET | Freq: Every day | ORAL | Status: DC
  Administered 2017-05-29 – 2017-06-01 (×4): 40 mg via ORAL
  Filled 2017-05-29 (×5): qty 2
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
## 2017-05-29 NOTE — ED Notes (Signed)
Admitting at bedside 
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
## 2017-05-29 NOTE — ED Notes (Signed)
Cardiology at bedside.
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
## 2017-05-29 NOTE — ED Notes (Signed)
Date and time results received: 05/29/17  1:06 PM   Test: Troponin Critical Value: 8.11  Name of Provider Notified: Dr. Vanita Panda  Orders Received? Or Actions Taken?: no further orders will continue to monitor

## 2017-05-29 NOTE — ED Notes (Signed)
GI at bedside

## 2017-05-29 NOTE — ED Triage Notes (Signed)
Pt presents to the ed with complaints of substernal chest pain that feels stabbing in nature. Pt also complains of vomiting up blood this morning. She received 3 nitro and 4 of morphine IV prior to arrival bringing her pain from a 7/10 to a 4/10. Denies any shortness of breath. Pt has a monitor on because she was just admitted for chest pain.

## 2017-05-29 NOTE — ED Notes (Signed)
Patient transported to X-ray 

## 2017-05-30 ENCOUNTER — Inpatient Hospital Stay (HOSPITAL_COMMUNITY): Payer: Medicare Other | Admitting: Anesthesiology

## 2017-05-30 ENCOUNTER — Encounter (HOSPITAL_COMMUNITY)
Admission: EM | Disposition: A | Discharge status: Home / Self Care | Payer: Self-pay | Source: Home / Self Care | Attending: Family Medicine

## 2017-05-30 ENCOUNTER — Inpatient Hospital Stay (HOSPITAL_COMMUNITY): Payer: Medicare Other

## 2017-05-30 ENCOUNTER — Encounter (HOSPITAL_COMMUNITY): Payer: Self-pay

## 2017-05-30 DIAGNOSIS — I214 Non-ST elevation (NSTEMI) myocardial infarction: Principal | ICD-10-CM

## 2017-05-30 DIAGNOSIS — I340 Nonrheumatic mitral (valve) insufficiency: Secondary | ICD-10-CM

## 2017-05-30 HISTORY — PX: ESOPHAGOGASTRODUODENOSCOPY: SHX5428

## 2017-05-30 LAB — BASIC METABOLIC PANEL
ANION GAP: 14 (ref 5–15)
ANION GAP: 11 (ref 5–15)
BUN: 7 mg/dL (ref 6–20)
BUN: 5 mg/dL — ABNORMAL LOW (ref 6–20)
CALCIUM: 9 mg/dL (ref 8.9–10.3)
CHLORIDE: 101 mmol/L (ref 101–111)
CHLORIDE: 103 mmol/L (ref 101–111)
CO2: 19 mmol/L — AB (ref 22–32)
CO2: 20 mmol/L — AB (ref 22–32)
CREATININE: 1.02 mg/dL — AB (ref 0.44–1.00)
Calcium: 8.8 mg/dL — ABNORMAL LOW (ref 8.9–10.3)
Creatinine, Ser: 1.08 mg/dL — ABNORMAL HIGH (ref 0.44–1.00)
GFR calc non Af Amer: 59 mL/min — ABNORMAL LOW (ref >60)
GFR calc non Af Amer: 55 mL/min — ABNORMAL LOW (ref >60)
Glucose, Bld: 268 mg/dL — ABNORMAL HIGH (ref 65–99)
Glucose, Bld: 220 mg/dL — ABNORMAL HIGH (ref 65–99)
Potassium: 2.9 mmol/L — ABNORMAL LOW (ref 3.5–5.1)
Potassium: 3.5 mmol/L (ref 3.5–5.1)
SODIUM: 134 mmol/L — AB (ref 135–145)
Sodium: 134 mmol/L — ABNORMAL LOW (ref 135–145)

## 2017-05-30 LAB — TSH: TSH: 0.219 u[IU]/mL — ABNORMAL LOW (ref 0.350–4.500)

## 2017-05-30 LAB — T4, FREE: FREE T4: 0.96 ng/dL (ref 0.61–1.12)

## 2017-05-30 LAB — CBC
HCT: 36.4 % (ref 36.0–46.0)
HEMOGLOBIN: 11.5 g/dL — AB (ref 12.0–15.0)
MCH: 21.7 pg — AB (ref 26.0–34.0)
MCHC: 31.6 g/dL (ref 30.0–36.0)
MCV: 68.7 fL — ABNORMAL LOW (ref 78.0–100.0)
PLATELETS: 396 10*3/uL (ref 150–400)
RBC: 5.3 MIL/uL — AB (ref 3.87–5.11)
RDW: 14.6 % (ref 11.5–15.5)
WBC: 14.4 10*3/uL — AB (ref 4.0–10.5)

## 2017-05-30 LAB — GLUCOSE, CAPILLARY
GLUCOSE-CAPILLARY: 179 mg/dL — AB (ref 65–99)
GLUCOSE-CAPILLARY: 213 mg/dL — AB (ref 65–99)
Glucose-Capillary: 209 mg/dL — ABNORMAL HIGH (ref 65–99)
Glucose-Capillary: 268 mg/dL — ABNORMAL HIGH (ref 65–99)
Glucose-Capillary: 303 mg/dL — ABNORMAL HIGH (ref 65–99)
Glucose-Capillary: 313 mg/dL — ABNORMAL HIGH (ref 65–99)

## 2017-05-30 LAB — TROPONIN I
TROPONIN I: 18.56 ng/mL — AB (ref <0.03)
TROPONIN I: 21.62 ng/mL — AB (ref <0.03)
Troponin I: 21.3 ng/mL (ref <0.03)

## 2017-05-30 LAB — HEMOGLOBIN AND HEMATOCRIT, BLOOD
HEMATOCRIT: 36.7 % (ref 36.0–46.0)
Hemoglobin: 11.3 g/dL — ABNORMAL LOW (ref 12.0–15.0)

## 2017-05-30 LAB — ECHOCARDIOGRAM COMPLETE
HEIGHTINCHES: 63 in
Weight: 2084.67 oz

## 2017-05-30 LAB — HIV ANTIBODY (ROUTINE TESTING W REFLEX): HIV SCREEN 4TH GENERATION: NONREACTIVE

## 2017-05-30 SURGERY — EGD (ESOPHAGOGASTRODUODENOSCOPY)
Anesthesia: Moderate Sedation

## 2017-05-30 SURGERY — EGD (ESOPHAGOGASTRODUODENOSCOPY)
Anesthesia: Monitor Anesthesia Care

## 2017-05-30 MED ORDER — ONDANSETRON HCL 4 MG/2ML IJ SOLN
INTRAMUSCULAR | Status: DC | PRN
  Administered 2017-05-30: 4 mg via INTRAVENOUS

## 2017-05-30 MED ORDER — POTASSIUM CHLORIDE 10 MEQ/100ML IV SOLN
10.0000 meq | INTRAVENOUS | Status: AC
  Administered 2017-05-30 (×4): 10 meq via INTRAVENOUS
  Filled 2017-05-30 (×2): qty 100

## 2017-05-30 MED ORDER — PROPOFOL 10 MG/ML IV BOLUS
INTRAVENOUS | Status: DC | PRN
  Administered 2017-05-30: 20 mg via INTRAVENOUS
  Administered 2017-05-30: 30 mg via INTRAVENOUS
  Administered 2017-05-30: 20 mg via INTRAVENOUS

## 2017-05-30 MED ORDER — LIDOCAINE HCL (CARDIAC) 20 MG/ML IV SOLN
INTRAVENOUS | Status: DC | PRN
  Administered 2017-05-30: 50 mg via INTRATRACHEAL

## 2017-05-30 MED ORDER — INSULIN ASPART 100 UNIT/ML ~~LOC~~ SOLN
0.0000 [IU] | SUBCUTANEOUS | Status: DC
  Administered 2017-05-30: 2 [IU] via SUBCUTANEOUS
  Administered 2017-05-30: 7 [IU] via SUBCUTANEOUS
  Administered 2017-05-30: 3 [IU] via SUBCUTANEOUS
  Administered 2017-05-30: 7 [IU] via SUBCUTANEOUS
  Administered 2017-05-31: 2 [IU] via SUBCUTANEOUS
  Administered 2017-05-31: 7 [IU] via SUBCUTANEOUS

## 2017-05-30 MED ORDER — SODIUM CHLORIDE 0.9 % IV SOLN
INTRAVENOUS | Status: DC | PRN
  Administered 2017-05-30: 10:00:00 via INTRAVENOUS

## 2017-05-30 NOTE — Anesthesia Postprocedure Evaluation (Signed)
Anesthesia Post Note  Patient: Alejandra Hernandez  Procedure(s) Performed: Procedure(s) (LRB): ESOPHAGOGASTRODUODENOSCOPY (EGD) (N/A)     Patient location during evaluation: PACU Anesthesia Type: MAC Level of consciousness: awake and alert Pain management: pain level controlled Vital Signs Assessment: post-procedure vital signs reviewed and stable Respiratory status: spontaneous breathing Cardiovascular status: stable Anesthetic complications: no    Last Vitals:  Vitals:   05/30/17 1110 05/30/17 1200  BP: (!) 144/41 (!) 153/61  Pulse: 87 72  Resp: 16 19  Temp:      Last Pain:  Vitals:   05/30/17 1057  TempSrc: Oral  PainSc:                  [name]

## 2017-05-30 NOTE — Transfer of Care (Signed)
Immediate Anesthesia Transfer of Care Note  Patient: Alejandra Hernandez  Procedure(s) Performed: Procedure(s): ESOPHAGOGASTRODUODENOSCOPY (EGD) (N/A)  Patient Location: Endoscopy Unit  Anesthesia Type:MAC  Level of Consciousness: awake, oriented, sedated, patient cooperative and responds to stimulation  Airway & Oxygen Therapy: Patient Spontanous Breathing and Patient connected to nasal cannula oxygen  Post-op Assessment: Report given to RN, Post -op Vital signs reviewed and stable, Patient moving all extremities and Patient moving all extremities X 4  Post vital signs: Reviewed and stable  Last Vitals:  Vitals:   05/30/17 0830 05/30/17 0950  BP: (!) 145/75 (!) 152/66  Pulse: 91 80  Resp: 18 17  Temp: 36.8 C 37 C    Last Pain:  Vitals:   05/30/17 0950  TempSrc: Oral  PainSc:          Complications: No apparent anesthesia complications

## 2017-05-30 NOTE — Progress Notes (Signed)
CRITICAL VALUE ALERT  Critical Value: Trop 21.3  Date & Time Notied:  05/30/2017 0020  Provider Notified: Dr. Reesa Chew  Orders Received/Actions taken: Repeat stat Trop
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
## 2017-05-30 NOTE — Op Note (Signed)
[facility] Patient Name: Alejandra Hernandez Procedure Date : 05/30/2017 MRN: [id number] Attending MD: Juanita Craver , MD Date of Birth: 25-Aug-1956 CSN: [id number] Age: 61 Admit Type: Inpatient Procedure:                Diagnostic EGD. Indications:              Hematemesis on anticoagulants-NSTEMI with possible                            dstent occlusion-DES placed on 05/19/17. Providers:                Juanita Craver, MD, Zenon Mayo, RN, Alan Mulder,                            Technician, Jacquiline Doe, CRNA, [name],                            Anesthesiologist. Referring MD:             Minus Breeding, MD Medicines:                Monitored Anesthesia Care Complications:            No immediate complications. Estimated Blood Loss:     Estimated blood loss: none. Procedure:                Pre-anesthesia assessment: - Prior to the                            procedure, a history and physical was performed,                            and patient medications and allergies were                            reviewed. The patient's tolerance of previous                            anesthesia was also reviewed. The risks and                            benefits of the procedure and the sedation options                            and risks were discussed with the patient and her                            son Alejandra Hernandez the phone; please also see my                            note from today-a detailed discussion of the riskd                            and benefits were discussed with Dr. Lissa Hoard as  well. All questions were answered, and informed                            consent was obtained. Prior anticoagulants: The                            patient has taken Xarelto and ASspirin, last dose                            was 1 day prior to procedure. ASA Grade assessment:                            IV - A patient with severe systemic disease that  is                            a constant threat to life. After reviewing the                            risks and benefits, the patient was deemed in                            satisfactory condition to undergo the procedure.                            After obtaining informed consent, the endoscope was                            passed under direct vision. Throughout the                            procedure, the patient's blood pressure, pulse, and                            oxygen saturations were monitored continuously. The                            [id number] ([id number]) scope was introduced through the                            mouth, and advanced to the second part of duodenum.                            The EGD was accomplished without difficulty. The                            patient tolerated the procedure well. Scope In: Scope Out: Findings:      LA Grade D (one or more mucosal breaks involving at least 75% of       esophageal circumference) esophagitis with no bleeding was found.      Patchy mild inflammation characterized by erosions was found in the       cardia.      A small hiatal hernia was noted on retroflexion.      The  examined duodenum was normal. Impression:               - LA Grade D reflux esophagitis.                           - Mild gastritis in proximal stomach.                           - Normal appearing midbody and gastric antrum.                           - Small hiatal hernia.                           - Normal examined duodenum.                           - No specimens collected. Moderate Sedation:      MAC given. Recommendation:           - Soft diet daily.                           - Continue present medications.                           - Proceed with a cardiac cath as planned.                           - Return to normal activities after studies are                            complete. Procedure Code(s):        --- Professional ---                            [phone], Esophagogastroduodenoscopy, flexible,                            transoral; diagnostic, including collection of                            specimen(s) by brushing or washing, when performed                            (separate procedure) Diagnosis Code(s):        --- Professional ---                           K92.0, Hematemesis                           K21.0, Gastro-esophageal reflux disease with                            esophagitis                           K29.70, Gastritis, unspecified, without bleeding  K44.9, Diaphragmatic hernia without obstruction or                            gangrene CPT copyright 2016 [employer]. All rights reserved. The codes documented in this report are preliminary and upon coder review may  be revised to meet current compliance requirements. Juanita Craver, MD Juanita Craver, MD 05/30/2017 10:54:29 AM This report has been signed electronically. Number of Addenda: 0

## 2017-05-30 NOTE — Progress Notes (Signed)
CRITICAL VALUE ALERT  Critical Value:  Trop 21.63  Date & Time Notied: 05/30/2017 at 02;00  Provider Notified: Dr. Reesa Chew  Orders Received/Actions taken: Dr. Reesa Chew is calling cardiology and will call me back, for orders for cath or Endoscopy first.
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
## 2017-05-30 NOTE — Anesthesia Procedure Notes (Signed)
Procedure Name: MAC Date/Time: 05/30/2017 10:25 AM Performed by: Jacquiline Doe A Pre-anesthesia Checklist: Patient identified, Emergency Drugs available, Suction available and Patient being monitored Oxygen Delivery Method: Nasal cannula Intubation Type: IV induction Placement Confirmation: positive ETCO2 Dental Injury: Teeth and Oropharynx as per pre-operative assessment

## 2017-05-30 NOTE — Progress Notes (Signed)
  Echocardiogram 2D Echocardiogram has been performed.  Alejandra Hernandez Alejandra Hernandez 05/30/2017, 8:40 AM

## 2017-05-30 NOTE — Anesthesia Preprocedure Evaluation (Addendum)
Anesthesia Evaluation  Patient identified by MRN, date of birth, ID band Patient awake    Reviewed: Allergy & Precautions, Patient's Chart, lab work & pertinent test results  Airway Mallampati: III  TM Distance: <3 FB Neck ROM: Full    Dental  (+) Edentulous Upper   Pulmonary former smoker,    breath sounds clear to auscultation       Cardiovascular hypertension, + angina + CAD, + Past MI, + Peripheral Vascular Disease and +CHF   Rhythm:Regular  Echo 05/30/2017:  I reviewed images. Likely mild MR, preserved LVSF with no obvious WMA. EF ~60%   Neuro/Psych  Headaches, Anxiety  Neuromuscular disease    GI/Hepatic PUD, GERD  Controlled,  Endo/Other  diabetes  Renal/GU Renal InsufficiencyRenal disease     Musculoskeletal  (+) Arthritis ,   Abdominal   Peds  Hematology  (+) anemia ,   Anesthesia Other Findings   Reproductive/Obstetrics                            Anesthesia Physical  Anesthesia Plan  ASA: IV and emergent  Anesthesia Plan: MAC   Post-op Pain Management:    Induction: Intravenous  PONV Risk Score and Plan: 2 and Ondansetron and Propofol  Airway Management Planned: Natural Airway and Simple Face Mask  Additional Equipment:   Intra-op Plan:   Post-operative Plan:   Informed Consent:   Dental advisory given  Plan Discussed with: CRNA  Anesthesia Plan Comments: (Pt admitted for NSTEMI, possible in stent thrombosis given she is S/P DES 6/25. )       Anesthesia Quick Evaluation

## 2017-05-31 LAB — CBC
HEMATOCRIT: 29.4 % — AB (ref 36.0–46.0)
HEMOGLOBIN: 9 g/dL — AB (ref 12.0–15.0)
MCH: 21 pg — AB (ref 26.0–34.0)
MCHC: 30.3 g/dL (ref 30.0–36.0)
MCV: 69.5 fL — AB (ref 78.0–100.0)
Platelets: 373 10*3/uL (ref 150–400)
RBC: 4.23 MIL/uL (ref 3.87–5.11)
RDW: 14.6 % (ref 11.5–15.5)
WBC: 13.3 10*3/uL — ABNORMAL HIGH (ref 4.0–10.5)

## 2017-05-31 LAB — BASIC METABOLIC PANEL
ANION GAP: 8 (ref 5–15)
BUN: 9 mg/dL (ref 6–20)
CHLORIDE: 107 mmol/L (ref 101–111)
CO2: 19 mmol/L — AB (ref 22–32)
Calcium: 8.2 mg/dL — ABNORMAL LOW (ref 8.9–10.3)
Creatinine, Ser: 1.28 mg/dL — ABNORMAL HIGH (ref 0.44–1.00)
GFR calc non Af Amer: 45 mL/min — ABNORMAL LOW (ref >60)
GFR, EST AFRICAN AMERICAN: 52 mL/min — AB (ref >60)
Glucose, Bld: 122 mg/dL — ABNORMAL HIGH (ref 65–99)
POTASSIUM: 3.2 mmol/L — AB (ref 3.5–5.1)
Sodium: 134 mmol/L — ABNORMAL LOW (ref 135–145)

## 2017-05-31 LAB — GLUCOSE, CAPILLARY
GLUCOSE-CAPILLARY: 197 mg/dL — AB (ref 65–99)
GLUCOSE-CAPILLARY: 271 mg/dL — AB (ref 65–99)
GLUCOSE-CAPILLARY: 309 mg/dL — AB (ref 65–99)
Glucose-Capillary: 123 mg/dL — ABNORMAL HIGH (ref 65–99)
Glucose-Capillary: 181 mg/dL — ABNORMAL HIGH (ref 65–99)

## 2017-05-31 LAB — HEMOGLOBIN A1C
Hgb A1c MFr Bld: 12.8 % — ABNORMAL HIGH (ref 4.8–5.6)
Mean Plasma Glucose: 321 mg/dL

## 2017-05-31 MED ORDER — INSULIN ASPART 100 UNIT/ML ~~LOC~~ SOLN
0.0000 [IU] | SUBCUTANEOUS | Status: DC
Start: 2017-05-31 — End: 2017-06-02
  Administered 2017-05-31 – 2017-06-01 (×2): 7 [IU] via SUBCUTANEOUS
  Administered 2017-06-01: 1 [IU] via SUBCUTANEOUS
  Administered 2017-06-01 – 2017-06-02 (×3): 3 [IU] via SUBCUTANEOUS
  Administered 2017-06-02: 1 [IU] via SUBCUTANEOUS

## 2017-05-31 MED ORDER — SUCRALFATE 1 GM/10ML PO SUSP
1.0000 g | Freq: Three times a day (TID) | ORAL | Status: DC
  Administered 2017-05-31 – 2017-06-02 (×7): 1 g via ORAL
  Filled 2017-05-31 (×11): qty 10

## 2017-05-31 MED ORDER — POTASSIUM CHLORIDE CRYS ER 20 MEQ PO TBCR
60.0000 meq | EXTENDED_RELEASE_TABLET | Freq: Once | ORAL | Status: AC
  Administered 2017-05-31: 60 meq via ORAL
  Filled 2017-05-31: qty 3

## 2017-05-31 MED ORDER — INSULIN ASPART 100 UNIT/ML ~~LOC~~ SOLN
0.0000 [IU] | Freq: Three times a day (TID) | SUBCUTANEOUS | Status: DC
  Administered 2017-05-31: 2 [IU] via SUBCUTANEOUS

## 2017-05-31 MED ORDER — POTASSIUM CHLORIDE CRYS ER 20 MEQ PO TBCR
40.0000 meq | EXTENDED_RELEASE_TABLET | Freq: Once | ORAL | Status: AC
  Administered 2017-05-31: 40 meq via ORAL
  Filled 2017-05-31: qty 2

## 2017-05-31 MED ORDER — WHITE PETROLATUM GEL
Status: AC
  Administered 2017-05-31: 10:00:00
  Filled 2017-05-31: qty 1

## 2017-05-31 MED ORDER — INSULIN GLARGINE 100 UNIT/ML ~~LOC~~ SOLN
15.0000 [IU] | Freq: Two times a day (BID) | SUBCUTANEOUS | Status: DC
  Administered 2017-05-31: 15 [IU] via SUBCUTANEOUS
  Filled 2017-05-31: qty 0.15
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
## 2017-06-01 ENCOUNTER — Encounter (HOSPITAL_COMMUNITY)
Admission: EM | Disposition: A | Discharge status: Home / Self Care | Payer: Self-pay | Source: Home / Self Care | Attending: Family Medicine

## 2017-06-01 ENCOUNTER — Encounter (HOSPITAL_COMMUNITY): Payer: Self-pay | Admitting: Cardiology

## 2017-06-01 DIAGNOSIS — I2510 Atherosclerotic heart disease of native coronary artery without angina pectoris: Secondary | ICD-10-CM

## 2017-06-01 DIAGNOSIS — R079 Chest pain, unspecified: Secondary | ICD-10-CM

## 2017-06-01 HISTORY — PX: LEFT HEART CATH AND CORONARY ANGIOGRAPHY: CATH118249

## 2017-06-01 LAB — BASIC METABOLIC PANEL
Anion gap: 6 (ref 5–15)
BUN: 10 mg/dL (ref 6–20)
CALCIUM: 8.2 mg/dL — AB (ref 8.9–10.3)
CO2: 18 mmol/L — AB (ref 22–32)
CREATININE: 1.26 mg/dL — AB (ref 0.44–1.00)
Chloride: 116 mmol/L — ABNORMAL HIGH (ref 101–111)
GFR calc Af Amer: 53 mL/min — ABNORMAL LOW (ref >60)
GFR calc non Af Amer: 45 mL/min — ABNORMAL LOW (ref >60)
GLUCOSE: 138 mg/dL — AB (ref 65–99)
Potassium: 4.2 mmol/L (ref 3.5–5.1)
Sodium: 140 mmol/L (ref 135–145)

## 2017-06-01 LAB — CBC
HCT: 28.4 % — ABNORMAL LOW (ref 36.0–46.0)
Hemoglobin: 8.6 g/dL — ABNORMAL LOW (ref 12.0–15.0)
MCH: 21.6 pg — AB (ref 26.0–34.0)
MCHC: 30.3 g/dL (ref 30.0–36.0)
MCV: 71.2 fL — ABNORMAL LOW (ref 78.0–100.0)
Platelets: 363 10*3/uL (ref 150–400)
RBC: 3.99 MIL/uL (ref 3.87–5.11)
RDW: 15.3 % (ref 11.5–15.5)
WBC: 13.9 10*3/uL — ABNORMAL HIGH (ref 4.0–10.5)

## 2017-06-01 LAB — GLUCOSE, CAPILLARY
Glucose-Capillary: 153 mg/dL — ABNORMAL HIGH (ref 65–99)
Glucose-Capillary: 107 mg/dL — ABNORMAL HIGH (ref 65–99)
Glucose-Capillary: 86 mg/dL (ref 65–99)
Glucose-Capillary: 77 mg/dL (ref 65–99)
Glucose-Capillary: 254 mg/dL — ABNORMAL HIGH (ref 65–99)

## 2017-06-01 SURGERY — LEFT HEART CATH AND CORONARY ANGIOGRAPHY
Anesthesia: LOCAL

## 2017-06-01 MED ORDER — SODIUM CHLORIDE 0.9% FLUSH
3.0000 mL | INTRAVENOUS | Status: DC | PRN
Start: 2017-06-01 — End: 2017-06-02

## 2017-06-01 MED ORDER — FENTANYL CITRATE (PF) 100 MCG/2ML IJ SOLN
INTRAMUSCULAR | Status: DC | PRN
  Administered 2017-06-01: 25 ug via INTRAVENOUS

## 2017-06-01 MED ORDER — SODIUM CHLORIDE 0.9 % WEIGHT BASED INFUSION: 1.0000 mL/kg/h | INTRAVENOUS | Status: AC

## 2017-06-01 MED ORDER — SODIUM CHLORIDE 0.9% FLUSH
3.0000 mL | Freq: Two times a day (BID) | INTRAVENOUS | Status: DC
  Administered 2017-06-01 – 2017-06-02 (×2): 3 mL via INTRAVENOUS

## 2017-06-01 MED ORDER — SODIUM CHLORIDE 0.9% FLUSH: 3.0000 mL | INTRAVENOUS | Status: DC | PRN

## 2017-06-01 MED ORDER — IOPAMIDOL (ISOVUE-370) INJECTION 76%
INTRAVENOUS | Status: DC | PRN
  Administered 2017-06-01: 30 mL via INTRA_ARTERIAL

## 2017-06-01 MED ORDER — SODIUM CHLORIDE 0.9% FLUSH: 3.0000 mL | Freq: Two times a day (BID) | INTRAVENOUS | Status: DC

## 2017-06-01 MED ORDER — IOPAMIDOL (ISOVUE-370) INJECTION 76%
INTRAVENOUS | Status: AC
  Filled 2017-06-01: qty 100

## 2017-06-01 MED ORDER — HEPARIN (PORCINE) IN NACL 2-0.9 UNIT/ML-% IJ SOLN
INTRAMUSCULAR | Status: AC
  Filled 2017-06-01: qty 500

## 2017-06-01 MED ORDER — HEPARIN (PORCINE) IN NACL 2-0.9 UNIT/ML-% IJ SOLN
INTRAMUSCULAR | Status: AC
  Filled 2017-06-01: qty 1000

## 2017-06-01 MED ORDER — HEPARIN (PORCINE) IN NACL 2-0.9 UNIT/ML-% IJ SOLN
INTRAMUSCULAR | Status: AC | PRN
  Administered 2017-06-01: 1000 mL

## 2017-06-01 MED ORDER — FENTANYL CITRATE (PF) 100 MCG/2ML IJ SOLN
INTRAMUSCULAR | Status: AC
  Filled 2017-06-01: qty 2

## 2017-06-01 MED ORDER — ASPIRIN 81 MG PO CHEW: 81.0000 mg | CHEWABLE_TABLET | ORAL | Status: DC

## 2017-06-01 MED ORDER — HEPARIN (PORCINE) IN NACL 2-0.9 UNIT/ML-% IJ SOLN
INTRAMUSCULAR | Status: AC | PRN
  Administered 2017-06-01: 500 mL

## 2017-06-01 MED ORDER — MIDAZOLAM HCL 2 MG/2ML IJ SOLN
INTRAMUSCULAR | Status: AC
  Filled 2017-06-01: qty 2

## 2017-06-01 MED ORDER — SODIUM CHLORIDE 0.9 % IV SOLN: INTRAVENOUS | Status: DC

## 2017-06-01 MED ORDER — SODIUM CHLORIDE 0.9 % IV SOLN: 250.0000 mL | INTRAVENOUS | Status: DC | PRN

## 2017-06-01 MED ORDER — MIDAZOLAM HCL 2 MG/2ML IJ SOLN
INTRAMUSCULAR | Status: DC | PRN
  Administered 2017-06-01: 1 mg via INTRAVENOUS

## 2017-06-01 MED ORDER — LIDOCAINE HCL (PF) 1 % IJ SOLN
INTRAMUSCULAR | Status: DC | PRN
  Administered 2017-06-01: 20 mL

## 2017-06-01 MED ORDER — HEPARIN SODIUM (PORCINE) 1000 UNIT/ML IJ SOLN
INTRAMUSCULAR | Status: AC
  Filled 2017-06-01: qty 1

## 2017-06-01 MED ORDER — LIDOCAINE HCL (PF) 1 % IJ SOLN
INTRAMUSCULAR | Status: AC
  Filled 2017-06-01: qty 30

## 2017-06-01 SURGICAL SUPPLY — 6 items
CATH INFINITI 5FR MULTPACK ANG (CATHETERS) ×2 IMPLANT
KIT HEART LEFT (KITS) ×2 IMPLANT
PACK CARDIAC CATHETERIZATION (CUSTOM PROCEDURE TRAY) ×2 IMPLANT
SHEATH PINNACLE 5F 10CM (SHEATH) ×2 IMPLANT
TRANSDUCER W/STOPCOCK (MISCELLANEOUS) ×2 IMPLANT
WIRE EMERALD 3MM-J .035X150CM (WIRE) ×2 IMPLANT

## 2017-06-01 NOTE — Progress Notes (Signed)
For cath today.   Hgb has dropped to 8.6 today from 11.5 on 7/7 after start ASA and Plavix. Xaralto on hold for cath. Here with GI bleed. GED 7/7 showed Grade IV esophagitis/gastrtis. GI on board. Follow closely.   ATTENDING ATTESTATION With now evidence of occluded distal circumflex with no plans for PCI based on hemoglobin drop and small size vessel, and continue to hold Xarelto for now especially in light of her being on aspirin and Plavix for her recent stent.  If necessary, we can stop aspirin.  With her elevated troponin level, would try to keep hemoglobin level above 9 to avoid excess cardiac strain.  Glenetta Hew, MD
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
## 2017-06-01 NOTE — Progress Notes (Signed)
Inpatient Diabetes Program Recommendations  AACE/ADA: New Consensus Statement on Inpatient Glycemic Control (2015)  Target Ranges:  Prepandial:   less than 140 mg/dL      Peak postprandial:   less than 180 mg/dL (1-2 hours)      Critically ill patients:  140 - 180 mg/dL   Results for TARICA, HARL (MRN [id number]) as of 06/01/2017 11:07  Ref. Range 05/31/2017 08:03 05/31/2017 12:41 05/31/2017 16:46 05/31/2017 22:00 06/01/2017 04:39 06/01/2017 08:44  Glucose-Capillary Latest Ref Range: 65 - 99 mg/dL 197 (H) 271 (H) 181 (H) 309 (H) 153 (H) 107 (H)   Review of Glycemic Control  Outpatient Diabetes medications: Lantus 30 units BID, Humalog 2-5 units BID Current orders for Inpatient glycemic control: Novolog 0-9 units Q4H  Inpatient Diabetes Program Recommendations: Insulin - Basal: Noted Lantus 15 units BID order was discontinued last night and no basal insulin ordered at this time. Please consider reordering basal insulin after heart cath today.  Thanks, Barnie Alderman, RN, MSN, CDE Diabetes Coordinator Inpatient Diabetes Program [phone] (Team Pager from 8am to 5pm)
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
## 2017-06-01 NOTE — Progress Notes (Signed)
Family Medicine Progress Note  Saw and examined patient. Informed patient of the results of her cardiac cath. Patient asked what the plan would be and I informed her we would discuss her case as a team in conjunction with cardiology and inform her of the plan tomorrow. Examined her right groin access site. Soft, non distended with no signs of developing hematoma. 4x4 and tegaderm dressing covering.  Guadalupe Dawn MD PGY-1 Family Medicine Residency

## 2017-06-01 NOTE — Interval H&P Note (Signed)
History and Physical Interval Note:  06/01/2017 10:58 AM  Alejandra Hernandez  has presented today for surgery, with the diagnosis of NSTEMI  The various methods of treatment have been discussed with the patient and family. After consideration of risks, benefits and other options for treatment, the patient has consented to  Procedure(s): Left Heart Cath and Coronary Angiography (N/A) as a surgical intervention .  The patient's history has been reviewed, patient examined, no change in status, stable for surgery.  I have reviewed the patient's chart and labs.  Questions were answered to the patient's satisfaction.    Cath Lab Visit (complete for each Cath Lab visit)  Clinical Evaluation Leading to the Procedure:   ACS: Yes.    Non-ACS:    Anginal Classification: CCS IV  Anti-ischemic medical therapy: Maximal Therapy (2 or more classes of medications)  Non-Invasive Test Results: No non-invasive testing performed  Prior CABG: No previous CABG       Alejandra Hernandez [facility] 06/01/2017 10:58 AM

## 2017-06-01 NOTE — Care Management Note (Addendum)
Case Management Note  Patient Details  Name: Alejandra Hernandez MRN: [id number] Date of Birth: 10-19-1956  Subjective/Objective:      Pt admitted with CP and Hematemesis - 2 weeks s/p RCA stent             Action/Plan:   PTA independent from home.   PTA on Xarelto and Plavix. CM will continue to follow for discharge needs   Expected Discharge Date:  06/01/17               Expected Discharge Plan:  Home/Self Care  In-House Referral:     Discharge planning Services  CM Consult  Post Acute Care Choice:    Choice offered to:     DME Arranged:    DME Agency:     HH Arranged:    HH Agency:     Status of Service:     If discussed at [employer], dates discussed:    Additional Comments:  Maryclare Labrador, RN 06/01/2017, 10:29 AM

## 2017-06-01 NOTE — Progress Notes (Signed)
Site area: rt groin fa sheath Site Prior to Removal:  Level 0 Pressure Applied For: 20 minutes Manual:   yes Patient Status During Pull:  stable Post Pull Site:  Level 0 Post Pull Instructions Given:  yes Post Pull Pulses Present: palpable Dressing Applied:  Gauze and tegaderm Bedrest begins @ 1155 Comments:

## 2017-06-02 ENCOUNTER — Ambulatory Visit: Payer: Medicare Other | Admitting: Cardiology

## 2017-06-02 ENCOUNTER — Encounter (HOSPITAL_COMMUNITY): Payer: Self-pay | Admitting: Cardiology

## 2017-06-02 DIAGNOSIS — E785 Hyperlipidemia, unspecified: Secondary | ICD-10-CM

## 2017-06-02 DIAGNOSIS — I10 Essential (primary) hypertension: Secondary | ICD-10-CM

## 2017-06-02 DIAGNOSIS — K922 Gastrointestinal hemorrhage, unspecified: Secondary | ICD-10-CM

## 2017-06-02 LAB — GLUCOSE, CAPILLARY
GLUCOSE-CAPILLARY: 135 mg/dL — AB (ref 65–99)
Glucose-Capillary: 84 mg/dL (ref 65–99)
Glucose-Capillary: 216 mg/dL — ABNORMAL HIGH (ref 65–99)
Glucose-Capillary: 239 mg/dL — ABNORMAL HIGH (ref 65–99)

## 2017-06-02 LAB — BASIC METABOLIC PANEL
Anion gap: 9 (ref 5–15)
BUN: 6 mg/dL (ref 6–20)
CO2: 18 mmol/L — ABNORMAL LOW (ref 22–32)
CREATININE: 1.19 mg/dL — AB (ref 0.44–1.00)
Calcium: 8.5 mg/dL — ABNORMAL LOW (ref 8.9–10.3)
Chloride: 112 mmol/L — ABNORMAL HIGH (ref 101–111)
GFR calc Af Amer: 56 mL/min — ABNORMAL LOW (ref >60)
GFR, EST NON AFRICAN AMERICAN: 49 mL/min — AB (ref >60)
Glucose, Bld: 90 mg/dL (ref 65–99)
Potassium: 3.5 mmol/L (ref 3.5–5.1)
SODIUM: 139 mmol/L (ref 135–145)

## 2017-06-02 LAB — CBC
HCT: 29.5 % — ABNORMAL LOW (ref 36.0–46.0)
Hemoglobin: 9 g/dL — ABNORMAL LOW (ref 12.0–15.0)
MCH: 21.3 pg — AB (ref 26.0–34.0)
MCHC: 30.5 g/dL (ref 30.0–36.0)
MCV: 69.9 fL — ABNORMAL LOW (ref 78.0–100.0)
PLATELETS: 331 10*3/uL (ref 150–400)
RBC: 4.22 MIL/uL (ref 3.87–5.11)
RDW: 14.9 % (ref 11.5–15.5)
WBC: 10.8 10*3/uL — ABNORMAL HIGH (ref 4.0–10.5)

## 2017-06-02 MED ORDER — INSULIN GLARGINE 100 UNIT/ML ~~LOC~~ SOLN
15.0000 [IU] | Freq: Two times a day (BID) | SUBCUTANEOUS | Status: DC
  Administered 2017-06-02: 15 [IU] via SUBCUTANEOUS
  Filled 2017-06-02 (×2): qty 0.15

## 2017-06-02 MED ORDER — ISOSORBIDE MONONITRATE ER 30 MG PO TB24
30.0000 mg | ORAL_TABLET | Freq: Every day | ORAL | Status: DC
  Administered 2017-06-02: 30 mg via ORAL
  Filled 2017-06-02: qty 1

## 2017-06-02 MED ORDER — SUCRALFATE 1 GM/10ML PO SUSP: 1.0000 g | Freq: Three times a day (TID) | ORAL | 0 refills | Status: DC

## 2017-06-02 MED ORDER — SODIUM CHLORIDE 0.9 % IV SOLN
510.0000 mg | Freq: Once | INTRAVENOUS | Status: AC
  Administered 2017-06-02: 510 mg via INTRAVENOUS
  Filled 2017-06-02: qty 17

## 2017-06-02 MED ORDER — ISOSORBIDE MONONITRATE ER 30 MG PO TB24: 60.0000 mg | ORAL_TABLET | Freq: Every day | ORAL | 0 refills | Status: DC

## 2017-06-02 MED ORDER — ISOSORBIDE MONONITRATE ER 30 MG PO TB24: 60.0000 mg | ORAL_TABLET | Freq: Every day | ORAL | Status: DC

## 2017-06-02 NOTE — Progress Notes (Signed)
Progress Note  Patient Name: Alejandra Hernandez Date of Encounter: 06/02/2017  Primary Cardiologist: Alejandra Hernandez  Patient Profile     61 y.o. female history of hypertension, diabetes, DVT, GI bleed, PVD, CAD s/p recent PCI to RCA (on triple therapy) who presented with chest pressure, hematemesis, and weakness.  She ruled in for NSTEMI, but was complicated by UGI Bld.  Subjective   Overall feeling better - had 1 episode of resting CP this AM, but not with ambulation Wants to go home. No DOE.  Inpatient Medications    Scheduled Meds: . amitriptyline  25 mg Oral Daily  . amitriptyline  50 mg Oral QHS  . aspirin  81 mg Oral Daily  . cloNIDine  0.2 mg Oral TID  . clopidogrel  75 mg Oral Q breakfast  . docusate sodium  100 mg Oral BID  . gabapentin  300 mg Oral TID  . insulin aspart  0-9 Units Subcutaneous Q4H  . insulin glargine  15 Units Subcutaneous BID  . isosorbide mononitrate  30 mg Oral Daily  . metoprolol tartrate  12.5 mg Oral BID  . pantoprazole (PROTONIX) IV  40 mg Intravenous Q12H  . rosuvastatin  40 mg Oral QHS  . sodium chloride flush  3 mL Intravenous Q12H  . sodium chloride flush  3 mL Intravenous Q12H  . sucralfate  1 g Oral TID WC & HS   Continuous Infusions: . sodium chloride     PRN Meds: sodium chloride, acetaminophen **OR** acetaminophen, ALPRAZolam, ondansetron **OR** ondansetron (ZOFRAN) IV, oxyCODONE-acetaminophen **AND** oxyCODONE, sodium chloride flush   Vital Signs    Vitals:   06/02/17 0400 06/02/17 0500 06/02/17 0838 06/02/17 0946  BP: 138/74 (!) 109/57 134/64 (!) 127/51  Pulse: 66 (!) 53 62   Resp: 15 17 (!) 24   Temp:   99.3 F (37.4 C)   TempSrc:   Oral   SpO2: 98% 90% 94%   Weight:      Height:        Intake/Output Summary (Last 24 hours) at 06/02/17 1004 Last data filed at 06/02/17 0833  Gross per 24 hour  Intake            187.8 ml  Output              800 ml  Net           -612.2 ml   Filed Weights   05/31/17 0439 06/01/17  0400 06/02/17 0337  Weight: 132 lb (59.9 kg) 137 lb 14.4 oz (62.6 kg) 138 lb (62.6 kg)    Telemetry    NSR 60s with PVCs - Personally Reviewed  ECG    None today - Personally Reviewed  Physical Exam   GEN: No acute distress.   Neck: No JVD Cardiac: RRR, no murmurs, rubs, or gallops.  Respiratory: Clear to auscultation bilaterally. Non-labored GI: Soft, nontender, non-distended, NABS MS: No edema; No deformity. R groin C/D/I. Neuro:  Nonfocal  Psych: Normal affect   Labs    Chemistry Recent Labs Lab 05/29/17 1130  05/31/17 0256 06/01/17 0256 06/02/17 0307  NA 131*  < > 134* 140 139  K 3.5  < > 3.2* 4.2 3.5  CL 99*  < > 107 116* 112*  CO2 22  < > 19* 18* 18*  GLUCOSE 252*  < > 122* 138* 90  BUN 8  < > 9 10 6   CREATININE 1.20*  < > 1.28* 1.26* 1.19*  CALCIUM 9.0  < >  8.2* 8.2* 8.5*  PROT 7.0  --   --   --   --   ALBUMIN 3.3*  --   --   --   --   AST 83*  --   --   --   --   ALT 25  --   --   --   --   ALKPHOS 101  --   --   --   --   BILITOT 0.4  --   --   --   --   GFRNONAA 48*  < > 45* 45* 49*  GFRAA 56*  < > 52* 53* 56*  ANIONGAP 10  < > 8 6 9   < > = values in this interval not displayed.   Hematology  Recent Labs Lab 05/31/17 0256 06/01/17 0256 06/02/17 0307  WBC 13.3* 13.9* 10.8*  RBC 4.23 3.99 4.22  HGB 9.0* 8.6* 9.0*  HCT 29.4* 28.4* 29.5*  MCV 69.5* 71.2* 69.9*  MCH 21.0* 21.6* 21.3*  MCHC 30.3 30.3 30.5  RDW 14.6 15.3 14.9  PLT 373 363 331    Cardiac Enzymes  Recent Labs Lab 05/29/17 1130 05/29/17 2304 05/30/17 0034 05/30/17 0605  TROPONINI 8.11* 21.30* 21.62* 18.56*   No results for input(s): TROPIPOC in the last 168 hours.   BNPNo results for input(s): BNP, PROBNP in the last 168 hours.   DDimer No results for input(s): DDIMER in the last 168 hours.   Radiology    No results found.  Cardiac Studies   Cardiac Cath 06/01/17:   Mid RCA lesion, 0 %stenosed at site of prior stent.  Prox Cx to Mid Cx lesion, 20  %stenosed.  Mid Cx lesion, 100 %stenosed.  Prox RCA lesion, 20 %stenosed.  LV end diastolic pressure is mildly elevated.   1. Single vessel occlusive CAD. The stent in the mid RCA is widely patent. The LCx is now occluded in the mid vessel. This supplies a relatively small terminal OM 2.  2. Mildly elevated LVEDP  Plan: Her NSTEMI is explained by occlusion of the LCx prior to the second OM. On prior cath this vessel was diffusely diseased up to 60-80%. I would recommend medical management.    2 D Echo 05/30/2017: mod LVH. EF 60-65%. Basal-mid Inferoseptal & basal-mid inferior HK.  Gr 1 DD   Assessment & Plan    Principal Problem:   Acute upper GI bleeding Active Problems:   Coronary artery disease involving native coronary artery of native heart with unstable angina pectoris (HCC)   Hyperlipidemia with target low density lipoprotein (LDL) cholesterol less than 70 mg/dL   Essential hypertension   Chronic diastolic CHF (congestive heart failure) (HCC)   Type 2 diabetes mellitus with complication, with long-term current use of insulin (HCC)   Non-ST elevation (NSTEMI) myocardial infarction ([place])   NSTEMI/CAD -  Her NSTEMI is most likely the result of her newly noted occlusion of the distal Cx - not thought to be a good PCI target (especially in light of recent GI Bld. RCA stent is patent - continue ASA/Plavix (if OK with GI); would be OK to stop ASA after ~1 month if needed b/c GI Bld concern. Continue BB, Statin along with IMDUR (would increase IMDUR to 60 mg given pain this AM)  Was on Xarelto for prior DVT - not sure how long the Rx was planned, but if possible, would hold for ~ 1 month until ok to stop ASA.  DM - per teaching Svc.  BP stable - on current meds On  Crestor for HLD.   Stable for d/c from Cardiology standpoint.   We will arrange OP f/u with Dr. Radford Hernandez or APP   Signed, Alejandra Hew, MD  06/02/2017, 10:04 AM

## 2017-06-02 NOTE — Progress Notes (Signed)
Family Medicine Progress Note  Saw and Evaluated patient. Had long discussion regarding recommendation for restarting xarelto. Explained risks of not restarting medication as acute PE, MI, or Stroke from a mobile clot. Explained risks of restarting medication as possible gi bleed. Patient expressed wishes to follow hemoglobin chronically in clinic and resume xarelto. Will plan to have her follow up hemoglobin level in clinic and restart xarelto at discharge. Patient is in agreement with this plan.  Guadalupe Dawn MD PGY-1 Family Medicine Resident

## 2017-06-02 NOTE — Progress Notes (Signed)
FPTS Social Progress Note  Called and spoke with the patient in her hospital room this AM. She states she is feeling a little bit better this morning. I spoke with her about having an office visit in [facility] clinic so that we can spend extra time going over all of her health problems. She is agreeable to an appointment on 7/30.  I will put this in her discharge paperwork.  Will continue to follow along with the excellent care by the family medicine team.  Everrett Coombe, MD 06/02/2017, 7:06 AM PGY-2, [place]

## 2017-06-02 NOTE — Discharge Summary (Signed)
[facility] Discharge Summary  Patient name: Alejandra Hernandez Medical record number: [id number] Date of birth: 12/02/1955 Age: 61 y.o. Gender: female Date of Admission: 05/29/2017  Date of Discharge: 06/02/2017 Admitting Physician: Dickie La, MD  Primary Care Provider: Everrett Coombe, MD Consultants: cardiology, GI  Indication for Hospitalization: NSTEMI, GI bleed  Discharge Diagnoses/Problem List:  NSTEMI GI bleed Low TSH Hypokalemia PVD W1UX HLD HTN Diastolic Heart Failure Tobacco Abuse Elevation of creatinine  Disposition: home  Discharge Condition: good, stable  Discharge Exam: General: NAD, resting comfortably Cardiovascular: RRR, SEM 2/6. No rubs or gallops.  Respiratory: CTAB, normal effort Abdomen: soft, no tenderness, ND, + bowel sounds  Extremities: no LE swelling, callous on medial surface left foot  Brief Hospital Course:  Alejandra Hernandez a 61 y.o.femalepresenting with worsening chest pain for the last 2 weeks after placement of RCA stent and hematemesis following this procedure and subsequent initiation of ASA/Plavix. She was started on a nitroglycerin drip which brought about resolution of her symptoms. On HD1 she underwent an egd which showed esophagitis and gastritis on 7/7. This resulted in no obvious source of bleeding, and only showed esophagitis. She underwent cardiac cath on 7/9 which showed complete occlusion of left circumflex. Per cardiology recommendations this area is not a good PCI target. Per recommendations she was deemed ok for discharge with recommendations to increase IMDUR. Prior to discharge the patient had a follow up appointment made with Dr. Burr Medico in late July. Had long discussion with patient regarding xarelto for anticoagulation. Patient expressed desire to restart xarelto soon to decrease the chance of having stroke, PE, or MI. Has scheduled follow up with dr. Burr Medico in clinic in 2-3 weeks.  Issues for Follow Up:   1. Chest pain control  Significant Procedures: EGD, Cardiac Cath  Significant Labs and Imaging:   Recent Labs Lab 05/31/17 0256 06/01/17 0256 06/02/17 0307  WBC 13.3* 13.9* 10.8*  HGB 9.0* 8.6* 9.0*  HCT 29.4* 28.4* 29.5*  PLT 373 363 331    Recent Labs Lab 05/29/17 1130 05/30/17 0605 05/30/17 1805 05/31/17 0256 06/01/17 0256 06/02/17 0307  NA 131* 134* 134* 134* 140 139  K 3.5 2.9* 3.5 3.2* 4.2 3.5  CL 99* 101 103 107 116* 112*  CO2 22 19* 20* 19* 18* 18*  GLUCOSE 252* 268* 220* 122* 138* 90  BUN 8 7 5* 9 10 6   CREATININE 1.20* 1.02* 1.08* 1.28* 1.26* 1.19*  CALCIUM 9.0 9.0 8.8* 8.2* 8.2* 8.5*  ALKPHOS 101  --   --   --   --   --   AST 83*  --   --   --   --   --   ALT 25  --   --   --   --   --   ALBUMIN 3.3*  --   --   --   --   --      Results/Tests Pending at Time of Discharge: none  Discharge Medications:  Allergies as of 06/02/2017   No Known Allergies     Medication List    TAKE these medications   ALPRAZolam 0.5 MG tablet Commonly known as:  XANAX Take 0.5 mg by mouth daily as needed for anxiety.   amitriptyline 25 MG tablet Commonly known as:  ELAVIL Take 25-50 mg by mouth See admin instructions. Take 25 mg by mouth in the morning and take 50 mg by mouth at bedtime   amLODipine 10 MG tablet Commonly  known as:  NORVASC Take 10 mg by mouth daily with supper.   ARTIFICIAL TEAR OP Place 2 drops into both eyes as needed (for dry eyes).   aspirin 81 MG chewable tablet Chew 1 tablet (81 mg total) by mouth daily.   cloNIDine 0.2 MG tablet Commonly known as:  CATAPRES TAKE 1 TABLET(0.2 MG) BY MOUTH THREE TIMES DAILY   clopidogrel 75 MG tablet Commonly known as:  PLAVIX Take 1 tablet (75 mg total) by mouth daily with breakfast.   ferrous sulfate 325 (65 FE) MG tablet Take 325 mg by mouth 2 (two) times daily with a meal.   gabapentin 600 MG tablet Commonly known as:  NEURONTIN Take 0.5 tablets (300 mg total) by mouth 3 (three) times  daily.   HUMALOG KWIKPEN 100 UNIT/ML KiwkPen Generic drug:  insulin lispro Inject 2-5 Units into the skin 2 (two) times daily as needed (for high blood sugar). Per sliding scale   isosorbide mononitrate 30 MG 24 hr tablet Commonly known as:  IMDUR Take 2 tablets (60 mg total) by mouth at bedtime. What changed:  how much to take   LANTUS 100 UNIT/ML injection Generic drug:  insulin glargine Inject 30 Units into the skin 2 (two) times daily.   metoprolol tartrate 25 MG tablet Commonly known as:  LOPRESSOR Take 0.5 tablets (12.5 mg total) by mouth 2 (two) times daily.   nitroGLYCERIN 0.4 MG SL tablet Commonly known as:  NITROSTAT Place 1 tablet (0.4 mg total) under the tongue every 5 (five) minutes as needed for chest pain.   oxyCODONE-acetaminophen 10-325 MG tablet Commonly known as:  PERCOCET Take 1 tablet by mouth 3 (three) times daily as needed for pain.   pantoprazole 40 MG tablet Commonly known as:  PROTONIX Take 40 mg by mouth at bedtime.   rivaroxaban 20 MG Tabs tablet Commonly known as:  XARELTO Take 1 tablet (20 mg total) by mouth daily with supper.   rosuvastatin 40 MG tablet Commonly known as:  CRESTOR Take 40 mg by mouth at bedtime.   sucralfate 1 GM/10ML suspension Commonly known as:  CARAFATE Take 10 mLs (1 g total) by mouth 4 (four) times daily -  with meals and at bedtime.       Discharge Instructions: Please refer to Patient Instructions section of EMR for full details.  Patient was counseled important signs and symptoms that should prompt return to medical care, changes in medications, dietary instructions, activity restrictions, and follow up appointments.   Follow-Up Appointments: Follow-up Information    Everrett Coombe, MD Follow up on 06/22/2017.   Why:  Please arrive at 1:45 for your 2:00 PM visit with Dr. Burr Medico. This will be a longer, more detailed visit than usual. Contact information: [place] [address] [phone]            Guadalupe Dawn MD 06/02/2017, 4:19 PM PGY-1, [place]
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
## 2017-06-02 NOTE — Discharge Instructions (Signed)
You are being discharged with a higher dose of IMDUR (60mg  instead of 30mg ). You are being discharged with sucralfate for stomach protection.  Please follow up with scheduled appointment with Dr. Burr Medico on July 30 at 1:45PM.    Acute Coronary Syndrome  Acute coronary syndrome (ACS) is a serious problem in which there is suddenly not enough blood and oxygen supplied to the heart. ACS may mean that one or more of the blood vessels in your heart (coronary arteries) may be blocked. ACS can result in chest pain or a heart attack (myocardial infarction or MI). What are the causes? This condition is caused by atherosclerosis, which is the buildup of fat and cholesterol (plaque) on the inside of the arteries. Over time, the plaque may narrow or block the artery, and this will lessen blood flow to the heart. Plaque can also become weak and break off within a coronary artery to form a clot and cause a sudden blockage. What increases the risk? The risk factors of this condition include:  High cholesterol levels.  High blood pressure (hypertension).  Smoking.  Diabetes.  Age.  Family history of chest pain, heart disease, or stroke.  Lack of exercise.  What are the signs or symptoms? The most common signs of this condition include:  Chest pain, which can be: ? A crushing or squeezing in the chest. ? A tightness, pressure, fullness, or heaviness in the chest. ? Present for more than a few minutes, or it can stop and recur.  Pain in the arms, neck, jaw, or back.  Unexplained heartburn or indigestion.  Shortness of breath.  Nausea.  Sudden cold sweats.  Feeling light-headed or dizzy.  Sometimes, this condition has no symptoms. How is this diagnosed? ACS may be diagnosed through the following tests:  Electrocardiogram (ECG).  Blood tests.  Coronary angiogram. This is a procedure to look at the coronary arteries to see if there is any blockage.  How is this  treated? Treatment for ACS may include:  Healthy behavioral changes to reduce or control risk factors.  Medicine.  Coronary stenting.A stent helps to keep an artery open.  Coronary angioplasty. This procedure widens a narrowed or blocked artery.  Coronary artery bypass surgery. This will allow your blood to pass the blockage (bypass) to reach your heart.  Follow these instructions at home: Eating and drinking  Follow a heart-healthy diet. A dietitian can you help to educate you about healthy food options and changes.  Use healthy cooking methods such as roasting, grilling, broiling, baking, poaching, steaming, or stir-frying. Talk to a dietitian to learn more about healthy cooking methods. Medicines  Take medicines only as directed by your health care provider.  Do not take the following medicines unless your health care provider approves: ? Nonsteroidal anti-inflammatory drugs (NSAIDs), such as ibuprofen, naproxen, or celecoxib. ? Vitamin supplements that contain vitamin A, vitamin E, or both. ? Hormone replacement therapy that contains estrogen with or without progestin.  Stop illegal drug use. Activity  Follow an exercise program that is approved by your health care provider.  Plan rest periods when you are fatigued. Lifestyle  Do not use any tobacco products, including cigarettes, chewing tobacco, or electronic cigarettes. If you need help quitting, ask your health care provider.  If you drink alcohol, and your health care provider approves, limit your alcohol intake to no more than 1 drink per day. One drink equals 12 ounces of beer, 5 ounces of wine, or 1 ounces of hard liquor.  Learn to manage stress.  Maintain a healthy weight. Lose weight as approved by your health care provider. General instructions  Manage other health conditions, such as hypertension and diabetes, as directed by your health care provider.  Keep all follow-up visits as directed by your  health care provider. This is important.  Your health care provider may ask you to monitor your blood pressure. A blood pressure reading consists of a higher number over a lower number, such as 110 over 72, written as 110/72. Ideally, your blood pressure should be: ? Below 140/90 if you have no other medical conditions. ? Below 130/80 if you have diabetes or kidney disease. Get help right away if:  You have pain in your chest, neck, arm, jaw, stomach, or back that lasts more than a few minutes, is recurring, or is not relieved by taking medicine under your tongue (sublingual nitroglycerin).  You have profuse sweating without cause.  You have unexplained: ? Heartburn or indigestion. ? Shortness of breath or difficulty breathing. ? Nausea or vomiting. ? Fatigue. ? Feelings of nervousness or anxiety. ? Weakness. ? Diarrhea.  You have sudden light-headedness or dizziness.  You faint. These symptoms may represent a serious problem that is an emergency. Do not wait to see if the symptoms will go away. Get medical help right away. Call your local emergency services (911 in the [place] Do not drive yourself to the clinic or hospital. This information is not intended to replace advice given to you by your health care provider. Make sure you discuss any questions you have with your health care provider.   Gastrointestinal Bleeding  Gastrointestinal bleeding is bleeding somewhere along the path food travels through the body (digestive tract). This path is anywhere between the mouth and the opening of the butt (anus). You may have blood in your poop (stools) or have black poop. If you throw up (vomit), there may be blood in it. This condition can be mild, serious, or even life-threatening. If you have a lot of bleeding, you may need to stay in the hospital. Follow these instructions at home:  Take over-the-counter and prescription medicines only as told by your doctor.  Eat foods that have a  lot of fiber in them. These foods include whole grains, fruits, and vegetables. You can also try eating 1-3 prunes each day.  Drink enough fluid to keep your pee (urine) clear or pale yellow.  Keep all follow-up visits as told by your doctor. This is important. Contact a doctor if:  Your symptoms do not get better. Get help right away if:  Your bleeding gets worse.  You feel dizzy or you pass out (faint).  You feel weak.  You have very bad cramps in your back or belly (abdomen).  You pass large clumps of blood (clots) in your poop.  Your symptoms are getting worse. This information is not intended to replace advice given to you by your health care provider. Make sure you discuss any questions you have with your health care provider. Document Released: 08/19/2008 Document Revised: 04/17/2016 Document Reviewed: 04/30/2015 Elsevier Interactive Patient Education  2018 [employer].

## 2017-06-02 NOTE — Progress Notes (Signed)
Patient discharged with clothing, belongings, and paperwork- IV removed and wheeled out to daughter and son

## 2017-06-11 ENCOUNTER — Telehealth: Payer: Self-pay

## 2017-06-11 DIAGNOSIS — I214 Non-ST elevation (NSTEMI) myocardial infarction: Secondary | ICD-10-CM

## 2017-06-11 NOTE — Telephone Encounter (Signed)
Per Dr. Radford Pax, called patient to arrange GXT to determine mets for Cardiac Rehab Medicaid coverage. Reviewed instructions with patient. Scheduled her 8/8. Patient agrees with treatment plan and was grateful for call.

## 2017-06-12 ENCOUNTER — Ambulatory Visit (HOSPITAL_COMMUNITY)
Admission: RE | Admit: 2017-06-12 | Discharge: 2017-06-12 | Disposition: A | Discharge status: Home / Self Care | Payer: Medicare Other | Source: Ambulatory Visit | Attending: Cardiovascular Disease | Admitting: Cardiovascular Disease

## 2017-06-12 DIAGNOSIS — I6523 Occlusion and stenosis of bilateral carotid arteries: Secondary | ICD-10-CM | POA: Diagnosis not present

## 2017-06-12 DIAGNOSIS — I779 Disorder of arteries and arterioles, unspecified: Secondary | ICD-10-CM | POA: Diagnosis present

## 2017-06-12 DIAGNOSIS — I739 Peripheral vascular disease, unspecified: Secondary | ICD-10-CM

## 2017-06-13 ENCOUNTER — Encounter: Payer: Self-pay | Admitting: Cardiology

## 2017-06-16 ENCOUNTER — Telehealth: Payer: Self-pay

## 2017-06-16 DIAGNOSIS — I779 Disorder of arteries and arterioles, unspecified: Secondary | ICD-10-CM

## 2017-06-16 DIAGNOSIS — I739 Peripheral vascular disease, unspecified: Principal | ICD-10-CM

## 2017-06-16 NOTE — Telephone Encounter (Signed)
-----   Message from Sueanne Margarita, MD sent at 06/13/2017  4:22 PM EDT ----- 40-59% right and 1-39% left carotid stenosis - repeat dopplers in 1 year

## 2017-06-16 NOTE — Telephone Encounter (Signed)
Informed patient of results and verbal understanding expressed.  Repeat carotids ordered to be scheduled in 1 year. Patient agrees with treatment plan. 

## 2017-06-22 ENCOUNTER — Ambulatory Visit (INDEPENDENT_AMBULATORY_CARE_PROVIDER_SITE_OTHER): Payer: Medicare Other | Admitting: Student in an Organized Health Care Education/Training Program

## 2017-06-22 ENCOUNTER — Encounter: Payer: Self-pay | Admitting: Student in an Organized Health Care Education/Training Program

## 2017-06-22 ENCOUNTER — Telehealth: Payer: Self-pay | Admitting: Family Medicine

## 2017-06-22 VITALS — BP 140/68 | HR 79 | Temp 98.5°F | Ht 63.0 in | Wt 131.0 lb

## 2017-06-22 DIAGNOSIS — Z794 Long term (current) use of insulin: Secondary | ICD-10-CM

## 2017-06-22 DIAGNOSIS — K922 Gastrointestinal hemorrhage, unspecified: Secondary | ICD-10-CM | POA: Diagnosis not present

## 2017-06-22 DIAGNOSIS — R946 Abnormal results of thyroid function studies: Secondary | ICD-10-CM | POA: Diagnosis not present

## 2017-06-22 DIAGNOSIS — E118 Type 2 diabetes mellitus with unspecified complications: Secondary | ICD-10-CM

## 2017-06-22 DIAGNOSIS — R7989 Other specified abnormal findings of blood chemistry: Secondary | ICD-10-CM

## 2017-06-22 DIAGNOSIS — D649 Anemia, unspecified: Secondary | ICD-10-CM | POA: Diagnosis not present

## 2017-06-22 MED ORDER — SUCRALFATE 1 GM/10ML PO SUSP: 1.0000 g | Freq: Three times a day (TID) | ORAL | 0 refills | Status: DC

## 2017-06-22 NOTE — Assessment & Plan Note (Signed)
-   mostly resolved, still sees some "pink" when she coughs - unable to collect CBC at this visit due to poor veins, patient was called and asked to follow up for lab visit in next day or two - continue sucralfate and PPI - will follow up Hgb

## 2017-06-22 NOTE — Telephone Encounter (Signed)
Called asked her to bring in all her medication bottles She agrees

## 2017-06-22 NOTE — Assessment & Plan Note (Signed)
Poorly controlled. Patient has poor insight into her diabetes, continues to believe 300 is idea glucose for her despite counseling - continue diabetes education - continue smoking cessation counseling - continue current diabetes regimen

## 2017-06-22 NOTE — Patient Instructions (Signed)
It was a pleasure seeing you today in our clinic. Today we discussed your medical problems. Here is the treatment plan we have discussed and agreed upon together:  - We got lab work at today's visit. I will call you with the results of this lab work. If you do not hear from me in 1 week please call our office. - Please bring your glucometer, blood pressure cuff and medications to your next visit.  Our clinic's number is [phone]. Please call with questions or concerns about what we discussed today.  Be well, Dr. Burr Medico

## 2017-06-22 NOTE — Progress Notes (Signed)
   CC: Hospital follow up  HPI: Alejandra Hernandez is a 61 y.o. female with PMH significant for CAD, PAD, HLD, history of PE, DVT, GI bleed, T2DM, HTN, HFpEF, mass of upper lobe of right lung, nodule on liver, anxiety and chronic pain, and tobacco usewho [facility] and follow up of chronic illness.  Hospital follow up (NSTEMI and GI bleed in 05/2017) - patient reports still some "pink" coughed up after recent hospitalization with bloody emesis - continues to endorse symptoms of chest pain and dyspnea which improve when she sits and rests - patient reports she continues to take ASA and Plavix until August 7, then to discontinue these and restart Xarelto  T2DM - patient takes 30u Lantus BID, humalog 2-5u BID - patient's glycemic goal for herself is 300 because she states this is the glucose where she feels best. She endorses sometimes seeing sugars as high as 700s  - She feels hypoglycemic symptoms at 150 and feels this is dangerously low for her  TOPc  - Reports her next of kin is her son Percell Miller, who is also Economist - Lives at home with her roommate who is an alcoholic and with her son who is blind, she is responsible for taking care of these people - Transportation by friends or [place] paid for through medicaid/medicare, used to work in [employer] [place] she has been handling stress by smoking cigarettes, meditating   Review of Symptoms:  See HPI for ROS.   CC, SH/smoking status, and VS noted.  Objective: BP 140/68   Pulse 79   Temp 98.5 F (36.9 C) (Oral)   Ht 5\' 3"  (1.6 m)   Wt 131 lb (59.4 kg)   LMP  (LMP Unknown)   SpO2 99%   BMI 23.21 kg/m  GEN: NAD, alert, cooperative. Foot: ~2cm dark lesion noted over patient's right heel consistent with pressure ulcer, no erythema, warmth or drainage, no indication of infection  Assessment and plan:  Acute upper GI bleeding - mostly resolved, still sees some "pink" when she  coughs - unable to collect CBC at this visit due to poor veins, patient was called and asked to follow up for lab visit in next day or two - continue sucralfate and PPI - will follow up Hgb   Type 2 diabetes mellitus with complication, with long-term current use of insulin ([place]) Poorly controlled. Patient has poor insight into her diabetes, continues to believe 300 is idea glucose for her despite counseling - continue diabetes education - continue smoking cessation counseling - continue current diabetes regimen   Low TSH level - noted during hospitalization - follow up TSH ordered but unable to draw due to poor blood vessels - patient to follow up for TSH in the lab   Orders Placed This Encounter  Procedures  . CBC  . TSH    Meds ordered this encounter  Medications  . sucralfate (CARAFATE) 1 GM/10ML suspension    Sig: Take 10 mLs (1 g total) by mouth 4 (four) times daily -  with meals and at bedtime.    Dispense:  420 mL    Refill:  0    Everrett Coombe, MD,MS,  PGY2 06/22/2017 4:55 PM

## 2017-06-22 NOTE — Assessment & Plan Note (Signed)
-   noted during hospitalization - follow up TSH ordered but unable to draw due to poor blood vessels - patient to follow up for TSH in the lab

## 2017-06-29 ENCOUNTER — Other Ambulatory Visit: Payer: Self-pay | Admitting: Internal Medicine

## 2017-06-29 DIAGNOSIS — Z1231 Encounter for screening mammogram for malignant neoplasm of breast: Secondary | ICD-10-CM

## 2017-07-01 ENCOUNTER — Ambulatory Visit: Payer: Medicare Other

## 2017-07-07 ENCOUNTER — Telehealth: Payer: Self-pay

## 2017-07-07 NOTE — Telephone Encounter (Signed)
-----   Message from Rowe Pavy, RN sent at 07/01/2017  8:37 PM EDT ----- Regarding: Follow up in cardiologist office Hi Kathyrn,  We got the completed medicaid order for the above patient.  Looks like Dr. Radford Pax missed the last check mark regarding the GXT or either it didn't come through on the faxed copy. There is some writing toward the bottom.  I could not make out the METS.  I assume they were less than 5.  2nd - I do not see that the pt was seen in the office by [facility] or by an extender.  I don't know if this was missed upon discharge.  Pt has been seen by primary Md at family practice.  I will hold on contacting pt to schedule until she is seen in the office - we generally like for MI pts to be seen first before scheduling them for cardiac rehab.   Thanks for your help Cherre Huger, BSN Cardiac and Pulmonary Rehab Nurse Navigator ;

## 2017-07-07 NOTE — Telephone Encounter (Signed)
Scheduled patient 9/6 for evaluation prior to starting Cardiac Rehab.  Message sent to Cardiac Rehab to resend paperwork to be signed by Dr. Radford Pax.

## 2017-07-16 ENCOUNTER — Other Ambulatory Visit: Payer: Self-pay | Admitting: Family Medicine

## 2017-07-22 ENCOUNTER — Other Ambulatory Visit: Payer: Self-pay | Admitting: *Deleted

## 2017-07-22 DIAGNOSIS — I10 Essential (primary) hypertension: Secondary | ICD-10-CM

## 2017-07-23 MED ORDER — CLONIDINE HCL 0.2 MG PO TABS: ORAL_TABLET | ORAL | 3 refills | Status: DC

## 2017-07-24 ENCOUNTER — Ambulatory Visit
Admission: RE | Admit: 2017-07-24 | Discharge: 2017-07-24 | Disposition: A | Discharge status: Home / Self Care | Payer: Medicare Other | Source: Ambulatory Visit | Attending: Internal Medicine | Admitting: Internal Medicine

## 2017-07-24 DIAGNOSIS — Z1231 Encounter for screening mammogram for malignant neoplasm of breast: Secondary | ICD-10-CM

## 2017-07-29 ENCOUNTER — Ambulatory Visit (INDEPENDENT_AMBULATORY_CARE_PROVIDER_SITE_OTHER): Payer: Medicare Other | Admitting: Cardiology

## 2017-07-29 ENCOUNTER — Encounter: Payer: Self-pay | Admitting: Cardiology

## 2017-07-29 VITALS — BP 128/60 | HR 71 | Ht 65.0 in | Wt 143.0 lb

## 2017-07-29 DIAGNOSIS — I2510 Atherosclerotic heart disease of native coronary artery without angina pectoris: Secondary | ICD-10-CM

## 2017-07-29 MED ORDER — CLOPIDOGREL BISULFATE 75 MG PO TABS: 75.0000 mg | ORAL_TABLET | Freq: Every day | ORAL | 3 refills | Status: DC

## 2017-07-29 NOTE — Patient Instructions (Signed)
Medication Instructions: Your physician has recommended you make the following change in your medication:  -1) STOP Aspirin -2) RESTART Plavix (Clopidigrel) - Take 1 tablet (75 mg) by mouth daily   Labwork: None Ordered  Procedures/Testing: None Ordered  Follow-Up: - You have been referred to Cardiac Rehab. Someone from their office will contact you in regards to setting up the appointments.   - Your physician recommends that you schedule a follow-up appointment in: December with Dr. Radford Pax   If you need a refill on your cardiac medications before your next appointment, please call your pharmacy.
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
## 2017-08-10 ENCOUNTER — Telehealth (HOSPITAL_COMMUNITY): Payer: Self-pay | Admitting: Pharmacist

## 2017-08-10 NOTE — Telephone Encounter (Signed)
Cardiac Rehab Medication Review by a Pharmacist  Does the patient  feel that his/her medications are working for him/her?  Yes, carafate and pantoprazole for GI bleeding and patient stated she has been recently coughing up blood and also waking up with blood in her mouth. She told her cardiologist this at a recent appointment and was directed to stop taking ASA. She has stopped this medication, but the symptoms persist. I directed patient to call the cardiology office about these symptoms this morning and obtain a plan for further action. Unsure of patient history with GI bleeding. Patient has also recently ran out of her carafate and also had her dose of pantoprazole reduced from BID to QD.   Has the patient been experiencing any side effects to the medications prescribed?  no  Does the patient measure his/her own blood pressure or blood glucose at home?  Yes, both   Does the patient have any problems obtaining medications due to transportation or finances?   no  Understanding of regimen: good Understanding of indications: good Potential of compliance: good, out of carafate currently    Pharmacist comments: Ms. Saintvil is a 61 y.o. female who seemed in good spirits and health over the phone this morning. I discussed her medications with her and she had no concerns other than that documented above. The patient will call her cardiology office for further direction and also planned to go to urgent care for the bleeding if her cardiology office was unable to assist. Patient has been out of her carafate for an unknown number of days, leading to concern of compliance with other medications as well.    Jalene Mullet, Pharm.D. PGY1 Pharmacy Resident 08/10/2017 10:54 AM Main Pharmacy: [phone]

## 2017-08-11 ENCOUNTER — Telehealth: Payer: Self-pay | Admitting: Cardiology

## 2017-08-11 NOTE — Telephone Encounter (Signed)
Patient reports bloody sputum has not changed since she saw B. [place], [place] 9/5. She does not report any new symptoms or any new signs of bleeding. Per B. Simmons, reiterated to patient that she cannot stop her medications due to recent stenting. Instructed her to call GI and to move up appointment currently scheduled next month to ASAP. She agrees with treatment plan.

## 2017-08-11 NOTE — Telephone Encounter (Signed)
New message      Calling to let the nurse know that pt is still coughing up blood and she still has blood in her saliva.  She was seen on 07-29-17. Please advise

## 2017-08-12 ENCOUNTER — Telehealth: Payer: Self-pay | Admitting: Gastroenterology

## 2017-08-12 ENCOUNTER — Other Ambulatory Visit (INDEPENDENT_AMBULATORY_CARE_PROVIDER_SITE_OTHER): Payer: Medicare Other

## 2017-08-12 DIAGNOSIS — R042 Hemoptysis: Secondary | ICD-10-CM

## 2017-08-12 LAB — CBC WITH DIFFERENTIAL/PLATELET
BASOS ABS: 0.1 10*3/uL (ref 0.0–0.1)
Basophils Relative: 1 % (ref 0.0–3.0)
Eosinophils Absolute: 0.3 10*3/uL (ref 0.0–0.7)
Eosinophils Relative: 3.4 % (ref 0.0–5.0)
HEMATOCRIT: 32.9 % — AB (ref 36.0–46.0)
Hemoglobin: 10 g/dL — ABNORMAL LOW (ref 12.0–15.0)
Lymphocytes Relative: 42.1 % (ref 12.0–46.0)
Lymphs Abs: 4.2 10*3/uL — ABNORMAL HIGH (ref 0.7–4.0)
MCHC: 30.3 g/dL (ref 30.0–36.0)
MCV: 72.6 fl — AB (ref 78.0–100.0)
MONOS PCT: 6.8 % (ref 3.0–12.0)
Monocytes Absolute: 0.7 10*3/uL (ref 0.1–1.0)
NEUTROS PCT: 46.7 % (ref 43.0–77.0)
Neutro Abs: 4.6 10*3/uL (ref 1.4–7.7)
Platelets: 232 10*3/uL (ref 150.0–400.0)
RBC: 4.52 Mil/uL (ref 3.87–5.11)
RDW: 17.2 % — ABNORMAL HIGH (ref 11.5–15.5)
WBC: 9.9 10*3/uL (ref 4.0–10.5)

## 2017-08-12 NOTE — Telephone Encounter (Signed)
Oncall Note The patient called with complaints of spitting up blood and coughing blood. She has been doing this for past 2-3 days and was advised by PMD to hold aspirin. She is on aspirin, Plavix and also anticoagulation with Xarelto. Advised patient to go to emergency room if she starts coughing up large clots of blood or is vomiting blod. Denies any dizziness, melena or blood per rectum. I will forward message to Dr. Ardis Hughs

## 2017-08-12 NOTE — Telephone Encounter (Signed)
The confirmed she is taking pantoprazole twice daily and will come in for labs as requested

## 2017-08-12 NOTE — Telephone Encounter (Signed)
Veena, thanks  [place], She had severe esophagitis 2 months ago by EGD. Can you please contact her, confirm she is on twice daily PPI.  Also she needs cbc today or tomorrow.    thanks

## 2017-08-13 ENCOUNTER — Encounter (HOSPITAL_COMMUNITY)
Admission: RE | Admit: 2017-08-13 | Discharge: 2017-08-13 | Disposition: A | Discharge status: Home / Self Care | Payer: Medicare Other | Source: Ambulatory Visit | Attending: Cardiology | Admitting: Cardiology

## 2017-08-13 ENCOUNTER — Telehealth: Payer: Self-pay | Admitting: Gastroenterology

## 2017-08-13 ENCOUNTER — Encounter (HOSPITAL_COMMUNITY): Payer: Self-pay

## 2017-08-13 VITALS — BP 108/66 | HR 65 | Ht 62.0 in | Wt 143.1 lb

## 2017-08-13 DIAGNOSIS — Z955 Presence of coronary angioplasty implant and graft: Secondary | ICD-10-CM | POA: Insufficient documentation

## 2017-08-13 NOTE — Telephone Encounter (Signed)
Dr Ardis Hughs please see the note from cardiac rehab  Rowe Pavy, RN sent to Jeoffrey Massed, RN        Good Morning Alejandra Hernandez  This patient is in today for orientation for cardiac rehab and mentioned she had lab work done for oral bleeding. Looks like her Hbg is better but low (which may be her baseline). Checking in with you to see the plan so I will know when pt may return back to exercise (tentatively scheduled for Friday) Pt stated that she had one additional episode of bleeding in her mouth that wasn't as bad as before and none this morning.  Please let me know - Thanks!  Cherre Huger, BSN  Cardiac and Training and development officer

## 2017-08-13 NOTE — Progress Notes (Addendum)
Alejandra Hernandez 61 y.o. female DOB: 1956-03-02 MRN: [id number]      Nutrition Note  1. Stented coronary artery    Past Medical History:  Diagnosis Date  . AKI (acute kidney injury) ([place]) 01/2016  . Anemia   . Anxiety   . Arthritis    "right knee, back, feet" (07/30/2016)  . Bleeding stomach ulcer 01/2016  . CAD (coronary artery disease)    05/19/17 PCI with DESx1 to [facility]  . Carotid artery disease ([place])    40-59% right and 1-39% left by doppler 05/2017  . Chronic back pain   . Chronic lower back pain   . Coronary artery disease   . Diabetic peripheral neuropathy ([place])   . DVT (deep venous thrombosis) ([place])    bilateral diagnosed 01/2017  . GERD (gastroesophageal reflux disease)   . Gout   . Headache    "weekly" (07/30/2016)  . History of blood transfusion    "related to stomach bleeding"  . History of echocardiogram    a. Echo 3/17 mild concentric LVH, vigorous LVF, EF 65-70%, normal wall motion, grade 1 diastolic dysfunction, mild TR, PASP 31 mmHg  . History of nuclear stress test    a. Myoview 6/15: Low risk, no scar or ischemia, EF 72%  //  b. Myoview 7/17: EF 82%, no scar or ischemia, low risk  . Hyperlipidemia   . Hypertension   . PAD (peripheral artery disease) ([place])    a. ABIs 7/17: R 0.73, L 0.64, waveforms suggest bilateral SFA disease b. 07/30/16 balloon angioplasty to left SFA with atherectomy   . Type II diabetes mellitus ([place])    Meds reviewed. Lantus, Humalog noted  HT: Ht Readings from Last 1 Encounters:  08/13/17 5\' 2"  (1.575 m)    WT: Wt Readings from Last 3 Encounters:  08/13/17 143 lb 1.3 oz (64.9 kg)  07/29/17 143 lb (64.9 kg)  06/22/17 131 lb (59.4 kg)     BMI 26.2   Current tobacco use? Yes Labs:  Lipid Panel     Component Value Date/Time   CHOL 167 05/29/2017 2304   CHOL 147 05/14/2017 0954   TRIG 162 (H) 05/29/2017 2304   HDL 46 05/29/2017 2304   HDL 38 (L) 05/14/2017 0954   CHOLHDL 3.6 05/29/2017 2304   VLDL 32 05/29/2017 2304   LDLCALC  89 05/29/2017 2304   LDLCALC 69 05/14/2017 0954    Lab Results  Component Value Date   HGBA1C 12.8 (H) 05/29/2017       HGBA1C  16.0      12/24/2016 CBG (last 3)  No results for input(s): GLUCAP in the last 72 hours.  Nutrition Note Spoke with pt. Nutrition plan and goals reviewed with pt. Pt is not following the Therapeutic Lifestyle Changes diet. Pt reports she lost 20 lb during hospitalization and  wants to gain wt. Pt has been trying to gain wt by "eating ice cream 2 times/day."  Pt is diabetic. Last A1c indicates blood glucose poorly controlled. Pt reports her MD recently increased her Lantus to 40 units BID. Pt denies difficulty affording insulin. Pt admits to rotating injection sites. Pt checks CBG's 3-4 times a day. Pt currently has difficulty chewing due to waiting on her new dentures. Pt c/o decreased appetite due to multiple medical problems going on (e.g. Coughing up blood). Pt expressed understanding of the information reviewed. Pt aware of nutrition education classes offered.  Nutrition Diagnosis ? Food-and nutrition-related knowledge deficit related to lack of exposure to  information as related to diagnosis of: ? CVD ? DM  Nutrition Intervention ? Pt's individual nutrition plan and goals reviewed with pt.  Nutrition Goal(s):  ? Pt to identify and limit food sources of saturated fat, trans fat, and sodium once goal wt achieved ? Pt to identify food quantities necessary to achieve weight gain of 2-7 lb  at graduation from cardiac rehab. Goal wt of 145-150 lb desired.  ? Improve blood glucose control as evidenced by pt's A1c trending toward 7 or less.  Plan:  Pt to attend nutrition classes ? Nutrition I ? Nutrition II ? Portion Distortion ? Diabetes Blitz ? Diabetes Q & A Will provide client-centered nutrition education as part of interdisciplinary care.   Monitor and evaluate progress toward nutrition goal with team.  Derek Mound, M.Ed, RD, LDN, CDE 08/13/2017 9:41 AM

## 2017-08-13 NOTE — Progress Notes (Signed)
Cardiac Individual Treatment Plan  Patient Details  Name: Alejandra Hernandez MRN: [id number] Date of Birth: 1956/03/25 Referring Provider:     CARDIAC REHAB PHASE II ORIENTATION from 08/13/2017 in [facility]  Referring Provider  Fransico Him MD      Initial Encounter Date:    CARDIAC REHAB PHASE II ORIENTATION from 08/13/2017 in [facility]  Date  08/13/17  Referring Provider  Fransico Him MD      Visit Diagnosis: Stented coronary artery  Patient's Home Medications on Admission:  Current Outpatient Prescriptions:  .  ALPRAZolam (XANAX) 0.5 MG tablet, Take 0.25 mg by mouth 2 (two) times daily as needed for anxiety. , Disp: , Rfl: 2 .  amitriptyline (ELAVIL) 25 MG tablet, Take 25-50 mg by mouth See admin instructions. take 50 mg by mouth at bedtime, Disp: , Rfl: 5 .  amLODipine (NORVASC) 10 MG tablet, Take 10 mg by mouth daily with supper. , Disp: , Rfl:  .  ARTIFICIAL TEAR OP, Place 2 drops into both eyes as needed (for dry eyes)., Disp: , Rfl:  .  cloNIDine (CATAPRES) 0.2 MG tablet, TAKE 1 TABLET(0.2 MG) BY MOUTH THREE TIMES DAILY, Disp: 270 tablet, Rfl: 3 .  clopidogrel (PLAVIX) 75 MG tablet, Take 1 tablet (75 mg total) by mouth daily., Disp: 30 tablet, Rfl: 3 .  ferrous sulfate 325 (65 FE) MG tablet, Take 325 mg by mouth 2 (two) times daily with a meal., Disp: , Rfl:  .  gabapentin (NEURONTIN) 600 MG tablet, Take 0.5 tablets (300 mg total) by mouth 3 (three) times daily., Disp: 90 tablet, Rfl: 2 .  HUMALOG KWIKPEN 100 UNIT/ML KiwkPen, Inject 2-5 Units into the skin 2 (two) times daily as needed (for high blood sugar). Per sliding scale, Disp: , Rfl: 11 .  insulin glargine (LANTUS) 100 UNIT/ML injection, Inject 30 Units into the skin 2 (two) times daily. , Disp: , Rfl:  .  isosorbide mononitrate (IMDUR) 30 MG 24 hr tablet, Take 2 tablets (60 mg total) by mouth at bedtime., Disp: 30 tablet, Rfl: 0 .  metoprolol tartrate  (LOPRESSOR) 25 MG tablet, Take 0.5 tablets (12.5 mg total) by mouth 2 (two) times daily., Disp: 60 tablet, Rfl: 2 .  nitroGLYCERIN (NITROSTAT) 0.4 MG SL tablet, Place 1 tablet (0.4 mg total) under the tongue every 5 (five) minutes as needed for chest pain., Disp: 25 tablet, Rfl: 3 .  oxyCODONE-acetaminophen (PERCOCET) 10-325 MG tablet, Take 1 tablet by mouth 3 (three) times daily as needed for pain., Disp: , Rfl: 0 .  pantoprazole (PROTONIX) 40 MG tablet, TAKE 1 TABLET BY MOUTH TWICE DAILY BEFORE A MEAL (Patient taking differently: TAKE 1 TABLET BY MOUTH once daily), Disp: 180 tablet, Rfl: 0 .  rivaroxaban (XARELTO) 20 MG TABS tablet, Take 1 tablet (20 mg total) by mouth daily with supper., Disp: 30 tablet, Rfl: 3 .  rosuvastatin (CRESTOR) 40 MG tablet, Take 40 mg by mouth at bedtime., Disp: , Rfl:  .  sucralfate (CARAFATE) 1 GM/10ML suspension, Take 10 mLs (1 g total) by mouth 4 (four) times daily -  with meals and at bedtime., Disp: 420 mL, Rfl: 0  Past Medical History: Past Medical History:  Diagnosis Date  . AKI (acute kidney injury) ([place]) 01/2016  . Anemia   . Anxiety   . Arthritis    "right knee, back, feet" (07/30/2016)  . Bleeding stomach ulcer 01/2016  . CAD (coronary artery disease)  05/19/17 PCI with DESx1 to [place]  . Carotid artery disease ([place])    40-59% right and 1-39% left by doppler 05/2017  . Chronic back pain   . Chronic lower back pain   . Coronary artery disease   . Diabetic peripheral neuropathy ([place])   . DVT (deep venous thrombosis) ([place])    bilateral diagnosed 01/2017  . GERD (gastroesophageal reflux disease)   . Gout   . Headache    "weekly" (07/30/2016)  . History of blood transfusion    "related to stomach bleeding"  . History of echocardiogram    a. Echo 3/17 mild concentric LVH, vigorous LVF, EF 65-70%, normal wall motion, grade 1 diastolic dysfunction, mild TR, PASP 31 mmHg  . History of nuclear stress test    a. Myoview 6/15: Low risk, no scar or ischemia,  EF 72%  //  b. Myoview 7/17: EF 82%, no scar or ischemia, low risk  . Hyperlipidemia   . Hypertension   . PAD (peripheral artery disease) ([place])    a. ABIs 7/17: R 0.73, L 0.64, waveforms suggest bilateral SFA disease b. 07/30/16 balloon angioplasty to left SFA with atherectomy   . Type II diabetes mellitus ([facility])     Tobacco Use: History  Smoking Status  . Current Some Day Smoker  . Packs/day: 0.50  . Years: 40.00  . Types: Cigarettes, E-cigarettes  Smokeless Tobacco  . Former Systems developer  . Quit date: 11/24/1972    Labs: Recent Review Flowsheet Data    Labs for ITP Cardiac and Pulmonary Rehab Latest Ref Rng & Units 05/14/2016 12/28/2016 04/17/2017 05/14/2017 05/29/2017   Cholestrol 0 - 200 mg/dL - - - 147 167   LDLCALC 0 - 99 mg/dL - - - 69 89   HDL >40 mg/dL - - - 38(L) 46   Trlycerides <150 mg/dL - - - 199(H) 162(H)   Hemoglobin A1c 4.8 - 5.6 % 9.5 >15.5(H) 11.7 - 12.8(H)      Capillary Blood Glucose: Lab Results  Component Value Date   GLUCAP 239 (H) 06/02/2017   GLUCAP 216 (H) 06/02/2017   GLUCAP 84 06/02/2017   GLUCAP 135 (H) 06/01/2017   GLUCAP 254 (H) 06/01/2017     Exercise Target Goals: Date: 08/13/17  Exercise Program Goal: Individual exercise prescription set with THRR, safety & activity barriers. Participant demonstrates ability to understand and report RPE using BORG scale, to self-measure pulse accurately, and to acknowledge the importance of the exercise prescription.  Exercise Prescription Goal: Starting with aerobic activity 30 plus minutes a day, 3 days per week for initial exercise prescription. Provide home exercise prescription and guidelines that participant acknowledges understanding prior to discharge.  Activity Barriers & Risk Stratification:     Activity Barriers & Cardiac Risk Stratification - 08/13/17 0905      Activity Barriers & Cardiac Risk Stratification   Activity Barriers Deconditioning;Muscular Weakness;Shortness of Breath;Assistive  Device;History of Falls;Balance Concerns;Back Problems;Other (comment)   Comments PAD, R>L(stents on L), limited mobility in L arm, 2 surgeries on R knee, nerves taken out of big toe on L   Cardiac Risk Stratification High      6 Minute Walk:     6 Minute Walk    Row Name 08/13/17 1013         6 Minute Walk   Phase Initial     Distance 377 feet     Walk Time 4.21 minutes     # of Rest Breaks 2  1: for 19 seconds 2:  for 1 min and 20 seconds     MPH 1.01     METS 1.5     RPE 13     Perceived Dyspnea  1     VO2 Peak 5.25     Symptoms Yes (comment)     Comments SOB/fatigue: pt took 2 rest breaks- first break for 19 seconds and second break for 1 minute and 20 seconds     Resting HR 65 bpm     Resting BP 108/66     Resting Oxygen Saturation  95 %     Exercise Oxygen Saturation  during 6 min walk 95 %     Max Ex. HR 75 bpm     Max Ex. BP 120/66     2 Minute Post BP 114/62        Oxygen Initial Assessment:   Oxygen Re-Evaluation:   Oxygen Discharge (Final Oxygen Re-Evaluation):   Initial Exercise Prescription:     Initial Exercise Prescription - 08/13/17 1000      Date of Initial Exercise RX and Referring Provider   Date 08/13/17   Referring Provider Fransico Him MD     Recumbant Bike   Level 1   Watts 5   Minutes 15   METs 1.5     NuStep   Level 1   SPM 60   Minutes 15   METs 1     Track   Laps 5   Minutes 15   METs 1.5     Prescription Details   Frequency (times per week) 3   Duration Progress to 30 minutes of continuous aerobic without signs/symptoms of physical distress     Intensity   THRR 40-80% of Max Heartrate 64-128   Ratings of Perceived Exertion 11-15   Perceived Dyspnea 0-4     Progression   Progression Continue to progress workloads to maintain intensity without signs/symptoms of physical distress.     Resistance Training   Training Prescription Yes   Weight 1   Reps 10-15      Perform Capillary Blood Glucose checks as  needed.  Exercise Prescription Changes:   Exercise Comments:   Exercise Goals and Review:     Exercise Goals    Row Name 08/13/17 0906             Exercise Goals   Increase Physical Activity Yes       Intervention Provide advice, education, support and counseling about physical activity/exercise needs.;Develop an individualized exercise prescription for aerobic and resistive training based on initial evaluation findings, risk stratification, comorbidities and participant's personal goals.       Expected Outcomes Achievement of increased cardiorespiratory fitness and enhanced flexibility, muscular endurance and strength shown through measurements of functional capacity and personal statement of participant.       Increase Strength and Stamina Yes       Intervention Provide advice, education, support and counseling about physical activity/exercise needs.;Develop an individualized exercise prescription for aerobic and resistive training based on initial evaluation findings, risk stratification, comorbidities and participant's personal goals.       Expected Outcomes Achievement of increased cardiorespiratory fitness and enhanced flexibility, muscular endurance and strength shown through measurements of functional capacity and personal statement of participant.       Able to understand and use rate of perceived exertion (RPE) scale Yes       Intervention Provide education and explanation on how to use RPE scale       Expected Outcomes  Short Term: Able to use RPE daily in rehab to express subjective intensity level;Long Term:  Able to use RPE to guide intensity level when exercising independently       Able to understand and use Dyspnea scale Yes       Intervention Provide education and explanation on how to use Dyspnea scale       Expected Outcomes Short Term: Able to use Dyspnea scale daily in rehab to express subjective sense of shortness of breath during exertion;Long Term: Able to use  Dyspnea scale to guide intensity level when exercising independently       Knowledge and understanding of Target Heart Rate Range (THRR) Yes       Intervention Provide education and explanation of THRR including how the numbers were predicted and where they are located for reference       Expected Outcomes Short Term: Able to state/look up THRR;Long Term: Able to use THRR to govern intensity when exercising independently;Short Term: Able to use daily as guideline for intensity in rehab       Able to check pulse independently Yes       Intervention Provide education and demonstration on how to check pulse in carotid and radial arteries.;Review the importance of being able to check your own pulse for safety during independent exercise       Expected Outcomes Short Term: Able to explain why pulse checking is important during independent exercise;Long Term: Able to check pulse independently and accurately       Understanding of Exercise Prescription Yes       Intervention Provide education, explanation, and written materials on patient's individual exercise prescription       Expected Outcomes Short Term: Able to explain program exercise prescription          Exercise Goals Re-Evaluation :    Discharge Exercise Prescription (Final Exercise Prescription Changes):   Nutrition:  Target Goals: Understanding of nutrition guidelines, daily intake of sodium 1500mg , cholesterol 200mg , calories 30% from fat and 7% or less from saturated fats, daily to have 5 or more servings of fruits and vegetables.  Biometrics:     Pre Biometrics - 08/13/17 1036      Pre Biometrics   % Body Fat 39.4 %       Nutrition Therapy Plan and Nutrition Goals:     Nutrition Therapy & Goals - 08/13/17 1101      Nutrition Therapy   Diet Carb Modified, Therapeutic Lifestyle Changes     Personal Nutrition Goals   Nutrition Goal Pt to identify food quantities necessary to achieve wt gain of 2-5 lb to a goal wt of  145-150 lb at graduation from Cardiac Rehab.    Personal Goal #2 Improved glycemic control as evidenced by patient's A1c trending toward 7.0 or less. Per pt, A1c was 14 after recent re-check; previous A1c was 12.8   Personal Goal #3 Pt to identify and limit food sources of saturated fat, trans fat, and sodium once goal wt achieved     Intervention Plan   Intervention Prescribe, educate and counsel regarding individualized specific dietary modifications aiming towards targeted core components such as weight, hypertension, lipid management, diabetes, heart failure and other comorbidities.   Expected Outcomes Short Term Goal: Understand basic principles of dietary content, such as calories, fat, sodium, cholesterol and nutrients.;Long Term Goal: Adherence to prescribed nutrition plan.      Nutrition Discharge: Nutrition Scores:     Nutrition Assessments - 08/13/17 1052  MEDFICTS Scores   Pre Score 101      Nutrition Goals Re-Evaluation:   Nutrition Goals Re-Evaluation:   Nutrition Goals Discharge (Final Nutrition Goals Re-Evaluation):   Psychosocial: Target Goals: Acknowledge presence or absence of significant depression and/or stress, maximize coping skills, provide positive support system. Participant is able to verbalize types and ability to use techniques and skills needed for reducing stress and depression.  Initial Review & Psychosocial Screening:     Initial Psych Review & Screening - 08/13/17 1340      Initial Review   Current issues with Current Stress Concerns   Source of Stress Concerns Family;Financial;Chronic Illness   Comments pt remarks having several stressors. Pt adult son lives with her and pt also has a roomate who is retired but drinks heavily.     Family Dynamics   Concerns Inappropriate over/under dependence on family/friends      Quality of Life Scores:     Quality of Life - 08/13/17 1024      Quality of Life Scores   Health/Function Pre  23.79 %   Socioeconomic Pre 25.2 %   Psych/Spiritual Pre 27.21 %   Family Pre 28.5 %   GLOBAL Pre 25.57 %      PHQ-9: Recent Review Flowsheet Data    Depression screen [facility] 2/9 06/22/2017 04/17/2017 03/02/2017 01/23/2017 05/14/2016   Decreased Interest 0 0 0 0 0   Down, Depressed, Hopeless 0 0 0 0 0   PHQ - 2 Score 0 0 0 0 0     Interpretation of Total Score  Total Score Depression Severity:  1-4 = Minimal depression, 5-9 = Mild depression, 10-14 = Moderate depression, 15-19 = Moderately severe depression, 20-27 = Severe depression   Psychosocial Evaluation and Intervention:   Psychosocial Re-Evaluation:   Psychosocial Discharge (Final Psychosocial Re-Evaluation):   Vocational Rehabilitation: Provide vocational rehab assistance to qualifying candidates.   Vocational Rehab Evaluation & Intervention:   Education: Education Goals: Education classes will be provided on a weekly basis, covering required topics. Participant will state understanding/return demonstration of topics presented.  Learning Barriers/Preferences:     Learning Barriers/Preferences - 08/13/17 0905      Learning Barriers/Preferences   Learning Barriers Sight   Learning Preferences Written Material      Education Topics: Count Your Pulse:  -Group instruction provided by verbal instruction, demonstration, patient participation and written materials to support subject.  Instructors address importance of being able to find your pulse and how to count your pulse when at home without a heart monitor.  Patients get hands on experience counting their pulse with staff help and individually.   Heart Attack, Angina, and Risk Factor Modification:  -Group instruction provided by verbal instruction, video, and written materials to support subject.  Instructors address signs and symptoms of angina and heart attacks.    Also discuss risk factors for heart disease and how to make changes to improve heart health risk  factors.   Functional Fitness:  -Group instruction provided by verbal instruction, demonstration, patient participation, and written materials to support subject.  Instructors address safety measures for doing things around the house.  Discuss how to get up and down off the floor, how to pick things up properly, how to safely get out of a chair without assistance, and balance training.   Meditation and Mindfulness:  -Group instruction provided by verbal instruction, patient participation, and written materials to support subject.  Instructor addresses importance of mindfulness and meditation practice to help reduce stress  and improve awareness.  Instructor also leads participants through a meditation exercise.    Stretching for Flexibility and Mobility:  -Group instruction provided by verbal instruction, patient participation, and written materials to support subject.  Instructors lead participants through series of stretches that are designed to increase flexibility thus improving mobility.  These stretches are additional exercise for major muscle groups that are typically performed during regular warm up and cool down.   Hands Only CPR:  -Group verbal, video, and participation provides a basic overview of AHA guidelines for community CPR. Role-play of emergencies allow participants the opportunity to practice calling for help and chest compression technique with discussion of AED use.   Hypertension: -Group verbal and written instruction that provides a basic overview of hypertension including the most recent diagnostic guidelines, risk factor reduction with self-care instructions and medication management.    Nutrition I class: Heart Healthy Eating:  -Group instruction provided by PowerPoint slides, verbal discussion, and written materials to support subject matter. The instructor gives an explanation and review of the Therapeutic Lifestyle Changes diet recommendations, which includes a  discussion on lipid goals, dietary fat, sodium, fiber, plant stanol/sterol esters, sugar, and the components of a well-balanced, healthy diet.   Nutrition II class: Lifestyle Skills:  -Group instruction provided by PowerPoint slides, verbal discussion, and written materials to support subject matter. The instructor gives an explanation and review of label reading, grocery shopping for heart health, heart healthy recipe modifications, and ways to make healthier choices when eating out.   Diabetes Question & Answer:  -Group instruction provided by PowerPoint slides, verbal discussion, and written materials to support subject matter. The instructor gives an explanation and review of diabetes co-morbidities, pre- and post-prandial blood glucose goals, pre-exercise blood glucose goals, signs, symptoms, and treatment of hypoglycemia and hyperglycemia, and foot care basics.   Diabetes Blitz:  -Group instruction provided by PowerPoint slides, verbal discussion, and written materials to support subject matter. The instructor gives an explanation and review of the physiology behind type 1 and type 2 diabetes, diabetes medications and rational behind using different medications, pre- and post-prandial blood glucose recommendations and Hemoglobin A1c goals, diabetes diet, and exercise including blood glucose guidelines for exercising safely.    Portion Distortion:  -Group instruction provided by PowerPoint slides, verbal discussion, written materials, and food models to support subject matter. The instructor gives an explanation of serving size versus portion size, changes in portions sizes over the last 20 years, and what consists of a serving from each food group.   Stress Management:  -Group instruction provided by verbal instruction, video, and written materials to support subject matter.  Instructors review role of stress in heart disease and how to cope with stress positively.     Exercising on Your  Own:  -Group instruction provided by verbal instruction, power point, and written materials to support subject.  Instructors discuss benefits of exercise, components of exercise, frequency and intensity of exercise, and end points for exercise.  Also discuss use of nitroglycerin and activating EMS.  Review options of places to exercise outside of rehab.  Review guidelines for sex with heart disease.   Cardiac Drugs I:  -Group instruction provided by verbal instruction and written materials to support subject.  Instructor reviews cardiac drug classes: antiplatelets, anticoagulants, beta blockers, and statins.  Instructor discusses reasons, side effects, and lifestyle considerations for each drug class.   Cardiac Drugs II:  -Group instruction provided by verbal instruction and written materials to support subject.  Instructor  reviews cardiac drug classes: angiotensin converting enzyme inhibitors (ACE-I), angiotensin II receptor blockers (ARBs), nitrates, and calcium channel blockers.  Instructor discusses reasons, side effects, and lifestyle considerations for each drug class.   Anatomy and Physiology of the Circulatory System:  Group verbal and written instruction and models provide basic cardiac anatomy and physiology, with the coronary electrical and arterial systems. Review of: AMI, Angina, Valve disease, Heart Failure, Peripheral Artery Disease, Cardiac Arrhythmia, Pacemakers, and the ICD.   Other Education:  -Group or individual verbal, written, or video instructions that support the educational goals of the cardiac rehab program.   Knowledge Questionnaire Score:     Knowledge Questionnaire Score - 08/13/17 1020      Knowledge Questionnaire Score   Pre Score 19/24      Core Components/Risk Factors/Patient Goals at Admission:     Personal Goals and Risk Factors at Admission - 08/13/17 1040      Core Components/Risk Factors/Patient Goals on Admission    Weight Management  Yes;Weight Maintenance   Intervention Weight Management: Develop a combined nutrition and exercise program designed to reach desired caloric intake, while maintaining appropriate intake of nutrient and fiber, sodium and fats, and appropriate energy expenditure required for the weight goal.;Weight Management: Provide education and appropriate resources to help participant work on and attain dietary goals.;Weight Management/Obesity: Establish reasonable short term and long term weight goals.;Obesity: Provide education and appropriate resources to help participant work on and attain dietary goals.   Admit Weight 143 lb 1.3 oz (64.9 kg)   Expected Outcomes Short Term: Continue to assess and modify interventions until short term weight is achieved;Long Term: Adherence to nutrition and physical activity/exercise program aimed toward attainment of established weight goal;Weight Maintenance: Understanding of the daily nutrition guidelines, which includes 25-35% calories from fat, 7% or less cal from saturated fats, less than 200mg  cholesterol, less than 1.5gm of sodium, & 5 or more servings of fruits and vegetables daily;Weight Loss: Understanding of general recommendations for a balanced deficit meal plan, which promotes 1-2 lb weight loss per week and includes a negative energy balance of [phone] kcal/d;Understanding of distribution of calorie intake throughout the day with the consumption of 4-5 meals/snacks;Understanding recommendations for meals to include 15-35% energy as protein, 25-35% energy from fat, 35-60% energy from carbohydrates, less than 200mg  of dietary cholesterol, 20-35 gm of total fiber daily   Tobacco Cessation Yes   Number of packs per day 3 cigarettes per day and vape    Intervention Assist the participant in steps to quit. Provide individualized education and counseling about committing to Tobacco Cessation, relapse prevention, and pharmacological support that can be provided by  physician.;Advice worker, assist with locating and accessing local/national Quit Smoking programs, and support quit date choice.   Expected Outcomes Short Term: Will demonstrate readiness to quit, by selecting a quit date.;Short Term: Will quit all tobacco product use, adhering to prevention of relapse plan.;Long Term: Complete abstinence from all tobacco products for at least 12 months from quit date.   Improve shortness of breath with ADL's Yes   Intervention Provide education, individualized exercise plan and daily activity instruction to help decrease symptoms of SOB with activities of daily living.   Expected Outcomes Short Term: Achieves a reduction of symptoms when performing activities of daily living.      Core Components/Risk Factors/Patient Goals Review:    Core Components/Risk Factors/Patient Goals at Discharge (Final Review):    ITP Comments:     ITP Comments    Row  Name 08/13/17 1025           ITP Comments Medical Director, Dr. Fransico Him          Comments:  Patient attended orientation from 0815 to 1000 to review rules and guidelines for program. Completed 6 minute walk test, Intitial ITP, and exercise prescription.  VSS. Telemetry- SR with occasional PVC. Pt with generally fatigue with various musculoskeletal discomfort complaints with legs, knees and back.  Pt used rolater and had to stop several times to rest.  Pt is very deconditioned.  Pt with new symptoms of oral bleeding.  Pt is on two blood thinning agents due to recent coronary stent placement and DVT's. Pt has conversed with her cardiologist and GI doctor.  Pt had lab work completed which showed her HGB at 10.0 which is up from previous lab check.  Pt is to seek assistance from primary or urgent care since her symptoms do not appear to be GI related.  Pt is to start exercise on next Friday 9/28. Brief Psychosocial assessment reveal stressors related to family, finances and chronic illness -  multiple health related issues, A1C "14" range. Pt is a current smoker and reports she has decreased to 3 cigarettes daily along with vaping.  Advised pt on 1800 quit now. Pt will benefit from continued education on nutrition, diabetes and cardiac.  Pt is looking forward to returning next week for exercise. Cherre Huger, BSN Cardiac and Training and development officer

## 2017-08-13 NOTE — Telephone Encounter (Signed)
Her bleeding has variously been described as "spitting up blood, oral bleeding and coughing up blood" and so is probably NOT related to her GI tract so I'm not sure that I'm really best to advise her on a plan for it.  Her Hb is better and she's on twice daily PPI for erosive esophagitis (noted on EGD 2 months ago).  Seems more likely that she has a dental issue, ENT issue or pulmonary issue.    Please send word that she needs to see her PCP or urgent care today.  If that provider feels that it is more likely GI bleeding then we need to get her in the office asap.   thanks

## 2017-08-13 NOTE — Telephone Encounter (Signed)
Carlette please see Dr Ardis Hughs note, thanks

## 2017-08-13 NOTE — Telephone Encounter (Signed)
The pt was given the information Dr Ardis Hughs relayed to Maurice Small RN Cardiac Pulmonary Rehab. She will call that office for further recommendations.

## 2017-08-17 ENCOUNTER — Encounter (HOSPITAL_COMMUNITY): Payer: Medicare Other

## 2017-08-19 ENCOUNTER — Encounter (HOSPITAL_COMMUNITY): Payer: Medicare Other

## 2017-08-21 ENCOUNTER — Telehealth: Payer: Self-pay | Admitting: Cardiology

## 2017-08-21 ENCOUNTER — Encounter (HOSPITAL_COMMUNITY)
Admission: RE | Admit: 2017-08-21 | Discharge: 2017-08-21 | Disposition: A | Discharge status: Home / Self Care | Payer: Medicare Other | Source: Ambulatory Visit | Attending: Cardiology | Admitting: Cardiology

## 2017-08-21 DIAGNOSIS — Z955 Presence of coronary angioplasty implant and graft: Secondary | ICD-10-CM

## 2017-08-21 LAB — GLUCOSE, CAPILLARY: Glucose-Capillary: 377 mg/dL — ABNORMAL HIGH (ref 65–99)

## 2017-08-21 NOTE — Telephone Encounter (Signed)
Spoke with Alejandra Hernandez at cardiac rehab. Patient actually took 2 NTG on 08/17/17 not 07/17/17. Blood sugar over 400 today.  Called and spoke with patient and she did have chest pain in the middle of the night and took 2 NTG before pain subsided. No chest pain since Has been having shortness of breath with exertion but is being seen by Dr Lamonte Sakai for pulmonary issues.  Patient will need clearance to return to cardiac rehab   Will forward to [place] PA and Dr Radford Pax for review

## 2017-08-21 NOTE — Telephone Encounter (Signed)
Left message to call back  

## 2017-08-21 NOTE — Telephone Encounter (Signed)
New message   Alejandra Hernandez verbalized that 07/17/2017 pt took nitroglycerin and joanne   wants to speak to a rn before she starts back exercising she will not exercise today  No issues today, still SOB when exertion   She is sitting still now and not sob

## 2017-08-21 NOTE — Progress Notes (Addendum)
Incomplete Session Note  Patient Details  Name: Alejandra Hernandez MRN: [id number] Date of Birth: 06/28/1956 Referring Provider:     CARDIAC REHAB PHASE II ORIENTATION from 08/13/2017 in [place]  Referring Provider  Fransico Him MD      Murlean Hark did not exercise.   Problem 1:   Pt c/o NTG x2 for chest pain, dyspnea on 08/17/2017. Pt reports symptoms were relieved with sitting up. Pt denies other episodes this week. Pt reports she is being treated by pulmonologist and awaiting further testing. PC to Dr. Radford Pax to obtain clearance to exercise.  BP: 140/70, sinus rhythm, 02sat-97%.  Spoke to [place] [facility] to inform of pt symptoms and request clearance to exercise. Rip Harbour will contact pt and further advise.  Problem 2:  Pt CBG-377. Pt reports home CBG-419.  She took humalog 5 units as directed.  Pt admits to eating frequent cakes and cookies, due to food preferences and difficulty chewing. PC to Dr. Deforest Hoyles. Per Dr. Deforest Hoyles, pt instructed to call his cell  [phone] if CBG >300 for recommendation, check CBG twice daily  and bring log to office visit with him this week. Written and verbal instructions given.  Pt verbalized understanding. Andi Hence, RN, BSN Cardiac Pulmonary Rehab

## 2017-08-24 ENCOUNTER — Encounter (HOSPITAL_COMMUNITY): Payer: Medicare Other

## 2017-08-24 MED ORDER — ISOSORBIDE MONONITRATE ER 30 MG PO TB24: 90.0000 mg | ORAL_TABLET | Freq: Every day | ORAL | 11 refills | Status: DC

## 2017-08-24 NOTE — Telephone Encounter (Signed)
Called and made patient aware of Dr. Theodosia Blender recommendations to increase imdur to 90 mg daily and that she can resume cardiac rehab. Patient verbalized understanding. Appointment made with Ermalinda Barrios on 10/23.

## 2017-08-24 NOTE — Telephone Encounter (Signed)
Increase Imdur to 90mg  daily and resume Cardiac rehab - followup with PA in 2-3 weeks

## 2017-08-26 ENCOUNTER — Encounter (HOSPITAL_COMMUNITY): Payer: Medicare Other

## 2017-08-28 ENCOUNTER — Encounter (HOSPITAL_COMMUNITY): Payer: Medicare Other

## 2017-08-31 ENCOUNTER — Encounter (HOSPITAL_COMMUNITY): Payer: Medicare Other

## 2017-09-02 ENCOUNTER — Encounter (HOSPITAL_COMMUNITY): Payer: Medicare Other

## 2017-09-04 ENCOUNTER — Encounter (HOSPITAL_COMMUNITY): Payer: Medicare Other

## 2017-09-07 ENCOUNTER — Encounter (HOSPITAL_COMMUNITY): Admission: RE | Admit: 2017-09-07 | Payer: Medicare Other | Source: Ambulatory Visit

## 2017-09-08 ENCOUNTER — Ambulatory Visit (INDEPENDENT_AMBULATORY_CARE_PROVIDER_SITE_OTHER)
Admission: RE | Admit: 2017-09-08 | Discharge: 2017-09-08 | Disposition: A | Discharge status: Home / Self Care | Payer: Medicare Other | Source: Ambulatory Visit | Attending: Emergency Medicine | Admitting: Emergency Medicine

## 2017-09-08 DIAGNOSIS — R918 Other nonspecific abnormal finding of lung field: Secondary | ICD-10-CM

## 2017-09-08 DIAGNOSIS — K769 Liver disease, unspecified: Secondary | ICD-10-CM

## 2017-09-09 ENCOUNTER — Encounter (HOSPITAL_COMMUNITY): Payer: Medicare Other

## 2017-09-11 ENCOUNTER — Encounter (HOSPITAL_COMMUNITY): Payer: Medicare Other

## 2017-09-14 ENCOUNTER — Encounter (HOSPITAL_COMMUNITY): Payer: Medicare Other

## 2017-09-14 ENCOUNTER — Telehealth: Payer: Self-pay | Admitting: Cardiology

## 2017-09-14 NOTE — Telephone Encounter (Signed)
Called patient back. Was not sure who called patient. Patient stated someone might had called her about her CT. Dr. Lamonte Sakai ordered CT. Encouraged patient to call his office to see if they called about her CT results. Patient verbalized understanding.

## 2017-09-14 NOTE — Telephone Encounter (Signed)
Follow Up:     Returning call from this morning,she said she did not know who called.

## 2017-09-15 ENCOUNTER — Ambulatory Visit: Payer: Medicare Other | Admitting: Physician Assistant

## 2017-09-16 ENCOUNTER — Encounter (HOSPITAL_COMMUNITY): Payer: Medicare Other

## 2017-09-18 ENCOUNTER — Encounter (HOSPITAL_COMMUNITY): Admission: RE | Admit: 2017-09-18 | Payer: Medicare Other | Source: Ambulatory Visit

## 2017-09-21 ENCOUNTER — Telehealth: Payer: Self-pay | Admitting: Emergency Medicine

## 2017-09-21 ENCOUNTER — Encounter (HOSPITAL_COMMUNITY): Payer: Medicare Other

## 2017-09-21 NOTE — Telephone Encounter (Signed)
Also let her know that no liver lesions were seen on the abdominal scan. Thanks./

## 2017-09-21 NOTE — Telephone Encounter (Signed)
Please let her know that her lung opacity has almost completely resolved. This is most consistent with a resolved pneumonia. Good news.

## 2017-09-21 NOTE — Telephone Encounter (Signed)
RB pt is calling to get the results of her CT scan from last week. Please advise. Thanks

## 2017-09-21 NOTE — Telephone Encounter (Signed)
Advised pt of results. Pt understood and nothing further is needed.   

## 2017-09-23 ENCOUNTER — Encounter (HOSPITAL_COMMUNITY): Payer: Medicare Other

## 2017-09-25 ENCOUNTER — Encounter (HOSPITAL_COMMUNITY): Payer: Medicare Other

## 2017-09-28 ENCOUNTER — Encounter (HOSPITAL_COMMUNITY): Payer: Medicare Other

## 2017-09-30 ENCOUNTER — Encounter (HOSPITAL_COMMUNITY): Payer: Medicare Other

## 2017-09-30 NOTE — Progress Notes (Signed)
Discharge Progress Report  Patient Details  Name: Alejandra Hernandez MRN: [id number] Date of Birth: 10/04/1956 Referring Provider:     CARDIAC REHAB PHASE II ORIENTATION from 08/13/2017 in [facility]  Referring Provider  Fransico Him MD       Number of Visits: 1 Reason for Discharge:  Medical Hold   Smoking History:  Social History   Tobacco Use  Smoking Status Current Some Day Smoker  . Packs/day: 0.50  . Years: 40.00  . Pack years: 20.00  . Types: Cigarettes, E-cigarettes  Smokeless Tobacco Former Systems developer  . Quit date: 11/24/1972    Diagnosis:  Stented coronary artery  ADL UCSD:   Initial Exercise Prescription: Initial Exercise Prescription - 08/13/17 1000      Date of Initial Exercise RX and Referring Provider   Date  08/13/17    Referring Provider  Fransico Him MD      Recumbant Bike   Level  1    Watts  5    Minutes  15    METs  1.5      NuStep   Level  1    SPM  60    Minutes  15    METs  1      Track   Laps  5    Minutes  15    METs  1.5      Prescription Details   Frequency (times per week)  3    Duration  Progress to 30 minutes of continuous aerobic without signs/symptoms of physical distress      Intensity   THRR 40-80% of Max Heartrate  64-128    Ratings of Perceived Exertion  11-15    Perceived Dyspnea  0-4      Progression   Progression  Continue to progress workloads to maintain intensity without signs/symptoms of physical distress.      Resistance Training   Training Prescription  Yes    Weight  1    Reps  10-15       Discharge Exercise Prescription (Final Exercise Prescription Changes):   Functional Capacity: 6 Minute Walk    Row Name 08/13/17 1013         6 Minute Walk   Phase  Initial     Distance  377 feet     Walk Time  4.21 minutes     # of Rest Breaks  2 1: for 19 seconds 2: for 1 min and 20 seconds     MPH  1.01     METS  1.5     RPE  13     Perceived Dyspnea   1     VO2 Peak   5.25     Symptoms  Yes (comment)     Comments  SOB/fatigue: pt took 2 rest breaks- first break for 19 seconds and second break for 1 minute and 20 seconds     Resting HR  65 bpm     Resting BP  108/66     Resting Oxygen Saturation   95 %     Exercise Oxygen Saturation  during 6 min walk  95 %     Max Ex. HR  75 bpm     Max Ex. BP  120/66     2 Minute Post BP  114/62        Psychological, QOL, Others - Outcomes: PHQ 2/9: Depression screen [facility] 2/9 06/22/2017 04/17/2017 03/02/2017 01/23/2017 05/14/2016  Decreased Interest 0  0 0 0 0  Down, Depressed, Hopeless 0 0 0 0 0  PHQ - 2 Score 0 0 0 0 0    Quality of Life: Quality of Life - 08/13/17 1024      Quality of Life Scores   Health/Function Pre  23.79 %    Socioeconomic Pre  25.2 %    Psych/Spiritual Pre  27.21 %    Family Pre  28.5 %    GLOBAL Pre  25.57 %       Personal Goals: Goals established at orientation with interventions provided to work toward goal. Personal Goals and Risk Factors at Admission - 08/13/17 1040      Core Components/Risk Factors/Patient Goals on Admission    Weight Management  Yes;Weight Maintenance    Intervention  Weight Management: Develop a combined nutrition and exercise program designed to reach desired caloric intake, while maintaining appropriate intake of nutrient and fiber, sodium and fats, and appropriate energy expenditure required for the weight goal.;Weight Management: Provide education and appropriate resources to help participant work on and attain dietary goals.;Weight Management/Obesity: Establish reasonable short term and long term weight goals.;Obesity: Provide education and appropriate resources to help participant work on and attain dietary goals.    Admit Weight  143 lb 1.3 oz (64.9 kg)    Expected Outcomes  Short Term: Continue to assess and modify interventions until short term weight is achieved;Long Term: Adherence to nutrition and physical activity/exercise program aimed toward  attainment of established weight goal;Weight Maintenance: Understanding of the daily nutrition guidelines, which includes 25-35% calories from fat, 7% or less cal from saturated fats, less than 200mg  cholesterol, less than 1.5gm of sodium, & 5 or more servings of fruits and vegetables daily;Weight Loss: Understanding of general recommendations for a balanced deficit meal plan, which promotes 1-2 lb weight loss per week and includes a negative energy balance of [phone] kcal/d;Understanding of distribution of calorie intake throughout the day with the consumption of 4-5 meals/snacks;Understanding recommendations for meals to include 15-35% energy as protein, 25-35% energy from fat, 35-60% energy from carbohydrates, less than 200mg  of dietary cholesterol, 20-35 gm of total fiber daily    Tobacco Cessation  Yes    Number of packs per day  3 cigarettes per day and vape     Intervention  Assist the participant in steps to quit. Provide individualized education and counseling about committing to Tobacco Cessation, relapse prevention, and pharmacological support that can be provided by physician.;Advice worker, assist with locating and accessing local/national Quit Smoking programs, and support quit date choice.    Expected Outcomes  Short Term: Will demonstrate readiness to quit, by selecting a quit date.;Short Term: Will quit all tobacco product use, adhering to prevention of relapse plan.;Long Term: Complete abstinence from all tobacco products for at least 12 months from quit date.    Improve shortness of breath with ADL's  Yes    Intervention  Provide education, individualized exercise plan and daily activity instruction to help decrease symptoms of SOB with activities of daily living.    Expected Outcomes  Short Term: Achieves a reduction of symptoms when performing activities of daily living.        Personal Goals Discharge:   Exercise Goals and Review: Exercise Goals    Row Name  08/13/17 0906             Exercise Goals   Increase Physical Activity  Yes       Intervention  Provide advice, education, support  and counseling about physical activity/exercise needs.;Develop an individualized exercise prescription for aerobic and resistive training based on initial evaluation findings, risk stratification, comorbidities and participant's personal goals.       Expected Outcomes  Achievement of increased cardiorespiratory fitness and enhanced flexibility, muscular endurance and strength shown through measurements of functional capacity and personal statement of participant.       Increase Strength and Stamina  Yes       Intervention  Provide advice, education, support and counseling about physical activity/exercise needs.;Develop an individualized exercise prescription for aerobic and resistive training based on initial evaluation findings, risk stratification, comorbidities and participant's personal goals.       Expected Outcomes  Achievement of increased cardiorespiratory fitness and enhanced flexibility, muscular endurance and strength shown through measurements of functional capacity and personal statement of participant.       Able to understand and use rate of perceived exertion (RPE) scale  Yes       Intervention  Provide education and explanation on how to use RPE scale       Expected Outcomes  Short Term: Able to use RPE daily in rehab to express subjective intensity level;Long Term:  Able to use RPE to guide intensity level when exercising independently       Able to understand and use Dyspnea scale  Yes       Intervention  Provide education and explanation on how to use Dyspnea scale       Expected Outcomes  Short Term: Able to use Dyspnea scale daily in rehab to express subjective sense of shortness of breath during exertion;Long Term: Able to use Dyspnea scale to guide intensity level when exercising independently       Knowledge and understanding of Target Heart  Rate Range (THRR)  Yes       Intervention  Provide education and explanation of THRR including how the numbers were predicted and where they are located for reference       Expected Outcomes  Short Term: Able to state/look up THRR;Long Term: Able to use THRR to govern intensity when exercising independently;Short Term: Able to use daily as guideline for intensity in rehab       Able to check pulse independently  Yes       Intervention  Provide education and demonstration on how to check pulse in carotid and radial arteries.;Review the importance of being able to check your own pulse for safety during independent exercise       Expected Outcomes  Short Term: Able to explain why pulse checking is important during independent exercise;Long Term: Able to check pulse independently and accurately       Understanding of Exercise Prescription  Yes       Intervention  Provide education, explanation, and written materials on patient's individual exercise prescription       Expected Outcomes  Short Term: Able to explain program exercise prescription          Nutrition & Weight - Outcomes: Pre Biometrics - 08/13/17 1036      Pre Biometrics   % Body Fat  39.4 %        Nutrition: Nutrition Therapy & Goals - 08/13/17 1101      Nutrition Therapy   Diet  Carb Modified, Therapeutic Lifestyle Changes      Personal Nutrition Goals   Nutrition Goal  Pt to identify food quantities necessary to achieve wt gain of 2-5 lb to a goal wt of 145-150 lb at  graduation from Cardiac Rehab.     Personal Goal #2  Improved glycemic control as evidenced by patient's A1c trending toward 7.0 or less. Per pt, A1c was 14 after recent re-check; previous A1c was 12.8    Personal Goal #3  Pt to identify and limit food sources of saturated fat, trans fat, and sodium once goal wt achieved      Intervention Plan   Intervention  Prescribe, educate and counsel regarding individualized specific dietary modifications aiming towards  targeted core components such as weight, hypertension, lipid management, diabetes, heart failure and other comorbidities.    Expected Outcomes  Short Term Goal: Understand basic principles of dietary content, such as calories, fat, sodium, cholesterol and nutrients.;Long Term Goal: Adherence to prescribed nutrition plan.       Nutrition Discharge: Nutrition Assessments - 08/13/17 1052      MEDFICTS Scores   Pre Score  101       Education Questionnaire Score: Knowledge Questionnaire Score - 08/13/17 1020      Knowledge Questionnaire Score   Pre Score  19/24       Goals reviewed with patient; copy given to patient.

## 2017-09-30 NOTE — Addendum Note (Signed)
Encounter addended by: Lowell Guitar, RN on: 09/30/2017 3:33 PM  Actions taken: Sign clinical note

## 2017-10-02 ENCOUNTER — Encounter (HOSPITAL_COMMUNITY): Payer: Medicare Other

## 2017-10-05 ENCOUNTER — Encounter (HOSPITAL_COMMUNITY): Payer: Medicare Other
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
## 2017-10-06 ENCOUNTER — Ambulatory Visit (INDEPENDENT_AMBULATORY_CARE_PROVIDER_SITE_OTHER): Payer: Medicare Other | Admitting: Physician Assistant

## 2017-10-06 ENCOUNTER — Encounter: Payer: Self-pay | Admitting: Physician Assistant

## 2017-10-06 VITALS — BP 126/60 | HR 80 | Ht 62.0 in | Wt 150.8 lb

## 2017-10-06 DIAGNOSIS — I25110 Atherosclerotic heart disease of native coronary artery with unstable angina pectoris: Secondary | ICD-10-CM

## 2017-10-06 DIAGNOSIS — I10 Essential (primary) hypertension: Secondary | ICD-10-CM

## 2017-10-06 DIAGNOSIS — I739 Peripheral vascular disease, unspecified: Secondary | ICD-10-CM

## 2017-10-06 DIAGNOSIS — I5032 Chronic diastolic (congestive) heart failure: Secondary | ICD-10-CM

## 2017-10-06 DIAGNOSIS — Z87891 Personal history of nicotine dependence: Secondary | ICD-10-CM | POA: Diagnosis not present

## 2017-10-06 DIAGNOSIS — I6523 Occlusion and stenosis of bilateral carotid arteries: Secondary | ICD-10-CM | POA: Diagnosis not present

## 2017-10-06 NOTE — Patient Instructions (Signed)
Medication Instructions:Your physician recommends that you continue on your current medications as directed. Please refer to the Current Medication list given to you today.  Labwork: None Ordered  Procedures/Testing: None Ordered  Follow-Up: Your physician recommends that you schedule a follow-up appointment in: December or January with Dr. Heron Nay  Your physician recommends that you schedule a follow-up appointment in: January or February with Dr. Fletcher Anon.    Any Additional Special Instructions Will Be Listed Below (If Applicable).  -- You are cleared to return to Cardiac Rehab  If you need a refill on your cardiac medications before your next appointment, please call your pharmacy.

## 2017-10-07 ENCOUNTER — Encounter (HOSPITAL_COMMUNITY): Payer: Medicare Other

## 2017-10-09 ENCOUNTER — Encounter (HOSPITAL_COMMUNITY): Payer: Medicare Other

## 2017-10-12 ENCOUNTER — Encounter (HOSPITAL_COMMUNITY): Payer: Medicare Other

## 2017-10-12 ENCOUNTER — Other Ambulatory Visit: Payer: Self-pay | Admitting: Student in an Organized Health Care Education/Training Program

## 2017-10-14 ENCOUNTER — Encounter (HOSPITAL_COMMUNITY): Payer: Medicare Other

## 2017-10-19 ENCOUNTER — Encounter (HOSPITAL_COMMUNITY): Payer: Medicare Other

## 2017-10-21 ENCOUNTER — Encounter (HOSPITAL_COMMUNITY): Payer: Medicare Other

## 2017-10-23 ENCOUNTER — Encounter (HOSPITAL_COMMUNITY): Payer: Medicare Other

## 2017-10-26 ENCOUNTER — Encounter (HOSPITAL_COMMUNITY): Payer: Medicare Other

## 2017-10-28 ENCOUNTER — Encounter (HOSPITAL_COMMUNITY): Payer: Medicare Other

## 2017-10-30 ENCOUNTER — Telehealth (HOSPITAL_COMMUNITY): Payer: Self-pay | Admitting: *Deleted

## 2017-10-30 ENCOUNTER — Encounter (HOSPITAL_COMMUNITY): Payer: Medicare Other

## 2017-10-30 NOTE — Telephone Encounter (Signed)
Returned call to pt from message left on yesterday.  Per patient, she has been cleared to return to cardiac rehab but "I am having problems with my legs and will need a stent".  Pt stated her appt for that won't be until February.  Pt will see Turner in January.  Pt will call back after she is seen in February (pt hopes to get an earlier appt).  Thanked Government social research officer for calling. Cherre Huger, BSN Cardiac and Training and development officer

## 2017-10-31 ENCOUNTER — Other Ambulatory Visit: Payer: Self-pay | Admitting: Cardiology

## 2017-11-02 ENCOUNTER — Encounter (HOSPITAL_COMMUNITY): Payer: Medicare Other

## 2017-11-04 ENCOUNTER — Encounter (HOSPITAL_COMMUNITY): Payer: Medicare Other

## 2017-11-05 ENCOUNTER — Other Ambulatory Visit: Payer: Self-pay | Admitting: Cardiology

## 2017-11-08 ENCOUNTER — Other Ambulatory Visit: Payer: Self-pay | Admitting: Student in an Organized Health Care Education/Training Program

## 2017-11-12 ENCOUNTER — Other Ambulatory Visit: Payer: Self-pay | Admitting: *Deleted

## 2017-11-12 MED ORDER — RIVAROXABAN 20 MG PO TABS: 20.0000 mg | ORAL_TABLET | Freq: Every day | ORAL | 5 refills | Status: DC

## 2017-11-12 NOTE — Telephone Encounter (Signed)
Patient called to request refill and stated that she has been without this medication for a few days.

## 2017-11-12 NOTE — Telephone Encounter (Signed)
Age 61years Wt 68.4kg 10/06/2017 Saw Ermalinda Barrios 10/06/2017 Has appt to see Dr Radford Pax 12/11/2017  08/12/2017  Hgb 10 HCT 32.9 06/02/2017 SrCr 1.19 CrCl 53.6 Refill done for Xarelto 20 mg daily as requested
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
## 2017-12-11 ENCOUNTER — Ambulatory Visit (INDEPENDENT_AMBULATORY_CARE_PROVIDER_SITE_OTHER): Payer: Medicare Other | Admitting: Cardiology

## 2017-12-11 ENCOUNTER — Encounter: Payer: Self-pay | Admitting: Cardiology

## 2017-12-11 VITALS — BP 142/66 | HR 65 | Ht 63.0 in | Wt 155.4 lb

## 2017-12-11 DIAGNOSIS — E785 Hyperlipidemia, unspecified: Secondary | ICD-10-CM | POA: Diagnosis not present

## 2017-12-11 DIAGNOSIS — I6523 Occlusion and stenosis of bilateral carotid arteries: Secondary | ICD-10-CM

## 2017-12-11 DIAGNOSIS — I10 Essential (primary) hypertension: Secondary | ICD-10-CM

## 2017-12-11 DIAGNOSIS — R079 Chest pain, unspecified: Secondary | ICD-10-CM

## 2017-12-11 DIAGNOSIS — I25110 Atherosclerotic heart disease of native coronary artery with unstable angina pectoris: Secondary | ICD-10-CM | POA: Diagnosis not present

## 2017-12-11 DIAGNOSIS — I5032 Chronic diastolic (congestive) heart failure: Secondary | ICD-10-CM

## 2017-12-11 LAB — LIPID PANEL
CHOLESTEROL TOTAL: 160 mg/dL (ref 100–199)
Chol/HDL Ratio: 3.6 ratio (ref 0.0–4.4)
HDL: 45 mg/dL (ref >39)
LDL Calculated: 85 mg/dL (ref 0–99)
TRIGLYCERIDES: 152 mg/dL — AB (ref 0–149)
VLDL Cholesterol Cal: 30 mg/dL (ref 5–40)

## 2017-12-11 LAB — BASIC METABOLIC PANEL
BUN / CREAT RATIO: 9 — AB (ref 12–28)
BUN: 13 mg/dL (ref 8–27)
CO2: 20 mmol/L (ref 20–29)
CREATININE: 1.4 mg/dL — AB (ref 0.57–1.00)
Calcium: 9.3 mg/dL (ref 8.7–10.3)
Chloride: 99 mmol/L (ref 96–106)
GFR calc non Af Amer: 41 mL/min/{1.73_m2} — ABNORMAL LOW (ref >59)
GFR, EST AFRICAN AMERICAN: 47 mL/min/{1.73_m2} — AB (ref >59)
GLUCOSE: 323 mg/dL — AB (ref 65–99)
Potassium: 4.5 mmol/L (ref 3.5–5.2)
SODIUM: 134 mmol/L (ref 134–144)

## 2017-12-11 LAB — HEPATIC FUNCTION PANEL
ALT: 23 IU/L (ref 0–32)
AST: 24 IU/L (ref 0–40)
Albumin: 4.1 g/dL (ref 3.6–4.8)
Alkaline Phosphatase: 140 IU/L — ABNORMAL HIGH (ref 39–117)
Bilirubin Total: <0.2 mg/dL (ref 0.0–1.2)
Bilirubin, Direct: 0.07 mg/dL (ref 0.00–0.40)
Total Protein: 6.7 g/dL (ref 6.0–8.5)

## 2017-12-11 MED ORDER — ISOSORBIDE MONONITRATE ER 120 MG PO TB24: 120.0000 mg | ORAL_TABLET | Freq: Every day | ORAL | 1 refills | Status: DC

## 2017-12-11 NOTE — Patient Instructions (Addendum)
Medication Instructions:  Your physician has recommended you make the following change in your medication:   INCREASE: Imdur 120 mg (1 tab) once a day    Labwork: Today for kidney function, liver function and fasting lipids  Testing/Procedures: Your physician has requested that you have a lexiscan myoview. For further information please visit [email or web address]. Please follow instruction sheet, as given.  Your physician has requested that you have a carotid duplex in July 2019. This test is an ultrasound of the carotid arteries in your neck. It looks at blood flow through these arteries that supply the brain with blood. Allow one hour for this exam. There are no restrictions or special instructions.   Follow-Up: Your physician recommends that you schedule a follow-up appointment in: 2 weeks with PA.   Your physician wants you to follow-up in: 6 months with Dr. Radford Pax. You will receive a reminder letter in the mail two months in advance. If you don't receive a letter, please call our office to schedule the follow-up appointment.   Any Other Special Instructions Will Be Listed Below (If Applicable).     If you need a refill on your cardiac medications before your next appointment, please call your pharmacy.

## 2017-12-15 ENCOUNTER — Telehealth (HOSPITAL_COMMUNITY): Payer: Self-pay | Admitting: *Deleted

## 2017-12-15 NOTE — Telephone Encounter (Signed)
Left message on voicemail per DPR in reference to upcoming appointment scheduled on 12/18/17 with detailed instructions given per Myocardial Perfusion Study Information Sheet for the test. LM to arrive 15 minutes early, and that it is imperative to arrive on time for appointment to keep from having the test rescheduled. If you need to cancel or reschedule your appointment, please call the office within 24 hours of your appointment. Failure to do so may result in a cancellation of your appointment, and a $50 no show fee. Phone number given for call back for any questions. Kirstie Peri

## 2017-12-16 ENCOUNTER — Encounter (HOSPITAL_COMMUNITY): Payer: Medicare Other

## 2017-12-18 ENCOUNTER — Ambulatory Visit (HOSPITAL_COMMUNITY): Payer: Medicare Other | Attending: Internal Medicine

## 2017-12-18 DIAGNOSIS — R079 Chest pain, unspecified: Secondary | ICD-10-CM | POA: Diagnosis present

## 2017-12-18 DIAGNOSIS — I5032 Chronic diastolic (congestive) heart failure: Secondary | ICD-10-CM | POA: Insufficient documentation

## 2017-12-18 DIAGNOSIS — R110 Nausea: Secondary | ICD-10-CM

## 2017-12-18 DIAGNOSIS — I25110 Atherosclerotic heart disease of native coronary artery with unstable angina pectoris: Secondary | ICD-10-CM | POA: Insufficient documentation

## 2017-12-18 LAB — MYOCARDIAL PERFUSION IMAGING
CHL CUP NUCLEAR SDS: 1
CHL CUP NUCLEAR SRS: 4
CHL CUP NUCLEAR SSS: 5
CSEPPHR: 82 {beats}/min
LV dias vol: 86 mL (ref 46–106)
LV sys vol: 36 mL
RATE: 0.36
Rest HR: 60 {beats}/min
TID: 1.03

## 2017-12-18 MED ORDER — TECHNETIUM TC 99M TETROFOSMIN IV KIT
31.5000 | PACK | Freq: Once | INTRAVENOUS | Status: AC | PRN
  Administered 2017-12-18: 31.5 via INTRAVENOUS
  Filled 2017-12-18: qty 32

## 2017-12-18 MED ORDER — REGADENOSON 0.4 MG/5ML IV SOLN
0.4000 mg | Freq: Once | INTRAVENOUS | Status: AC
  Administered 2017-12-18: 0.4 mg via INTRAVENOUS

## 2017-12-18 MED ORDER — TECHNETIUM TC 99M TETROFOSMIN IV KIT
10.5000 | PACK | Freq: Once | INTRAVENOUS | Status: AC | PRN
  Administered 2017-12-18: 10.5 via INTRAVENOUS
  Filled 2017-12-18: qty 11

## 2017-12-18 MED ORDER — AMINOPHYLLINE 25 MG/ML IV SOLN
150.0000 mg | Freq: Once | INTRAVENOUS | Status: AC
  Administered 2017-12-18: 150 mg via INTRAVENOUS

## 2017-12-23 ENCOUNTER — Encounter (HOSPITAL_COMMUNITY): Payer: Self-pay | Admitting: Internal Medicine

## 2017-12-23 ENCOUNTER — Observation Stay (HOSPITAL_COMMUNITY): Payer: Medicare Other

## 2017-12-23 ENCOUNTER — Other Ambulatory Visit: Payer: Self-pay

## 2017-12-23 ENCOUNTER — Emergency Department (HOSPITAL_COMMUNITY): Payer: Medicare Other

## 2017-12-23 ENCOUNTER — Observation Stay (HOSPITAL_COMMUNITY)
Admission: EM | Admit: 2017-12-23 | Discharge: 2017-12-24 | Disposition: A | Discharge status: Home / Self Care | Payer: Medicare Other | Attending: Internal Medicine | Admitting: Internal Medicine

## 2017-12-23 DIAGNOSIS — R739 Hyperglycemia, unspecified: Secondary | ICD-10-CM | POA: Diagnosis not present

## 2017-12-23 DIAGNOSIS — F17290 Nicotine dependence, other tobacco product, uncomplicated: Secondary | ICD-10-CM

## 2017-12-23 DIAGNOSIS — N183 Chronic kidney disease, stage 3 (moderate): Secondary | ICD-10-CM | POA: Insufficient documentation

## 2017-12-23 DIAGNOSIS — E1165 Type 2 diabetes mellitus with hyperglycemia: Secondary | ICD-10-CM | POA: Diagnosis not present

## 2017-12-23 DIAGNOSIS — Z955 Presence of coronary angioplasty implant and graft: Secondary | ICD-10-CM | POA: Diagnosis not present

## 2017-12-23 DIAGNOSIS — Z86718 Personal history of other venous thrombosis and embolism: Secondary | ICD-10-CM

## 2017-12-23 DIAGNOSIS — E1110 Type 2 diabetes mellitus with ketoacidosis without coma: Secondary | ICD-10-CM

## 2017-12-23 DIAGNOSIS — R042 Hemoptysis: Secondary | ICD-10-CM | POA: Diagnosis not present

## 2017-12-23 DIAGNOSIS — N189 Chronic kidney disease, unspecified: Secondary | ICD-10-CM | POA: Diagnosis not present

## 2017-12-23 DIAGNOSIS — E785 Hyperlipidemia, unspecified: Secondary | ICD-10-CM | POA: Insufficient documentation

## 2017-12-23 DIAGNOSIS — K920 Hematemesis: Secondary | ICD-10-CM

## 2017-12-23 DIAGNOSIS — Z8249 Family history of ischemic heart disease and other diseases of the circulatory system: Secondary | ICD-10-CM

## 2017-12-23 DIAGNOSIS — Z7902 Long term (current) use of antithrombotics/antiplatelets: Secondary | ICD-10-CM | POA: Diagnosis not present

## 2017-12-23 DIAGNOSIS — R0600 Dyspnea, unspecified: Secondary | ICD-10-CM | POA: Diagnosis not present

## 2017-12-23 DIAGNOSIS — F419 Anxiety disorder, unspecified: Secondary | ICD-10-CM

## 2017-12-23 DIAGNOSIS — Z8719 Personal history of other diseases of the digestive system: Secondary | ICD-10-CM

## 2017-12-23 DIAGNOSIS — F17210 Nicotine dependence, cigarettes, uncomplicated: Secondary | ICD-10-CM | POA: Diagnosis not present

## 2017-12-23 DIAGNOSIS — Z794 Long term (current) use of insulin: Secondary | ICD-10-CM | POA: Insufficient documentation

## 2017-12-23 DIAGNOSIS — I200 Unstable angina: Secondary | ICD-10-CM

## 2017-12-23 DIAGNOSIS — I10 Essential (primary) hypertension: Secondary | ICD-10-CM | POA: Diagnosis present

## 2017-12-23 DIAGNOSIS — E1122 Type 2 diabetes mellitus with diabetic chronic kidney disease: Secondary | ICD-10-CM | POA: Insufficient documentation

## 2017-12-23 DIAGNOSIS — J189 Pneumonia, unspecified organism: Principal | ICD-10-CM

## 2017-12-23 DIAGNOSIS — I739 Peripheral vascular disease, unspecified: Secondary | ICD-10-CM | POA: Diagnosis not present

## 2017-12-23 DIAGNOSIS — I2510 Atherosclerotic heart disease of native coronary artery without angina pectoris: Secondary | ICD-10-CM | POA: Insufficient documentation

## 2017-12-23 DIAGNOSIS — I5032 Chronic diastolic (congestive) heart failure: Secondary | ICD-10-CM

## 2017-12-23 DIAGNOSIS — Z833 Family history of diabetes mellitus: Secondary | ICD-10-CM

## 2017-12-23 DIAGNOSIS — Z720 Tobacco use: Secondary | ICD-10-CM

## 2017-12-23 DIAGNOSIS — R000 Tachycardia, unspecified: Secondary | ICD-10-CM | POA: Diagnosis not present

## 2017-12-23 DIAGNOSIS — I129 Hypertensive chronic kidney disease with stage 1 through stage 4 chronic kidney disease, or unspecified chronic kidney disease: Secondary | ICD-10-CM | POA: Insufficient documentation

## 2017-12-23 DIAGNOSIS — E1151 Type 2 diabetes mellitus with diabetic peripheral angiopathy without gangrene: Secondary | ICD-10-CM

## 2017-12-23 DIAGNOSIS — R0789 Other chest pain: Secondary | ICD-10-CM

## 2017-12-23 DIAGNOSIS — I779 Disorder of arteries and arterioles, unspecified: Secondary | ICD-10-CM | POA: Diagnosis present

## 2017-12-23 DIAGNOSIS — Z87891 Personal history of nicotine dependence: Secondary | ICD-10-CM

## 2017-12-23 DIAGNOSIS — Z736 Limitation of activities due to disability: Secondary | ICD-10-CM

## 2017-12-23 DIAGNOSIS — N179 Acute kidney failure, unspecified: Secondary | ICD-10-CM | POA: Diagnosis not present

## 2017-12-23 DIAGNOSIS — Z7901 Long term (current) use of anticoagulants: Secondary | ICD-10-CM

## 2017-12-23 DIAGNOSIS — Z841 Family history of disorders of kidney and ureter: Secondary | ICD-10-CM

## 2017-12-23 DIAGNOSIS — J101 Influenza due to other identified influenza virus with other respiratory manifestations: Secondary | ICD-10-CM

## 2017-12-23 DIAGNOSIS — I130 Hypertensive heart and chronic kidney disease with heart failure and stage 1 through stage 4 chronic kidney disease, or unspecified chronic kidney disease: Secondary | ICD-10-CM | POA: Diagnosis not present

## 2017-12-23 DIAGNOSIS — R079 Chest pain, unspecified: Secondary | ICD-10-CM | POA: Diagnosis present

## 2017-12-23 HISTORY — DX: Non-ST elevation (NSTEMI) myocardial infarction: I21.4

## 2017-12-23 LAB — MRSA PCR SCREENING: MRSA BY PCR: NEGATIVE

## 2017-12-23 LAB — GLUCOSE, CAPILLARY
GLUCOSE-CAPILLARY: 231 mg/dL — AB (ref 65–99)
Glucose-Capillary: 103 mg/dL — ABNORMAL HIGH (ref 65–99)
Glucose-Capillary: 272 mg/dL — ABNORMAL HIGH (ref 65–99)

## 2017-12-23 LAB — BASIC METABOLIC PANEL
ANION GAP: 11 (ref 5–15)
ANION GAP: 10 (ref 5–15)
ANION GAP: 10 (ref 5–15)
Anion gap: 16 — ABNORMAL HIGH (ref 5–15)
BUN: 11 mg/dL (ref 6–20)
BUN: 10 mg/dL (ref 6–20)
BUN: 9 mg/dL (ref 6–20)
BUN: 7 mg/dL (ref 6–20)
CALCIUM: 9.2 mg/dL (ref 8.9–10.3)
CALCIUM: 8.7 mg/dL — AB (ref 8.9–10.3)
CALCIUM: 8.6 mg/dL — AB (ref 8.9–10.3)
CALCIUM: 8.6 mg/dL — AB (ref 8.9–10.3)
CHLORIDE: 99 mmol/L — AB (ref 101–111)
CO2: 18 mmol/L — AB (ref 22–32)
CO2: 19 mmol/L — AB (ref 22–32)
CO2: 19 mmol/L — ABNORMAL LOW (ref 22–32)
CO2: 18 mmol/L — ABNORMAL LOW (ref 22–32)
CREATININE: 1.55 mg/dL — AB (ref 0.44–1.00)
Chloride: 106 mmol/L (ref 101–111)
Chloride: 108 mmol/L (ref 101–111)
Chloride: 106 mmol/L (ref 101–111)
Creatinine, Ser: 1.38 mg/dL — ABNORMAL HIGH (ref 0.44–1.00)
Creatinine, Ser: 1.18 mg/dL — ABNORMAL HIGH (ref 0.44–1.00)
Creatinine, Ser: 1.16 mg/dL — ABNORMAL HIGH (ref 0.44–1.00)
GFR calc Af Amer: 41 mL/min — ABNORMAL LOW (ref >60)
GFR calc non Af Amer: 35 mL/min — ABNORMAL LOW (ref >60)
GFR calc non Af Amer: 49 mL/min — ABNORMAL LOW (ref >60)
GFR calc non Af Amer: 50 mL/min — ABNORMAL LOW (ref >60)
GFR, EST AFRICAN AMERICAN: 47 mL/min — AB (ref >60)
GFR, EST AFRICAN AMERICAN: 56 mL/min — AB (ref >60)
GFR, EST AFRICAN AMERICAN: 58 mL/min — AB (ref >60)
GFR, EST NON AFRICAN AMERICAN: 40 mL/min — AB (ref >60)
GLUCOSE: 401 mg/dL — AB (ref 65–99)
Glucose, Bld: 312 mg/dL — ABNORMAL HIGH (ref 65–99)
Glucose, Bld: 139 mg/dL — ABNORMAL HIGH (ref 65–99)
Glucose, Bld: 260 mg/dL — ABNORMAL HIGH (ref 65–99)
Potassium: 3.7 mmol/L (ref 3.5–5.1)
Potassium: 3.8 mmol/L (ref 3.5–5.1)
Potassium: 3.8 mmol/L (ref 3.5–5.1)
Potassium: 4.1 mmol/L (ref 3.5–5.1)
SODIUM: 137 mmol/L (ref 135–145)
Sodium: 133 mmol/L — ABNORMAL LOW (ref 135–145)
Sodium: 136 mmol/L (ref 135–145)
Sodium: 134 mmol/L — ABNORMAL LOW (ref 135–145)

## 2017-12-23 LAB — CBC
HCT: 36.1 % (ref 36.0–46.0)
HEMOGLOBIN: 11.7 g/dL — AB (ref 12.0–15.0)
MCH: 22.2 pg — AB (ref 26.0–34.0)
MCHC: 32.4 g/dL (ref 30.0–36.0)
MCV: 68.5 fL — ABNORMAL LOW (ref 78.0–100.0)
PLATELETS: ADEQUATE 10*3/uL (ref 150–400)
RBC: 5.27 MIL/uL — AB (ref 3.87–5.11)
RDW: 14.9 % (ref 11.5–15.5)
WBC: 9.5 10*3/uL (ref 4.0–10.5)

## 2017-12-23 LAB — URINALYSIS, ROUTINE W REFLEX MICROSCOPIC
BACTERIA UA: NONE SEEN
BILIRUBIN URINE: NEGATIVE
Glucose, UA: >=500 mg/dL — AB
KETONES UR: 5 mg/dL — AB
LEUKOCYTES UA: NEGATIVE
Nitrite: NEGATIVE
PH: 6 (ref 5.0–8.0)
PROTEIN: 100 mg/dL — AB
Specific Gravity, Urine: 1.01 (ref 1.005–1.030)

## 2017-12-23 LAB — CBG MONITORING, ED: Glucose-Capillary: 404 mg/dL — ABNORMAL HIGH (ref 65–99)

## 2017-12-23 LAB — HEMOGLOBIN A1C
Hgb A1c MFr Bld: 13 % — ABNORMAL HIGH (ref 4.8–5.6)
Mean Plasma Glucose: 326.4 mg/dL

## 2017-12-23 LAB — D-DIMER, QUANTITATIVE (NOT AT ARMC): D DIMER QUANT: 1.03 ug{FEU}/mL — AB (ref 0.00–0.50)

## 2017-12-23 LAB — I-STAT TROPONIN, ED: TROPONIN I, POC: 0 ng/mL (ref 0.00–0.08)

## 2017-12-23 LAB — PROTIME-INR
INR: 1.23
PROTHROMBIN TIME: 15.4 s — AB (ref 11.4–15.2)

## 2017-12-23 LAB — POC OCCULT BLOOD, ED: Fecal Occult Bld: NEGATIVE

## 2017-12-23 LAB — TROPONIN I
Troponin I: 0.03 ng/mL (ref <0.03)
Troponin I: <0.03 ng/mL (ref <0.03)

## 2017-12-23 LAB — TYPE AND SCREEN
ABO/RH(D): A NEG
ANTIBODY SCREEN: NEGATIVE

## 2017-12-23 LAB — MAGNESIUM: MAGNESIUM: 1.5 mg/dL — AB (ref 1.7–2.4)

## 2017-12-23 MED ORDER — PANTOPRAZOLE SODIUM 40 MG PO TBEC
40.0000 mg | DELAYED_RELEASE_TABLET | Freq: Two times a day (BID) | ORAL | Status: DC
  Administered 2017-12-23 – 2017-12-24 (×3): 40 mg via ORAL
  Filled 2017-12-23 (×3): qty 1

## 2017-12-23 MED ORDER — SODIUM CHLORIDE 0.9% FLUSH: 3.0000 mL | Freq: Two times a day (BID) | INTRAVENOUS | Status: DC

## 2017-12-23 MED ORDER — POTASSIUM CHLORIDE 10 MEQ/100ML IV SOLN
10.0000 meq | INTRAVENOUS | Status: AC
  Administered 2017-12-23 (×3): 10 meq via INTRAVENOUS
  Filled 2017-12-23 (×2): qty 100

## 2017-12-23 MED ORDER — MAGNESIUM SULFATE 2 GM/50ML IV SOLN
2.0000 g | Freq: Once | INTRAVENOUS | Status: AC
  Administered 2017-12-23: 2 g via INTRAVENOUS
  Filled 2017-12-23: qty 50

## 2017-12-23 MED ORDER — SODIUM CHLORIDE 0.9 % IV BOLUS (SEPSIS)
2000.0000 mL | Freq: Once | INTRAVENOUS | Status: AC
  Administered 2017-12-23: 2000 mL via INTRAVENOUS

## 2017-12-23 MED ORDER — INSULIN ASPART 100 UNIT/ML ~~LOC~~ SOLN
15.0000 [IU] | Freq: Once | SUBCUTANEOUS | Status: AC
  Administered 2017-12-23: 15 [IU] via SUBCUTANEOUS
  Filled 2017-12-23: qty 1

## 2017-12-23 MED ORDER — ALPRAZOLAM 0.5 MG PO TABS: 0.5000 mg | ORAL_TABLET | Freq: Two times a day (BID) | ORAL | Status: DC | PRN

## 2017-12-23 MED ORDER — AZITHROMYCIN 250 MG PO TABS
500.0000 mg | ORAL_TABLET | Freq: Once | ORAL | Status: AC
  Administered 2017-12-23: 500 mg via ORAL
  Filled 2017-12-23: qty 2

## 2017-12-23 MED ORDER — AZITHROMYCIN 250 MG PO TABS
250.0000 mg | ORAL_TABLET | Freq: Every day | ORAL | Status: DC
  Administered 2017-12-24: 250 mg via ORAL
  Filled 2017-12-23: qty 1

## 2017-12-23 MED ORDER — CLONIDINE HCL 0.2 MG PO TABS
0.2000 mg | ORAL_TABLET | Freq: Three times a day (TID) | ORAL | Status: DC
  Administered 2017-12-23 – 2017-12-24 (×4): 0.2 mg via ORAL
  Filled 2017-12-23 (×4): qty 1

## 2017-12-23 MED ORDER — ACETAMINOPHEN 325 MG PO TABS: 650.0000 mg | ORAL_TABLET | Freq: Four times a day (QID) | ORAL | Status: DC | PRN

## 2017-12-23 MED ORDER — INSULIN ASPART 100 UNIT/ML ~~LOC~~ SOLN
0.0000 [IU] | Freq: Three times a day (TID) | SUBCUTANEOUS | Status: DC
  Administered 2017-12-23 – 2017-12-24 (×2): 5 [IU] via SUBCUTANEOUS
  Administered 2017-12-24: 3 [IU] via SUBCUTANEOUS

## 2017-12-23 MED ORDER — FERROUS SULFATE 325 (65 FE) MG PO TABS
325.0000 mg | ORAL_TABLET | Freq: Two times a day (BID) | ORAL | Status: DC
  Administered 2017-12-23 – 2017-12-24 (×2): 325 mg via ORAL
  Filled 2017-12-23 (×2): qty 1

## 2017-12-23 MED ORDER — SODIUM CHLORIDE 0.9 % IV SOLN
INTRAVENOUS | Status: DC
  Administered 2017-12-23: 3.4 [IU]/h via INTRAVENOUS
  Filled 2017-12-23 (×2): qty 1

## 2017-12-23 MED ORDER — OXYCODONE-ACETAMINOPHEN 5-325 MG PO TABS
2.0000 | ORAL_TABLET | Freq: Three times a day (TID) | ORAL | Status: DC | PRN
  Administered 2017-12-23 – 2017-12-24 (×3): 2 via ORAL
  Filled 2017-12-23 (×3): qty 2

## 2017-12-23 MED ORDER — CEFTRIAXONE SODIUM 1 G IJ SOLR
1.0000 g | INTRAMUSCULAR | Status: DC
  Administered 2017-12-23: 1 g via INTRAVENOUS
  Filled 2017-12-23: qty 10

## 2017-12-23 MED ORDER — SODIUM CHLORIDE 0.9% FLUSH
3.0000 mL | Freq: Two times a day (BID) | INTRAVENOUS | Status: DC
  Administered 2017-12-23 – 2017-12-24 (×3): 3 mL via INTRAVENOUS

## 2017-12-23 MED ORDER — AMITRIPTYLINE HCL 25 MG PO TABS
50.0000 mg | ORAL_TABLET | Freq: Every day | ORAL | Status: DC
  Administered 2017-12-23: 50 mg via ORAL
  Filled 2017-12-23: qty 2

## 2017-12-23 MED ORDER — INSULIN GLARGINE 100 UNIT/ML ~~LOC~~ SOLN
15.0000 [IU] | Freq: Two times a day (BID) | SUBCUTANEOUS | Status: DC
  Administered 2017-12-23 (×2): 15 [IU] via SUBCUTANEOUS
  Filled 2017-12-23 (×3): qty 0.15

## 2017-12-23 MED ORDER — NICOTINE 7 MG/24HR TD PT24
7.0000 mg | MEDICATED_PATCH | Freq: Every day | TRANSDERMAL | Status: DC
  Administered 2017-12-23 – 2017-12-24 (×2): 7 mg via TRANSDERMAL
  Filled 2017-12-23 (×3): qty 1

## 2017-12-23 MED ORDER — ALPRAZOLAM 0.25 MG PO TABS: 0.2500 mg | ORAL_TABLET | Freq: Two times a day (BID) | ORAL | Status: DC | PRN

## 2017-12-23 MED ORDER — SODIUM CHLORIDE 0.9 % IV SOLN
INTRAVENOUS | Status: DC
  Filled 2017-12-23: qty 1

## 2017-12-23 MED ORDER — DICLOFENAC SODIUM 1 % TD GEL
2.0000 g | Freq: Four times a day (QID) | TRANSDERMAL | Status: DC
  Filled 2017-12-23 (×2): qty 100

## 2017-12-23 MED ORDER — SODIUM CHLORIDE 0.9 % IV SOLN: 250.0000 mL | INTRAVENOUS | Status: DC | PRN

## 2017-12-23 MED ORDER — GABAPENTIN 600 MG PO TABS
300.0000 mg | ORAL_TABLET | Freq: Three times a day (TID) | ORAL | Status: DC
  Administered 2017-12-23 – 2017-12-24 (×4): 300 mg via ORAL
  Filled 2017-12-23 (×4): qty 1

## 2017-12-23 MED ORDER — POTASSIUM CHLORIDE 10 MEQ/100ML IV SOLN
10.0000 meq | INTRAVENOUS | Status: AC
  Filled 2017-12-23: qty 100

## 2017-12-23 MED ORDER — DEXTROSE-NACL 5-0.45 % IV SOLN: INTRAVENOUS | Status: DC

## 2017-12-23 MED ORDER — ROSUVASTATIN CALCIUM 20 MG PO TABS
40.0000 mg | ORAL_TABLET | Freq: Every day | ORAL | Status: DC
  Administered 2017-12-23: 40 mg via ORAL
  Filled 2017-12-23: qty 2

## 2017-12-23 MED ORDER — NITROGLYCERIN 0.4 MG SL SUBL
0.4000 mg | SUBLINGUAL_TABLET | SUBLINGUAL | Status: DC | PRN
  Administered 2017-12-23 (×2): 0.4 mg via SUBLINGUAL
  Filled 2017-12-23: qty 1

## 2017-12-23 MED ORDER — SODIUM CHLORIDE 0.9 % IV SOLN
INTRAVENOUS | Status: DC
  Administered 2017-12-23: 11:00:00 via INTRAVENOUS

## 2017-12-23 MED ORDER — POTASSIUM CHLORIDE CRYS ER 20 MEQ PO TBCR
40.0000 meq | EXTENDED_RELEASE_TABLET | Freq: Once | ORAL | Status: AC
  Administered 2017-12-23: 40 meq via ORAL
  Filled 2017-12-23: qty 2

## 2017-12-23 MED ORDER — CLOPIDOGREL BISULFATE 75 MG PO TABS
75.0000 mg | ORAL_TABLET | Freq: Every day | ORAL | Status: DC
  Administered 2017-12-23 – 2017-12-24 (×2): 75 mg via ORAL
  Filled 2017-12-23 (×2): qty 1

## 2017-12-23 MED ORDER — RIVAROXABAN 20 MG PO TABS
20.0000 mg | ORAL_TABLET | Freq: Every day | ORAL | Status: DC
  Administered 2017-12-23: 20 mg via ORAL
  Filled 2017-12-23: qty 1

## 2017-12-23 MED ORDER — SODIUM CHLORIDE 0.9% FLUSH: 3.0000 mL | INTRAVENOUS | Status: DC | PRN

## 2017-12-23 MED ORDER — IOPAMIDOL (ISOVUE-370) INJECTION 76%
150.0000 mL | Freq: Once | INTRAVENOUS | Status: AC | PRN
  Administered 2017-12-23: 75 mL via INTRAVENOUS

## 2017-12-23 NOTE — Progress Notes (Signed)
Inpatient Diabetes Program Recommendations  AACE/ADA: New Consensus Statement on Inpatient Glycemic Control (2015)  Target Ranges:  Prepandial:   less than 140 mg/dL      Peak postprandial:   less than 180 mg/dL (1-2 hours)      Critically ill patients:  140 - 180 mg/dL   Lab Results  Component Value Date   GLUCAP 231 (H) 12/23/2017   HGBA1C 13.0 (H) 12/23/2017    Review of Glycemic ControlResults for Alejandra Hernandez, Alejandra Hernandez (MRN [id number]) as of 12/23/2017 15:54  Ref. Range 12/23/2017 08:53 12/23/2017 10:59  Glucose-Capillary Latest Ref Range: 65 - 99 mg/dL 404 (H) 231 (H)    Diabetes history: Type 2 DM Outpatient Diabetes medications: Humalog 2-5 units bid as needed, Lantus 40 units bid Current orders for Inpatient glycemic control:  Novolog moderate tid with meals  Lantus 15 units bid  Inpatient Diabetes Program Recommendations:    Spoke with patient regarding home DM management and A1C.  Also discussed insulin administration.  Patient states that "she is going to have to stop giving insulin in stomach b/c it is tore up".  Assessment of abdomen reveals potential "scar tissue" possibly from not rotating insulin injections.  We discussed the importance of rotating sites and that insulin will not absorb with scar tissue.  Patient reports that blood sugars had improved and she was surprised that A1C was increased.  We discussed the effects of stress on elevated blood sugars and the importance of not only checking blood sugars fasting but also through out the day.  May need adjustments in home DM medications.  Will follow.   Thanks,  Adah Perl, RN, BC-ADM Inpatient Diabetes Coordinator Pager [phone] (8a-5p)

## 2017-12-23 NOTE — ED Notes (Signed)
Attempted report x1. 
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
## 2017-12-24 DIAGNOSIS — J101 Influenza due to other identified influenza virus with other respiratory manifestations: Secondary | ICD-10-CM

## 2017-12-24 DIAGNOSIS — J189 Pneumonia, unspecified organism: Secondary | ICD-10-CM | POA: Diagnosis not present

## 2017-12-24 DIAGNOSIS — Z794 Long term (current) use of insulin: Secondary | ICD-10-CM | POA: Diagnosis not present

## 2017-12-24 DIAGNOSIS — N183 Chronic kidney disease, stage 3 (moderate): Secondary | ICD-10-CM | POA: Diagnosis not present

## 2017-12-24 DIAGNOSIS — E1110 Type 2 diabetes mellitus with ketoacidosis without coma: Secondary | ICD-10-CM | POA: Diagnosis not present

## 2017-12-24 DIAGNOSIS — I2510 Atherosclerotic heart disease of native coronary artery without angina pectoris: Secondary | ICD-10-CM | POA: Diagnosis not present

## 2017-12-24 DIAGNOSIS — E1122 Type 2 diabetes mellitus with diabetic chronic kidney disease: Secondary | ICD-10-CM | POA: Diagnosis not present

## 2017-12-24 DIAGNOSIS — E1165 Type 2 diabetes mellitus with hyperglycemia: Secondary | ICD-10-CM | POA: Diagnosis not present

## 2017-12-24 LAB — TROPONIN I: Troponin I: 0.03 ng/mL (ref <0.03)

## 2017-12-24 LAB — BASIC METABOLIC PANEL
Anion gap: 10 (ref 5–15)
BUN: 6 mg/dL (ref 6–20)
CO2: 17 mmol/L — ABNORMAL LOW (ref 22–32)
CREATININE: 1.23 mg/dL — AB (ref 0.44–1.00)
Calcium: 8.7 mg/dL — ABNORMAL LOW (ref 8.9–10.3)
Chloride: 108 mmol/L (ref 101–111)
GFR calc Af Amer: 54 mL/min — ABNORMAL LOW (ref >60)
GFR, EST NON AFRICAN AMERICAN: 46 mL/min — AB (ref >60)
Glucose, Bld: 293 mg/dL — ABNORMAL HIGH (ref 65–99)
Potassium: 4.3 mmol/L (ref 3.5–5.1)
SODIUM: 135 mmol/L (ref 135–145)

## 2017-12-24 LAB — GLUCOSE, CAPILLARY
Glucose-Capillary: 246 mg/dL — ABNORMAL HIGH (ref 65–99)
Glucose-Capillary: 177 mg/dL — ABNORMAL HIGH (ref 65–99)

## 2017-12-24 MED ORDER — INSULIN GLARGINE 100 UNIT/ML ~~LOC~~ SOLN: 25.0000 [IU] | Freq: Two times a day (BID) | SUBCUTANEOUS | Status: DC

## 2017-12-24 MED ORDER — AZITHROMYCIN 250 MG PO TABS: ORAL_TABLET | ORAL | 0 refills | Status: DC

## 2017-12-24 MED ORDER — CEFDINIR 300 MG PO CAPS: 300.0000 mg | ORAL_CAPSULE | Freq: Two times a day (BID) | ORAL | 0 refills | Status: DC

## 2017-12-24 MED ORDER — CEFDINIR 125 MG/5ML PO SUSR
300.0000 mg | Freq: Two times a day (BID) | ORAL | Status: DC
  Administered 2017-12-24: 300 mg via ORAL
  Filled 2017-12-24: qty 15

## 2017-12-24 MED ORDER — INSULIN GLARGINE 100 UNIT/ML ~~LOC~~ SOLN
20.0000 [IU] | Freq: Two times a day (BID) | SUBCUTANEOUS | Status: DC
  Administered 2017-12-24: 20 [IU] via SUBCUTANEOUS
  Filled 2017-12-24: qty 0.2

## 2017-12-24 NOTE — Progress Notes (Signed)
Reviewed discharge paperwork with pt and explained the importance of completing the full dose of antibiotics as well as the DRs orders to hold blood pressure meds until 2/3. No further questions. Discharged to pt's home.

## 2017-12-24 NOTE — Progress Notes (Signed)
Internal Medicine Attending:   I saw and examined the patient. I reviewed the resident's note and I agree with the resident's findings and plan as documented in the resident's note.  Patient states that she feels better this morning but now has a sore throat.  Patient was initially admitted for worsening shortness of breath and dyspnea on exertion and was found to have mild DKA on admission.  Patient was treated with fluids and insulin and her anion gap resolved and blood sugars have improved.  She is tolerating oral intake.  For her dyspnea she had an x-ray with no new infiltrate and then had a CT chest to rule out a PE given an elevated d-dimer level and a history of blood clots with a modified Geneva score of 10.  CT did not reveal a PE but did show left upper lobe infiltrate concerning for an infectious process as well as bilateral hilar adenopathy and mediastinal adenopathy likely reactive in nature.  Patient was started on ceftriaxone and azithromycin.  She has no white count and no fevers and her shortness of breath has improved.  We will transition her to oral antibiotics and have her complete a course as an outpatient.  She will need repeat CT chest in 2-3 months to ensure her lymphadenopathy and left upper lobe infiltrate have resolved.  Encouraged her to follow-up with her pulmonologist (Dr. Lamonte Sakai) as an outpatient.

## 2017-12-24 NOTE — Discharge Instructions (Addendum)
Information on my medicine - XARELTO® (rivaroxaban) ° °This medication education was reviewed with me or my healthcare representative as part of my discharge preparation.  The pharmacist that spoke with me during my hospital stay was:  Dang, Thuy Dien, RPH ° °WHY WAS XARELTO® PRESCRIBED FOR YOU? °Xarelto® was prescribed to treat blood clots that may have been found in the veins of your legs (deep vein thrombosis) or in your lungs (pulmonary embolism) and to reduce the risk of them occurring again. ° °What do you need to know about Xarelto®? °The dose is one 20 mg tablet taken ONCE A DAY with your evening meal. ° °DO NOT stop taking Xarelto® without talking to the health care provider who prescribed the medication.  Refill your prescription for 20 mg tablets before you run out. ° °After discharge, you should have regular check-up appointments with your healthcare provider that is prescribing your Xarelto®.  In the future your dose may need to be changed if your kidney function changes by a significant amount. ° °What do you do if you miss a dose? °If you are taking Xarelto® TWICE DAILY and you miss a dose, take it as soon as you remember. You may take two 15 mg tablets (total 30 mg) at the same time then resume your regularly scheduled 15 mg twice daily the next day. ° °If you are taking Xarelto® ONCE DAILY and you miss a dose, take it as soon as you remember on the same day then continue your regularly scheduled once daily regimen the next day. Do not take two doses of Xarelto® at the same time.  ° °Important Safety Information °Xarelto® is a blood thinner medicine that can cause bleeding. You should call your healthcare provider right away if you experience any of the following: °? Bleeding from an injury or your nose that does not stop. °? Unusual colored urine (red or dark brown) or unusual colored stools (red or black). °? Unusual bruising for unknown reasons. °? A serious fall or if you hit your head (even if  there is no bleeding). ° °Some medicines may interact with Xarelto® and might increase your risk of bleeding while on Xarelto®. To help avoid this, consult your healthcare provider or pharmacist prior to using any new prescription or non-prescription medications, including herbals, vitamins, non-steroidal anti-inflammatory drugs (NSAIDs) and supplements. ° °This website has more information on Xarelto®: www.xarelto.com. °
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
## 2017-12-24 NOTE — Care Management Obs Status (Signed)
[place] NOTIFICATION   Patient Details  Name: Alejandra Hernandez MRN: [id number] Date of Birth: 1956/01/31   Medicare Observation Status Notification Given:  Yes    Bethena Roys, RN 12/24/2017, 12:04 PM

## 2017-12-24 NOTE — Progress Notes (Signed)
Subjective:  No acute events overnight. Patient states she is feeling better this morning but now complaining of sore throat that has been present for the past several days. No other complaints this morning. Discussed with patient CT findings and treatment for CAP. Advised her that she will need to follow up with her PCP and pulmonologist as she will need repeat imaging in 2-3 months. Team asked patient to schedule hospital follow up appointment with PCP within the next 1-2 weeks. Patient voiced understanding and is in agreement with plan. All questions answered.   Objective:  Vital signs in last 24 hours: Vitals:   12/23/17 1644 12/23/17 1922 12/23/17 2331 12/24/17 0509  BP: (!) 142/70 (!) 143/63 116/74 105/77  Pulse: 75 75 96 70  Resp: 15 19 14 20   Temp: 97.6 F (36.4 C) 97.6 F (36.4 C) 98 F (36.7 C) 97.6 F (36.4 C)  TempSrc: Oral Oral Oral Oral  SpO2: 97% 95% 94% 97%  Weight:    153 lb 3.2 oz (69.5 kg)   Physical Exam  Constitutional: She is oriented to person, place, and time. She appears well-developed and well-nourished. No distress.  Cardiovascular: Normal rate, regular rhythm and normal heart sounds. Exam reveals no gallop and no friction rub.  No murmur heard. Pulmonary/Chest: Effort normal and breath sounds normal. No respiratory distress. She has no wheezes. She has no rales.  Abdominal: Soft. Bowel sounds are normal. She exhibits no distension. There is no tenderness.  Musculoskeletal: Normal range of motion. She exhibits no edema.  Neurological: She is alert and oriented to person, place, and time.  No focal deficits   Skin: Skin is warm. She is not diaphoretic.    Assessment/Plan:  Principal Problem:   Atypical chest pain Active Problems:   Type 2 diabetes mellitus with complication, with long-term current use of insulin (HCC)   Carotid artery disease (HCC)   Essential hypertension   PAD (peripheral artery disease) (HCC)   Claudication (HCC)   History of  tobacco use   Hyperglycemia  # Hemoptysis with tachycardia and history of DVT on [facility] # CAP  Patient with complaints of 3 day history of worsening cough with bloody sputum production. Difficult to assess quantity of blood. Hgb 11.7, improved from previous with most recent 10.0 07/2017. FOBT in ED negative. She denies any other bleeding. She is currently on Plavix (PCI 04/2017) and Xarelto (history of unprovoked DVTs). She did have hematemesis 07/2017 and ASA was stopped at that time . She was noted to have severe esophagitis on EGD 05/2017. CXR negative. No evidence of LE edema and no pain. Modified Geneva score is 10 and D-dimer elevated, therefore CTA chest ordered to r/o PE. No PE seen, but LUL atelectasis present concerning for infectious process vs pneumonitis. Bilateral hilar adenopathy and mediastinal adenopathy also noted. She was started on treatment for CAP and advised to follow-up with PCP as she will need repeat imaging in 2-3 months. - Continue azithromycin x 4 days  - Rocephin --> cefdinir x 4 days  - Will need repeat imaging 2-3 months    # DKA, mild:  Presented with CBG 400s, K 3.7, bicarb 18, AG 16, and ketones in urine. Asymptomatic on presentation. Received 2L NS bolus followed by NS @ 125cc/hr, IV potassium, and SQ insulin with resolution.  Most recent A1c in our system from 05/2017 was 12.8.  - Increase Lantus 20--> 25U BID (on 40 BID at home) - SSI-M - CBG monitoring   # Atypical  Chest Pain with CAD s/p PCI 04/2017 Presented with 3 days of substernal, non-radiating, sharp/stabbing constant chest pain that is reproducible on exam. No changes in EKG and troponin negative x3. Recent Myoview 6 days ago was low risk.   - No further workup indicated, ow suspicion for cardiac etiology  - Continue home Protonix (resumed yesterday, patient not taking at home)  # AKI on CKD III: Cr 1.55 admission now s/p 2L NS bolus for DKA. Renal function now at baseline. Serum Cr 1.2 today.  - Continue  to monitor renal function.   # Hypertension: Currently normotensive. Will advise patient to resume antihypertensive medication in 2 days after discharge.  - Continue home clonidine - Holding home amlodipine, losartan, imdur, metoprolol   # HLD: - Conitnue home crestor  Dispo: Anticipated discharge in approximately today-1 day(s).   Welford Roche, MD 12/24/2017, 6:38 AM Pager: [phone]

## 2017-12-28 ENCOUNTER — Telehealth: Payer: Self-pay

## 2017-12-28 DIAGNOSIS — E785 Hyperlipidemia, unspecified: Secondary | ICD-10-CM

## 2017-12-28 MED ORDER — EZETIMIBE 10 MG PO TABS: 10.0000 mg | ORAL_TABLET | Freq: Every day | ORAL | 0 refills | Status: DC

## 2017-12-28 NOTE — Telephone Encounter (Signed)
Notes recorded by Teressa Senter, RN on 12/28/2017 at 4:33 PM EST Patient made aware of results. Patient instructed to START Zetia 10 mg once day. Patient in agreement with plan and scheduled for repeat labs, fasting lipids and ALT on 03/18/9. Patient verbalizes understanding and thankful for the call.    Notes recorded by Sueanne Margarita, MD on 12/12/2017 at 10:13 AM EST Creatinine elevated but not on diuretics - likely due to glucosuria due to poorly controlled DM with elevated BS. Alk phos also elevated. LDL still elevated - please add Zetai '10mg'$  daily and repeat FLP and ALT in 6 weeks

## 2017-12-29 ENCOUNTER — Ambulatory Visit: Payer: Medicare Other | Admitting: Physician Assistant

## 2018-01-12 ENCOUNTER — Other Ambulatory Visit: Payer: Self-pay | Admitting: Cardiovascular Disease

## 2018-01-12 ENCOUNTER — Ambulatory Visit (INDEPENDENT_AMBULATORY_CARE_PROVIDER_SITE_OTHER): Payer: Medicare Other | Admitting: Cardiovascular Disease

## 2018-01-12 ENCOUNTER — Encounter: Payer: Self-pay | Admitting: Cardiovascular Disease

## 2018-01-12 VITALS — BP 152/78 | HR 67 | Ht 63.0 in | Wt 153.0 lb

## 2018-01-12 DIAGNOSIS — Z720 Tobacco use: Secondary | ICD-10-CM

## 2018-01-12 DIAGNOSIS — E785 Hyperlipidemia, unspecified: Secondary | ICD-10-CM | POA: Diagnosis not present

## 2018-01-12 DIAGNOSIS — I739 Peripheral vascular disease, unspecified: Secondary | ICD-10-CM

## 2018-01-12 DIAGNOSIS — I779 Disorder of arteries and arterioles, unspecified: Secondary | ICD-10-CM

## 2018-01-12 DIAGNOSIS — Z01818 Encounter for other preprocedural examination: Secondary | ICD-10-CM | POA: Diagnosis not present

## 2018-01-12 NOTE — Patient Instructions (Signed)
   [facility] [address] [place] [address] Dept: [phone] Loc: [place]  01/12/2018  You are scheduled for a Peripheral Angiogram on Wednesday, February 27 with Dr. Kathlyn Sacramento.  1. Please arrive at the [facility] (Main Entrance A) at [facility]: [address] [place], [place] [address] at 6:30 AM for pre-hydration prior to your 10:30 procedure. Free valet parking service is available.   Special note: Every effort is made to have your procedure done on time. Please understand that emergencies sometimes delay scheduled procedures.  2. Diet: Do not eat or drink anything after midnight prior to your procedure except sips of water to take medications.  3. Labs: Please have your labs drawn today at the [facility] office.  4. Medication instructions in preparation for your procedure:  Take half the dose of the Lantus the night before the procedure and hold all diabetic medications the morning of the procedure.  Hold the Xarelto two days prior to the procedure.  On the morning of your procedure, take your Plavix/Clopidogrel and any morning medicines NOT listed above.  You may use sips of water.  5. Plan for one night stay--bring personal belongings. 6. Bring a current list of your medications and current insurance cards. 7. You MUST have a responsible person to drive you home. 8. Someone MUST be with you the first 24 hours after you arrive home or your discharge will be delayed. 9. Please wear clothes that are easy to get on and off and wear slip-on shoes.  Thank you for allowing [place] to care for you!   -- [facility]    Your physician has requested that you have a lower extremity arterial duplex by Monday the 25th prior to your procedure on the 27th. During this test, ultrasound is used to evaluate arterial blood flow in the  legs. Allow one hour for this exam. There are no restrictions or special instructions. This will take place at [place], [address].
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
## 2018-01-13 LAB — BASIC METABOLIC PANEL
BUN / CREAT RATIO: 13 (ref 12–28)
BUN: 20 mg/dL (ref 8–27)
CALCIUM: 8.7 mg/dL (ref 8.7–10.3)
CO2: 19 mmol/L — ABNORMAL LOW (ref 20–29)
Chloride: 98 mmol/L (ref 96–106)
Creatinine, Ser: 1.54 mg/dL — ABNORMAL HIGH (ref 0.57–1.00)
GFR, EST AFRICAN AMERICAN: 42 mL/min/{1.73_m2} — AB (ref >59)
GFR, EST NON AFRICAN AMERICAN: 36 mL/min/{1.73_m2} — AB (ref >59)
Glucose: 273 mg/dL — ABNORMAL HIGH (ref 65–99)
POTASSIUM: 4.5 mmol/L (ref 3.5–5.2)
SODIUM: 137 mmol/L (ref 134–144)

## 2018-01-13 LAB — CBC
HEMOGLOBIN: 10.1 g/dL — AB (ref 11.1–15.9)
Hematocrit: 34.4 % (ref 34.0–46.6)
MCH: 21.4 pg — AB (ref 26.6–33.0)
MCHC: 29.4 g/dL — AB (ref 31.5–35.7)
MCV: 73 fL — ABNORMAL LOW (ref 79–97)
Platelets: 439 10*3/uL — ABNORMAL HIGH (ref 150–379)
RBC: 4.71 x10E6/uL (ref 3.77–5.28)
RDW: 17.1 % — AB (ref 12.3–15.4)
WBC: 10 10*3/uL (ref 3.4–10.8)

## 2018-01-13 LAB — PROTIME-INR
INR: 1.2 (ref 0.8–1.2)
Prothrombin Time: 12.2 s — ABNORMAL HIGH (ref 9.1–12.0)

## 2018-01-14 ENCOUNTER — Other Ambulatory Visit: Payer: Self-pay | Admitting: Cardiovascular Disease

## 2018-01-14 ENCOUNTER — Ambulatory Visit (HOSPITAL_COMMUNITY)
Admission: RE | Admit: 2018-01-14 | Discharge: 2018-01-14 | Disposition: A | Discharge status: Home / Self Care | Payer: Medicare Other | Source: Ambulatory Visit | Attending: Cardiology | Admitting: Cardiology

## 2018-01-14 DIAGNOSIS — I739 Peripheral vascular disease, unspecified: Secondary | ICD-10-CM | POA: Diagnosis not present

## 2018-01-14 DIAGNOSIS — I708 Atherosclerosis of other arteries: Secondary | ICD-10-CM | POA: Diagnosis not present

## 2018-01-14 DIAGNOSIS — I70203 Unspecified atherosclerosis of native arteries of extremities, bilateral legs: Secondary | ICD-10-CM | POA: Insufficient documentation

## 2018-01-14 DIAGNOSIS — I771 Stricture of artery: Secondary | ICD-10-CM | POA: Insufficient documentation

## 2018-01-14 DIAGNOSIS — Z01818 Encounter for other preprocedural examination: Secondary | ICD-10-CM

## 2018-01-15 NOTE — Research (Signed)
Left message for patient to call research office about possible participating in the TRANSCEND study prior to her procedure with Dr. Fletcher Anon next wednesday 01/20/18. I mentioned I could email ICF to her.

## 2018-01-20 ENCOUNTER — Other Ambulatory Visit: Payer: Self-pay

## 2018-01-20 ENCOUNTER — Encounter (HOSPITAL_COMMUNITY): Payer: Self-pay | Admitting: Cardiovascular Disease

## 2018-01-20 ENCOUNTER — Telehealth: Payer: Self-pay | Admitting: *Deleted

## 2018-01-20 ENCOUNTER — Ambulatory Visit (HOSPITAL_COMMUNITY)
Admission: RE | Disposition: A | Discharge status: Home / Self Care | Payer: Self-pay | Source: Ambulatory Visit | Attending: Cardiovascular Disease

## 2018-01-20 ENCOUNTER — Ambulatory Visit (HOSPITAL_COMMUNITY)
Admission: RE | Admit: 2018-01-20 | Discharge: 2018-01-21 | Disposition: A | Discharge status: Home / Self Care | Payer: Medicare Other | Source: Ambulatory Visit | Attending: Cardiovascular Disease | Admitting: Cardiovascular Disease

## 2018-01-20 DIAGNOSIS — I70221 Atherosclerosis of native arteries of extremities with rest pain, right leg: Secondary | ICD-10-CM | POA: Diagnosis not present

## 2018-01-20 DIAGNOSIS — I739 Peripheral vascular disease, unspecified: Secondary | ICD-10-CM | POA: Diagnosis present

## 2018-01-20 DIAGNOSIS — Z8719 Personal history of other diseases of the digestive system: Secondary | ICD-10-CM | POA: Insufficient documentation

## 2018-01-20 DIAGNOSIS — M549 Dorsalgia, unspecified: Secondary | ICD-10-CM | POA: Insufficient documentation

## 2018-01-20 DIAGNOSIS — F17210 Nicotine dependence, cigarettes, uncomplicated: Secondary | ICD-10-CM | POA: Diagnosis not present

## 2018-01-20 DIAGNOSIS — E1122 Type 2 diabetes mellitus with diabetic chronic kidney disease: Secondary | ICD-10-CM | POA: Diagnosis not present

## 2018-01-20 DIAGNOSIS — Z7901 Long term (current) use of anticoagulants: Secondary | ICD-10-CM | POA: Insufficient documentation

## 2018-01-20 DIAGNOSIS — N183 Chronic kidney disease, stage 3 (moderate): Secondary | ICD-10-CM | POA: Insufficient documentation

## 2018-01-20 DIAGNOSIS — E1151 Type 2 diabetes mellitus with diabetic peripheral angiopathy without gangrene: Secondary | ICD-10-CM | POA: Insufficient documentation

## 2018-01-20 DIAGNOSIS — I129 Hypertensive chronic kidney disease with stage 1 through stage 4 chronic kidney disease, or unspecified chronic kidney disease: Secondary | ICD-10-CM | POA: Insufficient documentation

## 2018-01-20 DIAGNOSIS — K219 Gastro-esophageal reflux disease without esophagitis: Secondary | ICD-10-CM | POA: Insufficient documentation

## 2018-01-20 DIAGNOSIS — Z794 Long term (current) use of insulin: Secondary | ICD-10-CM | POA: Insufficient documentation

## 2018-01-20 DIAGNOSIS — Z955 Presence of coronary angioplasty implant and graft: Secondary | ICD-10-CM | POA: Diagnosis not present

## 2018-01-20 DIAGNOSIS — G8929 Other chronic pain: Secondary | ICD-10-CM | POA: Diagnosis not present

## 2018-01-20 DIAGNOSIS — M109 Gout, unspecified: Secondary | ICD-10-CM | POA: Insufficient documentation

## 2018-01-20 DIAGNOSIS — E1142 Type 2 diabetes mellitus with diabetic polyneuropathy: Secondary | ICD-10-CM | POA: Diagnosis not present

## 2018-01-20 DIAGNOSIS — F419 Anxiety disorder, unspecified: Secondary | ICD-10-CM | POA: Diagnosis not present

## 2018-01-20 DIAGNOSIS — E118 Type 2 diabetes mellitus with unspecified complications: Secondary | ICD-10-CM

## 2018-01-20 DIAGNOSIS — I252 Old myocardial infarction: Secondary | ICD-10-CM | POA: Diagnosis not present

## 2018-01-20 DIAGNOSIS — I2510 Atherosclerotic heart disease of native coronary artery without angina pectoris: Secondary | ICD-10-CM | POA: Diagnosis not present

## 2018-01-20 DIAGNOSIS — Z86718 Personal history of other venous thrombosis and embolism: Secondary | ICD-10-CM | POA: Insufficient documentation

## 2018-01-20 DIAGNOSIS — E785 Hyperlipidemia, unspecified: Secondary | ICD-10-CM | POA: Insufficient documentation

## 2018-01-20 DIAGNOSIS — Z7902 Long term (current) use of antithrombotics/antiplatelets: Secondary | ICD-10-CM | POA: Insufficient documentation

## 2018-01-20 DIAGNOSIS — I70211 Atherosclerosis of native arteries of extremities with intermittent claudication, right leg: Secondary | ICD-10-CM | POA: Diagnosis not present

## 2018-01-20 HISTORY — PX: LOWER EXTREMITY ANGIOGRAPHY: CATH118251

## 2018-01-20 HISTORY — PX: ABDOMINAL AORTOGRAM: CATH118222

## 2018-01-20 HISTORY — DX: Pneumonia, unspecified organism: J18.9

## 2018-01-20 HISTORY — PX: PERIPHERAL VASCULAR BALLOON ANGIOPLASTY: CATH118281

## 2018-01-20 LAB — GLUCOSE, CAPILLARY
GLUCOSE-CAPILLARY: 197 mg/dL — AB (ref 65–99)
GLUCOSE-CAPILLARY: 224 mg/dL — AB (ref 65–99)
Glucose-Capillary: 247 mg/dL — ABNORMAL HIGH (ref 65–99)
Glucose-Capillary: 139 mg/dL — ABNORMAL HIGH (ref 65–99)

## 2018-01-20 LAB — POCT ACTIVATED CLOTTING TIME
ACTIVATED CLOTTING TIME: 213 s
ACTIVATED CLOTTING TIME: 169 s
Activated Clotting Time: 241 s

## 2018-01-20 SURGERY — ABDOMINAL AORTOGRAM
Anesthesia: LOCAL | Laterality: Right

## 2018-01-20 MED ORDER — ATROPINE SULFATE 1 MG/ML IJ SOLN
0.5000 mg | Freq: Once | INTRAMUSCULAR | Status: AC
  Filled 2018-01-20: qty 1.25

## 2018-01-20 MED ORDER — SODIUM CHLORIDE 0.9 % IV SOLN
INTRAVENOUS | Status: DC
  Administered 2018-01-20: 07:00:00 via INTRAVENOUS

## 2018-01-20 MED ORDER — LIDOCAINE HCL 1 % IJ SOLN
INTRAMUSCULAR | Status: AC
  Filled 2018-01-20: qty 20

## 2018-01-20 MED ORDER — GABAPENTIN 600 MG PO TABS
300.0000 mg | ORAL_TABLET | Freq: Three times a day (TID) | ORAL | Status: DC
  Administered 2018-01-20 – 2018-01-21 (×3): 300 mg via ORAL
  Filled 2018-01-20 (×3): qty 1

## 2018-01-20 MED ORDER — ONDANSETRON HCL 4 MG/2ML IJ SOLN: 4.0000 mg | Freq: Four times a day (QID) | INTRAMUSCULAR | Status: DC | PRN

## 2018-01-20 MED ORDER — SODIUM CHLORIDE 0.9 % IV BOLUS (SEPSIS)
100.0000 mL | Freq: Once | INTRAVENOUS | Status: AC
  Administered 2018-01-20: 16:00:00 100 mL via INTRAVENOUS

## 2018-01-20 MED ORDER — SODIUM CHLORIDE 0.9% FLUSH: 3.0000 mL | Freq: Two times a day (BID) | INTRAVENOUS | Status: DC

## 2018-01-20 MED ORDER — NITROGLYCERIN 0.4 MG SL SUBL: 0.4000 mg | SUBLINGUAL_TABLET | SUBLINGUAL | Status: DC | PRN

## 2018-01-20 MED ORDER — SODIUM CHLORIDE 0.9% FLUSH: 3.0000 mL | INTRAVENOUS | Status: DC | PRN

## 2018-01-20 MED ORDER — SODIUM CHLORIDE 0.9 % IV SOLN: 250.0000 mL | INTRAVENOUS | Status: DC | PRN

## 2018-01-20 MED ORDER — FERROUS SULFATE 325 (65 FE) MG PO TABS
325.0000 mg | ORAL_TABLET | Freq: Two times a day (BID) | ORAL | Status: DC
  Administered 2018-01-20 – 2018-01-21 (×2): 325 mg via ORAL
  Filled 2018-01-20 (×2): qty 1

## 2018-01-20 MED ORDER — FENTANYL CITRATE (PF) 100 MCG/2ML IJ SOLN
INTRAMUSCULAR | Status: DC | PRN
  Administered 2018-01-20 (×2): 50 ug via INTRAVENOUS

## 2018-01-20 MED ORDER — HEPARIN SODIUM (PORCINE) 1000 UNIT/ML IJ SOLN
INTRAMUSCULAR | Status: AC
  Filled 2018-01-20: qty 1

## 2018-01-20 MED ORDER — SODIUM CHLORIDE 0.9 % WEIGHT BASED INFUSION: 1.0000 mL/kg/h | INTRAVENOUS | Status: AC

## 2018-01-20 MED ORDER — PANTOPRAZOLE SODIUM 40 MG PO TBEC
40.0000 mg | DELAYED_RELEASE_TABLET | Freq: Two times a day (BID) | ORAL | Status: DC
  Administered 2018-01-20 – 2018-01-21 (×3): 40 mg via ORAL
  Filled 2018-01-20 (×3): qty 1

## 2018-01-20 MED ORDER — CLOPIDOGREL BISULFATE 75 MG PO TABS
75.0000 mg | ORAL_TABLET | Freq: Every day | ORAL | Status: DC
  Administered 2018-01-21: 10:00:00 75 mg via ORAL
  Filled 2018-01-20: qty 1

## 2018-01-20 MED ORDER — ENSURE ENLIVE PO LIQD
237.0000 mL | Freq: Two times a day (BID) | ORAL | Status: DC
  Administered 2018-01-20 – 2018-01-21 (×2): 237 mL via ORAL
  Filled 2018-01-20 (×5): qty 237

## 2018-01-20 MED ORDER — INSULIN GLARGINE 100 UNIT/ML ~~LOC~~ SOLN
40.0000 [IU] | Freq: Two times a day (BID) | SUBCUTANEOUS | Status: DC
  Administered 2018-01-20 – 2018-01-21 (×3): 40 [IU] via SUBCUTANEOUS
  Filled 2018-01-20 (×3): qty 0.4

## 2018-01-20 MED ORDER — OXYCODONE HCL 5 MG PO TABS
5.0000 mg | ORAL_TABLET | Freq: Once | ORAL | Status: AC
  Administered 2018-01-20: 14:00:00 5 mg via ORAL
  Filled 2018-01-20: qty 1

## 2018-01-20 MED ORDER — INSULIN LISPRO 100 UNIT/ML (KWIKPEN)
5.0000 [IU] | PEN_INJECTOR | Freq: Two times a day (BID) | SUBCUTANEOUS | Status: DC | PRN
Start: 2018-01-20 — End: 2018-01-20

## 2018-01-20 MED ORDER — OXYCODONE-ACETAMINOPHEN 5-325 MG PO TABS
1.0000 | ORAL_TABLET | Freq: Once | ORAL | Status: AC
  Administered 2018-01-20: 14:00:00 1 via ORAL
  Filled 2018-01-20: qty 1

## 2018-01-20 MED ORDER — HEPARIN (PORCINE) IN NACL 2-0.9 UNIT/ML-% IJ SOLN
INTRAMUSCULAR | Status: AC | PRN
  Administered 2018-01-20 (×2): 500 mL

## 2018-01-20 MED ORDER — MIDAZOLAM HCL 2 MG/2ML IJ SOLN
INTRAMUSCULAR | Status: AC
  Filled 2018-01-20: qty 2

## 2018-01-20 MED ORDER — ACETAMINOPHEN 325 MG PO TABS: 650.0000 mg | ORAL_TABLET | ORAL | Status: DC | PRN

## 2018-01-20 MED ORDER — FENTANYL CITRATE (PF) 100 MCG/2ML IJ SOLN
INTRAMUSCULAR | Status: AC
  Filled 2018-01-20: qty 2

## 2018-01-20 MED ORDER — ASPIRIN 81 MG PO CHEW
CHEWABLE_TABLET | ORAL | Status: AC
  Administered 2018-01-20: 81 mg via ORAL
  Filled 2018-01-20: qty 1

## 2018-01-20 MED ORDER — OXYCODONE-ACETAMINOPHEN 5-325 MG PO TABS
2.0000 | ORAL_TABLET | Freq: Three times a day (TID) | ORAL | Status: AC | PRN
  Administered 2018-01-20 – 2018-01-21 (×2): 2 via ORAL
  Filled 2018-01-20 (×2): qty 2

## 2018-01-20 MED ORDER — EZETIMIBE 10 MG PO TABS
10.0000 mg | ORAL_TABLET | Freq: Every day | ORAL | Status: DC
  Administered 2018-01-21: 10:00:00 10 mg via ORAL
  Filled 2018-01-20: qty 1

## 2018-01-20 MED ORDER — ROSUVASTATIN CALCIUM 20 MG PO TABS
40.0000 mg | ORAL_TABLET | Freq: Every day | ORAL | Status: DC
  Administered 2018-01-20: 21:00:00 40 mg via ORAL
  Filled 2018-01-20: qty 2

## 2018-01-20 MED ORDER — NITROGLYCERIN 1 MG/10 ML FOR IR/CATH LAB
INTRA_ARTERIAL | Status: DC | PRN
  Administered 2018-01-20: 300 mL

## 2018-01-20 MED ORDER — INSULIN ASPART 100 UNIT/ML ~~LOC~~ SOLN
0.0000 [IU] | Freq: Three times a day (TID) | SUBCUTANEOUS | Status: DC
  Administered 2018-01-20: 17:00:00 3 [IU] via SUBCUTANEOUS

## 2018-01-20 MED ORDER — MIDAZOLAM HCL 2 MG/2ML IJ SOLN
INTRAMUSCULAR | Status: DC | PRN
  Administered 2018-01-20: 1 mg via INTRAVENOUS

## 2018-01-20 MED ORDER — AMITRIPTYLINE HCL 25 MG PO TABS
50.0000 mg | ORAL_TABLET | Freq: Every day | ORAL | Status: DC
  Administered 2018-01-20: 21:00:00 50 mg via ORAL
  Filled 2018-01-20: qty 2

## 2018-01-20 MED ORDER — ANGIOPLASTY BOOK
Freq: Once | Status: AC
  Administered 2018-01-20: 21:00:00 1
  Filled 2018-01-20: qty 1

## 2018-01-20 MED ORDER — HYDRALAZINE HCL 20 MG/ML IJ SOLN
5.0000 mg | INTRAMUSCULAR | Status: DC | PRN
  Administered 2018-01-20: 5 mg via INTRAVENOUS
  Filled 2018-01-20: qty 1

## 2018-01-20 MED ORDER — ASPIRIN 81 MG PO CHEW
81.0000 mg | CHEWABLE_TABLET | ORAL | Status: AC
  Administered 2018-01-20: 81 mg via ORAL

## 2018-01-20 MED ORDER — ALPRAZOLAM 0.5 MG PO TABS
0.5000 mg | ORAL_TABLET | Freq: Two times a day (BID) | ORAL | Status: DC | PRN
  Administered 2018-01-20: 0.5 mg via ORAL
  Filled 2018-01-20: qty 1

## 2018-01-20 MED ORDER — CLONIDINE HCL 0.1 MG PO TABS
0.2000 mg | ORAL_TABLET | Freq: Three times a day (TID) | ORAL | Status: DC
  Administered 2018-01-20 – 2018-01-21 (×3): 0.2 mg via ORAL
  Filled 2018-01-20 (×3): qty 2

## 2018-01-20 MED ORDER — HEPARIN SODIUM (PORCINE) 1000 UNIT/ML IJ SOLN
INTRAMUSCULAR | Status: DC | PRN
  Administered 2018-01-20: 5000 [IU] via INTRAVENOUS

## 2018-01-20 MED ORDER — LIDOCAINE HCL (PF) 1 % IJ SOLN
INTRAMUSCULAR | Status: DC | PRN
  Administered 2018-01-20: 12 mL

## 2018-01-20 MED ORDER — ATROPINE SULFATE 1 MG/10ML IJ SOSY
PREFILLED_SYRINGE | INTRAMUSCULAR | Status: AC
  Administered 2018-01-20: 0.5 mg
  Filled 2018-01-20: qty 10

## 2018-01-20 SURGICAL SUPPLY — 26 items
BALLN COYOTE ES OTW 4X20X143 (BALLOONS) ×3
BALLN IN.PACT DCB 5X40 (BALLOONS) ×6
BALLOON COYOTE ES OTW 4X20X143 (BALLOONS) ×2 IMPLANT
CATH OMNI FLUSH 5F 65CM (CATHETERS) ×3 IMPLANT
COVER PRB 48X5XTLSCP FOLD TPE (BAG) ×2 IMPLANT
COVER PROBE 5X48 (BAG) ×1
DCB IN.PACT 5X40 (BALLOONS) ×4 IMPLANT
DEVICE CONTINUOUS FLUSH (MISCELLANEOUS) ×3 IMPLANT
FILTER CO2 0.2 MICRON (VASCULAR PRODUCTS) ×3 IMPLANT
KIT ENCORE 26 ADVANTAGE (KITS) ×3 IMPLANT
KIT MICROPUNCTURE NIT STIFF (SHEATH) ×3 IMPLANT
KIT PV (KITS) ×3 IMPLANT
RESERVOIR CO2 (VASCULAR PRODUCTS) ×3 IMPLANT
SET FLUSH CO2 (MISCELLANEOUS) ×3 IMPLANT
SHEATH PINNACLE 5F 10CM (SHEATH) ×3 IMPLANT
SHEATH PINNACLE ST 6F 45CM (SHEATH) ×3 IMPLANT
SHIELD RADPAD SCOOP 12X17 (MISCELLANEOUS) ×3 IMPLANT
STOPCOCK MORSE 400PSI 3WAY (MISCELLANEOUS) ×3 IMPLANT
SYRINGE MEDRAD AVANTA MACH 7 (SYRINGE) ×3 IMPLANT
TAPE VIPERTRACK RADIOPAQ (MISCELLANEOUS) ×2 IMPLANT
TAPE VIPERTRACK RADIOPAQUE (MISCELLANEOUS) ×1
TRANSDUCER W/STOPCOCK (MISCELLANEOUS) ×3 IMPLANT
TRAY PV CATH (CUSTOM PROCEDURE TRAY) ×3 IMPLANT
TUBING CIL FLEX 10 FLL-RA (TUBING) ×3 IMPLANT
WIRE BENTSON .035X145CM (WIRE) ×3 IMPLANT
WIRE SPARTACORE .014X300CM (WIRE) ×3 IMPLANT

## 2018-01-20 NOTE — Interval H&P Note (Signed)
History and Physical Interval Note:  01/20/2018 10:34 AM  Alejandra Hernandez  has presented today for surgery, with the diagnosis of pad  The various methods of treatment have been discussed with the patient and family. After consideration of risks, benefits and other options for treatment, the patient has consented to  Procedure(s): ABDOMINAL AORTOGRAM W/LOWER EXTREMITY (N/A) as a surgical intervention .  The patient's history has been reviewed, patient examined, no change in status, stable for surgery.  I have reviewed the patient's chart and labs.  Questions were answered to the patient's satisfaction.     Kathlyn Sacramento

## 2018-01-20 NOTE — Progress Notes (Signed)
Site area: left groin  Site Prior to Removal:  Level 0  Pressure Applied For 20 MINUTES    Minutes Beginning at 1622  Manual:   Yes.    Patient Status During Pull:  AAOX 4  Post Pull Groin Site:  Level 0  Post Pull Instructions Given:  Yes.    Post Pull Pulses Present:  Yes.    Dressing Applied:  Yes.    Comments:  Short vagal reaction noted, C/o nausea during sheath pull, b/p dropped to 77/36 mmhg, HR 42, atropine .5 mg given 100 cc iv bolus given, with success. Vital signs monitored, HR ,b/p maintained WNL . Femoral site stable ,procedure completed with no further complications.

## 2018-01-20 NOTE — Telephone Encounter (Signed)
Orders placed for lower

## 2018-01-21 ENCOUNTER — Other Ambulatory Visit: Payer: Self-pay | Admitting: Physician Assistant

## 2018-01-21 ENCOUNTER — Telehealth: Payer: Self-pay | Admitting: Physician Assistant

## 2018-01-21 DIAGNOSIS — Z794 Long term (current) use of insulin: Secondary | ICD-10-CM

## 2018-01-21 DIAGNOSIS — E7800 Pure hypercholesterolemia, unspecified: Secondary | ICD-10-CM | POA: Diagnosis not present

## 2018-01-21 DIAGNOSIS — I2510 Atherosclerotic heart disease of native coronary artery without angina pectoris: Secondary | ICD-10-CM

## 2018-01-21 DIAGNOSIS — I739 Peripheral vascular disease, unspecified: Secondary | ICD-10-CM

## 2018-01-21 DIAGNOSIS — I2583 Coronary atherosclerosis due to lipid rich plaque: Secondary | ICD-10-CM

## 2018-01-21 DIAGNOSIS — E1100 Type 2 diabetes mellitus with hyperosmolarity without nonketotic hyperglycemic-hyperosmolar coma (NKHHC): Secondary | ICD-10-CM | POA: Diagnosis not present

## 2018-01-21 DIAGNOSIS — E1151 Type 2 diabetes mellitus with diabetic peripheral angiopathy without gangrene: Secondary | ICD-10-CM | POA: Diagnosis not present

## 2018-01-21 DIAGNOSIS — I10 Essential (primary) hypertension: Secondary | ICD-10-CM | POA: Diagnosis not present

## 2018-01-21 LAB — BASIC METABOLIC PANEL
Anion gap: 9 (ref 5–15)
BUN: 14 mg/dL (ref 6–20)
CHLORIDE: 109 mmol/L (ref 101–111)
CO2: 22 mmol/L (ref 22–32)
CREATININE: 1.35 mg/dL — AB (ref 0.44–1.00)
Calcium: 8.5 mg/dL — ABNORMAL LOW (ref 8.9–10.3)
GFR calc Af Amer: 48 mL/min — ABNORMAL LOW (ref >60)
GFR, EST NON AFRICAN AMERICAN: 41 mL/min — AB (ref >60)
Glucose, Bld: 130 mg/dL — ABNORMAL HIGH (ref 65–99)
POTASSIUM: 3.7 mmol/L (ref 3.5–5.1)
SODIUM: 140 mmol/L (ref 135–145)

## 2018-01-21 LAB — GLUCOSE, CAPILLARY
GLUCOSE-CAPILLARY: 149 mg/dL — AB (ref 65–99)
GLUCOSE-CAPILLARY: 65 mg/dL (ref 65–99)

## 2018-01-21 MED ORDER — ISOSORBIDE MONONITRATE ER 30 MG PO TB24: 90.0000 mg | ORAL_TABLET | Freq: Every day | ORAL | 11 refills | Status: DC

## 2018-01-21 MED FILL — Lidocaine HCl Local Inj 1%: INTRAMUSCULAR | Qty: 20 | Status: AC

## 2018-01-21 NOTE — Progress Notes (Addendum)
Progress Note  Patient Name: Alejandra Hernandez Date of Encounter: 01/21/2018  Primary Cardiologist: No primary care provider on file.   Subjective   Denies any complaints of leg pain chest pain or shortness of breath this morning  Inpatient Medications    Scheduled Meds: . amitriptyline  50 mg Oral QHS  . cloNIDine  0.2 mg Oral TID  . clopidogrel  75 mg Oral Daily  . ezetimibe  10 mg Oral Daily  . feeding supplement (ENSURE ENLIVE)  237 mL Oral BID BM  . ferrous sulfate  325 mg Oral BID WC  . gabapentin  300 mg Oral TID  . insulin aspart  0-9 Units Subcutaneous TID WC  . insulin glargine  40 Units Subcutaneous BID  . pantoprazole  40 mg Oral BID  . rosuvastatin  40 mg Oral QHS  . sodium chloride flush  3 mL Intravenous Q12H   Continuous Infusions: . sodium chloride     PRN Meds: sodium chloride, acetaminophen, ALPRAZolam, hydrALAZINE, nitroGLYCERIN, ondansetron (ZOFRAN) IV, sodium chloride flush   Vital Signs    Vitals:   01/20/18 1234 01/20/18 2000 01/21/18 0641 01/21/18 0700  BP: (!) 176/58 (!) 167/58 (!) 163/46 (!) 145/52  Pulse: (!) 48 77 90 83  Resp: 15 (!) 22 19 16   Temp: 97.6 F (36.4 C) (!) 97.5 F (36.4 C) 97.7 F (36.5 C) 98 F (36.7 C)  TempSrc: Oral Oral Oral Oral  SpO2: 97% 99% 99% 93%  Weight:   156 lb 1.4 oz (70.8 kg)   Height:        Intake/Output Summary (Last 24 hours) at 01/21/2018 0912 Last data filed at 01/21/2018 0700 Gross per 24 hour  Intake 1622 ml  Output 1900 ml  Net -278 ml   Filed Weights   01/20/18 0618 01/21/18 0641  Weight: 150 lb (68 kg) 156 lb 1.4 oz (70.8 kg)    Telemetry    Sinus bradycardia during sleep to NSR in the 70's- Personally Reviewed  ECG    No new EKG to review- Personally Reviewed  Physical Exam   GEN: No acute distress.   Neck: No JVD Cardiac: RRR, no murmurs, rubs, or gallops.  Respiratory: Clear to auscultation bilaterally. GI: Soft, nontender, non-distended  MS: No edema; No  deformity. Neuro:  Nonfocal  Psych: Normal affect   Labs    Chemistry Recent Labs  Lab 01/21/18 0232  NA 140  K 3.7  CL 109  CO2 22  GLUCOSE 130*  BUN 14  CREATININE 1.35*  CALCIUM 8.5*  GFRNONAA 41*  GFRAA 48*  ANIONGAP 9     HematologyNo results for input(s): WBC, RBC, HGB, HCT, MCV, MCH, MCHC, RDW, PLT in the last 168 hours.  Cardiac EnzymesNo results for input(s): TROPONINI in the last 168 hours. No results for input(s): TROPIPOC in the last 168 hours.   BNPNo results for input(s): BNP, PROBNP in the last 168 hours.   DDimer No results for input(s): DDIMER in the last 168 hours.   Radiology    No results found.  Cardiac Studies   12/2017 LE arteriogram Conclusion   1.  Stable 60% stenosis in the right common iliac artery. 2.  Severe disease affecting the right SFA in 2 different areas with moderate diffuse disease throughout the whole SFA.  One-vessel runoff below the knee via the anterior tibial artery which has moderate diffuse disease in the proximal and mid segment. 3.  Successful focal drug-coated balloon angioplasty to the mid and  distal right SFA.  Recommendations: Hydrate overnight given underlying chronic kidney disease.  Check basic metabolic profile in the morning.  A total of 60 mL of contrast was used. If no bleeding issues, can resume Xarelto tomorrow.  She is already on Plavix. Recommend a follow-up ABI and lower extremity arterial duplex to be done within 2 weeks.  The right SFA will need to be followed closely as this ultimately might require atherectomy of the whole vessel given residual diffuse disease.      Patient Profile     62 y.o. female with history of ASCAD, PVD, hyperlipidemia, HTN and tobacco use admitted for RLE angiogram and possible PTA.  Assessment & Plan    1.  Peripheral arterial disease: s/p successful atherectomy and drug-coated balloon angioplasty to the left SFA in 07/2016.  Progression of right calf claudication  with early rest pain affecting the foot.   -Angiogram this admission showed stable 60% right common iliac artery, severe disease affecting the right SFA with moderate diffuse disease throughout the whole SFA and one vessel runoff below the knee via the anterior tibial artery which has moderate diffuse disease in the proximal mid segment.  Status post focal drug-coated balloon angioplasty to the mid and distal right SFA. -She will continue on Plavix 75 mg daily.  She is not on aspirin due to concrement Xarelto use -Continue high-dose statin therapy -Continued aggressive risk factor modification -She will need follow-up ABI and lower extremity arterial duplex in 2 weeks  2. Bilateral carotid disease:  - Continue treatment of risk factors repeat Doppler an annual basis. - Continue statin  3. Hyperlipidemia: - Continue high-dose rosuvastatin with a target LDL of less than 70. - Her last LDL was 85 in January 2019 and she was started on Zetia 10 mg at that time - She has repeat lipids pending next week  4. Tobacco use: She reports that she quit smoking in January of this year.  5.  ASCAD -status post cath 05/2017 with patent mid RCA stent, occluded left circumflex supplying a small area distally and nonobstructive disease elsewhere on medical management with no angina. -She will continue on Plavix, high-dose statin.  She is not on aspirin due to Plavix and Xarelto. -restart lopressor 12.5mg  BID and Imdur 90mg  daily that she was taking at home  6.  HTN -BP borderline controlled on exam today.  Her losartan, Lopressor and amlodipine, Imdur were held on admission for procedure. -Creatinine is stable to re-resume her home losartan 25mg  daily, Imdur 90mg  daily and amlodipine 10mg  daily.   -Given bradycardia we will hold on Lopressor therapy this can be revisited as an outpatient.  7. Chronic kidney disease stage III  -Creatinine stable today at 1.35 status post angio  8.  History of bilateral  DVT  -Restart Xarelto  9.  Type 2 diabetes mellitus -HbA1C 13 at last admission -She needs close followup with PCP in regards to poor glucose control   For questions or updates, please contact [place] Please consult [email or web address] for contact info under Cardiology/STEMI.      Signed, Fransico Him, MD  01/21/2018, 9:12 AM

## 2018-01-21 NOTE — Telephone Encounter (Addendum)
   D/c summary incorrectly listed metoprolol to be stopped.  This was incorrect, she is to continue it.  Pt contacted by phone and told to restart the metoprolol at her previous dose of 12.5 mg bid.    Pt agrees to this.  Rosaria Ferries, PA-C 01/21/2018 2:15 PM [place] [phone]

## 2018-01-21 NOTE — Discharge Summary (Signed)
Discharge Summary    Patient ID: Alejandra Hernandez,  MRN: [id number], DOB/AGE: 1956-04-23 62 y.o.  Admit date: 01/20/2018 Discharge date: 01/21/2018  Primary Care Provider: Antonietta Jewel Primary Cardiologist: Dr Radford Pax Peripheral Vascular MD: Dr Fletcher Anon  Discharge Diagnoses    Active Problems:   PAD (peripheral artery disease) (HCC)  Allergies No Known Allergies  Diagnostic Studies/Procedures    Peripheral Vascular Cath:  1.  Stable 60% stenosis in the right common iliac artery. 2.  Severe disease affecting the right SFA in 2 different areas with moderate diffuse disease throughout the whole SFA.  One-vessel runoff below the knee via the anterior tibial artery which has moderate diffuse disease in the proximal and mid segment. 3.  Successful focal drug-coated balloon angioplasty to the mid and distal right SFA. Recommendations: Hydrate overnight given underlying chronic kidney disease.  Check basic metabolic profile in the morning.  A total of 60 mL of contrast was used. If no bleeding issues, can resume Xarelto tomorrow.  She is already on Plavix. Recommend a follow-up ABI and lower extremity arterial duplex to be done within 2 weeks.  The right SFA will need to be followed closely as this ultimately might require atherectomy of the whole vessel given residual diffuse disease. _____________   History of Present Illness     Alejandra Hernandez is a 62 y.o. female has known history of hypertension, hyperlipidemia, diabetes mellitus, coronary artery disease, GI bleed, carotid disease, GERD and tobacco use. She was seen in the office for PAD and came to the hospital for a PV cath on 01/20/2018.  Hospital Course     Consultants: None   She had the PV procedure, results above. She tolerated it well. She was hydrated overnight.  On 02/28, she was seen by Dr Radford Pax and all data were reviewed.   Her BP meds were not accurate after her recent hospitalization, and were updated.  She is to  resume the Imdur 90 mg, amlodipine 10 mg and losartan 25 mg but hold on the beta-blocker.  She had some bradycardia while inpatient, decide on resuming beta-blocker as an outpatient.  Her last A1c was 13, f/u with PCP in 1 week.  No further inpatient workup is indicated and she is considered stable for discharge to follow up as and outpatient.   F/u appt made with Dr Fletcher Anon.  _____________  Discharge Vitals Blood pressure (!) 145/52, pulse 83, temperature 98 F (36.7 C), temperature source Oral, resp. rate 16, height 5\' 3"  (1.6 m), weight 156 lb 1.4 oz (70.8 kg), SpO2 93 %.  Filed Weights   01/20/18 0618 01/21/18 0641  Weight: 150 lb (68 kg) 156 lb 1.4 oz (70.8 kg)    Labs & Radiologic Studies    Basic Metabolic Panel Recent Labs    01/21/18 0232  NA 140  K 3.7  CL 109  CO2 22  GLUCOSE 130*  BUN 14  CREATININE 1.35*  CALCIUM 8.5*   _____________   Disposition   Pt is being discharged home today in good condition.  Follow-up Plans & Appointments    Follow-up Information    Wellington Hampshire, MD Follow up on 02/09/2018.   Specialty:  Cardiology Why:  Please arrive at 8:20 am for an 8:40 am appt. Contact information: [address] [place] [address] [phone]        Antonietta Jewel, MD. Schedule an appointment as soon as possible for a visit in 1 week(s).   Specialty:  Internal Medicine Contact information: [address] [place] [address] [phone]        [facility] Follow up on 01/27/2018.   Specialty:  Cardiology Why:  Please arrive at 12:15 pm for a 12:30 pm appt.  Contact information: [place] [phone]         Discharge Instructions    Diet - low sodium heart healthy   Complete by:  As directed    Diet - low sodium heart healthy   Complete by:  As directed    Diet Carb Modified   Complete by:  As directed    Increase activity slowly    Complete by:  As directed    Increase activity slowly   Complete by:  As directed       Discharge Medications   Allergies as of 01/21/2018   No Known Allergies     Medication List    STOP taking these medications   metoprolol tartrate 25 MG tablet Commonly known as:  LOPRESSOR     TAKE these medications   ALPRAZolam 0.5 MG tablet Commonly known as:  XANAX Take 0.5 mg by mouth 2 (two) times daily as needed for anxiety.   amitriptyline 25 MG tablet Commonly known as:  ELAVIL Take 50 mg by mouth at bedtime.   amLODipine 10 MG tablet Commonly known as:  NORVASC Take 10 mg by mouth at bedtime.   cloNIDine 0.2 MG tablet Commonly known as:  CATAPRES TAKE 1 TABLET(0.2 MG) BY MOUTH THREE TIMES DAILY   clopidogrel 75 MG tablet Commonly known as:  PLAVIX Take 1 tablet (75 mg total) by mouth daily.   ezetimibe 10 MG tablet Commonly known as:  ZETIA Take 1 tablet (10 mg total) by mouth daily.   ferrous sulfate 325 (65 FE) MG tablet Take 325 mg by mouth 2 (two) times daily with a meal.   furosemide 20 MG tablet Commonly known as:  LASIX Take 20 mg by mouth daily.   gabapentin 600 MG tablet Commonly known as:  NEURONTIN Take 0.5 tablets (300 mg total) by mouth 3 (three) times daily.   HUMALOG KWIKPEN 100 UNIT/ML KiwkPen Generic drug:  insulin lispro Inject 5-12 Units into the skin 2 (two) times daily as needed (for high blood sugar). Per sliding scale   isosorbide mononitrate 30 MG 24 hr tablet Commonly known as:  IMDUR Take 3 tablets (90 mg total) by mouth daily. What changed:    how much to take  when to take this   LANTUS 100 UNIT/ML injection Generic drug:  insulin glargine Inject 40 Units into the skin 2 (two) times daily.   losartan 25 MG tablet Commonly known as:  COZAAR Take 25 mg by mouth daily.   nitroGLYCERIN 0.4 MG SL tablet Commonly known as:  NITROSTAT Place 1 tablet (0.4 mg total) under the tongue every 5 (five) minutes as needed for chest  pain.   ONE TOUCH ULTRA TEST test strip Generic drug:  glucose blood 1 each 3 (three) times daily as needed.   ONETOUCH DELICA LANCETS FINE Misc 1 each 3 (three) times daily as needed.   oxyCODONE-acetaminophen 10-325 MG tablet Commonly known as:  PERCOCET Take 1 tablet by mouth every 8 (eight) hours as needed for pain.   pantoprazole 40 MG tablet Commonly known as:  PROTONIX TAKE 1 TABLET BY MOUTH TWICE DAILY BEFORE A MEAL   rivaroxaban 20 MG Tabs tablet Commonly known as:  XARELTO Take 1 tablet (20 mg total) by mouth daily with supper.   rosuvastatin 40 MG tablet Commonly known as:  CRESTOR Take 40 mg by mouth at bedtime.        Outstanding Labs/Studies   None  Duration of Discharge Encounter   Greater than 30 minutes including physician time.  Alcario Drought Shakari Qazi NP 01/21/2018, 10:18 AM

## 2018-01-21 NOTE — Discharge Instructions (Signed)
PLEASE REMEMBER TO BRING ALL OF YOUR MEDICATIONS TO EACH OF YOUR FOLLOW-UP OFFICE VISITS. ° °PLEASE ATTEND ALL SCHEDULED FOLLOW-UP APPOINTMENTS.  ° °Activity: Increase activity slowly as tolerated. You may shower, but no soaking baths (or swimming) for 1 week. No driving for 2 days. No lifting over 5 lbs for 1 week. No sexual activity for 1 week.  ° °You May Return to Work: in 1 week (if applicable) ° °Wound Care: You may wash cath site gently with soap and water. Keep cath site clean and dry. If you notice pain, swelling, bleeding or pus at your cath site, please call [phone]. ° ° ° °Peripheral Vascular Cath Site Care °Refer to this sheet in the next few weeks. These instructions provide you with information on caring for yourself after your procedure. Your caregiver may also give you more specific instructions. Your treatment has been planned according to current medical practices, but problems sometimes occur. Call your caregiver if you have any problems or questions after your procedure. °HOME CARE INSTRUCTIONS °You may shower 24 hours after the procedure. Remove the bandage (dressing) and gently wash the site with plain soap and water. Gently pat the site dry.  °Do not apply powder or lotion to the site.  °Do not sit in a bathtub, swimming pool, or whirlpool for 5 to 7 days.  °No bending, squatting, or lifting anything over 10 pounds (4.5 kg) as directed by your caregiver.  °Inspect the site at least twice daily.  °Do not drive home if you are discharged the same day of the procedure. Have someone else drive you.  °You may drive 24 hours after the procedure unless otherwise instructed by your caregiver.  °What to expect: °Any bruising will usually fade within 1 to 2 weeks.  °Blood that collects in the tissue (hematoma) may be painful to the touch. It should usually decrease in size and tenderness within 1 to 2 weeks.  °SEEK IMMEDIATE MEDICAL CARE IF: °You have unusual pain at the site or down the affected  limb.  °You have redness, warmth, swelling, or pain at the site.  °You have drainage (other than a small amount of blood on the dressing).  °You have chills.  °You have a fever or persistent symptoms for more than 72 hours.  °You have a fever and your symptoms suddenly get worse.  °Your leg becomes pale, cool, tingly, or numb.  °You have heavy bleeding from the site. Hold pressure on the site.  °Document Released: 12/13/2010 Document Revised: 10/30/2011 Document Reviewed: 12/13/2010 °ExitCare® Patient Information ©2012 [employer]. ° °

## 2018-01-21 NOTE — Progress Notes (Signed)
Hypoglycemic Event  CBG: 65 (06:40am)  Treatment: 15 GM carbohydrate snack  Symptoms: None  Follow-up CBG: Time:7:15 CBG Result:149  Possible Reasons for Event: Medication Regimen

## 2018-01-27 ENCOUNTER — Inpatient Hospital Stay (HOSPITAL_COMMUNITY): Admission: RE | Admit: 2018-01-27 | Payer: Medicare Other | Source: Ambulatory Visit

## 2018-01-27 ENCOUNTER — Encounter: Payer: Self-pay | Admitting: Emergency Medicine

## 2018-01-27 ENCOUNTER — Ambulatory Visit (INDEPENDENT_AMBULATORY_CARE_PROVIDER_SITE_OTHER): Payer: Medicare Other | Admitting: Emergency Medicine

## 2018-01-27 ENCOUNTER — Other Ambulatory Visit (INDEPENDENT_AMBULATORY_CARE_PROVIDER_SITE_OTHER): Payer: Medicare Other

## 2018-01-27 VITALS — BP 120/76 | HR 66 | Ht 63.0 in | Wt 153.0 lb

## 2018-01-27 DIAGNOSIS — R9389 Abnormal findings on diagnostic imaging of other specified body structures: Secondary | ICD-10-CM

## 2018-01-27 DIAGNOSIS — R0602 Shortness of breath: Secondary | ICD-10-CM | POA: Diagnosis not present

## 2018-01-27 DIAGNOSIS — Z87891 Personal history of nicotine dependence: Secondary | ICD-10-CM | POA: Diagnosis not present

## 2018-01-27 DIAGNOSIS — R590 Localized enlarged lymph nodes: Secondary | ICD-10-CM | POA: Diagnosis not present

## 2018-01-27 LAB — BASIC METABOLIC PANEL
BUN: 18 mg/dL (ref 6–23)
CALCIUM: 9.8 mg/dL (ref 8.4–10.5)
CO2: 25 mEq/L (ref 19–32)
CREATININE: 1.62 mg/dL — AB (ref 0.40–1.20)
Chloride: 94 mEq/L — ABNORMAL LOW (ref 96–112)
GFR: 41.5 mL/min — AB (ref >60.00)
GLUCOSE: 468 mg/dL — AB (ref 70–99)
Potassium: 4.2 mEq/L (ref 3.5–5.1)
Sodium: 129 mEq/L — ABNORMAL LOW (ref 135–145)

## 2018-01-27 MED ORDER — ALBUTEROL SULFATE HFA 108 (90 BASE) MCG/ACT IN AERS: 2.0000 | INHALATION_SPRAY | RESPIRATORY_TRACT | 5 refills | Status: DC | PRN

## 2018-01-27 NOTE — Assessment & Plan Note (Signed)
Congratulated her for quitting

## 2018-01-27 NOTE — Patient Instructions (Signed)
We will repeat your CT scan of the chest in approximately 2 weeks to look for interval improvement compared with your hospitalization in January 2019 Depending on the results of that CT scan we may decide to repeat your bronchoscopy We will start albuterol to see if you benefit.  Use 2 puffs up to every 4 hours if needed for shortness of breath, wheezing, chest tightness.  Please keep track of whether you benefit from the medication so we can discuss it at your next visit. Follow with Dr Lamonte Sakai in 1 month or next available after your CT scan to review the results together
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
## 2018-01-27 NOTE — Assessment & Plan Note (Signed)
Likely multifactorial but she has heard some wheezing.  Her pulmonary function testing from 04/2017 did not have any overt obstruction.  I believe would be reasonable to do a trial of albuterol to see if she benefits.  She will report back to me any clinical improvement.

## 2018-01-27 NOTE — Assessment & Plan Note (Signed)
Bilateral mediastinal lymphadenopathy of unclear etiology.  On this occasion it may have been associated with a left upper lobe pneumonia.  We have biopsied an enlarged right perihilar lesion in the past that was negative for malignancy.  I will repeat her CT scan in about 2 weeks to look for interval improvement.  If the lesions in the mediastinum remain that I believe we should schedule a repeat endobronchial ultrasound.

## 2018-02-03 ENCOUNTER — Inpatient Hospital Stay (HOSPITAL_COMMUNITY)
Admission: RE | Admit: 2018-02-03 | Payer: Medicare Other | Source: Ambulatory Visit | Attending: Cardiovascular Disease | Admitting: Cardiovascular Disease

## 2018-02-05 ENCOUNTER — Ambulatory Visit (HOSPITAL_COMMUNITY)
Admission: RE | Admit: 2018-02-05 | Discharge: 2018-02-05 | Disposition: A | Discharge status: Home / Self Care | Payer: Medicare Other | Source: Ambulatory Visit | Attending: Internal Medicine | Admitting: Internal Medicine

## 2018-02-05 DIAGNOSIS — I739 Peripheral vascular disease, unspecified: Secondary | ICD-10-CM | POA: Insufficient documentation

## 2018-02-05 DIAGNOSIS — Z9889 Other specified postprocedural states: Secondary | ICD-10-CM | POA: Diagnosis not present

## 2018-02-08 ENCOUNTER — Other Ambulatory Visit: Payer: Medicare Other

## 2018-02-09 ENCOUNTER — Ambulatory Visit (INDEPENDENT_AMBULATORY_CARE_PROVIDER_SITE_OTHER): Payer: Medicare Other | Admitting: Cardiovascular Disease

## 2018-02-09 ENCOUNTER — Encounter: Payer: Self-pay | Admitting: Cardiovascular Disease

## 2018-02-09 VITALS — BP 128/72 | HR 66 | Ht 62.0 in | Wt 155.0 lb

## 2018-02-09 DIAGNOSIS — I10 Essential (primary) hypertension: Secondary | ICD-10-CM

## 2018-02-09 DIAGNOSIS — E785 Hyperlipidemia, unspecified: Secondary | ICD-10-CM | POA: Diagnosis not present

## 2018-02-09 DIAGNOSIS — I25118 Atherosclerotic heart disease of native coronary artery with other forms of angina pectoris: Secondary | ICD-10-CM | POA: Diagnosis not present

## 2018-02-09 DIAGNOSIS — I739 Peripheral vascular disease, unspecified: Secondary | ICD-10-CM | POA: Diagnosis not present

## 2018-02-09 DIAGNOSIS — Z720 Tobacco use: Secondary | ICD-10-CM

## 2018-02-09 LAB — BASIC METABOLIC PANEL
BUN/Creatinine Ratio: 9 — ABNORMAL LOW (ref 12–28)
BUN: 13 mg/dL (ref 8–27)
CO2: 21 mmol/L (ref 20–29)
Calcium: 8.9 mg/dL (ref 8.7–10.3)
Chloride: 95 mmol/L — ABNORMAL LOW (ref 96–106)
Creatinine, Ser: 1.5 mg/dL — ABNORMAL HIGH (ref 0.57–1.00)
GFR calc Af Amer: 43 mL/min/{1.73_m2} — ABNORMAL LOW (ref >59)
GFR, EST NON AFRICAN AMERICAN: 37 mL/min/{1.73_m2} — AB (ref >59)
GLUCOSE: 387 mg/dL — AB (ref 65–99)
POTASSIUM: 3.8 mmol/L (ref 3.5–5.2)
SODIUM: 132 mmol/L — AB (ref 134–144)

## 2018-02-09 LAB — HEPATIC FUNCTION PANEL
ALBUMIN: 3.9 g/dL (ref 3.6–4.8)
ALT: 21 IU/L (ref 0–32)
AST: 27 IU/L (ref 0–40)
Alkaline Phosphatase: 142 IU/L — ABNORMAL HIGH (ref 39–117)
Bilirubin Total: <0.2 mg/dL (ref 0.0–1.2)
Bilirubin, Direct: 0.08 mg/dL (ref 0.00–0.40)
TOTAL PROTEIN: 6.8 g/dL (ref 6.0–8.5)

## 2018-02-09 LAB — LIPID PANEL
Chol/HDL Ratio: 3.5 ratio (ref 0.0–4.4)
Cholesterol, Total: 119 mg/dL (ref 100–199)
HDL: 34 mg/dL — ABNORMAL LOW (ref >39)
LDL CALC: 45 mg/dL (ref 0–99)
Triglycerides: 199 mg/dL — ABNORMAL HIGH (ref 0–149)
VLDL CHOLESTEROL CAL: 40 mg/dL (ref 5–40)
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
## 2018-02-09 NOTE — Patient Instructions (Signed)
Medication Instructions: Your physician recommends that you continue on your current medications as directed. Please refer to the Current Medication list given to you today.  If you need a refill on your cardiac medications before your next appointment, please call your pharmacy.   Labwork: Your provider would like for you to have the following labs today: BMET, LIPID AND LIVER   Procedures/Testing: Your physician has requested that you have an ankle brachial index (ABI) in 6 months. During this test an ultrasound and blood pressure cuff are used to evaluate the arteries that supply the arms and legs with blood. Allow thirty minutes for this exam. There are no restrictions or special instructions. This will take place at [place], [address].   Your physician has requested that you have a lower extremity arterial duplex in 6 months. During this test, ultrasound is used to evaluate arterial blood flow in the legs. Allow one hour for this exam. There are no restrictions or special instructions. This will take place at [place], [address].   Follow-Up: Your physician wants you to follow-up in 6 months with Dr. Fletcher Anon after the dopplers have been completed. You will receive a reminder letter in the mail two months in advance. If you don't receive a letter, please call our office at [phone] to schedule this follow-up appointment.   Thank you for choosing [facility] at [facility]!!

## 2018-02-10 ENCOUNTER — Ambulatory Visit (INDEPENDENT_AMBULATORY_CARE_PROVIDER_SITE_OTHER)
Admission: RE | Admit: 2018-02-10 | Discharge: 2018-02-10 | Disposition: A | Discharge status: Home / Self Care | Payer: Medicare Other | Source: Ambulatory Visit | Attending: Emergency Medicine | Admitting: Emergency Medicine

## 2018-02-10 DIAGNOSIS — R590 Localized enlarged lymph nodes: Secondary | ICD-10-CM | POA: Diagnosis not present

## 2018-02-10 MED ORDER — IOPAMIDOL (ISOVUE-300) INJECTION 61%
65.0000 mL | Freq: Once | INTRAVENOUS | Status: AC | PRN
  Administered 2018-02-10: 65 mL via INTRAVENOUS

## 2018-02-17 ENCOUNTER — Encounter (HOSPITAL_COMMUNITY): Payer: Medicare Other

## 2018-02-24 ENCOUNTER — Encounter: Payer: Self-pay | Admitting: Emergency Medicine

## 2018-02-24 ENCOUNTER — Ambulatory Visit (INDEPENDENT_AMBULATORY_CARE_PROVIDER_SITE_OTHER): Payer: Medicare Other | Admitting: Emergency Medicine

## 2018-02-24 DIAGNOSIS — I824Z9 Acute embolism and thrombosis of unspecified deep veins of unspecified distal lower extremity: Secondary | ICD-10-CM | POA: Diagnosis not present

## 2018-02-24 DIAGNOSIS — R9389 Abnormal findings on diagnostic imaging of other specified body structures: Secondary | ICD-10-CM | POA: Diagnosis not present

## 2018-02-24 DIAGNOSIS — R911 Solitary pulmonary nodule: Secondary | ICD-10-CM | POA: Diagnosis not present

## 2018-02-24 DIAGNOSIS — R042 Hemoptysis: Secondary | ICD-10-CM | POA: Diagnosis not present
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
## 2018-02-24 NOTE — Assessment & Plan Note (Signed)
Based on her clinical history, improvement in her CT scan of the chest, absence of any systemic symptoms or constitutional symptoms and clear lung exam I suspect that the blood she is seeing is coming from her tooth which has been bleeding some (on Xarelto).  She is planning to get this tooth fixed and if she continues to have bleeding after that repair then we will need to investigate further.

## 2018-02-24 NOTE — Assessment & Plan Note (Signed)
On Xarelto 

## 2018-02-24 NOTE — Patient Instructions (Signed)
We will plan to repeat your CT scan of the chest without contrast in March 2020 to look for interval change.  We will perform this test sooner if you develop any new symptoms including increased cough or any blood in your mucus. I believe that the blood that you seen is coming from your tooth.  If you continue to see blood in your mucus after the tooth has been repaired then you need to call us so that we can see you sooner. Follow with Dr Lamonte Sakai in 12 months or sooner if you have any problems

## 2018-02-24 NOTE — Assessment & Plan Note (Signed)
Her medial spinal lymphadenopathy, hilar adenopathy, left upper lobe infiltrate and associated bronchiectatic change have all improved.  She does still have 2 small right upper lobe nodules that will need to be followed.  She needs a CT scan without contrast in 1 year.

## 2018-03-05 ENCOUNTER — Telehealth: Payer: Self-pay | Admitting: Cardiovascular Disease

## 2018-03-05 NOTE — Telephone Encounter (Signed)
Left a message to call back.

## 2018-03-05 NOTE — Telephone Encounter (Signed)
Follow up     Patient returned call

## 2018-03-05 NOTE — Telephone Encounter (Signed)
New Message     Pt c/o swelling: STAT is pt has developed SOB within 24 hours  1) How much weight have you gained and in what time span? no  2) If swelling, where is the swelling located? Foot and ankle   3) Are you currently taking a fluid pill? Yes   4) Are you currently SOB? no  5) Do you have a log of your daily weights (if so, list)? no  6) Have you gained 3 pounds in a day or 5 pounds in a week? no  7) Have you traveled recently? No   Patient went to kidney doctor this morning and advised her to get her foot and ankle looked at , it is severely swollen and her foot is turning black

## 2018-03-05 NOTE — Telephone Encounter (Signed)
Left a message for the patient to go to the Emergency Department for further assessment if her foot was discolored, painful and/or warm or cold to the touch.

## 2018-03-08 NOTE — Telephone Encounter (Signed)
Left a message for the patient to call back.  

## 2018-03-08 NOTE — Telephone Encounter (Signed)
Message left with the patient that an appointment has been made for her tomorrow at 9:20 with Dr. Fletcher Anon. She has been asked to call and verify.

## 2018-03-08 NOTE — Telephone Encounter (Signed)
Spoke with the patient. She stated that starting on Friday her foot has started to swell, has increased pain and has started to discolor. She stated that it is warm to touch.  As of today, the swelling has gone down but it is still painful. She stated that it hurts when when she ambulates. Her foot is still discolored but it has not progressed. Message routed to the provider for his recommendation.

## 2018-03-09 ENCOUNTER — Ambulatory Visit (INDEPENDENT_AMBULATORY_CARE_PROVIDER_SITE_OTHER): Payer: Medicare Other | Admitting: Cardiovascular Disease

## 2018-03-09 ENCOUNTER — Other Ambulatory Visit: Payer: Self-pay

## 2018-03-09 ENCOUNTER — Encounter: Payer: Self-pay | Admitting: Cardiovascular Disease

## 2018-03-09 VITALS — BP 124/62 | HR 78 | Ht 63.0 in | Wt 157.0 lb

## 2018-03-09 DIAGNOSIS — E785 Hyperlipidemia, unspecified: Secondary | ICD-10-CM

## 2018-03-09 DIAGNOSIS — I2510 Atherosclerotic heart disease of native coronary artery without angina pectoris: Secondary | ICD-10-CM

## 2018-03-09 DIAGNOSIS — I779 Disorder of arteries and arterioles, unspecified: Secondary | ICD-10-CM | POA: Diagnosis not present

## 2018-03-09 DIAGNOSIS — I10 Essential (primary) hypertension: Secondary | ICD-10-CM

## 2018-03-09 DIAGNOSIS — I739 Peripheral vascular disease, unspecified: Secondary | ICD-10-CM | POA: Diagnosis not present

## 2018-03-09 MED ORDER — FUROSEMIDE 20 MG PO TABS
ORAL_TABLET | ORAL | 1 refills | Status: DC
Start: 2018-03-09 — End: 2018-03-23

## 2018-03-09 MED ORDER — POTASSIUM CHLORIDE CRYS ER 20 MEQ PO TBCR: 20.0000 meq | EXTENDED_RELEASE_TABLET | Freq: Every day | ORAL | 1 refills | Status: DC
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
## 2018-03-09 NOTE — Patient Instructions (Signed)
Medication Instructions: INCREASE the Furosemide to 40 mg in the morning and 20 mg in the afternoon START Potassium 20 meq daily  If you need a refill on your cardiac medications before your next appointment, please call your pharmacy.   Labwork: Your provider would like for you to return in one week to have the following labs drawn: BMET. You do not need an appointment for the lab. Once in our office lobby there is a podium where you can sign in and ring the doorbell to alert [place] that you are here. The lab is open from 8:00 am to 4:30 pm; closed for lunch from 12:45pm-1:45pm.   Follow-Up: Your physician wants you to follow-up on April 30th at 11:40 with Dr. Fletcher Anon.  Thank you for choosing Heartcare at [facility]!!

## 2018-03-09 NOTE — Telephone Encounter (Signed)
Patient is being seen today

## 2018-03-17 ENCOUNTER — Telehealth: Payer: Self-pay | Admitting: *Deleted

## 2018-03-17 NOTE — Telephone Encounter (Signed)
Message left with the patient as a reminder to get her labs drawn this week.

## 2018-03-20 LAB — BASIC METABOLIC PANEL
BUN/Creatinine Ratio: 16 (ref 12–28)
BUN: 29 mg/dL — ABNORMAL HIGH (ref 8–27)
CALCIUM: 9.5 mg/dL (ref 8.7–10.3)
CHLORIDE: 90 mmol/L — AB (ref 96–106)
CO2: 19 mmol/L — AB (ref 20–29)
Creatinine, Ser: 1.79 mg/dL — ABNORMAL HIGH (ref 0.57–1.00)
GFR calc Af Amer: 35 mL/min/{1.73_m2} — ABNORMAL LOW (ref >59)
GFR calc non Af Amer: 30 mL/min/{1.73_m2} — ABNORMAL LOW (ref >59)
Glucose: 469 mg/dL — ABNORMAL HIGH (ref 65–99)
POTASSIUM: 4.7 mmol/L (ref 3.5–5.2)
SODIUM: 128 mmol/L — AB (ref 134–144)

## 2018-03-23 ENCOUNTER — Encounter: Payer: Self-pay | Admitting: Cardiovascular Disease

## 2018-03-23 ENCOUNTER — Ambulatory Visit (INDEPENDENT_AMBULATORY_CARE_PROVIDER_SITE_OTHER): Payer: Medicare Other | Admitting: Cardiovascular Disease

## 2018-03-23 VITALS — BP 110/56 | HR 72 | Ht 63.0 in | Wt 151.0 lb

## 2018-03-23 DIAGNOSIS — I2510 Atherosclerotic heart disease of native coronary artery without angina pectoris: Secondary | ICD-10-CM | POA: Diagnosis not present

## 2018-03-23 DIAGNOSIS — E785 Hyperlipidemia, unspecified: Secondary | ICD-10-CM | POA: Diagnosis not present

## 2018-03-23 DIAGNOSIS — Z720 Tobacco use: Secondary | ICD-10-CM | POA: Diagnosis not present

## 2018-03-23 DIAGNOSIS — I739 Peripheral vascular disease, unspecified: Secondary | ICD-10-CM | POA: Diagnosis not present

## 2018-03-23 DIAGNOSIS — R739 Hyperglycemia, unspecified: Secondary | ICD-10-CM

## 2018-03-23 DIAGNOSIS — I779 Disorder of arteries and arterioles, unspecified: Secondary | ICD-10-CM

## 2018-03-23 MED ORDER — FUROSEMIDE 40 MG PO TABS: 40.0000 mg | ORAL_TABLET | Freq: Every day | ORAL | Status: DC

## 2018-03-23 NOTE — Patient Instructions (Addendum)
Medication Instructions: STOP the plavix DECREASE the Furosemide to 40 mg daily  If you need a refill on your cardiac medications before your next appointment, please call your pharmacy.    Follow-Up: Your physician wants you to follow-up in 3 months with Dr. Fletcher Anon.   Special Instructions: A referral has been placed for an Endocrinologist. Someone will be calling you to set up this appointment.  Thank you for choosing Heartcare at [facility]!!
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
## 2018-04-03 ENCOUNTER — Other Ambulatory Visit: Payer: Self-pay | Admitting: Cardiology

## 2018-04-14 ENCOUNTER — Telehealth: Payer: Self-pay | Admitting: Emergency Medicine

## 2018-04-14 MED ORDER — ALBUTEROL SULFATE HFA 108 (90 BASE) MCG/ACT IN AERS: 2.0000 | INHALATION_SPRAY | RESPIRATORY_TRACT | 5 refills | Status: AC | PRN

## 2018-04-14 NOTE — Telephone Encounter (Signed)
Anderson Malta, [facility] returned call, CB [phone] ext [phone].

## 2018-04-14 NOTE — Telephone Encounter (Signed)
lmtcb x1 for [employer] with [facility].

## 2018-04-14 NOTE — Telephone Encounter (Signed)
Called and spoke to [place] with [facility].  Anderson Malta stated that Ventolin is not preferred by insurance, however proair is.  Per office protocol Rx for Alejandra Hernandez has been sent to preferred pharmacy.  Nothing further is needed.

## 2018-04-22 ENCOUNTER — Encounter: Payer: Self-pay | Admitting: Endocrinology

## 2018-04-22 ENCOUNTER — Ambulatory Visit (INDEPENDENT_AMBULATORY_CARE_PROVIDER_SITE_OTHER): Payer: Medicare Other | Admitting: Endocrinology

## 2018-04-22 VITALS — BP 120/60 | HR 84 | Ht 63.0 in | Wt 155.0 lb

## 2018-04-22 DIAGNOSIS — E118 Type 2 diabetes mellitus with unspecified complications: Secondary | ICD-10-CM

## 2018-04-22 DIAGNOSIS — Z794 Long term (current) use of insulin: Secondary | ICD-10-CM

## 2018-04-22 DIAGNOSIS — E1051 Type 1 diabetes mellitus with diabetic peripheral angiopathy without gangrene: Secondary | ICD-10-CM | POA: Diagnosis not present

## 2018-04-22 LAB — POCT GLYCOSYLATED HEMOGLOBIN (HGB A1C): HEMOGLOBIN A1C: 13.4 % — AB (ref 4.0–5.6)

## 2018-04-22 MED ORDER — INSULIN GLARGINE 100 UNIT/ML SOLOSTAR PEN: 100.0000 [IU] | PEN_INJECTOR | SUBCUTANEOUS | 99 refills | Status: DC
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
## 2018-04-22 NOTE — Patient Instructions (Addendum)
good diet and exercise significantly improve the control of your diabetes.  please let me know if you wish to be referred to a dietician.  high blood sugar is very risky to your health.  you should see an eye doctor and dentist every year.  It is very important to get all recommended vaccinations.  Controlling your blood pressure and cholesterol drastically reduces the damage diabetes does to your body.  Those who smoke should quit.  Please discuss these with your doctor.  check your blood sugar twice a day.  vary the time of day when you check, between before the 3 meals, and at bedtime.  also check if you have symptoms of your blood sugar being too high or too low.  please keep a record of the readings and bring it to your next appointment here (or you can bring the meter itself).  You can write it on any piece of paper.  please call us sooner if your blood sugar goes below 70, or if you have a lot of readings over 200.  For now, please: Increase the lantus to 100 units in the morning (and none in the evening), and:  Please continue the same humalog.  Please call or message [place] next week, to tell us how the blood sugar is doing.   Please come back for a follow-up appointment in 2 months.

## 2018-04-28 ENCOUNTER — Other Ambulatory Visit: Payer: Self-pay | Admitting: Cardiology

## 2018-05-06 ENCOUNTER — Telehealth: Payer: Self-pay | Admitting: Endocrinology

## 2018-05-06 NOTE — Telephone Encounter (Signed)
Please Increase the lantus to 110 units in the morning (and none in the evening),

## 2018-05-06 NOTE — Telephone Encounter (Signed)
Patient is calling to give update on how everything is going since starting lantus. She stated she is taking the 100 units   She stated she was feeling sick at first but if feeling great now. And her sugars are running 160-180

## 2018-05-07 NOTE — Telephone Encounter (Signed)
I spoke with patient & she stated that she would try new dose then let us know how CBG's are doing.

## 2018-05-12 ENCOUNTER — Telehealth: Payer: Self-pay | Admitting: Cardiovascular Disease

## 2018-05-12 NOTE — Telephone Encounter (Signed)
Received records [facility] on 05/12/18, Appt 06/15/18 @ 10:40AM. [place]

## 2018-05-13 ENCOUNTER — Encounter (HOSPITAL_COMMUNITY): Payer: Self-pay | Admitting: *Deleted

## 2018-05-13 ENCOUNTER — Inpatient Hospital Stay (HOSPITAL_COMMUNITY)
Admission: EM | Admit: 2018-05-13 | Discharge: 2018-05-17 | DRG: 287 | Disposition: A | Discharge status: Home / Self Care | Payer: Medicare Other | Attending: Internal Medicine | Admitting: Internal Medicine

## 2018-05-13 ENCOUNTER — Emergency Department (HOSPITAL_COMMUNITY): Payer: Medicare Other

## 2018-05-13 DIAGNOSIS — I25110 Atherosclerotic heart disease of native coronary artery with unstable angina pectoris: Secondary | ICD-10-CM | POA: Diagnosis not present

## 2018-05-13 DIAGNOSIS — N183 Chronic kidney disease, stage 3 unspecified: Secondary | ICD-10-CM | POA: Diagnosis present

## 2018-05-13 DIAGNOSIS — E860 Dehydration: Secondary | ICD-10-CM | POA: Diagnosis present

## 2018-05-13 DIAGNOSIS — I5032 Chronic diastolic (congestive) heart failure: Secondary | ICD-10-CM | POA: Diagnosis not present

## 2018-05-13 DIAGNOSIS — I130 Hypertensive heart and chronic kidney disease with heart failure and stage 1 through stage 4 chronic kidney disease, or unspecified chronic kidney disease: Secondary | ICD-10-CM | POA: Diagnosis not present

## 2018-05-13 DIAGNOSIS — E1142 Type 2 diabetes mellitus with diabetic polyneuropathy: Secondary | ICD-10-CM | POA: Diagnosis not present

## 2018-05-13 DIAGNOSIS — E871 Hypo-osmolality and hyponatremia: Secondary | ICD-10-CM | POA: Diagnosis present

## 2018-05-13 DIAGNOSIS — Z955 Presence of coronary angioplasty implant and graft: Secondary | ICD-10-CM | POA: Diagnosis not present

## 2018-05-13 DIAGNOSIS — Z794 Long term (current) use of insulin: Secondary | ICD-10-CM

## 2018-05-13 DIAGNOSIS — D631 Anemia in chronic kidney disease: Secondary | ICD-10-CM | POA: Diagnosis present

## 2018-05-13 DIAGNOSIS — E785 Hyperlipidemia, unspecified: Secondary | ICD-10-CM | POA: Diagnosis present

## 2018-05-13 DIAGNOSIS — E1165 Type 2 diabetes mellitus with hyperglycemia: Secondary | ICD-10-CM | POA: Diagnosis present

## 2018-05-13 DIAGNOSIS — E1151 Type 2 diabetes mellitus with diabetic peripheral angiopathy without gangrene: Secondary | ICD-10-CM | POA: Diagnosis not present

## 2018-05-13 DIAGNOSIS — E669 Obesity, unspecified: Secondary | ICD-10-CM | POA: Diagnosis present

## 2018-05-13 DIAGNOSIS — Z86718 Personal history of other venous thrombosis and embolism: Secondary | ICD-10-CM | POA: Diagnosis not present

## 2018-05-13 DIAGNOSIS — Z7901 Long term (current) use of anticoagulants: Secondary | ICD-10-CM | POA: Diagnosis not present

## 2018-05-13 DIAGNOSIS — R079 Chest pain, unspecified: Secondary | ICD-10-CM | POA: Diagnosis present

## 2018-05-13 DIAGNOSIS — I208 Other forms of angina pectoris: Secondary | ICD-10-CM

## 2018-05-13 DIAGNOSIS — E876 Hypokalemia: Secondary | ICD-10-CM | POA: Diagnosis present

## 2018-05-13 DIAGNOSIS — N179 Acute kidney failure, unspecified: Secondary | ICD-10-CM | POA: Diagnosis not present

## 2018-05-13 DIAGNOSIS — K219 Gastro-esophageal reflux disease without esophagitis: Secondary | ICD-10-CM | POA: Diagnosis present

## 2018-05-13 DIAGNOSIS — I2089 Other forms of angina pectoris: Secondary | ICD-10-CM

## 2018-05-13 DIAGNOSIS — I252 Old myocardial infarction: Secondary | ICD-10-CM

## 2018-05-13 DIAGNOSIS — R338 Other retention of urine: Secondary | ICD-10-CM

## 2018-05-13 DIAGNOSIS — E1122 Type 2 diabetes mellitus with diabetic chronic kidney disease: Secondary | ICD-10-CM | POA: Diagnosis present

## 2018-05-13 DIAGNOSIS — R042 Hemoptysis: Secondary | ICD-10-CM

## 2018-05-13 DIAGNOSIS — Z87891 Personal history of nicotine dependence: Secondary | ICD-10-CM | POA: Diagnosis not present

## 2018-05-13 DIAGNOSIS — R339 Retention of urine, unspecified: Secondary | ICD-10-CM | POA: Diagnosis present

## 2018-05-13 DIAGNOSIS — Z6827 Body mass index (BMI) 27.0-27.9, adult: Secondary | ICD-10-CM

## 2018-05-13 DIAGNOSIS — R109 Unspecified abdominal pain: Secondary | ICD-10-CM | POA: Diagnosis not present

## 2018-05-13 DIAGNOSIS — E118 Type 2 diabetes mellitus with unspecified complications: Secondary | ICD-10-CM

## 2018-05-13 DIAGNOSIS — I2510 Atherosclerotic heart disease of native coronary artery without angina pectoris: Secondary | ICD-10-CM | POA: Diagnosis present

## 2018-05-13 DIAGNOSIS — I10 Essential (primary) hypertension: Secondary | ICD-10-CM | POA: Diagnosis present

## 2018-05-13 DIAGNOSIS — Z981 Arthrodesis status: Secondary | ICD-10-CM

## 2018-05-13 DIAGNOSIS — I493 Ventricular premature depolarization: Secondary | ICD-10-CM | POA: Diagnosis not present

## 2018-05-13 DIAGNOSIS — I959 Hypotension, unspecified: Secondary | ICD-10-CM | POA: Diagnosis present

## 2018-05-13 DIAGNOSIS — D509 Iron deficiency anemia, unspecified: Secondary | ICD-10-CM | POA: Diagnosis present

## 2018-05-13 LAB — BASIC METABOLIC PANEL
ANION GAP: 10 (ref 5–15)
BUN: 17 mg/dL (ref 6–20)
CHLORIDE: 93 mmol/L — AB (ref 101–111)
CO2: 21 mmol/L — AB (ref 22–32)
Calcium: 8.6 mg/dL — ABNORMAL LOW (ref 8.9–10.3)
Creatinine, Ser: 1.54 mg/dL — ABNORMAL HIGH (ref 0.44–1.00)
GFR calc non Af Amer: 35 mL/min — ABNORMAL LOW (ref >60)
GFR, EST AFRICAN AMERICAN: 41 mL/min — AB (ref >60)
Glucose, Bld: 87 mg/dL (ref 65–99)
POTASSIUM: 4.6 mmol/L (ref 3.5–5.1)
SODIUM: 124 mmol/L — AB (ref 135–145)

## 2018-05-13 LAB — CBC
HCT: 33.9 % — ABNORMAL LOW (ref 36.0–46.0)
HEMOGLOBIN: 10.3 g/dL — AB (ref 12.0–15.0)
MCH: 20.6 pg — AB (ref 26.0–34.0)
MCHC: 30.4 g/dL (ref 30.0–36.0)
MCV: 67.7 fL — AB (ref 78.0–100.0)
Platelets: 212 10*3/uL (ref 150–400)
RBC: 5.01 MIL/uL (ref 3.87–5.11)
RDW: 16.4 % — ABNORMAL HIGH (ref 11.5–15.5)
WBC: 10.3 10*3/uL (ref 4.0–10.5)

## 2018-05-13 LAB — I-STAT TROPONIN, ED: Troponin i, poc: 0.01 ng/mL (ref 0.00–0.08)

## 2018-05-13 MED ORDER — MORPHINE SULFATE (PF) 4 MG/ML IV SOLN
4.0000 mg | Freq: Once | INTRAVENOUS | Status: AC
  Administered 2018-05-13: 4 mg via INTRAVENOUS
  Filled 2018-05-13: qty 1

## 2018-05-13 MED ORDER — ONDANSETRON HCL 4 MG/2ML IJ SOLN
4.0000 mg | Freq: Once | INTRAMUSCULAR | Status: AC
  Administered 2018-05-13: 4 mg via INTRAVENOUS
  Filled 2018-05-13: qty 2

## 2018-05-13 NOTE — ED Triage Notes (Signed)
Pt arrived by EMS for chest pain and weakness. Pt took 4 of her own nitro and 2x 81mg  ASA. EMS gave additional one nitro and another nitro without improvement. Pt noted to be very weak and lightheaded when ambulating with her cane.
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
## 2018-05-13 NOTE — ED Notes (Signed)
Pt's oxygen sats noted to be 87% on room air; 2L [place] applied

## 2018-05-14 ENCOUNTER — Other Ambulatory Visit: Payer: Self-pay

## 2018-05-14 ENCOUNTER — Encounter (HOSPITAL_COMMUNITY): Payer: Self-pay | Admitting: Internal Medicine

## 2018-05-14 DIAGNOSIS — E871 Hypo-osmolality and hyponatremia: Secondary | ICD-10-CM

## 2018-05-14 DIAGNOSIS — Z794 Long term (current) use of insulin: Secondary | ICD-10-CM | POA: Diagnosis not present

## 2018-05-14 DIAGNOSIS — N183 Chronic kidney disease, stage 3 unspecified: Secondary | ICD-10-CM | POA: Diagnosis present

## 2018-05-14 DIAGNOSIS — E785 Hyperlipidemia, unspecified: Secondary | ICD-10-CM | POA: Diagnosis not present

## 2018-05-14 DIAGNOSIS — R079 Chest pain, unspecified: Secondary | ICD-10-CM

## 2018-05-14 DIAGNOSIS — E1142 Type 2 diabetes mellitus with diabetic polyneuropathy: Secondary | ICD-10-CM | POA: Diagnosis not present

## 2018-05-14 DIAGNOSIS — E118 Type 2 diabetes mellitus with unspecified complications: Secondary | ICD-10-CM | POA: Diagnosis not present

## 2018-05-14 DIAGNOSIS — R109 Unspecified abdominal pain: Secondary | ICD-10-CM | POA: Diagnosis not present

## 2018-05-14 DIAGNOSIS — I252 Old myocardial infarction: Secondary | ICD-10-CM | POA: Diagnosis not present

## 2018-05-14 DIAGNOSIS — N179 Acute kidney failure, unspecified: Secondary | ICD-10-CM | POA: Diagnosis not present

## 2018-05-14 DIAGNOSIS — Z7901 Long term (current) use of anticoagulants: Secondary | ICD-10-CM | POA: Diagnosis not present

## 2018-05-14 DIAGNOSIS — R042 Hemoptysis: Secondary | ICD-10-CM | POA: Diagnosis not present

## 2018-05-14 DIAGNOSIS — I493 Ventricular premature depolarization: Secondary | ICD-10-CM | POA: Diagnosis not present

## 2018-05-14 DIAGNOSIS — R339 Retention of urine, unspecified: Secondary | ICD-10-CM | POA: Diagnosis not present

## 2018-05-14 DIAGNOSIS — I130 Hypertensive heart and chronic kidney disease with heart failure and stage 1 through stage 4 chronic kidney disease, or unspecified chronic kidney disease: Secondary | ICD-10-CM | POA: Diagnosis not present

## 2018-05-14 DIAGNOSIS — Z955 Presence of coronary angioplasty implant and graft: Secondary | ICD-10-CM | POA: Diagnosis not present

## 2018-05-14 DIAGNOSIS — Z86718 Personal history of other venous thrombosis and embolism: Secondary | ICD-10-CM | POA: Diagnosis not present

## 2018-05-14 DIAGNOSIS — I5032 Chronic diastolic (congestive) heart failure: Secondary | ICD-10-CM | POA: Diagnosis not present

## 2018-05-14 DIAGNOSIS — I25110 Atherosclerotic heart disease of native coronary artery with unstable angina pectoris: Secondary | ICD-10-CM | POA: Diagnosis not present

## 2018-05-14 DIAGNOSIS — E860 Dehydration: Secondary | ICD-10-CM | POA: Diagnosis not present

## 2018-05-14 DIAGNOSIS — I10 Essential (primary) hypertension: Secondary | ICD-10-CM | POA: Diagnosis not present

## 2018-05-14 DIAGNOSIS — E876 Hypokalemia: Secondary | ICD-10-CM | POA: Diagnosis not present

## 2018-05-14 DIAGNOSIS — E1151 Type 2 diabetes mellitus with diabetic peripheral angiopathy without gangrene: Secondary | ICD-10-CM | POA: Diagnosis not present

## 2018-05-14 DIAGNOSIS — K219 Gastro-esophageal reflux disease without esophagitis: Secondary | ICD-10-CM | POA: Diagnosis not present

## 2018-05-14 DIAGNOSIS — D631 Anemia in chronic kidney disease: Secondary | ICD-10-CM | POA: Diagnosis not present

## 2018-05-14 DIAGNOSIS — I200 Unstable angina: Secondary | ICD-10-CM | POA: Insufficient documentation

## 2018-05-14 DIAGNOSIS — Z87891 Personal history of nicotine dependence: Secondary | ICD-10-CM | POA: Diagnosis not present

## 2018-05-14 DIAGNOSIS — E1122 Type 2 diabetes mellitus with diabetic chronic kidney disease: Secondary | ICD-10-CM | POA: Diagnosis not present

## 2018-05-14 LAB — BRAIN NATRIURETIC PEPTIDE
B NATRIURETIC PEPTIDE 5: 49.2 pg/mL (ref 0.0–100.0)
B Natriuretic Peptide: 71.7 pg/mL (ref 0.0–100.0)

## 2018-05-14 LAB — CBG MONITORING, ED
Glucose-Capillary: 76 mg/dL (ref 65–99)
Glucose-Capillary: 82 mg/dL (ref 65–99)

## 2018-05-14 LAB — BASIC METABOLIC PANEL
Anion gap: 14 (ref 5–15)
BUN: 15 mg/dL (ref 6–20)
CO2: 20 mmol/L — AB (ref 22–32)
Calcium: 8.8 mg/dL — ABNORMAL LOW (ref 8.9–10.3)
Chloride: 96 mmol/L — ABNORMAL LOW (ref 101–111)
Creatinine, Ser: 1.4 mg/dL — ABNORMAL HIGH (ref 0.44–1.00)
GFR calc Af Amer: 46 mL/min — ABNORMAL LOW (ref >60)
GFR calc non Af Amer: 40 mL/min — ABNORMAL LOW (ref >60)
GLUCOSE: 86 mg/dL (ref 65–99)
POTASSIUM: 3.6 mmol/L (ref 3.5–5.1)
Sodium: 130 mmol/L — ABNORMAL LOW (ref 135–145)

## 2018-05-14 LAB — TROPONIN I: Troponin I: <0.03 ng/mL (ref <0.03)

## 2018-05-14 LAB — GLUCOSE, CAPILLARY
GLUCOSE-CAPILLARY: 211 mg/dL — AB (ref 65–99)
Glucose-Capillary: 100 mg/dL — ABNORMAL HIGH (ref 65–99)
Glucose-Capillary: 147 mg/dL — ABNORMAL HIGH (ref 65–99)

## 2018-05-14 LAB — MAGNESIUM: Magnesium: 1.4 mg/dL — ABNORMAL LOW (ref 1.7–2.4)

## 2018-05-14 LAB — POTASSIUM: POTASSIUM: 2.9 mmol/L — AB (ref 3.5–5.1)

## 2018-05-14 LAB — MRSA PCR SCREENING: MRSA by PCR: NEGATIVE

## 2018-05-14 MED ORDER — ACETAMINOPHEN 325 MG PO TABS
650.0000 mg | ORAL_TABLET | ORAL | Status: DC | PRN
  Administered 2018-05-16: 650 mg via ORAL
  Filled 2018-05-14: qty 2

## 2018-05-14 MED ORDER — FUROSEMIDE 10 MG/ML IJ SOLN: 20.0000 mg | Freq: Once | INTRAMUSCULAR | Status: DC

## 2018-05-14 MED ORDER — OXYCODONE-ACETAMINOPHEN 10-325 MG PO TABS: 1.0000 | ORAL_TABLET | Freq: Three times a day (TID) | ORAL | Status: DC

## 2018-05-14 MED ORDER — FERROUS SULFATE 325 (65 FE) MG PO TABS
325.0000 mg | ORAL_TABLET | Freq: Two times a day (BID) | ORAL | Status: DC
  Administered 2018-05-14 – 2018-05-17 (×8): 325 mg via ORAL
  Filled 2018-05-14 (×9): qty 1

## 2018-05-14 MED ORDER — RIVAROXABAN 20 MG PO TABS: 20.0000 mg | ORAL_TABLET | Freq: Every day | ORAL | Status: DC

## 2018-05-14 MED ORDER — POTASSIUM CHLORIDE CRYS ER 20 MEQ PO TBCR
20.0000 meq | EXTENDED_RELEASE_TABLET | Freq: Every day | ORAL | Status: DC
  Administered 2018-05-14 – 2018-05-17 (×4): 20 meq via ORAL
  Filled 2018-05-14 (×4): qty 1

## 2018-05-14 MED ORDER — LOSARTAN POTASSIUM 25 MG PO TABS
25.0000 mg | ORAL_TABLET | ORAL | Status: DC
  Filled 2018-05-14: qty 1

## 2018-05-14 MED ORDER — MAGNESIUM SULFATE 2 GM/50ML IV SOLN
2.0000 g | Freq: Once | INTRAVENOUS | Status: AC
  Administered 2018-05-14: 2 g via INTRAVENOUS
  Filled 2018-05-14: qty 50

## 2018-05-14 MED ORDER — GABAPENTIN 300 MG PO CAPS
300.0000 mg | ORAL_CAPSULE | Freq: Three times a day (TID) | ORAL | Status: DC
  Administered 2018-05-14 – 2018-05-15 (×4): 300 mg via ORAL
  Filled 2018-05-14 (×4): qty 1

## 2018-05-14 MED ORDER — EZETIMIBE 10 MG PO TABS
10.0000 mg | ORAL_TABLET | Freq: Every day | ORAL | Status: DC
  Administered 2018-05-14 – 2018-05-17 (×4): 10 mg via ORAL
  Filled 2018-05-14 (×4): qty 1

## 2018-05-14 MED ORDER — METOPROLOL TARTRATE 12.5 MG HALF TABLET
12.5000 mg | ORAL_TABLET | Freq: Two times a day (BID) | ORAL | Status: DC
  Administered 2018-05-14 – 2018-05-15 (×3): 12.5 mg via ORAL
  Filled 2018-05-14 (×3): qty 1

## 2018-05-14 MED ORDER — ISOSORBIDE MONONITRATE ER 30 MG PO TB24: 90.0000 mg | ORAL_TABLET | Freq: Every day | ORAL | Status: DC

## 2018-05-14 MED ORDER — PANTOPRAZOLE SODIUM 40 MG PO TBEC
40.0000 mg | DELAYED_RELEASE_TABLET | Freq: Every day | ORAL | Status: DC
  Administered 2018-05-14 – 2018-05-17 (×4): 40 mg via ORAL
  Filled 2018-05-14 (×4): qty 1

## 2018-05-14 MED ORDER — OXYCODONE-ACETAMINOPHEN 5-325 MG PO TABS
1.0000 | ORAL_TABLET | Freq: Three times a day (TID) | ORAL | Status: DC
  Administered 2018-05-14 – 2018-05-17 (×11): 1 via ORAL
  Filled 2018-05-14 (×11): qty 1

## 2018-05-14 MED ORDER — ROSUVASTATIN CALCIUM 20 MG PO TABS
40.0000 mg | ORAL_TABLET | Freq: Every day | ORAL | Status: DC
  Administered 2018-05-14 – 2018-05-16 (×3): 40 mg via ORAL
  Filled 2018-05-14 (×3): qty 2

## 2018-05-14 MED ORDER — AMLODIPINE BESYLATE 10 MG PO TABS
10.0000 mg | ORAL_TABLET | Freq: Every day | ORAL | Status: DC
  Administered 2018-05-14 – 2018-05-16 (×3): 10 mg via ORAL
  Filled 2018-05-14 (×3): qty 1

## 2018-05-14 MED ORDER — SODIUM CHLORIDE 0.9 % IV BOLUS
500.0000 mL | Freq: Once | INTRAVENOUS | Status: AC
  Administered 2018-05-14: 500 mL via INTRAVENOUS

## 2018-05-14 MED ORDER — AMITRIPTYLINE HCL 100 MG PO TABS
50.0000 mg | ORAL_TABLET | Freq: Every day | ORAL | Status: DC
Start: 2018-05-14 — End: 2018-05-17
  Administered 2018-05-14 – 2018-05-16 (×3): 50 mg via ORAL
  Filled 2018-05-14 (×3): qty 1

## 2018-05-14 MED ORDER — INSULIN GLARGINE 100 UNIT/ML ~~LOC~~ SOLN
90.0000 [IU] | Freq: Every day | SUBCUTANEOUS | Status: DC
  Administered 2018-05-15: 90 [IU] via SUBCUTANEOUS
  Filled 2018-05-14 (×2): qty 0.9

## 2018-05-14 MED ORDER — ONDANSETRON HCL 4 MG/2ML IJ SOLN: 4.0000 mg | Freq: Four times a day (QID) | INTRAMUSCULAR | Status: DC | PRN

## 2018-05-14 MED ORDER — POTASSIUM CHLORIDE 10 MEQ/100ML IV SOLN
10.0000 meq | Freq: Once | INTRAVENOUS | Status: AC
  Administered 2018-05-14: 10 meq via INTRAVENOUS
  Filled 2018-05-14: qty 100

## 2018-05-14 MED ORDER — INSULIN GLARGINE 100 UNIT/ML SOLOSTAR PEN: 90.0000 [IU] | PEN_INJECTOR | SUBCUTANEOUS | Status: DC

## 2018-05-14 MED ORDER — ALPRAZOLAM 0.5 MG PO TABS
0.5000 mg | ORAL_TABLET | Freq: Two times a day (BID) | ORAL | Status: DC | PRN
  Administered 2018-05-16: 0.5 mg via ORAL
  Filled 2018-05-14: qty 1

## 2018-05-14 MED ORDER — OXYCODONE HCL 5 MG PO TABS
5.0000 mg | ORAL_TABLET | Freq: Three times a day (TID) | ORAL | Status: DC
  Administered 2018-05-14 – 2018-05-17 (×11): 5 mg via ORAL
  Filled 2018-05-14 (×11): qty 1

## 2018-05-14 MED ORDER — HEPARIN (PORCINE) IN NACL 100-0.45 UNIT/ML-% IJ SOLN
1050.0000 [IU]/h | INTRAMUSCULAR | Status: DC
  Administered 2018-05-14: 1100 [IU]/h via INTRAVENOUS
  Administered 2018-05-17: 900 [IU]/h via INTRAVENOUS
  Filled 2018-05-14 (×4): qty 250

## 2018-05-14 MED ORDER — WHITE PETROLATUM EX OINT
TOPICAL_OINTMENT | CUTANEOUS | Status: AC
  Administered 2018-05-14: 0.2
  Filled 2018-05-14: qty 28.35

## 2018-05-14 MED ORDER — NITROGLYCERIN IN D5W 200-5 MCG/ML-% IV SOLN
0.0000 ug/min | INTRAVENOUS | Status: DC
  Administered 2018-05-14: 5 ug/min via INTRAVENOUS
  Filled 2018-05-14: qty 250

## 2018-05-14 MED ORDER — INSULIN ASPART 100 UNIT/ML ~~LOC~~ SOLN
0.0000 [IU] | SUBCUTANEOUS | Status: DC
  Administered 2018-05-14: 1 [IU] via SUBCUTANEOUS
  Administered 2018-05-14: 3 [IU] via SUBCUTANEOUS
  Administered 2018-05-15: 1 [IU] via SUBCUTANEOUS
  Administered 2018-05-15 – 2018-05-16 (×2): 2 [IU] via SUBCUTANEOUS
  Administered 2018-05-16: 1 [IU] via SUBCUTANEOUS
  Administered 2018-05-16: 2 [IU] via SUBCUTANEOUS
  Administered 2018-05-16: 1 [IU] via SUBCUTANEOUS
  Administered 2018-05-16: 2 [IU] via SUBCUTANEOUS

## 2018-05-14 MED ORDER — NITROGLYCERIN 0.4 MG SL SUBL: 0.4000 mg | SUBLINGUAL_TABLET | SUBLINGUAL | Status: DC | PRN

## 2018-05-14 MED ORDER — CLONIDINE HCL 0.2 MG PO TABS
0.2000 mg | ORAL_TABLET | Freq: Three times a day (TID) | ORAL | Status: DC
  Administered 2018-05-14 – 2018-05-15 (×4): 0.2 mg via ORAL
  Filled 2018-05-14 (×4): qty 1

## 2018-05-14 MED ORDER — SODIUM CHLORIDE 0.9 % IV SOLN
INTRAVENOUS | Status: DC
  Administered 2018-05-14: 02:00:00 via INTRAVENOUS

## 2018-05-14 NOTE — ED Notes (Signed)
Pt reports increase in chest pain, very drowsy, easy to arouse

## 2018-05-14 NOTE — ED Notes (Signed)
Pt noted to be hypotensive, remains a&O x3. Dr.Plunkett aware, fluids hanging
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
## 2018-05-14 NOTE — ED Notes (Signed)
CBG was taken, 82. Notified Nurse, mental health.

## 2018-05-14 NOTE — Progress Notes (Signed)
ANTICOAGULATION CONSULT NOTE - Initial Consult  Pharmacy Consult for heparin Indication: history of DVT  No Known Allergies  Patient Measurements: Height: 5\' 3"  (160 cm) Weight: 155 lb (70.3 kg) IBW/kg (Calculated) : 52.4 Heparin Dosing Weight: 66.9kg  Vital Signs: Temp: 97.6 F (36.4 C) (06/20 2210) Temp Source: Oral (06/20 2210) BP: 137/56 (06/21 0730) Pulse Rate: 82 (06/21 0730)  Labs: Recent Labs    05/13/18 2239  HGB 10.3*  HCT 33.9*  PLT 212  CREATININE 1.54*    Estimated Creatinine Clearance: 36.1 mL/min (A) (by C-G formula based on SCr of 1.54 mg/dL (H)).   Medical History: Past Medical History:  Diagnosis Date  . Anemia   . Anxiety   . Arthritis    "right knee, back, feet, hands" (01/20/2018)  . Bleeding stomach ulcer 01/2016  . CAD (coronary artery disease)    05/19/17 [facility] to [facility]  . Carotid artery disease ([place])    40-59% right and 1-39% left by doppler 05/2017  . Chronic lower back pain   . CKD (chronic kidney disease) stage 3, GFR 30-59 ml/min (HCC) 01/2016  . Diabetic peripheral neuropathy ([place])   . DVT (deep venous thrombosis) ([place])    bilateral diagnosed 01/2017  . GERD (gastroesophageal reflux disease)   . Gout   . Headache    "weekly" (2/27//2019)  . Heart murmur   . History of blood transfusion 2017   "related to stomach bleeding"  . Hyperlipidemia   . Hypertension   . NSTEMI (non-ST elevated myocardial infarction) ([place]) 05/2017  . PAD (peripheral artery disease) ([place])    a. ABIs 7/17: R 0.73, L 0.64, waveforms suggest bilateral SFA disease b. 07/30/16 balloon angioplasty to left SFA with atherectomy   . Pneumonia    "several times" (01/20/2018  . Type II diabetes mellitus (HCC)     Medications:  Infusions:  . heparin    . nitroGLYCERIN      Assessment: 74 yof presented to the ED with CP. She is on chronic xarelto for history of DVT. She will transition to IV heparin for now. Baseline Hgb is slightly low and platelets are  WNL. No bleeding noted. Her last dose of xarelto was 6/20 at 1800. Will plan to use aPTT to guide dosing initially.   Goal of Therapy:  Heparin level 0.3-0.7 units/ml aPTT 66-102 seconds Monitor platelets by anticoagulation protocol: Yes   Plan:  Check baseline heparin level and aPTT Heparin gtt 1100 units/hr at 1800 tonight - 24 hours after last xarelto dose Check an 8 hr aPTT Daily aPTT, heparin level and CBC F/u restart xarelto  Dryden Tapley, Rande Lawman 05/14/2018,8:23 AM
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
## 2018-05-14 NOTE — ED Notes (Signed)
Bladder scan completed. >999 ml scanned.

## 2018-05-14 NOTE — ED Notes (Signed)
Admitting paged to RN Katie regarding pt bed assignment per her request

## 2018-05-14 NOTE — Progress Notes (Signed)
Pt. Concerned about lower extremity coloring change. Pulses strong. Well continue to monitor.

## 2018-05-14 NOTE — ED Notes (Signed)
Admitting repaged to RN Katie regarding patient needing to be stepdown instead of telemetry

## 2018-05-14 NOTE — ED Notes (Signed)
Pt having active chest pain when woken up. Text page sent to Dr.Kakrakandy for stepdown bed

## 2018-05-14 NOTE — ED Notes (Signed)
Dr. Opyd at bedside  
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
## 2018-05-14 NOTE — ED Notes (Signed)
Admitting MD at bedside speaking with patient

## 2018-05-15 ENCOUNTER — Inpatient Hospital Stay (HOSPITAL_COMMUNITY): Payer: Medicare Other

## 2018-05-15 DIAGNOSIS — Z86718 Personal history of other venous thrombosis and embolism: Secondary | ICD-10-CM

## 2018-05-15 DIAGNOSIS — E118 Type 2 diabetes mellitus with unspecified complications: Secondary | ICD-10-CM | POA: Diagnosis not present

## 2018-05-15 DIAGNOSIS — R042 Hemoptysis: Secondary | ICD-10-CM

## 2018-05-15 DIAGNOSIS — I10 Essential (primary) hypertension: Secondary | ICD-10-CM | POA: Diagnosis not present

## 2018-05-15 DIAGNOSIS — E871 Hypo-osmolality and hyponatremia: Secondary | ICD-10-CM | POA: Diagnosis not present

## 2018-05-15 DIAGNOSIS — I739 Peripheral vascular disease, unspecified: Secondary | ICD-10-CM

## 2018-05-15 DIAGNOSIS — R079 Chest pain, unspecified: Secondary | ICD-10-CM | POA: Diagnosis not present

## 2018-05-15 DIAGNOSIS — I5032 Chronic diastolic (congestive) heart failure: Secondary | ICD-10-CM | POA: Diagnosis not present

## 2018-05-15 DIAGNOSIS — N183 Chronic kidney disease, stage 3 (moderate): Secondary | ICD-10-CM | POA: Diagnosis not present

## 2018-05-15 DIAGNOSIS — I25110 Atherosclerotic heart disease of native coronary artery with unstable angina pectoris: Secondary | ICD-10-CM | POA: Diagnosis not present

## 2018-05-15 DIAGNOSIS — Z794 Long term (current) use of insulin: Secondary | ICD-10-CM | POA: Diagnosis not present

## 2018-05-15 LAB — GLUCOSE, CAPILLARY
GLUCOSE-CAPILLARY: 133 mg/dL — AB (ref 65–99)
GLUCOSE-CAPILLARY: 156 mg/dL — AB (ref 65–99)
GLUCOSE-CAPILLARY: 72 mg/dL (ref 65–99)
GLUCOSE-CAPILLARY: 130 mg/dL — AB (ref 65–99)
Glucose-Capillary: 122 mg/dL — ABNORMAL HIGH (ref 65–99)
Glucose-Capillary: 81 mg/dL (ref 65–99)

## 2018-05-15 LAB — COMPREHENSIVE METABOLIC PANEL
ALBUMIN: 2.9 g/dL — AB (ref 3.5–5.0)
ALT: 17 U/L (ref 14–54)
AST: 25 U/L (ref 15–41)
Alkaline Phosphatase: 100 U/L (ref 38–126)
Anion gap: 7 (ref 5–15)
BUN: 16 mg/dL (ref 6–20)
CHLORIDE: 103 mmol/L (ref 101–111)
CO2: 24 mmol/L (ref 22–32)
CREATININE: 1.45 mg/dL — AB (ref 0.44–1.00)
Calcium: 8.8 mg/dL — ABNORMAL LOW (ref 8.9–10.3)
GFR calc Af Amer: 44 mL/min — ABNORMAL LOW (ref >60)
GFR, EST NON AFRICAN AMERICAN: 38 mL/min — AB (ref >60)
GLUCOSE: 139 mg/dL — AB (ref 65–99)
POTASSIUM: 4.7 mmol/L (ref 3.5–5.1)
Sodium: 134 mmol/L — ABNORMAL LOW (ref 135–145)
Total Bilirubin: 0.5 mg/dL (ref 0.3–1.2)
Total Protein: 5.9 g/dL — ABNORMAL LOW (ref 6.5–8.1)

## 2018-05-15 LAB — MAGNESIUM: MAGNESIUM: 1.9 mg/dL (ref 1.7–2.4)

## 2018-05-15 LAB — APTT
APTT: 145 s — AB (ref 24–36)
APTT: 122 s — AB (ref 24–36)
aPTT: 67 seconds — ABNORMAL HIGH (ref 24–36)

## 2018-05-15 LAB — CBC
HCT: 31.2 % — ABNORMAL LOW (ref 36.0–46.0)
HEMOGLOBIN: 9.3 g/dL — AB (ref 12.0–15.0)
MCH: 20.5 pg — AB (ref 26.0–34.0)
MCHC: 29.8 g/dL — ABNORMAL LOW (ref 30.0–36.0)
MCV: 68.9 fL — AB (ref 78.0–100.0)
Platelets: 250 10*3/uL (ref 150–400)
RBC: 4.53 MIL/uL (ref 3.87–5.11)
RDW: 16.5 % — ABNORMAL HIGH (ref 11.5–15.5)
WBC: 7.5 10*3/uL (ref 4.0–10.5)

## 2018-05-15 LAB — HEPARIN LEVEL (UNFRACTIONATED)
Heparin Unfractionated: 1.08 IU/mL — ABNORMAL HIGH (ref 0.30–0.70)
Heparin Unfractionated: 0.84 IU/mL — ABNORMAL HIGH (ref 0.30–0.70)

## 2018-05-15 MED ORDER — METOPROLOL TARTRATE 25 MG PO TABS
25.0000 mg | ORAL_TABLET | Freq: Two times a day (BID) | ORAL | Status: DC
  Administered 2018-05-15 – 2018-05-16 (×2): 25 mg via ORAL
  Filled 2018-05-15: qty 1
  Filled 2018-05-15: qty 2

## 2018-05-15 MED ORDER — CLONIDINE HCL 0.1 MG PO TABS
0.1000 mg | ORAL_TABLET | Freq: Three times a day (TID) | ORAL | Status: DC
  Administered 2018-05-15 – 2018-05-16 (×2): 0.1 mg via ORAL
  Filled 2018-05-15 (×3): qty 1

## 2018-05-15 MED ORDER — GABAPENTIN 400 MG PO CAPS
400.0000 mg | ORAL_CAPSULE | Freq: Three times a day (TID) | ORAL | Status: DC
  Administered 2018-05-15 – 2018-05-17 (×7): 400 mg via ORAL
  Filled 2018-05-15 (×7): qty 1

## 2018-05-15 MED ORDER — PRO-STAT SUGAR FREE PO LIQD
30.0000 mL | Freq: Two times a day (BID) | ORAL | Status: DC
  Administered 2018-05-15 – 2018-05-16 (×2): 30 mL via ORAL
  Filled 2018-05-15 (×3): qty 30

## 2018-05-15 NOTE — Progress Notes (Signed)
Pt. Concerned about red sputum. MD called. Will continue heparin and wait for Chest x-ray results.
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
## 2018-05-15 NOTE — Progress Notes (Signed)
[place] for Heparin (Xarelto on hold) Indication: history of DVT  No Known Allergies  Patient Measurements: Height: 5\' 3"  (160 cm) Weight: 158 lb 1.1 oz (71.7 kg) IBW/kg (Calculated) : 52.4 Heparin Dosing Weight: 66.9kg  Vital Signs: Temp: 98.2 F (36.8 C) (06/22 1133) Temp Source: Oral (06/22 1133) BP: 91/62 (06/22 1133) Pulse Rate: 56 (06/22 1133)  Labs: Recent Labs    05/13/18 2239 05/14/18 0816 05/14/18 1714 05/15/18 0232 05/15/18 1239  HGB 10.3*  --   --  9.3*  --   HCT 33.9*  --   --  31.2*  --   PLT 212  --   --  250  --   APTT  --   --   --  145* 122*  HEPARINUNFRC  --   --   --  1.08* 0.84*  CREATININE 1.54* 1.40*  --  1.45*  --   TROPONINI  --  <0.03 <0.03  --   --     Estimated Creatinine Clearance: 38.7 mL/min (A) (by C-G formula based on SCr of 1.45 mg/dL (H)).   Medications:  Infusions:  . heparin 950 Units/hr (05/15/18 0416)  . nitroGLYCERIN 5 mcg/min (05/15/18 0000)    Assessment: 44 yof presented to the ED with CP. She is on chronic Xarelto for history of DVT. She will transition to IV heparin for now. Her last dose of Xarelto was 6/20 at 1800. Will plan to use aPTT to guide dosing initially due to Xarelto influence on heparin levels.   aPTT is supratherapeutic at 122; heparin level 0.84 still affected by Xarelto. No bleeding noted, Hgb 9.3, platelets are normal. Plan is for cath Mon.  Goal of Therapy:  Heparin level 0.3-0.7 units/ml aPTT 66-102 seconds Monitor platelets by anticoagulation protocol: Yes   Plan:  Decrease heparin drip to 800 units/hr 8 hr aPTT Daily aPTT, heparin level and CBC Monitor for s/sx of bleeding   Renold Genta, PharmD, BCPS Clinical Pharmacist Clinical phone for 05/15/2018 until 3p is [phone] Please check AMION for all [place] numbers 05/15/2018 3:04 PM

## 2018-05-15 NOTE — Progress Notes (Signed)
[place] for Heparin (Xarelto on hold) Indication: history of DVT  No Known Allergies  Patient Measurements: Height: 5\' 3"  (160 cm) Weight: 165 lb 2 oz (74.9 kg) IBW/kg (Calculated) : 52.4 Heparin Dosing Weight: 66.9kg  Vital Signs: Temp: 97.7 F (36.5 C) (06/22 0354) Temp Source: Oral (06/22 0354) BP: 120/67 (06/22 0354) Pulse Rate: 68 (06/22 0354)  Labs: Recent Labs    05/13/18 2239 05/14/18 0816 05/14/18 1714 05/15/18 0232  HGB 10.3*  --   --  9.3*  HCT 33.9*  --   --  31.2*  PLT 212  --   --  250  APTT  --   --   --  145*  HEPARINUNFRC  --   --   --  1.08*  CREATININE 1.54* 1.40*  --  1.45*  TROPONINI  --  <0.03 <0.03  --     Estimated Creatinine Clearance: 39.5 mL/min (A) (by C-G formula based on SCr of 1.45 mg/dL (H)).   Medical History: Past Medical History:  Diagnosis Date  . Anemia   . Anxiety   . Arthritis    "right knee, back, feet, hands" (01/20/2018)  . Bleeding stomach ulcer 01/2016  . CAD (coronary artery disease)    05/19/17 PCI with DESx1 to [facility]  . Carotid artery disease ([place])    40-59% right and 1-39% left by doppler 05/2017  . Chronic lower back pain   . CKD (chronic kidney disease) stage 3, GFR 30-59 ml/min (HCC) 01/2016  . Diabetic peripheral neuropathy ([place])   . DVT (deep venous thrombosis) ([place])    bilateral diagnosed 01/2017  . GERD (gastroesophageal reflux disease)   . Gout   . Headache    "weekly" (2/27//2019)  . Heart murmur   . History of blood transfusion 2017   "related to stomach bleeding"  . Hyperlipidemia   . Hypertension   . NSTEMI (non-ST elevated myocardial infarction) ([place]) 05/2017  . PAD (peripheral artery disease) ([place])    a. ABIs 7/17: R 0.73, L 0.64, waveforms suggest bilateral SFA disease b. 07/30/16 balloon angioplasty to left SFA with atherectomy   . Pneumonia    "several times" (01/20/2018  . Type II diabetes mellitus (HCC)     Medications:  Infusions:  . heparin  1,100 Units/hr (05/15/18 0000)  . nitroGLYCERIN 5 mcg/min (05/15/18 0000)    Assessment: 2 yof presented to the ED with CP. She is on chronic xarelto for history of DVT. She will transition to IV heparin for now. Baseline Hgb is slightly low and platelets are WNL. No bleeding noted. Her last dose of xarelto was 6/20 at 1800. Will plan to use aPTT to guide dosing initially due to Xarelto influence on heparin levels.   6/22 AM update: aPTT is elevated, no issues per RN.   Goal of Therapy:  Heparin level 0.3-0.7 units/ml aPTT 66-102 seconds Monitor platelets by anticoagulation protocol: Yes   Plan:  Dec heparin to 950 units/hr 1300 aPTT/HL  Narda Bonds, PharmD, BCPS Clinical Pharmacist Phone: [phone]

## 2018-05-15 NOTE — Progress Notes (Signed)
ANTICOAGULATION CONSULT NOTE   Pharmacy Consult for Heparin (Xarelto on hold) Indication: history of DVT  No Known Allergies  Patient Measurements: Height: 5\' 3"  (160 cm) Weight: 158 lb 1.1 oz (71.7 kg) IBW/kg (Calculated) : 52.4 Heparin Dosing Weight: 66.9kg  Vital Signs: Temp: 97.7 F (36.5 C) (06/22 1959) Temp Source: Axillary (06/22 1959) BP: 114/51 (06/22 2134) Pulse Rate: 73 (06/22 2134)  Labs: Recent Labs    05/13/18 2239 05/14/18 0816 05/14/18 1714 05/15/18 0232 05/15/18 1239 05/15/18 2317  HGB 10.3*  --   --  9.3*  --   --   HCT 33.9*  --   --  31.2*  --   --   PLT 212  --   --  250  --   --   APTT  --   --   --  145* 122* 67*  HEPARINUNFRC  --   --   --  1.08* 0.84*  --   CREATININE 1.54* 1.40*  --  1.45*  --   --   TROPONINI  --  <0.03 <0.03  --   --   --     Estimated Creatinine Clearance: 38.7 mL/min (A) (by C-G formula based on SCr of 1.45 mg/dL (H)).   Medical History: Past Medical History:  Diagnosis Date  . Anemia   . Anxiety   . Arthritis    "right knee, back, feet, hands" (01/20/2018)  . Bleeding stomach ulcer 01/2016  . CAD (coronary artery disease)    05/19/17 PCI with DESx1 to [facility]  . Carotid artery disease ([place])    40-59% right and 1-39% left by doppler 05/2017  . Chronic lower back pain   . CKD (chronic kidney disease) stage 3, GFR 30-59 ml/min (HCC) 01/2016  . Diabetic peripheral neuropathy ([place])   . DVT (deep venous thrombosis) ([place])    bilateral diagnosed 01/2017  . GERD (gastroesophageal reflux disease)   . Gout   . Headache    "weekly" (2/27//2019)  . Heart murmur   . History of blood transfusion 2017   "related to stomach bleeding"  . Hyperlipidemia   . Hypertension   . NSTEMI (non-ST elevated myocardial infarction) ([place]) 05/2017  . PAD (peripheral artery disease) ([place])    a. ABIs 7/17: R 0.73, L 0.64, waveforms suggest bilateral SFA disease b. 07/30/16 balloon angioplasty to left SFA with atherectomy   . Pneumonia    "several times" (01/20/2018  . Type II diabetes mellitus (HCC)     Medications:  Infusions:  . heparin 800 Units/hr (05/15/18 2000)  . nitroGLYCERIN 5 mcg/min (05/15/18 2000)    Assessment: 44 yof presented to the ED with CP. She is on chronic xarelto for history of DVT. She will transition to IV heparin for now. Baseline Hgb is slightly low and platelets are WNL. No bleeding noted. Her last dose of xarelto was 6/20 at 1800. Will plan to use aPTT to guide dosing initially due to Xarelto influence on heparin levels.   6/22 AM update: aPTT is therapeutic x 1 after rate decrease, aPTT and heparin level were close to correlating earlier today, should be able to start using heparin level to dose soon  Goal of Therapy:  Heparin level 0.3-0.7 units/ml aPTT 66-102 seconds Monitor platelets by anticoagulation protocol: Yes   Plan:  Cont heparin at 800 units/hr Heparin level and aPTT with AM labs  Narda Bonds, PharmD, [place] Pharmacist Phone: [phone]

## 2018-05-16 ENCOUNTER — Inpatient Hospital Stay (HOSPITAL_COMMUNITY): Payer: Medicare Other

## 2018-05-16 DIAGNOSIS — R338 Other retention of urine: Secondary | ICD-10-CM | POA: Diagnosis not present

## 2018-05-16 DIAGNOSIS — N183 Chronic kidney disease, stage 3 (moderate): Secondary | ICD-10-CM | POA: Diagnosis not present

## 2018-05-16 DIAGNOSIS — I25110 Atherosclerotic heart disease of native coronary artery with unstable angina pectoris: Secondary | ICD-10-CM | POA: Diagnosis not present

## 2018-05-16 DIAGNOSIS — E871 Hypo-osmolality and hyponatremia: Secondary | ICD-10-CM | POA: Diagnosis not present

## 2018-05-16 DIAGNOSIS — I10 Essential (primary) hypertension: Secondary | ICD-10-CM | POA: Diagnosis not present

## 2018-05-16 DIAGNOSIS — I5032 Chronic diastolic (congestive) heart failure: Secondary | ICD-10-CM | POA: Diagnosis not present

## 2018-05-16 LAB — URINALYSIS, ROUTINE W REFLEX MICROSCOPIC
Bilirubin Urine: NEGATIVE
Glucose, UA: NEGATIVE mg/dL
HGB URINE DIPSTICK: NEGATIVE
Ketones, ur: NEGATIVE mg/dL
Leukocytes, UA: NEGATIVE
NITRITE: NEGATIVE
PH: 7 (ref 5.0–8.0)
Protein, ur: 30 mg/dL — AB
SPECIFIC GRAVITY, URINE: 1.003 — AB (ref 1.005–1.030)

## 2018-05-16 LAB — GLUCOSE, CAPILLARY
GLUCOSE-CAPILLARY: 172 mg/dL — AB (ref 65–99)
Glucose-Capillary: 138 mg/dL — ABNORMAL HIGH (ref 65–99)
Glucose-Capillary: 163 mg/dL — ABNORMAL HIGH (ref 65–99)
Glucose-Capillary: 170 mg/dL — ABNORMAL HIGH (ref 65–99)
Glucose-Capillary: 65 mg/dL (ref 65–99)
Glucose-Capillary: 74 mg/dL (ref 65–99)
Glucose-Capillary: 93 mg/dL (ref 65–99)

## 2018-05-16 LAB — CBC
HCT: 35 % — ABNORMAL LOW (ref 36.0–46.0)
HEMOGLOBIN: 10.3 g/dL — AB (ref 12.0–15.0)
MCH: 20.8 pg — ABNORMAL LOW (ref 26.0–34.0)
MCHC: 29.4 g/dL — ABNORMAL LOW (ref 30.0–36.0)
MCV: 70.7 fL — ABNORMAL LOW (ref 78.0–100.0)
Platelets: 264 10*3/uL (ref 150–400)
RBC: 4.95 MIL/uL (ref 3.87–5.11)
RDW: 16.7 % — ABNORMAL HIGH (ref 11.5–15.5)
WBC: 10.8 10*3/uL — ABNORMAL HIGH (ref 4.0–10.5)

## 2018-05-16 LAB — COMPREHENSIVE METABOLIC PANEL
ALBUMIN: 3.2 g/dL — AB (ref 3.5–5.0)
ALK PHOS: 104 U/L (ref 38–126)
ALT: 19 U/L (ref 14–54)
ANION GAP: 9 (ref 5–15)
AST: 31 U/L (ref 15–41)
BUN: 24 mg/dL — ABNORMAL HIGH (ref 6–20)
CALCIUM: 9.1 mg/dL (ref 8.9–10.3)
CO2: 20 mmol/L — AB (ref 22–32)
Chloride: 105 mmol/L (ref 101–111)
Creatinine, Ser: 1.93 mg/dL — ABNORMAL HIGH (ref 0.44–1.00)
GFR calc Af Amer: 31 mL/min — ABNORMAL LOW (ref >60)
GFR calc non Af Amer: 27 mL/min — ABNORMAL LOW (ref >60)
GLUCOSE: 69 mg/dL (ref 65–99)
Potassium: 4.8 mmol/L (ref 3.5–5.1)
SODIUM: 134 mmol/L — AB (ref 135–145)
Total Bilirubin: 0.7 mg/dL (ref 0.3–1.2)
Total Protein: 7 g/dL (ref 6.5–8.1)

## 2018-05-16 LAB — HEPARIN LEVEL (UNFRACTIONATED)
HEPARIN UNFRACTIONATED: 0.35 [IU]/mL (ref 0.30–0.70)
Heparin Unfractionated: 0.38 IU/mL (ref 0.30–0.70)

## 2018-05-16 LAB — APTT
APTT: 55 s — AB (ref 24–36)
aPTT: 61 seconds — ABNORMAL HIGH (ref 24–36)

## 2018-05-16 MED ORDER — SODIUM CHLORIDE 0.9 % WEIGHT BASED INFUSION
1.0000 mL/kg/h | INTRAVENOUS | Status: DC
  Administered 2018-05-16: 1 mL/kg/h via INTRAVENOUS

## 2018-05-16 MED ORDER — ASPIRIN 81 MG PO CHEW
81.0000 mg | CHEWABLE_TABLET | ORAL | Status: AC
  Administered 2018-05-17: 81 mg via ORAL
  Filled 2018-05-16: qty 1

## 2018-05-16 MED ORDER — SODIUM CHLORIDE 0.9% FLUSH
3.0000 mL | Freq: Two times a day (BID) | INTRAVENOUS | Status: DC
  Administered 2018-05-16 – 2018-05-17 (×2): 3 mL via INTRAVENOUS

## 2018-05-16 MED ORDER — MORPHINE SULFATE (PF) 2 MG/ML IV SOLN
1.0000 mg | INTRAVENOUS | Status: DC | PRN
  Administered 2018-05-16 – 2018-05-17 (×2): 1 mg via INTRAVENOUS
  Filled 2018-05-16 (×2): qty 1

## 2018-05-16 MED ORDER — MAGNESIUM CITRATE PO SOLN
1.0000 | Freq: Once | ORAL | Status: AC
  Administered 2018-05-16: 1 via ORAL
  Filled 2018-05-16: qty 296

## 2018-05-16 MED ORDER — FLEET ENEMA 7-19 GM/118ML RE ENEM
1.0000 | ENEMA | Freq: Every day | RECTAL | Status: DC | PRN
  Filled 2018-05-16: qty 1

## 2018-05-16 MED ORDER — METOPROLOL TARTRATE 50 MG PO TABS
50.0000 mg | ORAL_TABLET | Freq: Two times a day (BID) | ORAL | Status: DC
  Administered 2018-05-16 – 2018-05-17 (×2): 50 mg via ORAL
  Filled 2018-05-16 (×2): qty 1

## 2018-05-16 MED ORDER — CLONIDINE HCL 0.1 MG PO TABS
0.1000 mg | ORAL_TABLET | Freq: Two times a day (BID) | ORAL | Status: DC
  Administered 2018-05-17: 0.1 mg via ORAL
  Filled 2018-05-16: qty 1

## 2018-05-16 MED ORDER — SODIUM CHLORIDE 0.9 % IV SOLN: 250.0000 mL | INTRAVENOUS | Status: DC | PRN

## 2018-05-16 MED ORDER — INSULIN GLARGINE 100 UNIT/ML ~~LOC~~ SOLN
50.0000 [IU] | Freq: Every day | SUBCUTANEOUS | Status: DC
  Administered 2018-05-16: 50 [IU] via SUBCUTANEOUS
  Filled 2018-05-16 (×2): qty 0.5

## 2018-05-16 MED ORDER — SODIUM CHLORIDE 0.9% FLUSH: 3.0000 mL | INTRAVENOUS | Status: DC | PRN
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
## 2018-05-16 NOTE — Progress Notes (Addendum)
ANTICOAGULATION CONSULT NOTE   Pharmacy Consult for Heparin (Xarelto on hold) Indication: history of DVT  No Known Allergies  Patient Measurements: Height: 5\' 3"  (160 cm) Weight: 155 lb 9.6 oz (70.6 kg) IBW/kg (Calculated) : 52.4 Heparin Dosing Weight: 66.9kg  Vital Signs: Temp: 98 F (36.7 C) (06/23 1907) Temp Source: Oral (06/23 1907) BP: 144/63 (06/23 2114) Pulse Rate: 67 (06/23 2114)  Labs: Recent Labs    05/14/18 0816 05/14/18 1714  05/15/18 0232 05/15/18 1239 05/15/18 2317 05/16/18 0344 05/16/18 2139  HGB  --   --   --  9.3*  --   --  10.3*  --   HCT  --   --   --  31.2*  --   --  35.0*  --   PLT  --   --   --  250  --   --  264  --   APTT  --   --    < > 145* 122* 67* 55* 61*  HEPARINUNFRC  --   --    < > 1.08* 0.84*  --  0.35 0.38  CREATININE 1.40*  --   --  1.45*  --   --  1.93*  --   TROPONINI <0.03 <0.03  --   --   --   --   --   --    < > = values in this interval not displayed.    Estimated Creatinine Clearance: 28.8 mL/min (A) (by C-G formula based on SCr of 1.93 mg/dL (H)).   Medical History: Past Medical History:  Diagnosis Date  . Anemia   . Anxiety   . Arthritis    "right knee, back, feet, hands" (01/20/2018)  . Bleeding stomach ulcer 01/2016  . CAD (coronary artery disease)    05/19/17 PCI with DESx1 to [facility]  . Carotid artery disease ([place])    40-59% right and 1-39% left by doppler 05/2017  . Chronic lower back pain   . CKD (chronic kidney disease) stage 3, GFR 30-59 ml/min (HCC) 01/2016  . Diabetic peripheral neuropathy ([place])   . DVT (deep venous thrombosis) ([place])    bilateral diagnosed 01/2017  . GERD (gastroesophageal reflux disease)   . Gout   . Headache    "weekly" (2/27//2019)  . Heart murmur   . History of blood transfusion 2017   "related to stomach bleeding"  . Hyperlipidemia   . Hypertension   . NSTEMI (non-ST elevated myocardial infarction) ([place]) 05/2017  . PAD (peripheral artery disease) ([place])    a. ABIs 7/17: R 0.73, L  0.64, waveforms suggest bilateral SFA disease b. 07/30/16 balloon angioplasty to left SFA with atherectomy   . Pneumonia    "several times" (01/20/2018  . Type II diabetes mellitus (HCC)     Medications:  Infusions:  . sodium chloride    . sodium chloride 1 mL/kg/hr (05/16/18 2042)  . heparin 900 Units/hr (05/16/18 1906)  . nitroGLYCERIN 5 mcg/min (05/15/18 2000)    Assessment: 56 yof presented to the ED with CP. She is on chronic xarelto for history of DVT. She will transition to IV heparin for now. Baseline Hgb is slightly low and platelets are WNL. No bleeding noted. Her last dose of xarelto was 6/20 at 1800. Will plan to use aPTT to guide dosing initially due to Xarelto influence on heparin levels.   6/23 PM update: we can start using heparin level to dose, heparin level is therapeutic tonight  Goal of Therapy:  Heparin level  0.3-0.7 units/mL Monitor platelets by anticoagulation protocol: Yes   Plan:  Cont heparin at 900 units/hr Heparin level with AM labs  Narda Bonds, PharmD, BCPS Clinical Pharmacist Phone: [phone]  ============================== 05/17/2018 3:58 AM Addendum  Heparin level just below goal this AM -Inc heparin to 1050 units/hr -1200 HL Narda Bonds, PharmD, BCPS Clinical Pharmacist Phone: [phone] ===============================

## 2018-05-16 NOTE — Progress Notes (Signed)
[place] for Heparin (Xarelto on hold) Indication: history of DVT  No Known Allergies  Patient Measurements: Height: 5\' 3"  (160 cm) Weight: 155 lb 9.6 oz (70.6 kg) IBW/kg (Calculated) : 52.4 Heparin Dosing Weight: 66.9kg  Vital Signs: Temp: 98 F (36.7 C) (06/23 1108) Temp Source: Oral (06/23 1108) BP: 121/89 (06/23 1108) Pulse Rate: 67 (06/23 1108)  Labs: Recent Labs    05/13/18 2239 05/14/18 0816 05/14/18 1714  05/15/18 0232 05/15/18 1239 05/15/18 2317 05/16/18 0344  HGB 10.3*  --   --   --  9.3*  --   --  10.3*  HCT 33.9*  --   --   --  31.2*  --   --  35.0*  PLT 212  --   --   --  250  --   --  264  APTT  --   --   --    < > 145* 122* 67* 55*  HEPARINUNFRC  --   --   --   --  1.08* 0.84*  --  0.35  CREATININE 1.54* 1.40*  --   --  1.45*  --   --  1.93*  TROPONINI  --  <0.03 <0.03  --   --   --   --   --    < > = values in this interval not displayed.    Estimated Creatinine Clearance: 28.8 mL/min (A) (by C-G formula based on SCr of 1.93 mg/dL (H)).   Medical History: Past Medical History:  Diagnosis Date  . Anemia   . Anxiety   . Arthritis    "right knee, back, feet, hands" (01/20/2018)  . Bleeding stomach ulcer 01/2016  . CAD (coronary artery disease)    05/19/17 PCI with [facility]x1 to [facility]  . Carotid artery disease ([place])    40-59% right and 1-39% left by doppler 05/2017  . Chronic lower back pain   . CKD (chronic kidney disease) stage 3, GFR 30-59 ml/min (HCC) 01/2016  . Diabetic peripheral neuropathy ([place])   . DVT (deep venous thrombosis) ([place])    bilateral diagnosed 01/2017  . GERD (gastroesophageal reflux disease)   . Gout   . Headache    "weekly" (2/27//2019)  . Heart murmur   . History of blood transfusion 2017   "related to stomach bleeding"  . Hyperlipidemia   . Hypertension   . NSTEMI (non-ST elevated myocardial infarction) ([place]) 05/2017  . PAD (peripheral artery disease) ([place])    a. ABIs 7/17: R 0.73,  L 0.64, waveforms suggest bilateral SFA disease b. 07/30/16 balloon angioplasty to left SFA with atherectomy   . Pneumonia    "several times" (01/20/2018  . Type II diabetes mellitus (HCC)     Medications:  Infusions:  . heparin 800 Units/hr (05/15/18 2000)  . nitroGLYCERIN 5 mcg/min (05/15/18 2000)    Assessment: 67 yof presented to the ED with CP. She is on chronic xarelto for history of DVT. She will transition to IV heparin for now. Baseline Hgb is slightly low and platelets are WNL. No bleeding noted. Her last dose of xarelto was 6/20 at 1800. Will plan to use aPTT to guide dosing initially due to Xarelto influence on heparin levels.   PTT low this AM.  Heparin level on low side of goal range, but unclear if still falsely elevated by recent Xarelto.  Goal of Therapy:  Heparin level 0.3-0.7 units/ml aPTT 66-102 seconds Monitor platelets by anticoagulation protocol: Yes  Plan:  Increase IV Heparin to 900 units/hr. Recheck heparin level and PTT in 8 hrs. Daily heparin level and CBC. F/u plans for cath tomorrow.  Marguerite Olea, [facility] Clinical Pharmacist Phone [phone]  05/16/2018 1:15 PM

## 2018-05-17 ENCOUNTER — Encounter (HOSPITAL_COMMUNITY): Payer: Self-pay | Admitting: Cardiovascular Disease

## 2018-05-17 ENCOUNTER — Encounter (HOSPITAL_COMMUNITY)
Admission: EM | Disposition: A | Discharge status: Home / Self Care | Payer: Self-pay | Source: Home / Self Care | Attending: Internal Medicine

## 2018-05-17 DIAGNOSIS — I5032 Chronic diastolic (congestive) heart failure: Secondary | ICD-10-CM | POA: Diagnosis not present

## 2018-05-17 DIAGNOSIS — Z86718 Personal history of other venous thrombosis and embolism: Secondary | ICD-10-CM | POA: Diagnosis not present

## 2018-05-17 DIAGNOSIS — R338 Other retention of urine: Secondary | ICD-10-CM | POA: Diagnosis not present

## 2018-05-17 DIAGNOSIS — I25110 Atherosclerotic heart disease of native coronary artery with unstable angina pectoris: Secondary | ICD-10-CM | POA: Diagnosis not present

## 2018-05-17 DIAGNOSIS — E118 Type 2 diabetes mellitus with unspecified complications: Secondary | ICD-10-CM | POA: Diagnosis not present

## 2018-05-17 DIAGNOSIS — R042 Hemoptysis: Secondary | ICD-10-CM | POA: Diagnosis not present

## 2018-05-17 DIAGNOSIS — E871 Hypo-osmolality and hyponatremia: Secondary | ICD-10-CM | POA: Diagnosis not present

## 2018-05-17 DIAGNOSIS — N183 Chronic kidney disease, stage 3 (moderate): Secondary | ICD-10-CM | POA: Diagnosis not present

## 2018-05-17 DIAGNOSIS — Z794 Long term (current) use of insulin: Secondary | ICD-10-CM | POA: Diagnosis not present

## 2018-05-17 HISTORY — PX: LEFT HEART CATH AND CORONARY ANGIOGRAPHY: CATH118249

## 2018-05-17 LAB — BASIC METABOLIC PANEL
Anion gap: 9 (ref 5–15)
BUN: 21 mg/dL — ABNORMAL HIGH (ref 6–20)
CALCIUM: 9 mg/dL (ref 8.9–10.3)
CHLORIDE: 102 mmol/L (ref 101–111)
CO2: 25 mmol/L (ref 22–32)
CREATININE: 1.49 mg/dL — AB (ref 0.44–1.00)
GFR calc Af Amer: 43 mL/min — ABNORMAL LOW (ref >60)
GFR calc non Af Amer: 37 mL/min — ABNORMAL LOW (ref >60)
GLUCOSE: 193 mg/dL — AB (ref 65–99)
Potassium: 4.7 mmol/L (ref 3.5–5.1)
Sodium: 136 mmol/L (ref 135–145)

## 2018-05-17 LAB — CBC
HEMATOCRIT: 33.7 % — AB (ref 36.0–46.0)
HEMOGLOBIN: 9.8 g/dL — AB (ref 12.0–15.0)
MCH: 20.6 pg — ABNORMAL LOW (ref 26.0–34.0)
MCHC: 29.1 g/dL — AB (ref 30.0–36.0)
MCV: 70.8 fL — AB (ref 78.0–100.0)
Platelets: 263 10*3/uL (ref 150–400)
RBC: 4.76 MIL/uL (ref 3.87–5.11)
RDW: 16.6 % — AB (ref 11.5–15.5)
WBC: 10.2 10*3/uL (ref 4.0–10.5)

## 2018-05-17 LAB — GLUCOSE, CAPILLARY
GLUCOSE-CAPILLARY: 109 mg/dL — AB (ref 65–99)
GLUCOSE-CAPILLARY: 96 mg/dL (ref 65–99)
Glucose-Capillary: 182 mg/dL — ABNORMAL HIGH (ref 65–99)
Glucose-Capillary: 80 mg/dL (ref 65–99)

## 2018-05-17 LAB — POCT ACTIVATED CLOTTING TIME: Activated Clotting Time: 158 seconds

## 2018-05-17 LAB — HEPARIN LEVEL (UNFRACTIONATED): HEPARIN UNFRACTIONATED: 0.28 [IU]/mL — AB (ref 0.30–0.70)

## 2018-05-17 SURGERY — LEFT HEART CATH AND CORONARY ANGIOGRAPHY
Anesthesia: LOCAL

## 2018-05-17 MED ORDER — SODIUM CHLORIDE 0.9 % IV SOLN: INTRAVENOUS | Status: AC

## 2018-05-17 MED ORDER — IOPAMIDOL (ISOVUE-370) INJECTION 76%
INTRAVENOUS | Status: AC
  Filled 2018-05-17: qty 100

## 2018-05-17 MED ORDER — LIDOCAINE HCL (PF) 1 % IJ SOLN
INTRAMUSCULAR | Status: AC
  Filled 2018-05-17: qty 30

## 2018-05-17 MED ORDER — FENTANYL CITRATE (PF) 100 MCG/2ML IJ SOLN
INTRAMUSCULAR | Status: AC
  Filled 2018-05-17: qty 2

## 2018-05-17 MED ORDER — ATORVASTATIN CALCIUM 80 MG PO TABS: 80.0000 mg | ORAL_TABLET | Freq: Every day | ORAL | Status: DC

## 2018-05-17 MED ORDER — BETHANECHOL CHLORIDE 10 MG PO TABS
10.0000 mg | ORAL_TABLET | Freq: Three times a day (TID) | ORAL | Status: DC
  Administered 2018-05-17 (×2): 10 mg via ORAL
  Filled 2018-05-17 (×3): qty 1

## 2018-05-17 MED ORDER — BETHANECHOL CHLORIDE 10 MG PO TABS: 10.0000 mg | ORAL_TABLET | Freq: Three times a day (TID) | ORAL | 0 refills | Status: AC

## 2018-05-17 MED ORDER — HEPARIN (PORCINE) IN NACL 1000-0.9 UT/500ML-% IV SOLN
INTRAVENOUS | Status: AC
  Filled 2018-05-17: qty 1000

## 2018-05-17 MED ORDER — SODIUM CHLORIDE 0.9 % IV SOLN: 250.0000 mL | INTRAVENOUS | Status: DC | PRN

## 2018-05-17 MED ORDER — MIDAZOLAM HCL 2 MG/2ML IJ SOLN
INTRAMUSCULAR | Status: AC
  Filled 2018-05-17: qty 2

## 2018-05-17 MED ORDER — RIVAROXABAN 20 MG PO TABS: 20.0000 mg | ORAL_TABLET | Freq: Every day | ORAL | Status: DC

## 2018-05-17 MED ORDER — ASPIRIN 81 MG PO CHEW: 81.0000 mg | CHEWABLE_TABLET | Freq: Every day | ORAL | 3 refills | Status: DC

## 2018-05-17 MED ORDER — LIDOCAINE HCL (PF) 1 % IJ SOLN
INTRAMUSCULAR | Status: DC | PRN
  Administered 2018-05-17: 15 mL

## 2018-05-17 MED ORDER — METOPROLOL TARTRATE 50 MG PO TABS: 50.0000 mg | ORAL_TABLET | Freq: Two times a day (BID) | ORAL | 3 refills | Status: DC

## 2018-05-17 MED ORDER — ASPIRIN 81 MG PO CHEW: 81.0000 mg | CHEWABLE_TABLET | Freq: Every day | ORAL | Status: DC

## 2018-05-17 MED ORDER — ACETAMINOPHEN 325 MG PO TABS: 650.0000 mg | ORAL_TABLET | ORAL | Status: DC | PRN

## 2018-05-17 MED ORDER — MORPHINE SULFATE (PF) 2 MG/ML IV SOLN: 2.0000 mg | INTRAVENOUS | Status: DC | PRN

## 2018-05-17 MED ORDER — SODIUM CHLORIDE 0.9% FLUSH: 3.0000 mL | INTRAVENOUS | Status: DC | PRN

## 2018-05-17 MED ORDER — MIDAZOLAM HCL 2 MG/2ML IJ SOLN
INTRAMUSCULAR | Status: DC | PRN
  Administered 2018-05-17: 1 mg via INTRAVENOUS

## 2018-05-17 MED ORDER — HEPARIN (PORCINE) IN NACL 2-0.9 UNITS/ML
INTRAMUSCULAR | Status: AC | PRN
  Administered 2018-05-17: 1000 mL via INTRA_ARTERIAL

## 2018-05-17 MED ORDER — CLONIDINE HCL 0.1 MG PO TABS: 0.1000 mg | ORAL_TABLET | Freq: Two times a day (BID) | ORAL | 0 refills | Status: DC

## 2018-05-17 MED ORDER — IOPAMIDOL (ISOVUE-370) INJECTION 76%
INTRAVENOUS | Status: DC | PRN
  Administered 2018-05-17: 30 mL via INTRA_ARTERIAL

## 2018-05-17 MED ORDER — SODIUM CHLORIDE 0.9% FLUSH: 3.0000 mL | Freq: Two times a day (BID) | INTRAVENOUS | Status: DC

## 2018-05-17 MED ORDER — ONDANSETRON HCL 4 MG/2ML IJ SOLN
4.0000 mg | Freq: Four times a day (QID) | INTRAMUSCULAR | Status: DC | PRN
Start: 2018-05-17 — End: 2018-05-17

## 2018-05-17 MED ORDER — FENTANYL CITRATE (PF) 100 MCG/2ML IJ SOLN
INTRAMUSCULAR | Status: DC | PRN
  Administered 2018-05-17: 25 ug via INTRAVENOUS

## 2018-05-17 SURGICAL SUPPLY — 6 items
CATH INFINITI 5FR MULTPACK ANG (CATHETERS) ×2 IMPLANT
KIT HEART LEFT (KITS) ×2 IMPLANT
PACK CARDIAC CATHETERIZATION (CUSTOM PROCEDURE TRAY) ×2 IMPLANT
SHEATH PINNACLE 5F 10CM (SHEATH) ×2 IMPLANT
TRANSDUCER W/STOPCOCK (MISCELLANEOUS) ×2 IMPLANT
WIRE EMERALD 3MM-J .035X150CM (WIRE) ×2 IMPLANT

## 2018-05-17 NOTE — Progress Notes (Signed)
Patient educated on AVS and medication administration.  Prescriptions given.  Instructed on follow up appointments and limitations upon discharge.  All belongings taken with patient at this time.  No s/s of distress or discomfort noted.

## 2018-05-17 NOTE — Progress Notes (Signed)
Patient taken to cath lab on portable monitor.  No s/s of distress noted.  Denies pain.  Heparin drip stopped.  NS continues.
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
## 2018-05-17 NOTE — Progress Notes (Addendum)
Site area: Right groin a 5 french arterial sheath was removed  Site Prior to Removal:  Level 0  Pressure Applied For 20 MINUTES    Bedrest Beginning at 1145am  Manual:   Yes.    Patient Status During Pull:  stable  Post Pull Groin Site:  Level 0  Post Pull Instructions Given:  Yes.    Post Pull Pulses Present:  Yes.    Dressing Applied:  Yes.    Comments:  VS remain stable

## 2018-05-17 NOTE — Progress Notes (Signed)
Patient taken to personal vehicle where family was waiting at this time by CNA.  All belongings taken.

## 2018-05-17 NOTE — Plan of Care (Signed)
  Problem: Education: Goal: Knowledge of General Education information will improve Outcome: Adequate for Discharge   Problem: Health Behavior/Discharge Planning: Goal: Ability to manage health-related needs will improve Outcome: Adequate for Discharge   Problem: Clinical Measurements: Goal: Ability to maintain clinical measurements within normal limits will improve Outcome: Adequate for Discharge Goal: Will remain free from infection Outcome: Adequate for Discharge Goal: Diagnostic test results will improve Outcome: Adequate for Discharge Goal: Respiratory complications will improve Outcome: Adequate for Discharge Goal: Cardiovascular complication will be avoided Outcome: Adequate for Discharge   Problem: Activity: Goal: Risk for activity intolerance will decrease Outcome: Adequate for Discharge   Problem: Nutrition: Goal: Adequate nutrition will be maintained Outcome: Adequate for Discharge   Problem: Coping: Goal: Level of anxiety will decrease Outcome: Adequate for Discharge   Problem: Elimination: Goal: Will not experience complications related to bowel motility Outcome: Adequate for Discharge Goal: Will not experience complications related to urinary retention Outcome: Adequate for Discharge   Problem: Pain Managment: Goal: General experience of comfort will improve Outcome: Adequate for Discharge   Problem: Skin Integrity: Goal: Risk for impaired skin integrity will decrease Outcome: Adequate for Discharge   Problem: Education: Goal: Understanding of CV disease, CV risk reduction, and recovery process will improve Outcome: Adequate for Discharge   Problem: Activity: Goal: Ability to return to baseline activity level will improve Outcome: Adequate for Discharge   Problem: Cardiovascular: Goal: Ability to achieve and maintain adequate cardiovascular perfusion will improve Outcome: Adequate for Discharge Goal: Vascular access site(s) Level 0-1 will be  maintained Outcome: Adequate for Discharge   Problem: Health Behavior/Discharge Planning: Goal: Ability to safely manage health-related needs after discharge will improve Outcome: Adequate for Discharge

## 2018-05-17 NOTE — Interval H&P Note (Signed)
Cath Lab Visit (complete for each Cath Lab visit)  Clinical Evaluation Leading to the Procedure:   ACS: Yes.    Non-ACS:    Anginal Classification: CCS III  Anti-ischemic medical therapy: Maximal Therapy (2 or more classes of medications)  Non-Invasive Test Results: No non-invasive testing performed  Prior CABG: No previous CABG      History and Physical Interval Note:  05/17/2018 10:24 AM  Alejandra Hernandez  has presented today for surgery, with the diagnosis of ua  The various methods of treatment have been discussed with the patient and family. After consideration of risks, benefits and other options for treatment, the patient has consented to  Procedure(s): LEFT HEART CATH AND CORONARY ANGIOGRAPHY (N/A) as a surgical intervention .  The patient's history has been reviewed, patient examined, no change in status, stable for surgery.  I have reviewed the patient's chart and labs.  Questions were answered to the patient's satisfaction.     Quay Burow
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
## 2018-05-17 NOTE — Care Management Note (Signed)
Case Management Note  Patient Details  Name: Alejandra Hernandez MRN: [id number] Date of Birth: Mar 22, 1956  Subjective/Objective:  Pt admitted with CP                  Action/Plan:   PTA from home independent with son - pt has both walker and cane in the home and uses when necessary.  Pt has PCP and denied barriers to paying for medications.     Expected Discharge Date:  05/17/18               Expected Discharge Plan:  Home/Self Care  In-House Referral:     Discharge planning Services  CM Consult  Post Acute Care Choice:    Choice offered to:     DME Arranged:    DME Agency:     HH Arranged:    HH Agency:     Status of Service:  In process, will continue to follow  If discussed at Long Length of Stay Meetings, dates discussed:    Additional Comments:  Maryclare Labrador, RN 05/17/2018, 2:04 PM

## 2018-05-17 NOTE — Progress Notes (Signed)
Patient returned to room at this time from cath lab.  Right groin dressing clean dry and intact no drainage noted.  No bruising or hematoma noted.  VSS.

## 2018-05-19 ENCOUNTER — Other Ambulatory Visit: Payer: Self-pay

## 2018-05-19 MED ORDER — POTASSIUM CHLORIDE CRYS ER 20 MEQ PO TBCR: 20.0000 meq | EXTENDED_RELEASE_TABLET | Freq: Every day | ORAL | 1 refills | Status: DC

## 2018-05-23 DIAGNOSIS — E119 Type 2 diabetes mellitus without complications: Secondary | ICD-10-CM | POA: Insufficient documentation

## 2018-05-23 DIAGNOSIS — E109 Type 1 diabetes mellitus without complications: Secondary | ICD-10-CM

## 2018-05-23 DIAGNOSIS — IMO0002 Reserved for concepts with insufficient information to code with codable children: Secondary | ICD-10-CM | POA: Insufficient documentation

## 2018-06-02 ENCOUNTER — Ambulatory Visit (HOSPITAL_COMMUNITY)
Admission: RE | Admit: 2018-06-02 | Discharge: 2018-06-02 | Disposition: A | Discharge status: Home / Self Care | Payer: Medicare Other | Source: Ambulatory Visit | Attending: Cardiology | Admitting: Cardiology

## 2018-06-02 ENCOUNTER — Encounter: Payer: Self-pay | Admitting: Cardiology

## 2018-06-02 DIAGNOSIS — I739 Peripheral vascular disease, unspecified: Secondary | ICD-10-CM

## 2018-06-02 DIAGNOSIS — I779 Disorder of arteries and arterioles, unspecified: Secondary | ICD-10-CM | POA: Diagnosis not present

## 2018-06-04 ENCOUNTER — Other Ambulatory Visit: Payer: Self-pay | Admitting: Cardiology

## 2018-06-07 ENCOUNTER — Other Ambulatory Visit: Payer: Self-pay | Admitting: *Deleted

## 2018-06-08 ENCOUNTER — Ambulatory Visit (INDEPENDENT_AMBULATORY_CARE_PROVIDER_SITE_OTHER): Payer: Medicare Other | Admitting: Cardiology

## 2018-06-08 ENCOUNTER — Encounter: Payer: Self-pay | Admitting: Cardiology

## 2018-06-08 VITALS — BP 190/40 | HR 77 | Ht 63.0 in | Wt 157.1 lb

## 2018-06-08 DIAGNOSIS — I150 Renovascular hypertension: Secondary | ICD-10-CM | POA: Diagnosis not present

## 2018-06-08 DIAGNOSIS — I2510 Atherosclerotic heart disease of native coronary artery without angina pectoris: Secondary | ICD-10-CM

## 2018-06-08 MED ORDER — RIVAROXABAN 20 MG PO TABS: 20.0000 mg | ORAL_TABLET | Freq: Every day | ORAL | 11 refills | Status: DC

## 2018-06-08 MED ORDER — METOPROLOL TARTRATE 100 MG PO TABS: 100.0000 mg | ORAL_TABLET | Freq: Two times a day (BID) | ORAL | 11 refills | Status: DC
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
## 2018-06-08 NOTE — Patient Instructions (Addendum)
Your physician has recommended you make the following change in your medication:   INCREASE METOPROLOL TO 100 MG TWICE DAILY     Your physician recommends that you schedule a follow-up appointment in:  AS SCHEDULED

## 2018-06-11 ENCOUNTER — Telehealth: Payer: Self-pay | Admitting: *Deleted

## 2018-06-11 DIAGNOSIS — I340 Nonrheumatic mitral (valve) insufficiency: Secondary | ICD-10-CM

## 2018-06-11 NOTE — Telephone Encounter (Signed)
Pt aware of the need for repeat echo per Lyda Jester PA  Dx MR .Adonis Housekeeper

## 2018-06-11 NOTE — Telephone Encounter (Signed)
-----   Message from Richmond Campbell, LPN sent at 05/24/7792  4:26 PM EDT ----- PT NEEDS AN ECHO TO F/U ON MR . CALL PT

## 2018-06-15 ENCOUNTER — Ambulatory Visit (INDEPENDENT_AMBULATORY_CARE_PROVIDER_SITE_OTHER): Payer: Medicare Other | Admitting: Cardiovascular Disease

## 2018-06-15 ENCOUNTER — Encounter: Payer: Self-pay | Admitting: Cardiovascular Disease

## 2018-06-15 VITALS — BP 148/78 | HR 69 | Ht 66.0 in | Wt 167.8 lb

## 2018-06-15 DIAGNOSIS — I739 Peripheral vascular disease, unspecified: Secondary | ICD-10-CM | POA: Diagnosis not present

## 2018-06-15 DIAGNOSIS — I150 Renovascular hypertension: Secondary | ICD-10-CM | POA: Diagnosis not present

## 2018-06-15 DIAGNOSIS — I10 Essential (primary) hypertension: Secondary | ICD-10-CM | POA: Diagnosis not present

## 2018-06-15 DIAGNOSIS — E785 Hyperlipidemia, unspecified: Secondary | ICD-10-CM

## 2018-06-15 DIAGNOSIS — I779 Disorder of arteries and arterioles, unspecified: Secondary | ICD-10-CM

## 2018-06-15 MED ORDER — CARVEDILOL 12.5 MG PO TABS: 12.5000 mg | ORAL_TABLET | Freq: Two times a day (BID) | ORAL | 0 refills | Status: DC
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
## 2018-06-15 NOTE — Patient Instructions (Addendum)
Medication Instructions: Stop the Metoprolol Start Carvedilol 12.5 mg twice daily  If you need a refill on your cardiac medications before your next appointment, please call your pharmacy.   Procedures/Testing: Your physician has requested that you have a renal artery duplex. During this test, an ultrasound is used to evaluate blood flow to the kidneys. Allow one hour for this exam. Do not eat after midnight the day before and avoid carbonated beverages. Take your medications as you usually do. This will take place at [place], [address].   Follow-Up: Your physician wants you to keep your Septemeber 17th appointment with Dr. Fletcher Anon.    Thank you for choosing Heartcare at [facility]!!

## 2018-06-21 ENCOUNTER — Encounter: Payer: Self-pay | Admitting: Sports Medicine

## 2018-06-22 ENCOUNTER — Telehealth: Payer: Self-pay | Admitting: Cardiovascular Disease

## 2018-06-22 NOTE — Telephone Encounter (Signed)
Received records from [facility] on 06/22/18, Appt 08/10/18 @ 9:20AM. [place]

## 2018-06-23 ENCOUNTER — Encounter: Payer: Self-pay | Admitting: Endocrinology

## 2018-06-23 ENCOUNTER — Ambulatory Visit (INDEPENDENT_AMBULATORY_CARE_PROVIDER_SITE_OTHER): Payer: Medicare Other | Admitting: Endocrinology

## 2018-06-23 VITALS — BP 140/60 | HR 68 | Temp 97.8°F | Ht 66.0 in | Wt 160.4 lb

## 2018-06-23 DIAGNOSIS — Z794 Long term (current) use of insulin: Secondary | ICD-10-CM

## 2018-06-23 DIAGNOSIS — E118 Type 2 diabetes mellitus with unspecified complications: Secondary | ICD-10-CM | POA: Diagnosis not present

## 2018-06-23 LAB — POCT GLYCOSYLATED HEMOGLOBIN (HGB A1C): HEMOGLOBIN A1C: 10.1 % — AB (ref 4.0–5.6)

## 2018-06-23 MED ORDER — INSULIN GLARGINE 100 UNIT/ML SOLOSTAR PEN: 130.0000 [IU] | PEN_INJECTOR | SUBCUTANEOUS | 99 refills | Status: DC
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
## 2018-06-23 NOTE — Patient Instructions (Addendum)
check your blood sugar twice a day.  vary the time of day when you check, between before the 3 meals, and at bedtime.  also check if you have symptoms of your blood sugar being too high or too low.  please keep a record of the readings and bring it to your next appointment here (or you can bring the meter itself).  You can write it on any piece of paper.  please call us sooner if your blood sugar goes below 70, or if you have a lot of readings over 200.  For now, please: Increase the lantus to 130 units in the morning (and none in the evening), and:  Please continue the same humalog.  On this type of insulin schedule, you should eat meals on a regular schedule.  If a meal is missed or significantly delayed, your blood sugar could go low.   Please come back for a follow-up appointment in 2 months.

## 2018-06-24 ENCOUNTER — Other Ambulatory Visit (HOSPITAL_COMMUNITY): Payer: Medicare Other

## 2018-06-24 ENCOUNTER — Ambulatory Visit (HOSPITAL_COMMUNITY)
Admission: RE | Admit: 2018-06-24 | Discharge: 2018-06-24 | Disposition: A | Discharge status: Home / Self Care | Payer: Medicare Other | Source: Ambulatory Visit | Attending: Cardiology | Admitting: Cardiology

## 2018-06-24 DIAGNOSIS — I10 Essential (primary) hypertension: Secondary | ICD-10-CM

## 2018-06-28 ENCOUNTER — Other Ambulatory Visit: Payer: Self-pay | Admitting: Internal Medicine

## 2018-06-28 DIAGNOSIS — Z1231 Encounter for screening mammogram for malignant neoplasm of breast: Secondary | ICD-10-CM

## 2018-07-01 ENCOUNTER — Other Ambulatory Visit: Payer: Self-pay

## 2018-07-01 ENCOUNTER — Ambulatory Visit (HOSPITAL_COMMUNITY): Payer: Medicare Other | Attending: Cardiovascular Disease

## 2018-07-01 DIAGNOSIS — I2510 Atherosclerotic heart disease of native coronary artery without angina pectoris: Secondary | ICD-10-CM | POA: Insufficient documentation

## 2018-07-01 DIAGNOSIS — I129 Hypertensive chronic kidney disease with stage 1 through stage 4 chronic kidney disease, or unspecified chronic kidney disease: Secondary | ICD-10-CM | POA: Insufficient documentation

## 2018-07-01 DIAGNOSIS — E1122 Type 2 diabetes mellitus with diabetic chronic kidney disease: Secondary | ICD-10-CM | POA: Insufficient documentation

## 2018-07-01 DIAGNOSIS — G8929 Other chronic pain: Secondary | ICD-10-CM | POA: Insufficient documentation

## 2018-07-01 DIAGNOSIS — M545 Low back pain: Secondary | ICD-10-CM | POA: Insufficient documentation

## 2018-07-01 DIAGNOSIS — I252 Old myocardial infarction: Secondary | ICD-10-CM | POA: Diagnosis not present

## 2018-07-01 DIAGNOSIS — I340 Nonrheumatic mitral (valve) insufficiency: Secondary | ICD-10-CM | POA: Diagnosis present

## 2018-07-01 DIAGNOSIS — N189 Chronic kidney disease, unspecified: Secondary | ICD-10-CM | POA: Diagnosis not present

## 2018-07-01 DIAGNOSIS — E785 Hyperlipidemia, unspecified: Secondary | ICD-10-CM | POA: Diagnosis not present

## 2018-07-12 ENCOUNTER — Telehealth: Payer: Self-pay

## 2018-07-12 NOTE — Telephone Encounter (Signed)
   [facility] Pre-operative Risk Assessment    Request for surgical clearance:  1. What type of surgery is being performed? Cataract Extraction    2. When is this surgery scheduled? 07/23/18   3. What type of clearance is required (medical clearance vs. Pharmacy clearance to hold med vs. Both)? Medical  4. Are there any medications that need to be held prior to surgery and how long? No medications needed to be held   5. Practice name and name of physician performing surgery? [facility], Dr. Julian Reil    6. What is your office phone number: [phone]    7.   What is your office fax number: [phone]  8.   Anesthesia type (None, local, MAC, general) ? IV sedation   Sarina Ill 07/12/2018, 4:41 PM  _________________________________________________________________   (provider comments below)

## 2018-07-13 NOTE — Telephone Encounter (Signed)
   Primary Cardiologist: Fransico Him, MD  Chart reviewed as part of pre-operative protocol coverage. Given past medical history and time since last visit, based on ACC/AHA guidelines, Alejandra Hernandez would be at acceptable risk for the planned procedure without further cardiovascular testing.   I will route this recommendation to the requesting party via Epic fax function and remove from pre-op pool.  Please call with questions.  Truitt Merle, NP 07/13/2018, 3:10 PM

## 2018-07-20 ENCOUNTER — Other Ambulatory Visit: Payer: Self-pay | Admitting: Cardiovascular Disease

## 2018-07-20 DIAGNOSIS — I739 Peripheral vascular disease, unspecified: Secondary | ICD-10-CM

## 2018-07-20 DIAGNOSIS — Z9862 Peripheral vascular angioplasty status: Secondary | ICD-10-CM

## 2018-07-28 ENCOUNTER — Other Ambulatory Visit: Payer: Self-pay | Admitting: Cardiology

## 2018-07-28 ENCOUNTER — Ambulatory Visit
Admission: RE | Admit: 2018-07-28 | Discharge: 2018-07-28 | Disposition: A | Discharge status: Home / Self Care | Payer: Medicare Other | Source: Ambulatory Visit | Attending: Internal Medicine | Admitting: Internal Medicine

## 2018-07-28 DIAGNOSIS — Z1231 Encounter for screening mammogram for malignant neoplasm of breast: Secondary | ICD-10-CM

## 2018-08-10 ENCOUNTER — Encounter: Payer: Self-pay | Admitting: Cardiovascular Disease

## 2018-08-10 ENCOUNTER — Ambulatory Visit (INDEPENDENT_AMBULATORY_CARE_PROVIDER_SITE_OTHER): Payer: Medicare Other | Admitting: Cardiovascular Disease

## 2018-08-10 VITALS — BP 124/64 | HR 66 | Ht 63.0 in | Wt 159.6 lb

## 2018-08-10 DIAGNOSIS — I25118 Atherosclerotic heart disease of native coronary artery with other forms of angina pectoris: Secondary | ICD-10-CM | POA: Diagnosis not present

## 2018-08-10 DIAGNOSIS — E785 Hyperlipidemia, unspecified: Secondary | ICD-10-CM | POA: Diagnosis not present

## 2018-08-10 DIAGNOSIS — Z720 Tobacco use: Secondary | ICD-10-CM | POA: Diagnosis not present

## 2018-08-10 DIAGNOSIS — I779 Disorder of arteries and arterioles, unspecified: Secondary | ICD-10-CM

## 2018-08-10 DIAGNOSIS — I739 Peripheral vascular disease, unspecified: Secondary | ICD-10-CM

## 2018-08-10 DIAGNOSIS — I10 Essential (primary) hypertension: Secondary | ICD-10-CM

## 2018-08-10 NOTE — Patient Instructions (Signed)
Medication Instructions: Your physician recommends that you continue on your current medications as directed. Please refer to the Current Medication list given to you today.  If you need a refill on your cardiac medications before your next appointment, please call your pharmacy.   Follow-Up: Your physician wants you to follow-up in 3 months with Dr. Fletcher Anon.   Thank you for choosing Heartcare at [facility]!!
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
## 2018-08-18 ENCOUNTER — Ambulatory Visit (HOSPITAL_COMMUNITY)
Admission: RE | Admit: 2018-08-18 | Discharge: 2018-08-18 | Disposition: A | Discharge status: Home / Self Care | Payer: Medicare Other | Source: Ambulatory Visit | Attending: Cardiovascular Disease | Admitting: Cardiovascular Disease

## 2018-08-18 DIAGNOSIS — Z9862 Peripheral vascular angioplasty status: Secondary | ICD-10-CM | POA: Diagnosis present

## 2018-08-18 DIAGNOSIS — I739 Peripheral vascular disease, unspecified: Secondary | ICD-10-CM | POA: Diagnosis not present

## 2018-08-24 ENCOUNTER — Telehealth: Payer: Self-pay | Admitting: *Deleted

## 2018-08-24 ENCOUNTER — Ambulatory Visit (INDEPENDENT_AMBULATORY_CARE_PROVIDER_SITE_OTHER): Payer: Medicare Other | Admitting: Endocrinology

## 2018-08-24 ENCOUNTER — Encounter: Payer: Self-pay | Admitting: Endocrinology

## 2018-08-24 VITALS — BP 128/70 | HR 62 | Ht 63.0 in | Wt 159.8 lb

## 2018-08-24 DIAGNOSIS — Z794 Long term (current) use of insulin: Secondary | ICD-10-CM | POA: Diagnosis not present

## 2018-08-24 DIAGNOSIS — E118 Type 2 diabetes mellitus with unspecified complications: Secondary | ICD-10-CM

## 2018-08-24 LAB — POCT GLYCOSYLATED HEMOGLOBIN (HGB A1C): Hemoglobin A1C: 11.9 % — AB (ref 4.0–5.6)

## 2018-08-24 MED ORDER — INSULIN GLARGINE 100 UNIT/ML SOLOSTAR PEN: 140.0000 [IU] | PEN_INJECTOR | SUBCUTANEOUS | 99 refills | Status: DC

## 2018-08-24 NOTE — Patient Instructions (Addendum)
check your blood sugar twice a day.  vary the time of day when you check, between before the 3 meals, and at bedtime.  also check if you have symptoms of your blood sugar being too high or too low.  please keep a record of the readings and bring it to your next appointment here (or you can bring the meter itself).  You can write it on any piece of paper.  please call us sooner if your blood sugar goes below 70, or if you have a lot of readings over 200.  For now, please: Increase the lantus to 140 units in the morning (and none in the evening), and:  On this type of insulin schedule, you should eat meals on a regular schedule.  If a meal is missed or significantly delayed, your blood sugar could go low.  You may need to eat a light snack at bedtime, to avoid the blood sugar from going low in the middle of the night Please come back for a follow-up appointment in 2 months.

## 2018-08-24 NOTE — Telephone Encounter (Signed)
Left a message for the patient to call back.  

## 2018-08-24 NOTE — Telephone Encounter (Signed)
-----   Message from Wellington Hampshire, MD sent at 08/23/2018  6:21 PM EDT ----- ABI was stable and not different from the most recent study.  The areas I worked on look good overall.  She should keep her follow-up with me in December to reevaluate her leg pain.
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
## 2018-08-25 NOTE — Telephone Encounter (Signed)
I spoke with the patient. She is aware of her results.

## 2018-08-25 NOTE — Telephone Encounter (Signed)
Patient returning our call ° °Please call back ° °

## 2018-09-01 ENCOUNTER — Other Ambulatory Visit: Payer: Self-pay | Admitting: Cardiology

## 2018-09-15 ENCOUNTER — Other Ambulatory Visit: Payer: Self-pay | Admitting: *Deleted

## 2018-09-15 MED ORDER — CARVEDILOL 12.5 MG PO TABS
12.5000 mg | ORAL_TABLET | Freq: Two times a day (BID) | ORAL | 0 refills | Status: DC
Start: 2018-09-15 — End: 2018-11-09

## 2018-09-28 ENCOUNTER — Ambulatory Visit: Payer: Medicare Other | Admitting: Sports Medicine

## 2018-10-08 ENCOUNTER — Ambulatory Visit (INDEPENDENT_AMBULATORY_CARE_PROVIDER_SITE_OTHER): Payer: Medicare Other

## 2018-10-08 ENCOUNTER — Encounter (HOSPITAL_COMMUNITY): Payer: Self-pay | Admitting: Emergency Medicine

## 2018-10-08 ENCOUNTER — Ambulatory Visit (HOSPITAL_COMMUNITY)
Admission: EM | Admit: 2018-10-08 | Discharge: 2018-10-08 | Disposition: A | Discharge status: Home / Self Care | Payer: Medicare Other | Attending: Internal Medicine | Admitting: Internal Medicine

## 2018-10-08 DIAGNOSIS — Z86718 Personal history of other venous thrombosis and embolism: Secondary | ICD-10-CM | POA: Insufficient documentation

## 2018-10-08 DIAGNOSIS — R109 Unspecified abdominal pain: Secondary | ICD-10-CM | POA: Diagnosis present

## 2018-10-08 DIAGNOSIS — Z7982 Long term (current) use of aspirin: Secondary | ICD-10-CM | POA: Insufficient documentation

## 2018-10-08 DIAGNOSIS — N183 Chronic kidney disease, stage 3 (moderate): Secondary | ICD-10-CM | POA: Insufficient documentation

## 2018-10-08 DIAGNOSIS — E1022 Type 1 diabetes mellitus with diabetic chronic kidney disease: Secondary | ICD-10-CM | POA: Diagnosis not present

## 2018-10-08 DIAGNOSIS — I2510 Atherosclerotic heart disease of native coronary artery without angina pectoris: Secondary | ICD-10-CM | POA: Insufficient documentation

## 2018-10-08 DIAGNOSIS — Z794 Long term (current) use of insulin: Secondary | ICD-10-CM | POA: Insufficient documentation

## 2018-10-08 DIAGNOSIS — F419 Anxiety disorder, unspecified: Secondary | ICD-10-CM | POA: Insufficient documentation

## 2018-10-08 DIAGNOSIS — R1030 Lower abdominal pain, unspecified: Secondary | ICD-10-CM

## 2018-10-08 DIAGNOSIS — K219 Gastro-esophageal reflux disease without esophagitis: Secondary | ICD-10-CM | POA: Diagnosis not present

## 2018-10-08 DIAGNOSIS — I130 Hypertensive heart and chronic kidney disease with heart failure and stage 1 through stage 4 chronic kidney disease, or unspecified chronic kidney disease: Secondary | ICD-10-CM | POA: Insufficient documentation

## 2018-10-08 DIAGNOSIS — Z79899 Other long term (current) drug therapy: Secondary | ICD-10-CM | POA: Diagnosis not present

## 2018-10-08 DIAGNOSIS — Z7901 Long term (current) use of anticoagulants: Secondary | ICD-10-CM | POA: Insufficient documentation

## 2018-10-08 DIAGNOSIS — Z955 Presence of coronary angioplasty implant and graft: Secondary | ICD-10-CM | POA: Insufficient documentation

## 2018-10-08 DIAGNOSIS — E871 Hypo-osmolality and hyponatremia: Secondary | ICD-10-CM | POA: Diagnosis not present

## 2018-10-08 DIAGNOSIS — Z87891 Personal history of nicotine dependence: Secondary | ICD-10-CM | POA: Insufficient documentation

## 2018-10-08 DIAGNOSIS — E785 Hyperlipidemia, unspecified: Secondary | ICD-10-CM | POA: Insufficient documentation

## 2018-10-08 DIAGNOSIS — I252 Old myocardial infarction: Secondary | ICD-10-CM | POA: Insufficient documentation

## 2018-10-08 DIAGNOSIS — M109 Gout, unspecified: Secondary | ICD-10-CM | POA: Diagnosis not present

## 2018-10-08 DIAGNOSIS — I5032 Chronic diastolic (congestive) heart failure: Secondary | ICD-10-CM | POA: Insufficient documentation

## 2018-10-08 DIAGNOSIS — R110 Nausea: Secondary | ICD-10-CM | POA: Insufficient documentation

## 2018-10-08 LAB — POCT URINALYSIS DIP (DEVICE)
Bilirubin Urine: NEGATIVE
GLUCOSE, UA: 500 mg/dL — AB
KETONES UR: NEGATIVE mg/dL
Leukocytes, UA: NEGATIVE
Nitrite: NEGATIVE
PH: 6.5 (ref 5.0–8.0)
Specific Gravity, Urine: 1.015 (ref 1.005–1.030)
UROBILINOGEN UA: 0.2 mg/dL (ref 0.0–1.0)
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
## 2018-10-08 NOTE — Discharge Instructions (Addendum)
No danger signs on exam.  Symptoms seem most consistent with response to recent vaccines for flu and shingles, as well as healing bronchitis.  Cough sometimes takes a few weeks to subside.  Recheck for new fever >100.5, increasing phlegm production/nasal discharge, or if not starting to improve in a few days.   Urine test did not suggest a UTI; urine culture is pending.  The urgent care will contact you if the urine culture suggests that additional treatment is needed.  Abdominal xray was normal.  Exam of the chest was clear with no evidence of pneumonia.  Push fluids and rest.  Tylenol as needed for aches.

## 2018-10-08 NOTE — ED Triage Notes (Signed)
Pt states she had the flu shot and shingles shot on Tuesday and shes had no appetite and upset stomach since then and she needs an "antibiotic".

## 2018-10-09 LAB — URINE CULTURE: CULTURE: NO GROWTH

## 2018-10-11 ENCOUNTER — Other Ambulatory Visit: Payer: Self-pay

## 2018-10-12 ENCOUNTER — Ambulatory Visit (INDEPENDENT_AMBULATORY_CARE_PROVIDER_SITE_OTHER): Payer: Medicare Other | Admitting: Sports Medicine

## 2018-10-12 ENCOUNTER — Encounter: Payer: Self-pay | Admitting: Sports Medicine

## 2018-10-12 ENCOUNTER — Ambulatory Visit (INDEPENDENT_AMBULATORY_CARE_PROVIDER_SITE_OTHER): Payer: Medicare Other

## 2018-10-12 VITALS — BP 140/67 | HR 65

## 2018-10-12 DIAGNOSIS — M7750 Other enthesopathy of unspecified foot: Secondary | ICD-10-CM | POA: Diagnosis not present

## 2018-10-12 DIAGNOSIS — I739 Peripheral vascular disease, unspecified: Secondary | ICD-10-CM

## 2018-10-12 DIAGNOSIS — M19079 Primary osteoarthritis, unspecified ankle and foot: Secondary | ICD-10-CM

## 2018-10-12 DIAGNOSIS — M7752 Other enthesopathy of left foot: Secondary | ICD-10-CM

## 2018-10-12 DIAGNOSIS — M7751 Other enthesopathy of right foot: Secondary | ICD-10-CM

## 2018-10-12 DIAGNOSIS — E1010 Type 1 diabetes mellitus with ketoacidosis without coma: Secondary | ICD-10-CM | POA: Diagnosis not present

## 2018-10-12 DIAGNOSIS — N183 Chronic kidney disease, stage 3 unspecified: Secondary | ICD-10-CM

## 2018-10-12 MED ORDER — DICLOFENAC SODIUM 1 % TD GEL: 4.0000 g | Freq: Four times a day (QID) | TRANSDERMAL | 1 refills | Status: DC

## 2018-10-12 NOTE — Progress Notes (Signed)
Subjective: Alejandra Hernandez is a 62 y.o. female patient who presents to office for evaluation of Right and left foot pain. Patient complains of progressive pain to both feet that hurts all over stabbing pain and sometimes worse at big toe joints. Ranks pain 6/10 and is now interferring with daily activities especially walking. Patient has tried nothing. Admits to past history of gout and was taking off of meds because of her kidneys. Patient denies any other pedal complaints. Denies injury/trip/fall/sprain/any causative factors.   Review of Systems  All other systems reviewed and are negative.    Patient Active Problem List   Diagnosis Date Noted  . Type 1 diabetes ([place]) 05/23/2018  . CKD (chronic kidney disease), stage III ([place]) 05/14/2018  . Unstable angina ([place]) 05/14/2018  . Hemoptysis 02/24/2018  . Hyperglycemia 12/23/2017  . Low TSH level 06/22/2017  . CAD (coronary artery disease)   . Nodule on liver 05/22/2017  . Coronary artery calcification seen on CAT scan 05/14/2017  . Heart palpitations 05/14/2017  . Unspecified skin changes 04/18/2017  . History of tobacco use 03/17/2017  . Benign neoplasm of descending colon   . Benign neoplasm of sigmoid colon   . Chronic diastolic CHF (congestive heart failure) ([place]) 02/09/2017  . History of DVT (deep vein thrombosis)   . Abnormal CT scan, chest   . Acute urinary retention   . Claudication ([place]) 07/23/2016  . PAD (peripheral artery disease) ([place])   . Essential hypertension 05/14/2016  . Hyponatremia   . GERD (gastroesophageal reflux disease) 05/17/2014  . Carotid artery disease ([place]) 05/17/2014  . Microcytic anemia 04/26/2014  . Chest pain 04/25/2014  . Dyspnea 04/25/2014  . Carotid artery bruit 04/25/2014  . Hyperlipidemia with target low density lipoprotein (LDL) cholesterol less than 70 mg/dL   . Gout     Current Outpatient Medications on File Prior to Visit  Medication Sig Dispense Refill  . albuterol (PROAIR HFA) 108  (90 Base) MCG/ACT inhaler Inhale 2 puffs into the lungs every 4 (four) hours as needed for wheezing or shortness of breath. 1 Inhaler 5  . ALPRAZolam (XANAX) 0.5 MG tablet Take 0.5 mg by mouth 2 (two) times daily as needed for anxiety.   2  . amitriptyline (ELAVIL) 25 MG tablet Take 50 mg by mouth at bedtime.   5  . amLODipine (NORVASC) 10 MG tablet Take 10 mg by mouth at bedtime.    Marland Kitchen aspirin 81 MG chewable tablet Chew 1 tablet (81 mg total) by mouth daily. 30 tablet 3  . azithromycin (ZITHROMAX) 250 MG tablet Take 250 mg by mouth daily.  0  . carvedilol (COREG) 12.5 MG tablet Take 1 tablet (12.5 mg total) by mouth 2 (two) times daily. 180 tablet 0  . cloNIDine (CATAPRES) 0.1 MG tablet Take 1 tablet (0.1 mg total) by mouth 2 (two) times daily. (Patient taking differently: Take 0.2 mg by mouth 2 (two) times daily. ) 60 tablet 0  . ezetimibe (ZETIA) 10 MG tablet TAKE 1 TABLET(10 MG) BY MOUTH DAILY 90 tablet 3  . ferrous sulfate 325 (65 FE) MG tablet Take 325 mg by mouth 2 (two) times daily with a meal.    . furosemide (LASIX) 20 MG tablet TK 1 T PO QAM  5  . gabapentin (NEURONTIN) 600 MG tablet Take 0.5 tablets (300 mg total) by mouth 3 (three) times daily. 90 tablet 2  . hydroxypropyl methylcellulose / hypromellose (ISOPTO TEARS / GONIOVISC) 2.5 % ophthalmic solution Place 1 drop into  both eyes 3 (three) times daily as needed for dry eyes.    . Insulin Glargine (LANTUS SOLOSTAR) 100 UNIT/ML Solostar Pen Inject 140 Units into the skin every morning. And pen needles 2/day 15 pen PRN  . isosorbide mononitrate (IMDUR) 30 MG 24 hr tablet Take 3 tablets (90 mg total) by mouth daily. 90 tablet 11  . metolazone (ZAROXOLYN) 2.5 MG tablet   5  . metoprolol tartrate (LOPRESSOR) 100 MG tablet   11  . nitroGLYCERIN (NITROSTAT) 0.4 MG SL tablet PLACE 1 TABLET UNDER THE TONGUE EVERY 5 MINUTES AS NEEDED FOR CHEST PAIN 25 tablet 0  . oxyCODONE-acetaminophen (PERCOCET) 10-325 MG tablet Take 1 tablet by mouth 3  (three) times daily.   0  . pantoprazole (PROTONIX) 40 MG tablet TAKE 1 TABLET BY MOUTH TWICE DAILY BEFORE A MEAL (Patient taking differently: TAKE 1 TABLET BY MOUTH DAILY BEFORE A MEAL) 180 tablet 0  . penicillin v potassium (VEETID) 500 MG tablet   0  . potassium chloride SA (K-DUR,KLOR-CON) 20 MEQ tablet Take 1 tablet (20 mEq total) by mouth daily. 90 tablet 1  . rivaroxaban (XARELTO) 20 MG TABS tablet Take 1 tablet (20 mg total) by mouth daily with supper. 30 tablet 11  . rosuvastatin (CRESTOR) 40 MG tablet Take 40 mg by mouth at bedtime.    . Vitamin D, Ergocalciferol, (DRISDOL) 50000 units CAPS capsule TK 1 C PO 1 TIME A WK  10   No current facility-administered medications on file prior to visit.     No Known Allergies  Objective:  General: Alert and oriented x3 in no acute distress  Dermatology: No open lesions bilateral lower extremities, no webspace macerations, no ecchymosis bilateral, all nails x 10 are mildly elongated but well manicured.  Vascular: Dorsalis Pedis and Posterior Tibial pedal pulses non palpable, Capillary Fill Time 5 seconds,(-) pedal hair growth bilateral, trace edema bilateral lower extremities,  Hyperpigmentation bilateral.Temperature gradient within normal limits.  Neurology: Johney Maine sensation intact via light touch bilateral, Protective sensation intact  with Semmes Weinstein Monofilament to all pedal sites, Position sense intact, vibratory intact bilateral, Deep tendon reflexes within normal limits bilateral, No babinski sign present bilateral. (- )Tinels sign bilateral.   Musculoskeletal: Mild tenderness with palpation bilateral but most pain today at 1st MTPJs, + hammertoe and +pes planus, Strength within normal limits in all groups bilateral.   Gait: Antalgic gait  Xrays  Right and left Foot   Impression: Mild erosions at 1st MTPJ with square met head possible related to history of gout. No other acute findings.   Assessment and Plan: Problem List  Items Addressed This Visit      Cardiovascular and Mediastinum   PAD (peripheral artery disease) ([place])     Endocrine   Type 1 diabetes (HCC)     Genitourinary   CKD (chronic kidney disease), stage III ([place])    Other Visit Diagnoses    Arthritis of foot    -  Primary   Relevant Medications   diclofenac sodium (VOLTAREN) 1 % GEL   Other Relevant Orders   DG Foot Complete Right (Completed)   DG Foot Complete Left (Completed)   Tendinitis of ankle or foot           -Complete examination performed -Xrays reviewed -Discussed treatement options for diffuse foot pain and past history of gout -Patient declined steriod injection -Rx Diclofenac gel for pain -Advised closed vascular follow up due to pain in lower extremities with decreased pulses  -Patient  to return to office in 3 months for diabetic foot check/care or sooner if condition worsens.  Landis Martins, DPM

## 2018-10-18 ENCOUNTER — Ambulatory Visit (HOSPITAL_COMMUNITY)
Admission: EM | Admit: 2018-10-18 | Discharge: 2018-10-18 | Disposition: A | Discharge status: Home / Self Care | Payer: Medicare Other | Attending: Family Medicine | Admitting: Family Medicine

## 2018-10-18 ENCOUNTER — Encounter (HOSPITAL_COMMUNITY): Payer: Self-pay | Admitting: Emergency Medicine

## 2018-10-18 DIAGNOSIS — I739 Peripheral vascular disease, unspecified: Secondary | ICD-10-CM | POA: Diagnosis not present

## 2018-10-18 DIAGNOSIS — M79661 Pain in right lower leg: Secondary | ICD-10-CM

## 2018-10-18 DIAGNOSIS — I82723 Chronic embolism and thrombosis of deep veins of upper extremity, bilateral: Secondary | ICD-10-CM

## 2018-10-18 NOTE — ED Triage Notes (Signed)
Pt c/o R leg pain, hx of blood clots, pt is on xarelto at this time. Hx of PAD, DM type 1, CKD, pt states she has stents in both legs. Pt states she saw her foot doctor. Podiatrist note states she needs follow up with vascular. Pt states she has appt with vascular MD on dec 10th, states she has not called him.

## 2018-10-19 ENCOUNTER — Telehealth: Payer: Self-pay | Admitting: *Deleted

## 2018-10-19 ENCOUNTER — Encounter: Payer: Self-pay | Admitting: Cardiovascular Disease

## 2018-10-19 ENCOUNTER — Ambulatory Visit (INDEPENDENT_AMBULATORY_CARE_PROVIDER_SITE_OTHER): Payer: Medicare Other | Admitting: Cardiovascular Disease

## 2018-10-19 VITALS — BP 140/60 | HR 78 | Ht 63.0 in | Wt 161.0 lb

## 2018-10-19 DIAGNOSIS — Z01812 Encounter for preprocedural laboratory examination: Secondary | ICD-10-CM

## 2018-10-19 DIAGNOSIS — E785 Hyperlipidemia, unspecified: Secondary | ICD-10-CM

## 2018-10-19 DIAGNOSIS — I739 Peripheral vascular disease, unspecified: Secondary | ICD-10-CM | POA: Diagnosis not present

## 2018-10-19 DIAGNOSIS — I779 Disorder of arteries and arterioles, unspecified: Secondary | ICD-10-CM

## 2018-10-19 LAB — CBC WITH DIFFERENTIAL/PLATELET
Basophils Absolute: 0.1 10*3/uL (ref 0.0–0.2)
Basos: 1 %
EOS (ABSOLUTE): 0.2 10*3/uL (ref 0.0–0.4)
EOS: 2 %
HEMOGLOBIN: 11.9 g/dL (ref 11.1–15.9)
Hematocrit: 39.1 % (ref 34.0–46.6)
Lymphocytes Absolute: 3.1 10*3/uL (ref 0.7–3.1)
Lymphs: 39 %
MCH: 20.5 pg — AB (ref 26.6–33.0)
MCHC: 30.4 g/dL — ABNORMAL LOW (ref 31.5–35.7)
MCV: 67 fL — ABNORMAL LOW (ref 79–97)
MONOCYTES: 9 %
MONOS ABS: 0.7 10*3/uL (ref 0.1–0.9)
NEUTROS PCT: 49 %
Neutrophils Absolute: 3.8 10*3/uL (ref 1.4–7.0)
Platelets: 229 10*3/uL (ref 150–450)
RBC: 5.81 x10E6/uL — AB (ref 3.77–5.28)
RDW: 19.3 % — ABNORMAL HIGH (ref 12.3–15.4)
WBC: 7.8 10*3/uL (ref 3.4–10.8)

## 2018-10-19 LAB — BASIC METABOLIC PANEL
BUN / CREAT RATIO: 9 — AB (ref 12–28)
BUN: 11 mg/dL (ref 8–27)
CO2: 24 mmol/L (ref 20–29)
CREATININE: 1.22 mg/dL — AB (ref 0.57–1.00)
Calcium: 9 mg/dL (ref 8.7–10.3)
Chloride: 99 mmol/L (ref 96–106)
GFR calc Af Amer: 55 mL/min/{1.73_m2} — ABNORMAL LOW (ref >59)
GFR calc non Af Amer: 48 mL/min/{1.73_m2} — ABNORMAL LOW (ref >59)
GLUCOSE: 367 mg/dL — AB (ref 65–99)
Potassium: 4.4 mmol/L (ref 3.5–5.2)
Sodium: 133 mmol/L — ABNORMAL LOW (ref 134–144)

## 2018-10-19 NOTE — Patient Instructions (Signed)
Medication Instructions:  Your physician recommends that you continue on your current medications as directed. Please refer to the Current Medication list given to you today.  If you need a refill on your cardiac medications before your next appointment, please call your pharmacy.   Lab work: Your physician recommends that you return for lab work in: TODAY  If you have labs (blood work) drawn today and your tests are completely normal, you will receive your results only by: Marland Kitchen MyChart Message (if you have MyChart) OR . A paper copy in the mail If you have any lab test that is abnormal or we need to change your treatment, we will call you to review the results.  Testing/Procedures: none  Follow-Up: At [facility], you and your health needs are our priority.  As part of our continuing mission to provide you with exceptional heart care, we have created designated Provider Care Teams.  These Care Teams include your primary Cardiologist (physician) and Advanced Practice Providers (APPs -  Physician Assistants and Nurse Practitioners) who all work together to provide you with the care you need, when you need it. Keep scheduled follow-up appt on 12/10 @ 10 AM.  Please call our office 2 months in advance to schedule this appointment.  You may see Dr. Fletcher Anon - PV or one of the following Advanced Practice Providers on your designated Care Team:   Kerin Ransom, PA-C Roby Lofts, [place] . Sande Rives, PA-C  Any Other Special Instructions Will Be Listed Below (If Applicable).
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
## 2018-10-19 NOTE — Telephone Encounter (Signed)
Left a message for the patient to call back. She will need to take half the dose of Lantus tonight due to her procedure tomorrow morning.

## 2018-10-19 NOTE — Telephone Encounter (Signed)
Unable to reach the patient

## 2018-10-19 NOTE — Telephone Encounter (Signed)
Left a message for the patient to call back concerning her Lantus.

## 2018-10-20 ENCOUNTER — Ambulatory Visit (HOSPITAL_COMMUNITY)
Admission: RE | Admit: 2018-10-20 | Discharge: 2018-10-21 | Disposition: A | Discharge status: Home / Self Care | Payer: Medicare Other | Source: Ambulatory Visit | Attending: Cardiovascular Disease | Admitting: Cardiovascular Disease

## 2018-10-20 ENCOUNTER — Encounter (HOSPITAL_COMMUNITY): Payer: Self-pay | Admitting: Cardiovascular Disease

## 2018-10-20 ENCOUNTER — Other Ambulatory Visit: Payer: Self-pay

## 2018-10-20 ENCOUNTER — Encounter (HOSPITAL_COMMUNITY)
Admission: RE | Disposition: A | Discharge status: Home / Self Care | Payer: Self-pay | Source: Ambulatory Visit | Attending: Cardiovascular Disease

## 2018-10-20 DIAGNOSIS — Z794 Long term (current) use of insulin: Secondary | ICD-10-CM | POA: Diagnosis not present

## 2018-10-20 DIAGNOSIS — G8929 Other chronic pain: Secondary | ICD-10-CM | POA: Insufficient documentation

## 2018-10-20 DIAGNOSIS — Z955 Presence of coronary angioplasty implant and graft: Secondary | ICD-10-CM | POA: Diagnosis not present

## 2018-10-20 DIAGNOSIS — Z87891 Personal history of nicotine dependence: Secondary | ICD-10-CM | POA: Diagnosis not present

## 2018-10-20 DIAGNOSIS — I252 Old myocardial infarction: Secondary | ICD-10-CM | POA: Diagnosis not present

## 2018-10-20 DIAGNOSIS — M1990 Unspecified osteoarthritis, unspecified site: Secondary | ICD-10-CM | POA: Insufficient documentation

## 2018-10-20 DIAGNOSIS — Z86718 Personal history of other venous thrombosis and embolism: Secondary | ICD-10-CM | POA: Insufficient documentation

## 2018-10-20 DIAGNOSIS — I70221 Atherosclerosis of native arteries of extremities with rest pain, right leg: Secondary | ICD-10-CM | POA: Diagnosis not present

## 2018-10-20 DIAGNOSIS — E1142 Type 2 diabetes mellitus with diabetic polyneuropathy: Secondary | ICD-10-CM | POA: Diagnosis not present

## 2018-10-20 DIAGNOSIS — F419 Anxiety disorder, unspecified: Secondary | ICD-10-CM | POA: Insufficient documentation

## 2018-10-20 DIAGNOSIS — I739 Peripheral vascular disease, unspecified: Secondary | ICD-10-CM

## 2018-10-20 DIAGNOSIS — E785 Hyperlipidemia, unspecified: Secondary | ICD-10-CM | POA: Insufficient documentation

## 2018-10-20 DIAGNOSIS — N183 Chronic kidney disease, stage 3 (moderate): Secondary | ICD-10-CM | POA: Insufficient documentation

## 2018-10-20 DIAGNOSIS — M545 Low back pain: Secondary | ICD-10-CM | POA: Insufficient documentation

## 2018-10-20 DIAGNOSIS — K219 Gastro-esophageal reflux disease without esophagitis: Secondary | ICD-10-CM | POA: Diagnosis not present

## 2018-10-20 DIAGNOSIS — I2510 Atherosclerotic heart disease of native coronary artery without angina pectoris: Secondary | ICD-10-CM | POA: Insufficient documentation

## 2018-10-20 DIAGNOSIS — Z7901 Long term (current) use of anticoagulants: Secondary | ICD-10-CM | POA: Diagnosis not present

## 2018-10-20 DIAGNOSIS — I129 Hypertensive chronic kidney disease with stage 1 through stage 4 chronic kidney disease, or unspecified chronic kidney disease: Secondary | ICD-10-CM | POA: Insufficient documentation

## 2018-10-20 DIAGNOSIS — I70201 Unspecified atherosclerosis of native arteries of extremities, right leg: Secondary | ICD-10-CM

## 2018-10-20 DIAGNOSIS — Z7982 Long term (current) use of aspirin: Secondary | ICD-10-CM | POA: Diagnosis not present

## 2018-10-20 DIAGNOSIS — E1151 Type 2 diabetes mellitus with diabetic peripheral angiopathy without gangrene: Secondary | ICD-10-CM | POA: Diagnosis not present

## 2018-10-20 DIAGNOSIS — E1122 Type 2 diabetes mellitus with diabetic chronic kidney disease: Secondary | ICD-10-CM | POA: Diagnosis not present

## 2018-10-20 HISTORY — PX: ABDOMINAL AORTOGRAM W/LOWER EXTREMITY: CATH118223

## 2018-10-20 HISTORY — PX: PERIPHERAL VASCULAR INTERVENTION: CATH118257

## 2018-10-20 HISTORY — DX: Major depressive disorder, single episode, unspecified: F32.9

## 2018-10-20 HISTORY — DX: Depression, unspecified: F32.A

## 2018-10-20 LAB — GLUCOSE, CAPILLARY
Glucose-Capillary: 373 mg/dL — ABNORMAL HIGH (ref 70–99)
Glucose-Capillary: 380 mg/dL — ABNORMAL HIGH (ref 70–99)
Glucose-Capillary: 179 mg/dL — ABNORMAL HIGH (ref 70–99)
Glucose-Capillary: 211 mg/dL — ABNORMAL HIGH (ref 70–99)
Glucose-Capillary: 139 mg/dL — ABNORMAL HIGH (ref 70–99)

## 2018-10-20 SURGERY — ABDOMINAL AORTOGRAM W/LOWER EXTREMITY
Anesthesia: LOCAL

## 2018-10-20 MED ORDER — GABAPENTIN 600 MG PO TABS
300.0000 mg | ORAL_TABLET | Freq: Three times a day (TID) | ORAL | Status: DC
  Administered 2018-10-20 – 2018-10-21 (×3): 300 mg via ORAL
  Filled 2018-10-20 (×3): qty 1

## 2018-10-20 MED ORDER — NITROGLYCERIN 1 MG/10 ML FOR IR/CATH LAB
INTRA_ARTERIAL | Status: AC
  Filled 2018-10-20: qty 10

## 2018-10-20 MED ORDER — ACETAMINOPHEN 325 MG PO TABS: 650.0000 mg | ORAL_TABLET | ORAL | Status: DC | PRN

## 2018-10-20 MED ORDER — CLOPIDOGREL BISULFATE 300 MG PO TABS
ORAL_TABLET | ORAL | Status: AC
  Filled 2018-10-20: qty 1

## 2018-10-20 MED ORDER — HEPARIN SODIUM (PORCINE) 1000 UNIT/ML IJ SOLN
INTRAMUSCULAR | Status: DC | PRN
  Administered 2018-10-20: 5000 [IU] via INTRAVENOUS

## 2018-10-20 MED ORDER — ASPIRIN 81 MG PO CHEW
81.0000 mg | CHEWABLE_TABLET | ORAL | Status: AC
  Administered 2018-10-20: 81 mg via ORAL

## 2018-10-20 MED ORDER — SODIUM CHLORIDE 0.9% FLUSH: 3.0000 mL | INTRAVENOUS | Status: DC | PRN

## 2018-10-20 MED ORDER — POTASSIUM CHLORIDE CRYS ER 20 MEQ PO TBCR
20.0000 meq | EXTENDED_RELEASE_TABLET | Freq: Every day | ORAL | Status: DC
  Administered 2018-10-21: 20 meq via ORAL
  Filled 2018-10-20: qty 1

## 2018-10-20 MED ORDER — FENTANYL CITRATE (PF) 100 MCG/2ML IJ SOLN
INTRAMUSCULAR | Status: DC | PRN
  Administered 2018-10-20 (×2): 50 ug via INTRAVENOUS

## 2018-10-20 MED ORDER — AMLODIPINE BESYLATE 10 MG PO TABS
10.0000 mg | ORAL_TABLET | Freq: Every day | ORAL | Status: DC
  Administered 2018-10-20: 21:00:00 10 mg via ORAL
  Filled 2018-10-20: qty 1

## 2018-10-20 MED ORDER — LIDOCAINE HCL (PF) 1 % IJ SOLN
INTRAMUSCULAR | Status: DC | PRN
  Administered 2018-10-20: 15 mL

## 2018-10-20 MED ORDER — MIDAZOLAM HCL 2 MG/2ML IJ SOLN
INTRAMUSCULAR | Status: AC
  Filled 2018-10-20: qty 2

## 2018-10-20 MED ORDER — SODIUM CHLORIDE 0.9 % WEIGHT BASED INFUSION
3.0000 mL/kg/h | INTRAVENOUS | Status: DC
  Administered 2018-10-20: 3 mL/kg/h via INTRAVENOUS

## 2018-10-20 MED ORDER — CARVEDILOL 12.5 MG PO TABS
12.5000 mg | ORAL_TABLET | Freq: Two times a day (BID) | ORAL | Status: DC
  Administered 2018-10-20 – 2018-10-21 (×2): 12.5 mg via ORAL
  Filled 2018-10-20 (×2): qty 1

## 2018-10-20 MED ORDER — EZETIMIBE 10 MG PO TABS
10.0000 mg | ORAL_TABLET | Freq: Every day | ORAL | Status: DC
  Administered 2018-10-20 – 2018-10-21 (×2): 10 mg via ORAL
  Filled 2018-10-20 (×2): qty 1

## 2018-10-20 MED ORDER — FENTANYL CITRATE (PF) 100 MCG/2ML IJ SOLN
INTRAMUSCULAR | Status: AC
  Filled 2018-10-20: qty 2

## 2018-10-20 MED ORDER — INSULIN ASPART 100 UNIT/ML ~~LOC~~ SOLN
SUBCUTANEOUS | Status: AC
  Filled 2018-10-20: qty 1

## 2018-10-20 MED ORDER — SODIUM CHLORIDE 0.9 % IV SOLN: 250.0000 mL | INTRAVENOUS | Status: DC | PRN

## 2018-10-20 MED ORDER — PANTOPRAZOLE SODIUM 40 MG PO TBEC
40.0000 mg | DELAYED_RELEASE_TABLET | Freq: Every day | ORAL | Status: DC
  Administered 2018-10-20 – 2018-10-21 (×2): 40 mg via ORAL
  Filled 2018-10-20 (×2): qty 1

## 2018-10-20 MED ORDER — SODIUM CHLORIDE 0.9% FLUSH
3.0000 mL | Freq: Two times a day (BID) | INTRAVENOUS | Status: DC
  Administered 2018-10-20: 15:00:00 3 mL via INTRAVENOUS

## 2018-10-20 MED ORDER — NITROGLYCERIN 1 MG/10 ML FOR IR/CATH LAB
INTRA_ARTERIAL | Status: DC | PRN
  Administered 2018-10-20: 200 ug

## 2018-10-20 MED ORDER — ALBUTEROL SULFATE (2.5 MG/3ML) 0.083% IN NEBU: 3.0000 mL | INHALATION_SOLUTION | RESPIRATORY_TRACT | Status: DC | PRN

## 2018-10-20 MED ORDER — HEPARIN (PORCINE) IN NACL 1000-0.9 UT/500ML-% IV SOLN
INTRAVENOUS | Status: AC
  Filled 2018-10-20: qty 1000

## 2018-10-20 MED ORDER — ISOSORBIDE MONONITRATE ER 60 MG PO TB24
90.0000 mg | ORAL_TABLET | Freq: Every day | ORAL | Status: DC
  Administered 2018-10-20 – 2018-10-21 (×2): 90 mg via ORAL
  Filled 2018-10-20 (×2): qty 1

## 2018-10-20 MED ORDER — CLONIDINE HCL 0.1 MG PO TABS
0.2000 mg | ORAL_TABLET | Freq: Two times a day (BID) | ORAL | Status: DC
  Administered 2018-10-20 – 2018-10-21 (×2): 0.2 mg via ORAL
  Filled 2018-10-20 (×2): qty 2

## 2018-10-20 MED ORDER — SODIUM CHLORIDE 0.9 % WEIGHT BASED INFUSION: 1.0000 mL/kg/h | INTRAVENOUS | Status: DC

## 2018-10-20 MED ORDER — OXYCODONE-ACETAMINOPHEN 5-325 MG PO TABS
1.0000 | ORAL_TABLET | Freq: Three times a day (TID) | ORAL | Status: DC
  Administered 2018-10-20 – 2018-10-21 (×3): 1 via ORAL
  Filled 2018-10-20 (×3): qty 1

## 2018-10-20 MED ORDER — OXYCODONE-ACETAMINOPHEN 10-325 MG PO TABS: 1.0000 | ORAL_TABLET | Freq: Three times a day (TID) | ORAL | Status: DC

## 2018-10-20 MED ORDER — HEPARIN (PORCINE) IN NACL 1000-0.9 UT/500ML-% IV SOLN
INTRAVENOUS | Status: DC | PRN
  Administered 2018-10-20 (×2): 500 mL

## 2018-10-20 MED ORDER — VITAMIN D (ERGOCALCIFEROL) 1.25 MG (50000 UNIT) PO CAPS: 50000.0000 [IU] | ORAL_CAPSULE | ORAL | Status: DC

## 2018-10-20 MED ORDER — ALPRAZOLAM 0.5 MG PO TABS
0.5000 mg | ORAL_TABLET | Freq: Two times a day (BID) | ORAL | Status: DC | PRN
  Administered 2018-10-20 – 2018-10-21 (×3): 0.5 mg via ORAL
  Filled 2018-10-20 (×3): qty 1

## 2018-10-20 MED ORDER — HYPROMELLOSE (GONIOSCOPIC) 2.5 % OP SOLN: 1.0000 [drp] | Freq: Three times a day (TID) | OPHTHALMIC | Status: DC | PRN

## 2018-10-20 MED ORDER — ASPIRIN 81 MG PO CHEW
CHEWABLE_TABLET | ORAL | Status: AC
  Administered 2018-10-20: 81 mg via ORAL
  Filled 2018-10-20: qty 1

## 2018-10-20 MED ORDER — ANGIOPLASTY BOOK
Freq: Once | Status: DC
  Filled 2018-10-20: qty 1

## 2018-10-20 MED ORDER — INSULIN GLARGINE 100 UNIT/ML ~~LOC~~ SOLN
140.0000 [IU] | Freq: Every day | SUBCUTANEOUS | Status: DC
  Administered 2018-10-21: 10:00:00 100 [IU] via SUBCUTANEOUS
  Filled 2018-10-20: qty 1.4

## 2018-10-20 MED ORDER — OXYCODONE HCL 5 MG PO TABS
5.0000 mg | ORAL_TABLET | Freq: Three times a day (TID) | ORAL | Status: DC
  Administered 2018-10-20 – 2018-10-21 (×3): 5 mg via ORAL
  Filled 2018-10-20 (×3): qty 1

## 2018-10-20 MED ORDER — ASPIRIN 81 MG PO CHEW
81.0000 mg | CHEWABLE_TABLET | Freq: Every day | ORAL | Status: DC
  Administered 2018-10-21: 10:00:00 81 mg via ORAL
  Filled 2018-10-20: qty 1

## 2018-10-20 MED ORDER — FUROSEMIDE 20 MG PO TABS
20.0000 mg | ORAL_TABLET | ORAL | Status: DC
  Administered 2018-10-21: 07:00:00 20 mg via ORAL
  Filled 2018-10-20 (×2): qty 1

## 2018-10-20 MED ORDER — ROSUVASTATIN CALCIUM 20 MG PO TABS
40.0000 mg | ORAL_TABLET | Freq: Every day | ORAL | Status: DC
  Administered 2018-10-20: 40 mg via ORAL
  Filled 2018-10-20: qty 2

## 2018-10-20 MED ORDER — LABETALOL HCL 5 MG/ML IV SOLN: 10.0000 mg | INTRAVENOUS | Status: DC | PRN

## 2018-10-20 MED ORDER — FERROUS SULFATE 325 (65 FE) MG PO TABS
325.0000 mg | ORAL_TABLET | Freq: Two times a day (BID) | ORAL | Status: DC
  Administered 2018-10-20 – 2018-10-21 (×2): 325 mg via ORAL
  Filled 2018-10-20 (×2): qty 1

## 2018-10-20 MED ORDER — CLOPIDOGREL BISULFATE 300 MG PO TABS
ORAL_TABLET | ORAL | Status: DC | PRN
  Administered 2018-10-20: 300 mg via ORAL

## 2018-10-20 MED ORDER — SODIUM CHLORIDE 0.9% FLUSH: 3.0000 mL | Freq: Two times a day (BID) | INTRAVENOUS | Status: DC

## 2018-10-20 MED ORDER — INSULIN GLARGINE 100 UNIT/ML SOLOSTAR PEN: 140.0000 [IU] | PEN_INJECTOR | SUBCUTANEOUS | Status: DC

## 2018-10-20 MED ORDER — INSULIN ASPART 100 UNIT/ML ~~LOC~~ SOLN
0.0000 [IU] | Freq: Three times a day (TID) | SUBCUTANEOUS | Status: DC
  Administered 2018-10-20: 15 [IU] via SUBCUTANEOUS
  Administered 2018-10-20: 14:00:00 3 [IU] via SUBCUTANEOUS
  Administered 2018-10-20 – 2018-10-21 (×2): 5 [IU] via SUBCUTANEOUS

## 2018-10-20 MED ORDER — SODIUM CHLORIDE 0.9 % IV SOLN: INTRAVENOUS | Status: AC

## 2018-10-20 MED ORDER — MIDAZOLAM HCL 2 MG/2ML IJ SOLN
INTRAMUSCULAR | Status: DC | PRN
  Administered 2018-10-20: 1 mg via INTRAVENOUS

## 2018-10-20 MED ORDER — LIDOCAINE HCL (PF) 1 % IJ SOLN
INTRAMUSCULAR | Status: AC
  Filled 2018-10-20: qty 30

## 2018-10-20 MED ORDER — ONDANSETRON HCL 4 MG/2ML IJ SOLN: 4.0000 mg | Freq: Four times a day (QID) | INTRAMUSCULAR | Status: DC | PRN

## 2018-10-20 MED ORDER — HEPARIN SODIUM (PORCINE) 1000 UNIT/ML IJ SOLN
INTRAMUSCULAR | Status: AC
  Filled 2018-10-20: qty 1

## 2018-10-20 SURGICAL SUPPLY — 22 items
CATH ANGIO 5F PIGTAIL 65CM (CATHETERS) ×3 IMPLANT
CATH CROSS OVER TEMPO 5F (CATHETERS) ×3 IMPLANT
CATH STRAIGHT 5FR 65CM (CATHETERS) ×3 IMPLANT
CLOSURE MYNX CONTROL 6F/7F (Vascular Products) ×3 IMPLANT
DEVICE CLOSURE MYNXGRIP 5F (Vascular Products) ×3 IMPLANT
KIT ENCORE 26 ADVANTAGE (KITS) ×3 IMPLANT
KIT MICROPUNCTURE NIT STIFF (SHEATH) ×3 IMPLANT
KIT PV (KITS) ×3 IMPLANT
SHEATH BRITE TIP 7FR 35CM (SHEATH) ×3 IMPLANT
SHEATH PINNACLE 5F 10CM (SHEATH) ×3 IMPLANT
SHEATH PINNACLE 7F 10CM (SHEATH) ×3 IMPLANT
SHEATH PROBE COVER 6X72 (BAG) ×3 IMPLANT
STENT OMNILINK ELITE 8X29X80 (Permanent Stent) ×3 IMPLANT
STOPCOCK MORSE 400PSI 3WAY (MISCELLANEOUS) ×3 IMPLANT
SYR MEDRAD MARK 7 150ML (SYRINGE) ×3 IMPLANT
TAPE VIPERTRACK RADIOPAQ (MISCELLANEOUS) ×2 IMPLANT
TAPE VIPERTRACK RADIOPAQUE (MISCELLANEOUS) ×1
TRANSDUCER W/STOPCOCK (MISCELLANEOUS) ×3 IMPLANT
TRAY PV CATH (CUSTOM PROCEDURE TRAY) ×3 IMPLANT
TUBING CIL FLEX 10 FLL-RA (TUBING) ×3 IMPLANT
WIRE HITORQ VERSACORE ST 145CM (WIRE) ×3 IMPLANT
WIRE TORQFLEX AUST .018X40CM (WIRE) ×6 IMPLANT

## 2018-10-20 NOTE — Progress Notes (Addendum)
cbg [id number] today, Eliezer Champagne RN informed, Dr Fletcher Anon requested a recheck in 2 hours. [place] RN updated, will call if further instruction.

## 2018-10-20 NOTE — Interval H&P Note (Signed)
History and Physical Interval Note:  10/20/2018 11:47 AM  Alejandra Hernandez  has presented today for surgery, with the diagnosis of claudication  The various methods of treatment have been discussed with the patient and family. After consideration of risks, benefits and other options for treatment, the patient has consented to  Procedure(s): ABDOMINAL AORTOGRAM W/LOWER EXTREMITY (N/A) as a surgical intervention .  The patient's history has been reviewed, patient examined, no change in status, stable for surgery.  I have reviewed the patient's chart and labs.  Questions were answered to the patient's satisfaction.     Kathlyn Sacramento

## 2018-10-21 ENCOUNTER — Other Ambulatory Visit: Payer: Self-pay | Admitting: Physician Assistant

## 2018-10-21 DIAGNOSIS — E1151 Type 2 diabetes mellitus with diabetic peripheral angiopathy without gangrene: Secondary | ICD-10-CM | POA: Diagnosis not present

## 2018-10-21 DIAGNOSIS — N182 Chronic kidney disease, stage 2 (mild): Secondary | ICD-10-CM

## 2018-10-21 DIAGNOSIS — I739 Peripheral vascular disease, unspecified: Secondary | ICD-10-CM | POA: Diagnosis not present

## 2018-10-21 LAB — BASIC METABOLIC PANEL
ANION GAP: 9 (ref 5–15)
BUN: 10 mg/dL (ref 8–23)
CALCIUM: 8.5 mg/dL — AB (ref 8.9–10.3)
CHLORIDE: 109 mmol/L (ref 98–111)
CO2: 18 mmol/L — AB (ref 22–32)
Creatinine, Ser: 1.53 mg/dL — ABNORMAL HIGH (ref 0.44–1.00)
GFR calc non Af Amer: 36 mL/min — ABNORMAL LOW (ref >60)
GFR, EST AFRICAN AMERICAN: 42 mL/min — AB (ref >60)
Glucose, Bld: 229 mg/dL — ABNORMAL HIGH (ref 70–99)
Potassium: 4 mmol/L (ref 3.5–5.1)
SODIUM: 136 mmol/L (ref 135–145)

## 2018-10-21 LAB — GLUCOSE, CAPILLARY: GLUCOSE-CAPILLARY: 248 mg/dL — AB (ref 70–99)

## 2018-10-21 MED ORDER — POTASSIUM CHLORIDE CRYS ER 20 MEQ PO TBCR: 10.0000 meq | EXTENDED_RELEASE_TABLET | Freq: Every day | ORAL | 1 refills | Status: DC

## 2018-10-21 NOTE — Discharge Summary (Addendum)
Discharge Summary    Patient ID: Alejandra Hernandez  MRN: [id number], DOB/AGE: [id number] y.o.  Admit Date: 10/20/2018 Discharge Date: 10/21/2018  Primary Care Provider: Antonietta Jewel, MD Primary Cardiologist: Dr. 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  She was previously hospitalized in 01/2016 for symptomatic anemia with hemoglobin of 6.2 in setting of peptic ulcer disease.  She is followed by Dr. Fletcher Anon for known severe PAD with severe claudication.  She is status post left SFA directional atherectomy and drug-coated balloon angioplasty in 2017 as well as drug-coated balloon angioplasty to the mid and distal right SFA in 2018.  Patient was most recently seen by Dr. Fletcher Anon for follow-up of her PAD and noted worsening claudication especially on the right side several months prior.  Repeat vascular studies in 07/2018 showed stable ABI on the right at 0.75 with borderline significant proximal and mid SFA disease.  Medical therapy was continued.  However earlier in 09/2018 he experienced significant worsening of right calf discomfort which progressed to rest pain.  Because of this it was recommended to proceed with urgent abdominal aortogram with lower extremity runoff.  Ideally, her Xarelto would have like to have been held for 2 days prior to the procedure but given the urgency, this was held for 1 day which was close to 48-hour since her most recent dose.  Patient underwent cardiac cath in 04/2017 that showed proximal to mid  circumflex 20% stenosis, mid circumflex 60% stenosis, mid RCA 90% stenosis.  She underwent successful PCI/DES to the RCA.  Repeat cath in 05/2017 showed a patent RCA stent with a now occluded left circumflex and was a poor target for PCI.  Medical management was recommended.  Patient developed hematemesis and 07/2017 and ultimately her aspirin was stopped.  She remained on Plavix and Xarelto (history of unprovoked DVT).  She has noted intermittent chest pain requiring titration of long-acting nitrates.  She most recently underwent cardiac cath on 05/17/2018 that showed the previously placed mid RCA stent was widely patent along with mid left circumflex 30% stenosis and distal circumflex 75% stenosis.  The patency of her circumflex was new as prior cath and 05/2017 as outlined above was occluded in the midportion.  She did have a lesion and a small continuation of the AV groove circumflex that was too small to intervene upon.  Medical therapy was recommended.  Hospital Course     Consultants: pharmacy   PAD She presented to the hospital on 10/20/2018 for planned abdominal aortogram with lower extremity runoff that showed a significant right common iliac artery stenosis at 60 to 70% with 20 to 25 mmHg systolic gradient.  The right SFA was patent with moderate ostial disease and mild to moderate diffuse nonobstructive disease throughout its course.  One-vessel runoff below the knee artery.  She underwent successful stent placement to the right common iliac artery.  The patient did not have critical disease to explain her rest pain on the right side was felt to possibly have another  nonvascular etiology.  The decision was made to treat the right common iliac artery stenosis given that it was significant by gradient.  Following the procedure, she was hydrated overnight given her underlying CKD.  She has not had any bleeding issues with the recommendation to resume Xarelto on 10/21/2018.  Given her prior GI bleed, she  was not treated with Plavix once Xarelto was started.  Postprocedure BMP showed a serum creatinine of 1.53 which is near her approximate baseline though was elevated from recent check on 11/26 at 1.22.  It has been recommended the patient obtain a follow-up BMP in 1 week.  This order has been placed and message has been sent to the office.  Postprocedure day 1 the patient was noted to have stable Meeks closed vascular sites bilaterally.  She continued to have right foot pain which was felt to possibly be neuropathic.  She has follow-up with PCP on 12/2 as well as her neurologist later in the week of 12/2 and she will discuss the potential of neuropathic pain with them.  She will be scheduled for follow-up lower extremity arterial duplex in 2 weeks.  This has been sent to the office for scheduling.  History of unprovoked bilateral DVT No postprocedure bleeding.  Okay to resume Xarelto today.  This was discussed with the patient in detail at time of discharge.  Hypertension Stable.  Continue current medications as outlined below.  CAD Stable.  Remains on aspirin, Coreg, Crestor, Zetia, Imdur.  CKD Discharge creatinine noted to be 1.5.  Prior to the procedure creatinine was 1.2 although her baseline seems to be around 1.4.  We will plan to check a follow-up BMP on the week of 12/2.  Her KCl supplement was decreased to 10 mEq at discharge.  The patient's vascular access sites has been examined is healing well without issues at this time. The patient has been seen by Dr. Gwenlyn Found and felt to be stable for discharge today. All follow up appointments have been made. Discharge medications are listed below. Prescriptions have been reviewed with the patient and printed, as indicated.  _____________  Discharge Vitals Blood pressure (!) 118/42, pulse 73, temperature 97.6 F (36.4 C), temperature source Oral, resp. rate 18, height 5\' 3"  (1.6 m), weight 73.2 kg, SpO2 97 %.  Filed Weights   10/20/18 0736 10/21/18  0701  Weight: 73 kg 73.2 kg    Labs & Radiologic Studies    CBC Recent Labs    10/19/18 1021  WBC 7.8  NEUTROABS 3.8  HGB 11.9  HCT 39.1  MCV 67*  PLT 967   Basic Metabolic Panel Recent Labs    10/19/18 1021 10/21/18 0236  NA 133* 136  K 4.4 4.0  CL 99 109  CO2 24 18*  GLUCOSE 367* 229*  BUN 11 10  CREATININE 1.22* 1.53*  CALCIUM 9.0 8.5*   Liver Function Tests No results for input(s): AST, ALT, ALKPHOS, BILITOT, PROT, ALBUMIN in the last 72 hours. No results for input(s): LIPASE, AMYLASE in the last 72 hours. Cardiac Enzymes No results for input(s): CKTOTAL, CKMB, CKMBINDEX, TROPONINI in the last 72 hours. BNP Invalid input(s): POCBNP D-Dimer No results for input(s): DDIMER in the last 72 hours. Hemoglobin A1C No results for input(s): HGBA1C in the last 72 hours. Fasting Lipid Panel No results for input(s): CHOL, HDL, LDLCALC, TRIG, CHOLHDL, LDLDIRECT in the last 72 hours. Thyroid Function Tests No results for input(s): TSH, T4TOTAL, T3FREE, THYROIDAB in the last 72 hours.  Invalid input(s):  FREET3 _____________  Bea Graff 2 Views  Result Date: 10/08/2018 CLINICAL DATA:  Patient states that she's had lower abdominal pain with some nausea x 1 week since having flu and shingles vaccines, some dry coughing causing center chest discomfort. Hx of c-section. EXAM: ABDOMEN - 2 VIEW COMPARISON:  None. FINDINGS: Degenerative facet arthropathy with sclerosis and joint space narrowing at L5-S1 bilaterally. No acute osseous abnormality is noted. Bowel gas pattern is unremarkable. No free air is identified. There is no organomegaly. No radiopaque calculi are noted. Phleboliths are seen in the right hemipelvis. IMPRESSION: 1. Degenerative facet arthropathy at L5-S1. 2. No acute abnormality of the abdomen and pelvis. Electronically Signed   By: Ashley Royalty M.D.   On: 10/08/2018 13:33   Dg Foot Complete Left  Result Date: 10/12/2018 Please see detailed radiograph report in  office note.  Dg Foot Complete Right  Result Date: 10/12/2018 Please see detailed radiograph report in office note.   Diagnostic Studies/Procedures   Abdominal aortogram with lower extremity 10/20/2018: Conclusion   1.  Significant right common iliac artery stenosis at 60 to 70% with 20 to 25 mm systolic gradient. 2.  Patent right SFA with moderate ostial disease and mild to moderate diffuse nonobstructive disease throughout its course.  One-vessel runoff below the knee via the anterior tibial artery. 3.  Successful stent placement to the right common iliac artery.  Recommendations: The patient does not have critical disease to explain rest pain on the right side which might be due to another nonvascular etiology.  I elected to treat the right common iliac artery stenosis given that was significant by gradient. We will hydrate overnight given underlying chronic kidney disease. If no bleeding issues, Xarelto can be resumed tomorrow.  Given her prior GI bleed, I am not planning to treat her with Plavix once Xarelto is resumed.    _____________  Disposition   Pt is being discharged home today in good condition.  Follow-up Plans & Appointments    Follow-up Information    Sueanne Margarita, MD Follow up.   Specialty:  Cardiology Why:  A message has been sent for hospital follow-up Contact information: [address] [place] [address] [phone]        Wellington Hampshire, MD Follow up on 11/02/2018.   Specialty:  Cardiology Why:  Appointment time 10 AM Contact information: [address] [place] [address] [phone]          Discharge Instructions    Diet - low sodium heart healthy   Complete by:  As directed    Increase activity slowly   Complete by:  As directed       Discharge Medications   Allergies as of 10/21/2018   No Known Allergies     Medication List    TAKE these medications   albuterol 108 (90 Base)  MCG/ACT inhaler Commonly known as:  PROVENTIL HFA;VENTOLIN HFA Inhale 2 puffs into the lungs every 4 (four) hours as needed for wheezing or shortness of breath.   ALPRAZolam 0.5 MG tablet Commonly known as:  XANAX Take 0.5 mg by mouth 2 (two) times daily as needed for anxiety.   amLODipine 10 MG tablet Commonly known as:  NORVASC Take 10 mg by mouth at bedtime.   aspirin 81 MG chewable tablet Chew 1 tablet (81 mg total) by mouth daily.   carvedilol 12.5 MG tablet Commonly known as:  COREG Take 1 tablet (12.5 mg total) by mouth 2 (  two) times daily.   cloNIDine 0.2 MG tablet Commonly known as:  CATAPRES Take 0.2 mg by mouth 2 (two) times daily.   diclofenac sodium 1 % Gel Commonly known as:  VOLTAREN Apply 4 g topically 4 (four) times daily.   ezetimibe 10 MG tablet Commonly known as:  ZETIA TAKE 1 TABLET(10 MG) BY MOUTH DAILY What changed:  See the new instructions.   ferrous sulfate 325 (65 FE) MG tablet Take 325 mg by mouth 2 (two) times daily with a meal.   furosemide 20 MG tablet Commonly known as:  LASIX Take 20 mg by mouth every morning.   gabapentin 600 MG tablet Commonly known as:  NEURONTIN Take 0.5 tablets (300 mg total) by mouth 3 (three) times daily.   hydroxypropyl methylcellulose / hypromellose 2.5 % ophthalmic solution Commonly known as:  ISOPTO TEARS / GONIOVISC Place 1 drop into both eyes 3 (three) times daily as needed for dry eyes.   Insulin Glargine 100 UNIT/ML Solostar Pen Commonly known as:  LANTUS Inject 140 Units into the skin every morning. And pen needles 2/day What changed:  additional instructions   isosorbide mononitrate 30 MG 24 hr tablet Commonly known as:  IMDUR Take 3 tablets (90 mg total) by mouth daily.   nitroGLYCERIN 0.4 MG SL tablet Commonly known as:  NITROSTAT PLACE 1 TABLET UNDER THE TONGUE EVERY 5 MINUTES AS NEEDED FOR CHEST PAIN What changed:  See the new instructions.   oxyCODONE-acetaminophen 10-325 MG  tablet Commonly known as:  PERCOCET Take 1 tablet by mouth 3 (three) times daily.   pantoprazole 40 MG tablet Commonly known as:  PROTONIX TAKE 1 TABLET BY MOUTH TWICE DAILY BEFORE A MEAL What changed:  See the new instructions.   potassium chloride SA 20 MEQ tablet Commonly known as:  K-DUR,KLOR-CON Take 0.5 tablets (10 mEq total) by mouth daily. What changed:  how much to take   rivaroxaban 20 MG Tabs tablet Commonly known as:  XARELTO Take 1 tablet (20 mg total) by mouth daily with supper.   rosuvastatin 40 MG tablet Commonly known as:  CRESTOR Take 40 mg by mouth at bedtime.   Vitamin D (Ergocalciferol) 1.25 MG (50000 UT) Caps capsule Commonly known as:  DRISDOL Take 50,000 Units by mouth every Wednesday.        Aspirin prescribed at discharge?  Yes High Intensity Statin Prescribed? (Lipitor 40-80mg  or Crestor 20-40mg ): Yes Beta Blocker Prescribed? Yes For EF <40%, was ACEI/ARB Prescribed? No: EF > 40% ADP Receptor Inhibitor Prescribed? (i.e. Plavix etc.-Includes Medically Managed Patients): No: prior GI bleed For EF <40%, Aldosterone Inhibitor Prescribed? No: EF > 40% Was EF assessed during THIS hospitalization? No: Recent LHC as above Was Cardiac Rehab II ordered? (Included Medically managed Patients): Yes   Outstanding Labs/Studies   Patient will need follow-up lower extremity arterial duplex in 2 weeks.  Message has been sent to the office for scheduling.  Appointment will be made for the patient follow-up with Dr. Radford Pax or an APP in ~ 2-4 weeks for general cardiology as well as with Dr. Fletcher Anon to be seen within 2-3 weeks, per MD.   Duration of Discharge Encounter   Greater than 30 minutes including physician time.  Signed, Rise Mu, PA-C [facility] Pager: [phone] 10/21/2018, 10:25 AM  Agree with note by Christell Faith, PA-C  Ms. Hilgers had peripheral vascular intervention by Dr. Fletcher Anon yesterday.  She had a stent to her right common iliac artery  with demonstration of mild  to moderate diffuse SFA disease and one-vessel runoff via an anterior tibial artery.  She was intervened on because of right foot pain.  I doubt this was the culprit.  Her groin is stable and she is stable for discharge home today.  Her oral anticoagulant will be restarted.  Plan of Plavix was withheld because of history of GI bleeding.  She will follow-up with Dr. Fletcher Anon as an outpatient.  Lorretta Harp, M.D., [place], [facility], [place] [address] [place], [place]  [address]  [phone] 10/22/2018 7:21 AM

## 2018-10-21 NOTE — Progress Notes (Signed)
Progress Note  Patient Name: Alejandra Hernandez Date of Encounter: 10/21/2018  Primary Cardiologist: Fransico Him, MD  Peripheral interventionalist- Dr. Fletcher Anon  Subjective   Alejandra Hernandez is postop day 1 right common iliac artery stenting via the right femoral approach.  She had angiography via the left femoral approach and had minx closure of both groin successfully.  She still has right foot pain which I suspect is neuropathic.  She stable for discharge  Inpatient Medications    Scheduled Meds: . amLODipine  10 mg Oral QHS  . angioplasty book   Does not apply Once  . aspirin  81 mg Oral Daily  . carvedilol  12.5 mg Oral BID  . cloNIDine  0.2 mg Oral BID  . ezetimibe  10 mg Oral Daily  . ferrous sulfate  325 mg Oral BID WC  . furosemide  20 mg Oral [id number]  . gabapentin  300 mg Oral TID  . insulin aspart  0-15 Units Subcutaneous TID WC  . insulin glargine  140 Units Subcutaneous Daily  . isosorbide mononitrate  90 mg Oral Daily  . oxyCODONE-acetaminophen  1 tablet Oral TID   And  . oxyCODONE  5 mg Oral TID  . pantoprazole  40 mg Oral QAC breakfast  . potassium chloride SA  20 mEq Oral Daily  . rosuvastatin  40 mg Oral QHS  . sodium chloride flush  3 mL Intravenous Q12H   Continuous Infusions: . sodium chloride     PRN Meds: sodium chloride, acetaminophen, albuterol, ALPRAZolam, labetalol, ondansetron (ZOFRAN) IV, sodium chloride flush   Vital Signs    Vitals:   10/20/18 1329 10/20/18 1633 10/20/18 2002 10/21/18 0701  BP: (!) 144/69 (!) 143/57 130/72 (!) 118/42  Pulse: (!) 58 (!) 59 80 73  Resp: 14 18 18 18   Temp: 97.8 F (36.6 C) 97.9 F (36.6 C) (!) 97.4 F (36.3 C) 97.6 F (36.4 C)  TempSrc: Oral Oral Oral Oral  SpO2: 98% 100% 100% 97%  Weight:    73.2 kg  Height:        Intake/Output Summary (Last 24 hours) at 10/21/2018 0837 Last data filed at 10/20/2018 2137 Gross per 24 hour  Intake 480 ml  Output 750 ml  Net -270 ml   Filed Weights   10/20/18 0736  10/21/18 0701  Weight: 73 kg 73.2 kg    Telemetry    Sinus rhythm/sinus bradycardia with occasional PVCs- Personally Reviewed  ECG    Not performed today- Personally Reviewed  Physical Exam   GEN: No acute distress.   Neck: No JVD soft bilateral carotid bruits Cardiac: RRR, no murmurs, rubs, or gallops.  Respiratory: Clear to auscultation bilaterally. GI: Soft, nontender, non-distended  MS: No edema; No deformity. Neuro:  Nonfocal  Psych: Normal affect  Extremities: Both femoral puncture sites are stable   Labs    Chemistry Recent Labs  Lab 10/19/18 1021 10/21/18 0236  NA 133* 136  K 4.4 4.0  CL 99 109  CO2 24 18*  GLUCOSE 367* 229*  BUN 11 10  CREATININE 1.22* 1.53*  CALCIUM 9.0 8.5*  GFRNONAA 48* 36*  GFRAA 55* 42*  ANIONGAP  --  9     Hematology Recent Labs  Lab 10/19/18 1021  WBC 7.8  RBC 5.81*  HGB 11.9  HCT 39.1  MCV 67*  MCH 20.5*  MCHC 30.4*  RDW 19.3*  PLT 229    Cardiac EnzymesNo results for input(s): TROPONINI in the last 168 hours. No  results for input(s): TROPIPOC in the last 168 hours.   BNPNo results for input(s): BNP, PROBNP in the last 168 hours.   DDimer No results for input(s): DDIMER in the last 168 hours.   Radiology    No results found.  Cardiac Studies   Peripheral intervention (10/20/2018)  Conclusion   1.  Significant right common iliac artery stenosis at 60 to 70% with 20 to 25 mm systolic gradient. 2.  Patent right SFA with moderate ostial disease and mild to moderate diffuse nonobstructive disease throughout its course.  One-vessel runoff below the knee via the anterior tibial artery. 3.  Successful stent placement to the right common iliac artery.  Recommendations: The patient does not have critical disease to explain rest pain on the right side which might be due to another nonvascular etiology.  I elected to treat the right common iliac artery stenosis given that was significant by gradient. We will  hydrate overnight given underlying chronic kidney disease. If no bleeding issues, Xarelto can be resumed tomorrow.  Given her prior GI bleed, I am not planning to treat her with Plavix once Xarelto is resumed.     Patient Profile     62 y.o. female with history of CAD status post remote RCA stenting, 75% distal circumflex disease which has been stable and peripheral arterial disease status post bilateral SFA intervention in the past.  Other problems include history of DVT on Xarelto as well as treated hypertension, hyperlipidemia, and diabetes.  She has had a GI bleed in the past as well.  Because of progressive right foot pain which resulted in ER evaluation she underwent peripheral angiography via the left femoral approach by Dr. Fletcher Anon yesterday which was able for a moderate right common iliac artery stenosis which was hemodynamically significant and ultimately stented, moderate diffuse right SFA disease with one-vessel runoff via an anterior tibial artery.   Assessment & Plan    1: Peripheral arterial disease- postop #1 right common iliac artery stenting using a balloon expandable stent.  She does have mild to moderate diffuse right SFA disease with one-vessel runoff via the anterior tibial.  Both groins were Mynx closed successfully and have remained stable today.  I do not think that her symptoms are related to her peripheral vasculature and suspect that there is neuropathic in nature which she should have evaluated in the near future noninvasively.  2: History of previous DVT- she was on Xarelto prior to her procedure which was held for close to 48 hours.  This can be restarted today.  She was not begun on Plavix because of her prior history of GI bleeding.  3: Essential hypertension- table on current medications  4: Coronary artery disease- stable on current medications, followed by Dr. Radford Pax.  For questions or updates, please contact [place] Please consult [email or web address] for  contact info under   Alejandra Hernandez was seen and examined by me.  She is postop day 1 right common iliac artery stenting with stable groins bilaterally.  Her serum creatinine is 1.5 today and prior to the procedure was 1.2 although her baseline seems to be around 1.4.  We will check a basic metabolic panel next week.  We will arrange for her to see Dr. Velva Harman back in the office in 2 to 3 weeks.  She can restart her Xarelto today.      Signed, Quay Burow, MD  10/21/2018, 8:37 AM

## 2018-10-21 NOTE — Discharge Instructions (Signed)

## 2018-10-25 ENCOUNTER — Telehealth: Payer: Self-pay | Admitting: *Deleted

## 2018-10-25 DIAGNOSIS — Z9189 Other specified personal risk factors, not elsewhere classified: Secondary | ICD-10-CM

## 2018-10-25 DIAGNOSIS — I25110 Atherosclerotic heart disease of native coronary artery with unstable angina pectoris: Secondary | ICD-10-CM

## 2018-10-25 DIAGNOSIS — I739 Peripheral vascular disease, unspecified: Secondary | ICD-10-CM

## 2018-10-25 NOTE — Telephone Encounter (Signed)
Order entered. Routing to scheduling. Needs done in [place]. Patient has appointment with Dr Fletcher Anon 11/02/18, please have done prior to that. Thanks!

## 2018-10-25 NOTE — Telephone Encounter (Signed)
Post procedure Aorta/Iliac and Abi ordered, per Dr. Fletcher Anon. LEA ordered has been cancelled due to patient needing an Aorta/iliac instead.  The duplex has been scheduled for 12/11 and the patient now has a follow up on 12/17 with Dr. Fletcher Anon.   Message left with the patient about her appointments and to call back with any questions.

## 2018-10-25 NOTE — Telephone Encounter (Signed)
Please call and schedule

## 2018-10-25 NOTE — Telephone Encounter (Signed)
Patient returning our call ° °Please call back ° °

## 2018-10-25 NOTE — Addendum Note (Signed)
Addended by: Ricci Barker on: 10/25/2018 02:59 PM   Modules accepted: Orders

## 2018-10-25 NOTE — Telephone Encounter (Signed)
-----   Message from Rise Mu, PA-C sent at 10/21/2018 10:18 AM EST ----- Can you please schedule this patient to have a follow up lower extremity arterial Duplex in [place] in 2 weeks. She sees Dr. Fletcher Anon at [place].

## 2018-10-27 ENCOUNTER — Encounter (HOSPITAL_COMMUNITY): Payer: Medicare Other

## 2018-10-28 ENCOUNTER — Encounter: Payer: Self-pay | Admitting: Endocrinology

## 2018-10-28 ENCOUNTER — Ambulatory Visit (INDEPENDENT_AMBULATORY_CARE_PROVIDER_SITE_OTHER): Payer: Medicare Other | Admitting: Endocrinology

## 2018-10-28 VITALS — BP 110/50 | HR 61 | Ht 63.0 in | Wt 159.8 lb

## 2018-10-28 DIAGNOSIS — Z794 Long term (current) use of insulin: Secondary | ICD-10-CM | POA: Diagnosis not present

## 2018-10-28 DIAGNOSIS — E118 Type 2 diabetes mellitus with unspecified complications: Secondary | ICD-10-CM | POA: Diagnosis not present

## 2018-10-28 LAB — POCT GLYCOSYLATED HEMOGLOBIN (HGB A1C): HEMOGLOBIN A1C: 12.8 % — AB (ref 4.0–5.6)

## 2018-10-28 MED ORDER — ACCU-CHEK AVIVA DEVI: 0 refills | Status: AC

## 2018-10-28 MED ORDER — INSULIN DEGLUDEC 200 UNIT/ML ~~LOC~~ SOPN: 120.0000 [IU] | PEN_INJECTOR | Freq: Every day | SUBCUTANEOUS | 11 refills | Status: DC

## 2018-10-28 MED ORDER — GLUCOSE BLOOD VI STRP: 1.0000 | ORAL_STRIP | Freq: Two times a day (BID) | 3 refills | Status: AC

## 2018-10-28 NOTE — Patient Instructions (Addendum)
check your blood sugar twice a day.  vary the time of day when you check, between before the 3 meals, and at bedtime.  also check if you have symptoms of your blood sugar being too high or too low.  please keep a record of the readings and bring it to your next appointment here (or you can bring the meter itself).  You can write it on any piece of paper.  please call us sooner if your blood sugar goes below 70, or if you have a lot of readings over 200.  Please change the lantus to "Tresiba."  You can take this at any time of day. We are trying to phase out the humalog.   On this type of insulin schedule, you should eat meals on a regular schedule.  If a meal is missed or significantly delayed, your blood sugar could go low.  You may need to eat a light snack at bedtime, to avoid the blood sugar from going low in the middle of the night.   I have sent a prescription to your pharmacy, for a new meter and strips.   Please come back for a follow-up appointment in 2 months.
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
## 2018-11-01 NOTE — Telephone Encounter (Signed)
The patient has been made aware of her appointments and the reason for the change. She has verbalized her understanding and confirmed the dates and times.

## 2018-11-01 NOTE — Telephone Encounter (Signed)
Patient returning call .  She is aware appt day and time and wants to know if there are any other instructions besides fasting

## 2018-11-02 ENCOUNTER — Ambulatory Visit: Payer: Medicare Other | Admitting: Cardiovascular Disease

## 2018-11-03 ENCOUNTER — Ambulatory Visit (HOSPITAL_BASED_OUTPATIENT_CLINIC_OR_DEPARTMENT_OTHER)
Admission: RE | Admit: 2018-11-03 | Discharge: 2018-11-03 | Disposition: A | Discharge status: Home / Self Care | Payer: Medicare Other | Source: Ambulatory Visit | Attending: Cardiovascular Disease | Admitting: Cardiovascular Disease

## 2018-11-03 ENCOUNTER — Ambulatory Visit (HOSPITAL_COMMUNITY)
Admission: RE | Admit: 2018-11-03 | Discharge: 2018-11-03 | Disposition: A | Discharge status: Home / Self Care | Payer: Medicare Other | Source: Ambulatory Visit | Attending: Cardiovascular Disease | Admitting: Cardiovascular Disease

## 2018-11-03 DIAGNOSIS — I739 Peripheral vascular disease, unspecified: Secondary | ICD-10-CM

## 2018-11-09 ENCOUNTER — Ambulatory Visit (INDEPENDENT_AMBULATORY_CARE_PROVIDER_SITE_OTHER): Payer: Medicare Other | Admitting: Cardiovascular Disease

## 2018-11-09 ENCOUNTER — Encounter: Payer: Self-pay | Admitting: Cardiovascular Disease

## 2018-11-09 VITALS — BP 130/60 | HR 74 | Ht 63.0 in | Wt 156.6 lb

## 2018-11-09 DIAGNOSIS — I5032 Chronic diastolic (congestive) heart failure: Secondary | ICD-10-CM

## 2018-11-09 DIAGNOSIS — I779 Disorder of arteries and arterioles, unspecified: Secondary | ICD-10-CM

## 2018-11-09 DIAGNOSIS — I739 Peripheral vascular disease, unspecified: Secondary | ICD-10-CM | POA: Diagnosis not present

## 2018-11-09 DIAGNOSIS — I2510 Atherosclerotic heart disease of native coronary artery without angina pectoris: Secondary | ICD-10-CM | POA: Diagnosis not present

## 2018-11-09 DIAGNOSIS — I10 Essential (primary) hypertension: Secondary | ICD-10-CM | POA: Diagnosis not present

## 2018-11-09 MED ORDER — CARVEDILOL 6.25 MG PO TABS: 6.2500 mg | ORAL_TABLET | Freq: Two times a day (BID) | ORAL | 3 refills | Status: DC
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
## 2018-11-09 NOTE — Patient Instructions (Addendum)
Medication Instructions:  Your physician has recommended you make the following change in your medication:   REDUCE Carvedilol (Coreg) to 6.25mg  daily.  If you need a refill on your cardiac medications before your next appointment, please call your pharmacy.   Lab work: None ordered If you have labs (blood work) drawn today and your tests are completely normal, you will receive your results only by: Marland Kitchen MyChart Message (if you have MyChart) OR . A paper copy in the mail If you have any lab test that is abnormal or we need to change your treatment, we will call you to review the results.  Testing/Procedures: Your physician has requested that you have an abdominal aorta duplex. During this test, an ultrasound is used to evaluate the aorta. Allow 30 minutes for this exam. Do not eat after midnight the day before and avoid carbonated beverages (To be scheduled in 6 months)  Your physician has requested that you have an ankle brachial index (ABI). During this test an ultrasound and blood pressure cuff are used to evaluate the arteries that supply the arms and legs with blood. Allow thirty minutes for this exam. There are no restrictions or special instructions. (To be scheduled in 6 months)    Follow-Up: At [facility], you and your health needs are our priority.  As part of our continuing mission to provide you with exceptional heart care, we have created designated Provider Care Teams.  These Care Teams include your primary Cardiologist (physician) and Advanced Practice Providers (APPs -  Physician Assistants and Nurse Practitioners) who all work together to provide you with the care you need, when you need it.  You will need a follow up appointment in 6 months with Dr.Arida.   Please call our office 2 months in advance to schedule this appointment.    Any Other Special Instructions Will Be Listed Below (If Applicable).
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
## 2018-11-16 ENCOUNTER — Emergency Department (HOSPITAL_COMMUNITY): Payer: Medicare Other

## 2018-11-16 ENCOUNTER — Encounter (HOSPITAL_COMMUNITY): Payer: Self-pay | Admitting: Emergency Medicine

## 2018-11-16 ENCOUNTER — Other Ambulatory Visit: Payer: Self-pay

## 2018-11-16 ENCOUNTER — Inpatient Hospital Stay (HOSPITAL_COMMUNITY)
Admission: EM | Admit: 2018-11-16 | Discharge: 2018-11-19 | DRG: 392 | Disposition: A | Discharge status: Home / Self Care | Payer: Medicare Other | Attending: Internal Medicine | Admitting: Internal Medicine

## 2018-11-16 DIAGNOSIS — Z955 Presence of coronary angioplasty implant and graft: Secondary | ICD-10-CM

## 2018-11-16 DIAGNOSIS — I25119 Atherosclerotic heart disease of native coronary artery with unspecified angina pectoris: Secondary | ICD-10-CM | POA: Diagnosis present

## 2018-11-16 DIAGNOSIS — R7989 Other specified abnormal findings of blood chemistry: Secondary | ICD-10-CM | POA: Diagnosis present

## 2018-11-16 DIAGNOSIS — R0789 Other chest pain: Secondary | ICD-10-CM | POA: Diagnosis not present

## 2018-11-16 DIAGNOSIS — Z833 Family history of diabetes mellitus: Secondary | ICD-10-CM

## 2018-11-16 DIAGNOSIS — Z7982 Long term (current) use of aspirin: Secondary | ICD-10-CM

## 2018-11-16 DIAGNOSIS — K210 Gastro-esophageal reflux disease with esophagitis: Secondary | ICD-10-CM | POA: Diagnosis present

## 2018-11-16 DIAGNOSIS — Z86718 Personal history of other venous thrombosis and embolism: Secondary | ICD-10-CM

## 2018-11-16 DIAGNOSIS — N183 Chronic kidney disease, stage 3 (moderate): Secondary | ICD-10-CM | POA: Diagnosis present

## 2018-11-16 DIAGNOSIS — E1010 Type 1 diabetes mellitus with ketoacidosis without coma: Secondary | ICD-10-CM

## 2018-11-16 DIAGNOSIS — Z87891 Personal history of nicotine dependence: Secondary | ICD-10-CM

## 2018-11-16 DIAGNOSIS — A084 Viral intestinal infection, unspecified: Secondary | ICD-10-CM | POA: Diagnosis not present

## 2018-11-16 DIAGNOSIS — R112 Nausea with vomiting, unspecified: Secondary | ICD-10-CM | POA: Diagnosis present

## 2018-11-16 DIAGNOSIS — Z794 Long term (current) use of insulin: Secondary | ICD-10-CM

## 2018-11-16 DIAGNOSIS — F329 Major depressive disorder, single episode, unspecified: Secondary | ICD-10-CM | POA: Diagnosis present

## 2018-11-16 DIAGNOSIS — I214 Non-ST elevation (NSTEMI) myocardial infarction: Secondary | ICD-10-CM

## 2018-11-16 DIAGNOSIS — I160 Hypertensive urgency: Secondary | ICD-10-CM | POA: Diagnosis not present

## 2018-11-16 DIAGNOSIS — Z8249 Family history of ischemic heart disease and other diseases of the circulatory system: Secondary | ICD-10-CM

## 2018-11-16 DIAGNOSIS — E785 Hyperlipidemia, unspecified: Secondary | ICD-10-CM | POA: Diagnosis present

## 2018-11-16 DIAGNOSIS — R778 Other specified abnormalities of plasma proteins: Secondary | ICD-10-CM | POA: Diagnosis present

## 2018-11-16 DIAGNOSIS — Z79899 Other long term (current) drug therapy: Secondary | ICD-10-CM

## 2018-11-16 DIAGNOSIS — E1051 Type 1 diabetes mellitus with diabetic peripheral angiopathy without gangrene: Secondary | ICD-10-CM | POA: Diagnosis present

## 2018-11-16 DIAGNOSIS — G8929 Other chronic pain: Secondary | ICD-10-CM | POA: Diagnosis present

## 2018-11-16 DIAGNOSIS — M1990 Unspecified osteoarthritis, unspecified site: Secondary | ICD-10-CM | POA: Diagnosis present

## 2018-11-16 DIAGNOSIS — M545 Low back pain: Secondary | ICD-10-CM | POA: Diagnosis present

## 2018-11-16 DIAGNOSIS — I2510 Atherosclerotic heart disease of native coronary artery without angina pectoris: Secondary | ICD-10-CM | POA: Diagnosis not present

## 2018-11-16 DIAGNOSIS — R739 Hyperglycemia, unspecified: Secondary | ICD-10-CM

## 2018-11-16 DIAGNOSIS — I252 Old myocardial infarction: Secondary | ICD-10-CM

## 2018-11-16 DIAGNOSIS — R072 Precordial pain: Secondary | ICD-10-CM | POA: Diagnosis not present

## 2018-11-16 DIAGNOSIS — F419 Anxiety disorder, unspecified: Secondary | ICD-10-CM | POA: Diagnosis present

## 2018-11-16 DIAGNOSIS — E1022 Type 1 diabetes mellitus with diabetic chronic kidney disease: Secondary | ICD-10-CM | POA: Diagnosis present

## 2018-11-16 DIAGNOSIS — R079 Chest pain, unspecified: Secondary | ICD-10-CM | POA: Diagnosis present

## 2018-11-16 DIAGNOSIS — Z7901 Long term (current) use of anticoagulants: Secondary | ICD-10-CM

## 2018-11-16 DIAGNOSIS — M109 Gout, unspecified: Secondary | ICD-10-CM | POA: Diagnosis present

## 2018-11-16 DIAGNOSIS — D631 Anemia in chronic kidney disease: Secondary | ICD-10-CM | POA: Diagnosis present

## 2018-11-16 DIAGNOSIS — Z8711 Personal history of peptic ulcer disease: Secondary | ICD-10-CM

## 2018-11-16 LAB — COMPREHENSIVE METABOLIC PANEL
ALT: 85 U/L — ABNORMAL HIGH (ref 0–44)
AST: 38 U/L (ref 15–41)
Albumin: 3.2 g/dL — ABNORMAL LOW (ref 3.5–5.0)
Alkaline Phosphatase: 111 U/L (ref 38–126)
Anion gap: 12 (ref 5–15)
BILIRUBIN TOTAL: 1.3 mg/dL — AB (ref 0.3–1.2)
BUN: 24 mg/dL — ABNORMAL HIGH (ref 8–23)
CHLORIDE: 108 mmol/L (ref 98–111)
CO2: 20 mmol/L — ABNORMAL LOW (ref 22–32)
Calcium: 8.9 mg/dL (ref 8.9–10.3)
Creatinine, Ser: 1.3 mg/dL — ABNORMAL HIGH (ref 0.44–1.00)
GFR calc Af Amer: 51 mL/min — ABNORMAL LOW (ref >60)
GFR, EST NON AFRICAN AMERICAN: 44 mL/min — AB (ref >60)
Glucose, Bld: 233 mg/dL — ABNORMAL HIGH (ref 70–99)
POTASSIUM: 4.9 mmol/L (ref 3.5–5.1)
Sodium: 140 mmol/L (ref 135–145)
TOTAL PROTEIN: 7.2 g/dL (ref 6.5–8.1)

## 2018-11-16 LAB — I-STAT TROPONIN, ED
TROPONIN I, POC: 0.07 ng/mL (ref 0.00–0.08)
TROPONIN I, POC: 0.14 ng/mL — AB (ref 0.00–0.08)

## 2018-11-16 LAB — CBC WITH DIFFERENTIAL/PLATELET
Abs Immature Granulocytes: 0 10*3/uL (ref 0.00–0.07)
Basophils Absolute: 0 10*3/uL (ref 0.0–0.1)
Basophils Relative: 0 %
EOS ABS: 0 10*3/uL (ref 0.0–0.5)
EOS PCT: 0 %
HEMATOCRIT: 47.5 % — AB (ref 36.0–46.0)
Hemoglobin: 13.9 g/dL (ref 12.0–15.0)
Lymphocytes Relative: 22 %
Lymphs Abs: 4.5 10*3/uL — ABNORMAL HIGH (ref 0.7–4.0)
MCH: 20 pg — AB (ref 26.0–34.0)
MCHC: 29.3 g/dL — AB (ref 30.0–36.0)
MCV: 68.4 fL — AB (ref 80.0–100.0)
MONO ABS: 0.8 10*3/uL (ref 0.1–1.0)
Monocytes Relative: 4 %
NEUTROS ABS: 15.2 10*3/uL — AB (ref 1.7–7.7)
Neutrophils Relative %: 74 %
PLATELETS: 241 10*3/uL (ref 150–400)
RBC: 6.94 MIL/uL — AB (ref 3.87–5.11)
RDW: 19.6 % — AB (ref 11.5–15.5)
WBC: 20.5 10*3/uL — AB (ref 4.0–10.5)
nRBC: 0 % (ref 0.0–0.2)
nRBC: 0 /100 WBC

## 2018-11-16 LAB — BRAIN NATRIURETIC PEPTIDE: B Natriuretic Peptide: 106.4 pg/mL — ABNORMAL HIGH (ref 0.0–100.0)

## 2018-11-16 LAB — GLUCOSE, CAPILLARY: Glucose-Capillary: 172 mg/dL — ABNORMAL HIGH (ref 70–99)

## 2018-11-16 LAB — LIPASE, BLOOD: Lipase: 32 U/L (ref 11–51)

## 2018-11-16 LAB — TROPONIN I: Troponin I: 0.1 ng/mL (ref <0.03)

## 2018-11-16 LAB — MRSA PCR SCREENING: MRSA by PCR: NEGATIVE

## 2018-11-16 MED ORDER — AMLODIPINE BESYLATE 10 MG PO TABS
10.0000 mg | ORAL_TABLET | Freq: Every day | ORAL | Status: DC
  Administered 2018-11-16 – 2018-11-18 (×3): 10 mg via ORAL
  Filled 2018-11-16 (×3): qty 1

## 2018-11-16 MED ORDER — INSULIN GLARGINE 100 UNIT/ML ~~LOC~~ SOLN
80.0000 [IU] | Freq: Every day | SUBCUTANEOUS | Status: DC
  Filled 2018-11-16: qty 0.8

## 2018-11-16 MED ORDER — IPRATROPIUM-ALBUTEROL 0.5-2.5 (3) MG/3ML IN SOLN: 3.0000 mL | Freq: Four times a day (QID) | RESPIRATORY_TRACT | Status: DC | PRN

## 2018-11-16 MED ORDER — GABAPENTIN 600 MG PO TABS
300.0000 mg | ORAL_TABLET | Freq: Three times a day (TID) | ORAL | Status: DC
  Administered 2018-11-16 – 2018-11-19 (×8): 300 mg via ORAL
  Filled 2018-11-16 (×8): qty 1

## 2018-11-16 MED ORDER — ONDANSETRON HCL 4 MG/2ML IJ SOLN
4.0000 mg | Freq: Once | INTRAMUSCULAR | Status: AC
  Administered 2018-11-16: 4 mg via INTRAVENOUS
  Filled 2018-11-16: qty 2

## 2018-11-16 MED ORDER — CARVEDILOL 6.25 MG PO TABS
6.2500 mg | ORAL_TABLET | Freq: Two times a day (BID) | ORAL | Status: DC
  Administered 2018-11-16 – 2018-11-19 (×5): 6.25 mg via ORAL
  Filled 2018-11-16 (×5): qty 1

## 2018-11-16 MED ORDER — HEPARIN (PORCINE) 25000 UT/250ML-% IV SOLN
700.0000 [IU]/h | INTRAVENOUS | Status: DC
  Administered 2018-11-16: 800 [IU]/h via INTRAVENOUS
  Administered 2018-11-18: 700 [IU]/h via INTRAVENOUS
  Filled 2018-11-16 (×2): qty 250

## 2018-11-16 MED ORDER — ASPIRIN 81 MG PO CHEW
81.0000 mg | CHEWABLE_TABLET | Freq: Every day | ORAL | Status: DC
  Administered 2018-11-17 – 2018-11-19 (×3): 81 mg via ORAL
  Filled 2018-11-16 (×3): qty 1

## 2018-11-16 MED ORDER — RIVAROXABAN 20 MG PO TABS
20.0000 mg | ORAL_TABLET | Freq: Every day | ORAL | Status: DC
  Filled 2018-11-16: qty 1

## 2018-11-16 MED ORDER — MORPHINE SULFATE (PF) 4 MG/ML IV SOLN
4.0000 mg | Freq: Once | INTRAVENOUS | Status: AC
  Administered 2018-11-16: 4 mg via INTRAVENOUS
  Filled 2018-11-16: qty 1

## 2018-11-16 MED ORDER — NITROGLYCERIN 0.4 MG SL SUBL: 0.4000 mg | SUBLINGUAL_TABLET | SUBLINGUAL | Status: DC | PRN

## 2018-11-16 MED ORDER — ALPRAZOLAM 0.25 MG PO TABS
0.5000 mg | ORAL_TABLET | Freq: Every day | ORAL | Status: DC
  Administered 2018-11-17 – 2018-11-19 (×3): 0.5 mg via ORAL
  Filled 2018-11-16 (×3): qty 2

## 2018-11-16 MED ORDER — INSULIN ASPART 100 UNIT/ML ~~LOC~~ SOLN
0.0000 [IU] | Freq: Three times a day (TID) | SUBCUTANEOUS | Status: DC
  Administered 2018-11-17 (×2): 2 [IU] via SUBCUTANEOUS
  Administered 2018-11-18: 1 [IU] via SUBCUTANEOUS
  Administered 2018-11-18: 2 [IU] via SUBCUTANEOUS
  Administered 2018-11-19: 1 [IU] via SUBCUTANEOUS

## 2018-11-16 MED ORDER — PANTOPRAZOLE SODIUM 40 MG PO TBEC
40.0000 mg | DELAYED_RELEASE_TABLET | Freq: Every day | ORAL | Status: DC
  Administered 2018-11-17 – 2018-11-19 (×3): 40 mg via ORAL
  Filled 2018-11-16 (×3): qty 1

## 2018-11-16 MED ORDER — CLONIDINE HCL 0.2 MG PO TABS
0.2000 mg | ORAL_TABLET | Freq: Two times a day (BID) | ORAL | Status: DC
  Administered 2018-11-16 – 2018-11-19 (×6): 0.2 mg via ORAL
  Filled 2018-11-16 (×6): qty 1

## 2018-11-16 MED ORDER — METOPROLOL TARTRATE 5 MG/5ML IV SOLN
5.0000 mg | Freq: Once | INTRAVENOUS | Status: AC
  Administered 2018-11-16: 5 mg via INTRAVENOUS
  Filled 2018-11-16: qty 5

## 2018-11-16 MED ORDER — INSULIN GLARGINE 100 UNIT/ML ~~LOC~~ SOLN
30.0000 [IU] | Freq: Every day | SUBCUTANEOUS | Status: DC
  Administered 2018-11-16 – 2018-11-17 (×2): 30 [IU] via SUBCUTANEOUS
  Filled 2018-11-16 (×2): qty 0.3

## 2018-11-16 MED ORDER — FUROSEMIDE 20 MG PO TABS
20.0000 mg | ORAL_TABLET | Freq: Every day | ORAL | Status: DC
  Administered 2018-11-17: 20 mg via ORAL
  Filled 2018-11-16: qty 1

## 2018-11-16 MED ORDER — ISOSORBIDE MONONITRATE ER 60 MG PO TB24
90.0000 mg | ORAL_TABLET | Freq: Every day | ORAL | Status: DC
  Administered 2018-11-16 – 2018-11-19 (×4): 90 mg via ORAL
  Filled 2018-11-16 (×4): qty 1

## 2018-11-16 MED ORDER — ONDANSETRON HCL 4 MG/2ML IJ SOLN
4.0000 mg | Freq: Four times a day (QID) | INTRAMUSCULAR | Status: DC | PRN
  Administered 2018-11-16 – 2018-11-18 (×2): 4 mg via INTRAVENOUS
  Filled 2018-11-16 (×2): qty 2

## 2018-11-16 MED ORDER — HEPARIN BOLUS VIA INFUSION: 4000.0000 [IU] | Freq: Once | INTRAVENOUS | Status: DC

## 2018-11-16 MED ORDER — IOPAMIDOL (ISOVUE-370) INJECTION 76%
INTRAVENOUS | Status: AC
  Administered 2018-11-16: 100 mL
  Filled 2018-11-16: qty 100

## 2018-11-16 MED ORDER — HYDRALAZINE HCL 20 MG/ML IJ SOLN
10.0000 mg | Freq: Once | INTRAMUSCULAR | Status: DC
  Filled 2018-11-16: qty 1

## 2018-11-16 MED ORDER — EZETIMIBE 10 MG PO TABS
10.0000 mg | ORAL_TABLET | Freq: Every day | ORAL | Status: DC
  Administered 2018-11-16 – 2018-11-18 (×3): 10 mg via ORAL
  Filled 2018-11-16 (×3): qty 1

## 2018-11-16 MED ORDER — SODIUM CHLORIDE 0.9 % IV BOLUS: 1000.0000 mL | Freq: Once | INTRAVENOUS | Status: DC

## 2018-11-16 NOTE — ED Triage Notes (Signed)
Patient arrived from home via [facility] reporting back pain that started last night. She reports that this back pain felt like when she had a heart attack, she also reports taking percocet at home for her back pain without result. [facility] reports that patient was given 4mg  Zofran IM related to nausea. VITALS: BP:180/90; CBG: 330
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
## 2018-11-16 NOTE — Progress Notes (Signed)
ANTICOAGULATION CONSULT NOTE - Initial Consult  Pharmacy Consult for heparin Indication: chest pain/ACS  No Known Allergies  Patient Measurements:   Heparin Dosing Weight: 67.2  Vital Signs: Temp: 98.1 F (36.7 C) (12/24 1549) Temp Source: Oral (12/24 1549) BP: 155/72 (12/24 1915) Pulse Rate: 73 (12/24 1900)  Labs: Recent Labs    11/16/18 1403 11/16/18 1822  HGB 13.9  --   HCT 47.5*  --   PLT 241  --   CREATININE 1.30*  --   TROPONINI  --  0.10*    Estimated Creatinine Clearance: 42.4 mL/min (A) (by C-G formula based on SCr of 1.3 mg/dL (H)).   Medical History: Past Medical History:  Diagnosis Date  . Anemia   . Anxiety   . Arthritis    "right knee, back, feet, hands" (10/20/2018)  . Bleeding stomach ulcer 01/2016  . CAD (coronary artery disease)    05/19/17 PCI with DESx1 to [facility]  . Carotid artery disease (HCC)    40-59% right and 1-39% left by doppler 05/2018 with stenotic right subclavian artery  . Chronic lower back pain   . CKD (chronic kidney disease) stage 3, GFR 30-59 ml/min (HCC) 01/2016  . Depression   . Diabetic peripheral neuropathy ([place])   . DVT (deep venous thrombosis) ([place]) 01/2017   BLE  . GERD (gastroesophageal reflux disease)   . Gout   . Headache    "weekly" (11/27//2019)  . Heart murmur   . History of blood transfusion 2017   "related to stomach bleeding"  . Hyperlipidemia   . Hypertension   . NSTEMI (non-ST elevated myocardial infarction) ([place]) 05/2017  . PAD (peripheral artery disease) ([place])    a. ABIs 7/17: R 0.73, L 0.64, waveforms suggest bilateral SFA disease b. 07/30/16 balloon angioplasty to left SFA with atherectomy   . Pneumonia    "several times" (10/20/2018)  . Type II diabetes mellitus (HCC)     Assessment: 62 yo F admitted for NSTEMI. Pharmacy consulted to start heparin. On Xarelto PTA, last dose taken yesterday. CBC WNL  Goal of Therapy:  APTT goal: 66-102 Heparin level 0.3-0.7 units/ml Monitor platelets by  anticoagulation protocol: Yes   Plan:  Start Heparin gtt at 800 units/ml  6 hr Heparin level/APTT Daily CBC, heparin level, APTT while on heparin gtt  Harrietta Guardian, PharmD PGY1 Pharmacy Resident 11/16/2018    7:59 PM Please check AMION for all [place] numbers

## 2018-11-16 NOTE — ED Notes (Signed)
Patient denies pain and is resting comfortably.  

## 2018-11-16 NOTE — ED Notes (Signed)
Admitting team at bedside.

## 2018-11-16 NOTE — ED Notes (Signed)
Cards at bedside

## 2018-11-16 NOTE — ED Notes (Signed)
Patient's heart rate increased to 130s-140s She denies chest pain, reports feeling "tired."

## 2018-11-16 NOTE — ED Notes (Signed)
Patient transported to CT 

## 2018-11-16 NOTE — ED Notes (Signed)
Returned from CT.

## 2018-11-17 ENCOUNTER — Observation Stay (HOSPITAL_BASED_OUTPATIENT_CLINIC_OR_DEPARTMENT_OTHER): Payer: Medicare Other

## 2018-11-17 ENCOUNTER — Encounter (HOSPITAL_COMMUNITY): Payer: Self-pay | Admitting: *Deleted

## 2018-11-17 DIAGNOSIS — I214 Non-ST elevation (NSTEMI) myocardial infarction: Secondary | ICD-10-CM | POA: Diagnosis not present

## 2018-11-17 DIAGNOSIS — R7989 Other specified abnormal findings of blood chemistry: Secondary | ICD-10-CM | POA: Diagnosis not present

## 2018-11-17 DIAGNOSIS — R0789 Other chest pain: Secondary | ICD-10-CM | POA: Diagnosis not present

## 2018-11-17 DIAGNOSIS — R112 Nausea with vomiting, unspecified: Secondary | ICD-10-CM | POA: Diagnosis not present

## 2018-11-17 DIAGNOSIS — R197 Diarrhea, unspecified: Secondary | ICD-10-CM

## 2018-11-17 DIAGNOSIS — I248 Other forms of acute ischemic heart disease: Secondary | ICD-10-CM | POA: Diagnosis not present

## 2018-11-17 DIAGNOSIS — R072 Precordial pain: Secondary | ICD-10-CM

## 2018-11-17 DIAGNOSIS — E785 Hyperlipidemia, unspecified: Secondary | ICD-10-CM

## 2018-11-17 DIAGNOSIS — I25118 Atherosclerotic heart disease of native coronary artery with other forms of angina pectoris: Secondary | ICD-10-CM

## 2018-11-17 DIAGNOSIS — D72829 Elevated white blood cell count, unspecified: Secondary | ICD-10-CM

## 2018-11-17 DIAGNOSIS — I10 Essential (primary) hypertension: Secondary | ICD-10-CM | POA: Diagnosis not present

## 2018-11-17 LAB — ECHOCARDIOGRAM COMPLETE
Height: 63 in
Weight: 2324.53 oz

## 2018-11-17 LAB — HEPARIN LEVEL (UNFRACTIONATED)
Heparin Unfractionated: 0.76 IU/mL — ABNORMAL HIGH (ref 0.30–0.70)
Heparin Unfractionated: 0.74 IU/mL — ABNORMAL HIGH (ref 0.30–0.70)

## 2018-11-17 LAB — TROPONIN I
TROPONIN I: 0.31 ng/mL — AB (ref <0.03)
TROPONIN I: 0.21 ng/mL — AB (ref <0.03)
TROPONIN I: 0.24 ng/mL — AB (ref <0.03)
Troponin I: 0.28 ng/mL (ref <0.03)

## 2018-11-17 LAB — BASIC METABOLIC PANEL
Anion gap: 14 (ref 5–15)
BUN: 31 mg/dL — ABNORMAL HIGH (ref 8–23)
CO2: 20 mmol/L — AB (ref 22–32)
Calcium: 8.7 mg/dL — ABNORMAL LOW (ref 8.9–10.3)
Chloride: 107 mmol/L (ref 98–111)
Creatinine, Ser: 1.89 mg/dL — ABNORMAL HIGH (ref 0.44–1.00)
GFR calc Af Amer: 32 mL/min — ABNORMAL LOW (ref >60)
GFR calc non Af Amer: 28 mL/min — ABNORMAL LOW (ref >60)
Glucose, Bld: 86 mg/dL (ref 70–99)
Potassium: 3.8 mmol/L (ref 3.5–5.1)
Sodium: 141 mmol/L (ref 135–145)

## 2018-11-17 LAB — CBC WITH DIFFERENTIAL/PLATELET
ABS IMMATURE GRANULOCYTES: 0.09 10*3/uL — AB (ref 0.00–0.07)
Basophils Absolute: 0 10*3/uL (ref 0.0–0.1)
Basophils Relative: 0 %
Eosinophils Absolute: 0 10*3/uL (ref 0.0–0.5)
Eosinophils Relative: 0 %
HEMATOCRIT: 46.9 % — AB (ref 36.0–46.0)
HEMOGLOBIN: 13.9 g/dL (ref 12.0–15.0)
Immature Granulocytes: 1 %
Lymphocytes Relative: 36 %
Lymphs Abs: 6.2 10*3/uL — ABNORMAL HIGH (ref 0.7–4.0)
MCH: 20.2 pg — ABNORMAL LOW (ref 26.0–34.0)
MCHC: 29.6 g/dL — ABNORMAL LOW (ref 30.0–36.0)
MCV: 68.1 fL — ABNORMAL LOW (ref 80.0–100.0)
MONO ABS: 1 10*3/uL (ref 0.1–1.0)
Monocytes Relative: 6 %
Neutro Abs: 10 10*3/uL — ABNORMAL HIGH (ref 1.7–7.7)
Neutrophils Relative %: 57 %
Platelets: 234 10*3/uL (ref 150–400)
RBC: 6.89 MIL/uL — ABNORMAL HIGH (ref 3.87–5.11)
RDW: 19.7 % — ABNORMAL HIGH (ref 11.5–15.5)
WBC: 17.4 10*3/uL — ABNORMAL HIGH (ref 4.0–10.5)
nRBC: 0 % (ref 0.0–0.2)

## 2018-11-17 LAB — GLUCOSE, CAPILLARY
GLUCOSE-CAPILLARY: 187 mg/dL — AB (ref 70–99)
Glucose-Capillary: 70 mg/dL (ref 70–99)
Glucose-Capillary: 169 mg/dL — ABNORMAL HIGH (ref 70–99)
Glucose-Capillary: 178 mg/dL — ABNORMAL HIGH (ref 70–99)

## 2018-11-17 LAB — COMPREHENSIVE METABOLIC PANEL
ALT: 63 U/L — ABNORMAL HIGH (ref 0–44)
ANION GAP: 13 (ref 5–15)
AST: 53 U/L — ABNORMAL HIGH (ref 15–41)
Albumin: 2.6 g/dL — ABNORMAL LOW (ref 3.5–5.0)
Alkaline Phosphatase: 88 U/L (ref 38–126)
BUN: 39 mg/dL — ABNORMAL HIGH (ref 8–23)
CO2: 22 mmol/L (ref 22–32)
Calcium: 8.3 mg/dL — ABNORMAL LOW (ref 8.9–10.3)
Chloride: 104 mmol/L (ref 98–111)
Creatinine, Ser: 2.12 mg/dL — ABNORMAL HIGH (ref 0.44–1.00)
GFR, EST AFRICAN AMERICAN: 28 mL/min — AB (ref >60)
GFR, EST NON AFRICAN AMERICAN: 24 mL/min — AB (ref >60)
Glucose, Bld: 151 mg/dL — ABNORMAL HIGH (ref 70–99)
Potassium: 3.5 mmol/L (ref 3.5–5.1)
Sodium: 139 mmol/L (ref 135–145)
Total Bilirubin: 0.9 mg/dL (ref 0.3–1.2)
Total Protein: 6.1 g/dL — ABNORMAL LOW (ref 6.5–8.1)

## 2018-11-17 LAB — APTT
aPTT: 61 seconds — ABNORMAL HIGH (ref 24–36)
aPTT: 91 seconds — ABNORMAL HIGH (ref 24–36)
aPTT: 96 seconds — ABNORMAL HIGH (ref 24–36)

## 2018-11-17 LAB — MAGNESIUM: Magnesium: 2 mg/dL (ref 1.7–2.4)

## 2018-11-17 MED ORDER — ACETAMINOPHEN 325 MG PO TABS: 650.0000 mg | ORAL_TABLET | Freq: Four times a day (QID) | ORAL | Status: DC | PRN

## 2018-11-17 MED ORDER — DICLOFENAC SODIUM 1 % TD GEL
2.0000 g | Freq: Four times a day (QID) | TRANSDERMAL | Status: DC
  Administered 2018-11-17 – 2018-11-19 (×8): 2 g via TOPICAL
  Filled 2018-11-17: qty 100

## 2018-11-17 NOTE — Progress Notes (Signed)
ANTICOAGULATION CONSULT NOTE - Initial Consult  Pharmacy Consult for heparin Indication: chest pain/ACS  No Known Allergies  Patient Measurements: Height: 5\' 3"  (160 cm) Weight: 145 lb 4.5 oz (65.9 kg) IBW/kg (Calculated) : 52.4 Heparin Dosing Weight: 67.2  Vital Signs: Temp: 98.5 F (36.9 C) (12/25 0300) Temp Source: Oral (12/25 0300) BP: 120/86 (12/25 0500) Pulse Rate: 70 (12/25 0500)  Labs: Recent Labs    11/16/18 1403 11/16/18 1822 11/17/18 0050 11/17/18 0552  HGB 13.9  --   --  13.9  HCT 47.5*  --   --  46.9*  PLT 241  --   --  234  APTT  --   --  61* 91*  HEPARINUNFRC  --   --   --  0.76*  CREATININE 1.30*  --   --   --   TROPONINI  --  0.10* 0.24*  --     Estimated Creatinine Clearance: 40.9 mL/min (A) (by C-G formula based on SCr of 1.3 mg/dL (H)).  Assessment: 62 y.o. female admitted with possible ACS, h/o DVT and Xarelto on hold, for heparin  Goal of Therapy:  APTT goal: 66-102 Heparin level 0.3-0.7 units/ml Monitor platelets by anticoagulation protocol: Yes   Plan:  Continue Heparin at current rate   Phillis Knack, PharmD, BCPS
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
## 2018-11-17 NOTE — Progress Notes (Signed)
ANTICOAGULATION CONSULT NOTE   Pharmacy Consult for heparin Indication: chest pain/ACS  No Known Allergies  Patient Measurements: Height: 5\' 3"  (160 cm) Weight: 145 lb 4.5 oz (65.9 kg) IBW/kg (Calculated) : 52.4 Heparin Dosing Weight: 67.2  Vital Signs: Temp: 98.3 F (36.8 C) (12/25 1158) Temp Source: Axillary (12/25 1158) BP: 110/63 (12/25 1158) Pulse Rate: 63 (12/25 1158)  Labs: Recent Labs    11/16/18 1403  11/17/18 0050 11/17/18 0552 11/17/18 1218  HGB 13.9  --   --  13.9  --   HCT 47.5*  --   --  46.9*  --   PLT 241  --   --  234  --   APTT  --   --  61* 91* 96*  HEPARINUNFRC  --   --   --  0.76* 0.74*  CREATININE 1.30*  --   --  1.89*  --   TROPONINI  --    < > 0.24* 0.21* 0.31*   < > = values in this interval not displayed.    Estimated Creatinine Clearance: 28.2 mL/min (A) (by C-G formula based on SCr of 1.89 mg/dL (H)).  Assessment: 62 y.o. female admitted with possible ACS, h/o DVT and Xarelto on hold, for heparin.   Heparin level still slightly elevated falsely at 0.74, aPTT therapeutic at 96, on 800 units/hr. Hgb 13.9, plt 234. No s/sx of bleeding. No infusion issues.   Goal of Therapy:  APTT goal: 66-102 Heparin level 0.3-0.7 units/ml Monitor platelets by anticoagulation protocol: Yes   Plan:  Continue heparin infusion at 800 units/hr Monitor daily HL, aPTT, and CBC  Antonietta Jewel, PharmD, [place] Clinical Pharmacist  Pager: [phone] Phone: [phone]
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
## 2018-11-17 NOTE — Progress Notes (Signed)
Pt complaining of back pain on left mid posterior. Pt given Voltaren transdermal gel for back pain, while applying gel I could feel a "knot"/bump in the area stated to have pain. Gave pt a warm compress for muscle pain.

## 2018-11-17 NOTE — Progress Notes (Signed)
  Echocardiogram 2D Echocardiogram has been performed.  Alejandra Hernandez 11/17/2018, 3:18 PM

## 2018-11-18 ENCOUNTER — Encounter (HOSPITAL_COMMUNITY): Payer: Self-pay

## 2018-11-18 DIAGNOSIS — M109 Gout, unspecified: Secondary | ICD-10-CM | POA: Diagnosis present

## 2018-11-18 DIAGNOSIS — A084 Viral intestinal infection, unspecified: Secondary | ICD-10-CM | POA: Diagnosis present

## 2018-11-18 DIAGNOSIS — Z79899 Other long term (current) drug therapy: Secondary | ICD-10-CM | POA: Diagnosis not present

## 2018-11-18 DIAGNOSIS — I10 Essential (primary) hypertension: Secondary | ICD-10-CM | POA: Diagnosis not present

## 2018-11-18 DIAGNOSIS — N183 Chronic kidney disease, stage 3 (moderate): Secondary | ICD-10-CM | POA: Diagnosis present

## 2018-11-18 DIAGNOSIS — I160 Hypertensive urgency: Secondary | ICD-10-CM | POA: Diagnosis present

## 2018-11-18 DIAGNOSIS — F329 Major depressive disorder, single episode, unspecified: Secondary | ICD-10-CM | POA: Diagnosis present

## 2018-11-18 DIAGNOSIS — E1010 Type 1 diabetes mellitus with ketoacidosis without coma: Secondary | ICD-10-CM | POA: Diagnosis not present

## 2018-11-18 DIAGNOSIS — Z86718 Personal history of other venous thrombosis and embolism: Secondary | ICD-10-CM | POA: Diagnosis not present

## 2018-11-18 DIAGNOSIS — K210 Gastro-esophageal reflux disease with esophagitis: Secondary | ICD-10-CM | POA: Diagnosis present

## 2018-11-18 DIAGNOSIS — E785 Hyperlipidemia, unspecified: Secondary | ICD-10-CM | POA: Diagnosis present

## 2018-11-18 DIAGNOSIS — R112 Nausea with vomiting, unspecified: Secondary | ICD-10-CM | POA: Diagnosis present

## 2018-11-18 DIAGNOSIS — Z8711 Personal history of peptic ulcer disease: Secondary | ICD-10-CM | POA: Diagnosis not present

## 2018-11-18 DIAGNOSIS — Z955 Presence of coronary angioplasty implant and graft: Secondary | ICD-10-CM | POA: Diagnosis not present

## 2018-11-18 DIAGNOSIS — R072 Precordial pain: Secondary | ICD-10-CM | POA: Diagnosis present

## 2018-11-18 DIAGNOSIS — R7989 Other specified abnormal findings of blood chemistry: Secondary | ICD-10-CM | POA: Diagnosis not present

## 2018-11-18 DIAGNOSIS — M545 Low back pain: Secondary | ICD-10-CM | POA: Diagnosis present

## 2018-11-18 DIAGNOSIS — E1051 Type 1 diabetes mellitus with diabetic peripheral angiopathy without gangrene: Secondary | ICD-10-CM | POA: Diagnosis present

## 2018-11-18 DIAGNOSIS — F419 Anxiety disorder, unspecified: Secondary | ICD-10-CM | POA: Diagnosis present

## 2018-11-18 DIAGNOSIS — I252 Old myocardial infarction: Secondary | ICD-10-CM | POA: Diagnosis not present

## 2018-11-18 DIAGNOSIS — R0789 Other chest pain: Secondary | ICD-10-CM | POA: Diagnosis present

## 2018-11-18 DIAGNOSIS — D631 Anemia in chronic kidney disease: Secondary | ICD-10-CM | POA: Diagnosis present

## 2018-11-18 DIAGNOSIS — Z7901 Long term (current) use of anticoagulants: Secondary | ICD-10-CM | POA: Diagnosis not present

## 2018-11-18 DIAGNOSIS — G8929 Other chronic pain: Secondary | ICD-10-CM | POA: Diagnosis present

## 2018-11-18 DIAGNOSIS — Z794 Long term (current) use of insulin: Secondary | ICD-10-CM | POA: Diagnosis not present

## 2018-11-18 DIAGNOSIS — Z7982 Long term (current) use of aspirin: Secondary | ICD-10-CM | POA: Diagnosis not present

## 2018-11-18 DIAGNOSIS — I25119 Atherosclerotic heart disease of native coronary artery with unspecified angina pectoris: Secondary | ICD-10-CM | POA: Diagnosis present

## 2018-11-18 DIAGNOSIS — I248 Other forms of acute ischemic heart disease: Secondary | ICD-10-CM | POA: Diagnosis not present

## 2018-11-18 DIAGNOSIS — E1022 Type 1 diabetes mellitus with diabetic chronic kidney disease: Secondary | ICD-10-CM | POA: Diagnosis present

## 2018-11-18 DIAGNOSIS — M1990 Unspecified osteoarthritis, unspecified site: Secondary | ICD-10-CM | POA: Diagnosis present

## 2018-11-18 LAB — MAGNESIUM: Magnesium: 2 mg/dL (ref 1.7–2.4)

## 2018-11-18 LAB — CBC WITH DIFFERENTIAL/PLATELET
Abs Immature Granulocytes: 0.07 10*3/uL (ref 0.00–0.07)
Basophils Absolute: 0 10*3/uL (ref 0.0–0.1)
Basophils Relative: 0 %
Eosinophils Absolute: 0.1 10*3/uL (ref 0.0–0.5)
Eosinophils Relative: 1 %
HCT: 39.9 % (ref 36.0–46.0)
Hemoglobin: 12.4 g/dL (ref 12.0–15.0)
Immature Granulocytes: 0 %
Lymphocytes Relative: 51 %
Lymphs Abs: 8.4 10*3/uL — ABNORMAL HIGH (ref 0.7–4.0)
MCH: 21.1 pg — ABNORMAL LOW (ref 26.0–34.0)
MCHC: 31.1 g/dL (ref 30.0–36.0)
MCV: 67.7 fL — ABNORMAL LOW (ref 80.0–100.0)
Monocytes Absolute: 0.9 10*3/uL (ref 0.1–1.0)
Monocytes Relative: 5 %
Neutro Abs: 7.2 10*3/uL (ref 1.7–7.7)
Neutrophils Relative %: 43 %
Platelets: 141 10*3/uL — ABNORMAL LOW (ref 150–400)
RBC: 5.89 MIL/uL — ABNORMAL HIGH (ref 3.87–5.11)
RDW: 18.2 % — ABNORMAL HIGH (ref 11.5–15.5)
WBC: 16.6 10*3/uL — ABNORMAL HIGH (ref 4.0–10.5)
nRBC: 0 % (ref 0.0–0.2)

## 2018-11-18 LAB — COMPREHENSIVE METABOLIC PANEL
ALBUMIN: 2.5 g/dL — AB (ref 3.5–5.0)
ALT: 62 U/L — ABNORMAL HIGH (ref 0–44)
ANION GAP: 9 (ref 5–15)
AST: 46 U/L — ABNORMAL HIGH (ref 15–41)
Alkaline Phosphatase: 72 U/L (ref 38–126)
BILIRUBIN TOTAL: 0.4 mg/dL (ref 0.3–1.2)
BUN: 44 mg/dL — ABNORMAL HIGH (ref 8–23)
CO2: 21 mmol/L — ABNORMAL LOW (ref 22–32)
Calcium: 7.8 mg/dL — ABNORMAL LOW (ref 8.9–10.3)
Chloride: 105 mmol/L (ref 98–111)
Creatinine, Ser: 1.78 mg/dL — ABNORMAL HIGH (ref 0.44–1.00)
GFR calc Af Amer: 35 mL/min — ABNORMAL LOW (ref >60)
GFR calc non Af Amer: 30 mL/min — ABNORMAL LOW (ref >60)
Glucose, Bld: 133 mg/dL — ABNORMAL HIGH (ref 70–99)
Potassium: 3.6 mmol/L (ref 3.5–5.1)
Sodium: 135 mmol/L (ref 135–145)
Total Protein: 5.4 g/dL — ABNORMAL LOW (ref 6.5–8.1)

## 2018-11-18 LAB — BASIC METABOLIC PANEL
Anion gap: 9 (ref 5–15)
BUN: 47 mg/dL — ABNORMAL HIGH (ref 8–23)
CO2: 22 mmol/L (ref 22–32)
Calcium: 8.1 mg/dL — ABNORMAL LOW (ref 8.9–10.3)
Chloride: 104 mmol/L (ref 98–111)
Creatinine, Ser: 2.06 mg/dL — ABNORMAL HIGH (ref 0.44–1.00)
GFR calc Af Amer: 29 mL/min — ABNORMAL LOW (ref >60)
GFR calc non Af Amer: 25 mL/min — ABNORMAL LOW (ref >60)
Glucose, Bld: 110 mg/dL — ABNORMAL HIGH (ref 70–99)
Potassium: 3.5 mmol/L (ref 3.5–5.1)
Sodium: 135 mmol/L (ref 135–145)

## 2018-11-18 LAB — HEPATIC FUNCTION PANEL
ALT: 70 U/L — ABNORMAL HIGH (ref 0–44)
AST: 65 U/L — ABNORMAL HIGH (ref 15–41)
Albumin: 2.6 g/dL — ABNORMAL LOW (ref 3.5–5.0)
Alkaline Phosphatase: 78 U/L (ref 38–126)
BILIRUBIN DIRECT: 0.1 mg/dL (ref 0.0–0.2)
Indirect Bilirubin: 0.4 mg/dL (ref 0.3–0.9)
Total Bilirubin: 0.5 mg/dL (ref 0.3–1.2)
Total Protein: 5.8 g/dL — ABNORMAL LOW (ref 6.5–8.1)

## 2018-11-18 LAB — GLUCOSE, CAPILLARY
Glucose-Capillary: 57 mg/dL — ABNORMAL LOW (ref 70–99)
Glucose-Capillary: 114 mg/dL — ABNORMAL HIGH (ref 70–99)
Glucose-Capillary: 166 mg/dL — ABNORMAL HIGH (ref 70–99)
Glucose-Capillary: 143 mg/dL — ABNORMAL HIGH (ref 70–99)
Glucose-Capillary: 226 mg/dL — ABNORMAL HIGH (ref 70–99)

## 2018-11-18 LAB — HIV ANTIBODY (ROUTINE TESTING W REFLEX): HIV Screen 4th Generation wRfx: NONREACTIVE

## 2018-11-18 LAB — APTT: aPTT: 109 seconds — ABNORMAL HIGH (ref 24–36)

## 2018-11-18 LAB — HEPARIN LEVEL (UNFRACTIONATED): Heparin Unfractionated: 0.82 IU/mL — ABNORMAL HIGH (ref 0.30–0.70)

## 2018-11-18 MED ORDER — BISACODYL 10 MG RE SUPP: 10.0000 mg | Freq: Once | RECTAL | Status: DC

## 2018-11-18 MED ORDER — SODIUM CHLORIDE 0.9 % IV BOLUS: 500.0000 mL | Freq: Once | INTRAVENOUS | Status: DC

## 2018-11-18 MED ORDER — OXYCODONE-ACETAMINOPHEN 5-325 MG PO TABS
1.0000 | ORAL_TABLET | Freq: Three times a day (TID) | ORAL | Status: DC | PRN
  Administered 2018-11-19: 1 via ORAL
  Filled 2018-11-18: qty 1

## 2018-11-18 MED ORDER — RIVAROXABAN 20 MG PO TABS
20.0000 mg | ORAL_TABLET | Freq: Every day | ORAL | Status: DC
  Administered 2018-11-18: 20 mg via ORAL
  Filled 2018-11-18: qty 1

## 2018-11-18 MED ORDER — SODIUM CHLORIDE 0.9 % IV SOLN
INTRAVENOUS | Status: DC
  Administered 2018-11-18 (×2): via INTRAVENOUS

## 2018-11-18 MED ORDER — OXYCODONE HCL 5 MG PO TABS: 5.0000 mg | ORAL_TABLET | Freq: Three times a day (TID) | ORAL | Status: DC

## 2018-11-18 MED ORDER — POLYETHYLENE GLYCOL 3350 17 G PO PACK
17.0000 g | PACK | Freq: Once | ORAL | Status: AC
  Administered 2018-11-18: 17 g via ORAL
  Filled 2018-11-18: qty 1

## 2018-11-18 MED ORDER — OXYCODONE-ACETAMINOPHEN 5-325 MG PO TABS: 1.0000 | ORAL_TABLET | Freq: Three times a day (TID) | ORAL | Status: DC

## 2018-11-18 MED ORDER — OXYCODONE-ACETAMINOPHEN 10-325 MG PO TABS: 1.0000 | ORAL_TABLET | Freq: Three times a day (TID) | ORAL | Status: DC

## 2018-11-18 MED ORDER — SODIUM CHLORIDE 0.9 % IV BOLUS
500.0000 mL | Freq: Once | INTRAVENOUS | Status: AC
  Administered 2018-11-18: 500 mL via INTRAVENOUS

## 2018-11-18 MED ORDER — INSULIN GLARGINE 100 UNIT/ML ~~LOC~~ SOLN
25.0000 [IU] | Freq: Every day | SUBCUTANEOUS | Status: DC
  Administered 2018-11-18: 25 [IU] via SUBCUTANEOUS
  Filled 2018-11-18 (×2): qty 0.25

## 2018-11-18 MED ORDER — OXYCODONE HCL 5 MG PO TABS
5.0000 mg | ORAL_TABLET | Freq: Three times a day (TID) | ORAL | Status: DC | PRN
  Administered 2018-11-18: 5 mg via ORAL
  Filled 2018-11-18: qty 1

## 2018-11-18 NOTE — Care Management Note (Signed)
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  PTA, pt independent, lives with cousin.                    Action/Plan: Pt has PCP, Dr. Volney American, and denies any issues with getting medications filled.  She denies any needs for home at this time.    Expected Discharge Date:                  Expected Discharge Plan:  Home/Self Care  In-House Referral:     Discharge planning Services  CM Consult  Post Acute Care Choice:    Choice offered to:     DME Arranged:    DME Agency:     HH Arranged:    [place] Agency:     Status of Service:  Completed, signed off  If discussed at [employer] of Stay Meetings, dates discussed:    Additional Comments:  Ella Bodo, RN 11/18/2018, 2:03 PM

## 2018-11-18 NOTE — Care Management Obs Status (Signed)
[place] NOTIFICATION   Patient Details  Name: Alejandra Hernandez MRN: [id number] Date of Birth: 12/02/1955   Medicare Observation Status Notification Given:  Yes    Ella Bodo, RN 11/18/2018, 1:58 PM
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
## 2018-11-18 NOTE — Progress Notes (Signed)
ANTICOAGULATION CONSULT NOTE   Pharmacy Consult for heparin Indication: chest pain/ACS  No Known Allergies  Patient Measurements: Height: 5\' 3"  (160 cm) Weight: 145 lb 4.5 oz (65.9 kg) IBW/kg (Calculated) : 52.4 Heparin Dosing Weight: 67.2  Vital Signs: Temp: 98.1 F (36.7 C) (12/26 0300) Temp Source: Oral (12/26 0300) BP: 117/53 (12/26 0300) Pulse Rate: 54 (12/26 0300)  Labs: Recent Labs    11/16/18 1403  11/17/18 0552 11/17/18 1218 11/17/18 1427 11/17/18 1823 11/18/18 0218  HGB 13.9  --  13.9  --   --   --  12.4  HCT 47.5*  --  46.9*  --   --   --  39.9  PLT 241  --  234  --   --   --  141*  APTT  --    < > 91* 96*  --   --  109*  HEPARINUNFRC  --   --  0.76* 0.74*  --   --  0.82*  CREATININE 1.30*  --  1.89*  --  2.12*  --  2.06*  TROPONINI  --    < > 0.21* 0.31*  --  0.28*  --    < > = values in this interval not displayed.    Estimated Creatinine Clearance: 25.8 mL/min (A) (by C-G formula based on SCr of 2.06 mg/dL (H)).  Assessment: 62 y.o. female admitted with possible ACS, h/o DVT and Xarelto on hold, for heparin.   Heparin level still slightly elevated falsely at 0.82, aPTT above goal at 109 sec. No s/sx of bleeding. No infusion issues.   Goal of Therapy:  APTT goal: 66-102 Heparin level 0.3-0.7 units/ml Monitor platelets by anticoagulation protocol: Yes   Plan:  Decrease heparin infusion to 700 units/hr Monitor daily HL, aPTT, and CBC  Thanks for allowing pharmacy to be a part of this patient's care.  Excell Seltzer, PharmD Clinical Pharmacist

## 2018-11-18 NOTE — Progress Notes (Signed)
Per patient warm compress on back relieved back pain.

## 2018-11-18 NOTE — Plan of Care (Signed)
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
## 2018-11-18 NOTE — Care Management CC44 (Signed)
Condition Code 44 Documentation Completed  Patient Details  Name: MAYU RONK MRN: [id number] Date of Birth: 08-06-1956   Condition Code 44 given:  Yes Patient signature on Condition Code 44 notice:  Yes Documentation of 2 MD's agreement:  Yes Code 44 added to claim:  Yes    Ella Bodo, RN 11/18/2018, 1:58 PM

## 2018-11-19 DIAGNOSIS — E1010 Type 1 diabetes mellitus with ketoacidosis without coma: Secondary | ICD-10-CM

## 2018-11-19 LAB — CBC WITH DIFFERENTIAL/PLATELET
ABS IMMATURE GRANULOCYTES: 0.04 10*3/uL (ref 0.00–0.07)
BASOS PCT: 0 %
Basophils Absolute: 0 10*3/uL (ref 0.0–0.1)
Eosinophils Absolute: 0.1 10*3/uL (ref 0.0–0.5)
Eosinophils Relative: 1 %
HCT: 35.1 % — ABNORMAL LOW (ref 36.0–46.0)
Hemoglobin: 10.6 g/dL — ABNORMAL LOW (ref 12.0–15.0)
Immature Granulocytes: 0 %
Lymphocytes Relative: 49 %
Lymphs Abs: 4.8 10*3/uL — ABNORMAL HIGH (ref 0.7–4.0)
MCH: 20.8 pg — ABNORMAL LOW (ref 26.0–34.0)
MCHC: 30.2 g/dL (ref 30.0–36.0)
MCV: 68.8 fL — ABNORMAL LOW (ref 80.0–100.0)
Monocytes Absolute: 0.8 10*3/uL (ref 0.1–1.0)
Monocytes Relative: 8 %
NEUTROS ABS: 4.3 10*3/uL (ref 1.7–7.7)
Neutrophils Relative %: 42 %
Platelets: 158 10*3/uL (ref 150–400)
RBC: 5.1 MIL/uL (ref 3.87–5.11)
RDW: 18.3 % — ABNORMAL HIGH (ref 11.5–15.5)
WBC: 10.1 10*3/uL (ref 4.0–10.5)
nRBC: 0 % (ref 0.0–0.2)

## 2018-11-19 LAB — GLUCOSE, CAPILLARY
Glucose-Capillary: 114 mg/dL — ABNORMAL HIGH (ref 70–99)
Glucose-Capillary: 132 mg/dL — ABNORMAL HIGH (ref 70–99)

## 2018-11-19 LAB — MAGNESIUM: Magnesium: 1.8 mg/dL (ref 1.7–2.4)

## 2018-11-19 NOTE — Progress Notes (Signed)
Pt provided with verbal d/c instructions. Paper copy of AVS provided to patient.  No further questions. Belongings sent with patient. VSS at d/c. IV removed per orders. Pt d/c by wheelchair through front entrance by private vehicle.
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
## 2018-11-19 NOTE — Discharge Instructions (Signed)

## 2018-11-23 ENCOUNTER — Other Ambulatory Visit: Payer: Self-pay | Admitting: Cardiology

## 2018-11-30 ENCOUNTER — Other Ambulatory Visit: Payer: Self-pay | Admitting: Cardiology

## 2018-11-30 MED ORDER — ISOSORBIDE MONONITRATE ER 30 MG PO TB24: 90.0000 mg | ORAL_TABLET | Freq: Every day | ORAL | 3 refills | Status: DC

## 2018-11-30 NOTE — Telephone Encounter (Signed)
Pt's medication was sent to pt's pharmacy as requested. Confirmation received.  °

## 2018-11-30 NOTE — Telephone Encounter (Signed)
°*  STAT* If patient is at the pharmacy, call can be transferred to refill team.   1. Which medications need to be refilled? (please list name of each medication and dose if known)  Isosorbide  2. Which pharmacy/location (including street and city if local pharmacy) is medication to be sent to?[facility] [phone]  3. Do they need a 30 day or 90 day supply? 90 and refills

## 2018-12-09 ENCOUNTER — Other Ambulatory Visit: Payer: Self-pay

## 2018-12-09 MED ORDER — FUROSEMIDE 20 MG PO TABS: 20.0000 mg | ORAL_TABLET | ORAL | 5 refills | Status: DC

## 2018-12-13 ENCOUNTER — Encounter: Payer: Self-pay | Admitting: Physician Assistant
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
## 2018-12-14 ENCOUNTER — Encounter: Payer: Self-pay | Admitting: *Deleted

## 2018-12-14 ENCOUNTER — Ambulatory Visit (INDEPENDENT_AMBULATORY_CARE_PROVIDER_SITE_OTHER): Payer: Medicare Other | Admitting: Physician Assistant

## 2018-12-14 ENCOUNTER — Encounter: Payer: Self-pay | Admitting: Physician Assistant

## 2018-12-14 VITALS — BP 108/62 | HR 49 | Ht 63.0 in | Wt 158.4 lb

## 2018-12-14 DIAGNOSIS — I10 Essential (primary) hypertension: Secondary | ICD-10-CM

## 2018-12-14 DIAGNOSIS — I739 Peripheral vascular disease, unspecified: Secondary | ICD-10-CM | POA: Diagnosis not present

## 2018-12-14 DIAGNOSIS — R0602 Shortness of breath: Secondary | ICD-10-CM | POA: Diagnosis not present

## 2018-12-14 DIAGNOSIS — I2510 Atherosclerotic heart disease of native coronary artery without angina pectoris: Secondary | ICD-10-CM

## 2018-12-14 DIAGNOSIS — D649 Anemia, unspecified: Secondary | ICD-10-CM

## 2018-12-14 DIAGNOSIS — E785 Hyperlipidemia, unspecified: Secondary | ICD-10-CM

## 2018-12-14 DIAGNOSIS — R079 Chest pain, unspecified: Secondary | ICD-10-CM

## 2018-12-14 DIAGNOSIS — Z86718 Personal history of other venous thrombosis and embolism: Secondary | ICD-10-CM

## 2018-12-14 MED ORDER — CARVEDILOL 3.125 MG PO TABS: 3.1250 mg | ORAL_TABLET | Freq: Two times a day (BID) | ORAL | 3 refills | Status: DC

## 2018-12-14 NOTE — Patient Instructions (Addendum)
Medication Instructions:  Your physician has recommended you make the following change in your medication:  1.  REDUCE the Carvedilol to 3.125 taking 1 tablet twice a day  If you need a refill on your cardiac medications before your next appointment, please call your pharmacy.   Lab work: TODAY:  CMET, CBC, PRO BNP, & LIPID  If you have labs (blood work) drawn today and your tests are completely normal, you will receive your results only by: Marland Kitchen MyChart Message (if you have MyChart) OR . A paper copy in the mail If you have any lab test that is abnormal or we need to change your treatment, we will call you to review the results.  Testing/Procedures: Your physician has requested that you have a lexiscan myoview. For further information please visit [email or web address]. Please follow instruction sheet, as given.    Follow-Up: Your physician recommends that you schedule a follow-up appointment in: 12/29/2018 ARRIVE AT 11:45 TO SEE MICHELE LENZE, PA-C    Any Other Special Instructions Will Be Listed Below (If Applicable).  Cardiac Nuclear Scan A cardiac nuclear scan is a test that is done to check the flow of blood to your heart. It is done when you are resting and when you are exercising. The test looks for problems such as:  Not enough blood reaching a portion of the heart.  The heart muscle not working as it should. You may need this test if:  You have heart disease.  You have had lab results that are not normal.  You have had heart surgery or a balloon procedure to open up blocked arteries (angioplasty).  You have chest pain.  You have shortness of breath. In this test, a special dye (tracer) is put into your bloodstream. The tracer will travel to your heart. A camera will then take pictures of your heart to see how the tracer moves through your heart. This test is usually done at a hospital and takes 2-4 hours. Tell a doctor about:  Any allergies you have.  All medicines  you are taking, including vitamins, herbs, eye drops, creams, and over-the-counter medicines.  Any problems you or family members have had with anesthetic medicines.  Any blood disorders you have.  Any surgeries you have had.  Any medical conditions you have.  Whether you are pregnant or may be pregnant. What are the risks? Generally, this is a safe test. However, problems may occur, such as:  Serious chest pain and heart attack. This is only a risk if the stress portion of the test is done.  Rapid heartbeat.  A feeling of warmth in your chest. This feeling usually does not last long.  Allergic reaction to the tracer. What happens before the test?  Ask your doctor about changing or stopping your normal medicines. This is important.  Follow instructions from your doctor about what you cannot eat or drink.  Remove your jewelry on the day of the test. What happens during the test?  An IV tube will be inserted into one of your veins.  Your doctor will give you a small amount of tracer through the IV tube.  You will wait for 20-40 minutes while the tracer moves through your bloodstream.  Your heart will be monitored with an electrocardiogram (ECG).  You will lie down on an exam table.  Pictures of your heart will be taken for about 15-20 minutes.  You may also have a stress test. For this test, one of these things may be done: ?  You will be asked to exercise on a treadmill or a stationary bike. ? You will be given medicines that will make your heart work harder. This is done if you are unable to exercise.  When blood flow to your heart has peaked, a tracer will again be given through the IV tube.  After 20-40 minutes, you will get back on the exam table. More pictures will be taken of your heart.  Depending on the tracer that is used, more pictures may need to be taken 3-4 hours later.  Your IV tube will be removed when the test is over. The test may vary among doctors  and hospitals. What happens after the test?  Ask your doctor: ? Whether you can return to your normal schedule, including diet, activities, and medicines. ? Whether you should drink more fluids. This will help to remove the tracer from your body. Drink enough fluid to keep your pee (urine) pale yellow.  Ask your doctor, or the department that is doing the test: ? When will my results be ready? ? How will I get my results? Summary  A cardiac nuclear scan is a test that is done to check the flow of blood to your heart.  Tell your doctor whether you are pregnant or may be pregnant.  Before the test, ask your doctor about changing or stopping your normal medicines. This is important.  Ask your doctor whether you can return to your normal activities. You may be asked to drink more fluids. This information is not intended to replace advice given to you by your health care provider. Make sure you discuss any questions you have with your health care provider. Document Released: 04/26/2018 Document Revised: 04/26/2018 Document Reviewed: 04/26/2018 Elsevier Interactive Patient Education  2019 [employer].

## 2018-12-15 LAB — LIPID PANEL
Chol/HDL Ratio: 3.2 ratio (ref 0.0–4.4)
Cholesterol, Total: 122 mg/dL (ref 100–199)
HDL: 38 mg/dL — ABNORMAL LOW (ref >39)
LDL Calculated: 51 mg/dL (ref 0–99)
Triglycerides: 165 mg/dL — ABNORMAL HIGH (ref 0–149)
VLDL CHOLESTEROL CAL: 33 mg/dL (ref 5–40)

## 2018-12-15 LAB — COMPREHENSIVE METABOLIC PANEL
ALT: 21 IU/L (ref 0–32)
AST: 21 IU/L (ref 0–40)
Albumin/Globulin Ratio: 1.4 (ref 1.2–2.2)
Albumin: 3.5 g/dL — ABNORMAL LOW (ref 3.8–4.8)
Alkaline Phosphatase: 128 IU/L — ABNORMAL HIGH (ref 39–117)
BUN/Creatinine Ratio: 10 — ABNORMAL LOW (ref 12–28)
BUN: 15 mg/dL (ref 8–27)
Bilirubin Total: <0.2 mg/dL (ref 0.0–1.2)
CALCIUM: 9 mg/dL (ref 8.7–10.3)
CO2: 21 mmol/L (ref 20–29)
CREATININE: 1.54 mg/dL — AB (ref 0.57–1.00)
Chloride: 98 mmol/L (ref 96–106)
GFR calc Af Amer: 41 mL/min/{1.73_m2} — ABNORMAL LOW (ref >59)
GFR, EST NON AFRICAN AMERICAN: 36 mL/min/{1.73_m2} — AB (ref >59)
Globulin, Total: 2.5 g/dL (ref 1.5–4.5)
Glucose: 308 mg/dL — ABNORMAL HIGH (ref 65–99)
Potassium: 3.9 mmol/L (ref 3.5–5.2)
Sodium: 134 mmol/L (ref 134–144)
TOTAL PROTEIN: 6 g/dL (ref 6.0–8.5)

## 2018-12-15 LAB — PRO B NATRIURETIC PEPTIDE: NT-Pro BNP: 299 pg/mL — ABNORMAL HIGH (ref 0–287)

## 2018-12-15 LAB — CBC
HEMATOCRIT: 36.4 % (ref 34.0–46.6)
Hemoglobin: 10.7 g/dL — ABNORMAL LOW (ref 11.1–15.9)
MCH: 21.1 pg — ABNORMAL LOW (ref 26.6–33.0)
MCHC: 29.4 g/dL — ABNORMAL LOW (ref 31.5–35.7)
MCV: 72 fL — ABNORMAL LOW (ref 79–97)
Platelets: 333 10*3/uL (ref 150–450)
RBC: 5.08 x10E6/uL (ref 3.77–5.28)
RDW: 17.2 % — ABNORMAL HIGH (ref 11.7–15.4)
WBC: 8.4 10*3/uL (ref 3.4–10.8)

## 2018-12-20 ENCOUNTER — Telehealth: Payer: Self-pay | Admitting: Physician Assistant

## 2018-12-20 NOTE — Telephone Encounter (Signed)
New Message     Pt c/o swelling: STAT is pt has developed SOB within 24 hours  1) How much weight have you gained and in what time span? 8 lbs since yesterday  2) If swelling, where is the swelling located? leg  3) Are you currently taking a fluid pill? Lasix 60 mg a day- taking an extra one tonight  4) Are you currently SOB? yes  5) Do you have a log of your daily weights (if so, list)? yes  6) Have you gained 3 pounds in a day or 5 pounds in a week? 8 lbs in 1 day  Have you traveled recently? No    Angelina from [facility]- wanted you to know that she is converting pt over to CHF Program with [employer] :

## 2018-12-20 NOTE — Telephone Encounter (Signed)
LET MESSAGE TO CALL  TOMORROW

## 2018-12-20 NOTE — Telephone Encounter (Signed)
Left message for patient to call back and discuss  weight gain.

## 2018-12-20 NOTE — Telephone Encounter (Signed)
Will route to Triage for further evaluation

## 2018-12-21 ENCOUNTER — Telehealth (HOSPITAL_COMMUNITY): Payer: Self-pay | Admitting: *Deleted

## 2018-12-21 NOTE — Telephone Encounter (Signed)
I have not seen her in over a year please work her in with the extender this week

## 2018-12-21 NOTE — Telephone Encounter (Signed)
Patient returning call.

## 2018-12-21 NOTE — Telephone Encounter (Signed)
Per pt has taken a extra Furosemide yesterday and is down 2 Lb of the 8 Per pt feels better will forward to Dr Radford Pax for review./cy

## 2018-12-21 NOTE — Telephone Encounter (Signed)
Patient given detailed instructions per Myocardial Perfusion Study Information Sheet for the test on 12/27/18 at 0800. Patient notified to arrive 15 minutes early and that it is imperative to arrive on time for appointment to keep from having the test rescheduled.  If you need to cancel or reschedule your appointment, please call the office within 24 hours of your appointment. . Patient verbalized understanding.Amos Micheals, Ranae Palms

## 2018-12-21 NOTE — Telephone Encounter (Signed)
Left message for pt to call.

## 2018-12-22 NOTE — Telephone Encounter (Signed)
Lm to call back ./cy 

## 2018-12-22 NOTE — Telephone Encounter (Signed)
Per pt has several upcoming appts and is unable to come in until the week of Feb 10 th   Appt made for Feb 14th and pt instructed if has worsening symptoms to call back and may make an earlier appt Pt agrees./cy

## 2018-12-27 ENCOUNTER — Ambulatory Visit (HOSPITAL_COMMUNITY): Payer: Medicare Other | Attending: Cardiology

## 2018-12-27 DIAGNOSIS — R0602 Shortness of breath: Secondary | ICD-10-CM

## 2018-12-27 DIAGNOSIS — R079 Chest pain, unspecified: Secondary | ICD-10-CM

## 2018-12-27 LAB — MYOCARDIAL PERFUSION IMAGING
LVDIAVOL: 66 mL (ref 46–106)
LVSYSVOL: 23 mL
Peak HR: 57 {beats}/min
Rest HR: 52 {beats}/min
SDS: 0
SRS: 2
SSS: 2
TID: 0.97

## 2018-12-27 MED ORDER — TECHNETIUM TC 99M TETROFOSMIN IV KIT
32.3000 | PACK | Freq: Once | INTRAVENOUS | Status: AC | PRN
  Administered 2018-12-27: 32.3 via INTRAVENOUS
  Filled 2018-12-27: qty 33

## 2018-12-27 MED ORDER — TECHNETIUM TC 99M TETROFOSMIN IV KIT
9.7000 | PACK | Freq: Once | INTRAVENOUS | Status: AC | PRN
  Administered 2018-12-27: 9.7 via INTRAVENOUS
  Filled 2018-12-27: qty 10

## 2018-12-27 MED ORDER — REGADENOSON 0.4 MG/5ML IV SOLN
0.4000 mg | Freq: Once | INTRAVENOUS | Status: AC
  Administered 2018-12-27: 0.4 mg via INTRAVENOUS

## 2018-12-29 ENCOUNTER — Telehealth: Payer: Self-pay | Admitting: Physician Assistant

## 2018-12-29 ENCOUNTER — Ambulatory Visit: Payer: Medicare Other | Admitting: Physician Assistant

## 2018-12-29 NOTE — Telephone Encounter (Signed)
New Message   Patient is returning call in reference to myocardial test results. Please call.

## 2018-12-29 NOTE — Telephone Encounter (Signed)
New message    Pt calling back to discuss test results . She doesn't have access to [employer]

## 2018-12-29 NOTE — Telephone Encounter (Signed)
Returned pts call and left another message. She was advised that she has a message on mychart and if she had any questions, to call back.

## 2018-12-29 NOTE — Telephone Encounter (Signed)
Returned pts call.  Left another message for pt to call back. 

## 2018-12-30 ENCOUNTER — Encounter: Payer: Self-pay | Admitting: Endocrinology

## 2018-12-30 ENCOUNTER — Ambulatory Visit (INDEPENDENT_AMBULATORY_CARE_PROVIDER_SITE_OTHER): Payer: Medicare Other | Admitting: Endocrinology

## 2018-12-30 VITALS — BP 112/52 | HR 68 | Ht 63.0 in | Wt 160.2 lb

## 2018-12-30 DIAGNOSIS — Z794 Long term (current) use of insulin: Secondary | ICD-10-CM

## 2018-12-30 DIAGNOSIS — E118 Type 2 diabetes mellitus with unspecified complications: Secondary | ICD-10-CM

## 2018-12-30 LAB — POCT GLYCOSYLATED HEMOGLOBIN (HGB A1C): Hemoglobin A1C: 13.3 % — AB (ref 4.0–5.6)

## 2018-12-30 MED ORDER — INSULIN DEGLUDEC 200 UNIT/ML ~~LOC~~ SOPN: 150.0000 [IU] | PEN_INJECTOR | Freq: Every day | SUBCUTANEOUS | 11 refills | Status: DC
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
## 2018-12-30 NOTE — Patient Instructions (Addendum)
check your blood sugar twice a day.  vary the time of day when you check, between before the 3 meals, and at bedtime.  also check if you have symptoms of your blood sugar being too high or too low.  please keep a record of the readings and bring it to your next appointment here (or you can bring the meter itself).  You can write it on any piece of paper.  please call us sooner if your blood sugar goes below 70, or if you have a lot of readings over 200.  Please increase the Tresiba to 150 units daily.  On this type of insulin schedule, you should eat meals on a regular schedule.  If a meal is missed or significantly delayed, your blood sugar could go low.  You may need to eat a light snack at bedtime, to avoid the blood sugar from going low in the middle of the night.   Please come back for a follow-up appointment in 2 months.
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
## 2019-01-06 ENCOUNTER — Encounter: Payer: Self-pay | Admitting: Physician Assistant

## 2019-01-06 ENCOUNTER — Ambulatory Visit (INDEPENDENT_AMBULATORY_CARE_PROVIDER_SITE_OTHER): Payer: Medicare Other | Admitting: Physician Assistant

## 2019-01-06 VITALS — BP 106/40 | HR 56 | Ht 63.0 in | Wt 157.6 lb

## 2019-01-06 DIAGNOSIS — Z86718 Personal history of other venous thrombosis and embolism: Secondary | ICD-10-CM | POA: Diagnosis not present

## 2019-01-06 DIAGNOSIS — I10 Essential (primary) hypertension: Secondary | ICD-10-CM

## 2019-01-06 DIAGNOSIS — D649 Anemia, unspecified: Secondary | ICD-10-CM

## 2019-01-06 DIAGNOSIS — I2510 Atherosclerotic heart disease of native coronary artery without angina pectoris: Secondary | ICD-10-CM

## 2019-01-06 DIAGNOSIS — E785 Hyperlipidemia, unspecified: Secondary | ICD-10-CM

## 2019-01-06 NOTE — Patient Instructions (Addendum)
Medication Instructions:  Your physician has recommended you make the following change in your medication:  1.  STOP the Carvedilol  If you need a refill on your cardiac medications before your next appointment, please call your pharmacy.   Lab work: None ordered  If you have labs (blood work) drawn today and your tests are completely normal, you will receive your results only by: Marland Kitchen MyChart Message (if you have MyChart) OR . A paper copy in the mail If you have any lab test that is abnormal or we need to change your treatment, we will call you to review the results.  Testing/Procedures: None ordered  Follow-Up:.Your physician recommends that you schedule a follow-up appointment in 04/15/2019 ARRIVE AT 8:00 TO SEE DR. Radford Pax  Any Other Special Instructions Will Be Listed Below (If Applicable).

## 2019-01-07 ENCOUNTER — Ambulatory Visit: Payer: Medicare Other | Admitting: Gastroenterology

## 2019-01-07 ENCOUNTER — Encounter: Payer: Self-pay | Admitting: Hematology and Oncology

## 2019-01-07 ENCOUNTER — Ambulatory Visit: Payer: Medicare Other | Admitting: Physician Assistant

## 2019-01-07 ENCOUNTER — Telehealth: Payer: Self-pay | Admitting: Hematology and Oncology

## 2019-01-07 NOTE — Telephone Encounter (Signed)
A new hem appt has been scheduled for the pt to see Dr. Lindi Adie on 3/10 at 1pm. Pt aware to arrive 30 minutes early. Letter mailed to the pt.

## 2019-01-11 ENCOUNTER — Ambulatory Visit: Payer: Medicare Other | Admitting: Physician Assistant

## 2019-01-16 ENCOUNTER — Observation Stay (HOSPITAL_COMMUNITY)
Admission: EM | Admit: 2019-01-16 | Discharge: 2019-01-17 | Disposition: A | Discharge status: Home / Self Care | Payer: Medicare Other | Attending: Internal Medicine | Admitting: Internal Medicine

## 2019-01-16 ENCOUNTER — Emergency Department (HOSPITAL_COMMUNITY): Payer: Medicare Other

## 2019-01-16 ENCOUNTER — Encounter (HOSPITAL_COMMUNITY): Payer: Self-pay

## 2019-01-16 DIAGNOSIS — N183 Chronic kidney disease, stage 3 unspecified: Secondary | ICD-10-CM | POA: Diagnosis present

## 2019-01-16 DIAGNOSIS — Z7982 Long term (current) use of aspirin: Secondary | ICD-10-CM | POA: Insufficient documentation

## 2019-01-16 DIAGNOSIS — Z7901 Long term (current) use of anticoagulants: Secondary | ICD-10-CM | POA: Diagnosis not present

## 2019-01-16 DIAGNOSIS — M1990 Unspecified osteoarthritis, unspecified site: Secondary | ICD-10-CM | POA: Insufficient documentation

## 2019-01-16 DIAGNOSIS — E1065 Type 1 diabetes mellitus with hyperglycemia: Secondary | ICD-10-CM | POA: Insufficient documentation

## 2019-01-16 DIAGNOSIS — I739 Peripheral vascular disease, unspecified: Secondary | ICD-10-CM

## 2019-01-16 DIAGNOSIS — Z79899 Other long term (current) drug therapy: Secondary | ICD-10-CM | POA: Insufficient documentation

## 2019-01-16 DIAGNOSIS — I6523 Occlusion and stenosis of bilateral carotid arteries: Secondary | ICD-10-CM

## 2019-01-16 DIAGNOSIS — R55 Syncope and collapse: Principal | ICD-10-CM

## 2019-01-16 DIAGNOSIS — R739 Hyperglycemia, unspecified: Secondary | ICD-10-CM | POA: Diagnosis present

## 2019-01-16 DIAGNOSIS — I2510 Atherosclerotic heart disease of native coronary artery without angina pectoris: Secondary | ICD-10-CM | POA: Diagnosis present

## 2019-01-16 DIAGNOSIS — R0602 Shortness of breath: Secondary | ICD-10-CM | POA: Diagnosis not present

## 2019-01-16 DIAGNOSIS — Z87891 Personal history of nicotine dependence: Secondary | ICD-10-CM | POA: Insufficient documentation

## 2019-01-16 DIAGNOSIS — IMO0002 Reserved for concepts with insufficient information to code with codable children: Secondary | ICD-10-CM | POA: Diagnosis present

## 2019-01-16 DIAGNOSIS — Z86718 Personal history of other venous thrombosis and embolism: Secondary | ICD-10-CM | POA: Diagnosis not present

## 2019-01-16 DIAGNOSIS — K219 Gastro-esophageal reflux disease without esophagitis: Secondary | ICD-10-CM | POA: Diagnosis present

## 2019-01-16 DIAGNOSIS — I129 Hypertensive chronic kidney disease with stage 1 through stage 4 chronic kidney disease, or unspecified chronic kidney disease: Secondary | ICD-10-CM | POA: Insufficient documentation

## 2019-01-16 DIAGNOSIS — E119 Type 2 diabetes mellitus without complications: Secondary | ICD-10-CM | POA: Diagnosis present

## 2019-01-16 DIAGNOSIS — E1010 Type 1 diabetes mellitus with ketoacidosis without coma: Secondary | ICD-10-CM

## 2019-01-16 DIAGNOSIS — I10 Essential (primary) hypertension: Secondary | ICD-10-CM | POA: Diagnosis present

## 2019-01-16 DIAGNOSIS — E109 Type 1 diabetes mellitus without complications: Secondary | ICD-10-CM

## 2019-01-16 DIAGNOSIS — I779 Disorder of arteries and arterioles, unspecified: Secondary | ICD-10-CM | POA: Diagnosis present

## 2019-01-16 LAB — CBG MONITORING, ED
GLUCOSE-CAPILLARY: 477 mg/dL — AB (ref 70–99)
Glucose-Capillary: 404 mg/dL — ABNORMAL HIGH (ref 70–99)
Glucose-Capillary: 324 mg/dL — ABNORMAL HIGH (ref 70–99)
Glucose-Capillary: 249 mg/dL — ABNORMAL HIGH (ref 70–99)

## 2019-01-16 LAB — BRAIN NATRIURETIC PEPTIDE: B NATRIURETIC PEPTIDE 5: 51.5 pg/mL (ref 0.0–100.0)

## 2019-01-16 LAB — URINALYSIS, ROUTINE W REFLEX MICROSCOPIC
Bilirubin Urine: NEGATIVE
Glucose, UA: >=500 mg/dL — AB
Ketones, ur: NEGATIVE mg/dL
Leukocytes,Ua: NEGATIVE
Nitrite: NEGATIVE
Protein, ur: 100 mg/dL — AB
Specific Gravity, Urine: 1.006 (ref 1.005–1.030)
pH: 6 (ref 5.0–8.0)

## 2019-01-16 LAB — CBC
HCT: 33.2 % — ABNORMAL LOW (ref 36.0–46.0)
Hemoglobin: 9.2 g/dL — ABNORMAL LOW (ref 12.0–15.0)
MCH: 19.7 pg — ABNORMAL LOW (ref 26.0–34.0)
MCHC: 27.7 g/dL — ABNORMAL LOW (ref 30.0–36.0)
MCV: 71.2 fL — AB (ref 80.0–100.0)
Platelets: 309 10*3/uL (ref 150–400)
RBC: 4.66 MIL/uL (ref 3.87–5.11)
RDW: 16.3 % — ABNORMAL HIGH (ref 11.5–15.5)
WBC: 6 10*3/uL (ref 4.0–10.5)
nRBC: 0 % (ref 0.0–0.2)

## 2019-01-16 LAB — BASIC METABOLIC PANEL
Anion gap: 11 (ref 5–15)
Anion gap: 8 (ref 5–15)
BUN: 9 mg/dL (ref 8–23)
BUN: 8 mg/dL (ref 8–23)
CO2: 18 mmol/L — ABNORMAL LOW (ref 22–32)
CO2: 21 mmol/L — ABNORMAL LOW (ref 22–32)
Calcium: 8.8 mg/dL — ABNORMAL LOW (ref 8.9–10.3)
Calcium: 9 mg/dL (ref 8.9–10.3)
Chloride: 102 mmol/L (ref 98–111)
Chloride: 110 mmol/L (ref 98–111)
Creatinine, Ser: 1.77 mg/dL — ABNORMAL HIGH (ref 0.44–1.00)
Creatinine, Ser: 1.52 mg/dL — ABNORMAL HIGH (ref 0.44–1.00)
GFR calc Af Amer: 35 mL/min — ABNORMAL LOW (ref >60)
GFR calc Af Amer: 42 mL/min — ABNORMAL LOW (ref >60)
GFR calc non Af Amer: 30 mL/min — ABNORMAL LOW (ref >60)
GFR calc non Af Amer: 36 mL/min — ABNORMAL LOW (ref >60)
GLUCOSE: 261 mg/dL — AB (ref 70–99)
Glucose, Bld: 539 mg/dL (ref 70–99)
Potassium: 3.9 mmol/L (ref 3.5–5.1)
Potassium: 4.8 mmol/L (ref 3.5–5.1)
Sodium: 131 mmol/L — ABNORMAL LOW (ref 135–145)
Sodium: 139 mmol/L (ref 135–145)

## 2019-01-16 LAB — GLUCOSE, CAPILLARY
Glucose-Capillary: 193 mg/dL — ABNORMAL HIGH (ref 70–99)
Glucose-Capillary: 223 mg/dL — ABNORMAL HIGH (ref 70–99)
Glucose-Capillary: 224 mg/dL — ABNORMAL HIGH (ref 70–99)
Glucose-Capillary: 229 mg/dL — ABNORMAL HIGH (ref 70–99)

## 2019-01-16 MED ORDER — SODIUM CHLORIDE 0.9% FLUSH: 3.0000 mL | Freq: Once | INTRAVENOUS | Status: DC

## 2019-01-16 MED ORDER — ALPRAZOLAM 0.5 MG PO TABS
0.5000 mg | ORAL_TABLET | Freq: Every day | ORAL | Status: DC
  Administered 2019-01-16: 0.5 mg via ORAL
  Filled 2019-01-16: qty 1

## 2019-01-16 MED ORDER — RIVAROXABAN 20 MG PO TABS
20.0000 mg | ORAL_TABLET | Freq: Every day | ORAL | Status: DC
  Administered 2019-01-16 – 2019-01-17 (×2): 20 mg via ORAL
  Filled 2019-01-16 (×2): qty 1

## 2019-01-16 MED ORDER — POTASSIUM CHLORIDE 10 MEQ/100ML IV SOLN: 10.0000 meq | INTRAVENOUS | Status: DC

## 2019-01-16 MED ORDER — SODIUM CHLORIDE 0.9 % IV BOLUS
1000.0000 mL | Freq: Once | INTRAVENOUS | Status: AC
  Administered 2019-01-16: 1000 mL via INTRAVENOUS

## 2019-01-16 MED ORDER — MORPHINE SULFATE (PF) 2 MG/ML IV SOLN
2.0000 mg | Freq: Once | INTRAVENOUS | Status: AC
  Administered 2019-01-16: 2 mg via INTRAVENOUS
  Filled 2019-01-16: qty 1

## 2019-01-16 MED ORDER — ISOSORBIDE MONONITRATE ER 60 MG PO TB24
90.0000 mg | ORAL_TABLET | Freq: Every day | ORAL | Status: DC
  Administered 2019-01-17: 90 mg via ORAL
  Filled 2019-01-16: qty 1

## 2019-01-16 MED ORDER — ONDANSETRON HCL 4 MG/2ML IJ SOLN
4.0000 mg | Freq: Once | INTRAMUSCULAR | Status: AC
  Administered 2019-01-16: 4 mg via INTRAVENOUS
  Filled 2019-01-16: qty 2

## 2019-01-16 MED ORDER — INSULIN REGULAR(HUMAN) IN NACL 100-0.9 UT/100ML-% IV SOLN
INTRAVENOUS | Status: DC
  Administered 2019-01-16: 3.4 [IU]/h via INTRAVENOUS
  Filled 2019-01-16: qty 100

## 2019-01-16 MED ORDER — POTASSIUM CHLORIDE 10 MEQ/100ML IV SOLN
10.0000 meq | INTRAVENOUS | Status: AC
  Administered 2019-01-16 (×2): 10 meq via INTRAVENOUS
  Filled 2019-01-16 (×2): qty 100

## 2019-01-16 MED ORDER — ASPIRIN 81 MG PO CHEW
81.0000 mg | CHEWABLE_TABLET | Freq: Every day | ORAL | Status: DC
  Administered 2019-01-17: 81 mg via ORAL
  Filled 2019-01-16 (×2): qty 1

## 2019-01-16 MED ORDER — SODIUM CHLORIDE 0.9 % IV SOLN: INTRAVENOUS | Status: DC

## 2019-01-16 MED ORDER — INSULIN REGULAR(HUMAN) IN NACL 100-0.9 UT/100ML-% IV SOLN
INTRAVENOUS | Status: DC
  Administered 2019-01-16: 3.3 [IU]/h via INTRAVENOUS
  Administered 2019-01-17: 11.3 [IU]/h via INTRAVENOUS
  Filled 2019-01-16: qty 100

## 2019-01-16 MED ORDER — DEXTROSE-NACL 5-0.45 % IV SOLN
INTRAVENOUS | Status: DC
  Administered 2019-01-16 – 2019-01-17 (×2): via INTRAVENOUS

## 2019-01-16 MED ORDER — SODIUM CHLORIDE 0.9 % IV SOLN: INTRAVENOUS | Status: AC

## 2019-01-16 MED ORDER — PANTOPRAZOLE SODIUM 40 MG PO TBEC
40.0000 mg | DELAYED_RELEASE_TABLET | Freq: Two times a day (BID) | ORAL | Status: DC
  Administered 2019-01-16 – 2019-01-17 (×2): 40 mg via ORAL
  Filled 2019-01-16 (×2): qty 1

## 2019-01-16 MED ORDER — SODIUM CHLORIDE 0.9 % IV SOLN
INTRAVENOUS | Status: DC | PRN
  Administered 2019-01-16: 1000 mL via INTRAVENOUS

## 2019-01-16 MED ORDER — EZETIMIBE 10 MG PO TABS
10.0000 mg | ORAL_TABLET | Freq: Every day | ORAL | Status: DC
  Administered 2019-01-17: 10 mg via ORAL
  Filled 2019-01-16 (×2): qty 1

## 2019-01-16 MED ORDER — AMLODIPINE BESYLATE 10 MG PO TABS
10.0000 mg | ORAL_TABLET | Freq: Every day | ORAL | Status: DC
  Administered 2019-01-16: 10 mg via ORAL
  Filled 2019-01-16: qty 1

## 2019-01-16 MED ORDER — GABAPENTIN 300 MG PO CAPS
300.0000 mg | ORAL_CAPSULE | Freq: Three times a day (TID) | ORAL | Status: DC
  Administered 2019-01-16 – 2019-01-17 (×3): 300 mg via ORAL
  Filled 2019-01-16 (×4): qty 1

## 2019-01-16 MED ORDER — POTASSIUM CHLORIDE 10 MEQ/100ML IV SOLN
10.0000 meq | INTRAVENOUS | Status: AC
  Administered 2019-01-16 (×2): 10 meq via INTRAVENOUS
  Filled 2019-01-16: qty 100

## 2019-01-16 MED ORDER — CLONIDINE HCL 0.2 MG PO TABS
0.2000 mg | ORAL_TABLET | Freq: Two times a day (BID) | ORAL | Status: DC
  Administered 2019-01-17: 0.2 mg via ORAL
  Filled 2019-01-16 (×2): qty 1

## 2019-01-16 MED ORDER — ROSUVASTATIN CALCIUM 20 MG PO TABS
40.0000 mg | ORAL_TABLET | Freq: Every day | ORAL | Status: DC
  Administered 2019-01-16: 40 mg via ORAL
  Filled 2019-01-16: qty 2

## 2019-01-16 MED ORDER — PENICILLIN V POTASSIUM 250 MG PO TABS
500.0000 mg | ORAL_TABLET | Freq: Four times a day (QID) | ORAL | Status: DC
  Administered 2019-01-16 – 2019-01-17 (×5): 500 mg via ORAL
  Filled 2019-01-16 (×5): qty 2

## 2019-01-16 MED ORDER — POTASSIUM CHLORIDE CRYS ER 10 MEQ PO TBCR
10.0000 meq | EXTENDED_RELEASE_TABLET | Freq: Every day | ORAL | Status: DC
  Administered 2019-01-17: 10 meq via ORAL
  Filled 2019-01-16: qty 1

## 2019-01-16 MED ORDER — FUROSEMIDE 20 MG PO TABS
20.0000 mg | ORAL_TABLET | Freq: Every day | ORAL | Status: DC
  Administered 2019-01-17: 20 mg via ORAL
  Filled 2019-01-16 (×2): qty 1

## 2019-01-16 MED ORDER — ACETAMINOPHEN 325 MG PO TABS
650.0000 mg | ORAL_TABLET | Freq: Four times a day (QID) | ORAL | Status: DC | PRN
  Administered 2019-01-16 – 2019-01-17 (×2): 650 mg via ORAL
  Filled 2019-01-16 (×2): qty 2
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
## 2019-01-16 NOTE — ED Triage Notes (Signed)
Onset yesterday pt got up from sofa, took a step and fell, tried to get up again from floor and fell forward hitting right side of head on wood floor. Pt states had LOC, pts cousin was shaking pt to wake up.   Small abrasion on forehead. Pt weighed self this morning, [employer] called pt to report weight was up, pt told nurse that she fell yesterday and nurse recommended ED.  Pt c/o headache.
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
## 2019-01-17 ENCOUNTER — Observation Stay (HOSPITAL_COMMUNITY): Payer: Medicare Other

## 2019-01-17 ENCOUNTER — Observation Stay (HOSPITAL_BASED_OUTPATIENT_CLINIC_OR_DEPARTMENT_OTHER): Payer: Medicare Other

## 2019-01-17 ENCOUNTER — Other Ambulatory Visit: Payer: Self-pay

## 2019-01-17 DIAGNOSIS — R55 Syncope and collapse: Secondary | ICD-10-CM | POA: Diagnosis not present

## 2019-01-17 DIAGNOSIS — I6529 Occlusion and stenosis of unspecified carotid artery: Secondary | ICD-10-CM

## 2019-01-17 DIAGNOSIS — N183 Chronic kidney disease, stage 3 (moderate): Secondary | ICD-10-CM | POA: Diagnosis not present

## 2019-01-17 DIAGNOSIS — I6523 Occlusion and stenosis of bilateral carotid arteries: Secondary | ICD-10-CM | POA: Diagnosis not present

## 2019-01-17 DIAGNOSIS — I2510 Atherosclerotic heart disease of native coronary artery without angina pectoris: Secondary | ICD-10-CM | POA: Diagnosis not present

## 2019-01-17 LAB — BASIC METABOLIC PANEL
ANION GAP: 7 (ref 5–15)
ANION GAP: 9 (ref 5–15)
Anion gap: 9 (ref 5–15)
BUN: 7 mg/dL — ABNORMAL LOW (ref 8–23)
BUN: 6 mg/dL — ABNORMAL LOW (ref 8–23)
BUN: 5 mg/dL — ABNORMAL LOW (ref 8–23)
CALCIUM: 9.3 mg/dL (ref 8.9–10.3)
CHLORIDE: 112 mmol/L — AB (ref 98–111)
CO2: 22 mmol/L (ref 22–32)
CO2: 19 mmol/L — ABNORMAL LOW (ref 22–32)
CO2: 20 mmol/L — ABNORMAL LOW (ref 22–32)
Calcium: 9 mg/dL (ref 8.9–10.3)
Calcium: 9.2 mg/dL (ref 8.9–10.3)
Chloride: 111 mmol/L (ref 98–111)
Chloride: 110 mmol/L (ref 98–111)
Creatinine, Ser: 1.52 mg/dL — ABNORMAL HIGH (ref 0.44–1.00)
Creatinine, Ser: 1.55 mg/dL — ABNORMAL HIGH (ref 0.44–1.00)
Creatinine, Ser: 1.51 mg/dL — ABNORMAL HIGH (ref 0.44–1.00)
GFR calc Af Amer: 42 mL/min — ABNORMAL LOW (ref >60)
GFR calc Af Amer: 41 mL/min — ABNORMAL LOW (ref >60)
GFR calc Af Amer: 42 mL/min — ABNORMAL LOW (ref >60)
GFR calc non Af Amer: 36 mL/min — ABNORMAL LOW (ref >60)
GFR calc non Af Amer: 36 mL/min — ABNORMAL LOW (ref >60)
GFR calc non Af Amer: 37 mL/min — ABNORMAL LOW (ref >60)
Glucose, Bld: 135 mg/dL — ABNORMAL HIGH (ref 70–99)
Glucose, Bld: 190 mg/dL — ABNORMAL HIGH (ref 70–99)
Glucose, Bld: 232 mg/dL — ABNORMAL HIGH (ref 70–99)
Potassium: 3.7 mmol/L (ref 3.5–5.1)
Potassium: 3.7 mmol/L (ref 3.5–5.1)
Potassium: 3.9 mmol/L (ref 3.5–5.1)
SODIUM: 140 mmol/L (ref 135–145)
Sodium: 140 mmol/L (ref 135–145)
Sodium: 139 mmol/L (ref 135–145)

## 2019-01-17 LAB — GLUCOSE, CAPILLARY
GLUCOSE-CAPILLARY: 174 mg/dL — AB (ref 70–99)
GLUCOSE-CAPILLARY: 209 mg/dL — AB (ref 70–99)
Glucose-Capillary: 123 mg/dL — ABNORMAL HIGH (ref 70–99)
Glucose-Capillary: 127 mg/dL — ABNORMAL HIGH (ref 70–99)
Glucose-Capillary: 129 mg/dL — ABNORMAL HIGH (ref 70–99)
Glucose-Capillary: 132 mg/dL — ABNORMAL HIGH (ref 70–99)
Glucose-Capillary: 134 mg/dL — ABNORMAL HIGH (ref 70–99)
Glucose-Capillary: 136 mg/dL — ABNORMAL HIGH (ref 70–99)
Glucose-Capillary: 137 mg/dL — ABNORMAL HIGH (ref 70–99)
Glucose-Capillary: 144 mg/dL — ABNORMAL HIGH (ref 70–99)
Glucose-Capillary: 144 mg/dL — ABNORMAL HIGH (ref 70–99)
Glucose-Capillary: 160 mg/dL — ABNORMAL HIGH (ref 70–99)
Glucose-Capillary: 187 mg/dL — ABNORMAL HIGH (ref 70–99)
Glucose-Capillary: 201 mg/dL — ABNORMAL HIGH (ref 70–99)
Glucose-Capillary: 237 mg/dL — ABNORMAL HIGH (ref 70–99)
Glucose-Capillary: 282 mg/dL — ABNORMAL HIGH (ref 70–99)
Glucose-Capillary: 285 mg/dL — ABNORMAL HIGH (ref 70–99)
Glucose-Capillary: 328 mg/dL — ABNORMAL HIGH (ref 70–99)

## 2019-01-17 LAB — ECHOCARDIOGRAM LIMITED
Height: 63 in
Weight: 2465.6 oz

## 2019-01-17 MED ORDER — OXYCODONE-ACETAMINOPHEN 10-325 MG PO TABS: 1.0000 | ORAL_TABLET | Freq: Three times a day (TID) | ORAL | Status: DC | PRN

## 2019-01-17 MED ORDER — OXYCODONE-ACETAMINOPHEN 5-325 MG PO TABS
1.0000 | ORAL_TABLET | Freq: Three times a day (TID) | ORAL | Status: DC | PRN
  Administered 2019-01-17: 1 via ORAL
  Filled 2019-01-17: qty 1

## 2019-01-17 MED ORDER — INSULIN GLARGINE 100 UNIT/ML ~~LOC~~ SOLN
90.0000 [IU] | Freq: Every day | SUBCUTANEOUS | Status: DC
  Administered 2019-01-17: 90 [IU] via SUBCUTANEOUS
  Filled 2019-01-17: qty 0.9

## 2019-01-17 MED ORDER — INSULIN ASPART 100 UNIT/ML ~~LOC~~ SOLN
0.0000 [IU] | Freq: Three times a day (TID) | SUBCUTANEOUS | Status: DC
  Administered 2019-01-17: 2 [IU] via SUBCUTANEOUS

## 2019-01-17 MED ORDER — INSULIN ASPART 100 UNIT/ML ~~LOC~~ SOLN
8.0000 [IU] | Freq: Three times a day (TID) | SUBCUTANEOUS | Status: DC
  Administered 2019-01-17: 8 [IU] via SUBCUTANEOUS

## 2019-01-17 MED ORDER — OXYCODONE HCL 5 MG PO TABS
5.0000 mg | ORAL_TABLET | Freq: Three times a day (TID) | ORAL | Status: DC | PRN
  Administered 2019-01-17: 5 mg via ORAL
  Filled 2019-01-17: qty 1

## 2019-01-17 NOTE — Discharge Instructions (Signed)
Syncope Syncope is when you pass out (faint) for a short time. It is caused by a sudden decrease in blood flow to the brain. Signs that you may be about to pass out include:  Feeling dizzy or light-headed.  Feeling sick to your stomach (nauseous).  Seeing all white or all black.  Having cold, clammy skin. If you pass out, get help right away. Call your local emergency services (911 in the [place] Do not drive yourself to the hospital. Follow these instructions at home: Watch for any changes in your symptoms. Take these actions to stay safe and help with your symptoms: Lifestyle  Do not drive, use machinery, or play sports until your doctor says it is okay.  Do not drink alcohol.  Do not use any products that contain nicotine or tobacco, such as cigarettes and e-cigarettes. If you need help quitting, ask your doctor.  Drink enough fluid to keep your pee (urine) pale yellow. General instructions  Take over-the-counter and prescription medicines only as told by your doctor.  If you are taking blood pressure or heart medicine, sit up and stand up slowly. Spend a few minutes getting ready to sit and then stand. This can help you feel less dizzy.  Have someone stay with you until you feel stable.  If you start to feel like you might pass out, lie down right away and raise (elevate) your feet above the level of your heart. Breathe deeply and steadily. Wait until all of the symptoms are gone.  Keep all follow-up visits as told by your doctor. This is important. Get help right away if:  You have a very bad headache.  You pass out once or more than once.  You have pain in your chest, belly, or back.  You have a very fast or uneven heartbeat (palpitations).  It hurts to breathe.  You are bleeding from your mouth or your bottom (rectum).  You have black or tarry poop (stool).  You have jerky movements that you cannot control (seizure).  You are confused.  You have trouble  walking.  You are very weak.  You have vision problems. These symptoms may be an emergency. Do not wait to see if the symptoms will go away. Get medical help right away. Call your local emergency services (911 in the [place] Do not drive yourself to the hospital. Summary  Syncope is when you pass out (faint) for a short time. It is caused by a sudden decrease in blood flow to the brain.  Signs that you may be about to faint include feeling dizzy, light-headed, or sick to your stomach, seeing all white or all black, or having cold, clammy skin.  If you start to feel like you might pass out, lie down right away and raise (elevate) your feet above the level of your heart. Breathe deeply and steadily. Wait until all of the symptoms are gone. This information is not intended to replace advice given to you by your health care provider. Make sure you discuss any questions you have with your health care provider. Document Released: 04/28/2008 Document Revised: 12/23/2017 Document Reviewed: 12/23/2017 Elsevier Interactive Patient Education  2019 Elsevier Inc.  

## 2019-01-17 NOTE — Progress Notes (Signed)
  Echocardiogram 2D Echocardiogram has been performed.  Alejandra Hernandez 01/17/2019, 2:44 PM

## 2019-01-17 NOTE — Progress Notes (Signed)
Carotid Duplex complete see CV proc for preliminary results can be found under CV Proc.  Darlina Sicilian RDCS

## 2019-01-17 NOTE — ED Provider Notes (Signed)
Patient will be admitted to the hospital for syncope and hyperglycemia.   Dalia Heading, PA-C 01/17/19 Mayer Camel, MD 01/19/19 [phone]

## 2019-01-17 NOTE — Telephone Encounter (Signed)
There is no problem with the insulin itself.  It is just a mater of getting the right amount.  Please increase to 160 units qd.  Please come back for a follow-up appointment in 1 week.

## 2019-01-17 NOTE — Telephone Encounter (Signed)
LVM requesting returned call 

## 2019-01-17 NOTE — Progress Notes (Signed)
Inpatient Diabetes Program Recommendations  AACE/ADA: New Consensus Statement on Inpatient Glycemic Control (2015)  Target Ranges:  Prepandial:   less than 140 mg/dL      Peak postprandial:   less than 180 mg/dL (1-2 hours)      Critically ill patients:  140 - 180 mg/dL   Review of Glycemic Control  Diabetes history: DM 1, per Endocrinology Notes Outpatient Diabetes medications: Tresiba 120 units Daily Current orders for Inpatient glycemic control: IV insulin gtt  Inpatient Diabetes Program Recommendations:    A1c 13.3% on 2/6  Spoke with patient about DM regimen and home management. Patient last saw Dr. Loanne Drilling on 2/6. Patient had been on Lantus 120 units and Humalog 5-7 units tid with meals. Patient has DM type 1 per Endocrinology notes, Patient would be best controlled with basal bolus regimen outpatient.  Patient reports getting ready to be on steroids this Thursday with her Neuropathy treatments.  Patient reports being best controlled when she was on Lantus and Humalog. Encouraged patient to call Dr. Cordelia Pen office today and inform them of the DKA admission and ask about restarting the Humalog.  Patient is currently on IV insulin gtt going at 11 units/hour. Patient will need a large portion of her home basal insulin at time of transition.  At time of transition consider Lantus 90 units + Novolog Moderate 0-15 units tid, Novolog 8 units tid meal coverage.  Thanks,  Tama Headings RN, MSN, BC-ADM Inpatient Diabetes Coordinator Team Pager [phone] (8a-5p)

## 2019-01-17 NOTE — Evaluation (Signed)
Physical Therapy Evaluation Patient Details Name: Alejandra Hernandez MRN: [id number] DOB: 04-26-1956 Today's Date: 01/17/2019   History of Present Illness  Patient is a 63 y/o female who presents after syncopal episode at home,+ LOC. CBG >500. Admitted with syncope and hyperlgycemia. PMH includes DM, HTN, back pain, CAD, NSTEMI, PAD, CKD, depression,   Clinical Impression  Patient presents with dizziness with changes in position, impaired balance, chronic back pain, headache and impaired mobility s/p above. Tolerated transfers and gait training with close min guard for balance/safety. Mobility limited today secondary to worsened back pain post BM, headache and dizziness. See vitals below. Supine BP 133/64  HR 76 bpm Sitting BP 104/77   HR 84 bpm Standing BP 128/63  HR 105 bpm  Pt symptomatic with each change in position. Education re: dizziness symptoms and importance of waiting a few mins prior to mobility when changing positions at home. Pt lives with roommate and son and is Mod I with ADls/IADLs PTA. Encouraged walking with nursing while in the hospital. Rn notified of BP drop. Will follow acutely to maximize independence and mobility prior to return home.     Follow Up Recommendations Home health PT;Supervision - Intermittent    Equipment Recommendations  None recommended by PT    Recommendations for Other Services       Precautions / Restrictions Precautions Precautions: Fall Precaution Comments: hypotensive Restrictions Weight Bearing Restrictions: No      Mobility  Bed Mobility Overal bed mobility: Needs Assistance Bed Mobility: Rolling;Sidelying to Sit Rolling: Min guard Sidelying to sit: Min guard;HOB elevated       General bed mobility comments: No assist needed, use of rails. Dizziness.   Transfers Overall transfer level: Needs assistance Equipment used: Straight cane Transfers: Sit to/from Stand Sit to Stand: Min guard         General transfer comment: Min  guard for safety. Stood from [employer], from toilet x1; transferred to chair post ambulation.  Ambulation/Gait Ambulation/Gait assistance: Min guard Gait Distance (Feet): 20 Feet(+ 35') Assistive device: Straight cane Gait Pattern/deviations: Step-to pattern;Step-through pattern;Decreased stride length;Shuffle Gait velocity: decreased Gait velocity interpretation: <1.8 ft/sec, indicate of risk for recurrent falls General Gait Details: Slow, mildly unsteady gait with SPC and reaching for counter as well for support; reports some mild dizziness.   Stairs            Wheelchair Mobility    Modified Rankin (Stroke Patients Only)       Balance Overall balance assessment: Needs assistance Sitting-balance support: Feet supported;No upper extremity supported Sitting balance-Leahy Scale: Good     Standing balance support: During functional activity Standing balance-Leahy Scale: Fair Standing balance comment: Able to perform static standing without UE support but does better with UE support for dynamic activities/walking.                             Pertinent Vitals/Pain Pain Assessment: 0-10 Pain Score: 7  Pain Location: back and headache Pain Descriptors / Indicators: Headache;Sore Pain Intervention(s): Monitored during session;Repositioned    Home Living Family/patient expects to be discharged to:: Private residence Living Arrangements: Other (Comment);Children(roommate; son) Available Help at Discharge: Family;Friend(s);Available PRN/intermittently Type of Home: House Home Access: Stairs to enter Entrance Stairs-Rails: Right Entrance Stairs-Number of Steps: 2 Home Layout: One level Home Equipment: Cane - single point;Shower seat;Bedside commode;Wheelchair - [employer] - 2 wheels      Prior Function Level of Independence: Independent with assistive device(s)  Comments: no driving; does a little cooking/cleaning.     Hand Dominance         Extremity/Trunk Assessment   Upper Extremity Assessment Upper Extremity Assessment: Defer to OT evaluation    Lower Extremity Assessment Lower Extremity Assessment: Overall WFL for tasks assessed       Communication   Communication: No difficulties  Cognition Arousal/Alertness: Awake/alert Behavior During Therapy: WFL for tasks assessed/performed Overall Cognitive Status: Impaired/Different from baseline Area of Impairment: Awareness                               General Comments: Seems to have poor awareness of medical condition- syncope, DM       General Comments      Exercises     Assessment/Plan    PT Assessment Patient needs continued PT services  PT Problem List Decreased strength;Decreased mobility;Decreased knowledge of precautions;Decreased balance;Pain;Cardiopulmonary status limiting activity;Decreased cognition;Decreased activity tolerance       PT Treatment Interventions Functional mobility training;Balance training;Patient/family education;Gait training;Therapeutic activities;Therapeutic exercise;Stair training    PT Goals (Current goals can be found in the Care Plan section)  Acute Rehab PT Goals Patient Stated Goal: to get home PT Goal Formulation: With patient Time For Goal Achievement: 01/31/19 Potential to Achieve Goals: Good    Frequency Min 3X/week   Barriers to discharge Decreased caregiver support      Co-evaluation               AM-PAC PT "6 Clicks" Mobility  Outcome Measure Help needed turning from your back to your side while in a flat bed without using bedrails?: A Little Help needed moving from lying on your back to sitting on the side of a flat bed without using bedrails?: A Little Help needed moving to and from a bed to a chair (including a wheelchair)?: A Little Help needed standing up from a chair using your arms (e.g., wheelchair or bedside chair)?: A Little Help needed to walk in hospital room?: A  Little Help needed climbing 3-5 steps with a railing? : A Little 6 Click Score: 18    End of Session Equipment Utilized During Treatment: Gait belt Activity Tolerance: Treatment limited secondary to medical complications (Comment);Patient limited by pain(drop in BP with sitting) Patient left: in chair;with call bell/phone within reach;with chair alarm set;with nursing/sitter in room Nurse Communication: Mobility status;Other (comment)(BP) PT Visit Diagnosis: Unsteadiness on feet (R26.81);Difficulty in walking, not elsewhere classified (R26.2);Dizziness and giddiness (R42)    Time: 1013-1040 PT Time Calculation (min) (ACUTE ONLY): 27 min   Charges:   PT Evaluation $PT Eval Moderate Complexity: 1 Mod PT Treatments $Therapeutic Activity: 8-22 mins        Wray Kearns, PT, DPT Acute Rehabilitation Services Pager [phone] Office [phone]      Marguarite Arbour A [name] Heck 01/17/2019, 11:43 AM

## 2019-01-17 NOTE — Care Management Note (Signed)
Case Management Note  Patient Details  Name: Alejandra Hernandez MRN: [id number] Date of Birth: 06/02/1956  Subjective/Objective:  Pt presented s/p fall. PTA from home with family support. Pt has used [facility] in the past. Wants to use them again. Medicare.Gov list provided to patient.                  Action/Plan: Referral sent to [facility]. SOC to begin within 24-48 hours post transition home.  Pt will need HH PT orders and F2F.    Expected Discharge Date:                  Expected Discharge Plan:  [place]  In-House Referral:  NA  Discharge planning Services  CM Consult  Post Acute Care Choice:  Home Health Choice offered to:  Patient  DME Arranged:  N/A DME Agency:  NA  HH Arranged:  PT HH Agency:  [place]  Status of Service:  Completed, signed off  If discussed at [place] of Stay Meetings, dates discussed:    Additional Comments:  Bethena Roys, RN 01/17/2019, 5:00 PM

## 2019-01-17 NOTE — Discharge Summary (Signed)
Physician Discharge Summary  Alejandra Hernandez:[id number] DOB: 14-Feb-1956 DOA: 01/16/2019  PCP: Antonietta Jewel, MD  Admit date: 01/16/2019 Discharge date: 01/17/2019  Time spent: 45 minutes  Recommendations for Outpatient Follow-up:  Patient will be discharged to home.  Patient will need to follow up with primary care provider within one week of discharge.  Follow up with endocrinology and vascular surgery. Patient should continue medications as prescribed.  Patient should follow a heart healthy/carb modified diet. Do not drive.   Discharge Diagnoses:  Principal Problem:Syncope Uncontrolled diabetes mellitus, type 1 with hyperglycemia Coronary artery disease Hypertension History of DVT on chronic anticoagulation Chronic kidney disease, stage 3 Carotid artery disease  Discharge Condition: Stable  Diet recommendation: heart healthy/carb modified  Filed Weights   01/16/19 1334 01/16/19 2004 01/17/19 0437  Weight: 71.7 kg 69.3 kg 69.9 kg    History of present illness:  On 01/16/2019 by Dr. Loma Boston Alejandra Hernandez is a 63 y.o. female with a history of type 1 diabetes is poorly controlled, coronary artery disease, carotid artery disease with stenosis of approximately 60 to 70%, CKD stage III, history of DVT on chronic anticoagulation, GERD, peripheral artery disease.  Patient had a syncopal episode yesterday and had a difficult time arousing.  This was witnessed by family member.  No seizure-like activity was noted.  She has never had an episode like this before.  No palliating or provoking factors.  She has had no further episodes, but does complain of pain in her head  She also notes increasing blood sugars over the past 3 to 4 days.  She did miss a dose of her long-acting insulin few days ago.  In reviewing the past notes from Dr. Loanne Drilling, it does appear that the patient is fairly noncompliant with her regimen and is not a candidate for multiple daily doses of insulin.  Her last  hemoglobin A1c was earlier this month and was noted to be 13.  Hospital Course:  Syncope -CT Head showed no acute abnormality -Carotid doppler B/L ICA 40-59%. Vertebral artery flow antegrade.  -Echocardiogram EF greater than 65%, moderate asymmetric left ventricular hypertrophy.  LV diastolic Doppler parameters are consistent with impaired relaxation. -Orthostatic vitals were positive from supine to sitting.  However BP responded well to standing. -PT recommended home health -Reviewed patient's carotid Doppler results with neurology.  Compared to MRA from 2015, patient does have carotid disease and will need to follow-up with vascular surgery as an outpatient. -Discussed this with the patient, she states that she already has a physician that she follows with.  Uncontrolled diabetes mellitus, type 1 with hyperglycemia -hemoglobin A1c 13.3 -patient was placed on insulin drip -Continue home regimen and follow up with endocrinologist  -Dr. Loanne Drilling placed a note the patient should increase her insulin 160 units daily.  She should follow-up within 1 week.  -Discussed this with the patient. She does not wish to do this. Feels medications are not helping.  Coronary artery disease -stable, no complaints of of chest pain  Hypertension -Continue amlodipine, clonidine, lasix   History of DVT on chronic anticoagulation -Continue Xarelto  Chronic kidney disease, stage 3 -Stable  Carotid artery disease -carotid doppler as above  Procedures: Echocardiogram Carotid doppler  Consultations: None  Discharge Exam: Vitals:   01/17/19 1051 01/17/19 1200  BP: 136/64 (!) 126/54  Pulse:  63  Resp:    Temp:    SpO2:  95%   Patient with known complaints at this point.  Would like to go  home.  Denies current chest pain, shortness of breath, abdominal pain, nausea vomiting, diarrhea constipation, dizziness or headache.   General: Well developed, well nourished, NAD, appears stated  age  HEENT: NCAT, mucous membranes moist.  Neck: Supple  Cardiovascular: S1 S2 auscultated, no rubs, murmurs or gallops. Regular rate and rhythm.  Respiratory: Clear to auscultation bilaterally with equal chest rise  Abdomen: Soft, nontender, nondistended, + bowel sounds  Extremities: warm dry without cyanosis clubbing or edema  Neuro: AAOx3, nonfocal  Psych: Appropriate mood and affect  Discharge Instructions Discharge Instructions    Discharge instructions   Complete by:  As directed    Patient will be discharged to home.  Patient will need to follow up with primary care provider within one week of discharge.  Follow up with endocrinology and vascular surgery. Patient should continue medications as prescribed.  Patient should follow a heart healthy/carb modified diet. Do not drive.     Allergies as of 01/17/2019   No Known Allergies     Medication List    TAKE these medications   ACCU-CHEK AVIVA device Use as instructed   acetaminophen 325 MG tablet Commonly known as:  TYLENOL Take 325 mg by mouth every 6 (six) hours as needed for headache (pain).   albuterol 108 (90 Base) MCG/ACT inhaler Commonly known as:  PROAIR HFA Inhale 2 puffs into the lungs every 4 (four) hours as needed for wheezing or shortness of breath.   ALPRAZolam 0.5 MG tablet Commonly known as:  XANAX Take 0.5 mg by mouth at bedtime.   amLODipine 10 MG tablet Commonly known as:  NORVASC Take 10 mg by mouth at bedtime.   amoxicillin 500 MG capsule Commonly known as:  AMOXIL Take 2,000 mg by mouth See admin instructions. Take 4 capsules (2000 mg) by mouth one hour prior to dental appointment   aspirin 81 MG chewable tablet Chew 1 tablet (81 mg total) by mouth daily.   cloNIDine 0.2 MG tablet Commonly known as:  CATAPRES Take 0.2 mg by mouth 2 (two) times daily.   diclofenac sodium 1 % Gel Commonly known as:  VOLTAREN Apply 4 g topically 4 (four) times daily. What changed:    how much  to take  when to take this  reasons to take this   ezetimibe 10 MG tablet Commonly known as:  ZETIA TAKE 1 TABLET(10 MG) BY MOUTH DAILY What changed:  See the new instructions.   ferrous sulfate 325 (65 FE) MG tablet Take 325 mg by mouth 2 (two) times daily with a meal.   furosemide 20 MG tablet Commonly known as:  LASIX Take 1 tablet (20 mg total) by mouth every morning. What changed:  when to take this   gabapentin 600 MG tablet Commonly known as:  NEURONTIN Take 0.5 tablets (300 mg total) by mouth 3 (three) times daily.   glucose blood test strip Commonly known as:  ACCU-CHEK AVIVA PLUS 1 each by Other route 2 (two) times daily. And lancets 2/day   hydroxypropyl methylcellulose / hypromellose 2.5 % ophthalmic solution Commonly known as:  ISOPTO TEARS / GONIOVISC Place 1 drop into both eyes 3 (three) times daily as needed for dry eyes.   Insulin Degludec 200 UNIT/ML Sopn Commonly known as:  TRESIBA FLEXTOUCH Inject 150 Units into the skin daily. And pen needles 1/day What changed:    how much to take  additional instructions   isosorbide mononitrate 30 MG 24 hr tablet Commonly known as:  IMDUR Take 3 tablets (  90 mg total) by mouth daily.   nitroGLYCERIN 0.4 MG SL tablet Commonly known as:  NITROSTAT PLACE 1 TABLET UNDER THE TONGUE EVERY 5 MINUTES AS NEEDED FOR CHEST PAIN What changed:  See the new instructions.   oxyCODONE-acetaminophen 10-325 MG tablet Commonly known as:  PERCOCET Take 1 tablet by mouth 3 (three) times daily.   pantoprazole 40 MG tablet Commonly known as:  PROTONIX TAKE 1 TABLET BY MOUTH TWICE DAILY BEFORE A MEAL What changed:  See the new instructions.   penicillin v potassium 500 MG tablet Commonly known as:  VEETID Take 500-1,000 mg by mouth 4 (four) times daily. Start date 01/07/19 - take 2 tablets (1000 mg) for 1st dose - then take 1 tablet (500 mg) by mouth every 6 hours until gone   potassium chloride SA 20 MEQ tablet Commonly  known as:  K-DUR,KLOR-CON Take 0.5 tablets (10 mEq total) by mouth daily.   rivaroxaban 20 MG Tabs tablet Commonly known as:  XARELTO Take 1 tablet (20 mg total) by mouth daily with supper.   rosuvastatin 40 MG tablet Commonly known as:  CRESTOR Take 40 mg by mouth at bedtime.   TUMS PO Take 2-3 tablets by mouth 3 (three) times daily as needed (acid indigestion).   Vitamin D (Ergocalciferol) 1.25 MG (50000 UT) Caps capsule Commonly known as:  DRISDOL Take 50,000 Units by mouth every Wednesday.      No Known Allergies Follow-up Information    [place] Follow up.   Specialty:  Home Health Services Why:  Physical Therapy Contact information: [address] [place] [address] [phone]        Antonietta Jewel, MD. Schedule an appointment as soon as possible for a visit in 1 week(s).   Specialty:  Internal Medicine Why:  Hospital follow up Contact information: [address] [place] [address] [phone]        Sueanne Margarita, MD .   Specialty:  Cardiology Contact information: [address] [place] [address] [phone]            The results of significant diagnostics from this hospitalization (including imaging, microbiology, ancillary and laboratory) are listed below for reference.    Significant Diagnostic Studies: Ct Head Wo Contrast  Result Date: 01/16/2019 CLINICAL DATA:  Patient fell yesterday and hit forehead on floor. EXAM: CT HEAD WITHOUT CONTRAST TECHNIQUE: Contiguous axial images were obtained from the base of the skull through the vertex without intravenous contrast. COMPARISON:  02/11/2016 FINDINGS: Brain: There is no evidence for acute hemorrhage, hydrocephalus, mass lesion, or abnormal extra-axial fluid collection. No definite CT evidence for acute infarction. Diffuse loss of parenchymal volume is consistent with atrophy. Vascular: No hyperdense vessel or unexpected  calcification. Skull: No evidence for fracture. No worrisome lytic or sclerotic lesion. Sinuses/Orbits: The visualized paranasal sinuses and mastoid air cells are clear. Visualized portions of the globes and intraorbital fat are unremarkable. Other: None. IMPRESSION: 1. No acute intracranial abnormality. 2. Atrophy. Electronically Signed   By: Misty Stanley M.D.   On: 01/16/2019 16:47   Dg Chest Port 1 View  Result Date: 01/16/2019 CLINICAL DATA:  On blood thinners. Shortness of breath for 1 week. Fall. Diabetes. Anemia. EXAM: PORTABLE CHEST 1 VIEW COMPARISON:  11/16/2018 CT. 11/16/2018 plain film. FINDINGS: Cervical spine fixation. Metallic fragments projecting over the left shoulder. Numerous leads and wires project over the chest. Patient rotated right. Midline trachea. Normal heart size. Atherosclerosis in  the transverse aorta. No pleural effusion or pneumothorax. Clear lungs. IMPRESSION: 1. No acute cardiopulmonary disease. 2. Aortic atherosclerosis. Electronically Signed   By: Abigail Miyamoto M.D.   On: 01/16/2019 15:32   Vas US Carotid  Result Date: 01/17/2019 Carotid Arterial Duplex Study Indications:  Carotid artery disease and Syncope. Risk Factors: Hypertension, hyperlipidemia, Diabetes, coronary artery disease,               PAD. Performing Technologist: Darlina Sicilian RDCS Supporting Technologist: Maudry Mayhew RDMS, RVT, RDCS  Examination Guidelines: A complete evaluation includes B-mode imaging, spectral Doppler, color Doppler, and power Doppler as needed of all accessible portions of each vessel. Bilateral testing is considered an integral part of a complete examination. Limited examinations for reoccurring indications may be performed as noted.  Right Carotid Findings: +----------+--------+--------+--------+--------------------------+--------+           PSV cm/sEDV cm/sStenosisDescribe                  Comments  +----------+--------+--------+--------+--------------------------+--------+ CCA Prox  263     14              smooth and heterogenous            +----------+--------+--------+--------+--------------------------+--------+ CCA Distal136     15              irregular and heterogenous         +----------+--------+--------+--------+--------------------------+--------+ ICA Prox  170     40      40-59%                                     +----------+--------+--------+--------+--------------------------+--------+ ICA Distal126     21              heterogenous and irregular         +----------+--------+--------+--------+--------------------------+--------+ ECA       155     3               irregular and heterogenous         +----------+--------+--------+--------+--------------------------+--------+ +----------+--------+-------+----------------+-------------------+           PSV cm/sEDV cmsDescribe        Arm Pressure (mmHG) +----------+--------+-------+----------------+-------------------+ [id number]            Multiphasic, WNL                    +----------+--------+-------+----------------+-------------------+ +---------+--------+--+--------+--+---------+ VertebralPSV cm/s88EDV cm/s19Antegrade +---------+--------+--+--------+--+---------+  Left Carotid Findings: +----------+--------+--------+--------+--------------------------+--------+           PSV cm/sEDV cm/sStenosisDescribe                  Comments +----------+--------+--------+--------+--------------------------+--------+ CCA Prox  184     31              irregular and heterogenous         +----------+--------+--------+--------+--------------------------+--------+ CCA Distal327     50              irregular and heterogenous         +----------+--------+--------+--------+--------------------------+--------+ ICA Prox  134     38      40-59%                                      +----------+--------+--------+--------+--------------------------+--------+ ICA Distal94      27                                                 +----------+--------+--------+--------+--------------------------+--------+  ECA       366     27                                                 +----------+--------+--------+--------+--------------------------+--------+ +----------+--------+--------+----------------+-------------------+ SubclavianPSV cm/sEDV cm/sDescribe        Arm Pressure (mmHG) +----------+--------+--------+----------------+-------------------+           158             Multiphasic, WNL                    +----------+--------+--------+----------------+-------------------+ +---------+--------+--+--------+-+---------+ VertebralPSV cm/s58EDV cm/s9Antegrade +---------+--------+--+--------+-+---------+    Preliminary     Microbiology: No results found for this or any previous visit (from the past 240 hour(s)).   Labs: Basic Metabolic Panel: Recent Labs  Lab 01/16/19 1348 01/16/19 2243 01/17/19 0212 01/17/19 0634 01/17/19 0920  NA 131* 139 140 140 139  K 3.9 4.8 3.7 3.7 3.9  CL 102 110 111 112* 110  CO2 18* 21* 22 19* 20*  GLUCOSE 539* 261* 135* 190* 232*  BUN 9 8 7* 6* 5*  CREATININE 1.77* 1.52* 1.52* 1.55* 1.51*  CALCIUM 8.8* 9.0 9.0 9.2 9.3   Liver Function Tests: No results for input(s): AST, ALT, ALKPHOS, BILITOT, PROT, ALBUMIN in the last 168 hours. No results for input(s): LIPASE, AMYLASE in the last 168 hours. No results for input(s): AMMONIA in the last 168 hours. CBC: Recent Labs  Lab 01/16/19 1348  WBC 6.0  HGB 9.2*  HCT 33.2*  MCV 71.2*  PLT 309   Cardiac Enzymes: No results for input(s): CKTOTAL, CKMB, CKMBINDEX, TROPONINI in the last 168 hours. BNP: BNP (last 3 results) Recent Labs    05/14/18 0816 11/16/18 1936 01/16/19 1348  BNP 71.7 106.4* 51.5    ProBNP (last 3 results) Recent Labs    12/14/18 0935  PROBNP  299*    CBG: Recent Labs  Lab 01/17/19 1326 01/17/19 1446 01/17/19 1534 01/17/19 1649 01/17/19 1745  GLUCAP 237* 136* 132* 134* 127*       Signed:  Alexanderjames Berg  [facility] 01/17/2019, 5:55 PM

## 2019-01-17 NOTE — Progress Notes (Signed)
PROGRESS NOTE    Alejandra Hernandez  PFX:[id number] DOB: [birth year] DOA: 01/16/2019 PCP: Antonietta Jewel, MD   Brief Narrative:  HPI On 01/16/2019 by Dr. Loma Boston Fransico Michael a 63 y.o.femalewith a history of type 1 diabetes is poorly controlled, coronary artery disease, carotid artery disease with stenosis of approximately 60 to 70%, CKD stage III, history of DVT on chronic anticoagulation, GERD, peripheral artery disease. Patient had a syncopal episode yesterday and had a difficult time arousing. This was witnessed by family member. No seizure-like activity was noted. She has never had an episode like this before. No palliating or provoking factors. She has had no further episodes, but does complain of pain in her head  She also notes increasing blood sugars over the past 3 to 4 days. She did miss a dose of her long-acting insulin few days ago. In reviewing the past notes from Dr. Loanne Drilling, it does appear that the patient is fairly noncompliant with her regimen and is not a candidate for multiple daily doses of insulin. Her last hemoglobin A1c was earlier this month and was noted to be 13. Assessment & Plan   Syncope -CT Head showed no acute abnormality -Carotid doppler and echocardiogram pending -Orthostatic vitals pending -PT consulted   Uncontrolled diabetes mellitus, type 1 with hyperglycemia -hemoglobin A1c 13.3 -patient was placed on insulin drip- will attempt to wean today -CO2 level currently 19  Coronary artery disease -stable, no complaints of of chest pain  Hypertension -Continue amlodipine, clonidine, lasix   History of DVT on chronic anticoagulation -Continue Xarelto  Chronic kidney disease, stage 3 -Stable, continue to monitor   Carotid artery disease -carotid doppler as above  DVT Prophylaxis  Xarelto  Code Status: Full  Family Communication: None at bedside  Disposition Plan: Observation. Pending workup. Likely home on  01/18/2019  Procedures: Echocardiogram Carotid doppler  Consultations: None  Antibiotics   Anti-infectives (From admission, onward)   Start     Dose/Rate Route Frequency Ordered Stop   01/16/19 2015  penicillin v potassium (VEETID) tablet 500 mg    Note to Pharmacy:  Start date 01/07/19 - take 2 tablets (1000 mg) for 1st dose - then take 1 tablet (500 mg) by mouth every 6 hours until gone     500 mg Oral Every 6 hours 01/16/19 2007 01/18/19 1759      Subjective:   Izell  seen and examined today.  Denies chest pain, shortness breath, abdominal pain, nausea or vomiting, diarrhea constipation, dizziness or headache.  Objective:   Vitals:   01/16/19 1334 01/16/19 1745 01/16/19 2004 01/17/19 0437  BP: (!) 155/76 (!) 165/64 (!) 165/59 107/60  Pulse: 82 (!) 55 73 (!) 54  Resp: 14 12  13   Temp: 97.9 F (36.6 C)  98 F (36.7 C) 97.7 F (36.5 C)  TempSrc: Oral  Oral Oral  SpO2: 98% 100% 99% 95%  Weight: 71.7 kg  69.3 kg 69.9 kg  Height: 5\' 3"  (1.6 m)  5\' 3"  (1.6 m)     Intake/Output Summary (Last 24 hours) at 01/17/2019 1003 Last data filed at 01/17/2019 0440 Gross per 24 hour  Intake 1100 ml  Output 1800 ml  Net -700 ml   Filed Weights   01/16/19 1334 01/16/19 2004 01/17/19 0437  Weight: 71.7 kg 69.3 kg 69.9 kg    Exam  General: Well developed, well nourished, NAD, appears stated age  HEENT: NCAT, mucous membranes moist.   Neck: Supple  Cardiovascular: S1 S2 auscultated, no  murmur, RRR  Respiratory: Clear to auscultation bilaterally  Abdomen: Soft, nontender, nondistended, + bowel sounds  Extremities: warm dry without cyanosis clubbing or edema  Neuro: AAOx3, nonfocal  Psych: Appropriate mood and affect, pleasant    Data Reviewed: I have personally reviewed following labs and imaging studies  CBC: Recent Labs  Lab 01/16/19 1348  WBC 6.0  HGB 9.2*  HCT 33.2*  MCV 71.2*  PLT 466   Basic Metabolic Panel: Recent Labs  Lab 01/16/19 1348  01/16/19 2243 01/17/19 0212 01/17/19 0634  NA 131* 139 140 140  K 3.9 4.8 3.7 3.7  CL 102 110 111 112*  CO2 18* 21* 22 19*  GLUCOSE 539* 261* 135* 190*  BUN 9 8 7* 6*  CREATININE 1.77* 1.52* 1.52* 1.55*  CALCIUM 8.8* 9.0 9.0 9.2   GFR: Estimated Creatinine Clearance: 35.3 mL/min (A) (by C-G formula based on SCr of 1.55 mg/dL (H)). Liver Function Tests: No results for input(s): AST, ALT, ALKPHOS, BILITOT, PROT, ALBUMIN in the last 168 hours. No results for input(s): LIPASE, AMYLASE in the last 168 hours. No results for input(s): AMMONIA in the last 168 hours. Coagulation Profile: No results for input(s): INR, PROTIME in the last 168 hours. Cardiac Enzymes: No results for input(s): CKTOTAL, CKMB, CKMBINDEX, TROPONINI in the last 168 hours. BNP (last 3 results) Recent Labs    12/14/18 0935  PROBNP 299*   HbA1C: No results for input(s): HGBA1C in the last 72 hours. CBG: Recent Labs  Lab 01/17/19 0517 01/17/19 0624 01/17/19 0721 01/17/19 0819 01/17/19 0930  GLUCAP 144* 160* 174* 209* 201*   Lipid Profile: No results for input(s): CHOL, HDL, LDLCALC, TRIG, CHOLHDL, LDLDIRECT in the last 72 hours. Thyroid Function Tests: No results for input(s): TSH, T4TOTAL, FREET4, T3FREE, THYROIDAB in the last 72 hours. Anemia Panel: No results for input(s): VITAMINB12, FOLATE, FERRITIN, TIBC, IRON, RETICCTPCT in the last 72 hours. Urine analysis:    Component Value Date/Time   COLORURINE STRAW (A) 01/16/2019 1449   APPEARANCEUR CLEAR 01/16/2019 1449   LABSPEC 1.006 01/16/2019 1449   PHURINE 6.0 01/16/2019 1449   GLUCOSEU >=500 (A) 01/16/2019 1449   HGBUR SMALL (A) 01/16/2019 1449   BILIRUBINUR NEGATIVE 01/16/2019 1449   KETONESUR NEGATIVE 01/16/2019 1449   PROTEINUR 100 (A) 01/16/2019 1449   UROBILINOGEN 0.2 10/08/2018 1333   NITRITE NEGATIVE 01/16/2019 1449   LEUKOCYTESUR NEGATIVE 01/16/2019 1449   Sepsis Labs: @LABRCNTIP (procalcitonin:4,lacticidven:4)  )No results  found for this or any previous visit (from the past 240 hour(s)).    Radiology Studies: Ct Head Wo Contrast  Result Date: 01/16/2019 CLINICAL DATA:  Patient fell yesterday and hit forehead on floor. EXAM: CT HEAD WITHOUT CONTRAST TECHNIQUE: Contiguous axial images were obtained from the base of the skull through the vertex without intravenous contrast. COMPARISON:  02/11/2016 FINDINGS: Brain: There is no evidence for acute hemorrhage, hydrocephalus, mass lesion, or abnormal extra-axial fluid collection. No definite CT evidence for acute infarction. Diffuse loss of parenchymal volume is consistent with atrophy. Vascular: No hyperdense vessel or unexpected calcification. Skull: No evidence for fracture. No worrisome lytic or sclerotic lesion. Sinuses/Orbits: The visualized paranasal sinuses and mastoid air cells are clear. Visualized portions of the globes and intraorbital fat are unremarkable. Other: None. IMPRESSION: 1. No acute intracranial abnormality. 2. Atrophy. Electronically Signed   By: Misty Stanley M.D.   On: 01/16/2019 16:47   Dg Chest Port 1 View  Result Date: 01/16/2019 CLINICAL DATA:  On blood thinners. Shortness of breath for 1  week. Fall. Diabetes. Anemia. EXAM: PORTABLE CHEST 1 VIEW COMPARISON:  11/16/2018 CT. 11/16/2018 plain film. FINDINGS: Cervical spine fixation. Metallic fragments projecting over the left shoulder. Numerous leads and wires project over the chest. Patient rotated right. Midline trachea. Normal heart size. Atherosclerosis in the transverse aorta. No pleural effusion or pneumothorax. Clear lungs. IMPRESSION: 1. No acute cardiopulmonary disease. 2. Aortic atherosclerosis. Electronically Signed   By: Abigail Miyamoto M.D.   On: 01/16/2019 15:32     Scheduled Meds: . ALPRAZolam  0.5 mg Oral QHS  . amLODipine  10 mg Oral QHS  . aspirin  81 mg Oral Daily  . cloNIDine  0.2 mg Oral BID  . ezetimibe  10 mg Oral Daily  . furosemide  20 mg Oral Daily  . gabapentin  300 mg  Oral TID  . isosorbide mononitrate  90 mg Oral Daily  . pantoprazole  40 mg Oral BID  . penicillin v potassium  500 mg Oral Q6H  . potassium chloride SA  10 mEq Oral Daily  . rivaroxaban  20 mg Oral Q supper  . rosuvastatin  40 mg Oral QHS  . sodium chloride flush  3 mL Intravenous Once   Continuous Infusions: . sodium chloride    . dextrose 5 % and 0.45% NaCl 125 mL/hr at 01/16/19 2034  . insulin 5.6 Units/hr (01/17/19 0931)     LOS: 0 days   Time Spent in minutes   30 minutes  Payten Beaumier D.O. on 01/17/2019 at 10:03 AM  Between 7am to 7pm - Please see pager noted on [email or web address]  After 7pm go to [email or web address]  And look for the night coverage person covering for me after hours  [facility] Office  [phone]

## 2019-01-17 NOTE — Care Management Obs Status (Signed)
[facility] [place] NOTIFICATION   Patient Details  Name: KERIANN RANKIN MRN: [id number] Date of Birth: 1956/03/22   Medicare Observation Status Notification Given:  Yes    Bethena Roys, RN 01/17/2019, 4:57 PM

## 2019-01-17 NOTE — Telephone Encounter (Signed)
Patient called and states "that the Alejandra Hernandez is not working well and that she need to go back to the short acting treatments.  She still has Lantus and Humalog and want to know if she can go back to them and if so what dosages are appropriate. Patient is currently in patient at hospital"

## 2019-01-17 NOTE — Telephone Encounter (Signed)
Please advise. Realize at this time pt will likely need to wait until discharge to ensure proper dosing. In addition, when would you prefer to see this pt AFTER discharge?

## 2019-01-18 ENCOUNTER — Ambulatory Visit: Payer: Medicare Other | Admitting: Sports Medicine

## 2019-01-18 NOTE — Telephone Encounter (Signed)
Final attempt to reach pt remains unsuccessful. Letter mailed

## 2019-01-18 NOTE — Telephone Encounter (Signed)
SECOND ATTEMPT:  Called pt to inform of new orders. LVM requesting returned call.

## 2019-01-28 ENCOUNTER — Ambulatory Visit (INDEPENDENT_AMBULATORY_CARE_PROVIDER_SITE_OTHER)
Admission: RE | Admit: 2019-01-28 | Discharge: 2019-01-28 | Disposition: A | Discharge status: Home / Self Care | Payer: Medicare Other | Source: Ambulatory Visit | Attending: Emergency Medicine | Admitting: Emergency Medicine

## 2019-01-28 DIAGNOSIS — R911 Solitary pulmonary nodule: Secondary | ICD-10-CM | POA: Diagnosis not present

## 2019-02-01 ENCOUNTER — Inpatient Hospital Stay: Payer: Medicare Other | Attending: Hematology and Oncology | Admitting: Hematology and Oncology

## 2019-02-01 ENCOUNTER — Inpatient Hospital Stay: Payer: Medicare Other

## 2019-02-01 DIAGNOSIS — Z791 Long term (current) use of non-steroidal anti-inflammatories (NSAID): Secondary | ICD-10-CM | POA: Diagnosis not present

## 2019-02-01 DIAGNOSIS — E1065 Type 1 diabetes mellitus with hyperglycemia: Secondary | ICD-10-CM

## 2019-02-01 DIAGNOSIS — Z86718 Personal history of other venous thrombosis and embolism: Secondary | ICD-10-CM | POA: Diagnosis not present

## 2019-02-01 DIAGNOSIS — Z794 Long term (current) use of insulin: Secondary | ICD-10-CM | POA: Diagnosis not present

## 2019-02-01 DIAGNOSIS — N183 Chronic kidney disease, stage 3 (moderate): Secondary | ICD-10-CM | POA: Diagnosis not present

## 2019-02-01 DIAGNOSIS — Z7984 Long term (current) use of oral hypoglycemic drugs: Secondary | ICD-10-CM

## 2019-02-01 DIAGNOSIS — Z7901 Long term (current) use of anticoagulants: Secondary | ICD-10-CM | POA: Diagnosis not present

## 2019-02-01 DIAGNOSIS — Z79899 Other long term (current) drug therapy: Secondary | ICD-10-CM | POA: Insufficient documentation

## 2019-02-01 DIAGNOSIS — Z7982 Long term (current) use of aspirin: Secondary | ICD-10-CM | POA: Insufficient documentation

## 2019-02-01 DIAGNOSIS — D509 Iron deficiency anemia, unspecified: Secondary | ICD-10-CM

## 2019-02-01 DIAGNOSIS — Z87891 Personal history of nicotine dependence: Secondary | ICD-10-CM | POA: Insufficient documentation

## 2019-02-01 LAB — IRON AND TIBC
Iron: 28 ug/dL — ABNORMAL LOW (ref 41–142)
Saturation Ratios: 8 % — ABNORMAL LOW (ref 21–57)
TIBC: 363 ug/dL (ref 236–444)
UIBC: 334 ug/dL (ref 120–384)

## 2019-02-01 LAB — CBC WITH DIFFERENTIAL (CANCER CENTER ONLY)
Abs Immature Granulocytes: 0.05 10*3/uL (ref 0.00–0.07)
Basophils Absolute: 0 10*3/uL (ref 0.0–0.1)
Basophils Relative: 0 %
EOS ABS: 0 10*3/uL (ref 0.0–0.5)
Eosinophils Relative: 0 %
HCT: 32.3 % — ABNORMAL LOW (ref 36.0–46.0)
Hemoglobin: 9.5 g/dL — ABNORMAL LOW (ref 12.0–15.0)
Immature Granulocytes: 0 %
Lymphocytes Relative: 22 %
Lymphs Abs: 2.6 10*3/uL (ref 0.7–4.0)
MCH: 20.2 pg — ABNORMAL LOW (ref 26.0–34.0)
MCHC: 29.4 g/dL — ABNORMAL LOW (ref 30.0–36.0)
MCV: 68.6 fL — ABNORMAL LOW (ref 80.0–100.0)
Monocytes Absolute: 0.6 10*3/uL (ref 0.1–1.0)
Monocytes Relative: 5 %
Neutro Abs: 8.7 10*3/uL — ABNORMAL HIGH (ref 1.7–7.7)
Neutrophils Relative %: 73 %
Platelet Count: 349 10*3/uL (ref 150–400)
RBC: 4.71 MIL/uL (ref 3.87–5.11)
RDW: 17.3 % — ABNORMAL HIGH (ref 11.5–15.5)
WBC Count: 12.1 10*3/uL — ABNORMAL HIGH (ref 4.0–10.5)
nRBC: 0 % (ref 0.0–0.2)

## 2019-02-01 LAB — VITAMIN B12: Vitamin B-12: 480 pg/mL (ref 180–914)

## 2019-02-01 LAB — RETICULOCYTES
Immature Retic Fract: 33.2 % — ABNORMAL HIGH (ref 2.3–15.9)
RBC.: 4.71 MIL/uL (ref 3.87–5.11)
RETIC COUNT ABSOLUTE: 67.4 10*3/uL (ref 19.0–186.0)
Retic Ct Pct: 1.4 % (ref 0.4–3.1)

## 2019-02-01 LAB — FOLATE: Folate: 5.3 ng/mL — ABNORMAL LOW (ref >5.9)

## 2019-02-01 LAB — FERRITIN: Ferritin: 15 ng/mL (ref 11–307)

## 2019-02-01 NOTE — Assessment & Plan Note (Addendum)
03/05/2018: Hemoglobin 9.9, MCV 68.3, RDW 15.5, creatinine 1.42 06/21/2018: Hemoglobin 9.5, MCV 67.7, RDW 18.8, creatinine 1.31 07/28/2018: Hemoglobin 10, MCV 68, RDW 18.1, creatinine 1.43 10/05/2018: Hemoglobin 10.6, MCV 69.7, RDW 18.1, creatinine 1.47 12/24/2018: Hemoglobin 9.1, MCV 73, RDW 16.4, ALC 5335 with a WBC of 11 01/16/2019: Hemoglobin 9.2, MCV 71.2, RDW 16.3, WBC 6  Differential diagnosis of microcytic anemia: 1.  Iron deficiency anemia 2.  Thalassemia 3.  Sideroblastic anemia  Work-up: 1.  Iron studies with ferritin 2.  Reticulocyte count 3.  Hemoglobin electrophoresis 4.  Erythropoietin level 5.  T90 and folic acid  Return to clinic in 1 week based on the results of this test.  If there is clear-cut iron deficiency then she will need IV iron replacement therapy and GI and GYN evaluations to evaluate for bleeding. If it is thalassemia related there is no specific treatment necessary.  In that case, I would suspect the cause of anemia to be anemia of chronic kidney disease.
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
## 2019-02-02 ENCOUNTER — Other Ambulatory Visit: Payer: Self-pay

## 2019-02-02 ENCOUNTER — Other Ambulatory Visit: Payer: Self-pay | Admitting: Hematology and Oncology

## 2019-02-02 ENCOUNTER — Inpatient Hospital Stay: Payer: Medicare Other

## 2019-02-02 VITALS — BP 108/60 | HR 49 | Temp 97.9°F | Resp 16

## 2019-02-02 DIAGNOSIS — D509 Iron deficiency anemia, unspecified: Secondary | ICD-10-CM | POA: Diagnosis not present

## 2019-02-02 LAB — ERYTHROPOIETIN: Erythropoietin: 18.2 m[IU]/mL (ref 2.6–18.5)

## 2019-02-02 MED ORDER — SODIUM CHLORIDE 0.9 % IV SOLN
Freq: Once | INTRAVENOUS | Status: AC
  Administered 2019-02-02: 08:00:00 via INTRAVENOUS
  Filled 2019-02-02: qty 250

## 2019-02-02 MED ORDER — SODIUM CHLORIDE 0.9 % IV SOLN
510.0000 mg | Freq: Once | INTRAVENOUS | Status: AC
  Administered 2019-02-02: 510 mg via INTRAVENOUS
  Filled 2019-02-02: qty 17

## 2019-02-02 NOTE — Patient Instructions (Signed)

## 2019-02-03 LAB — HEMOGLOBINOPATHY EVALUATION
HGB S QUANTITAION: 0 %
Hgb A2 Quant: 2.2 % (ref 1.8–3.2)
Hgb A: 97.8 % (ref 96.4–98.8)
Hgb C: 0 %
Hgb F Quant: 0 % (ref 0.0–2.0)
Hgb Variant: 0 %

## 2019-02-07 ENCOUNTER — Ambulatory Visit: Payer: Medicare Other

## 2019-02-07 ENCOUNTER — Ambulatory Visit: Payer: Medicare Other | Admitting: Hematology and Oncology

## 2019-02-08 ENCOUNTER — Ambulatory Visit (INDEPENDENT_AMBULATORY_CARE_PROVIDER_SITE_OTHER): Payer: Medicare Other | Admitting: Sports Medicine

## 2019-02-08 ENCOUNTER — Encounter: Payer: Self-pay | Admitting: Gastroenterology

## 2019-02-08 ENCOUNTER — Other Ambulatory Visit: Payer: Self-pay

## 2019-02-08 ENCOUNTER — Telehealth: Payer: Self-pay

## 2019-02-08 ENCOUNTER — Encounter: Payer: Self-pay | Admitting: Sports Medicine

## 2019-02-08 ENCOUNTER — Ambulatory Visit (INDEPENDENT_AMBULATORY_CARE_PROVIDER_SITE_OTHER): Payer: Medicare Other | Admitting: Gastroenterology

## 2019-02-08 VITALS — BP 116/80 | HR 61 | Temp 96.0°F | Ht 63.0 in | Wt 155.0 lb

## 2019-02-08 DIAGNOSIS — E1165 Type 2 diabetes mellitus with hyperglycemia: Secondary | ICD-10-CM

## 2019-02-08 DIAGNOSIS — M2040 Other hammer toe(s) (acquired), unspecified foot: Secondary | ICD-10-CM

## 2019-02-08 DIAGNOSIS — M2021 Hallux rigidus, right foot: Secondary | ICD-10-CM

## 2019-02-08 DIAGNOSIS — M79674 Pain in right toe(s): Secondary | ICD-10-CM

## 2019-02-08 DIAGNOSIS — R194 Change in bowel habit: Secondary | ICD-10-CM | POA: Diagnosis not present

## 2019-02-08 DIAGNOSIS — M205X1 Other deformities of toe(s) (acquired), right foot: Secondary | ICD-10-CM

## 2019-02-08 DIAGNOSIS — M2141 Flat foot [pes planus] (acquired), right foot: Secondary | ICD-10-CM

## 2019-02-08 DIAGNOSIS — D509 Iron deficiency anemia, unspecified: Secondary | ICD-10-CM

## 2019-02-08 DIAGNOSIS — B351 Tinea unguium: Secondary | ICD-10-CM | POA: Diagnosis not present

## 2019-02-08 DIAGNOSIS — IMO0002 Reserved for concepts with insufficient information to code with codable children: Secondary | ICD-10-CM

## 2019-02-08 DIAGNOSIS — M79675 Pain in left toe(s): Secondary | ICD-10-CM | POA: Diagnosis not present

## 2019-02-08 DIAGNOSIS — M2022 Hallux rigidus, left foot: Secondary | ICD-10-CM

## 2019-02-08 DIAGNOSIS — M2142 Flat foot [pes planus] (acquired), left foot: Secondary | ICD-10-CM

## 2019-02-08 DIAGNOSIS — E1140 Type 2 diabetes mellitus with diabetic neuropathy, unspecified: Secondary | ICD-10-CM | POA: Diagnosis not present

## 2019-02-08 MED ORDER — PEG 3350-KCL-NA BICARB-NACL 420 G PO SOLR: 4000.0000 mL | ORAL | 0 refills | Status: DC

## 2019-02-08 NOTE — Progress Notes (Signed)
Patient Care Team: Antonietta Jewel, MD as PCP - General (Internal Medicine) Sueanne Margarita, MD as PCP - Cardiology (Cardiology)  DIAGNOSIS:    ICD-10-CM   1. Microcytic anemia D50.9 Ferritin    Iron and TIBC    CBC with Differential (Cancer Center Only)  2. Iron deficiency anemia, unspecified iron deficiency anemia type D50.9     CHIEF COMPLIANT: Follow-up of moderate microcytic anemia to review labwork  INTERVAL HISTORY: Alejandra Hernandez is a 63 y.o. with above-mentioned history of moderate microcytic anemia. Her labs from 02/01/19 showed: erythropoietin 18.2, Hg 9.5, abs retic ct 67.4, B-12 480, folate 5.3, iron saturation 8%, ferritin 15. She presents to the clinic alone today and is fatigued. She denies any side effects of her iron infusion last week. She saw her gastroenterologist yesterday, who will do a colonoscopy and endoscopy next week to rule out bleeding. Her diet includes leafy green vegetables.  REVIEW OF SYSTEMS:   Constitutional: Denies fevers, chills or abnormal weight loss (+) fatigue Eyes: Denies blurriness of vision Ears, nose, mouth, throat, and face: Denies mucositis or sore throat Respiratory: Denies cough, dyspnea or wheezes Cardiovascular: Denies palpitation, chest discomfort Gastrointestinal: Denies nausea, heartburn or change in bowel habits Skin: Denies abnormal skin rashes Lymphatics: Denies new lymphadenopathy or easy bruising Neurological: Denies numbness, tingling or new weaknesses Behavioral/Psych: Mood is stable, no new changes  Extremities: No lower extremity edema Breast: denies any pain or lumps or nodules in either breasts All other systems were reviewed with the patient and are negative.  I have reviewed the past medical history, past surgical history, social history and family history with the patient and they are unchanged from previous note.  ALLERGIES:  has No Known Allergies.  MEDICATIONS:  Current Outpatient Medications  Medication Sig  Dispense Refill  . acetaminophen (TYLENOL) 325 MG tablet Take 325 mg by mouth every 6 (six) hours as needed for headache (pain).    Marland Kitchen albuterol (PROAIR HFA) 108 (90 Base) MCG/ACT inhaler Inhale 2 puffs into the lungs every 4 (four) hours as needed for wheezing or shortness of breath. 1 Inhaler 5  . ALPRAZolam (XANAX) 0.5 MG tablet Take 0.5 mg by mouth at bedtime.   2  . amLODipine (NORVASC) 10 MG tablet Take 10 mg by mouth at bedtime.    Marland Kitchen amoxicillin (AMOXIL) 500 MG capsule Take 2,000 mg by mouth See admin instructions. Take 4 capsules (2000 mg) by mouth one hour prior to dental appointment    . aspirin 81 MG chewable tablet Chew 1 tablet (81 mg total) by mouth daily. 30 tablet 3  . Blood Glucose Monitoring Suppl (ACCU-CHEK AVIVA) device Use as instructed 1 each 0  . Calcium Carbonate Antacid (TUMS PO) Take 2-3 tablets by mouth 3 (three) times daily as needed (acid indigestion).    . cloNIDine (CATAPRES) 0.2 MG tablet Take 0.2 mg by mouth 2 (two) times daily.    . diclofenac sodium (VOLTAREN) 1 % GEL Apply 4 g topically 4 (four) times daily. (Patient taking differently: Apply 1 application topically 2 (two) times daily as needed (knee, back and foot (heel) pain). ) 1 Tube 1  . ezetimibe (ZETIA) 10 MG tablet TAKE 1 TABLET(10 MG) BY MOUTH DAILY (Patient taking differently: Take 10 mg by mouth daily. ) 90 tablet 3  . ferrous sulfate 325 (65 FE) MG tablet Take 325 mg by mouth 2 (two) times daily with a meal.    . furosemide (LASIX) 20 MG tablet Take 1 tablet (  20 mg total) by mouth every morning. (Patient taking differently: Take 20 mg by mouth daily. ) 30 tablet 5  . gabapentin (NEURONTIN) 600 MG tablet Take 0.5 tablets (300 mg total) by mouth 3 (three) times daily. 90 tablet 2  . glucose blood (ACCU-CHEK AVIVA PLUS) test strip 1 each by Other route 2 (two) times daily. And lancets 2/day 200 each 3  . hydroxypropyl methylcellulose / hypromellose (ISOPTO TEARS / GONIOVISC) 2.5 % ophthalmic solution Place  1 drop into both eyes 3 (three) times daily as needed for dry eyes.    . Insulin Degludec (TRESIBA FLEXTOUCH) 200 UNIT/ML SOPN Inject 150 Units into the skin daily. And pen needles 1/day (Patient taking differently: Inject 60-120 Units into the skin daily. Inject 120 units subcutaneously daily with breakfast, inject 1/2 dose (60 units) for a small meal. And pen needles 1/day) 9 pen 11  . isosorbide mononitrate (IMDUR) 30 MG 24 hr tablet Take 3 tablets (90 mg total) by mouth daily. 270 tablet 3  . nitroGLYCERIN (NITROSTAT) 0.4 MG SL tablet PLACE 1 TABLET UNDER THE TONGUE EVERY 5 MINUTES AS NEEDED FOR CHEST PAIN (Patient taking differently: Place 0.4 mg under the tongue every 5 (five) minutes as needed for chest pain. ) 25 tablet 0  . oxyCODONE-acetaminophen (PERCOCET) 10-325 MG tablet Take 1 tablet by mouth 3 (three) times daily.   0  . pantoprazole (PROTONIX) 40 MG tablet TAKE 1 TABLET BY MOUTH TWICE DAILY BEFORE A MEAL (Patient taking differently: Take 40 mg by mouth 2 (two) times daily. ) 180 tablet 0  . penicillin v potassium (VEETID) 500 MG tablet Take 500-1,000 mg by mouth 4 (four) times daily. Start date 01/07/19 - take 2 tablets (1000 mg) for 1st dose - then take 1 tablet (500 mg) by mouth every 6 hours until gone    . polyethylene glycol-electrolytes (NULYTELY/GOLYTELY) 420 g solution Take 4,000 mLs by mouth as directed. 4000 mL 0  . potassium chloride SA (K-DUR,KLOR-CON) 20 MEQ tablet Take 0.5 tablets (10 mEq total) by mouth daily. 90 tablet 1  . rivaroxaban (XARELTO) 20 MG TABS tablet Take 1 tablet (20 mg total) by mouth daily with supper. 30 tablet 11  . rosuvastatin (CRESTOR) 40 MG tablet Take 40 mg by mouth at bedtime.    . Vitamin D, Ergocalciferol, (DRISDOL) 50000 units CAPS capsule Take 50,000 Units by mouth every Wednesday.   10   No current facility-administered medications for this visit.     PHYSICAL EXAMINATION: ECOG PERFORMANCE STATUS: 1 - Symptomatic but completely  ambulatory  Vitals:   02/09/19 0832  BP: (!) 129/55  Pulse: 61  Resp: 18  Temp: 97.8 F (36.6 C)  SpO2: 99%   Filed Weights   02/09/19 0832  Weight: 155 lb 1.6 oz (70.4 kg)    GENERAL: alert, no distress and comfortable SKIN: skin color, texture, turgor are normal, no rashes or significant lesions EYES: normal, Conjunctiva are pink and non-injected, sclera clear OROPHARYNX: no exudate, no erythema and lips, buccal mucosa, and tongue normal  NECK: supple, thyroid normal size, non-tender, without nodularity LYMPH: no palpable lymphadenopathy in the cervical, axillary or inguinal LUNGS: clear to auscultation and percussion with normal breathing effort HEART: regular rate & rhythm and no murmurs and no lower extremity edema ABDOMEN: abdomen soft, non-tender and normal bowel sounds MUSCULOSKELETAL: no cyanosis of digits and no clubbing  NEURO: alert & oriented x 3 with fluent speech, no focal motor/sensory deficits EXTREMITIES: No lower extremity edema  LABORATORY DATA:  I have reviewed the data as listed CMP Latest Ref Rng & Units 01/17/2019 01/17/2019 01/17/2019  Glucose 70 - 99 mg/dL 232(H) 190(H) 135(H)  Hernandez 8 - 23 mg/dL 5(L) 6(L) 7(L)  Creatinine 0.44 - 1.00 mg/dL 1.51(H) 1.55(H) 1.52(H)  Sodium 135 - 145 mmol/L 139 140 140  Potassium 3.5 - 5.1 mmol/L 3.9 3.7 3.7  Chloride 98 - 111 mmol/L 110 112(H) 111  CO2 22 - 32 mmol/L 20(L) 19(L) 22  Calcium 8.9 - 10.3 mg/dL 9.3 9.2 9.0  Total Protein 6.0 - 8.5 g/dL - - -  Total Bilirubin 0.0 - 1.2 mg/dL - - -  Alkaline Phos 39 - 117 IU/L - - -  AST 0 - 40 IU/L - - -  ALT 0 - 32 IU/L - - -    Lab Results  Component Value Date   WBC 12.1 (H) 02/01/2019   HGB 9.5 (L) 02/01/2019   HCT 32.3 (L) 02/01/2019   MCV 68.6 (L) 02/01/2019   PLT 349 02/01/2019   NEUTROABS 8.7 (H) 02/01/2019    ASSESSMENT & PLAN:  Iron deficiency anemia 03/05/2018: Hemoglobin 9.9, MCV 68.3, RDW 15.5, creatinine 1.42 06/21/2018: Hemoglobin 9.5, MCV 67.7,  RDW 18.8, creatinine 1.31 07/28/2018: Hemoglobin 10, MCV 68, RDW 18.1, creatinine 1.43 10/05/2018: Hemoglobin 10.6, MCV 69.7, RDW 18.1, creatinine 1.47 12/24/2018: Hemoglobin 9.1, MCV 73, RDW 16.4, ALC 5335 with a WBC of 11 01/16/2019: Hemoglobin 9.2, MCV 71.2, RDW 16.3, WBC 6  Lab review 02/01/2019: Hemoglobin 9.5, MCV 68.6, iron saturation 8%, ferritin 15 Hemoglobin electrophoresis: Normal adult hemoglobin Folic acid 5.3, H03: 888  Recommendation: 1.  IV iron therapy first dose given 07/06/2019, today's second dose 2.  GI and GYN consultation: Patient is getting a colonoscopy and upper endoscopy next week  Return to clinic in 6 months with labs and follow-up    Orders Placed This Encounter  Procedures  . Ferritin    Standing Status:   Future    Standing Expiration Date:   02/09/2020  . Iron and TIBC    Standing Status:   Future    Standing Expiration Date:   02/09/2020  . CBC with Differential (Cancer Center Only)    Standing Status:   Future    Standing Expiration Date:   02/09/2020   The patient has a good understanding of the overall plan. she agrees with it. she will call with any problems that may develop before the next visit here.  Nicholas Lose, MD 02/09/2019  Julious Oka Dorshimer am acting as scribe for Dr. Nicholas Lose.  I have reviewed the above documentation for accuracy and completeness, and I agree with the above.

## 2019-02-08 NOTE — Telephone Encounter (Signed)
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 Last stent placed 09/2018.  For endoscopy procedure office policy would have patient hold Xarelto 1 day prior to procedure. I will forward to Dr. Fletcher Anon for his opinion on holding anticoagulation.

## 2019-02-08 NOTE — Telephone Encounter (Signed)
[facility] Pre-operative Risk Assessment     Request for surgical clearance:     Endoscopy Procedure  What type of surgery is being performed?     Colonoscopy EGD  When is this surgery scheduled?     02/17/19  What type of clearance is required ?   Pharmacy  Are there any medications that need to be held prior to surgery and how long? [place] name and name of physician performing surgery?      [facility]  What is your office phone and fax number?      Phone- [phone]  [phone]  Anesthesia type (None, local, MAC, general) ?       MAC

## 2019-02-08 NOTE — Patient Instructions (Addendum)
You will be set up for a colonoscopy. You will be set up for an upper endoscopy.  These will be at [facility] given your other health conditions.  You will need to stop your blood thinner for 2 days prior to the testing (will discuss this with your cardiologist as well).  Thank you for entrusting me with your care and choosing [facility].  Dr Ardis Hughs

## 2019-02-08 NOTE — Telephone Encounter (Signed)
   Primary Cardiologist: Fransico Him, MD  Chart reviewed as part of pre-operative protocol coverage. Will route to pharm for input on Xarelto then pt will need call. She is on this for DVT. I had previously asked her to review duration with her PCP in a previous OV but appears our office is filling it.  Charlie Pitter, PA-C 02/08/2019, 11:50 AM

## 2019-02-08 NOTE — Progress Notes (Signed)
Subjective: Alejandra Hernandez is a 63 y.o. female patient with history of diabetes who presents to office today complaining of long,mildly painful nails  while ambulating in shoes; unable to trim. Patient states that the glucose reading this morning was 245mg /dl. Last A1c 13.3. Went to ED on 2/23 for a fall. Patient denies any new changes in medication or new problems. Patient denies any new cramping, numbness, burning or tingling in the legs. Admits to neuropathy but heel lifts we gave her last visit helped and is wanting diabetic shoes to see if this will help her feet as well.  Patient Active Problem List   Diagnosis Date Noted  . Syncope and collapse 01/16/2019  . Intractable vomiting with nausea 11/18/2018  . Atypical chest pain 11/16/2018  . Troponin level elevated 11/16/2018  . Type 1 diabetes ([place]) 05/23/2018  . CKD (chronic kidney disease), stage III ([place]) 05/14/2018  . Unstable angina ([place]) 05/14/2018  . Hemoptysis 02/24/2018  . Hyperglycemia 12/23/2017  . Low TSH level 06/22/2017  . CAD (coronary artery disease)   . Nodule on liver 05/22/2017  . Coronary artery calcification seen on CAT scan 05/14/2017  . Heart palpitations 05/14/2017  . Unspecified skin changes 04/18/2017  . History of tobacco use 03/17/2017  . Benign neoplasm of descending colon   . Benign neoplasm of sigmoid colon   . Chronic diastolic CHF (congestive heart failure) ([place]) 02/09/2017  . History of DVT (deep vein thrombosis)   . Abnormal CT scan, chest   . Acute urinary retention   . Claudication ([place]) 07/23/2016  . PAD (peripheral artery disease) ([place])   . Essential hypertension 05/14/2016  . Hyponatremia   . GERD (gastroesophageal reflux disease) 05/17/2014  . Carotid artery disease ([place]) 05/17/2014  . Microcytic anemia 04/26/2014  . Chest pain 04/25/2014  . Dyspnea 04/25/2014  . Carotid artery bruit 04/25/2014  . Hyperlipidemia with target low density lipoprotein (LDL) cholesterol less than 70 mg/dL    . Gout    Current Outpatient Medications on File Prior to Visit  Medication Sig Dispense Refill  . acetaminophen (TYLENOL) 325 MG tablet Take 325 mg by mouth every 6 (six) hours as needed for headache (pain).    Marland Kitchen albuterol (PROAIR HFA) 108 (90 Base) MCG/ACT inhaler Inhale 2 puffs into the lungs every 4 (four) hours as needed for wheezing or shortness of breath. 1 Inhaler 5  . ALPRAZolam (XANAX) 0.5 MG tablet Take 0.5 mg by mouth at bedtime.   2  . amLODipine (NORVASC) 10 MG tablet Take 10 mg by mouth at bedtime.    Marland Kitchen amoxicillin (AMOXIL) 500 MG capsule Take 2,000 mg by mouth See admin instructions. Take 4 capsules (2000 mg) by mouth one hour prior to dental appointment    . aspirin 81 MG chewable tablet Chew 1 tablet (81 mg total) by mouth daily. 30 tablet 3  . Blood Glucose Monitoring Suppl (ACCU-CHEK AVIVA) device Use as instructed 1 each 0  . Calcium Carbonate Antacid (TUMS PO) Take 2-3 tablets by mouth 3 (three) times daily as needed (acid indigestion).    . cloNIDine (CATAPRES) 0.2 MG tablet Take 0.2 mg by mouth 2 (two) times daily.    . diclofenac sodium (VOLTAREN) 1 % GEL Apply 4 g topically 4 (four) times daily. (Patient taking differently: Apply 1 application topically 2 (two) times daily as needed (knee, back and foot (heel) pain). ) 1 Tube 1  . ezetimibe (ZETIA) 10 MG tablet TAKE 1 TABLET(10 MG) BY MOUTH DAILY (Patient taking  differently: Take 10 mg by mouth daily. ) 90 tablet 3  . ferrous sulfate 325 (65 FE) MG tablet Take 325 mg by mouth 2 (two) times daily with a meal.    . furosemide (LASIX) 20 MG tablet Take 1 tablet (20 mg total) by mouth every morning. (Patient taking differently: Take 20 mg by mouth daily. ) 30 tablet 5  . gabapentin (NEURONTIN) 600 MG tablet Take 0.5 tablets (300 mg total) by mouth 3 (three) times daily. 90 tablet 2  . glucose blood (ACCU-CHEK AVIVA PLUS) test strip 1 each by Other route 2 (two) times daily. And lancets 2/day 200 each 3  . hydroxypropyl  methylcellulose / hypromellose (ISOPTO TEARS / GONIOVISC) 2.5 % ophthalmic solution Place 1 drop into both eyes 3 (three) times daily as needed for dry eyes.    . Insulin Degludec (TRESIBA FLEXTOUCH) 200 UNIT/ML SOPN Inject 150 Units into the skin daily. And pen needles 1/day (Patient taking differently: Inject 60-120 Units into the skin daily. Inject 120 units subcutaneously daily with breakfast, inject 1/2 dose (60 units) for a small meal. And pen needles 1/day) 9 pen 11  . isosorbide mononitrate (IMDUR) 30 MG 24 hr tablet Take 3 tablets (90 mg total) by mouth daily. 270 tablet 3  . nitroGLYCERIN (NITROSTAT) 0.4 MG SL tablet PLACE 1 TABLET UNDER THE TONGUE EVERY 5 MINUTES AS NEEDED FOR CHEST PAIN (Patient taking differently: Place 0.4 mg under the tongue every 5 (five) minutes as needed for chest pain. ) 25 tablet 0  . oxyCODONE-acetaminophen (PERCOCET) 10-325 MG tablet Take 1 tablet by mouth 3 (three) times daily.   0  . pantoprazole (PROTONIX) 40 MG tablet TAKE 1 TABLET BY MOUTH TWICE DAILY BEFORE A MEAL (Patient taking differently: Take 40 mg by mouth 2 (two) times daily. ) 180 tablet 0  . penicillin v potassium (VEETID) 500 MG tablet Take 500-1,000 mg by mouth 4 (four) times daily. Start date 01/07/19 - take 2 tablets (1000 mg) for 1st dose - then take 1 tablet (500 mg) by mouth every 6 hours until gone    . polyethylene glycol-electrolytes (NULYTELY/GOLYTELY) 420 g solution Take 4,000 mLs by mouth as directed. 4000 mL 0  . potassium chloride SA (K-DUR,KLOR-CON) 20 MEQ tablet Take 0.5 tablets (10 mEq total) by mouth daily. 90 tablet 1  . rivaroxaban (XARELTO) 20 MG TABS tablet Take 1 tablet (20 mg total) by mouth daily with supper. 30 tablet 11  . rosuvastatin (CRESTOR) 40 MG tablet Take 40 mg by mouth at bedtime.    . Vitamin D, Ergocalciferol, (DRISDOL) 50000 units CAPS capsule Take 50,000 Units by mouth every Wednesday.   10   No current facility-administered medications on file prior to visit.     No Known Allergies    Objective: General: Patient is awake, alert, and oriented x 3 and in no acute distress.  Integument: Skin is warm, dry and supple bilateral. Nails are tender, long, thickened and  dystrophic with subungual debris, consistent with onychomycosis, 1-5 bilateral. No signs of infection. No open lesions or preulcerative lesions present bilateral. Remaining integument unremarkable.  Vasculature:  Dorsalis Pedis pulse 1/4 bilateral. Posterior Tibial pulse  1/4 bilateral.  Capillary fill time <3 sec 1-5 bilateral. Positive hair growth to the level of the digits. Temperature gradient within normal limits. No varicosities present bilateral. No edema present bilateral.   Neurology: The patient has diminished sensation measured with a 5.07/10g Semmes Weinstein Monofilament at all pedal sites bilateral. Vibratory sensation diminished bilateral  with tuning fork. No Babinski sign present bilateral.   Musculoskeletal: Asymptomatic hallux limitus, pes planus, hammertoe pedal deformities noted bilateral. Muscular strength 4/5 in all lower extremity muscular groups bilateral without pain on range of motion . No tenderness with calf compression bilateral.  Assessment and Plan: Problem List Items Addressed This Visit    None    Visit Diagnoses    Pain due to onychomycosis of toenails of both feet    -  Primary   Type 2 diabetes, uncontrolled, with neuropathy (HCC)       Acquired hallux limitus of both feet       Hammer toe, unspecified laterality       Pes planus of both feet          -Examined patient. -Discussed and educated patient on diabetic foot care, especially with  regards to the vascular, neurological and musculoskeletal systems.  -Stressed the importance of good glycemic control and the detriment of not  controlling glucose levels in relation to the foot. -Mechanically debrided all nails 1-5 bilateral using sterile nail nipper and filed with dremel without incident   -[employer] diabetic shoe order form was completed; office to contact primary care for approval / certification;  Office to arrange shoe fitting and dispensing. -Answered all patient questions -Patient to return  in 3 months for at risk foot nail care and diabetic shoe measurements as scheduled -Patient advised to call the office if any problems or questions arise in the meantime.  Landis Martins, DPM

## 2019-02-08 NOTE — Telephone Encounter (Signed)
Agree with holding Xarelto 1 day prior to procedure.
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
## 2019-02-09 ENCOUNTER — Inpatient Hospital Stay: Payer: Medicare Other

## 2019-02-09 ENCOUNTER — Telehealth: Payer: Self-pay | Admitting: Hematology and Oncology

## 2019-02-09 ENCOUNTER — Inpatient Hospital Stay (HOSPITAL_BASED_OUTPATIENT_CLINIC_OR_DEPARTMENT_OTHER): Payer: Medicare Other | Admitting: Hematology and Oncology

## 2019-02-09 VITALS — BP 143/53 | HR 57 | Temp 97.6°F | Resp 18

## 2019-02-09 DIAGNOSIS — Z791 Long term (current) use of non-steroidal anti-inflammatories (NSAID): Secondary | ICD-10-CM

## 2019-02-09 DIAGNOSIS — Z7901 Long term (current) use of anticoagulants: Secondary | ICD-10-CM

## 2019-02-09 DIAGNOSIS — D509 Iron deficiency anemia, unspecified: Secondary | ICD-10-CM

## 2019-02-09 DIAGNOSIS — Z86718 Personal history of other venous thrombosis and embolism: Secondary | ICD-10-CM

## 2019-02-09 DIAGNOSIS — E065 Other chronic thyroiditis: Secondary | ICD-10-CM

## 2019-02-09 DIAGNOSIS — Z87891 Personal history of nicotine dependence: Secondary | ICD-10-CM

## 2019-02-09 DIAGNOSIS — Z79899 Other long term (current) drug therapy: Secondary | ICD-10-CM

## 2019-02-09 DIAGNOSIS — N183 Chronic kidney disease, stage 3 (moderate): Secondary | ICD-10-CM | POA: Diagnosis not present

## 2019-02-09 DIAGNOSIS — Z794 Long term (current) use of insulin: Secondary | ICD-10-CM

## 2019-02-09 DIAGNOSIS — Z7982 Long term (current) use of aspirin: Secondary | ICD-10-CM

## 2019-02-09 MED ORDER — SODIUM CHLORIDE 0.9 % IV SOLN
Freq: Once | INTRAVENOUS | Status: AC
  Administered 2019-02-09: 09:00:00 via INTRAVENOUS
  Filled 2019-02-09: qty 250

## 2019-02-09 MED ORDER — SODIUM CHLORIDE 0.9 % IV SOLN
510.0000 mg | Freq: Once | INTRAVENOUS | Status: AC
  Administered 2019-02-09: 510 mg via INTRAVENOUS
  Filled 2019-02-09: qty 17

## 2019-02-09 NOTE — Assessment & Plan Note (Addendum)
03/05/2018: Hemoglobin 9.9, MCV 68.3, RDW 15.5, creatinine 1.42 06/21/2018: Hemoglobin 9.5, MCV 67.7, RDW 18.8, creatinine 1.31 07/28/2018: Hemoglobin 10, MCV 68, RDW 18.1, creatinine 1.43 10/05/2018: Hemoglobin 10.6, MCV 69.7, RDW 18.1, creatinine 1.47 12/24/2018: Hemoglobin 9.1, MCV 73, RDW 16.4, ALC 5335 with a WBC of 11 01/16/2019: Hemoglobin 9.2, MCV 71.2, RDW 16.3, WBC 6  Lab review 02/01/2019: Hemoglobin 9.5, MCV 68.6, iron saturation 8%, ferritin 15 Hemoglobin electrophoresis: Normal adult hemoglobin Folic acid 5.3, C02: 217  Recommendation: 1.  IV iron therapy first dose given 07/06/2019, today's second dose 2.  GI and GYN consultation: Patient is getting a colonoscopy and upper endoscopy next week  Return to clinic in 6 months with labs and follow-up

## 2019-02-09 NOTE — Patient Instructions (Signed)

## 2019-02-09 NOTE — Telephone Encounter (Signed)
Gave avs and calendar ° °

## 2019-02-09 NOTE — Telephone Encounter (Signed)
Spoke to patient. Informed her of Dr Naomie Dean recommendation to hold Xarelto 1 day prior to 02/17/19 colonoscopy procedure. Patient voiced understanding

## 2019-02-15 ENCOUNTER — Telehealth: Payer: Self-pay

## 2019-02-15 NOTE — Telephone Encounter (Signed)
The pt has been advised and will arrive at 9 am

## 2019-02-15 NOTE — Telephone Encounter (Signed)
yes

## 2019-02-15 NOTE — Telephone Encounter (Signed)
Dr Ardis Hughs the pt has an appt for endo colon 3/26.  Do you want her to stay on or reschedule?
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
## 2019-02-17 ENCOUNTER — Ambulatory Visit (HOSPITAL_COMMUNITY): Payer: Medicare Other | Admitting: Anesthesiology

## 2019-02-17 ENCOUNTER — Other Ambulatory Visit: Payer: Self-pay

## 2019-02-17 ENCOUNTER — Encounter (HOSPITAL_COMMUNITY): Payer: Self-pay | Admitting: Gastroenterology

## 2019-02-17 ENCOUNTER — Encounter (HOSPITAL_COMMUNITY)
Admission: RE | Disposition: A | Discharge status: Home / Self Care | Payer: Self-pay | Source: Home / Self Care | Attending: Gastroenterology

## 2019-02-17 ENCOUNTER — Ambulatory Visit (HOSPITAL_COMMUNITY)
Admission: RE | Admit: 2019-02-17 | Discharge: 2019-02-17 | Disposition: A | Discharge status: Home / Self Care | Payer: Medicare Other | Attending: Gastroenterology | Admitting: Gastroenterology

## 2019-02-17 DIAGNOSIS — I129 Hypertensive chronic kidney disease with stage 1 through stage 4 chronic kidney disease, or unspecified chronic kidney disease: Secondary | ICD-10-CM | POA: Insufficient documentation

## 2019-02-17 DIAGNOSIS — K529 Noninfective gastroenteritis and colitis, unspecified: Secondary | ICD-10-CM | POA: Diagnosis not present

## 2019-02-17 DIAGNOSIS — Z86010 Personal history of colonic polyps: Secondary | ICD-10-CM | POA: Diagnosis not present

## 2019-02-17 DIAGNOSIS — M1990 Unspecified osteoarthritis, unspecified site: Secondary | ICD-10-CM | POA: Diagnosis not present

## 2019-02-17 DIAGNOSIS — N183 Chronic kidney disease, stage 3 (moderate): Secondary | ICD-10-CM | POA: Insufficient documentation

## 2019-02-17 DIAGNOSIS — E1122 Type 2 diabetes mellitus with diabetic chronic kidney disease: Secondary | ICD-10-CM | POA: Diagnosis not present

## 2019-02-17 DIAGNOSIS — Z951 Presence of aortocoronary bypass graft: Secondary | ICD-10-CM | POA: Insufficient documentation

## 2019-02-17 DIAGNOSIS — E1151 Type 2 diabetes mellitus with diabetic peripheral angiopathy without gangrene: Secondary | ICD-10-CM | POA: Diagnosis not present

## 2019-02-17 DIAGNOSIS — R194 Change in bowel habit: Secondary | ICD-10-CM

## 2019-02-17 DIAGNOSIS — I2510 Atherosclerotic heart disease of native coronary artery without angina pectoris: Secondary | ICD-10-CM | POA: Insufficient documentation

## 2019-02-17 DIAGNOSIS — Z7901 Long term (current) use of anticoagulants: Secondary | ICD-10-CM | POA: Diagnosis not present

## 2019-02-17 DIAGNOSIS — D123 Benign neoplasm of transverse colon: Secondary | ICD-10-CM | POA: Diagnosis not present

## 2019-02-17 DIAGNOSIS — F419 Anxiety disorder, unspecified: Secondary | ICD-10-CM | POA: Insufficient documentation

## 2019-02-17 DIAGNOSIS — F329 Major depressive disorder, single episode, unspecified: Secondary | ICD-10-CM | POA: Insufficient documentation

## 2019-02-17 DIAGNOSIS — D509 Iron deficiency anemia, unspecified: Secondary | ICD-10-CM

## 2019-02-17 DIAGNOSIS — K635 Polyp of colon: Secondary | ICD-10-CM

## 2019-02-17 HISTORY — PX: POLYPECTOMY: SHX5525

## 2019-02-17 HISTORY — PX: COLONOSCOPY WITH PROPOFOL: SHX5780

## 2019-02-17 HISTORY — PX: ESOPHAGOGASTRODUODENOSCOPY (EGD) WITH PROPOFOL: SHX5813

## 2019-02-17 HISTORY — PX: BIOPSY: SHX5522

## 2019-02-17 LAB — GLUCOSE, CAPILLARY: Glucose-Capillary: 239 mg/dL — ABNORMAL HIGH (ref 70–99)

## 2019-02-17 SURGERY — COLONOSCOPY WITH PROPOFOL
Anesthesia: General

## 2019-02-17 MED ORDER — PROPOFOL 10 MG/ML IV BOLUS
INTRAVENOUS | Status: DC | PRN
  Administered 2019-02-17: 140 mg via INTRAVENOUS

## 2019-02-17 MED ORDER — LACTATED RINGERS IV SOLN
INTRAVENOUS | Status: DC
  Administered 2019-02-17: 09:00:00 via INTRAVENOUS

## 2019-02-17 MED ORDER — FENTANYL CITRATE (PF) 100 MCG/2ML IJ SOLN
INTRAMUSCULAR | Status: AC
  Filled 2019-02-17: qty 2

## 2019-02-17 MED ORDER — FENTANYL CITRATE (PF) 100 MCG/2ML IJ SOLN
INTRAMUSCULAR | Status: DC | PRN
  Administered 2019-02-17 (×2): 50 ug via INTRAVENOUS

## 2019-02-17 MED ORDER — ONDANSETRON HCL 4 MG/2ML IJ SOLN
INTRAMUSCULAR | Status: DC | PRN
  Administered 2019-02-17: 4 mg via INTRAVENOUS

## 2019-02-17 MED ORDER — LIDOCAINE 2% (20 MG/ML) 5 ML SYRINGE
INTRAMUSCULAR | Status: DC | PRN
  Administered 2019-02-17: 100 mg via INTRAVENOUS

## 2019-02-17 MED ORDER — SUCCINYLCHOLINE CHLORIDE 200 MG/10ML IV SOSY
PREFILLED_SYRINGE | INTRAVENOUS | Status: DC | PRN
  Administered 2019-02-17: 120 mg via INTRAVENOUS

## 2019-02-17 MED ORDER — SODIUM CHLORIDE 0.9 % IV SOLN: INTRAVENOUS | Status: DC

## 2019-02-17 MED ORDER — PROPOFOL 10 MG/ML IV BOLUS
INTRAVENOUS | Status: AC
  Filled 2019-02-17: qty 20

## 2019-02-17 SURGICAL SUPPLY — 24 items

## 2019-02-17 NOTE — Anesthesia Procedure Notes (Signed)
Procedure Name: Intubation Date/Time: 02/17/2019 11:03 AM Performed by: Maxwell Caul, CRNA Pre-anesthesia Checklist: Patient identified, Emergency Drugs available, Suction available and Patient being monitored Patient Re-evaluated:Patient Re-evaluated prior to induction Oxygen Delivery Method: Circle system utilized Preoxygenation: Pre-oxygenation with 100% oxygen Induction Type: IV induction Laryngoscope Size: Mac and 3 Grade View: Grade I Tube type: Oral Tube size: 7.5 mm Number of attempts: 1 Airway Equipment and Method: Stylet Placement Confirmation: ETT inserted through vocal cords under direct vision,  positive ETCO2 and breath sounds checked- equal and bilateral Secured at: 21 cm Tube secured with: Tape Dental Injury: Teeth and Oropharynx as per pre-operative assessment

## 2019-02-17 NOTE — Op Note (Signed)
[facility] Patient Name: Alejandra Hernandez Procedure Date: 02/17/2019 MRN: [id number] Attending MD: Milus Banister , MD Date of Birth: 09/07/1956 CSN: [id number] Age: 63 Admit Type: Outpatient Procedure:                Upper GI endoscopy Indications:              Iron deficiency anemia Providers:                Milus Banister, MD, Glori Bickers, RN, Carlyn Reichert, RN, Elspeth Cho Tech., Technician, Virgia Land, CRNA Referring MD:              Medicines:                General Anesthesia Complications:            No immediate complications. Estimated blood loss:                            None. Estimated Blood Loss:     Estimated blood loss: none. Procedure:                Pre-Anesthesia Assessment:                           - Prior to the procedure, a History and Physical                            was performed, and patient medications and                            allergies were reviewed. The patient's tolerance of                            previous anesthesia was also reviewed. The risks                            and benefits of the procedure and the sedation                            options and risks were discussed with the patient.                            All questions were answered, and informed consent                            was obtained. Prior Anticoagulants: The patient has                            taken Xarelto (rivaroxaban), last dose was 2 days                            prior to  procedure. ASA Grade Assessment: IV - A                            patient with severe systemic disease that is a                            constant threat to life. After reviewing the risks                            and benefits, the patient was deemed in                            satisfactory condition to undergo the procedure.                           After obtaining informed consent, the endoscope was                            passed under direct vision. Throughout the                            procedure, the patient's blood pressure, pulse, and                            oxygen saturations were monitored continuously. The                            GIF-H190 ([id number] Olympus gastroscope was                            introduced through the mouth, and advanced to the                            second part of duodenum. The upper GI endoscopy was                            accomplished without difficulty. The patient                            tolerated the procedure well. Scope In: Scope Out: Findings:      The esophagus was normal.      The stomach was normal.      The examined duodenum was normal. Impression:               - Normal UGI tract. Moderate Sedation:      Not Applicable - Patient had care per Anesthesia. Recommendation:           - Patient has a contact number available for                            emergencies. The signs and symptoms of potential                            delayed complications were discussed with the  patient. Return to normal activities tomorrow.                            Written discharge instructions were provided to the                            patient.                           - Resume previous diet.                           - Continue present medications. Procedure Code(s):        --- Professional ---                           [phone], Esophagogastroduodenoscopy, flexible,                            transoral; diagnostic, including collection of                            specimen(s) by brushing or washing, when performed                            (separate procedure) Diagnosis Code(s):        --- Professional ---                           D50.9, Iron deficiency anemia, unspecified CPT copyright 2019 American Medical Association. All rights reserved. The codes documented in this report are preliminary and upon  coder review may  be revised to meet current compliance requirements. Milus Banister, MD 02/17/2019 11:44:15 AM This report has been signed electronically. Number of Addenda: 0

## 2019-02-17 NOTE — Anesthesia Postprocedure Evaluation (Signed)
Anesthesia Post Note  Patient: Alejandra Hernandez  Procedure(s) Performed: COLONOSCOPY WITH PROPOFOL (N/A ) ESOPHAGOGASTRODUODENOSCOPY (EGD) WITH PROPOFOL (N/A ) BIOPSY POLYPECTOMY     Patient location during evaluation: PACU Anesthesia Type: General Level of consciousness: sedated and patient cooperative Pain management: pain level controlled Vital Signs Assessment: post-procedure vital signs reviewed and stable Respiratory status: spontaneous breathing Cardiovascular status: stable Anesthetic complications: no    Last Vitals:  Vitals:   02/17/19 1154 02/17/19 1200  BP: (!) 149/64 (!) 156/66  Pulse: 84 77  Resp: 16 13  Temp:    SpO2: 96% 97%    Last Pain:  Vitals:   02/17/19 1200  TempSrc:   PainSc: 0-No pain                 [name]

## 2019-02-17 NOTE — Op Note (Signed)
[facility] Patient Name: Alejandra Hernandez Procedure Date: 02/17/2019 MRN: [id number] Attending MD: Milus Banister , MD Date of Birth: [birth year] CSN: [id number] Age: 63 Admit Type: Outpatient Procedure:                Colonoscopy Indications:              Iron deficiency anemia, Multiple precancerous colon                            polyps:9 adenomatous polyps (up to 1-2cm in size)                            on colonoscopy 01/2017 Dr. Ardis Hughs, recall at 3                            years. Also looser than usual stools Providers:                Milus Banister, MD, Glori Bickers, RN, Carlyn Reichert, RN, Elspeth Cho Tech., Technician, Virgia Land, CRNA Referring MD:              Medicines:                Monitored Anesthesia Care Complications:            No immediate complications. Estimated blood loss:                            None. Estimated Blood Loss:     Estimated blood loss: none. Procedure:                Pre-Anesthesia Assessment:                           - Prior to the procedure, a History and Physical                            was performed, and patient medications and                            allergies were reviewed. The patient's tolerance of                            previous anesthesia was also reviewed. The risks                            and benefits of the procedure and the sedation                            options and risks were discussed with the patient.  All questions were answered, and informed consent                            was obtained. Prior Anticoagulants: The patient has                            taken Xarelto (rivaroxaban), last dose was 2 days                            prior to procedure. ASA Grade Assessment: IV - A                            patient with severe systemic disease that is a                            constant threat to life.  After reviewing the risks                            and benefits, the patient was deemed in                            satisfactory condition to undergo the procedure.                           After obtaining informed consent, the colonoscope                            was passed under direct vision. Throughout the                            procedure, the patient's blood pressure, pulse, and                            oxygen saturations were monitored continuously. The                            CF-HQ190L ([id number] Olympus colonoscope was                            introduced through the anus and advanced to the the                            cecum, identified by appendiceal orifice and                            ileocecal valve. The colonoscopy was performed                            without difficulty. The patient tolerated the                            procedure well. The quality of the bowel  preparation was good. The ileocecal valve,                            appendiceal orifice, and rectum were photographed. Scope In: 11:10:22 AM Scope Out: 11:22:35 AM Scope Withdrawal Time: 0 hours 7 minutes 58 seconds  Total Procedure Duration: 0 hours 12 minutes 13 seconds  Findings:      A 3 mm polyp was found in the transverse colon. The polyp was sessile.       The polyp was removed with a cold snare. Resection and retrieval were       complete.      There was apparent very mild inflammation throughout the colon (erythema       and several pinpoint erosions, seemed most impressive in the left       colon). Biopsies for histology were taken with a cold forceps from the       entire colon.      I could not intubate the terminal ileum.      The exam was otherwise without abnormality on direct and retroflexion       views. Impression:               - One 3 mm polyp in the transverse colon, removed                            with a cold snare. Resected and  retrieved.                           - There was apparent very mild inflammation                            throughout the colon (erythema and several pinpoint                            erosions, seemed most impressive in the left                            colon). Biopsies for histology were taken with a                            cold forceps from the entire colon. Perhap very                            mild IBD (UC?) Moderate Sedation:      Not Applicable - Patient had care per Anesthesia. Recommendation:           - Patient has a contact number available for                            emergencies. The signs and symptoms of potential                            delayed complications were discussed with the                            patient. Return to normal activities tomorrow.  Written discharge instructions were provided to the                            patient.                           - Resume previous diet.                           - Continue present medications. OK to restart you                            xarelto tomorrow.                           - Repeat colonoscopy is recommended. The                            colonoscopy date will be determined after pathology                            results from today's exam become available for                            review.                           - EGD now. Procedure Code(s):        --- Professional ---                           [phone], Colonoscopy, flexible; with removal of                            tumor(s), polyp(s), or other lesion(s) by snare                            technique                           45380, 67, Colonoscopy, flexible; with biopsy,                            single or multiple Diagnosis Code(s):        --- Professional ---                           K63.5, Polyp of colon                           D50.9, Iron deficiency anemia, unspecified CPT copyright 2019 American  Medical Association. All rights reserved. The codes documented in this report are preliminary and upon coder review may  be revised to meet current compliance requirements. Milus Banister, MD 02/17/2019 11:28:14 AM This report has been signed electronically. Number of Addenda: 0

## 2019-02-17 NOTE — Interval H&P Note (Signed)
History and Physical Interval Note:  02/17/2019 9:25 AM  Alejandra Hernandez  has presented today for surgery, with the diagnosis of change in bowel habits.  The various methods of treatment have been discussed with the patient and family. After consideration of risks, benefits and other options for treatment, the patient has consented to  Procedure(s): COLONOSCOPY WITH PROPOFOL (N/A) ESOPHAGOGASTRODUODENOSCOPY (EGD) WITH PROPOFOL (N/A) as a surgical intervention.  The patient's history has been reviewed, patient examined, no change in status, stable for surgery.  I have reviewed the patient's chart and labs.  Questions were answered to the patient's satisfaction.     Milus Banister

## 2019-02-17 NOTE — Transfer of Care (Signed)
Immediate Anesthesia Transfer of Care Note  Patient: Alejandra Hernandez  Procedure(s) Performed: COLONOSCOPY WITH PROPOFOL (N/A ) ESOPHAGOGASTRODUODENOSCOPY (EGD) WITH PROPOFOL (N/A ) BIOPSY POLYPECTOMY  Patient Location: PACU  Anesthesia Type:General  Level of Consciousness: sedated  Airway & Oxygen Therapy: Patient Spontanous Breathing and Patient connected to face mask oxygen  Post-op Assessment: Report given to RN and Post -op Vital signs reviewed and stable  Post vital signs: Reviewed and stable  Last Vitals:  Vitals Value Taken Time  BP    Temp    Pulse 101 02/17/2019 11:41 AM  Resp 14 02/17/2019 11:41 AM  SpO2 100 % 02/17/2019 11:41 AM  Vitals shown include unvalidated device data.  Last Pain:  Vitals:   02/17/19 0900  TempSrc: Oral  PainSc: 7          Complications: No apparent anesthesia complications

## 2019-02-17 NOTE — Anesthesia Preprocedure Evaluation (Addendum)
Anesthesia Evaluation  Patient identified by MRN, date of birth, ID band Patient awake    Reviewed: Allergy & Precautions, NPO status , Patient's Chart, lab work & pertinent test results  Airway Mallampati: III  TM Distance: <3 FB Neck ROM: Full    Dental  (+) Edentulous Upper, Dental Advisory Given, Poor Dentition, Missing   Pulmonary shortness of breath, pneumonia, former smoker,    breath sounds clear to auscultation       Cardiovascular hypertension, + angina + CAD, + Past MI, + Peripheral Vascular Disease and +CHF   Rhythm:Regular Rate:Normal  Echo 12/2018  1. The left ventricle has hyperdynamic systolic function, with an ejection fraction of >65%. The cavity size was normal. There is moderate asymmetric left ventricular hypertrophy. Left ventricular diastolic Doppler parameters are consistent with  impaired relaxation No evidence of left ventricular regional wall motion abnormalities.  2. The right ventricle has normal systolc function. The cavity was normal. There is no increase in right ventricular wall thickness.  3. Left atrial size was mildly dilated.  4. There is mild mitral annular calcification present.  5. The aortic valve is tricuspid Mild thickening of the aortic valve Moderate calcification of the aortic valve.  FINDINGS  Left Ventricle: The left ventricle has hyperdynamic systolic function, with an ejection fraction of >65%. The cavity size was normal. There is moderate asymmetric left ventricular hypertrophy. Left ventricular diastolic Doppler parameters are consistent  with impaired relaxation (grade I) No evidence of left ventricular regional wall motion abnormalities. Right Ventricle: The right ventricle has normal systolic function. The cavity was normal. There is no increase in right ventricular wall thickness. Left Atrium: Left atrial size was mildly dilated. Right Atrium: Right atrial size was normal in size.  Right atrial pressure is estimated at 3 mmHg. Interatrial Septum: No atrial level shunt detected by color flow Doppler. Pericardium: There is no evidence of pericardial effusion. Mitral Valve: There is mild mitral annular calcification present. Mitral valve regurgitation is mild by color flow Doppler. Tricuspid Valve: The tricuspid valve was normal in structure. Tricuspid valve regurgitation is trivial by color flow Doppler. Aortic Valve: The aortic valve is tricuspid Mild thickening of the aortic valve Moderate calcification of the aortic valve. Aortic valve regurgitation was not visualized by color flow Doppler. Pulmonic Valve: The pulmonic valve was normal in structure. Pulmonic valve regurgitation is not visualized by color flow Doppler. Aorta: There is evidence of plaque in the ascending aorta. Venous: The inferior vena cava is dilated in size with greater than 50% respiratory variability.  NM Stress 12/2018  The left ventricular ejection fraction is hyperdynamic (>65%).  Nuclear stress EF: 66%. No wall motion abnormalities noted  There was no ST segment deviation noted during stress.  The study is normal.  This is a low risk study. No perfusion defects identified.    Neuro/Psych  Headaches, PSYCHIATRIC DISORDERS Anxiety Depression  Neuromuscular disease    GI/Hepatic PUD, GERD  Controlled,  Endo/Other  diabetes  Renal/GU Renal InsufficiencyRenal disease     Musculoskeletal  (+) Arthritis ,   Abdominal (+) + obese,   Peds  Hematology  (+) anemia ,   Anesthesia Other Findings   Reproductive/Obstetrics                            Anesthesia Physical  Anesthesia Plan  ASA: IV  Anesthesia Plan: General   Post-op Pain Management:    Induction: Intravenous and Rapid sequence  PONV Risk Score and Plan: 2 and Ondansetron and Propofol  Airway Management Planned: Oral ETT  Additional Equipment: None  Intra-op Plan:   Post-operative  Plan: Extubation in OR  Informed Consent:     Dental advisory given  Plan Discussed with: CRNA  Anesthesia Plan Comments:         Anesthesia Quick Evaluation

## 2019-02-17 NOTE — Discharge Instructions (Signed)
YOU HAD AN ENDOSCOPIC PROCEDURE TODAY: Refer to the procedure report and other information in the discharge instructions given to you for any specific questions about what was found during the examination. If this information does not answer your questions, please call [place] office at [phone] to clarify.  ° °YOU SHOULD EXPECT: Some feelings of bloating in the abdomen. Passage of more gas than usual. Walking can help get rid of the air that was put into your GI tract during the procedure and reduce the bloating. If you had a lower endoscopy (such as a colonoscopy or flexible sigmoidoscopy) you may notice spotting of blood in your stool or on the toilet paper. Some abdominal soreness may be present for a day or two, also. ° °DIET: Your first meal following the procedure should be a light meal and then it is ok to progress to your normal diet. A half-sandwich or bowl of soup is an example of a good first meal. Heavy or fried foods are harder to digest and may make you feel nauseous or bloated. Drink plenty of fluids but you should avoid alcoholic beverages for 24 hours. If you had a esophageal dilation, please see attached instructions for diet.   ° °ACTIVITY: Your care partner should take you home directly after the procedure. You should plan to take it easy, moving slowly for the rest of the day. You can resume normal activity the day after the procedure however YOU SHOULD NOT DRIVE, use power tools, machinery or perform tasks that involve climbing or major physical exertion for 24 hours (because of the sedation medicines used during the test).  ° °SYMPTOMS TO REPORT IMMEDIATELY: °A gastroenterologist can be reached at any hour. Please call [phone]  for any of the following symptoms:  °Following lower endoscopy (colonoscopy, flexible sigmoidoscopy) °Excessive amounts of blood in the stool  °Significant tenderness, worsening of abdominal pains  °Swelling of the abdomen that is new, acute  °Fever of 100° or  higher  °Following upper endoscopy (EGD, EUS, ERCP, esophageal dilation) °Vomiting of blood or coffee ground material  °New, significant abdominal pain  °New, significant chest pain or pain under the shoulder blades  °Painful or persistently difficult swallowing  °New shortness of breath  °Black, tarry-looking or red, bloody stools ° °FOLLOW UP:  °If any biopsies were taken you will be contacted by phone or by letter within the next 1-3 weeks. Call [phone]  if you have not heard about the biopsies in 3 weeks.  °Please also call with any specific questions about appointments or follow up tests. ° °

## 2019-02-18 ENCOUNTER — Encounter (HOSPITAL_COMMUNITY): Payer: Self-pay | Admitting: Gastroenterology

## 2019-02-21 ENCOUNTER — Other Ambulatory Visit: Payer: Self-pay

## 2019-02-21 DIAGNOSIS — D649 Anemia, unspecified: Secondary | ICD-10-CM

## 2019-02-24 ENCOUNTER — Other Ambulatory Visit: Payer: Medicare Other | Admitting: Orthotics

## 2019-03-07 ENCOUNTER — Ambulatory Visit: Payer: Medicare Other | Admitting: Emergency Medicine

## 2019-03-09 ENCOUNTER — Ambulatory Visit: Payer: Medicare Other | Admitting: Endocrinology

## 2019-03-25 ENCOUNTER — Ambulatory Visit (HOSPITAL_COMMUNITY)
Admission: EM | Admit: 2019-03-25 | Discharge: 2019-03-25 | Disposition: A | Discharge status: Home / Self Care | Payer: Medicare Other | Attending: Family Medicine | Admitting: Family Medicine

## 2019-03-25 ENCOUNTER — Encounter (HOSPITAL_COMMUNITY): Payer: Self-pay

## 2019-03-25 ENCOUNTER — Other Ambulatory Visit: Payer: Self-pay

## 2019-03-25 DIAGNOSIS — M5412 Radiculopathy, cervical region: Secondary | ICD-10-CM

## 2019-03-25 DIAGNOSIS — E109 Type 1 diabetes mellitus without complications: Secondary | ICD-10-CM | POA: Diagnosis not present

## 2019-03-25 DIAGNOSIS — M4722 Other spondylosis with radiculopathy, cervical region: Secondary | ICD-10-CM | POA: Diagnosis not present

## 2019-03-25 MED ORDER — TIZANIDINE HCL 4 MG PO TABS: 4.0000 mg | ORAL_TABLET | Freq: Four times a day (QID) | ORAL | 0 refills | Status: DC | PRN

## 2019-03-25 MED ORDER — KETOROLAC TROMETHAMINE 30 MG/ML IJ SOLN
INTRAMUSCULAR | Status: AC
  Filled 2019-03-25: qty 1

## 2019-03-25 MED ORDER — KETOROLAC TROMETHAMINE 30 MG/ML IJ SOLN
30.0000 mg | Freq: Once | INTRAMUSCULAR | Status: AC
  Administered 2019-03-25: 30 mg via INTRAMUSCULAR

## 2019-03-25 MED ORDER — PREDNISONE 20 MG PO TABS: 20.0000 mg | ORAL_TABLET | Freq: Two times a day (BID) | ORAL | 0 refills | Status: DC

## 2019-03-25 NOTE — Discharge Instructions (Signed)
Limit use of your arm while it is painful.  Wear sling for the next 2 to 3 days only. Put ice pack up on the left shoulder/neck area for 20 minutes every couple of hours Take prednisone 2 times a day for 5 days.  Take 2 doses today Take tizanidine as needed as a muscle relaxer Take your prescription for Percocet as needed for severe pain.  You may end up taking more Percocet this month than usual, please call your pain doctor to let them know you were seen in the urgent care center so you will be authorized for early refill Call your doctor if not better by Monday
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
## 2019-03-25 NOTE — ED Triage Notes (Signed)
Pt presents with left arm & shoulder pain not associated with any particuliar recent injury.

## 2019-03-28 ENCOUNTER — Telehealth: Payer: Self-pay

## 2019-03-28 NOTE — Telephone Encounter (Signed)
Overdue for an appt. LVM requesting returned call. 

## 2019-04-13 ENCOUNTER — Observation Stay (HOSPITAL_COMMUNITY): Payer: Medicare Other

## 2019-04-13 ENCOUNTER — Observation Stay (HOSPITAL_COMMUNITY)
Admission: EM | Admit: 2019-04-13 | Discharge: 2019-04-14 | Disposition: A | Discharge status: Home Health | Payer: Medicare Other | Attending: Internal Medicine | Admitting: Internal Medicine

## 2019-04-13 ENCOUNTER — Encounter (HOSPITAL_COMMUNITY): Payer: Self-pay | Admitting: Internal Medicine

## 2019-04-13 ENCOUNTER — Emergency Department (HOSPITAL_COMMUNITY): Payer: Medicare Other

## 2019-04-13 ENCOUNTER — Other Ambulatory Visit: Payer: Self-pay

## 2019-04-13 DIAGNOSIS — Z833 Family history of diabetes mellitus: Secondary | ICD-10-CM | POA: Insufficient documentation

## 2019-04-13 DIAGNOSIS — K219 Gastro-esophageal reflux disease without esophagitis: Secondary | ICD-10-CM | POA: Diagnosis not present

## 2019-04-13 DIAGNOSIS — I129 Hypertensive chronic kidney disease with stage 1 through stage 4 chronic kidney disease, or unspecified chronic kidney disease: Secondary | ICD-10-CM | POA: Insufficient documentation

## 2019-04-13 DIAGNOSIS — E785 Hyperlipidemia, unspecified: Secondary | ICD-10-CM | POA: Diagnosis present

## 2019-04-13 DIAGNOSIS — Z955 Presence of coronary angioplasty implant and graft: Secondary | ICD-10-CM | POA: Diagnosis not present

## 2019-04-13 DIAGNOSIS — E1040 Type 1 diabetes mellitus with diabetic neuropathy, unspecified: Secondary | ICD-10-CM | POA: Diagnosis not present

## 2019-04-13 DIAGNOSIS — I252 Old myocardial infarction: Secondary | ICD-10-CM | POA: Insufficient documentation

## 2019-04-13 DIAGNOSIS — W19XXXA Unspecified fall, initial encounter: Secondary | ICD-10-CM

## 2019-04-13 DIAGNOSIS — M879 Osteonecrosis, unspecified: Secondary | ICD-10-CM | POA: Diagnosis not present

## 2019-04-13 DIAGNOSIS — R109 Unspecified abdominal pain: Secondary | ICD-10-CM | POA: Diagnosis not present

## 2019-04-13 DIAGNOSIS — Z95820 Peripheral vascular angioplasty status with implants and grafts: Secondary | ICD-10-CM | POA: Insufficient documentation

## 2019-04-13 DIAGNOSIS — Z1159 Encounter for screening for other viral diseases: Secondary | ICD-10-CM | POA: Diagnosis not present

## 2019-04-13 DIAGNOSIS — I2489 Other forms of acute ischemic heart disease: Secondary | ICD-10-CM

## 2019-04-13 DIAGNOSIS — Z7952 Long term (current) use of systemic steroids: Secondary | ICD-10-CM | POA: Insufficient documentation

## 2019-04-13 DIAGNOSIS — E1051 Type 1 diabetes mellitus with diabetic peripheral angiopathy without gangrene: Secondary | ICD-10-CM | POA: Insufficient documentation

## 2019-04-13 DIAGNOSIS — E1042 Type 1 diabetes mellitus with diabetic polyneuropathy: Secondary | ICD-10-CM | POA: Diagnosis not present

## 2019-04-13 DIAGNOSIS — G8929 Other chronic pain: Secondary | ICD-10-CM | POA: Diagnosis not present

## 2019-04-13 DIAGNOSIS — R112 Nausea with vomiting, unspecified: Secondary | ICD-10-CM | POA: Diagnosis present

## 2019-04-13 DIAGNOSIS — Z7901 Long term (current) use of anticoagulants: Secondary | ICD-10-CM | POA: Insufficient documentation

## 2019-04-13 DIAGNOSIS — Z841 Family history of disorders of kidney and ureter: Secondary | ICD-10-CM | POA: Insufficient documentation

## 2019-04-13 DIAGNOSIS — I248 Other forms of acute ischemic heart disease: Secondary | ICD-10-CM | POA: Insufficient documentation

## 2019-04-13 DIAGNOSIS — Z794 Long term (current) use of insulin: Secondary | ICD-10-CM | POA: Insufficient documentation

## 2019-04-13 DIAGNOSIS — F419 Anxiety disorder, unspecified: Secondary | ICD-10-CM | POA: Insufficient documentation

## 2019-04-13 DIAGNOSIS — Z86718 Personal history of other venous thrombosis and embolism: Secondary | ICD-10-CM

## 2019-04-13 DIAGNOSIS — M545 Low back pain: Secondary | ICD-10-CM | POA: Insufficient documentation

## 2019-04-13 DIAGNOSIS — R739 Hyperglycemia, unspecified: Secondary | ICD-10-CM | POA: Diagnosis present

## 2019-04-13 DIAGNOSIS — R296 Repeated falls: Secondary | ICD-10-CM

## 2019-04-13 DIAGNOSIS — I160 Hypertensive urgency: Secondary | ICD-10-CM | POA: Insufficient documentation

## 2019-04-13 DIAGNOSIS — E1110 Type 2 diabetes mellitus with ketoacidosis without coma: Secondary | ICD-10-CM | POA: Diagnosis present

## 2019-04-13 DIAGNOSIS — D72829 Elevated white blood cell count, unspecified: Secondary | ICD-10-CM

## 2019-04-13 DIAGNOSIS — Z8249 Family history of ischemic heart disease and other diseases of the circulatory system: Secondary | ICD-10-CM | POA: Insufficient documentation

## 2019-04-13 DIAGNOSIS — E876 Hypokalemia: Secondary | ICD-10-CM

## 2019-04-13 DIAGNOSIS — R197 Diarrhea, unspecified: Secondary | ICD-10-CM | POA: Insufficient documentation

## 2019-04-13 DIAGNOSIS — Z7982 Long term (current) use of aspirin: Secondary | ICD-10-CM | POA: Insufficient documentation

## 2019-04-13 DIAGNOSIS — G629 Polyneuropathy, unspecified: Secondary | ICD-10-CM

## 2019-04-13 DIAGNOSIS — R338 Other retention of urine: Secondary | ICD-10-CM

## 2019-04-13 DIAGNOSIS — Z87891 Personal history of nicotine dependence: Secondary | ICD-10-CM | POA: Insufficient documentation

## 2019-04-13 DIAGNOSIS — M87022 Idiopathic aseptic necrosis of left humerus: Secondary | ICD-10-CM

## 2019-04-13 DIAGNOSIS — N183 Chronic kidney disease, stage 3 unspecified: Secondary | ICD-10-CM | POA: Diagnosis present

## 2019-04-13 DIAGNOSIS — E1010 Type 1 diabetes mellitus with ketoacidosis without coma: Principal | ICD-10-CM | POA: Insufficient documentation

## 2019-04-13 DIAGNOSIS — Z791 Long term (current) use of non-steroidal anti-inflammatories (NSAID): Secondary | ICD-10-CM | POA: Insufficient documentation

## 2019-04-13 DIAGNOSIS — I10 Essential (primary) hypertension: Secondary | ICD-10-CM | POA: Diagnosis present

## 2019-04-13 DIAGNOSIS — Z79899 Other long term (current) drug therapy: Secondary | ICD-10-CM | POA: Insufficient documentation

## 2019-04-13 DIAGNOSIS — I2510 Atherosclerotic heart disease of native coronary artery without angina pectoris: Secondary | ICD-10-CM | POA: Diagnosis present

## 2019-04-13 DIAGNOSIS — E1022 Type 1 diabetes mellitus with diabetic chronic kidney disease: Secondary | ICD-10-CM | POA: Diagnosis not present

## 2019-04-13 DIAGNOSIS — F329 Major depressive disorder, single episode, unspecified: Secondary | ICD-10-CM | POA: Insufficient documentation

## 2019-04-13 LAB — BASIC METABOLIC PANEL
Anion gap: 14 (ref 5–15)
Anion gap: 8 (ref 5–15)
Anion gap: 8 (ref 5–15)
Anion gap: 8 (ref 5–15)
Anion gap: 7 (ref 5–15)
BUN: 25 mg/dL — ABNORMAL HIGH (ref 8–23)
BUN: 19 mg/dL (ref 8–23)
BUN: 18 mg/dL (ref 8–23)
BUN: 18 mg/dL (ref 8–23)
BUN: 18 mg/dL (ref 8–23)
CO2: 17 mmol/L — ABNORMAL LOW (ref 22–32)
CO2: 20 mmol/L — ABNORMAL LOW (ref 22–32)
CO2: 19 mmol/L — ABNORMAL LOW (ref 22–32)
CO2: 18 mmol/L — ABNORMAL LOW (ref 22–32)
CO2: 19 mmol/L — ABNORMAL LOW (ref 22–32)
Calcium: 8.4 mg/dL — ABNORMAL LOW (ref 8.9–10.3)
Calcium: 8.4 mg/dL — ABNORMAL LOW (ref 8.9–10.3)
Calcium: 8.6 mg/dL — ABNORMAL LOW (ref 8.9–10.3)
Calcium: 8.3 mg/dL — ABNORMAL LOW (ref 8.9–10.3)
Calcium: 8.3 mg/dL — ABNORMAL LOW (ref 8.9–10.3)
Chloride: 110 mmol/L (ref 98–111)
Chloride: 115 mmol/L — ABNORMAL HIGH (ref 98–111)
Chloride: 113 mmol/L — ABNORMAL HIGH (ref 98–111)
Chloride: 109 mmol/L (ref 98–111)
Chloride: 109 mmol/L (ref 98–111)
Creatinine, Ser: 1.13 mg/dL — ABNORMAL HIGH (ref 0.44–1.00)
Creatinine, Ser: 1.05 mg/dL — ABNORMAL HIGH (ref 0.44–1.00)
Creatinine, Ser: 1.09 mg/dL — ABNORMAL HIGH (ref 0.44–1.00)
Creatinine, Ser: 1.29 mg/dL — ABNORMAL HIGH (ref 0.44–1.00)
Creatinine, Ser: 1.55 mg/dL — ABNORMAL HIGH (ref 0.44–1.00)
GFR calc Af Amer: >60 mL/min (ref >60)
GFR calc Af Amer: >60 mL/min (ref >60)
GFR calc Af Amer: >60 mL/min (ref >60)
GFR calc Af Amer: 51 mL/min — ABNORMAL LOW (ref >60)
GFR calc Af Amer: 41 mL/min — ABNORMAL LOW (ref >60)
GFR calc non Af Amer: 52 mL/min — ABNORMAL LOW (ref >60)
GFR calc non Af Amer: 57 mL/min — ABNORMAL LOW (ref >60)
GFR calc non Af Amer: 54 mL/min — ABNORMAL LOW (ref >60)
GFR calc non Af Amer: 44 mL/min — ABNORMAL LOW (ref >60)
GFR calc non Af Amer: 36 mL/min — ABNORMAL LOW (ref >60)
Glucose, Bld: 388 mg/dL — ABNORMAL HIGH (ref 70–99)
Glucose, Bld: 167 mg/dL — ABNORMAL HIGH (ref 70–99)
Glucose, Bld: 160 mg/dL — ABNORMAL HIGH (ref 70–99)
Glucose, Bld: 472 mg/dL — ABNORMAL HIGH (ref 70–99)
Glucose, Bld: 431 mg/dL — ABNORMAL HIGH (ref 70–99)
Potassium: 3.3 mmol/L — ABNORMAL LOW (ref 3.5–5.1)
Potassium: 3.4 mmol/L — ABNORMAL LOW (ref 3.5–5.1)
Potassium: 3.8 mmol/L (ref 3.5–5.1)
Potassium: 4.1 mmol/L (ref 3.5–5.1)
Potassium: 4.2 mmol/L (ref 3.5–5.1)
Sodium: 141 mmol/L (ref 135–145)
Sodium: 143 mmol/L (ref 135–145)
Sodium: 140 mmol/L (ref 135–145)
Sodium: 135 mmol/L (ref 135–145)
Sodium: 135 mmol/L (ref 135–145)

## 2019-04-13 LAB — CBC WITH DIFFERENTIAL/PLATELET
Abs Immature Granulocytes: 0.05 10*3/uL (ref 0.00–0.07)
Basophils Absolute: 0 10*3/uL (ref 0.0–0.1)
Basophils Relative: 0 %
Eosinophils Absolute: 0 10*3/uL (ref 0.0–0.5)
Eosinophils Relative: 0 %
HCT: 49.2 % — ABNORMAL HIGH (ref 36.0–46.0)
Hemoglobin: 14.8 g/dL (ref 12.0–15.0)
Immature Granulocytes: 0 %
Lymphocytes Relative: 15 %
Lymphs Abs: 2 10*3/uL (ref 0.7–4.0)
MCH: 21.4 pg — ABNORMAL LOW (ref 26.0–34.0)
MCHC: 30.1 g/dL (ref 30.0–36.0)
MCV: 71 fL — ABNORMAL LOW (ref 80.0–100.0)
Monocytes Absolute: 0.6 10*3/uL (ref 0.1–1.0)
Monocytes Relative: 5 %
Neutro Abs: 10.8 10*3/uL — ABNORMAL HIGH (ref 1.7–7.7)
Neutrophils Relative %: 80 %
Platelets: 351 10*3/uL (ref 150–400)
RBC: 6.93 MIL/uL — ABNORMAL HIGH (ref 3.87–5.11)
RDW: 20.5 % — ABNORMAL HIGH (ref 11.5–15.5)
WBC: 13.5 10*3/uL — ABNORMAL HIGH (ref 4.0–10.5)
nRBC: 0 % (ref 0.0–0.2)

## 2019-04-13 LAB — URINALYSIS, ROUTINE W REFLEX MICROSCOPIC
Bilirubin Urine: NEGATIVE
Glucose, UA: >=500 mg/dL — AB
Ketones, ur: 15 mg/dL — AB
Leukocytes,Ua: NEGATIVE
Nitrite: NEGATIVE
Protein, ur: 100 mg/dL — AB
Specific Gravity, Urine: 1.015 (ref 1.005–1.030)
pH: 6.5 (ref 5.0–8.0)

## 2019-04-13 LAB — COMPREHENSIVE METABOLIC PANEL
ALT: 71 U/L — ABNORMAL HIGH (ref 0–44)
AST: 68 U/L — ABNORMAL HIGH (ref 15–41)
Albumin: 4.1 g/dL (ref 3.5–5.0)
Alkaline Phosphatase: 159 U/L — ABNORMAL HIGH (ref 38–126)
Anion gap: 20 — ABNORMAL HIGH (ref 5–15)
BUN: 30 mg/dL — ABNORMAL HIGH (ref 8–23)
CO2: 16 mmol/L — ABNORMAL LOW (ref 22–32)
Calcium: 9.4 mg/dL (ref 8.9–10.3)
Chloride: 101 mmol/L (ref 98–111)
Creatinine, Ser: 1.35 mg/dL — ABNORMAL HIGH (ref 0.44–1.00)
GFR calc Af Amer: 49 mL/min — ABNORMAL LOW (ref >60)
GFR calc non Af Amer: 42 mL/min — ABNORMAL LOW (ref >60)
Glucose, Bld: 545 mg/dL (ref 70–99)
Potassium: 3.9 mmol/L (ref 3.5–5.1)
Sodium: 137 mmol/L (ref 135–145)
Total Bilirubin: 0.6 mg/dL (ref 0.3–1.2)
Total Protein: 8.5 g/dL — ABNORMAL HIGH (ref 6.5–8.1)

## 2019-04-13 LAB — URINALYSIS, MICROSCOPIC (REFLEX): Squamous Epithelial / LPF: NONE SEEN (ref 0–5)

## 2019-04-13 LAB — TROPONIN I
Troponin I: 0.06 ng/mL (ref <0.03)
Troponin I: 0.08 ng/mL (ref <0.03)
Troponin I: 0.06 ng/mL (ref <0.03)

## 2019-04-13 LAB — HEPATIC FUNCTION PANEL
ALT: 47 U/L — ABNORMAL HIGH (ref 0–44)
AST: 32 U/L (ref 15–41)
Albumin: 3.1 g/dL — ABNORMAL LOW (ref 3.5–5.0)
Alkaline Phosphatase: 110 U/L (ref 38–126)
Bilirubin, Direct: 0.1 mg/dL (ref 0.0–0.2)
Indirect Bilirubin: 0.2 mg/dL — ABNORMAL LOW (ref 0.3–0.9)
Total Bilirubin: 0.3 mg/dL (ref 0.3–1.2)
Total Protein: 6.3 g/dL — ABNORMAL LOW (ref 6.5–8.1)

## 2019-04-13 LAB — GLUCOSE, CAPILLARY
Glucose-Capillary: 341 mg/dL — ABNORMAL HIGH (ref 70–99)
Glucose-Capillary: 350 mg/dL — ABNORMAL HIGH (ref 70–99)
Glucose-Capillary: 337 mg/dL — ABNORMAL HIGH (ref 70–99)
Glucose-Capillary: 187 mg/dL — ABNORMAL HIGH (ref 70–99)
Glucose-Capillary: 196 mg/dL — ABNORMAL HIGH (ref 70–99)
Glucose-Capillary: 166 mg/dL — ABNORMAL HIGH (ref 70–99)
Glucose-Capillary: 158 mg/dL — ABNORMAL HIGH (ref 70–99)
Glucose-Capillary: 244 mg/dL — ABNORMAL HIGH (ref 70–99)
Glucose-Capillary: 198 mg/dL — ABNORMAL HIGH (ref 70–99)
Glucose-Capillary: 171 mg/dL — ABNORMAL HIGH (ref 70–99)
Glucose-Capillary: 500 mg/dL — ABNORMAL HIGH (ref 70–99)

## 2019-04-13 LAB — SARS CORONAVIRUS 2 BY RT PCR (HOSPITAL ORDER, PERFORMED IN ~~LOC~~ HOSPITAL LAB): SARS Coronavirus 2: NEGATIVE

## 2019-04-13 LAB — CBG MONITORING, ED
Glucose-Capillary: 595 mg/dL (ref 70–99)
Glucose-Capillary: 343 mg/dL — ABNORMAL HIGH (ref 70–99)

## 2019-04-13 LAB — MRSA PCR SCREENING: MRSA by PCR: NEGATIVE

## 2019-04-13 LAB — MAGNESIUM: Magnesium: 1.8 mg/dL (ref 1.7–2.4)

## 2019-04-13 LAB — BETA-HYDROXYBUTYRIC ACID: Beta-Hydroxybutyric Acid: 1.53 mmol/L — ABNORMAL HIGH (ref 0.05–0.27)

## 2019-04-13 LAB — LIPASE, BLOOD
Lipase: 56 U/L — ABNORMAL HIGH (ref 11–51)
Lipase: 39 U/L (ref 11–51)

## 2019-04-13 MED ORDER — PANTOPRAZOLE SODIUM 40 MG PO TBEC
40.0000 mg | DELAYED_RELEASE_TABLET | Freq: Two times a day (BID) | ORAL | Status: DC
  Administered 2019-04-13 – 2019-04-14 (×3): 40 mg via ORAL
  Filled 2019-04-13 (×3): qty 1

## 2019-04-13 MED ORDER — CLONIDINE HCL 0.1 MG PO TABS
0.1000 mg | ORAL_TABLET | Freq: Two times a day (BID) | ORAL | Status: DC
  Administered 2019-04-13 – 2019-04-14 (×2): 0.1 mg via ORAL
  Filled 2019-04-13 (×2): qty 1

## 2019-04-13 MED ORDER — CHLORHEXIDINE GLUCONATE CLOTH 2 % EX PADS: 6.0000 | MEDICATED_PAD | Freq: Every day | CUTANEOUS | Status: DC

## 2019-04-13 MED ORDER — ONDANSETRON HCL 4 MG/2ML IJ SOLN
4.0000 mg | Freq: Once | INTRAMUSCULAR | Status: AC
  Administered 2019-04-13: 4 mg via INTRAVENOUS
  Filled 2019-04-13: qty 2

## 2019-04-13 MED ORDER — ASPIRIN 81 MG PO CHEW
81.0000 mg | CHEWABLE_TABLET | Freq: Every day | ORAL | Status: DC
  Administered 2019-04-13 – 2019-04-14 (×2): 81 mg via ORAL
  Filled 2019-04-13 (×2): qty 1

## 2019-04-13 MED ORDER — AMLODIPINE BESYLATE 10 MG PO TABS
10.0000 mg | ORAL_TABLET | Freq: Every day | ORAL | Status: DC
  Administered 2019-04-13: 10 mg via ORAL
  Filled 2019-04-13: qty 1

## 2019-04-13 MED ORDER — ROSUVASTATIN CALCIUM 20 MG PO TABS
40.0000 mg | ORAL_TABLET | Freq: Every day | ORAL | Status: DC
  Administered 2019-04-13: 22:00:00 40 mg via ORAL
  Filled 2019-04-13: qty 2

## 2019-04-13 MED ORDER — LISINOPRIL 20 MG PO TABS
20.0000 mg | ORAL_TABLET | Freq: Every day | ORAL | Status: DC
  Administered 2019-04-13 – 2019-04-14 (×2): 20 mg via ORAL
  Filled 2019-04-13: qty 1
  Filled 2019-04-13: qty 2

## 2019-04-13 MED ORDER — DEXTROSE-NACL 5-0.45 % IV SOLN
INTRAVENOUS | Status: DC
  Administered 2019-04-13: 09:00:00 via INTRAVENOUS

## 2019-04-13 MED ORDER — EZETIMIBE 10 MG PO TABS
10.0000 mg | ORAL_TABLET | Freq: Every day | ORAL | Status: DC
  Administered 2019-04-13 – 2019-04-14 (×2): 10 mg via ORAL
  Filled 2019-04-13 (×2): qty 1

## 2019-04-13 MED ORDER — ORAL CARE MOUTH RINSE
15.0000 mL | Freq: Two times a day (BID) | OROMUCOSAL | Status: DC
  Administered 2019-04-13 (×2): 15 mL via OROMUCOSAL

## 2019-04-13 MED ORDER — ALPRAZOLAM 0.5 MG PO TABS
0.5000 mg | ORAL_TABLET | Freq: Every day | ORAL | Status: DC
  Administered 2019-04-13: 0.5 mg via ORAL
  Filled 2019-04-13: qty 1

## 2019-04-13 MED ORDER — NITROGLYCERIN 0.4 MG SL SUBL: 0.4000 mg | SUBLINGUAL_TABLET | SUBLINGUAL | Status: DC | PRN

## 2019-04-13 MED ORDER — SODIUM CHLORIDE 0.9 % IV SOLN
INTRAVENOUS | Status: DC
  Administered 2019-04-13 – 2019-04-14 (×2): via INTRAVENOUS

## 2019-04-13 MED ORDER — DEXTROSE-NACL 5-0.45 % IV SOLN: INTRAVENOUS | Status: DC

## 2019-04-13 MED ORDER — INSULIN ASPART 100 UNIT/ML ~~LOC~~ SOLN
0.0000 [IU] | Freq: Three times a day (TID) | SUBCUTANEOUS | Status: DC
  Administered 2019-04-13: 3 [IU] via SUBCUTANEOUS
  Administered 2019-04-14: 8 [IU] via SUBCUTANEOUS
  Administered 2019-04-14: 11 [IU] via SUBCUTANEOUS

## 2019-04-13 MED ORDER — CLONIDINE HCL 0.1 MG PO TABS
0.2000 mg | ORAL_TABLET | Freq: Two times a day (BID) | ORAL | Status: DC
  Administered 2019-04-13: 0.2 mg via ORAL
  Filled 2019-04-13: qty 2

## 2019-04-13 MED ORDER — INSULIN ASPART 100 UNIT/ML ~~LOC~~ SOLN
18.0000 [IU] | Freq: Once | SUBCUTANEOUS | Status: AC
  Administered 2019-04-13: 18 [IU] via SUBCUTANEOUS

## 2019-04-13 MED ORDER — POTASSIUM CHLORIDE 10 MEQ/100ML IV SOLN
10.0000 meq | INTRAVENOUS | Status: AC
  Administered 2019-04-13 (×4): 10 meq via INTRAVENOUS
  Filled 2019-04-13 (×4): qty 100

## 2019-04-13 MED ORDER — ISOSORBIDE MONONITRATE ER 60 MG PO TB24
90.0000 mg | ORAL_TABLET | Freq: Every day | ORAL | Status: DC
  Administered 2019-04-13 – 2019-04-14 (×2): 90 mg via ORAL
  Filled 2019-04-13: qty 2
  Filled 2019-04-13: qty 1

## 2019-04-13 MED ORDER — FERROUS SULFATE 325 (65 FE) MG PO TABS
325.0000 mg | ORAL_TABLET | Freq: Two times a day (BID) | ORAL | Status: DC
  Administered 2019-04-13 – 2019-04-14 (×3): 325 mg via ORAL
  Filled 2019-04-13 (×4): qty 1

## 2019-04-13 MED ORDER — RIVAROXABAN 20 MG PO TABS
20.0000 mg | ORAL_TABLET | Freq: Every day | ORAL | Status: DC
  Administered 2019-04-13: 20 mg via ORAL
  Filled 2019-04-13: qty 1

## 2019-04-13 MED ORDER — INSULIN ASPART 100 UNIT/ML ~~LOC~~ SOLN: 0.0000 [IU] | Freq: Every day | SUBCUTANEOUS | Status: DC

## 2019-04-13 MED ORDER — IOHEXOL 300 MG/ML  SOLN: 30.0000 mL | Freq: Once | INTRAMUSCULAR | Status: DC | PRN

## 2019-04-13 MED ORDER — INSULIN REGULAR(HUMAN) IN NACL 100-0.9 UT/100ML-% IV SOLN
INTRAVENOUS | Status: DC
  Administered 2019-04-13: 5.4 [IU]/h via INTRAVENOUS
  Filled 2019-04-13: qty 100

## 2019-04-13 MED ORDER — INSULIN GLARGINE 100 UNIT/ML ~~LOC~~ SOLN
68.0000 [IU] | Freq: Every day | SUBCUTANEOUS | Status: DC
  Administered 2019-04-14: 68 [IU] via SUBCUTANEOUS
  Filled 2019-04-13: qty 0.68

## 2019-04-13 MED ORDER — OXYCODONE-ACETAMINOPHEN 5-325 MG PO TABS
1.0000 | ORAL_TABLET | Freq: Three times a day (TID) | ORAL | Status: DC | PRN
  Administered 2019-04-13: 1 via ORAL
  Filled 2019-04-13: qty 1

## 2019-04-13 MED ORDER — ALBUTEROL SULFATE (2.5 MG/3ML) 0.083% IN NEBU: 2.5000 mg | INHALATION_SOLUTION | RESPIRATORY_TRACT | Status: DC | PRN

## 2019-04-13 MED ORDER — TIZANIDINE HCL 4 MG PO TABS
4.0000 mg | ORAL_TABLET | Freq: Four times a day (QID) | ORAL | Status: DC | PRN
  Administered 2019-04-13: 22:00:00 4 mg via ORAL
  Administered 2019-04-13 – 2019-04-14 (×2): 8 mg via ORAL
  Filled 2019-04-13 (×2): qty 2
  Filled 2019-04-13: qty 1

## 2019-04-13 MED ORDER — HYDRALAZINE HCL 20 MG/ML IJ SOLN
10.0000 mg | Freq: Once | INTRAMUSCULAR | Status: AC
  Administered 2019-04-13: 06:00:00 10 mg via INTRAVENOUS
  Filled 2019-04-13: qty 1

## 2019-04-13 MED ORDER — GABAPENTIN 300 MG PO CAPS
300.0000 mg | ORAL_CAPSULE | Freq: Three times a day (TID) | ORAL | Status: DC
  Administered 2019-04-13 – 2019-04-14 (×4): 300 mg via ORAL
  Filled 2019-04-13 (×4): qty 1

## 2019-04-13 MED ORDER — OXYCODONE HCL 5 MG PO TABS
5.0000 mg | ORAL_TABLET | Freq: Three times a day (TID) | ORAL | Status: DC | PRN
  Administered 2019-04-13 – 2019-04-14 (×3): 5 mg via ORAL
  Filled 2019-04-13 (×3): qty 1

## 2019-04-13 MED ORDER — OXYCODONE-ACETAMINOPHEN 10-325 MG PO TABS: 1.0000 | ORAL_TABLET | Freq: Three times a day (TID) | ORAL | Status: DC

## 2019-04-13 MED ORDER — CLONIDINE HCL 0.1 MG PO TABS: 0.2000 mg | ORAL_TABLET | Freq: Two times a day (BID) | ORAL | Status: DC

## 2019-04-13 MED ORDER — INSULIN GLARGINE 100 UNIT/ML ~~LOC~~ SOLN
80.0000 [IU] | Freq: Every day | SUBCUTANEOUS | Status: DC
  Administered 2019-04-13: 13:00:00 80 [IU] via SUBCUTANEOUS
  Filled 2019-04-13: qty 0.8

## 2019-04-13 MED ORDER — INSULIN REGULAR(HUMAN) IN NACL 100-0.9 UT/100ML-% IV SOLN
INTRAVENOUS | Status: DC
  Administered 2019-04-13: 2.8 [IU]/h via INTRAVENOUS
  Filled 2019-04-13: qty 100

## 2019-04-13 MED ORDER — DICYCLOMINE HCL 10 MG/ML IM SOLN
20.0000 mg | Freq: Once | INTRAMUSCULAR | Status: AC
  Administered 2019-04-13: 20 mg via INTRAMUSCULAR
  Filled 2019-04-13: qty 2

## 2019-04-13 MED ORDER — SODIUM CHLORIDE 0.9 % IV BOLUS
2000.0000 mL | Freq: Once | INTRAVENOUS | Status: AC
  Administered 2019-04-13: 2000 mL via INTRAVENOUS

## 2019-04-13 MED ORDER — LABETALOL HCL 5 MG/ML IV SOLN
10.0000 mg | INTRAVENOUS | Status: DC | PRN
  Filled 2019-04-13: qty 4

## 2019-04-13 MED ORDER — POTASSIUM CHLORIDE CRYS ER 20 MEQ PO TBCR
40.0000 meq | EXTENDED_RELEASE_TABLET | Freq: Once | ORAL | Status: AC
  Administered 2019-04-13: 13:00:00 40 meq via ORAL
  Filled 2019-04-13: qty 2

## 2019-04-13 MED ORDER — ALBUTEROL SULFATE HFA 108 (90 BASE) MCG/ACT IN AERS: 2.0000 | INHALATION_SPRAY | RESPIRATORY_TRACT | Status: DC | PRN

## 2019-04-13 MED ORDER — LIVING WELL WITH DIABETES BOOK
Freq: Once | Status: AC
  Administered 2019-04-13: 14:00:00
  Filled 2019-04-13: qty 1

## 2019-04-13 NOTE — Progress Notes (Signed)
  PROGRESS NOTE  Patient admitted earlier this morning. See H&P. Alejandra Hernandez is a 63 y.o. female with history of diabetes mellitus type 2, chronic kidney disease stage III, peripheral vascular disease, DVT on Xarelto, CAD status post stenting presents to the ER because of persistent nausea, vomiting, diarrhea last 3 days.  In the emergency department, CT head unremarkable, abdominal x-ray negative.  Patient was found to be in DKA, started on IV insulin and admitted to stepdown unit.  Patient very lethargic on examination but arousable with repeated verbal stimuli.  Planes of sore throat and a knot in the back of her neck.  She remains in normal sinus rhythm with soft abdomen.  Blood pressure much better controlled.  Continue IV insulin for DKA, trend BMP Continue IV fluids Trend troponin, mildly elevated 0.06 this morning Replace potassium CT abdomen pelvis pending  Dessa Phi, DO [facility] [email or web address] 04/13/2019, 9:13 AM

## 2019-04-13 NOTE — ED Notes (Signed)
Pt placed on purewick and has made multiple attempts to void. However, pt states she feels like she needs to urinate but cannot and it hurts.
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
## 2019-04-13 NOTE — ED Notes (Signed)
Date and time results received: 04/13/19 0422   Test: glucose Critical Value: 545  Name of Provider Notified: [place], MD

## 2019-04-13 NOTE — ED Notes (Signed)
Pt transported to CT ?

## 2019-04-13 NOTE — ED Triage Notes (Addendum)
Pt from home brought to ED by [facility] with hyperglycemia x2-3 weeks, abd pain N/V/D over last 3 days also reports falls. Pt tested  Last Friday for COVID -19 was negative

## 2019-04-13 NOTE — Progress Notes (Signed)
Received pt from ICU/SD, pt alert and oriented, pleasant. Tele placed, denies pain, dinner at bedside. Skin unremarkable, WNL. Purewick placed due to void pafter foley d/c'd at 5:30pm. SRP,RN
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
## 2019-04-13 NOTE — ED Notes (Addendum)
Bladder scan performed and was noted to be >648mL. Dr. Florina Ou made aware.

## 2019-04-13 NOTE — Progress Notes (Addendum)
CRITICAL VALUE ALERT  Critical Value:  Troponin 0.06  Date & Time Notied:  04/13/19 0707  Provider Notified: Maylene Roes  Orders Received/Actions taken: no new orders at this time

## 2019-04-13 NOTE — Progress Notes (Signed)
Upon arrival to ICU, the patient was on the non-DKA order set. This nurse stopped the non-DKA order set and started the DKA protocol for the order that was put in at 0449 . There was also orders in to insert a foley catheter at 0332 and 0449 and the patient arrived to ICU at 0545 with no foley. Will continue to monitor.

## 2019-04-13 NOTE — Progress Notes (Addendum)
Inpatient Diabetes Program Recommendations  AACE/ADA: New Consensus Statement on Inpatient Glycemic Control (2015)  Target Ranges:  Prepandial:   less than 140 mg/dL      Peak postprandial:   less than 180 mg/dL (1-2 hours)      Critically ill patients:  140 - 180 mg/dL   Lab Results  Component Value Date   GLUCAP 343 (H) 04/13/2019   HGBA1C 13.3 (A) 12/30/2018    Review of Glycemic Control  Diabetes history: DM2 (Reviewed with patient & note from [facility] states type 2) Outpatient Diabetes medications: Tresiba 68 units daily + Humalog 4-5 units ac meals Current orders for Inpatient glycemic control: IV insulin  Inpatient Diabetes Program Recommendations:   Patient was seen by DM coordinator last on 01/17/19. Patient was recommended to followup with endocrinologist Dr. Loanne Drilling regarding adding back to her regimen short acting insulin to assist with meal coverage. No new notes from Dr. Loanne Drilling note since 12/30/18. Will plan to speak with patient regarding sick day rules and need to take portion of basal insulin even though unable to take po.  Noted CO2 on last labs 17 & anion gap closed @ 14. When ready to transition off of IV insulin drip, will need large portion of basal 2 hrs prior to IV insulin discontinued and cover with Novolog correction @ time of discontinuation of IV insulin drip.  1:00 pm Spoke with patient @ bedside and patient states she currently see FNP Alejandra Hernandez with [facility] for diabetes management. Last visit was 03/30/19 and patient was prescribed to take 65 units [place] daily and now she takes 68 units daily. Patient was also prescribed correction scale in addition to covering meals but patient has fear of hypoglycemia. Reviewed with patient difference in short acting insulin for correction and meal coverage. Patient has been taking Humalog 4-5 units pc meals. Reviewed with patient to take meal coverage ac meals unless ordered other wise and  the reasoning why it helped stablelize blood glucose. Patient acknowledged understanding.   Thank you, Alejandra Hernandez. Alejandra Beltran, RN, MSN, CDE  Diabetes Coordinator Inpatient Glycemic Control Team Team Pager [phone] (8am-5pm) 04/13/2019 10:41 AM

## 2019-04-13 NOTE — ED Notes (Signed)
ED TO INPATIENT HANDOFF REPORT  Name/Age/Gender Alejandra Hernandez 63 y.o. female  Code Status    Code Status Orders  (From admission, onward)         Start     Ordered   04/13/19 0448  Full code  Continuous     04/13/19 0449        Code Status History    Date Active Date Inactive Code Status Order ID Comments User Context   01/16/2019 2007 01/17/2019 2133 Full Code [id number]  Truett Mainland, DO Inpatient   11/16/2018 1859 11/19/2018 1716 Full Code [id number]  Colbert Ewing, MD ED   10/20/2018 1336 10/21/2018 1508 Full Code [id number]  Wellington [place], MD Inpatient   05/14/2018 0730 05/17/2018 2033 Full Code [id number]  Rise Patience, MD ED   01/20/2018 1256 01/21/2018 1414 Full Code [id number]  Wellington [place], MD Inpatient   12/23/2017 0936 12/24/2017 1617 Full Code [id number]  Maryellen Pile, MD Inpatient   05/29/2017 2012 06/02/2017 1919 Partial Code [id number]  Lovenia Kim, MD Inpatient   05/18/2017 1121 05/20/2017 1545 Full Code [id number]  Jettie Booze, MD Inpatient   12/28/2016 1652 01/11/2017 1642 Full Code [id number]  Everrett Coombe, MD Inpatient   07/30/2016 1658 08/01/2016 1627 Full Code [id number]  Nelva Bush, MD Inpatient   07/23/2016 1556 07/23/2016 2223 Full Code [id number]  Nelva Bush, MD Inpatient   04/20/2016 0115 04/20/2016 1840 Full Code [id number]  Aquilla Hacker, MD Inpatient   02/11/2016 1725 02/13/2016 1649 Full Code [id number]  Mariel Aloe, MD Inpatient   04/26/2014 0042 04/26/2014 1323 Full Code [id number]  Berle Mull, MD Inpatient      Home/SNF/Other Home  Chief Complaint N/V/D  Level of Care/Admitting Diagnosis ED Disposition    ED Disposition Condition [place]: [facility] [100102]  Level of Care: Stepdown [14]  Admit to SDU based on following criteria: Severe physiological/psychological symptoms:  Any diagnosis requiring assessment & intervention at least every 4 hours on an ongoing basis to  obtain desired patient outcomes including stability and rehabilitation  Covid Evaluation: N/A  Diagnosis: DKA (diabetic ketoacidoses) [facility]) [035009]  Admitting Physician: Rise Patience [phone]  Attending Physician: Rise Patience Lei.Right  PT Class (Do Not Modify): Observation [104]  PT Acc Code (Do Not Modify): Observation [10022]       Medical History Past Medical History:  Diagnosis Date  . Anemia   . Anxiety   . Arthritis    "right knee, back, feet, hands" (10/20/2018)  . Bleeding stomach ulcer 01/2016  . CAD (coronary artery disease)    05/19/17 PCI with DESx1 to [facility]  . Carotid artery disease (HCC)    40-59% right and 1-39% left by doppler 05/2018 with stenotic right subclavian artery  . Chronic lower back pain   . CKD (chronic kidney disease) stage 3, GFR 30-59 ml/min (HCC) 01/2016  . Depression   . Diabetic peripheral neuropathy ([place])   . DVT (deep venous thrombosis) ([place]) 12/2016   BLE  . GERD (gastroesophageal reflux disease)   . GI bleed   . Gout   . Headache    "weekly" (11/27//2019)  . History of blood transfusion 2017   "related to stomach bleeding"  . Hyperlipidemia   . Hypertension   . NSTEMI (non-ST elevated myocardial infarction) ([place]) 05/2017  . PAD (peripheral artery disease) ([place])    a. LE stenting in 2017, 12/2017, 09/2018 - Dr. Fletcher Anon  . Pneumonia    "  several times" (10/20/2018)  . Renal artery stenosis (HCC)    a.  mild to moderate RAS by aortogram in 2017 (no evidence of this on duplex 06/2018).  . Stenosis of right subclavian artery ([place])   . Type II diabetes mellitus (HCC)     Allergies No Known Allergies  IV Location/Drains/Wounds Patient Lines/Drains/Airways Status   Active Line/Drains/Airways    Name:   Placement date:   Placement time:   Site:   Days:   Peripheral IV 04/13/19 Left Forearm   04/13/19    0305    Forearm   less than 1   External Urinary Catheter   04/13/19    0300    -   less than 1           Labs/Imaging Results for orders placed or performed during the hospital encounter of 04/13/19 (from the past 48 hour(s))  CBG monitoring, ED     Status: Abnormal   Collection Time: 04/13/19  2:58 AM  Result Value Ref Range   Glucose-Capillary 595 (HH) 70 - 99 mg/dL   Comment 1 Notify RN   SARS Coronavirus 2 (CEPHEID - Performed in [place] hospital lab), Hosp Order     Status: None   Collection Time: 04/13/19  3:05 AM  Result Value Ref Range   SARS Coronavirus 2 NEGATIVE NEGATIVE    Comment: (NOTE) If result is NEGATIVE SARS-CoV-2 target nucleic acids are NOT DETECTED. The SARS-CoV-2 RNA is generally detectable in upper and lower  respiratory specimens during the acute phase of infection. The lowest  concentration of SARS-CoV-2 viral copies this assay can detect is 250  copies / mL. A negative result does not preclude SARS-CoV-2 infection  and should not be used as the sole basis for treatment or other  patient management decisions.  A negative result may occur with  improper specimen collection / handling, submission of specimen other  than nasopharyngeal swab, presence of viral mutation(s) within the  areas targeted by this assay, and inadequate number of viral copies  (<250 copies / mL). A negative result must be combined with clinical  observations, patient history, and epidemiological information. If result is POSITIVE SARS-CoV-2 target nucleic acids are DETECTED. The SARS-CoV-2 RNA is generally detectable in upper and lower  respiratory specimens dur ing the acute phase of infection.  Positive  results are indicative of active infection with SARS-CoV-2.  Clinical  correlation with patient history and other diagnostic information is  necessary to determine patient infection status.  Positive results do  not rule out bacterial infection or co-infection with other viruses. If result is PRESUMPTIVE POSTIVE SARS-CoV-2 nucleic acids MAY BE PRESENT.   A presumptive  positive result was obtained on the submitted specimen  and confirmed on repeat testing.  While 2019 novel coronavirus  (SARS-CoV-2) nucleic acids may be present in the submitted sample  additional confirmatory testing may be necessary for epidemiological  and / or clinical management purposes  to differentiate between  SARS-CoV-2 and other Sarbecovirus currently known to infect humans.  If clinically indicated additional testing with an alternate test  methodology [phone]) is advised. The SARS-CoV-2 RNA is generally  detectable in upper and lower respiratory sp ecimens during the acute  phase of infection. The expected result is Negative. Fact Sheet for Patients:  StrictlyIdeas.no Fact Sheet for Healthcare Providers: BankingDealers.co.za This test is not yet approved or cleared by the [place] FDA and has been authorized for detection and/or diagnosis of SARS-CoV-2 by FDA under  an Emergency Use Authorization (EUA).  This EUA will remain in effect (meaning this test can be used) for the duration of the COVID-19 declaration under Section 564(b)(1) of the Act, 21 U.S.C. section 360bbb-3(b)(1), unless the authorization is terminated or revoked sooner. Performed at [facility], [place] [address] [place], [place] [address]   CBC with Differential/Platelet     Status: Abnormal   Collection Time: 04/13/19  3:15 AM  Result Value Ref Range   WBC 13.5 (H) 4.0 - 10.5 K/uL   RBC 6.93 (H) 3.87 - 5.11 MIL/uL   Hemoglobin 14.8 12.0 - 15.0 g/dL   HCT 49.2 (H) 36.0 - 46.0 %   MCV 71.0 (L) 80.0 - 100.0 fL   MCH 21.4 (L) 26.0 - 34.0 pg   MCHC 30.1 30.0 - 36.0 g/dL   RDW 20.5 (H) 11.5 - 15.5 %   Platelets 351 150 - 400 K/uL    Comment: REPEATED TO VERIFY PLATELET COUNT CONFIRMED BY SMEAR SPECIMEN CHECKED FOR CLOTS    nRBC 0.0 0.0 - 0.2 %   Neutrophils Relative % 80 %   Neutro Abs 10.8 (H) 1.7 - 7.7 K/uL   Lymphocytes  Relative 15 %   Lymphs Abs 2.0 0.7 - 4.0 K/uL   Monocytes Relative 5 %   Monocytes Absolute 0.6 0.1 - 1.0 K/uL   Eosinophils Relative 0 %   Eosinophils Absolute 0.0 0.0 - 0.5 K/uL   Basophils Relative 0 %   Basophils Absolute 0.0 0.0 - 0.1 K/uL   Immature Granulocytes 0 %   Abs Immature Granulocytes 0.05 0.00 - 0.07 K/uL    Comment: Performed at [facility], [place] [address], [place], [place] [address]  Urinalysis, Routine w reflex microscopic     Status: Abnormal   Collection Time: 04/13/19  3:15 AM  Result Value Ref Range   Color, Urine YELLOW YELLOW    Comment: YELLOW   APPearance CLEAR CLEAR   Specific Gravity, Urine 1.015 1.005 - 1.030   pH 6.5 5.0 - 8.0   Glucose, UA >=500 (A) NEGATIVE mg/dL   Hgb urine dipstick SMALL (A) NEGATIVE   Bilirubin Urine NEGATIVE NEGATIVE   Ketones, ur 15 (A) NEGATIVE mg/dL   Protein, ur 100 (A) NEGATIVE mg/dL   Nitrite NEGATIVE NEGATIVE   Leukocytes,Ua NEGATIVE NEGATIVE    Comment: Performed at [facility], [place] [address] [place], [place] [address]  Comprehensive metabolic panel     Status: Abnormal   Collection Time: 04/13/19  3:15 AM  Result Value Ref Range   Sodium 137 135 - 145 mmol/L   Potassium 3.9 3.5 - 5.1 mmol/L   Chloride 101 98 - 111 mmol/L   CO2 16 (L) 22 - 32 mmol/L   Glucose, Bld 545 (HH) 70 - 99 mg/dL    Comment: CRITICAL RESULT CALLED TO, READ BACK BY AND VERIFIED WITH: Geryl Councilman RN [id number] 04/13/19 A NAVARRO    BUN 30 (H) 8 - 23 mg/dL   Creatinine, Ser 1.35 (H) 0.44 - 1.00 mg/dL   Calcium 9.4 8.9 - 10.3 mg/dL   Total Protein 8.5 (H) 6.5 - 8.1 g/dL   Albumin 4.1 3.5 - 5.0 g/dL   AST 68 (H) 15 - 41 U/L   ALT 71 (H) 0 - 44 U/L   Alkaline Phosphatase 159 (H) 38 - 126 U/L   Total Bilirubin 0.6 0.3 - 1.2 mg/dL   GFR calc non Af Amer 42 (L) >60 mL/min   GFR  calc Af Amer 49 (L) >60 mL/min   Anion gap 20 (H) 5 - 15    Comment: Performed at [facility], [place] [address], [place], [place] [address]  Lipase, blood     Status: Abnormal   Collection Time: 04/13/19  3:15 AM  Result Value Ref Range   Lipase 56 (H) 11 - 51 U/L    Comment: Performed at [facility], [place] [address], [place], [place] [address]  Urinalysis, Microscopic (reflex)     Status: Abnormal   Collection Time: 04/13/19  3:15 AM  Result Value Ref Range   RBC / HPF 0-5 0 - 5 RBC/hpf   WBC, UA 0-5 0 - 5 WBC/hpf   Bacteria, UA RARE (A) NONE SEEN   Squamous Epithelial / LPF NONE SEEN 0 - 5   Mucus PRESENT     Comment: Performed at [facility], [place] [address], [place], [place] [address]  CBG monitoring, ED     Status: Abnormal   Collection Time: 04/13/19  4:32 AM  Result Value Ref Range   Glucose-Capillary 343 (H) 70 - 99 mg/dL   No results found.  Pending Labs Unresulted Labs (From admission, onward)    Start     Ordered   04/13/19 0449  Troponin I - Now Then Q6H  Now then every 6 hours,   R     04/13/19 0449   04/13/19 7026  Basic metabolic panel  STAT Now then every 4 hours ,   STAT     04/13/19 0449   04/13/19 0448  Magnesium  Once,   R     04/13/19 0449   04/13/19 0448  Beta-hydroxybutyric acid  Once,   R     04/13/19 0449   04/13/19 0202  Gastrointestinal Panel by PCR , Stool  (Gastrointestinal Panel by PCR, Stool)  Once,   R     04/13/19 0202          Vitals/Pain Today's Vitals   04/13/19 0157 04/13/19 0159 04/13/19 0321 04/13/19 0336  BP:    (!) 185/81  Pulse:    65  Resp:    13  SpO2: 97%   100%  Weight:   66.2 kg   Height:   5\' 3"  (1.6 m)   PainSc:  10-Worst pain ever      Isolation Precautions No active isolations  Medications Medications  aspirin chewable tablet 81 mg (has no administration in time range)  oxyCODONE-acetaminophen (PERCOCET) 10-325 MG per tablet 1 tablet (has no administration in time range)  amLODipine (NORVASC) tablet 10 mg (has no administration in time range)  cloNIDine (CATAPRES) tablet 0.2 mg  (has no administration in time range)  ezetimibe (ZETIA) tablet 10 mg (has no administration in time range)  isosorbide mononitrate (IMDUR) 24 hr tablet 90 mg (has no administration in time range)  nitroGLYCERIN (NITROSTAT) SL tablet 0.4 mg (has no administration in time range)  rosuvastatin (CRESTOR) tablet 40 mg (has no administration in time range)  ALPRAZolam (XANAX) tablet 0.5 mg (has no administration in time range)  pantoprazole (PROTONIX) EC tablet 40 mg (has no administration in time range)  ferrous sulfate tablet 325 mg (has no administration in time range)  rivaroxaban (XARELTO) tablet 20 mg (has no administration in time range)  gabapentin (NEURONTIN) tablet 300 mg (has no administration in time range)  tiZANidine (ZANAFLEX) tablet 4-8 mg (has no administration in time range)  albuterol (VENTOLIN HFA) 108 (90 Base) MCG/ACT  inhaler 2 puff (has no administration in time range)  0.9 %  sodium chloride infusion (has no administration in time range)  dextrose 5 %-0.45 % sodium chloride infusion (has no administration in time range)  insulin regular, human (MYXREDLIN) 100 units/ 100 mL infusion (has no administration in time range)  sodium chloride 0.9 % bolus 2,000 mL (2,000 mLs Intravenous New Bag/Given 04/13/19 0308)  ondansetron (ZOFRAN) injection 4 mg (4 mg Intravenous Given 04/13/19 0309)  dicyclomine (BENTYL) injection 20 mg (20 mg Intramuscular Given 04/13/19 0311)    Mobility walks with person assist

## 2019-04-14 DIAGNOSIS — E876 Hypokalemia: Secondary | ICD-10-CM

## 2019-04-14 DIAGNOSIS — G629 Polyneuropathy, unspecified: Secondary | ICD-10-CM

## 2019-04-14 DIAGNOSIS — E1010 Type 1 diabetes mellitus with ketoacidosis without coma: Secondary | ICD-10-CM | POA: Diagnosis not present

## 2019-04-14 DIAGNOSIS — E1110 Type 2 diabetes mellitus with ketoacidosis without coma: Secondary | ICD-10-CM

## 2019-04-14 DIAGNOSIS — D72829 Elevated white blood cell count, unspecified: Secondary | ICD-10-CM

## 2019-04-14 DIAGNOSIS — I248 Other forms of acute ischemic heart disease: Secondary | ICD-10-CM

## 2019-04-14 DIAGNOSIS — M87022 Idiopathic aseptic necrosis of left humerus: Secondary | ICD-10-CM

## 2019-04-14 LAB — BASIC METABOLIC PANEL
Anion gap: 8 (ref 5–15)
BUN: 18 mg/dL (ref 8–23)
CO2: 18 mmol/L — ABNORMAL LOW (ref 22–32)
Calcium: 8.3 mg/dL — ABNORMAL LOW (ref 8.9–10.3)
Chloride: 113 mmol/L — ABNORMAL HIGH (ref 98–111)
Creatinine, Ser: 1.41 mg/dL — ABNORMAL HIGH (ref 0.44–1.00)
GFR calc Af Amer: 46 mL/min — ABNORMAL LOW (ref >60)
GFR calc non Af Amer: 40 mL/min — ABNORMAL LOW (ref >60)
Glucose, Bld: 100 mg/dL — ABNORMAL HIGH (ref 70–99)
Potassium: 3.7 mmol/L (ref 3.5–5.1)
Sodium: 139 mmol/L (ref 135–145)

## 2019-04-14 LAB — CBC
HCT: 39.9 % (ref 36.0–46.0)
Hemoglobin: 12 g/dL (ref 12.0–15.0)
MCH: 21.5 pg — ABNORMAL LOW (ref 26.0–34.0)
MCHC: 30.1 g/dL (ref 30.0–36.0)
MCV: 71.6 fL — ABNORMAL LOW (ref 80.0–100.0)
Platelets: 282 10*3/uL (ref 150–400)
RBC: 5.57 MIL/uL — ABNORMAL HIGH (ref 3.87–5.11)
RDW: 20 % — ABNORMAL HIGH (ref 11.5–15.5)
WBC: 14.8 10*3/uL — ABNORMAL HIGH (ref 4.0–10.5)
nRBC: 0 % (ref 0.0–0.2)

## 2019-04-14 LAB — GLUCOSE, CAPILLARY
Glucose-Capillary: 269 mg/dL — ABNORMAL HIGH (ref 70–99)
Glucose-Capillary: 315 mg/dL — ABNORMAL HIGH (ref 70–99)

## 2019-04-14 LAB — HEPATITIS PANEL, ACUTE
HCV Ab: <0.1 s/co ratio (ref 0.0–0.9)
Hep A IgM: NEGATIVE
Hep B C IgM: NEGATIVE
Hepatitis B Surface Ag: NEGATIVE

## 2019-04-14 MED ORDER — INSULIN DEGLUDEC 200 UNIT/ML ~~LOC~~ SOPN: 68.0000 [IU] | PEN_INJECTOR | Freq: Every day | SUBCUTANEOUS | 11 refills | Status: DC

## 2019-04-14 MED ORDER — LISINOPRIL 20 MG PO TABS: 20.0000 mg | ORAL_TABLET | Freq: Every day | ORAL | 0 refills | Status: DC

## 2019-04-14 MED ORDER — INSULIN ASPART 100 UNIT/ML ~~LOC~~ SOLN: 5.0000 [IU] | Freq: Three times a day (TID) | SUBCUTANEOUS | Status: DC

## 2019-04-14 NOTE — Evaluation (Signed)
Physical Therapy Evaluation Patient Details Name: CHANON LONEY MRN: [id number] DOB: [birth year] Today's Date: 04/14/2019   History of Present Illness  Patient is a 63 y/o female presenting to the ED on 04/13/2019 with Nausea vomiting diarrhea, and Elevated blood sugar. Past history of diabetes mellitus type 2, chronic kidney disease stage III, peripheral vascular disease, DVT on Xarelto, CAD status post stenting. Admitted for Diabetic ketoacidosis.    Clinical Impression  Patient admitted with the above listed diagnosis. Patient reports Mod I with mobility with some assist needed for ADLs prior to admission. Patient today requiring Min guard for OOB mobility for safety; patient utilizing [facility] with appropriate safety awareness with device. Patient reporting fatigue limiting gait distance this date. Will recommend HHPT at discharge. PT to follow acutely.     Follow Up Recommendations Home health PT;Supervision - Intermittent    Equipment Recommendations  3in1 (PT)    Recommendations for Other Services       Precautions / Restrictions Precautions Precautions: Fall Restrictions Weight Bearing Restrictions: No      Mobility  Bed Mobility Overal bed mobility: Modified Independent                Transfers Overall transfer level: Needs assistance Equipment used: Straight cane Transfers: Sit to/from Stand Sit to Stand: Min guard         General transfer comment: for safety and immediate standing balance  Ambulation/Gait Ambulation/Gait assistance: Min guard Gait Distance (Feet): 60 Feet(x2) Assistive device: Straight cane Gait Pattern/deviations: Step-through pattern;Decreased stride length Gait velocity: decreased   General Gait Details: slow steady pace of gait; patient reports fatigue limiting. No LOB; use of pillow to brace shoulder  Stairs            Wheelchair Mobility    Modified Rankin (Stroke Patients Only)       Balance Overall balance  assessment: Mild deficits observed, not formally tested                                           Pertinent Vitals/Pain Pain Assessment: Faces Faces Pain Scale: Hurts even more Pain Location: L shoulder Pain Descriptors / Indicators: Aching;Discomfort;Grimacing;Guarding Pain Intervention(s): Limited activity within patient's tolerance;Premedicated before session;Repositioned;Heat applied(braced with pillow during session)    Home Living Family/patient expects to be discharged to:: Private residence Living Arrangements: Children;Other relatives Available Help at Discharge: Family;Friend(s);Available PRN/intermittently Type of Home: House Home Access: Stairs to enter Entrance Stairs-Rails: Right Entrance Stairs-Number of Steps: 2 Home Layout: One level Home Equipment: Cane - single point;Shower seat;Wheelchair - [employer] - 2 wheels      Prior Function Level of Independence: Needs assistance   Gait / Transfers Assistance Needed: use of SPC for mobility  ADL's / Homemaking Assistance Needed: states family was assisting some with ADLs        Hand Dominance        Extremity/Trunk Assessment   Upper Extremity Assessment Upper Extremity Assessment: Defer to OT evaluation    Lower Extremity Assessment Lower Extremity Assessment: Generalized weakness    Cervical / Trunk Assessment Cervical / Trunk Assessment: Normal  Communication   Communication: No difficulties  Cognition Arousal/Alertness: Awake/alert Behavior During Therapy: WFL for tasks assessed/performed Overall Cognitive Status: Within Functional Limits for tasks assessed  General Comments      Exercises     Assessment/Plan    PT Assessment Patient needs continued PT services  PT Problem List Decreased strength;Decreased activity tolerance;Decreased balance;Decreased mobility;Decreased safety awareness       PT Treatment  Interventions DME instruction;Gait training;Stair training;Functional mobility training;Therapeutic activities;Therapeutic exercise;Balance training;Patient/family education    PT Goals (Current goals can be found in the Care Plan section)  Acute Rehab PT Goals Patient Stated Goal: return home today PT Goal Formulation: With patient Time For Goal Achievement: 04/28/19 Potential to Achieve Goals: Good    Frequency Min 3X/week   Barriers to discharge        Co-evaluation               AM-PAC PT "6 Clicks" Mobility  Outcome Measure Help needed turning from your back to your side while in a flat bed without using bedrails?: A Little Help needed moving from lying on your back to sitting on the side of a flat bed without using bedrails?: A Little Help needed moving to and from a bed to a chair (including a wheelchair)?: A Little Help needed standing up from a chair using your arms (e.g., wheelchair or bedside chair)?: A Little Help needed to walk in hospital room?: A Little Help needed climbing 3-5 steps with a railing? : A Little 6 Click Score: 18    End of Session Equipment Utilized During Treatment: Gait belt Activity Tolerance: Patient tolerated treatment well Patient left: in bed;with call bell/phone within reach Nurse Communication: Mobility status PT Visit Diagnosis: Unsteadiness on feet (R26.81);Other abnormalities of gait and mobility (R26.89);Muscle weakness (generalized) (M62.81)    Time: 8250-0370 PT Time Calculation (min) (ACUTE ONLY): 18 min   Charges:   PT Evaluation $PT Eval Moderate Complexity: 1 Mod          Lanney Gins, PT, DPT Supplemental Physical Therapist 04/14/19 8:47 AM Pager: [phone] Office: [phone]

## 2019-04-14 NOTE — Progress Notes (Signed)
OT Cancellation Note  Patient Details Name: Alejandra Hernandez MRN: [id number] DOB: 1956-11-22   Cancelled Treatment:    Reason Eval/Treat Not Completed: Other (comment). Pt is ready to d/c home. Checked with her for needs. She has family assist with adls at baseline and a 3:1 was ordered for her.  She did not have any immediate acute OT needs  Charlotte Fidalgo 04/14/2019, 1:17 PM  Lesle Chris, OTR/L Acute Rehabilitation Services [phone] [facility] pager [phone] office 04/14/2019
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
## 2019-04-14 NOTE — Progress Notes (Signed)
Inpatient Diabetes Program Recommendations  AACE/ADA: New Consensus Statement on Inpatient Glycemic Control (2015)  Target Ranges:  Prepandial:   less than 140 mg/dL      Peak postprandial:   less than 180 mg/dL (1-2 hours)      Critically ill patients:  140 - 180 mg/dL   Lab Results  Component Value Date   GLUCAP 269 (H) 04/14/2019   HGBA1C 13.3 (A) 12/30/2018    Review of Glycemic Control  Diabetes history: DM2 (Reviewed with patient & note from [facility] states type 2) Outpatient Diabetes medications: Tresiba 68 units daily + Humalog 4-5 units ac meals Current orders for Inpatient glycemic control: Lantus 68 units + Novolog moderate tid + hs 0-5 units   Inpatient Diabetes Program Recommendations:   -Add Novolog 5 units tid meal coverage if eats 50%  Thank you, Bethena Roys E. Blayke Cordrey, RN, MSN, CDE  Diabetes Coordinator Inpatient Glycemic Control Team Team Pager [phone] (8am-5pm) 04/14/2019 9:51 AM
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
## 2019-04-14 NOTE — TOC Initial Note (Signed)
Transition of Care [facility]) - Initial/Assessment Note    Patient Details  Name: Alejandra Hernandez MRN: [id number] Date of Birth: 1956-09-20  Transition of Care [facility]) CM/SW Contact:    Wende Neighbors, LCSW Phone Number: 04/14/2019, 11:49 AM  Clinical Narrative:  Patient stated she lives at home with family members and they are able to help her with her needs. Patient stated she is agreeable to return home with home health  And would prefer home health agency [facility]) she has had in the past.   CSW spoke with Danny Lawless [facility]) and he stated he will be able to resume home health for patient.  CSW spoke with Zack and ordered patient;s 3 in 1 for delivery. Zack stated they will drop off equipment at patients bedside prior to discharge.               Expected Discharge Plan: [place] Barriers to Discharge: No Barriers Identified   Patient Goals and CMS Choice Patient states their goals for this hospitalization and ongoing recovery are:: to go home with family CMS Medicare.gov Compare Post Acute Care list provided to:: Patient Choice offered to / list presented to : Patient(pt stated she would like to continue with her home health from the past )  Expected Discharge Plan and Services Expected Discharge Plan: [place] In-house Referral: Clinical Social Work   Post Acute Care Choice: [place] arrangements for the past 2 months: [employer] Expected Discharge Date: 04/14/19               DME Arranged: 3-N-1 DME Agency: [employer] Date DME Agency Contacted: 04/14/19 Time DME Agency Contacted: 32 Representative spoke with at DME Agency: [place] Arranged: PT [place]: [place] Date [place]: 04/14/19 Time [place]: 83 Representative spoke with at [place]: Danny Lawless  Prior Living Arrangements/Services Living arrangements for the past 2 months: [place] with::  Self, Relatives Patient language and need for interpreter reviewed:: Yes Do you feel safe going back to the place where you live?: Yes      Need for Family Participation in Patient Care: Yes (Comment)   Current home services: Home PT Criminal Activity/Legal Involvement Pertinent to Current Situation/Hospitalization: No - Comment as needed  Activities of Daily Living Home Assistive Devices/Equipment: Cane (specify quad or straight), Eyeglasses, CBG Meter(single point cane) ADL Screening (condition at time of admission) Patient's cognitive ability adequate to safely complete daily activities?: Yes Is the patient deaf or have difficulty hearing?: No Does the patient have difficulty seeing, even when wearing glasses/contacts?: No Does the patient have difficulty concentrating, remembering, or making decisions?: No Patient able to express need for assistance with ADLs?: Yes Does the patient have difficulty dressing or bathing?: Yes Independently performs ADLs?: No Communication: Independent Dressing (OT): Needs assistance Is this a change from baseline?: Change from baseline, expected to last >3 days Grooming: Needs assistance Is this a change from baseline?: Change from baseline, expected to last >3 days Feeding: Needs assistance Is this a change from baseline?: Change from baseline, expected to last >3 days Bathing: Needs assistance Is this a change from baseline?: Change from baseline, expected to last >3 days Toileting: Needs assistance Is this a change from baseline?: Change from baseline, expected to last >3days In/Out Bed: Needs assistance Is this a change from baseline?: Change from baseline, expected to last >3 days Walks in Home: Needs assistance Is  this a change from baseline?: Change from baseline, expected to last >3 days Does the patient have difficulty walking or climbing stairs?: Yes Weakness of Legs: Both Weakness of Arms/Hands: Both  Permission  Sought/Granted Permission sought to share information with : Family Supports Permission granted to share information with : Yes, Verbal Permission Granted  Share Information with NAME: Demetress Tift  Permission granted to share info w AGENCY: [facility]  Permission granted to share info w Relationship: son  Permission granted to share info w Contact Information: [phone]  Emotional Assessment Appearance:: Appears stated age Attitude/Demeanor/Rapport: Engaged Affect (typically observed): Accepting, Pleasant Orientation: : Oriented to  Time, Oriented to Situation, Oriented to Place, Oriented to Self Alcohol / Substance Use: Not Applicable Psych Involvement: No (comment)  Admission diagnosis:  Hyperglycemia [R73.9] Nausea, vomiting and diarrhea [R11.2, R19.7] Acute urinary retention [R33.8] Frequent falls [R29.6] Nausea vomiting and diarrhea [R11.2, R19.7] Patient Active Problem List   Diagnosis Date Noted  . Demand ischemia ([place]) 04/14/2019  . Leukocytosis 04/14/2019  . Avascular necrosis of humeral head, left ([place]) 04/14/2019  . Hypokalemia 04/14/2019  . Neuropathy 04/14/2019  . Nausea vomiting and diarrhea 04/13/2019  . DKA (diabetic ketoacidoses) ([place]) 04/13/2019  . Polyp of transverse colon   . Noninfectious gastroenteritis   . Syncope and collapse 01/16/2019  . Intractable vomiting with nausea 11/18/2018  . Atypical chest pain 11/16/2018  . Troponin level elevated 11/16/2018  . Type 1 diabetes ([place]) 05/23/2018  . CKD (chronic kidney disease), stage III ([place]) 05/14/2018  . Unstable angina ([place]) 05/14/2018  . Hemoptysis 02/24/2018  . Hyperglycemia 12/23/2017  . Low TSH level 06/22/2017  . CAD (coronary artery disease)   . Nodule on liver 05/22/2017  . Coronary artery calcification seen on CAT scan 05/14/2017  . Heart palpitations 05/14/2017  . Unspecified skin changes 04/18/2017  . History of tobacco use 03/17/2017  . Benign neoplasm of descending colon    . Benign neoplasm of sigmoid colon   . Chronic diastolic CHF (congestive heart failure) ([place]) 02/09/2017  . History of DVT (deep vein thrombosis)   . Abnormal CT scan, chest   . Acute urinary retention   . Claudication ([place]) 07/23/2016  . PAD (peripheral artery disease) ([place])   . Essential hypertension 05/14/2016  . Hyponatremia   . GERD (gastroesophageal reflux disease) 05/17/2014  . Carotid artery disease ([place]) 05/17/2014  . Iron deficiency anemia 04/26/2014  . Chest pain 04/25/2014  . Dyspnea 04/25/2014  . Carotid artery bruit 04/25/2014  . Hyperlipidemia with target low density lipoprotein (LDL) cholesterol less than 70 mg/dL   . Gout    PCP:  Antonietta Jewel, MD Pharmacy:   [facility], [place] [address] Phone: [phone] Fax: [phone]  [id number] - [place], [place] - [address] [place] [address] Phone: [phone] Fax: [phone]     Social Determinants of Health (SDOH) Interventions    Readmission Risk Interventions No flowsheet data found.

## 2019-04-14 NOTE — TOC Transition Note (Signed)
Transition of Care [facility]) - CM/SW Discharge Note   Patient Details  Name: Alejandra Hernandez MRN: [id number] Date of Birth: 1955/12/20  Transition of Care [facility]) CM/SW Contact:  Wende Neighbors, LCSW Phone Number: 04/14/2019, 12:00 PM   Clinical Narrative:  Patient to discharge home with family. [place] has been set up for patients home health. 3 in 1 will be delivered at patients bedside prior to discharge      Final next level of care: [place] Barriers to Discharge: No Barriers Identified   Patient Goals and CMS Choice Patient states their goals for this hospitalization and ongoing recovery are:: to go home with family CMS Medicare.gov Compare Post Acute Care list provided to:: Patient Choice offered to / list presented to : Patient(pt stated she would like to continue with her home health from the past )  Discharge Placement                Patient to be transferred to facility by: family will pick patient up  Name of family member notified: pt made family aware Patient and family notified of of transfer: 04/14/19  Discharge Plan and Services In-house Referral: Clinical Social Work   Post Acute Care Choice: Home Health          DME Arranged: 3-N-1 DME Agency: [employer] Date DME Agency Contacted: 04/14/19 Time DME Agency Contacted: 27 Representative spoke with at DME Agency: [place]: PT [place]: [place] Date [place]: 04/14/19 Time [place]: 59 Representative spoke with at [place]: [place] (SDOH) Interventions     Readmission Risk Interventions No flowsheet data found.

## 2019-04-14 NOTE — Progress Notes (Addendum)
No changes from am assessment. Pt remain stable A&Ox4, ambulatory without assistance with cane. Discharge instructions were reviewed, questions concerns denied.

## 2019-04-15 ENCOUNTER — Other Ambulatory Visit: Payer: Self-pay

## 2019-04-15 ENCOUNTER — Encounter: Payer: Self-pay | Admitting: Cardiology

## 2019-04-15 ENCOUNTER — Ambulatory Visit: Payer: Medicare Other | Admitting: Cardiology

## 2019-04-15 ENCOUNTER — Telehealth (INDEPENDENT_AMBULATORY_CARE_PROVIDER_SITE_OTHER): Payer: Medicare Other | Admitting: Cardiology

## 2019-04-15 ENCOUNTER — Telehealth: Payer: Self-pay

## 2019-04-15 VITALS — BP 130/73 | HR 65 | Ht 63.0 in | Wt 140.0 lb

## 2019-04-15 DIAGNOSIS — I2510 Atherosclerotic heart disease of native coronary artery without angina pectoris: Secondary | ICD-10-CM

## 2019-04-15 DIAGNOSIS — N183 Chronic kidney disease, stage 3 unspecified: Secondary | ICD-10-CM

## 2019-04-15 DIAGNOSIS — Z86718 Personal history of other venous thrombosis and embolism: Secondary | ICD-10-CM

## 2019-04-15 DIAGNOSIS — I6523 Occlusion and stenosis of bilateral carotid arteries: Secondary | ICD-10-CM

## 2019-04-15 DIAGNOSIS — E785 Hyperlipidemia, unspecified: Secondary | ICD-10-CM

## 2019-04-15 DIAGNOSIS — Z7189 Other specified counseling: Secondary | ICD-10-CM

## 2019-04-15 DIAGNOSIS — I5032 Chronic diastolic (congestive) heart failure: Secondary | ICD-10-CM

## 2019-04-15 DIAGNOSIS — I10 Essential (primary) hypertension: Secondary | ICD-10-CM

## 2019-04-15 NOTE — Patient Instructions (Signed)
Medication Instructions:  Your physician recommends that you continue on your current medications as directed. Please refer to the Current Medication list given to you today.  If you need a refill on your cardiac medications before your next appointment, please call your pharmacy.   Lab work: None If you have labs (blood work) drawn today and your tests are completely normal, you will receive your results only by: Marland Kitchen MyChart Message (if you have MyChart) OR . A paper copy in the mail If you have any lab test that is abnormal or we need to change your treatment, we will call you to review the results.  Testing/Procedures: None  Follow-Up:Your physician recommends that you schedule a follow-up appointment in: 4 weeks with the PA.  You have been referred to Vascular Surgery for Carotid stenosis

## 2019-04-15 NOTE — Telephone Encounter (Signed)
YOUR CARDIOLOGY TEAM HAS ARRANGED FOR AN E-VISIT FOR YOUR APPOINTMENT - PLEASE REVIEW IMPORTANT INFORMATION BELOW SEVERAL DAYS PRIOR TO YOUR APPOINTMENT ° °Due to the recent COVID-19 pandemic, we are transitioning in-person office visits to tele-medicine visits in an effort to decrease unnecessary exposure to our patients, their families, and staff. These visits are billed to your insurance just like a normal visit is. We also encourage you to sign up for MyChart if you have not already done so. You will need a smartphone if possible. For patients that do not have this, we can still complete the visit using a regular telephone but do prefer a smartphone to enable video when possible. You may have a family member that lives with you that can help. If possible, we also ask that you have a blood pressure cuff and scale at home to measure your blood pressure, heart rate and weight prior to your scheduled appointment. Patients with clinical needs that need an in-person evaluation and testing will still be able to come to the office if absolutely necessary. If you have any questions, feel free to call our office. ° ° ° ° °YOUR PROVIDER WILL BE USING THE FOLLOWING PLATFORM TO COMPLETE YOUR VISIT: Phone Call ° °• IF USING MYCHART - How to Download the MyChart App to Your SmartPhone  ° °- If Apple, go to App Store and type in MyChart in the search bar and download the app. If Android, ask patient to go to Google Play Store and type in MyChart in the search bar and download the app. The app is free but as with any other app downloads, your phone may require you to verify saved payment information or Apple/Android password.  °- You will need to then log into the app with your MyChart username and password, and select [facility] as your healthcare provider to link the account.  °- When it is time for your visit, go to the MyChart app, find appointments, and click Begin Video Visit. Be sure to Select Allow for your device to  access the Microphone and Camera for your visit. You will then be connected, and your provider will be with you shortly. ° **If you have any issues connecting or need assistance, please contact MyChart service desk [phone] ([phone])** ° **If using a computer, in order to ensure the best quality for your visit, you will need to use either of the following Internet Browsers: Google Chrome or Microsoft Edge** ° °• IF USING DOXIMITY or DOXY.ME - The staff will give you instructions on receiving your link to join the meeting the day of your visit.  ° ° ° ° °2-3 DAYS BEFORE YOUR APPOINTMENT ° °You will receive a telephone call from one of our [facility]Care team members - your caller ID may say "Unknown caller." If this is a video visit, we will walk you through how to get the video launched on your phone. We will remind you check your blood pressure, heart rate and weight prior to your scheduled appointment. If you have an Apple Watch or Kardia, please upload any pertinent ECG strips the day before or morning of your appointment to MyChart. Our staff will also make sure you have reviewed the consent and agree to move forward with your scheduled tele-health visit.  ° ° ° °THE DAY OF YOUR APPOINTMENT ° °Approximately 15 minutes prior to your scheduled appointment, you will receive a telephone call from one of HeartCare team - your caller ID may say "Unknown caller."    Our staff will confirm medications, vital signs for the day and any symptoms you may be experiencing. Please have this information available prior to the time of visit start. It may also be helpful for you to have a pad of paper and pen handy for any instructions given during your visit. They will also walk you through joining the smartphone meeting if this is a video visit.    CONSENT FOR TELE-HEALTH VISIT - PLEASE REVIEW  I hereby voluntarily request, consent and authorize [facility] and its employed or contracted physicians, physician  assistants, nurse practitioners or other licensed health care professionals (the Practitioner), to provide me with telemedicine health care services (the "Services") as deemed necessary by the treating Practitioner. I acknowledge and consent to receive the Services by the Practitioner via telemedicine. I understand that the telemedicine visit will involve communicating with the Practitioner through live audiovisual communication technology and the disclosure of certain medical information by electronic transmission. I acknowledge that I have been given the opportunity to request an in-person assessment or other available alternative prior to the telemedicine visit and am voluntarily participating in the telemedicine visit.  I understand that I have the right to withhold or withdraw my consent to the use of telemedicine in the course of my care at any time, without affecting my right to future care or treatment, and that the Practitioner or I may terminate the telemedicine visit at any time. I understand that I have the right to inspect all information obtained and/or recorded in the course of the telemedicine visit and may receive copies of available information for a reasonable fee.  I understand that some of the potential risks of receiving the Services via telemedicine include:  . Delay or interruption in medical evaluation due to technological equipment failure or disruption; . Information transmitted may not be sufficient (e.g. poor resolution of images) to allow for appropriate medical decision making by the Practitioner; and/or  . In rare instances, security protocols could fail, causing a breach of personal health information.  Furthermore, I acknowledge that it is my responsibility to provide information about my medical history, conditions and care that is complete and accurate to the best of my ability. I acknowledge that Practitioner's advice, recommendations, and/or decision may be based on  factors not within their control, such as incomplete or inaccurate data provided by me or distortions of diagnostic images or specimens that may result from electronic transmissions. I understand that the practice of medicine is not an exact science and that Practitioner makes no warranties or guarantees regarding treatment outcomes. I acknowledge that I will receive a copy of this consent concurrently upon execution via email to the email address I last provided but may also request a printed copy by calling the office of [facility].    I understand that my insurance will be billed for this visit.   I have read or had this consent read to me. . I understand the contents of this consent, which adequately explains the benefits and risks of the Services being provided via telemedicine.  . I have been provided ample opportunity to ask questions regarding this consent and the Services and have had my questions answered to my satisfaction. . I give my informed consent for the services to be provided through the use of telemedicine in my medical care  By participating in this telemedicine visit I agree to the above.  

## 2019-04-17 ENCOUNTER — Other Ambulatory Visit: Payer: Self-pay | Admitting: Cardiology

## 2019-04-19 ENCOUNTER — Telehealth: Payer: Self-pay

## 2019-04-19 NOTE — Telephone Encounter (Signed)
Canceled 03/09/19 appt. Called to reschedule appt. LVM requesting returned call.

## 2019-04-25 ENCOUNTER — Telehealth: Payer: Self-pay

## 2019-04-25 NOTE — Telephone Encounter (Signed)
Dr Ardis Hughs the pt had labs completed on 5/21 while in the hospital. Per your note on path you wanted repeat labs in 2 months and f/u.  Please review and advise

## 2019-04-25 NOTE — Telephone Encounter (Signed)
-----   Message from Hughie Closs, RN sent at 02/21/2019  1:52 PM EDT ----- Regarding: CBC Pt need CBC done and OV after labs

## 2019-04-26 NOTE — Telephone Encounter (Signed)
Labs look OK.  Still would like to see her in the next few weeks for ROV (in person or telemed)

## 2019-04-27 NOTE — Telephone Encounter (Signed)
The pt has been scheduled and advised

## 2019-05-02 ENCOUNTER — Telehealth: Payer: Self-pay

## 2019-05-02 ENCOUNTER — Other Ambulatory Visit: Payer: Self-pay | Admitting: Cardiovascular Disease

## 2019-05-02 DIAGNOSIS — I739 Peripheral vascular disease, unspecified: Secondary | ICD-10-CM

## 2019-05-02 NOTE — Telephone Encounter (Signed)

## 2019-05-10 ENCOUNTER — Encounter: Payer: Self-pay | Admitting: Physician Assistant

## 2019-05-10 ENCOUNTER — Ambulatory Visit (INDEPENDENT_AMBULATORY_CARE_PROVIDER_SITE_OTHER): Payer: Medicare Other | Admitting: Gastroenterology

## 2019-05-10 ENCOUNTER — Telehealth (INDEPENDENT_AMBULATORY_CARE_PROVIDER_SITE_OTHER): Payer: Medicare Other | Admitting: Physician Assistant

## 2019-05-10 ENCOUNTER — Encounter: Payer: Self-pay | Admitting: Sports Medicine

## 2019-05-10 ENCOUNTER — Encounter: Payer: Self-pay | Admitting: Gastroenterology

## 2019-05-10 ENCOUNTER — Other Ambulatory Visit: Payer: Self-pay

## 2019-05-10 ENCOUNTER — Ambulatory Visit (INDEPENDENT_AMBULATORY_CARE_PROVIDER_SITE_OTHER): Payer: Medicare Other | Admitting: Sports Medicine

## 2019-05-10 VITALS — BP 133/69 | HR 60 | Ht 63.0 in | Wt 138.0 lb

## 2019-05-10 VITALS — Temp 97.4°F

## 2019-05-10 VITALS — Ht 63.0 in | Wt 137.0 lb

## 2019-05-10 DIAGNOSIS — M79675 Pain in left toe(s): Secondary | ICD-10-CM | POA: Diagnosis not present

## 2019-05-10 DIAGNOSIS — N183 Chronic kidney disease, stage 3 unspecified: Secondary | ICD-10-CM

## 2019-05-10 DIAGNOSIS — M19079 Primary osteoarthritis, unspecified ankle and foot: Secondary | ICD-10-CM

## 2019-05-10 DIAGNOSIS — M79674 Pain in right toe(s): Secondary | ICD-10-CM

## 2019-05-10 DIAGNOSIS — I130 Hypertensive heart and chronic kidney disease with heart failure and stage 1 through stage 4 chronic kidney disease, or unspecified chronic kidney disease: Secondary | ICD-10-CM | POA: Diagnosis not present

## 2019-05-10 DIAGNOSIS — B351 Tinea unguium: Secondary | ICD-10-CM | POA: Diagnosis not present

## 2019-05-10 DIAGNOSIS — IMO0002 Reserved for concepts with insufficient information to code with codable children: Secondary | ICD-10-CM

## 2019-05-10 DIAGNOSIS — D509 Iron deficiency anemia, unspecified: Secondary | ICD-10-CM

## 2019-05-10 DIAGNOSIS — I5032 Chronic diastolic (congestive) heart failure: Secondary | ICD-10-CM | POA: Diagnosis not present

## 2019-05-10 DIAGNOSIS — I2510 Atherosclerotic heart disease of native coronary artery without angina pectoris: Secondary | ICD-10-CM

## 2019-05-10 DIAGNOSIS — E1140 Type 2 diabetes mellitus with diabetic neuropathy, unspecified: Secondary | ICD-10-CM

## 2019-05-10 DIAGNOSIS — Z86718 Personal history of other venous thrombosis and embolism: Secondary | ICD-10-CM

## 2019-05-10 DIAGNOSIS — E785 Hyperlipidemia, unspecified: Secondary | ICD-10-CM

## 2019-05-10 DIAGNOSIS — I10 Essential (primary) hypertension: Secondary | ICD-10-CM

## 2019-05-10 DIAGNOSIS — E1165 Type 2 diabetes mellitus with hyperglycemia: Secondary | ICD-10-CM

## 2019-05-10 MED ORDER — DICLOFENAC SODIUM 1 % TD GEL: 4.0000 g | Freq: Four times a day (QID) | TRANSDERMAL | 1 refills | Status: DC
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
## 2019-05-10 NOTE — Progress Notes (Signed)
Subjective: AVELEEN NEVERS is a 63 y.o. female patient with history of diabetes who presents to office today complaining of long,mildly painful nails  while ambulating in shoes; unable to trim. Patient states that the glucose reading this morning was elevated on new flex meter. Last A1c 14. Reports issue with Left shoulder issues undergoing botox treatment. No other issues noted. Patient reports that she wants a refill on her pain cream and to be measured for diabetic shoes today as well. No other issues.   Patient Active Problem List   Diagnosis Date Noted  . Leukocytosis 04/14/2019  . Avascular necrosis of humeral head, left ([place]) 04/14/2019  . Hypokalemia 04/14/2019  . Neuropathy 04/14/2019  . Nausea vomiting and diarrhea 04/13/2019  . DKA (diabetic ketoacidoses) ([place]) 04/13/2019  . Polyp of transverse colon   . Noninfectious gastroenteritis   . Syncope and collapse 01/16/2019  . Intractable vomiting with nausea 11/18/2018  . Atypical chest pain 11/16/2018  . Troponin level elevated 11/16/2018  . Type 1 diabetes ([place]) 05/23/2018  . CKD (chronic kidney disease), stage III ([place]) 05/14/2018  . Hemoptysis 02/24/2018  . Hyperglycemia 12/23/2017  . Low TSH level 06/22/2017  . CAD (coronary artery disease)   . Nodule on liver 05/22/2017  . Heart palpitations 05/14/2017  . Unspecified skin changes 04/18/2017  . History of tobacco use 03/17/2017  . Benign neoplasm of descending colon   . Benign neoplasm of sigmoid colon   . Chronic diastolic CHF (congestive heart failure) ([place]) 02/09/2017  . History of DVT (deep vein thrombosis)   . Abnormal CT scan, chest   . Acute urinary retention   . Claudication ([place]) 07/23/2016  . PAD (peripheral artery disease) ([place])   . Essential hypertension 05/14/2016  . Hyponatremia   . GERD (gastroesophageal reflux disease) 05/17/2014  . Carotid artery disease ([place]) 05/17/2014  . Iron deficiency anemia 04/26/2014  . Chest pain 04/25/2014  . Dyspnea  04/25/2014  . Carotid artery bruit 04/25/2014  . Hyperlipidemia with target low density lipoprotein (LDL) cholesterol less than 70 mg/dL   . Gout    Current Outpatient Medications on File Prior to Visit  Medication Sig Dispense Refill  . acetaminophen (TYLENOL) 325 MG tablet Take 325 mg by mouth every 6 (six) hours as needed for headache (pain).    Marland Kitchen albuterol (PROAIR HFA) 108 (90 Base) MCG/ACT inhaler Inhale 2 puffs into the lungs every 4 (four) hours as needed for wheezing or shortness of breath. 1 Inhaler 5  . ALPRAZolam (XANAX) 0.5 MG tablet Take 0.5 mg by mouth at bedtime.   2  . amLODipine (NORVASC) 10 MG tablet Take 10 mg by mouth at bedtime.    Marland Kitchen aspirin 81 MG chewable tablet Chew 1 tablet (81 mg total) by mouth daily. 30 tablet 3  . baclofen (LIORESAL) 10 MG tablet Take 10 mg by mouth 2 (two) times daily as needed for muscle spasms.    . Blood Glucose Monitoring Suppl (ACCU-CHEK AVIVA) device Use as instructed 1 each 0  . Calcium Carbonate Antacid (TUMS PO) Take 2-3 tablets by mouth 3 (three) times daily as needed (acid indigestion).    . ezetimibe (ZETIA) 10 MG tablet TAKE 1 TABLET(10 MG) BY MOUTH DAILY 90 tablet 3  . ferrous sulfate 325 (65 FE) MG tablet Take 325 mg by mouth 2 (two) times daily with a meal.    . furosemide (LASIX) 20 MG tablet Take 1 tablet (20 mg total) by mouth every morning. 30 tablet 5  .  gabapentin (NEURONTIN) 600 MG tablet Take 0.5 tablets (300 mg total) by mouth 3 (three) times daily. 90 tablet 2  . glucose blood (ACCU-CHEK AVIVA PLUS) test strip 1 each by Other route 2 (two) times daily. And lancets 2/day 200 each 3  . hydroxypropyl methylcellulose / hypromellose (ISOPTO TEARS / GONIOVISC) 2.5 % ophthalmic solution Place 1 drop into both eyes 3 (three) times daily as needed for dry eyes.    . Insulin Degludec (TRESIBA FLEXTOUCH) 200 UNIT/ML SOPN Inject 68 Units into the skin daily. And pen needles 1/day 9 pen 11  . insulin lispro (HUMALOG) 100 UNIT/ML  injection Inject 0-60 Units into the skin 3 (three) times daily before meals.     . isosorbide mononitrate (IMDUR) 30 MG 24 hr tablet Take 3 tablets (90 mg total) by mouth daily. 270 tablet 3  . nitroGLYCERIN (NITROSTAT) 0.4 MG SL tablet PLACE 1 TABLET UNDER THE TONGUE EVERY 5 MINUTES AS NEEDED FOR CHEST PAIN 25 tablet 3  . oxyCODONE-acetaminophen (PERCOCET) 10-325 MG tablet Take 1 tablet by mouth 3 (three) times daily.   0  . pantoprazole (PROTONIX) 40 MG tablet TAKE 1 TABLET BY MOUTH TWICE DAILY BEFORE A MEAL 180 tablet 0  . potassium chloride SA (K-DUR,KLOR-CON) 20 MEQ tablet Take 0.5 tablets (10 mEq total) by mouth daily. 90 tablet 1  . rivaroxaban (XARELTO) 20 MG TABS tablet Take 1 tablet (20 mg total) by mouth daily with supper. 30 tablet 11  . rosuvastatin (CRESTOR) 40 MG tablet Take 40 mg by mouth at bedtime.    . Vitamin D, Ergocalciferol, (DRISDOL) 50000 units CAPS capsule Take 50,000 Units by mouth every Wednesday.   10   No current facility-administered medications on file prior to visit.    Allergies  Allergen Reactions  . Lisinopril Hives    Pt reports  Provider took her off after rash hives      Objective: General: Patient is awake, alert, and oriented x 3 and in no acute distress.  Integument: Skin is warm, dry and supple bilateral. Nails are tender, long, thickened and  dystrophic with subungual debris, consistent with onychomycosis, 1-5 bilateral. No signs of infection. No open lesions or preulcerative lesions present bilateral. Remaining integument unremarkable.  Vasculature:  Dorsalis Pedis pulse 1/4 bilateral. Posterior Tibial pulse  1/4 bilateral.  Capillary fill time <3 sec 1-5 bilateral. Positive hair growth to the level of the digits. Temperature gradient within normal limits. No varicosities present bilateral. No edema present bilateral.   Neurology: The patient has diminished sensation measured with a 5.07/10g Semmes Weinstein Monofilament at all pedal sites  bilateral. Vibratory sensation diminished bilateral with tuning fork. No Babinski sign present bilateral.   Musculoskeletal: Asymptomatic hallux limitus, pes planus, hammertoe pedal deformities noted bilateral. Muscular strength 4/5 in all lower extremity muscular groups bilateral without pain on range of motion . No tenderness with calf compression bilateral.  Assessment and Plan: Problem List Items Addressed This Visit    None    Visit Diagnoses    Pain due to onychomycosis of toenails of both feet    -  Primary   Arthritis of foot       Relevant Medications   diclofenac sodium (VOLTAREN) 1 % GEL   Type 2 diabetes, uncontrolled, with neuropathy ([place])           -Examined patient. -Discussed and educated patient on diabetic foot care and glycemic control, especially with  regards to the vascular, neurological and musculoskeletal systems.  -Mechanically debrided all  nails 1-5 bilateral using sterile nail nipper and filed with dremel without incident  -Patient was measured for Diabetic shoes at this visit with Liliane Channel -Refill diclofenac cream  -Patient to return  in 3 months for at risk foot nail care and diabetic shoes dispensing when called -Patient advised to call the office if any problems or questions arise in the meantime.  Landis Martins, DPM
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
## 2019-05-10 NOTE — Patient Instructions (Signed)
Medication Instructions:  Your physician recommends that you continue on your current medications as directed. Please refer to the Current Medication list given to you today.  If you need a refill on your cardiac medications before your next appointment, please call your pharmacy.   Lab work: None Ordered  If you have labs (blood work) drawn today and your tests are completely normal, you will receive your results only by: Marland Kitchen MyChart Message (if you have MyChart) OR . A paper copy in the mail If you have any lab test that is abnormal or we need to change your treatment, we will call you to review the results.  Testing/Procedures: Your physician has requested that you have a lexiscan myoview. For further information please visit [email or web address]. Please follow instruction sheet, as given.  Follow-Up: Follow up with Ermalinda Barrios, PA in 2 weeks  Any Other Special Instructions Will Be Listed Below (If Applicable).

## 2019-05-10 NOTE — Patient Instructions (Addendum)
She will come in today for blood work, CBC.  After I review those results I will let her know if she can cut back her iron to 1 pill once daily dosing.  Thank you for entrusting me with your care and choosing [facility].  Dr Ardis Hughs
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
## 2019-05-12 ENCOUNTER — Ambulatory Visit (HOSPITAL_COMMUNITY)
Admission: RE | Admit: 2019-05-12 | Discharge: 2019-05-12 | Disposition: A | Discharge status: Home / Self Care | Payer: Medicare Other | Source: Ambulatory Visit | Attending: Internal Medicine | Admitting: Internal Medicine

## 2019-05-12 ENCOUNTER — Ambulatory Visit (HOSPITAL_BASED_OUTPATIENT_CLINIC_OR_DEPARTMENT_OTHER)
Admission: RE | Admit: 2019-05-12 | Discharge: 2019-05-12 | Disposition: A | Discharge status: Home / Self Care | Payer: Medicare Other | Source: Ambulatory Visit | Attending: Cardiovascular Disease | Admitting: Cardiovascular Disease

## 2019-05-12 ENCOUNTER — Other Ambulatory Visit (HOSPITAL_COMMUNITY): Payer: Self-pay | Admitting: Cardiovascular Disease

## 2019-05-12 ENCOUNTER — Telehealth (HOSPITAL_COMMUNITY): Payer: Self-pay | Admitting: *Deleted

## 2019-05-12 ENCOUNTER — Other Ambulatory Visit: Payer: Self-pay

## 2019-05-12 ENCOUNTER — Telehealth: Payer: Self-pay | Admitting: *Deleted

## 2019-05-12 DIAGNOSIS — Z9862 Peripheral vascular angioplasty status: Secondary | ICD-10-CM

## 2019-05-12 DIAGNOSIS — I739 Peripheral vascular disease, unspecified: Secondary | ICD-10-CM

## 2019-05-12 DIAGNOSIS — Z95820 Peripheral vascular angioplasty status with implants and grafts: Secondary | ICD-10-CM

## 2019-05-12 NOTE — Telephone Encounter (Signed)
Patient in office today for [place] of aorta/IVC/iliacs. She told Zigmund Daniel at check-in she was feeling poorly. When I went to check on patient she was eating peanut butter crackers. She stated she was diabetic and felt like her blood sugar had dropped. I did get her orange juice and she stated she was feeling better. Discussed with Varney Biles who is doing study. Checked on patient again and she stated she was continue to feel better.

## 2019-05-12 NOTE — Telephone Encounter (Signed)
Left message on voicemail per DPR in reference to upcoming appointment scheduled on 04/26/19 at 7:45 with detailed instructions given per Myocardial Perfusion Study Information Sheet for the test. LM to arrive 15 minutes early, and that it is imperative to arrive on time for appointment to keep from having the test rescheduled. If you need to cancel or reschedule your appointment, please call the office within 24 hours of your appointment. Failure to do so may result in a cancellation of your appointment, and a $50 no show fee. Phone number given for call back for any questions.

## 2019-05-16 ENCOUNTER — Ambulatory Visit (HOSPITAL_COMMUNITY): Payer: Medicare Other | Attending: Internal Medicine

## 2019-05-16 ENCOUNTER — Other Ambulatory Visit: Payer: Self-pay

## 2019-05-16 ENCOUNTER — Encounter (HOSPITAL_COMMUNITY): Payer: Self-pay

## 2019-05-16 DIAGNOSIS — N183 Chronic kidney disease, stage 3 unspecified: Secondary | ICD-10-CM

## 2019-05-16 DIAGNOSIS — I2510 Atherosclerotic heart disease of native coronary artery without angina pectoris: Secondary | ICD-10-CM | POA: Insufficient documentation

## 2019-05-16 DIAGNOSIS — Z86718 Personal history of other venous thrombosis and embolism: Secondary | ICD-10-CM | POA: Diagnosis present

## 2019-05-16 DIAGNOSIS — E785 Hyperlipidemia, unspecified: Secondary | ICD-10-CM | POA: Insufficient documentation

## 2019-05-16 DIAGNOSIS — I10 Essential (primary) hypertension: Secondary | ICD-10-CM | POA: Diagnosis not present

## 2019-05-16 DIAGNOSIS — I5032 Chronic diastolic (congestive) heart failure: Secondary | ICD-10-CM | POA: Diagnosis not present

## 2019-05-16 LAB — MYOCARDIAL PERFUSION IMAGING
LV dias vol: 70 mL (ref 46–106)
LV sys vol: 21 mL
Peak HR: 95 {beats}/min
Rest HR: 66 {beats}/min
SDS: 6
SRS: 6
SSS: 11
TID: 0.99

## 2019-05-16 MED ORDER — TECHNETIUM TC 99M TETROFOSMIN IV KIT
31.3000 | PACK | Freq: Once | INTRAVENOUS | Status: AC | PRN
  Administered 2019-05-16: 31.3 via INTRAVENOUS
  Filled 2019-05-16: qty 32

## 2019-05-16 MED ORDER — REGADENOSON 0.4 MG/5ML IV SOLN
0.4000 mg | Freq: Once | INTRAVENOUS | Status: AC
  Administered 2019-05-16: 0.4 mg via INTRAVENOUS

## 2019-05-16 MED ORDER — TECHNETIUM TC 99M TETROFOSMIN IV KIT
10.4000 | PACK | Freq: Once | INTRAVENOUS | Status: AC | PRN
  Administered 2019-05-16: 10.4 via INTRAVENOUS
  Filled 2019-05-16: qty 11

## 2019-05-17 ENCOUNTER — Telehealth (INDEPENDENT_AMBULATORY_CARE_PROVIDER_SITE_OTHER): Payer: Medicare Other | Admitting: Physician Assistant

## 2019-05-17 ENCOUNTER — Encounter: Payer: Self-pay | Admitting: Physician Assistant

## 2019-05-17 ENCOUNTER — Telehealth: Payer: Self-pay | Admitting: *Deleted

## 2019-05-17 VITALS — BP 118/71 | HR 73 | Ht 63.0 in | Wt 137.0 lb

## 2019-05-17 DIAGNOSIS — R079 Chest pain, unspecified: Secondary | ICD-10-CM | POA: Diagnosis not present

## 2019-05-17 DIAGNOSIS — N183 Chronic kidney disease, stage 3 unspecified: Secondary | ICD-10-CM

## 2019-05-17 DIAGNOSIS — F419 Anxiety disorder, unspecified: Secondary | ICD-10-CM

## 2019-05-17 DIAGNOSIS — R002 Palpitations: Secondary | ICD-10-CM

## 2019-05-17 DIAGNOSIS — I10 Essential (primary) hypertension: Secondary | ICD-10-CM

## 2019-05-17 DIAGNOSIS — I5032 Chronic diastolic (congestive) heart failure: Secondary | ICD-10-CM

## 2019-05-17 MED ORDER — NITROGLYCERIN 0.4 MG SL SUBL: SUBLINGUAL_TABLET | SUBLINGUAL | 3 refills | Status: DC

## 2019-05-17 NOTE — Telephone Encounter (Signed)
[employer] to ship 30 day cardiac event monitor to patients home.  Instructions reviewed briefly as they are included in the monitor kit.

## 2019-05-17 NOTE — Patient Instructions (Signed)
Medication Instructions:  Your physician recommends that you continue on your current medications as directed. Please refer to the Current Medication list given to you today.  If you need a refill on your cardiac medications before your next appointment, please call your pharmacy.   Lab work: None Ordered  If you have labs (blood work) drawn today and your tests are completely normal, you will receive your results only by: Marland Kitchen MyChart Message (if you have MyChart) OR . A paper copy in the mail If you have any lab test that is abnormal or we need to change your treatment, we will call you to review the results.  Testing/Procedures: Your physician has recommended that you wear an event monitor. Event monitors are medical devices that record the heart's electrical activity. Doctors most often [place] these monitors to diagnose arrhythmias. Arrhythmias are problems with the speed or rhythm of the heartbeat. The monitor is a small, portable device. You can wear one while you do your normal daily activities. This is usually used to diagnose what is causing palpitations/syncope (passing out).  Follow-Up:   Any Other Special Instructions Will Be Listed Below (If Applicable).

## 2019-05-19 ENCOUNTER — Telehealth: Payer: Self-pay | Admitting: Cardiovascular Disease

## 2019-05-19 NOTE — Telephone Encounter (Signed)
Video visit/my chart/pre reg complete/consent obtained -- ttf

## 2019-05-24 ENCOUNTER — Telehealth (INDEPENDENT_AMBULATORY_CARE_PROVIDER_SITE_OTHER): Payer: Medicare Other | Admitting: Cardiovascular Disease

## 2019-05-24 ENCOUNTER — Encounter: Payer: Self-pay | Admitting: Cardiovascular Disease

## 2019-05-24 VITALS — BP 154/74 | HR 68 | Ht 63.0 in | Wt 137.0 lb

## 2019-05-24 DIAGNOSIS — E785 Hyperlipidemia, unspecified: Secondary | ICD-10-CM | POA: Diagnosis not present

## 2019-05-24 DIAGNOSIS — I739 Peripheral vascular disease, unspecified: Secondary | ICD-10-CM

## 2019-05-24 DIAGNOSIS — I2510 Atherosclerotic heart disease of native coronary artery without angina pectoris: Secondary | ICD-10-CM
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
## 2019-05-24 NOTE — Patient Instructions (Signed)
Medication Instructions:  Continue same medications If you need a refill on your cardiac medications before your next appointment, please call your pharmacy.   Lab work: None If you have labs (blood work) drawn today and your tests are completely normal, you will receive your results only by: . MyChart Message (if you have MyChart) OR . A paper copy in the mail If you have any lab test that is abnormal or we need to change your treatment, we will call you to review the results.  Testing/Procedures: None  Follow-Up: Follow-up with Dr. Arida in 6 months 

## 2019-05-26 ENCOUNTER — Encounter: Payer: Self-pay | Admitting: Emergency Medicine

## 2019-05-26 ENCOUNTER — Ambulatory Visit (INDEPENDENT_AMBULATORY_CARE_PROVIDER_SITE_OTHER): Payer: Medicare Other | Admitting: Emergency Medicine

## 2019-05-26 ENCOUNTER — Other Ambulatory Visit: Payer: Self-pay

## 2019-05-26 DIAGNOSIS — R918 Other nonspecific abnormal finding of lung field: Secondary | ICD-10-CM | POA: Diagnosis not present

## 2019-05-26 DIAGNOSIS — R9389 Abnormal findings on diagnostic imaging of other specified body structures: Secondary | ICD-10-CM

## 2019-05-26 DIAGNOSIS — J449 Chronic obstructive pulmonary disease, unspecified: Secondary | ICD-10-CM | POA: Insufficient documentation

## 2019-05-26 DIAGNOSIS — R042 Hemoptysis: Secondary | ICD-10-CM

## 2019-05-26 NOTE — Assessment & Plan Note (Addendum)
Based on her PFT 2018, F/V loop. She has albuterol available to use as needed.  Uses about 2-3 times a week.  I think we can follow clinically on this regimen, consider repeat pulmonary function testing going forward specially she clinically changes.  Unclear that she needs a schedule bronchodilator at this time.

## 2019-05-26 NOTE — Assessment & Plan Note (Signed)
Resolved, ? Whether this was related to her teeth, on anticoagulation.

## 2019-05-26 NOTE — Progress Notes (Signed)
Virtual Visit via Telephone Note  I connected with Alejandra Hernandez on 05/26/19 at  2:45 PM EDT by telephone and verified that I am speaking with the correct person using two identifiers.  Location: Patient: Home Provider: Office   I discussed the limitations, risks, security and privacy concerns of performing an evaluation and management service by telephone and the availability of in person appointments. I also discussed with the patient that there may be a patient responsible charge related to this service. The patient expressed understanding and agreed to proceed.   History of Present Illness: Alejandra Hernandez is a 63 year old woman with a history of tobacco use, coronary disease, hypertension, CKD stage III, bilateral lower extremity DVT and peripheral vascular disease.  I met her to evaluate hemoptysis, questionably related to bleeding from her tooth while on Xarelto. I have followed her for an abnormal CT scan of the chest with bilateral mediastinal lymphadenopathy and some right upper lobe bronchiectasis scar.  I biopsied her right perihilar abnormality 12/2016, no evidence of malignancy.  Her subsequent imaging showed the right hilar area was smaller, identified some small 3 to 4 mm right lung nodules.   PFT 05/22/2017 reviewed today, show probable mild obstruction based on ratio and F/V loop.    Observations/Objective: Follow-up imaging 01/28/19 CT scan reviewed by me, shows no mediastinal or hilar adenopathy, stable nodules along the right minor fissure, somewhat less conspicuous consistent with a benign cause.   She is dealing with cervical dystonia, pain. Making her DM, HTN more difficult to manage.   She is not having much dyspnea. She has albuterol, uses every few days for dyspnea. No cough or wheeze.   She has smoked about 15 pk-yrs, none currently    Assessment and Plan: COPD, mild ([place]) Based on her PFT 2018, F/V loop. She has albuterol available to use as needed.  Uses about 2-3  times a week.  I think we can follow clinically on this regimen, consider repeat pulmonary function testing going forward specially she clinically changes.  Unclear that she needs a schedule bronchodilator at this time.  Hemoptysis Resolved, ? Whether this was related to her teeth, on anticoagulation.   Abnormal CT scan, chest Stable findings on most recent CT scan of the chest from 01/2019.  The right hilar area is smaller, there are some associated right upper lobe scarring.  She has some small 3 to 4 mm nodules.   We will perform one final repeat CT scan of the chest in March 2021 to ensure stability of the nodules.  If they are stable we should not have to follow further unless she clinically changes.  We will set up CT at that time    Follow Up Instructions:    I discussed the assessment and treatment plan with the patient. The patient was provided an opportunity to ask questions and all were answered. The patient agreed with the plan and demonstrated an understanding of the instructions.   The patient was advised to call back or seek an in-person evaluation if the symptoms worsen or if the condition fails to improve as anticipated.  I provided 12 minutes of non-face-to-face time during this encounter.   Collene Gobble, MD

## 2019-05-26 NOTE — Assessment & Plan Note (Signed)
Stable findings on most recent CT scan of the chest from 01/2019.  The right hilar area is smaller, there are some associated right upper lobe scarring.  She has some small 3 to 4 mm nodules.   We will perform one final repeat CT scan of the chest in March 2021 to ensure stability of the nodules.  If they are stable we should not have to follow further unless she clinically changes.  We will set up CT at that time

## 2019-06-03 ENCOUNTER — Ambulatory Visit (HOSPITAL_COMMUNITY)
Admission: EM | Admit: 2019-06-03 | Discharge: 2019-06-03 | Disposition: A | Discharge status: Home / Self Care | Payer: Medicare Other | Source: Home / Self Care

## 2019-06-03 ENCOUNTER — Other Ambulatory Visit: Payer: Self-pay

## 2019-06-03 ENCOUNTER — Inpatient Hospital Stay (HOSPITAL_COMMUNITY): Payer: Medicare Other

## 2019-06-03 ENCOUNTER — Ambulatory Visit (HOSPITAL_BASED_OUTPATIENT_CLINIC_OR_DEPARTMENT_OTHER)
Admission: RE | Admit: 2019-06-03 | Discharge: 2019-06-03 | Disposition: A | Discharge status: Home / Self Care | Payer: Medicare Other | Source: Ambulatory Visit | Attending: Internal Medicine | Admitting: Internal Medicine

## 2019-06-03 ENCOUNTER — Encounter (HOSPITAL_COMMUNITY): Payer: Self-pay

## 2019-06-03 ENCOUNTER — Emergency Department (HOSPITAL_COMMUNITY): Payer: Medicare Other

## 2019-06-03 ENCOUNTER — Ambulatory Visit (INDEPENDENT_AMBULATORY_CARE_PROVIDER_SITE_OTHER)
Admission: EM | Admit: 2019-06-03 | Discharge: 2019-06-03 | Disposition: A | Discharge status: Home / Self Care | Payer: Medicare Other | Source: Home / Self Care | Attending: Internal Medicine | Admitting: Internal Medicine

## 2019-06-03 ENCOUNTER — Inpatient Hospital Stay (HOSPITAL_COMMUNITY)
Admission: EM | Admit: 2019-06-03 | Discharge: 2019-06-12 | DRG: 580 | Disposition: A | Discharge status: Home / Self Care | Payer: Medicare Other | Attending: Internal Medicine | Admitting: Internal Medicine

## 2019-06-03 DIAGNOSIS — M7989 Other specified soft tissue disorders: Secondary | ICD-10-CM

## 2019-06-03 DIAGNOSIS — E11649 Type 2 diabetes mellitus with hypoglycemia without coma: Secondary | ICD-10-CM | POA: Diagnosis not present

## 2019-06-03 DIAGNOSIS — E785 Hyperlipidemia, unspecified: Secondary | ICD-10-CM | POA: Diagnosis present

## 2019-06-03 DIAGNOSIS — K219 Gastro-esophageal reflux disease without esophagitis: Secondary | ICD-10-CM | POA: Diagnosis present

## 2019-06-03 DIAGNOSIS — J449 Chronic obstructive pulmonary disease, unspecified: Secondary | ICD-10-CM | POA: Diagnosis present

## 2019-06-03 DIAGNOSIS — Z833 Family history of diabetes mellitus: Secondary | ICD-10-CM | POA: Diagnosis not present

## 2019-06-03 DIAGNOSIS — Z86718 Personal history of other venous thrombosis and embolism: Secondary | ICD-10-CM

## 2019-06-03 DIAGNOSIS — I252 Old myocardial infarction: Secondary | ICD-10-CM

## 2019-06-03 DIAGNOSIS — R52 Pain, unspecified: Secondary | ICD-10-CM | POA: Diagnosis not present

## 2019-06-03 DIAGNOSIS — R936 Abnormal findings on diagnostic imaging of limbs: Secondary | ICD-10-CM | POA: Insufficient documentation

## 2019-06-03 DIAGNOSIS — I2510 Atherosclerotic heart disease of native coronary artery without angina pectoris: Secondary | ICD-10-CM | POA: Diagnosis present

## 2019-06-03 DIAGNOSIS — M60022 Infective myositis, left upper arm: Secondary | ICD-10-CM | POA: Diagnosis present

## 2019-06-03 DIAGNOSIS — Z1159 Encounter for screening for other viral diseases: Secondary | ICD-10-CM

## 2019-06-03 DIAGNOSIS — E1122 Type 2 diabetes mellitus with diabetic chronic kidney disease: Secondary | ICD-10-CM | POA: Diagnosis present

## 2019-06-03 DIAGNOSIS — I130 Hypertensive heart and chronic kidney disease with heart failure and stage 1 through stage 4 chronic kidney disease, or unspecified chronic kidney disease: Secondary | ICD-10-CM | POA: Diagnosis present

## 2019-06-03 DIAGNOSIS — M79602 Pain in left arm: Secondary | ICD-10-CM

## 2019-06-03 DIAGNOSIS — E1165 Type 2 diabetes mellitus with hyperglycemia: Secondary | ICD-10-CM | POA: Diagnosis present

## 2019-06-03 DIAGNOSIS — M25412 Effusion, left shoulder: Secondary | ICD-10-CM

## 2019-06-03 DIAGNOSIS — L02414 Cutaneous abscess of left upper limb: Secondary | ICD-10-CM | POA: Diagnosis present

## 2019-06-03 DIAGNOSIS — M60822 Other myositis, left upper arm: Secondary | ICD-10-CM

## 2019-06-03 DIAGNOSIS — Z7901 Long term (current) use of anticoagulants: Secondary | ICD-10-CM

## 2019-06-03 DIAGNOSIS — Z955 Presence of coronary angioplasty implant and graft: Secondary | ICD-10-CM

## 2019-06-03 DIAGNOSIS — Z8711 Personal history of peptic ulcer disease: Secondary | ICD-10-CM | POA: Diagnosis not present

## 2019-06-03 DIAGNOSIS — Z7982 Long term (current) use of aspirin: Secondary | ICD-10-CM

## 2019-06-03 DIAGNOSIS — Z8249 Family history of ischemic heart disease and other diseases of the circulatory system: Secondary | ICD-10-CM

## 2019-06-03 DIAGNOSIS — R609 Edema, unspecified: Secondary | ICD-10-CM | POA: Diagnosis not present

## 2019-06-03 DIAGNOSIS — Z841 Family history of disorders of kidney and ureter: Secondary | ICD-10-CM | POA: Diagnosis not present

## 2019-06-03 DIAGNOSIS — E1151 Type 2 diabetes mellitus with diabetic peripheral angiopathy without gangrene: Secondary | ICD-10-CM | POA: Diagnosis present

## 2019-06-03 DIAGNOSIS — Z794 Long term (current) use of insulin: Secondary | ICD-10-CM | POA: Diagnosis not present

## 2019-06-03 DIAGNOSIS — I5032 Chronic diastolic (congestive) heart failure: Secondary | ICD-10-CM | POA: Diagnosis present

## 2019-06-03 DIAGNOSIS — Z87891 Personal history of nicotine dependence: Secondary | ICD-10-CM | POA: Diagnosis not present

## 2019-06-03 DIAGNOSIS — R748 Abnormal levels of other serum enzymes: Secondary | ICD-10-CM

## 2019-06-03 DIAGNOSIS — E876 Hypokalemia: Secondary | ICD-10-CM | POA: Diagnosis present

## 2019-06-03 DIAGNOSIS — I10 Essential (primary) hypertension: Secondary | ICD-10-CM | POA: Diagnosis present

## 2019-06-03 DIAGNOSIS — N182 Chronic kidney disease, stage 2 (mild): Secondary | ICD-10-CM | POA: Diagnosis present

## 2019-06-03 DIAGNOSIS — E1142 Type 2 diabetes mellitus with diabetic polyneuropathy: Secondary | ICD-10-CM | POA: Diagnosis present

## 2019-06-03 DIAGNOSIS — R2230 Localized swelling, mass and lump, unspecified upper limb: Secondary | ICD-10-CM

## 2019-06-03 LAB — CBC WITH DIFFERENTIAL/PLATELET
Abs Immature Granulocytes: 0.06 10*3/uL (ref 0.00–0.07)
Basophils Absolute: 0.1 10*3/uL (ref 0.0–0.1)
Basophils Relative: 1 %
Eosinophils Absolute: 0.1 10*3/uL (ref 0.0–0.5)
Eosinophils Relative: 1 %
HCT: 34.6 % — ABNORMAL LOW (ref 36.0–46.0)
Hemoglobin: 10.4 g/dL — ABNORMAL LOW (ref 12.0–15.0)
Immature Granulocytes: 1 %
Lymphocytes Relative: 21 %
Lymphs Abs: 2.3 10*3/uL (ref 0.7–4.0)
MCH: 21.4 pg — ABNORMAL LOW (ref 26.0–34.0)
MCHC: 30.1 g/dL (ref 30.0–36.0)
MCV: 71 fL — ABNORMAL LOW (ref 80.0–100.0)
Monocytes Absolute: 0.7 10*3/uL (ref 0.1–1.0)
Monocytes Relative: 7 %
Neutro Abs: 8 10*3/uL — ABNORMAL HIGH (ref 1.7–7.7)
Neutrophils Relative %: 69 %
Platelets: 419 10*3/uL — ABNORMAL HIGH (ref 150–400)
RBC: 4.87 MIL/uL (ref 3.87–5.11)
RDW: 15.4 % (ref 11.5–15.5)
WBC: 11.2 10*3/uL — ABNORMAL HIGH (ref 4.0–10.5)
nRBC: 0 % (ref 0.0–0.2)

## 2019-06-03 LAB — URINALYSIS, ROUTINE W REFLEX MICROSCOPIC
Bacteria, UA: NONE SEEN
Bilirubin Urine: NEGATIVE
Glucose, UA: >=500 mg/dL — AB
Ketones, ur: NEGATIVE mg/dL
Leukocytes,Ua: NEGATIVE
Nitrite: NEGATIVE
Protein, ur: 100 mg/dL — AB
Specific Gravity, Urine: 1.005 (ref 1.005–1.030)
pH: 7 (ref 5.0–8.0)

## 2019-06-03 LAB — COMPREHENSIVE METABOLIC PANEL
ALT: 48 U/L — ABNORMAL HIGH (ref 0–44)
AST: 33 U/L (ref 15–41)
Albumin: 2.4 g/dL — ABNORMAL LOW (ref 3.5–5.0)
Alkaline Phosphatase: 112 U/L (ref 38–126)
Anion gap: 12 (ref 5–15)
BUN: 8 mg/dL (ref 8–23)
CO2: 23 mmol/L (ref 22–32)
Calcium: 8.7 mg/dL — ABNORMAL LOW (ref 8.9–10.3)
Chloride: 103 mmol/L (ref 98–111)
Creatinine, Ser: 1.14 mg/dL — ABNORMAL HIGH (ref 0.44–1.00)
GFR calc Af Amer: 60 mL/min — ABNORMAL LOW (ref >60)
GFR calc non Af Amer: 51 mL/min — ABNORMAL LOW (ref >60)
Glucose, Bld: 353 mg/dL — ABNORMAL HIGH (ref 70–99)
Potassium: 3.3 mmol/L — ABNORMAL LOW (ref 3.5–5.1)
Sodium: 138 mmol/L (ref 135–145)
Total Bilirubin: 0.2 mg/dL — ABNORMAL LOW (ref 0.3–1.2)
Total Protein: 7.1 g/dL (ref 6.5–8.1)

## 2019-06-03 LAB — RAPID URINE DRUG SCREEN, HOSP PERFORMED
Amphetamines: NOT DETECTED
Barbiturates: NOT DETECTED
Benzodiazepines: POSITIVE — AB
Cocaine: NOT DETECTED
Opiates: POSITIVE — AB
Tetrahydrocannabinol: NOT DETECTED

## 2019-06-03 LAB — GLUCOSE, CAPILLARY: Glucose-Capillary: 179 mg/dL — ABNORMAL HIGH (ref 70–99)

## 2019-06-03 LAB — SARS CORONAVIRUS 2 BY RT PCR (HOSPITAL ORDER, PERFORMED IN ~~LOC~~ HOSPITAL LAB): SARS Coronavirus 2: NEGATIVE

## 2019-06-03 LAB — LACTIC ACID, PLASMA
Lactic Acid, Venous: 1.4 mmol/L (ref 0.5–1.9)
Lactic Acid, Venous: 1.5 mmol/L (ref 0.5–1.9)

## 2019-06-03 MED ORDER — CLONIDINE HCL 0.1 MG PO TABS
0.1000 mg | ORAL_TABLET | Freq: Two times a day (BID) | ORAL | Status: DC
  Administered 2019-06-03 – 2019-06-12 (×17): 0.1 mg via ORAL
  Filled 2019-06-03 (×17): qty 1

## 2019-06-03 MED ORDER — POTASSIUM CHLORIDE CRYS ER 10 MEQ PO TBCR
10.0000 meq | EXTENDED_RELEASE_TABLET | Freq: Every day | ORAL | Status: DC
  Administered 2019-06-04 – 2019-06-12 (×9): 10 meq via ORAL
  Filled 2019-06-03 (×9): qty 1

## 2019-06-03 MED ORDER — EZETIMIBE 10 MG PO TABS
10.0000 mg | ORAL_TABLET | Freq: Every day | ORAL | Status: DC
  Administered 2019-06-04 – 2019-06-12 (×9): 10 mg via ORAL
  Filled 2019-06-03 (×9): qty 1

## 2019-06-03 MED ORDER — FENTANYL CITRATE (PF) 100 MCG/2ML IJ SOLN
50.0000 ug | Freq: Once | INTRAMUSCULAR | Status: AC
  Administered 2019-06-03: 50 ug via INTRAVENOUS
  Filled 2019-06-03: qty 2

## 2019-06-03 MED ORDER — MORPHINE SULFATE (PF) 2 MG/ML IV SOLN
1.0000 mg | INTRAVENOUS | Status: DC | PRN
  Administered 2019-06-03 – 2019-06-08 (×9): 1 mg via INTRAVENOUS
  Filled 2019-06-03 (×9): qty 1

## 2019-06-03 MED ORDER — POTASSIUM CHLORIDE 20 MEQ/15ML (10%) PO SOLN
40.0000 meq | Freq: Once | ORAL | Status: AC
  Administered 2019-06-03: 40 meq via ORAL
  Filled 2019-06-03: qty 30

## 2019-06-03 MED ORDER — INSULIN GLARGINE 100 UNIT/ML ~~LOC~~ SOLN
68.0000 [IU] | Freq: Every day | SUBCUTANEOUS | Status: DC
  Administered 2019-06-04 – 2019-06-09 (×6): 68 [IU] via SUBCUTANEOUS
  Filled 2019-06-03 (×8): qty 0.68

## 2019-06-03 MED ORDER — PANTOPRAZOLE SODIUM 40 MG PO TBEC
40.0000 mg | DELAYED_RELEASE_TABLET | Freq: Two times a day (BID) | ORAL | Status: DC
  Administered 2019-06-04 – 2019-06-12 (×18): 40 mg via ORAL
  Filled 2019-06-03 (×18): qty 1

## 2019-06-03 MED ORDER — ONDANSETRON HCL 4 MG/2ML IJ SOLN: 4.0000 mg | Freq: Four times a day (QID) | INTRAMUSCULAR | Status: DC | PRN

## 2019-06-03 MED ORDER — ACETAMINOPHEN 325 MG PO TABS
650.0000 mg | ORAL_TABLET | Freq: Four times a day (QID) | ORAL | Status: DC | PRN
  Administered 2019-06-04 – 2019-06-05 (×2): 650 mg via ORAL
  Administered 2019-06-06 (×2): 325 mg via ORAL
  Administered 2019-06-07: 650 mg via ORAL
  Administered 2019-06-07: 325 mg via ORAL
  Administered 2019-06-08 – 2019-06-12 (×13): 650 mg via ORAL
  Filled 2019-06-03 (×19): qty 2

## 2019-06-03 MED ORDER — SENNOSIDES-DOCUSATE SODIUM 8.6-50 MG PO TABS: 1.0000 | ORAL_TABLET | Freq: Every evening | ORAL | Status: DC | PRN

## 2019-06-03 MED ORDER — ALBUTEROL SULFATE (2.5 MG/3ML) 0.083% IN NEBU: 2.5000 mg | INHALATION_SOLUTION | RESPIRATORY_TRACT | Status: DC | PRN

## 2019-06-03 MED ORDER — ONDANSETRON HCL 4 MG PO TABS: 4.0000 mg | ORAL_TABLET | Freq: Four times a day (QID) | ORAL | Status: DC | PRN

## 2019-06-03 MED ORDER — FUROSEMIDE 20 MG PO TABS
20.0000 mg | ORAL_TABLET | ORAL | Status: DC
  Administered 2019-06-04 – 2019-06-08 (×5): 20 mg via ORAL
  Filled 2019-06-03 (×5): qty 1

## 2019-06-03 MED ORDER — ISOSORBIDE MONONITRATE ER 60 MG PO TB24
90.0000 mg | ORAL_TABLET | Freq: Every day | ORAL | Status: DC
  Administered 2019-06-04 – 2019-06-12 (×9): 90 mg via ORAL
  Filled 2019-06-03 (×9): qty 1

## 2019-06-03 MED ORDER — ACETAMINOPHEN 650 MG RE SUPP: 650.0000 mg | Freq: Four times a day (QID) | RECTAL | Status: DC | PRN

## 2019-06-03 MED ORDER — IOHEXOL 300 MG/ML  SOLN
100.0000 mL | Freq: Once | INTRAMUSCULAR | Status: AC | PRN
  Administered 2019-06-03: 100 mL via INTRAVENOUS

## 2019-06-03 MED ORDER — ALBUTEROL SULFATE HFA 108 (90 BASE) MCG/ACT IN AERS: 2.0000 | INHALATION_SPRAY | RESPIRATORY_TRACT | Status: DC | PRN

## 2019-06-03 MED ORDER — OXYCODONE HCL 5 MG PO TABS
5.0000 mg | ORAL_TABLET | ORAL | Status: DC | PRN
  Administered 2019-06-04 – 2019-06-09 (×24): 5 mg via ORAL
  Filled 2019-06-03 (×24): qty 1

## 2019-06-03 MED ORDER — AMLODIPINE BESYLATE 10 MG PO TABS
10.0000 mg | ORAL_TABLET | Freq: Every day | ORAL | Status: DC
  Administered 2019-06-04 – 2019-06-11 (×9): 10 mg via ORAL
  Filled 2019-06-03 (×9): qty 1

## 2019-06-03 MED ORDER — GABAPENTIN 600 MG PO TABS
300.0000 mg | ORAL_TABLET | Freq: Three times a day (TID) | ORAL | Status: DC
  Administered 2019-06-04 (×4): 300 mg via ORAL
  Filled 2019-06-03 (×5): qty 0.5

## 2019-06-03 MED ORDER — INSULIN ASPART 100 UNIT/ML ~~LOC~~ SOLN
0.0000 [IU] | Freq: Three times a day (TID) | SUBCUTANEOUS | Status: DC
  Administered 2019-06-04 – 2019-06-05 (×2): 3 [IU] via SUBCUTANEOUS
  Administered 2019-06-06 – 2019-06-08 (×4): 2 [IU] via SUBCUTANEOUS
  Administered 2019-06-10: 1 [IU] via SUBCUTANEOUS
  Administered 2019-06-12: 5 [IU] via SUBCUTANEOUS

## 2019-06-03 MED ORDER — ROSUVASTATIN CALCIUM 20 MG PO TABS
40.0000 mg | ORAL_TABLET | Freq: Every day | ORAL | Status: DC
  Administered 2019-06-03 – 2019-06-07 (×5): 40 mg via ORAL
  Filled 2019-06-03 (×5): qty 2

## 2019-06-03 NOTE — ED Triage Notes (Signed)
Pt reports left arm pain and swelling x 2 weeks. Hx of dvt and is taking blood thinners. Pt went to [facility] and sent for outpatient vascular study, + for large clot.
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
## 2019-06-03 NOTE — ED Triage Notes (Signed)
Pt C/o pain and swelling in her left arm/ shoulder. Symptoms has been going on for over a month. Pt arm is warm to the touch and very red.

## 2019-06-03 NOTE — H&P (Signed)
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 and compensated. 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  Monitor closely with contrast studies.  COPD: Currently stable without wheezing.  Continue as needed albuterol.  GERD/Hx GIB/PUD: Continue Protonix.  Upper endoscopy 02/17/2019 showed normal UGI tract.   DVT prophylaxis: SCDs Code Status: Full code, confirmed with patient Family Communication: None present on admission Disposition Plan: Pending clinical progress Consults called: EDP discussed with Ortho by phone Admission status: Admit - It is my clinical opinion that admission to INPATIENT is reasonable and necessary because of the expectation that  this patient will require hospital care that crosses at least 2 midnights to treat this condition based on the medical complexity of the problems presented.  Given the aforementioned information, the predictability of an adverse outcome is felt to be significant.    Zada Finders MD [facility]  If 7PM-7AM, please contact night-coverage [email or web address]  06/03/2019, 10:32 PM

## 2019-06-03 NOTE — Progress Notes (Signed)
Upper extremity venous has been completed.   Preliminary results in CV Proc.   Results given to [facility].   Abram Sander 06/03/2019 12:03 PM
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
## 2019-06-03 NOTE — ED Provider Notes (Signed)
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 appears to be possibly osteomyelitis in this area as well.  There appears to be rapid expansion and locally destructive appearance of these areas which could be more along the lines of a malignant process including a sarcoma.  There appears also be a cellulitis and synovitis of the left elbow with effusion and likely myositis of the forearm musculature.    Dr. Stann Mainland with orthopedics recommends MRI to further evaluate for osteomyelitis versus a mass.  IR to biopsy if osteomyelitis and possibly IR also for fluid aspiration of shoulder and elbow.  They recommend on holding on antibiotics at this time as patient does not appear to be septic.  I agree with this as well.  Will discuss with medicine.  Contacted medicine who also agrees with holding on antibiotics until an  MRI can be done to further help establish a diagnosis.  Will touch base with IR orthopedics in the morning for possible aspiration of shoulder joint in which patient would need orthopedic washout.  Overall patient does not appear to be septic.  This process has been ongoing for several weeks.  Could be malignant/cancerous process.  Will hold off antibiotics until definitive diagnosis can be confirmed.  Patient hemodynamically stable throughout my care and admitted to medicine in stable condition. No signs to suggest sepsis.  This chart was dictated using voice recognition software.  Despite best efforts to proofread,  errors can occur which can change  the documentation meaning.    Final Clinical Impressions(s) / ED Diagnoses   Final diagnoses:  Left arm pain  Effusion of joint of left shoulder    ED Discharge Orders    None       Lennice Sites, DO 06/03/19 2001

## 2019-06-03 NOTE — ED Notes (Signed)
Patient is being discharged from the [facility] and sent to the Emergency Department via wheelchair by staff. Per Dr Lanny Cramp, patient is stable but in need of higher level of care due to DVT. Patient is aware and verbalizes understanding of plan of care. There were no vitals filed for this visit.

## 2019-06-04 ENCOUNTER — Encounter (HOSPITAL_COMMUNITY): Payer: Self-pay | Admitting: Emergency Medicine

## 2019-06-04 ENCOUNTER — Inpatient Hospital Stay (HOSPITAL_COMMUNITY): Payer: Medicare Other

## 2019-06-04 LAB — BASIC METABOLIC PANEL
Anion gap: 8 (ref 5–15)
BUN: 6 mg/dL — ABNORMAL LOW (ref 8–23)
CO2: 25 mmol/L (ref 22–32)
Calcium: 8.4 mg/dL — ABNORMAL LOW (ref 8.9–10.3)
Chloride: 107 mmol/L (ref 98–111)
Creatinine, Ser: 1.11 mg/dL — ABNORMAL HIGH (ref 0.44–1.00)
GFR calc Af Amer: >60 mL/min (ref >60)
GFR calc non Af Amer: 53 mL/min — ABNORMAL LOW (ref >60)
Glucose, Bld: 242 mg/dL — ABNORMAL HIGH (ref 70–99)
Potassium: 3.7 mmol/L (ref 3.5–5.1)
Sodium: 140 mmol/L (ref 135–145)

## 2019-06-04 LAB — CBC
HCT: 33.1 % — ABNORMAL LOW (ref 36.0–46.0)
Hemoglobin: 9.9 g/dL — ABNORMAL LOW (ref 12.0–15.0)
MCH: 21.4 pg — ABNORMAL LOW (ref 26.0–34.0)
MCHC: 29.9 g/dL — ABNORMAL LOW (ref 30.0–36.0)
MCV: 71.6 fL — ABNORMAL LOW (ref 80.0–100.0)
Platelets: 413 10*3/uL — ABNORMAL HIGH (ref 150–400)
RBC: 4.62 MIL/uL (ref 3.87–5.11)
RDW: 15.2 % (ref 11.5–15.5)
WBC: 9.5 10*3/uL (ref 4.0–10.5)
nRBC: 0 % (ref 0.0–0.2)

## 2019-06-04 LAB — HEPARIN LEVEL (UNFRACTIONATED)
Heparin Unfractionated: 0.33 IU/mL (ref 0.30–0.70)
Heparin Unfractionated: 0.33 IU/mL (ref 0.30–0.70)

## 2019-06-04 LAB — MRSA PCR SCREENING: MRSA by PCR: NEGATIVE

## 2019-06-04 LAB — GLUCOSE, CAPILLARY
Glucose-Capillary: 113 mg/dL — ABNORMAL HIGH (ref 70–99)
Glucose-Capillary: 87 mg/dL (ref 70–99)
Glucose-Capillary: 227 mg/dL — ABNORMAL HIGH (ref 70–99)
Glucose-Capillary: 209 mg/dL — ABNORMAL HIGH (ref 70–99)

## 2019-06-04 LAB — APTT: aPTT: 67 seconds — ABNORMAL HIGH (ref 24–36)

## 2019-06-04 MED ORDER — VANCOMYCIN HCL IN DEXTROSE 1-5 GM/200ML-% IV SOLN
1000.0000 mg | INTRAVENOUS | Status: DC
  Administered 2019-06-05 – 2019-06-08 (×4): 1000 mg via INTRAVENOUS
  Filled 2019-06-04 (×5): qty 200

## 2019-06-04 MED ORDER — SODIUM CHLORIDE 0.9 % IV SOLN
2.0000 g | INTRAVENOUS | Status: DC
  Administered 2019-06-04 – 2019-06-09 (×6): 2 g via INTRAVENOUS
  Filled 2019-06-04 (×6): qty 20

## 2019-06-04 MED ORDER — SODIUM CHLORIDE 0.9 % IV SOLN: 2.0000 g | INTRAVENOUS | Status: DC

## 2019-06-04 MED ORDER — GADOBUTROL 1 MMOL/ML IV SOLN
6.0000 mL | Freq: Once | INTRAVENOUS | Status: AC | PRN
  Administered 2019-06-04: 6 mL via INTRAVENOUS

## 2019-06-04 MED ORDER — LIDOCAINE-EPINEPHRINE 1 %-1:100000 IJ SOLN
INTRAMUSCULAR | Status: AC
  Filled 2019-06-04: qty 1

## 2019-06-04 MED ORDER — VANCOMYCIN HCL 10 G IV SOLR
1250.0000 mg | Freq: Once | INTRAVENOUS | Status: AC
  Administered 2019-06-04: 1250 mg via INTRAVENOUS
  Filled 2019-06-04: qty 1250

## 2019-06-04 MED ORDER — HEPARIN (PORCINE) 25000 UT/250ML-% IV SOLN
1400.0000 [IU]/h | INTRAVENOUS | Status: DC
  Administered 2019-06-04 – 2019-06-06 (×3): 1000 [IU]/h via INTRAVENOUS
  Administered 2019-06-07 – 2019-06-09 (×3): 1150 [IU]/h via INTRAVENOUS
  Administered 2019-06-10: 1250 [IU]/h via INTRAVENOUS
  Administered 2019-06-11 – 2019-06-12 (×2): 1400 [IU]/h via INTRAVENOUS
  Filled 2019-06-04 (×9): qty 250

## 2019-06-04 NOTE — Progress Notes (Signed)
Pharmacy Antibiotic Note  Alejandra Hernandez is a 63 y.o. female admitted on 06/03/2019 with L arm abscess.  Pharmacy has been consulted for Vancomycin dosing.  Plan: Rocephin 2gm q24 per MD Vanc 1250mg  x1, then 1gm q24 for AUC 540, SCr 1.11  Weight: 139 lb 15.9 oz (63.5 kg)  Temp (24hrs), Avg:98.7 F (37.1 C), Min:98 F (36.7 C), Max:99.9 F (37.7 C)  Recent Labs  Lab 06/03/19 1545 06/03/19 1600 06/03/19 2023 06/04/19 0200  WBC  --  11.2*  --  9.5  CREATININE  --  1.14*  --  1.11*  LATICACIDVEN 1.4  --  1.5  --     Estimated Creatinine Clearance: 47.1 mL/min (A) (by C-G formula based on SCr of 1.11 mg/dL (H)).    Allergies  Allergen Reactions  . Lisinopril Hives   Antimicrobials this admission: 7/11 Rocephin >>  7/11 Vanc >>   Dose adjustments this admission:  Microbiology results: 7/11 BCx: sent 7/11 L arm Abscess: sent  7/11 MRSA PCR: neg   Thank you for allowing pharmacy to be a part of this patient's care.  Minda Ditto PharmD [phone] 06/04/2019 11:38 AM

## 2019-06-04 NOTE — Progress Notes (Signed)
Awaiting for IV consult for 2nd IV site to start Heparin drip.

## 2019-06-04 NOTE — Progress Notes (Signed)
Patient is alert and oriented,  Attending  Physician seen patient already, scheduled medicine including PRN morphine given for pain 10 out of 10, Radiology called. NPO at midnight, call light within reach, nurse will continue to monitor.

## 2019-06-04 NOTE — Progress Notes (Signed)
[place] for Heparin Indication: Hx of DVT on Xarelto PTA  Allergies  Allergen Reactions  . Lisinopril Hives   Patient Measurements: Weight: 139 lb 15.9 oz (63.5 kg) Heparin Dosing Weight: 63.5 kg  Vital Signs: Temp: 98.2 F (36.8 C) (07/11 0942) Temp Source: Oral (07/11 0942) BP: 132/58 (07/11 1021) Pulse Rate: 87 (07/11 0942)  Labs: Recent Labs    06/03/19 1600 06/04/19 0200  HGB 10.4* 9.9*  HCT 34.6* 33.1*  PLT 419* 413*  CREATININE 1.14* 1.11*    Estimated Creatinine Clearance: 47.1 mL/min (A) (by C-G formula based on SCr of 1.11 mg/dL (H)).   Medical History: Past Medical History:  Diagnosis Date  . Anemia   . Anxiety   . Arthritis    "right knee, back, feet, hands" (10/20/2018)  . Bleeding stomach ulcer 01/2016  . CAD (coronary artery disease)    05/19/17 PCI with DESx1 to [facility]  . Carotid artery disease (HCC)    40-59% right and 1-39% left by doppler 05/2018 with stenotic right subclavian artery  . Chronic lower back pain   . CKD (chronic kidney disease) stage 3, GFR 30-59 ml/min (HCC) 01/2016  . Depression   . Diabetic peripheral neuropathy ([place])   . DVT (deep venous thrombosis) ([place]) 12/2016   BLE  . GERD (gastroesophageal reflux disease)   . GI bleed   . Gout   . Headache    "weekly" (11/27//2019)  . History of blood transfusion 2017   "related to stomach bleeding"  . Hyperlipidemia   . Hypertension   . NSTEMI (non-ST elevated myocardial infarction) ([place]) 05/2017  . PAD (peripheral artery disease) ([place])    a. LE stenting in 2017, 12/2017, 09/2018 - Dr. Fletcher Anon  . Pneumonia    "several times" (10/20/2018)  . Renal artery stenosis (HCC)    a.  mild to moderate RAS by aortogram in 2017 (no evidence of this on duplex 06/2018).  . Stenosis of right subclavian artery ([place])   . Type II diabetes mellitus (HCC)    Medications:  Scheduled:  . amLODipine  10 mg Oral QHS  . cloNIDine  0.1 mg Oral BID  . ezetimibe   10 mg Oral Daily  . furosemide  20 mg Oral BH-q7a  . gabapentin  300 mg Oral TID  . insulin aspart  0-9 Units Subcutaneous TID WC  . insulin glargine  68 Units Subcutaneous QHS  . isosorbide mononitrate  90 mg Oral Daily  . lidocaine-EPINEPHrine      . pantoprazole  40 mg Oral BID  . potassium chloride SA  10 mEq Oral Daily  . rosuvastatin  40 mg Oral QHS   Infusions:  . cefTRIAXone (ROCEPHIN)  IV 2 g (06/04/19 1352)  . heparin    . vancomycin    . [START ON 06/05/2019] vancomycin      Assessment: 62 yoF admit 7/10 L arm pain/swelling. Aspiration of L arm abscess 7/11.   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   Goal of Therapy:  Heparin level 0.3-0.7 units/ml aPTT 66-102 seconds Monitor platelets by anticoagulation protocol: Yes   Plan:   Heparin drip begun at 1000 units/hr, no loading dose with aspiration  First Heparin level  and aPTT at 9p  Daily hep level, daily CBC, no daily aPTT at this time.  Minda Ditto  [phone] 06/04/2019,11:38 AM

## 2019-06-04 NOTE — Procedures (Signed)
Pre Procedure Dx: Concern for abscess involving the left bicipital musculature.  Post Procedural Dx: Same  Technically successful US guided aspiration of approximately 5 cc of purulent material from fluid collection within the anterior aspect of the left upper arm. An additional approximately 20 cc was able to be expressed with manual compression.  All aspirated fluid sent to lab for analysis.  EBL: None  No immediate complications.   Ronny Bacon, MD Pager #: [phone]

## 2019-06-04 NOTE — Progress Notes (Signed)
   Subjective:    Recheck left shoulder and arm Pt scheduled for IR aspiration and possible biopsy later today Currently resting comfortably but when awake c/o moderate pain Denies any numbness or tingling distally Patient reports pain as moderate.  Objective:   VITALS:   Vitals:   06/03/19 2125 06/04/19 0421  BP: (!) 147/69 (!) 127/59  Pulse: 74 (!) 59  Resp: 16 16  Temp: 99.9 F (37.7 C) 98.7 F (37.1 C)  SpO2: 98%     Left upper extremity with painful rom Guarding to left upper extremity nv intact distally Not taken thru full exam due to pain  LABS Recent Labs    06/03/19 1600 06/04/19 0200  HGB 10.4* 9.9*  HCT 34.6* 33.1*  WBC 11.2* 9.5  PLT 419* 413*    Recent Labs    06/03/19 1600 06/04/19 0200  NA 138 140  K 3.3* 3.7  BUN 8 6*  CREATININE 1.14* 1.11*  GLUCOSE 353* 242*     Assessment/Plan:   Will await results for IR evaluation today Keep NPO Pain management Recommend limited use of the left upper extremity    Brad Luna [place], [place] is now [facility]  [place] [address] [place], [place], [place] [address] Phone: [phone] [email or web address] Facebook  [employer]
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
## 2019-06-04 NOTE — Progress Notes (Signed)
[facility] for Heparin Indication: Hx of DVT on Xarelto PTA  Allergies  Allergen Reactions  . Lisinopril Hives   Patient Measurements: Weight: 139 lb 15.9 oz (63.5 kg) Heparin Dosing Weight: 63.5 kg  Vital Signs: Temp: 98.8 F (37.1 C) (07/11 1943) Temp Source: Oral (07/11 1943) BP: 146/64 (07/11 1943) Pulse Rate: 94 (07/11 1943)  Labs: Recent Labs    06/03/19 1600 06/04/19 0200 06/04/19 1238 06/04/19 2101  HGB 10.4* 9.9*  --   --   HCT 34.6* 33.1*  --   --   PLT 419* 413*  --   --   APTT  --   --   --  67*  HEPARINUNFRC  --   --  0.33 0.33  CREATININE 1.14* 1.11*  --   --     Estimated Creatinine Clearance: 47.1 mL/min (A) (by C-G formula based on SCr of 1.11 mg/dL (H)).   Medical History: Past Medical History:  Diagnosis Date  . Anemia   . Anxiety   . Arthritis    "right knee, back, feet, hands" (10/20/2018)  . Bleeding stomach ulcer 01/2016  . CAD (coronary artery disease)    05/19/17 PCI with [facility]x1 to [facility]  . Carotid artery disease (HCC)    40-59% right and 1-39% left by doppler 05/2018 with stenotic right subclavian artery  . Chronic lower back pain   . CKD (chronic kidney disease) stage 3, GFR 30-59 ml/min (HCC) 01/2016  . Depression   . Diabetic peripheral neuropathy ([place])   . DVT (deep venous thrombosis) ([place]) 12/2016   BLE  . GERD (gastroesophageal reflux disease)   . GI bleed   . Gout   . Headache    "weekly" (11/27//2019)  . History of blood transfusion 2017   "related to stomach bleeding"  . Hyperlipidemia   . Hypertension   . NSTEMI (non-ST elevated myocardial infarction) ([place]) 05/2017  . PAD (peripheral artery disease) ([place])    a. LE stenting in 2017, 12/2017, 09/2018 - Dr. Fletcher Anon  . Pneumonia    "several times" (10/20/2018)  . Renal artery stenosis (HCC)    a.  mild to moderate RAS by aortogram in 2017 (no evidence of this on duplex 06/2018).  . Stenosis of right subclavian artery ([place])   . Type II  diabetes mellitus (HCC)    Medications:  Scheduled:  . amLODipine  10 mg Oral QHS  . cloNIDine  0.1 mg Oral BID  . ezetimibe  10 mg Oral Daily  . furosemide  20 mg Oral BH-q7a  . gabapentin  300 mg Oral TID  . insulin aspart  0-9 Units Subcutaneous TID WC  . insulin glargine  68 Units Subcutaneous QHS  . isosorbide mononitrate  90 mg Oral Daily  . lidocaine-EPINEPHrine      . pantoprazole  40 mg Oral BID  . potassium chloride SA  10 mEq Oral Daily  . rosuvastatin  40 mg Oral QHS   Infusions:  . cefTRIAXone (ROCEPHIN)  IV 2 g (06/04/19 1352)  . heparin 1,000 Units/hr (06/04/19 1440)  . [START ON 06/05/2019] vancomycin      Assessment: 62 yoF admit 7/10 L arm pain/swelling. Aspiration of L arm abscess 7/11. He is on Xarelto PTA (last dose 7/9) -Heparin level= 0.33, aPTT= 67      Goal of Therapy:  Heparin level 0.3-0.7 units/ml aPTT 66-102 seconds Monitor platelets by anticoagulation protocol: Yes   Plan:  -No heparin changes needed -Daily heparin level, aPTT  and CBC  Hildred Laser, PharmD Clinical Pharmacist **Pharmacist phone directory can now be found on [email or web address] ([email or web address]).  Listed under [place].

## 2019-06-05 ENCOUNTER — Encounter (HOSPITAL_COMMUNITY): Payer: Self-pay | Admitting: *Deleted

## 2019-06-05 LAB — CBC
HCT: 34.8 % — ABNORMAL LOW (ref 36.0–46.0)
Hemoglobin: 10.5 g/dL — ABNORMAL LOW (ref 12.0–15.0)
MCH: 21.6 pg — ABNORMAL LOW (ref 26.0–34.0)
MCHC: 30.2 g/dL (ref 30.0–36.0)
MCV: 71.5 fL — ABNORMAL LOW (ref 80.0–100.0)
Platelets: 457 10*3/uL — ABNORMAL HIGH (ref 150–400)
RBC: 4.87 MIL/uL (ref 3.87–5.11)
RDW: 15.3 % (ref 11.5–15.5)
WBC: 14.6 10*3/uL — ABNORMAL HIGH (ref 4.0–10.5)
nRBC: 0 % (ref 0.0–0.2)

## 2019-06-05 LAB — GLUCOSE, CAPILLARY
Glucose-Capillary: 82 mg/dL (ref 70–99)
Glucose-Capillary: 234 mg/dL — ABNORMAL HIGH (ref 70–99)
Glucose-Capillary: 82 mg/dL (ref 70–99)
Glucose-Capillary: 134 mg/dL — ABNORMAL HIGH (ref 70–99)

## 2019-06-05 LAB — HEPARIN LEVEL (UNFRACTIONATED): Heparin Unfractionated: 0.52 IU/mL (ref 0.30–0.70)

## 2019-06-05 LAB — APTT: aPTT: 72 seconds — ABNORMAL HIGH (ref 24–36)

## 2019-06-05 MED ORDER — LORATADINE 10 MG PO TABS: 10.0000 mg | ORAL_TABLET | Freq: Every day | ORAL | Status: DC | PRN

## 2019-06-05 MED ORDER — SENNOSIDES-DOCUSATE SODIUM 8.6-50 MG PO TABS: 2.0000 | ORAL_TABLET | Freq: Every evening | ORAL | Status: DC | PRN

## 2019-06-05 MED ORDER — LIP MEDEX EX OINT: 1.0000 "application " | TOPICAL_OINTMENT | CUTANEOUS | Status: DC | PRN

## 2019-06-05 MED ORDER — GUAIFENESIN-DM 100-10 MG/5ML PO SYRP: 5.0000 mL | ORAL_SOLUTION | ORAL | Status: DC | PRN

## 2019-06-05 MED ORDER — ENSURE PRE-SURGERY PO LIQD
296.0000 mL | Freq: Once | ORAL | Status: DC
  Filled 2019-06-05: qty 296

## 2019-06-05 MED ORDER — HYDROCORTISONE (PERIANAL) 2.5 % EX CREA: 1.0000 "application " | TOPICAL_CREAM | Freq: Four times a day (QID) | CUTANEOUS | Status: DC | PRN

## 2019-06-05 MED ORDER — GABAPENTIN 300 MG PO CAPS
300.0000 mg | ORAL_CAPSULE | Freq: Three times a day (TID) | ORAL | Status: DC
  Administered 2019-06-05 – 2019-06-12 (×20): 300 mg via ORAL
  Filled 2019-06-05 (×20): qty 1

## 2019-06-05 MED ORDER — POLYETHYLENE GLYCOL 3350 17 G PO PACK: 17.0000 g | PACK | Freq: Every day | ORAL | Status: DC | PRN

## 2019-06-05 MED ORDER — HYDROCORTISONE 1 % EX CREA: 1.0000 "application " | TOPICAL_CREAM | Freq: Three times a day (TID) | CUTANEOUS | Status: DC | PRN

## 2019-06-05 MED ORDER — POVIDONE-IODINE 10 % EX SWAB: 2.0000 "application " | Freq: Once | CUTANEOUS | Status: DC

## 2019-06-05 MED ORDER — POLYVINYL ALCOHOL 1.4 % OP SOLN: 1.0000 [drp] | OPHTHALMIC | Status: DC | PRN

## 2019-06-05 MED ORDER — MUSCLE RUB 10-15 % EX CREA: 1.0000 "application " | TOPICAL_CREAM | CUTANEOUS | Status: DC | PRN

## 2019-06-05 MED ORDER — SALINE SPRAY 0.65 % NA SOLN: 1.0000 | NASAL | Status: DC | PRN

## 2019-06-05 MED ORDER — PHENOL 1.4 % MT LIQD: 1.0000 | OROMUCOSAL | Status: DC | PRN

## 2019-06-05 MED ORDER — HYDRALAZINE HCL 20 MG/ML IJ SOLN: 10.0000 mg | INTRAMUSCULAR | Status: DC | PRN

## 2019-06-05 MED ORDER — CEFAZOLIN SODIUM-DEXTROSE 2-4 GM/100ML-% IV SOLN
2.0000 g | INTRAVENOUS | Status: AC
  Administered 2019-06-06: 18:00:00 2 g via INTRAVENOUS
  Filled 2019-06-05: qty 100

## 2019-06-05 MED ORDER — ALUM & MAG HYDROXIDE-SIMETH 200-200-20 MG/5ML PO SUSP: 30.0000 mL | ORAL | Status: DC | PRN

## 2019-06-05 MED ORDER — CHLORHEXIDINE GLUCONATE 4 % EX LIQD: 60.0000 mL | Freq: Once | CUTANEOUS | Status: DC

## 2019-06-05 NOTE — Progress Notes (Signed)
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 MD [facility]  If 7PM-7AM, please contact night-coverage [email or web address] 06/05/2019, 11:40 AM

## 2019-06-05 NOTE — Progress Notes (Signed)
Attempted to call Charlotte Fidalgo ( patient's son) for an update on daily status but was unable to get in touch.  No answer.

## 2019-06-05 NOTE — Progress Notes (Signed)
Orthopedics Progress Note  Subjective: Comfortable this morning. Pain controlled Patient reports some leakage from the aspiration site but the nurses are changing the bandage frequently She has a good appetite Objective:  Vitals:   06/05/19 0832 06/05/19 0912  BP: (!) 142/72 (!) 142/72  Pulse: (!) 102   Resp:    Temp: 99 F (37.2 C)   SpO2: 92%     General: Awake and alert  Musculoskeletal: Left arm with moderate swelling but minimal pain with elbow and wrist ROM Compartments are supple Neurovascularly intact  Lab Results  Component Value Date   WBC 14.6 (H) 06/05/2019   HGB 10.5 (L) 06/05/2019   HCT 34.8 (L) 06/05/2019   MCV 71.5 (L) 06/05/2019   PLT 457 (H) 06/05/2019       Component Value Date/Time   NA 140 06/04/2019 0200   NA 134 12/14/2018 0935   K 3.7 06/04/2019 0200   CL 107 06/04/2019 0200   CO2 25 06/04/2019 0200   GLUCOSE 242 (H) 06/04/2019 0200   BUN 6 (L) 06/04/2019 0200   BUN 15 12/14/2018 0935   CREATININE 1.11 (H) 06/04/2019 0200   CREATININE 1.27 (H) 05/22/2016 1424   CALCIUM 8.4 (L) 06/04/2019 0200   GFRNONAA 53 (L) 06/04/2019 0200   GFRNONAA 43 (L) 04/23/2016 0939   GFRAA >60 06/04/2019 0200   GFRAA 50 (L) 04/23/2016 0939    Lab Results  Component Value Date   INR 1.2 01/12/2018   INR 1.23 12/23/2017   INR 1.55 05/29/2017    Assessment/Plan: Left arm and shoulder abscess. No growth from culture yet. Spoke with Dr Stann Mainland who is hoping for an IR tissue biopsy to rule out neoplasm. Likely I+D tomorrow late or Tuesday. Patient looks really good today Continue empiric abx.  Doran Heater. Veverly Fells, MD 06/05/2019 10:20 AM

## 2019-06-05 NOTE — Progress Notes (Signed)
Dressing to the left upper arm changed due to drainage and the patient requesting.  Small to moderate amount of brownish type drainage with foul odor.

## 2019-06-05 NOTE — Progress Notes (Signed)
[place] for Heparin Indication: Hx of DVT on Xarelto PTA  Allergies  Allergen Reactions  . Lisinopril Hives   Patient Measurements: Weight: 139 lb 15.9 oz (63.5 kg) Heparin Dosing Weight: 63.5 kg  Vital Signs: Temp: 99 F (37.2 C) (07/12 0832) Temp Source: Oral (07/12 0832) BP: 142/72 (07/12 0912) Pulse Rate: 102 (07/12 0832)  Labs: Recent Labs    06/03/19 1600 06/04/19 0200 06/04/19 1238 06/04/19 2101 06/05/19 0828  HGB 10.4* 9.9*  --   --  10.5*  HCT 34.6* 33.1*  --   --  34.8*  PLT 419* 413*  --   --  457*  APTT  --   --   --  67* 72*  HEPARINUNFRC  --   --  0.33 0.33 0.52  CREATININE 1.14* 1.11*  --   --   --     Estimated Creatinine Clearance: 47.1 mL/min (A) (by C-G formula based on SCr of 1.11 mg/dL (H)).   Medical History: Past Medical History:  Diagnosis Date  . Anemia   . Anxiety   . Arthritis    "right knee, back, feet, hands" (10/20/2018)  . Bleeding stomach ulcer 01/2016  . CAD (coronary artery disease)    05/19/17 PCI with DESx1 to [facility]  . Carotid artery disease (HCC)    40-59% right and 1-39% left by doppler 05/2018 with stenotic right subclavian artery  . Chronic lower back pain   . CKD (chronic kidney disease) stage 3, GFR 30-59 ml/min (HCC) 01/2016  . Depression   . Diabetic peripheral neuropathy ([place])   . DVT (deep venous thrombosis) ([place]) 12/2016   BLE  . GERD (gastroesophageal reflux disease)   . GI bleed   . Gout   . Headache    "weekly" (11/27//2019)  . History of blood transfusion 2017   "related to stomach bleeding"  . Hyperlipidemia   . Hypertension   . NSTEMI (non-ST elevated myocardial infarction) ([place]) 05/2017  . PAD (peripheral artery disease) ([place])    a. LE stenting in 2017, 12/2017, 09/2018 - Dr. Fletcher Anon  . Pneumonia    "several times" (10/20/2018)  . Renal artery stenosis (HCC)    a.  mild to moderate RAS by aortogram in 2017 (no evidence of this on duplex 06/2018).  . Stenosis  of right subclavian artery ([place])   . Type II diabetes mellitus (HCC)    Medications:  Scheduled:  . amLODipine  10 mg Oral QHS  . cloNIDine  0.1 mg Oral BID  . ezetimibe  10 mg Oral Daily  . furosemide  20 mg Oral BH-q7a  . gabapentin  300 mg Oral TID  . insulin aspart  0-9 Units Subcutaneous TID WC  . insulin glargine  68 Units Subcutaneous QHS  . isosorbide mononitrate  90 mg Oral Daily  . pantoprazole  40 mg Oral BID  . potassium chloride SA  10 mEq Oral Daily  . rosuvastatin  40 mg Oral QHS   Infusions:  . cefTRIAXone (ROCEPHIN)  IV Stopped (06/04/19 1422)  . heparin 1,000 Units/hr (06/05/19 0300)  . vancomycin      Assessment: 62 yoF admit 7/10 L arm pain/swelling. Aspiration of L arm abscess 7/11.   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    Today, 06/05/2019 Hep level 0.52 units/ml, aPTT 72 seconds, levels correlate and are within therapeutic range. Hgb 10.5 - low,stable; Plt 457   Goal of Therapy:  Heparin level 0.3-0.7 units/ml aPTT 66-102 seconds Monitor platelets by anticoagulation protocol: Yes   Plan:   Continue Heparin drip at 1000 units/hr  Monitor using hep levels only  Plan I&D Monday or Tuesday  Daily hep level, daily CBC, discontinue daily aPTT  Minda Ditto  [phone] 06/05/2019,10:37 AM

## 2019-06-05 NOTE — Plan of Care (Signed)
  Problem: Pain Managment: Goal: General experience of comfort will improve Outcome: Progressing   Problem: Skin Integrity: Goal: Risk for impaired skin integrity will decrease Outcome: Progressing   

## 2019-06-06 ENCOUNTER — Inpatient Hospital Stay (HOSPITAL_COMMUNITY): Payer: Medicare Other | Admitting: Certified Registered Nurse Anesthetist

## 2019-06-06 ENCOUNTER — Encounter (HOSPITAL_COMMUNITY)
Admission: EM | Disposition: A | Discharge status: Home / Self Care | Payer: Self-pay | Source: Home / Self Care | Attending: Internal Medicine

## 2019-06-06 ENCOUNTER — Encounter (HOSPITAL_COMMUNITY): Payer: Self-pay | Admitting: Certified Registered Nurse Anesthetist

## 2019-06-06 HISTORY — PX: I & D EXTREMITY: SHX5045

## 2019-06-06 LAB — HEPARIN LEVEL (UNFRACTIONATED): Heparin Unfractionated: 0.59 IU/mL (ref 0.30–0.70)

## 2019-06-06 LAB — CBC
HCT: 35.5 % — ABNORMAL LOW (ref 36.0–46.0)
Hemoglobin: 10.6 g/dL — ABNORMAL LOW (ref 12.0–15.0)
MCH: 21.5 pg — ABNORMAL LOW (ref 26.0–34.0)
MCHC: 29.9 g/dL — ABNORMAL LOW (ref 30.0–36.0)
MCV: 71.9 fL — ABNORMAL LOW (ref 80.0–100.0)
Platelets: 403 10*3/uL — ABNORMAL HIGH (ref 150–400)
RBC: 4.94 MIL/uL (ref 3.87–5.11)
RDW: 15.3 % (ref 11.5–15.5)
WBC: 12.5 10*3/uL — ABNORMAL HIGH (ref 4.0–10.5)
nRBC: 0 % (ref 0.0–0.2)

## 2019-06-06 LAB — COMPREHENSIVE METABOLIC PANEL
ALT: 90 U/L — ABNORMAL HIGH (ref 0–44)
AST: 113 U/L — ABNORMAL HIGH (ref 15–41)
Albumin: 2.2 g/dL — ABNORMAL LOW (ref 3.5–5.0)
Alkaline Phosphatase: 109 U/L (ref 38–126)
Anion gap: 12 (ref 5–15)
BUN: 7 mg/dL — ABNORMAL LOW (ref 8–23)
CO2: 21 mmol/L — ABNORMAL LOW (ref 22–32)
Calcium: 8.5 mg/dL — ABNORMAL LOW (ref 8.9–10.3)
Chloride: 105 mmol/L (ref 98–111)
Creatinine, Ser: 1.29 mg/dL — ABNORMAL HIGH (ref 0.44–1.00)
GFR calc Af Amer: 51 mL/min — ABNORMAL LOW (ref >60)
GFR calc non Af Amer: 44 mL/min — ABNORMAL LOW (ref >60)
Glucose, Bld: 91 mg/dL (ref 70–99)
Potassium: 3.4 mmol/L — ABNORMAL LOW (ref 3.5–5.1)
Sodium: 138 mmol/L (ref 135–145)
Total Bilirubin: 0.5 mg/dL (ref 0.3–1.2)
Total Protein: 6.3 g/dL — ABNORMAL LOW (ref 6.5–8.1)

## 2019-06-06 LAB — GLUCOSE, CAPILLARY
Glucose-Capillary: 196 mg/dL — ABNORMAL HIGH (ref 70–99)
Glucose-Capillary: 117 mg/dL — ABNORMAL HIGH (ref 70–99)
Glucose-Capillary: 135 mg/dL — ABNORMAL HIGH (ref 70–99)
Glucose-Capillary: 196 mg/dL — ABNORMAL HIGH (ref 70–99)
Glucose-Capillary: 309 mg/dL — ABNORMAL HIGH (ref 70–99)

## 2019-06-06 LAB — HEMOGLOBIN A1C
Hgb A1c MFr Bld: 13.1 % — ABNORMAL HIGH (ref 4.8–5.6)
Mean Plasma Glucose: 329.27 mg/dL

## 2019-06-06 LAB — MAGNESIUM: Magnesium: 1.3 mg/dL — ABNORMAL LOW (ref 1.7–2.4)

## 2019-06-06 SURGERY — IRRIGATION AND DEBRIDEMENT EXTREMITY
Anesthesia: General | Site: Shoulder | Laterality: Left

## 2019-06-06 MED ORDER — PROPOFOL 10 MG/ML IV BOLUS
INTRAVENOUS | Status: DC | PRN
  Administered 2019-06-06: 110 mg via INTRAVENOUS

## 2019-06-06 MED ORDER — ONDANSETRON HCL 4 MG PO TABS: 4.0000 mg | ORAL_TABLET | Freq: Four times a day (QID) | ORAL | Status: DC | PRN

## 2019-06-06 MED ORDER — PHENYLEPHRINE 40 MCG/ML (10ML) SYRINGE FOR IV PUSH (FOR BLOOD PRESSURE SUPPORT)
PREFILLED_SYRINGE | INTRAVENOUS | Status: DC | PRN
  Administered 2019-06-06 (×2): 160 ug via INTRAVENOUS

## 2019-06-06 MED ORDER — ESMOLOL HCL 100 MG/10ML IV SOLN
INTRAVENOUS | Status: DC | PRN
  Administered 2019-06-06: 30 mg via INTRAVENOUS
  Administered 2019-06-06: 40 mg via INTRAVENOUS

## 2019-06-06 MED ORDER — MIDAZOLAM HCL 2 MG/2ML IJ SOLN
INTRAMUSCULAR | Status: AC
  Filled 2019-06-06: qty 2

## 2019-06-06 MED ORDER — ONDANSETRON HCL 4 MG/2ML IJ SOLN
INTRAMUSCULAR | Status: DC | PRN
  Administered 2019-06-06: 4 mg via INTRAVENOUS

## 2019-06-06 MED ORDER — SODIUM CHLORIDE 0.9 % IV SOLN
INTRAVENOUS | Status: DC | PRN
  Administered 2019-06-06: 18:00:00 30 ug/min via INTRAVENOUS

## 2019-06-06 MED ORDER — ONDANSETRON HCL 4 MG/2ML IJ SOLN: 4.0000 mg | Freq: Four times a day (QID) | INTRAMUSCULAR | Status: DC | PRN

## 2019-06-06 MED ORDER — HYDROMORPHONE HCL 1 MG/ML IJ SOLN
INTRAMUSCULAR | Status: AC
  Filled 2019-06-06: qty 1

## 2019-06-06 MED ORDER — POTASSIUM CHLORIDE 10 MEQ/100ML IV SOLN
10.0000 meq | INTRAVENOUS | Status: AC
  Administered 2019-06-06 (×3): 10 meq via INTRAVENOUS
  Filled 2019-06-06 (×3): qty 100

## 2019-06-06 MED ORDER — LIDOCAINE 2% (20 MG/ML) 5 ML SYRINGE
INTRAMUSCULAR | Status: DC | PRN
  Administered 2019-06-06: 60 mg via INTRAVENOUS

## 2019-06-06 MED ORDER — METOCLOPRAMIDE HCL 5 MG PO TABS: 5.0000 mg | ORAL_TABLET | Freq: Three times a day (TID) | ORAL | Status: DC | PRN

## 2019-06-06 MED ORDER — SODIUM CHLORIDE 0.9 % IR SOLN
Status: DC | PRN
  Administered 2019-06-06 (×2): 3000 mL

## 2019-06-06 MED ORDER — POTASSIUM CHLORIDE 10 MEQ/100ML IV SOLN
10.0000 meq | Freq: Once | INTRAVENOUS | Status: AC
  Administered 2019-06-06: 10 meq via INTRAVENOUS
  Filled 2019-06-06: qty 100

## 2019-06-06 MED ORDER — FENTANYL CITRATE (PF) 250 MCG/5ML IJ SOLN
INTRAMUSCULAR | Status: AC
  Filled 2019-06-06: qty 5

## 2019-06-06 MED ORDER — METOCLOPRAMIDE HCL 5 MG/ML IJ SOLN: 5.0000 mg | Freq: Three times a day (TID) | INTRAMUSCULAR | Status: DC | PRN

## 2019-06-06 MED ORDER — OXYCODONE HCL 5 MG PO TABS
ORAL_TABLET | ORAL | Status: AC
  Administered 2019-06-08: 5 mg via ORAL
  Filled 2019-06-06: qty 1

## 2019-06-06 MED ORDER — MAGNESIUM SULFATE 2 GM/50ML IV SOLN
2.0000 g | Freq: Once | INTRAVENOUS | Status: DC
  Filled 2019-06-06: qty 50

## 2019-06-06 MED ORDER — HYDROMORPHONE HCL 1 MG/ML IJ SOLN
0.2500 mg | INTRAMUSCULAR | Status: DC | PRN
  Administered 2019-06-06 (×3): 0.5 mg via INTRAVENOUS

## 2019-06-06 MED ORDER — MIDAZOLAM HCL 2 MG/2ML IJ SOLN
INTRAMUSCULAR | Status: DC | PRN
  Administered 2019-06-06: 1 mg via INTRAVENOUS

## 2019-06-06 MED ORDER — ROCURONIUM BROMIDE 10 MG/ML (PF) SYRINGE
PREFILLED_SYRINGE | INTRAVENOUS | Status: DC | PRN
  Administered 2019-06-06: 40 mg via INTRAVENOUS

## 2019-06-06 MED ORDER — ESMOLOL HCL 100 MG/10ML IV SOLN
INTRAVENOUS | Status: AC
  Filled 2019-06-06: qty 10

## 2019-06-06 MED ORDER — FENTANYL CITRATE (PF) 250 MCG/5ML IJ SOLN
INTRAMUSCULAR | Status: DC | PRN
  Administered 2019-06-06 (×3): 50 ug via INTRAVENOUS
  Administered 2019-06-06: 100 ug via INTRAVENOUS

## 2019-06-06 MED ORDER — LACTATED RINGERS IV SOLN
INTRAVENOUS | Status: DC
  Administered 2019-06-06: 17:00:00 via INTRAVENOUS

## 2019-06-06 MED ORDER — DOCUSATE SODIUM 100 MG PO CAPS
100.0000 mg | ORAL_CAPSULE | Freq: Two times a day (BID) | ORAL | Status: DC
  Administered 2019-06-07 – 2019-06-11 (×6): 100 mg via ORAL
  Filled 2019-06-06 (×12): qty 1

## 2019-06-06 MED ORDER — ONDANSETRON HCL 4 MG/2ML IJ SOLN
INTRAMUSCULAR | Status: AC
  Filled 2019-06-06: qty 2

## 2019-06-06 MED ORDER — SUCCINYLCHOLINE CHLORIDE 200 MG/10ML IV SOSY
PREFILLED_SYRINGE | INTRAVENOUS | Status: DC | PRN
  Administered 2019-06-06: 80 mg via INTRAVENOUS

## 2019-06-06 MED ORDER — SUGAMMADEX SODIUM 200 MG/2ML IV SOLN
INTRAVENOUS | Status: DC | PRN
  Administered 2019-06-06: 200 mg via INTRAVENOUS

## 2019-06-06 SURGICAL SUPPLY — 45 items
BANDAGE ACE 4X5 VEL STRL LF (GAUZE/BANDAGES/DRESSINGS) ×2 IMPLANT
BNDG COHESIVE 4X5 TAN STRL (GAUZE/BANDAGES/DRESSINGS) ×5 IMPLANT
BNDG GAUZE ELAST 4 BULKY (GAUZE/BANDAGES/DRESSINGS) ×2 IMPLANT
COVER MAYO STAND STRL (DRAPES) ×2 IMPLANT
COVER SURGICAL LIGHT HANDLE (MISCELLANEOUS) ×3 IMPLANT
DRAIN PENROSE 1/4X12 LTX STRL (WOUND CARE) ×2 IMPLANT
DRAPE ORTHO SPLIT 77X108 STRL (DRAPES) ×4
DRAPE SURG 17X23 STRL (DRAPES) IMPLANT
DRAPE SURG ORHT 6 SPLT 77X108 (DRAPES) IMPLANT
DRAPE U-SHAPE 47X51 STRL (DRAPES) ×3 IMPLANT
DURAPREP 26ML APPLICATOR (WOUND CARE) ×3 IMPLANT
ELECT REM PT RETURN 9FT ADLT (ELECTROSURGICAL)
ELECTRODE REM PT RTRN 9FT ADLT (ELECTROSURGICAL) IMPLANT
GAUZE SPONGE 4X4 12PLY STRL (GAUZE/BANDAGES/DRESSINGS) ×6 IMPLANT
GAUZE SPONGE 4X4 12PLY STRL LF (GAUZE/BANDAGES/DRESSINGS) ×2 IMPLANT
GAUZE XEROFORM 5X9 LF (GAUZE/BANDAGES/DRESSINGS) ×3 IMPLANT
GLOVE BIO SURGEON STRL SZ7.5 (GLOVE) ×7 IMPLANT
GLOVE BIOGEL PI IND STRL 8 (GLOVE) ×1 IMPLANT
GLOVE BIOGEL PI INDICATOR 8 (GLOVE) ×2
GLOVE SURG SS PI 6.5 STRL IVOR (GLOVE) ×2 IMPLANT
GOWN STRL REUS W/ TWL LRG LVL3 (GOWN DISPOSABLE) ×2 IMPLANT
GOWN STRL REUS W/ TWL XL LVL3 (GOWN DISPOSABLE) ×1 IMPLANT
GOWN STRL REUS W/TWL LRG LVL3 (GOWN DISPOSABLE) ×4
GOWN STRL REUS W/TWL XL LVL3 (GOWN DISPOSABLE) ×2
KIT BASIN OR (CUSTOM PROCEDURE TRAY) ×3 IMPLANT
KIT TURNOVER KIT B (KITS) ×3 IMPLANT
MANIFOLD NEPTUNE II (INSTRUMENTS) ×3 IMPLANT
NS IRRIG 1000ML POUR BTL (IV SOLUTION) ×6 IMPLANT
PACK ORTHO EXTREMITY (CUSTOM PROCEDURE TRAY) ×3 IMPLANT
PAD ABD 8X10 STRL (GAUZE/BANDAGES/DRESSINGS) ×3 IMPLANT
PAD ARMBOARD 7.5X6 YLW CONV (MISCELLANEOUS) ×6 IMPLANT
RESTRAINT HEAD UNIVERSAL NS (MISCELLANEOUS) ×2 IMPLANT
SPONGE LAP 18X18 RF (DISPOSABLE) ×4 IMPLANT
STAPLER VISISTAT 35W (STAPLE) ×2 IMPLANT
STOCKINETTE IMPERVIOUS 9X36 MD (GAUZE/BANDAGES/DRESSINGS) ×5 IMPLANT
SWAB CULTURE ESWAB REG 1ML (MISCELLANEOUS) IMPLANT
TAPE CLOTH SURG 6X10 WHT LF (GAUZE/BANDAGES/DRESSINGS) ×2 IMPLANT
TOWEL GREEN STERILE (TOWEL DISPOSABLE) ×3 IMPLANT
TOWEL GREEN STERILE FF (TOWEL DISPOSABLE) ×3 IMPLANT
TUBE CONNECTING 12'X1/4 (SUCTIONS) ×1
TUBE CONNECTING 12X1/4 (SUCTIONS) ×2 IMPLANT
TUBING CYSTO DISP (UROLOGICAL SUPPLIES) ×3 IMPLANT
TUBING TUR DISP (UROLOGICAL SUPPLIES) ×2 IMPLANT
UNDERPAD 30X30 (UNDERPADS AND DIAPERS) ×6 IMPLANT
YANKAUER SUCT BULB TIP NO VENT (SUCTIONS) ×3 IMPLANT
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
## 2019-06-06 NOTE — H&P (Signed)
H&P update  The surgical history has been reviewed and remains accurate without interval change.  The patient was re-examined and patient's physiologic condition has not changed significantly in the last 30 days. The condition still exists that makes this procedure necessary. The treatment plan remains the same, without new options for care.  No new pharmacological allergies or types of therapy has been initiated that would change the plan or the appropriateness of the plan.  The patient and/or family understand the potential benefits and risks.  Patient with persistent pain despite perc aspiration.  Indicated for open I and D with shoulder as well.  All questions solicited and answered to her satisfaction and informed consent obtained.  Brynlee Pennywell P. Stann Mainland, MD 06/06/2019 5:05 PM

## 2019-06-06 NOTE — Anesthesia Preprocedure Evaluation (Addendum)
Anesthesia Evaluation  Patient identified by MRN, date of birth, ID band Patient awake    Reviewed: Allergy & Precautions, H&P , NPO status , Patient's Chart, lab work & pertinent test results  Airway Mallampati: II  TM Distance: >3 FB Neck ROM: Full    Dental no notable dental hx. (+) Edentulous Upper, Partial Lower, Dental Advisory Given   Pulmonary COPD,  COPD inhaler, former smoker,    Pulmonary exam normal breath sounds clear to auscultation       Cardiovascular hypertension, Pt. on medications + CAD, + Past MI, + Cardiac Stents and + Peripheral Vascular Disease   Rhythm:Regular Rate:Normal     Neuro/Psych  Headaches, Anxiety Depression    GI/Hepatic Neg liver ROS, PUD, GERD  Medicated and Controlled,  Endo/Other  negative endocrine ROSdiabetes, Insulin Dependent  Renal/GU Renal InsufficiencyRenal disease  negative genitourinary   Musculoskeletal  (+) Arthritis , Osteoarthritis,    Abdominal   Peds  Hematology  (+) Blood dyscrasia, anemia ,   Anesthesia Other Findings   Reproductive/Obstetrics negative OB ROS                            Anesthesia Physical Anesthesia Plan  ASA: III  Anesthesia Plan: General   Post-op Pain Management:    Induction: Intravenous  PONV Risk Score and Plan: 4 or greater and Ondansetron, Midazolam and Treatment may vary due to age or medical condition  Airway Management Planned: Oral ETT  Additional Equipment:   Intra-op Plan:   Post-operative Plan: Extubation in OR  Informed Consent: I have reviewed the patients History and Physical, chart, labs and discussed the procedure including the risks, benefits and alternatives for the proposed anesthesia with the patient or authorized representative who has indicated his/her understanding and acceptance.     Dental advisory given  Plan Discussed with: CRNA  Anesthesia Plan Comments:          Anesthesia Quick Evaluation

## 2019-06-06 NOTE — Progress Notes (Signed)
Inpatient Diabetes Program Recommendations  AACE/ADA: New Consensus Statement on Inpatient Glycemic Control (2015)  Target Ranges:  Prepandial:   less than 140 mg/dL      Peak postprandial:   less than 180 mg/dL (1-2 hours)      Critically ill patients:  140 - 180 mg/dL   Lab Results  Component Value Date   GLUCAP 117 (H) 06/06/2019   HGBA1C 13.1 (H) 06/06/2019    Review of Glycemic Control Results for Alejandra Hernandez, Alejandra Hernandez (MRN [id number]) as of 06/06/2019 15:22  Ref. Range 06/05/2019 12:05 06/05/2019 15:39 06/05/2019 21:25 06/06/2019 07:40 06/06/2019 12:42  Glucose-Capillary Latest Ref Range: 70 - 99 mg/dL 234 (H) 82 134 (H) 196 (H) 117 (H)   Diabetes history: DM2 Outpatient Diabetes medications: Tresiba 68 units qd + Humalog correction scale Current orders for Inpatient glycemic control: Lantus 68 units qd + Novolog sensitive correction tid  Inpatient Diabetes Program Recommendations:   Spoke with patient regarding A1c of 13.1 (average blood glucose of 329 over the past 2-3 months). I personally spoke with patient 04/13/19 and reviewed diabetes management with patient. Patient plans to followup with her endocrinologist Alejandra Hernandez @ [facility] for diabetes management.  Thank you, Nani Gasser. Dhillon Comunale, RN, MSN, CDE  Diabetes Coordinator Inpatient Glycemic Control Team Team Pager [phone] (8am-5pm) 06/06/2019 3:39 PM

## 2019-06-06 NOTE — Brief Op Note (Signed)
06/06/2019  6:35 PM  PATIENT:  Alejandra Hernandez  63 y.o. female  PRE-OPERATIVE DIAGNOSIS:  Diabetic Abscess Left Arm  POST-OPERATIVE DIAGNOSIS:  * No post-op diagnosis entered *  PROCEDURE:  Procedure(s): IRRIGATION AND DEBRIDEMENT ARM and SHOULDER (Left)  SURGEON:  Surgeon(s) and Role:    * Nicholes Stairs, MD - Primary  PHYSICIAN ASSISTANT: none  ASSISTANTS: none   ANESTHESIA:   general  EBL:  Minimal   BLOOD ADMINISTERED:none  DRAINS: Penrose drain in the 2 penrose in left brachium   LOCAL MEDICATIONS USED:  NONE  SPECIMEN:  No Specimen  DISPOSITION OF SPECIMEN:  N/A  COUNTS:  YES  TOURNIQUET:  * No tourniquets in log *  DICTATION: .Note written in EPIC  PLAN OF CARE: Admit to inpatient   PATIENT DISPOSITION:  PACU - hemodynamically stable.   Delay start of Pharmacological VTE agent (>24hrs) due to surgical blood loss or risk of bleeding: not applicable

## 2019-06-06 NOTE — Progress Notes (Signed)
[place] for Heparin Indication: Hx of DVT on Xarelto PTA  Allergies  Allergen Reactions  . Lisinopril Hives   Patient Measurements: Weight: 134 lb 14.7 oz (61.2 kg) Heparin Dosing Weight: 63.5 kg  Vital Signs: Temp: 98.3 F (36.8 C) (07/13 0737) Temp Source: Oral (07/13 0737) BP: 158/73 (07/13 0737) Pulse Rate: 107 (07/13 0737)  Labs: Recent Labs    06/03/19 1600 06/04/19 0200  06/04/19 2101 06/05/19 0828 06/06/19 0603  HGB 10.4* 9.9*  --   --  10.5* 10.6*  HCT 34.6* 33.1*  --   --  34.8* 35.5*  PLT 419* 413*  --   --  457* 403*  APTT  --   --   --  67* 72*  --   HEPARINUNFRC  --   --    < > 0.33 0.52 0.59  CREATININE 1.14* 1.11*  --   --   --  1.29*   < > = values in this interval not displayed.    Estimated Creatinine Clearance: 37.4 mL/min (A) (by C-G formula based on SCr of 1.29 mg/dL (H)).   Medical History: Past Medical History:  Diagnosis Date  . Anemia   . Anxiety   . Arthritis    "right knee, back, feet, hands" (10/20/2018)  . Bleeding stomach ulcer 01/2016  . CAD (coronary artery disease)    05/19/17 PCI with DESx1 to [facility]  . Carotid artery disease (HCC)    40-59% right and 1-39% left by doppler 05/2018 with stenotic right subclavian artery  . Chronic lower back pain   . CKD (chronic kidney disease) stage 3, GFR 30-59 ml/min (HCC) 01/2016  . Depression   . Diabetic peripheral neuropathy ([place])   . DVT (deep venous thrombosis) ([place]) 12/2016   BLE  . GERD (gastroesophageal reflux disease)   . GI bleed   . Gout   . Headache    "weekly" (11/27//2019)  . History of blood transfusion 2017   "related to stomach bleeding"  . Hyperlipidemia   . Hypertension   . NSTEMI (non-ST elevated myocardial infarction) ([place]) 05/2017  . PAD (peripheral artery disease) ([place])    a. LE stenting in 2017, 12/2017, 09/2018 - Dr. Fletcher Anon  . Pneumonia    "several times" (10/20/2018)  . Renal artery stenosis (HCC)    a.  mild to  moderate RAS by aortogram in 2017 (no evidence of this on duplex 06/2018).  . Stenosis of right subclavian artery ([place])   . Type II diabetes mellitus (HCC)    Medications:  Scheduled:  . amLODipine  10 mg Oral QHS  . chlorhexidine  60 mL Topical Once  . cloNIDine  0.1 mg Oral BID  . ezetimibe  10 mg Oral Daily  . feeding supplement  296 mL Oral Once  . furosemide  20 mg Oral BH-q7a  . gabapentin  300 mg Oral TID  . insulin aspart  0-9 Units Subcutaneous TID WC  . insulin glargine  68 Units Subcutaneous QHS  . isosorbide mononitrate  90 mg Oral Daily  . pantoprazole  40 mg Oral BID  . potassium chloride SA  10 mEq Oral Daily  . povidone-iodine  2 application Topical Once  . rosuvastatin  40 mg Oral QHS   Infusions:  .  ceFAZolin (ANCEF) IV    . cefTRIAXone (ROCEPHIN)  IV 2 g (06/05/19 1342)  . heparin 1,000 Units/hr (06/05/19 1547)  . vancomycin 1,000 mg (06/05/19 1552)    Assessment: 44 yoF  admit 7/10 L arm pain/swelling. Aspiration of L arm abscess 7/11.   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    Today, 06/06/2019 Heparin level therapeutic at 0.59, CBC stable, no bleeding noted  Goal of Therapy:  Heparin level 0.3-0.7 units/ml Monitor platelets by anticoagulation protocol: Yes   Plan:   Continue Heparin drip at 1000 units/hr  Monitor using hep levels only  Plan I&D Monday or Tuesday  Daily hep level, daily CBC  Berenice Bouton, PharmD PGY1 Pharmacy Resident Office phone: [phone]  06/06/2019,8:05 AM
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
## 2019-06-06 NOTE — Discharge Instructions (Signed)
Orthopedic discharge instructions:  - apply daily dry dressing to the left arm incisions - keep the incision clean and dry, do not get wet until after your follow up appointment with Dr. Stann Mainland -Wear sling to the left arm for comfort only.  You may remove the arm and use it for activities of daily living and other activities as tolerated. -Return to see Dr. Stann Mainland in the office in 3 weeks for routine postoperative wound check and staple removal.

## 2019-06-06 NOTE — Transfer of Care (Signed)
Immediate Anesthesia Transfer of Care Note  Patient: NORENA BRATTON  Procedure(s) Performed: IRRIGATION AND DEBRIDEMENT ARM and SHOULDER (Left Shoulder)  Patient Location: PACU  Anesthesia Type:General  Level of Consciousness: awake, alert  and patient cooperative  Airway & Oxygen Therapy: Patient Spontanous Breathing  Post-op Assessment: Report given to RN and Post -op Vital signs reviewed and stable  Post vital signs: Reviewed and stable  Last Vitals:  Vitals Value Taken Time  BP 170/96 06/06/19 1834  Temp 36.6 C 06/06/19 1834  Pulse 101 06/06/19 1834  Resp 19 06/06/19 1834  SpO2 96 % 06/06/19 1834    Last Pain:  Vitals:   06/06/19 1533  TempSrc: Oral  PainSc:       Patients Stated Pain Goal: 1 (93/79/02 4097)  Complications: No apparent anesthesia complications

## 2019-06-06 NOTE — Anesthesia Procedure Notes (Signed)
Procedure Name: Intubation Date/Time: 06/06/2019 5:17 PM Performed by: Elayne Snare, CRNA Pre-anesthesia Checklist: Patient identified, Emergency Drugs available, Suction available and Patient being monitored Patient Re-evaluated:Patient Re-evaluated prior to induction Oxygen Delivery Method: Circle System Utilized Preoxygenation: Pre-oxygenation with 100% oxygen Induction Type: IV induction and Rapid sequence Laryngoscope Size: Mac and 3 Grade View: Grade I Tube type: Oral Tube size: 7.0 mm Number of attempts: 1 Airway Equipment and Method: Stylet Placement Confirmation: ETT inserted through vocal cords under direct vision,  positive ETCO2 and breath sounds checked- equal and bilateral Secured at: 22 cm Tube secured with: Tape Dental Injury: Teeth and Oropharynx as per pre-operative assessment

## 2019-06-06 NOTE — Anesthesia Postprocedure Evaluation (Signed)
Anesthesia Post Note  Patient: Alejandra Hernandez  Procedure(s) Performed: IRRIGATION AND DEBRIDEMENT ARM and SHOULDER (Left Shoulder)     Patient location during evaluation: PACU Anesthesia Type: General Level of consciousness: awake and alert Pain management: pain level controlled Vital Signs Assessment: post-procedure vital signs reviewed and stable Respiratory status: spontaneous breathing, nonlabored ventilation, respiratory function stable and patient connected to nasal cannula oxygen Cardiovascular status: blood pressure returned to baseline and stable Postop Assessment: no apparent nausea or vomiting Anesthetic complications: no    Last Vitals:  Vitals:   06/06/19 1917 06/06/19 1940  BP: (!) 165/70 (!) 164/75  Pulse: (!) 103 (!) 105  Resp: 16   Temp:  36.8 C  SpO2: 90% 98%    Last Pain:  Vitals:   06/06/19 2020  TempSrc:   PainSc: 7                  Tennelle Taflinger COKER

## 2019-06-06 NOTE — Progress Notes (Signed)
One ring removed in Short Stay and picked up by 5N RN.

## 2019-06-07 ENCOUNTER — Encounter (HOSPITAL_COMMUNITY): Payer: Self-pay | Admitting: Orthopedic Surgery

## 2019-06-07 LAB — COMPREHENSIVE METABOLIC PANEL
ALT: 93 U/L — ABNORMAL HIGH (ref 0–44)
AST: 107 U/L — ABNORMAL HIGH (ref 15–41)
Albumin: 2.3 g/dL — ABNORMAL LOW (ref 3.5–5.0)
Alkaline Phosphatase: 105 U/L (ref 38–126)
Anion gap: 13 (ref 5–15)
BUN: 8 mg/dL (ref 8–23)
CO2: 19 mmol/L — ABNORMAL LOW (ref 22–32)
Calcium: 8.8 mg/dL — ABNORMAL LOW (ref 8.9–10.3)
Chloride: 103 mmol/L (ref 98–111)
Creatinine, Ser: 1.21 mg/dL — ABNORMAL HIGH (ref 0.44–1.00)
GFR calc Af Amer: 56 mL/min — ABNORMAL LOW (ref >60)
GFR calc non Af Amer: 48 mL/min — ABNORMAL LOW (ref >60)
Glucose, Bld: 196 mg/dL — ABNORMAL HIGH (ref 70–99)
Potassium: 3.7 mmol/L (ref 3.5–5.1)
Sodium: 135 mmol/L (ref 135–145)
Total Bilirubin: 0.5 mg/dL (ref 0.3–1.2)
Total Protein: 6.8 g/dL (ref 6.5–8.1)

## 2019-06-07 LAB — GLUCOSE, CAPILLARY
Glucose-Capillary: 176 mg/dL — ABNORMAL HIGH (ref 70–99)
Glucose-Capillary: 77 mg/dL (ref 70–99)
Glucose-Capillary: 160 mg/dL — ABNORMAL HIGH (ref 70–99)
Glucose-Capillary: 242 mg/dL — ABNORMAL HIGH (ref 70–99)

## 2019-06-07 LAB — CBC
HCT: 34.5 % — ABNORMAL LOW (ref 36.0–46.0)
Hemoglobin: 10.4 g/dL — ABNORMAL LOW (ref 12.0–15.0)
MCH: 21.5 pg — ABNORMAL LOW (ref 26.0–34.0)
MCHC: 30.1 g/dL (ref 30.0–36.0)
MCV: 71.3 fL — ABNORMAL LOW (ref 80.0–100.0)
Platelets: 420 10*3/uL — ABNORMAL HIGH (ref 150–400)
RBC: 4.84 MIL/uL (ref 3.87–5.11)
RDW: 15.3 % (ref 11.5–15.5)
WBC: 15.1 10*3/uL — ABNORMAL HIGH (ref 4.0–10.5)
nRBC: 0.1 % (ref 0.0–0.2)

## 2019-06-07 LAB — VANCOMYCIN, PEAK: Vancomycin Pk: 40 ug/mL (ref 30–40)

## 2019-06-07 LAB — HEPARIN LEVEL (UNFRACTIONATED)
Heparin Unfractionated: <0.1 IU/mL — ABNORMAL LOW (ref 0.30–0.70)
Heparin Unfractionated: 0.69 IU/mL (ref 0.30–0.70)

## 2019-06-07 LAB — MAGNESIUM: Magnesium: 2.1 mg/dL (ref 1.7–2.4)

## 2019-06-07 MED ORDER — MAGNESIUM SULFATE 2 GM/50ML IV SOLN
2.0000 g | Freq: Once | INTRAVENOUS | Status: AC
  Administered 2019-06-07: 2 g via INTRAVENOUS
  Filled 2019-06-07: qty 50

## 2019-06-07 NOTE — Progress Notes (Signed)
[place] for Heparin Indication: Hx of DVT on Xarelto PTA  Allergies  Allergen Reactions  . Lisinopril Hives   Patient Measurements: Weight: 143 lb 1.3 oz (64.9 kg) Heparin Dosing Weight: 63.5 kg  Vital Signs: Temp: 99.5 F (37.5 C) (07/14 1358) Temp Source: Oral (07/14 1358) BP: 133/64 (07/14 1358) Pulse Rate: 98 (07/14 1358)  Labs: Recent Labs    06/04/19 2101  06/05/19 0828 06/06/19 0603 06/07/19 0334 06/07/19 1342  HGB  --    < > 10.5* 10.6* 10.4*  --   HCT  --   --  34.8* 35.5* 34.5*  --   PLT  --   --  457* 403* 420*  --   APTT 67*  --  72*  --   --   --   HEPARINUNFRC 0.33  --  0.52 0.59 <0.10* 0.69  CREATININE  --   --   --  1.29* 1.21*  --    < > = values in this interval not displayed.    Estimated Creatinine Clearance: 43.7 mL/min (A) (by C-G formula based on SCr of 1.21 mg/dL (H)).   Medical History: Past Medical History:  Diagnosis Date  . Anemia   . Anxiety   . Arthritis    "right knee, back, feet, hands" (10/20/2018)  . Bleeding stomach ulcer 01/2016  . CAD (coronary artery disease)    05/19/17 PCI with DESx1 to [facility]  . Carotid artery disease (HCC)    40-59% right and 1-39% left by doppler 05/2018 with stenotic right subclavian artery  . Chronic lower back pain   . CKD (chronic kidney disease) stage 3, GFR 30-59 ml/min (HCC) 01/2016  . Depression   . Diabetic peripheral neuropathy ([place])   . DVT (deep venous thrombosis) ([place]) 12/2016   BLE  . GERD (gastroesophageal reflux disease)   . GI bleed   . Gout   . Headache    "weekly" (11/27//2019)  . History of blood transfusion 2017   "related to stomach bleeding"  . Hyperlipidemia   . Hypertension   . NSTEMI (non-ST elevated myocardial infarction) ([place]) 05/2017  . PAD (peripheral artery disease) ([place])    a. LE stenting in 2017, 12/2017, 09/2018 - Dr. Fletcher Anon  . Pneumonia    "several times" (10/20/2018)  . Renal artery stenosis (HCC)    a.  mild to  moderate RAS by aortogram in 2017 (no evidence of this on duplex 06/2018).  . Stenosis of right subclavian artery ([place])   . Type II diabetes mellitus (HCC)    Medications:  Scheduled:  . amLODipine  10 mg Oral QHS  . cloNIDine  0.1 mg Oral BID  . docusate sodium  100 mg Oral BID  . ezetimibe  10 mg Oral Daily  . furosemide  20 mg Oral BH-q7a  . gabapentin  300 mg Oral TID  . insulin aspart  0-9 Units Subcutaneous TID WC  . insulin glargine  68 Units Subcutaneous QHS  . isosorbide mononitrate  90 mg Oral Daily  . pantoprazole  40 mg Oral BID  . potassium chloride SA  10 mEq Oral Daily  . rosuvastatin  40 mg Oral QHS   Infusions:  . cefTRIAXone (ROCEPHIN)  IV 2 g (06/07/19 1333)  . heparin 1,200 Units/hr (06/07/19 0559)  . lactated ringers 10 mL/hr at 06/06/19 1632  . vancomycin 1,000 mg (06/06/19 1632)    Assessment: 34 yoF admit 7/10 L arm pain/swelling. Aspiration of L arm abscess 7/11.  PMH: CAD/PCI, DM2, HTN, HPL, PVD, R subclavian stenosis, hx bilateral DVT's on Xarelto, COPD, dCHF, CKD2  PTA Xarelto 20mg  qd, LD 7/9    Today, 06/07/2019 Heparin level therapeutic at 0.69, but at top of range, CBC stable, no bleeding noted  Goal of Therapy:  Heparin level 0.3-0.7 units/ml Monitor platelets by anticoagulation protocol: Yes   Plan:   Will decrease Heparin drip slightly to 1150 units/hr  Daily hep level, daily CBC  Nicolaus Andel A. Levada Dy, PharmD, [place] Please utilize Amion for appropriate phone number to reach the unit pharmacist ([place])    06/07/2019,3:30 PM

## 2019-06-07 NOTE — Care Management Important Message (Signed)
Important Message  Patient Details  Name: Alejandra Hernandez MRN: [id number] Date of Birth: Feb 04, 1956   Medicare Important Message Given:  Yes     Memory Argue 06/07/2019, 12:54 PM    PATIENT HAS SIGNED IM
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
## 2019-06-07 NOTE — Progress Notes (Signed)
   Subjective:  Patient reports pain as mild to moderate.  Feeling some better.  Denies f/ns/chills.  Denies numbness or paresthesias.  Objective:   VITALS:   Vitals:   06/06/19 1940 06/07/19 0413 06/07/19 0436 06/07/19 0851  BP: (!) 164/75 (!) 164/68  132/61  Pulse: (!) 105 (!) 122  95  Resp:  14    Temp: 98.3 F (36.8 C) 99.8 F (37.7 C)  99.8 F (37.7 C)  TempSrc: Oral Oral  Oral  SpO2: 98% 94%  95%  Weight:   64.9 kg     Neurologically intact Neurovascular intact Sensation intact distally Intact pulses distally Incision: dressing C/D/I and no drainage Compartment soft   Lab Results  Component Value Date   WBC 15.1 (H) 06/07/2019   HGB 10.4 (L) 06/07/2019   HCT 34.5 (L) 06/07/2019   MCV 71.3 (L) 06/07/2019   PLT 420 (H) 06/07/2019   BMET    Component Value Date/Time   NA 135 06/07/2019 0334   NA 134 12/14/2018 0935   K 3.7 06/07/2019 0334   CL 103 06/07/2019 0334   CO2 19 (L) 06/07/2019 0334   GLUCOSE 196 (H) 06/07/2019 0334   BUN 8 06/07/2019 0334   BUN 15 12/14/2018 0935   CREATININE 1.21 (H) 06/07/2019 0334   CREATININE 1.27 (H) 05/22/2016 1424   CALCIUM 8.8 (L) 06/07/2019 0334   GFRNONAA 48 (L) 06/07/2019 0334   GFRNONAA 43 (L) 04/23/2016 0939   GFRAA 56 (L) 06/07/2019 0334   GFRAA 50 (L) 04/23/2016 0939     Assessment/Plan: 1 Day Post-Op   Principal Problem:   Left upper extremity swelling Active Problems:   Hyperlipidemia with target low density lipoprotein (LDL) cholesterol less than 70 mg/dL   Essential hypertension   Chronic diastolic CHF (congestive heart failure) (HCC)   History of DVT (deep vein thrombosis)   CAD (coronary artery disease)   Hypokalemia   COPD, mild (HCC)   - ok for ADLs with left arm - maintain bandage x 1 more day.  Will pull drains tomorrow and assess for need of repeat I and D, later in week. Nicholes Stairs 06/07/2019, 12:23 PM   Geralynn Rile, MD [phone]

## 2019-06-07 NOTE — Plan of Care (Signed)

## 2019-06-07 NOTE — Progress Notes (Signed)
Pharmacy Antibiotic Note  Alejandra Hernandez is a 63 y.o. female admitted on 06/03/2019 with L arm abscess.  Pharmacy has been consulted for Vancomycin dosing.  Plan: Rocephin 2gm q24 per MD Continue Vanc1gm q24 for AUC 540, SCr 1.11 Vanc levels after today's dose (7/14 PM and 7/15 PM)  Weight: 143 lb 1.3 oz (64.9 kg)  Temp (24hrs), Avg:98.9 F (37.2 C), Min:97.9 F (36.6 C), Max:99.8 F (37.7 C)  Recent Labs  Lab 06/03/19 1545 06/03/19 1600 06/03/19 2023 06/04/19 0200 06/05/19 0828 06/06/19 0603 06/07/19 0334  WBC  --  11.2*  --  9.5 14.6* 12.5* 15.1*  CREATININE  --  1.14*  --  1.11*  --  1.29* 1.21*  LATICACIDVEN 1.4  --  1.5  --   --   --   --     Estimated Creatinine Clearance: 43.7 mL/min (A) (by C-G formula based on SCr of 1.21 mg/dL (H)).    Allergies  Allergen Reactions  . Lisinopril Hives   Antimicrobials this admission: 7/11 Rocephin >>  7/11 Vanc >>   Dose adjustments this admission:  Microbiology results: 7/11 BCx: NGTD 7/11 L arm Abscess: NGTD  7/11 MRSA PCR: neg    Anna Beaird A. Levada Dy, PharmD, [place] Please utilize Amion for appropriate phone number to reach the unit pharmacist ([place])    06/07/2019 12:40 PM

## 2019-06-07 NOTE — Progress Notes (Signed)
ANTICOAGULATION CONSULT NOTE - Follow Up Consult  Pharmacy Consult for heparin Indication: h/o DVT  Labs: Recent Labs    06/04/19 2101  06/05/19 0828 06/06/19 0603 06/07/19 0334  HGB  --    < > 10.5* 10.6* 10.4*  HCT  --   --  34.8* 35.5* 34.5*  PLT  --   --  457* 403* 420*  APTT 67*  --  72*  --   --   HEPARINUNFRC 0.33  --  0.52 0.59 <0.10*  CREATININE  --   --   --  1.29* 1.21*   < > = values in this interval not displayed.    Assessment: 63yo female subtherapeutic on heparin after resumed post-op, could be d/t Xarelto clearing; no gtt issues or signs of bleeding per RN.  Goal of Therapy:  Heparin level 0.3-0.7 units/ml   Plan:  Will increase heparin gtt by 3 units/kg/hr to 1200 units/hr and check level in 8 hours.    Wynona Neat, PharmD, BCPS  06/07/2019,5:54 AM

## 2019-06-08 ENCOUNTER — Inpatient Hospital Stay (HOSPITAL_COMMUNITY): Payer: Medicare Other

## 2019-06-08 LAB — COMPREHENSIVE METABOLIC PANEL
ALT: 104 U/L — ABNORMAL HIGH (ref 0–44)
AST: 135 U/L — ABNORMAL HIGH (ref 15–41)
Albumin: 1.9 g/dL — ABNORMAL LOW (ref 3.5–5.0)
Alkaline Phosphatase: 92 U/L (ref 38–126)
Anion gap: 13 (ref 5–15)
BUN: 10 mg/dL (ref 8–23)
CO2: 20 mmol/L — ABNORMAL LOW (ref 22–32)
Calcium: 8.5 mg/dL — ABNORMAL LOW (ref 8.9–10.3)
Chloride: 106 mmol/L (ref 98–111)
Creatinine, Ser: 1.31 mg/dL — ABNORMAL HIGH (ref 0.44–1.00)
GFR calc Af Amer: 50 mL/min — ABNORMAL LOW (ref >60)
GFR calc non Af Amer: 44 mL/min — ABNORMAL LOW (ref >60)
Glucose, Bld: 58 mg/dL — ABNORMAL LOW (ref 70–99)
Potassium: 3.8 mmol/L (ref 3.5–5.1)
Sodium: 139 mmol/L (ref 135–145)
Total Bilirubin: 0.2 mg/dL — ABNORMAL LOW (ref 0.3–1.2)
Total Protein: 6 g/dL — ABNORMAL LOW (ref 6.5–8.1)

## 2019-06-08 LAB — CULTURE, BLOOD (ROUTINE X 2)
Culture: NO GROWTH
Culture: NO GROWTH
Special Requests: ADEQUATE
Special Requests: ADEQUATE

## 2019-06-08 LAB — HEPARIN LEVEL (UNFRACTIONATED): Heparin Unfractionated: 0.67 IU/mL (ref 0.30–0.70)

## 2019-06-08 LAB — CBC
HCT: 30.3 % — ABNORMAL LOW (ref 36.0–46.0)
Hemoglobin: 9.1 g/dL — ABNORMAL LOW (ref 12.0–15.0)
MCH: 21.4 pg — ABNORMAL LOW (ref 26.0–34.0)
MCHC: 30 g/dL (ref 30.0–36.0)
MCV: 71.3 fL — ABNORMAL LOW (ref 80.0–100.0)
Platelets: 380 10*3/uL (ref 150–400)
RBC: 4.25 MIL/uL (ref 3.87–5.11)
RDW: 15.1 % (ref 11.5–15.5)
WBC: 12.3 10*3/uL — ABNORMAL HIGH (ref 4.0–10.5)
nRBC: 0 % (ref 0.0–0.2)

## 2019-06-08 LAB — VANCOMYCIN, TROUGH: Vancomycin Tr: 15 ug/mL (ref 15–20)

## 2019-06-08 LAB — GLUCOSE, CAPILLARY
Glucose-Capillary: 76 mg/dL (ref 70–99)
Glucose-Capillary: 107 mg/dL — ABNORMAL HIGH (ref 70–99)
Glucose-Capillary: 186 mg/dL — ABNORMAL HIGH (ref 70–99)
Glucose-Capillary: 108 mg/dL — ABNORMAL HIGH (ref 70–99)

## 2019-06-08 LAB — MAGNESIUM: Magnesium: 1.6 mg/dL — ABNORMAL LOW (ref 1.7–2.4)

## 2019-06-08 MED ORDER — VANCOMYCIN HCL IN DEXTROSE 750-5 MG/150ML-% IV SOLN
750.0000 mg | INTRAVENOUS | Status: DC
  Administered 2019-06-09: 750 mg via INTRAVENOUS
  Filled 2019-06-08: qty 150

## 2019-06-08 MED ORDER — MAGNESIUM SULFATE 2 GM/50ML IV SOLN
2.0000 g | Freq: Once | INTRAVENOUS | Status: AC
  Administered 2019-06-08: 2 g via INTRAVENOUS
  Filled 2019-06-08: qty 50

## 2019-06-08 NOTE — Progress Notes (Signed)
Inpatient Diabetes Program Recommendations  AACE/ADA: New Consensus Statement on Inpatient Glycemic Control (2015)  Target Ranges:  Prepandial:   less than 140 mg/dL      Peak postprandial:   less than 180 mg/dL (1-2 hours)      Critically ill patients:  140 - 180 mg/dL   Results for THERISA, MENNELLA (MRN [id number]) as of 06/08/2019 14:24  Ref. Range 06/07/2019 08:29 06/07/2019 11:38 06/07/2019 16:43 06/07/2019 22:39  Glucose-Capillary Latest Ref Range: 70 - 99 mg/dL 176 (H) 77 160 (H) 242 (H)    Results for RENARDA, MULLINIX (MRN [id number]) as of 06/08/2019 14:24  Ref. Range 06/08/2019 07:35 06/08/2019 12:01  Glucose-Capillary Latest Ref Range: 70 - 99 mg/dL 76 107 (H)    Home DM Meds: Tresiba 68 units QHS       Humalog 1-10 units TID per SSI  Current Orders: Lantus 68 units QHS      Novolog Sensitive Correction Scale/ SSI (0-9 units) TID AC     MD- Note CBG on the low side this AM (76 mg/dl).  If this trend continues, may consider reducing Lantus to 60 units QHS     --Will follow patient during hospitalization--  Wyn Quaker RN, MSN, CDE Diabetes Coordinator Inpatient Glycemic Control Team Team Pager: [phone] (8a-5p)

## 2019-06-08 NOTE — Progress Notes (Signed)
ANTICOAGULATION CONSULT NOTE - Follow Up Consult  Pharmacy Consult for heparin Indication: history of DVT  Allergies  Allergen Reactions  . Lisinopril Hives    Patient Measurements: Weight: 145 lb 4.5 oz (65.9 kg) Heparin Dosing Weight: 65.9 kg  Vital Signs: Temp: 97.9 F (36.6 C) (07/15 0401) Temp Source: Oral (07/15 0401) BP: 133/62 (07/15 0401) Pulse Rate: 86 (07/15 0401)  Labs: Recent Labs    06/05/19 0828 06/06/19 0603 06/07/19 0334 06/07/19 1342 06/08/19 0329  HGB 10.5* 10.6* 10.4*  --  9.1*  HCT 34.8* 35.5* 34.5*  --  30.3*  PLT 457* 403* 420*  --  380  APTT 72*  --   --   --   --   HEPARINUNFRC 0.52 0.59 <0.10* 0.69 0.67  CREATININE  --  1.29* 1.21*  --  1.31*    Estimated Creatinine Clearance: 40.6 mL/min (A) (by C-G formula based on SCr of 1.31 mg/dL (H)).   Assessment: 63 yo female on Xarelto prior to admission for history of DVT. Patient initiated on heparin per pharmacy while Xarelto on hold for I&D. CBC stable. No signs of bleeding. Heparin level 0.67 at goal.  Goal of Therapy:  Heparin level 0.3-0.7 units/ml Monitor platelets by anticoagulation protocol: Yes   Plan:  Continue heparin 1150 units/hr  Monitor heparin level and CBC daily  Monitor S&S of bleeding  Cristela Felt, PharmD PGY1 Pharmacy Resident [employer]: [phone]  06/08/2019,8:17 AM
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
## 2019-06-08 NOTE — Progress Notes (Signed)
   Subjective:  Patient reports pain as mild to moderate.  Denies f/ns/chills.  Denies numbness or paresthesias.  Objective:   VITALS:   Vitals:   06/08/19 0704 06/08/19 0834 06/08/19 1032 06/08/19 1458  BP:  (!) 155/61 (!) 155/61 135/67  Pulse:  93  94  Resp:  16  17  Temp:  98.6 F (37 C)  98.6 F (37 C)  TempSrc:  Oral  Oral  SpO2:  93%  97%  Weight: 65.9 kg       Neurologically intact Neurovascular intact Sensation intact distally Intact pulses distally Incision: dressing C/D/I and no drainage Compartment soft No pain with AROM or PROm to the left shoulder or elbow, pain centered around biceps Dressing removed, moderate drainage of blood and some purulence.  Penrose in x 2  Lab Results  Component Value Date   WBC 12.3 (H) 06/08/2019   HGB 9.1 (L) 06/08/2019   HCT 30.3 (L) 06/08/2019   MCV 71.3 (L) 06/08/2019   PLT 380 06/08/2019   BMET    Component Value Date/Time   NA 139 06/08/2019 0329   NA 134 12/14/2018 0935   K 3.8 06/08/2019 0329   CL 106 06/08/2019 0329   CO2 20 (L) 06/08/2019 0329   GLUCOSE 58 (L) 06/08/2019 0329   BUN 10 06/08/2019 0329   BUN 15 12/14/2018 0935   CREATININE 1.31 (H) 06/08/2019 0329   CREATININE 1.27 (H) 05/22/2016 1424   CALCIUM 8.5 (L) 06/08/2019 0329   GFRNONAA 44 (L) 06/08/2019 0329   GFRNONAA 43 (L) 04/23/2016 0939   GFRAA 50 (L) 06/08/2019 0329   GFRAA 50 (L) 04/23/2016 0939     Assessment/Plan: 2 Days Post-Op   Principal Problem:   Left upper extremity swelling Active Problems:   Hyperlipidemia with target low density lipoprotein (LDL) cholesterol less than 70 mg/dL   Essential hypertension   Chronic diastolic CHF (congestive heart failure) (HCC)   History of DVT (deep vein thrombosis)   CAD (coronary artery disease)   Hypokalemia   COPD, mild (HCC)   - ok for ADLs with left arm - daily dry dressings, selective staple removal to allow more drainage. - will re-examine tomorrow if able and improved will  pull drains.  Possible need for repeat I and D on Friday if she continues with moderate drainage  - at this time I feel like the scapular mass was an abscess and would not recommend IR biopsy.  Certainly repeat imaging after this episode will help direct need for that   - continue with activity as tolerated to the left arm    Nicholes Stairs 06/08/2019, 3:24 PM   Geralynn Rile, MD [phone]

## 2019-06-08 NOTE — Progress Notes (Addendum)
Pharmacy Antibiotic Note  Alejandra Hernandez is a 63 y.o. female admitted on 06/03/2019 with L arm abscess.  Pharmacy has been consulted for vancomycin dosing. Scr trending up 1.21>>1.31. WBC trending down 15.1>>12.3. Currently afebrile.  - Goal AUC: 400-550 - 7/15 AUC: 678 with vanc 1000mg  every 24hrs - New estimated AUC: 471  Plan: - Decrease vanc to 750mg  every 24hrs  - Continue ceftriaxone 2gm every 24hrs per MD - Monitor WBC, vitals, AUC, and renal function   Weight: 145 lb 4.5 oz (65.9 kg)  Temp (24hrs), Avg:98.4 F (36.9 C), Min:97.9 F (36.6 C), Max:98.6 F (37 C)  Recent Labs  Lab 06/03/19 1545  06/03/19 1600 06/03/19 2023 06/04/19 0200 06/05/19 0828 06/06/19 0603 06/07/19 0334 06/07/19 1944 06/08/19 0329 06/08/19 1526  WBC  --    < > 11.2*  --  9.5 14.6* 12.5* 15.1*  --  12.3*  --   CREATININE  --   --  1.14*  --  1.11*  --  1.29* 1.21*  --  1.31*  --   LATICACIDVEN 1.4  --   --  1.5  --   --   --   --   --   --   --   VANCOTROUGH  --   --   --   --   --   --   --   --   --   --  15  VANCOPEAK  --   --   --   --   --   --   --   --  40  --   --    < > = values in this interval not displayed.    Estimated Creatinine Clearance: 40.6 mL/min (A) (by C-G formula based on SCr of 1.31 mg/dL (H)).    Allergies  Allergen Reactions  . Lisinopril Hives   Antimicrobials this admission: 7/11 ceftriaxone >>  7/11 vanc >>   Dose adjustments this admission:  - Vanc 1000mg  every 24hr >> Vanc 750mg  every 24hr  Microbiology results: 7/11 BCx: NGTD 7/11 L arm Abscess: NGTD  7/11 MRSA PCR: neg   Agnes Lawrence, PharmD PGY1 Pharmacy Resident

## 2019-06-08 NOTE — Progress Notes (Signed)
Radiology called and was told to place the patient in NPO status.  The patient was informed of not eating or drinking anything and will be scheduled for possible procedure after 9pm

## 2019-06-08 NOTE — Progress Notes (Signed)
Penrose drains in the left upper arm x 2.  2 stapes removed as ordered = one above both drains.  Sponge/drain dressings applied with large abd pad.  The patient request, no tape.

## 2019-06-09 LAB — COMPREHENSIVE METABOLIC PANEL
ALT: 143 U/L — ABNORMAL HIGH (ref 0–44)
AST: 154 U/L — ABNORMAL HIGH (ref 15–41)
Albumin: 2.1 g/dL — ABNORMAL LOW (ref 3.5–5.0)
Alkaline Phosphatase: 115 U/L (ref 38–126)
Anion gap: 12 (ref 5–15)
BUN: 9 mg/dL (ref 8–23)
CO2: 22 mmol/L (ref 22–32)
Calcium: 8.8 mg/dL — ABNORMAL LOW (ref 8.9–10.3)
Chloride: 102 mmol/L (ref 98–111)
Creatinine, Ser: 1.45 mg/dL — ABNORMAL HIGH (ref 0.44–1.00)
GFR calc Af Amer: 45 mL/min — ABNORMAL LOW (ref >60)
GFR calc non Af Amer: 38 mL/min — ABNORMAL LOW (ref >60)
Glucose, Bld: 58 mg/dL — ABNORMAL LOW (ref 70–99)
Potassium: 3.8 mmol/L (ref 3.5–5.1)
Sodium: 136 mmol/L (ref 135–145)
Total Bilirubin: 0.5 mg/dL (ref 0.3–1.2)
Total Protein: 6.7 g/dL (ref 6.5–8.1)

## 2019-06-09 LAB — CBC
HCT: 34.4 % — ABNORMAL LOW (ref 36.0–46.0)
Hemoglobin: 10.1 g/dL — ABNORMAL LOW (ref 12.0–15.0)
MCH: 21.4 pg — ABNORMAL LOW (ref 26.0–34.0)
MCHC: 29.4 g/dL — ABNORMAL LOW (ref 30.0–36.0)
MCV: 72.7 fL — ABNORMAL LOW (ref 80.0–100.0)
Platelets: 406 10*3/uL — ABNORMAL HIGH (ref 150–400)
RBC: 4.73 MIL/uL (ref 3.87–5.11)
RDW: 15.3 % (ref 11.5–15.5)
WBC: 15.2 10*3/uL — ABNORMAL HIGH (ref 4.0–10.5)
nRBC: 0 % (ref 0.0–0.2)

## 2019-06-09 LAB — GLUCOSE, CAPILLARY
Glucose-Capillary: 70 mg/dL (ref 70–99)
Glucose-Capillary: 74 mg/dL (ref 70–99)
Glucose-Capillary: 101 mg/dL — ABNORMAL HIGH (ref 70–99)
Glucose-Capillary: 181 mg/dL — ABNORMAL HIGH (ref 70–99)

## 2019-06-09 LAB — HEPATITIS PANEL, ACUTE
HCV Ab: <0.1 s/co ratio (ref 0.0–0.9)
Hep A IgM: NEGATIVE
Hep B C IgM: NEGATIVE
Hepatitis B Surface Ag: NEGATIVE

## 2019-06-09 LAB — AEROBIC/ANAEROBIC CULTURE W GRAM STAIN (SURGICAL/DEEP WOUND)
Culture: NO GROWTH
Special Requests: NORMAL

## 2019-06-09 LAB — MAGNESIUM: Magnesium: 1.6 mg/dL — ABNORMAL LOW (ref 1.7–2.4)

## 2019-06-09 LAB — HEPARIN LEVEL (UNFRACTIONATED): Heparin Unfractionated: 0.46 IU/mL (ref 0.30–0.70)

## 2019-06-09 MED ORDER — MORPHINE SULFATE (PF) 2 MG/ML IV SOLN
2.0000 mg | INTRAVENOUS | Status: DC | PRN
  Administered 2019-06-10: 2 mg via INTRAVENOUS
  Filled 2019-06-09: qty 1

## 2019-06-09 MED ORDER — MAGNESIUM SULFATE 2 GM/50ML IV SOLN
2.0000 g | Freq: Once | INTRAVENOUS | Status: AC
  Administered 2019-06-09: 2 g via INTRAVENOUS
  Filled 2019-06-09: qty 50

## 2019-06-09 MED ORDER — OXYCODONE HCL 5 MG PO TABS
10.0000 mg | ORAL_TABLET | ORAL | Status: DC | PRN
  Administered 2019-06-09 – 2019-06-10 (×6): 10 mg via ORAL
  Filled 2019-06-09 (×6): qty 2

## 2019-06-09 NOTE — Progress Notes (Signed)
   Subjective:  Patient reports pain as mild to moderate.  Denies f/ns/chills.  Denies numbness or paresthesias. Doing some better today.  Objective:   VITALS:   Vitals:   06/09/19 0440 06/09/19 0913 06/09/19 1512 06/09/19 2052  BP: (!) 144/56 (!) 110/50 (!) 141/65 (!) 150/74  Pulse: 96 82 95 94  Resp: 16 16 16 16   Temp: 100.2 F (37.9 C) 98.4 F (36.9 C) 99.9 F (37.7 C) 99.5 F (37.5 C)  TempSrc: Oral Oral Oral Oral  SpO2: 94% 97% 93% 92%  Weight:        Neurologically intact Neurovascular intact Sensation intact distally Intact pulses distally Incision: dressing C/D/I and no drainage Compartment soft No pain with AROM or PROm to the left shoulder or elbow, pain centered around biceps Dressing removed, mild drainage of blood and minimal purulence.  Penrose drains pulled  Lab Results  Component Value Date   WBC 15.2 (H) 06/09/2019   HGB 10.1 (L) 06/09/2019   HCT 34.4 (L) 06/09/2019   MCV 72.7 (L) 06/09/2019   PLT 406 (H) 06/09/2019   BMET    Component Value Date/Time   NA 136 06/09/2019 0346   NA 134 12/14/2018 0935   K 3.8 06/09/2019 0346   CL 102 06/09/2019 0346   CO2 22 06/09/2019 0346   GLUCOSE 58 (L) 06/09/2019 0346   BUN 9 06/09/2019 0346   BUN 15 12/14/2018 0935   CREATININE 1.45 (H) 06/09/2019 0346   CREATININE 1.27 (H) 05/22/2016 1424   CALCIUM 8.8 (L) 06/09/2019 0346   GFRNONAA 38 (L) 06/09/2019 0346   GFRNONAA 43 (L) 04/23/2016 0939   GFRAA 45 (L) 06/09/2019 0346   GFRAA 50 (L) 04/23/2016 0939     Assessment/Plan: 3 Days Post-Op   Principal Problem:   Left upper extremity swelling Active Problems:   Hyperlipidemia with target low density lipoprotein (LDL) cholesterol less than 70 mg/dL   Essential hypertension   Chronic diastolic CHF (congestive heart failure) (HCC)   History of DVT (deep vein thrombosis)   CAD (coronary artery disease)   Hypokalemia   COPD, mild (HCC)   - ok for ADLs with left arm - daily dry dressings,  selective staple removal to allow more drainage. - clinically improved with swelling and drainage.  Still pain, likley related to diffuse myositis and trauma to muscle.  Will follow, but no plan for repeat surgery at this time.  - at this time I feel like the scapular mass was an abscess and would not recommend IR biopsy.  Certainly repeat imaging after this episode will help direct need for that   - continue with activity as tolerated to the left arm    Nicholes Stairs 06/09/2019, 11:16 PM   Geralynn Rile, MD [phone]
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
## 2019-06-09 NOTE — Progress Notes (Signed)
ANTICOAGULATION CONSULT NOTE - Follow Up Consult  Pharmacy Consult for heparin Indication: history of DVT  Allergies  Allergen Reactions  . Lisinopril Hives    Patient Measurements: Weight: 145 lb 4.5 oz (65.9 kg) Heparin Dosing Weight: 65.9 kg  Vital Signs: Temp: 100.2 F (37.9 C) (07/16 0440) Temp Source: Oral (07/16 0440) BP: 144/56 (07/16 0440) Pulse Rate: 96 (07/16 0440)  Labs: Recent Labs    06/07/19 0334 06/07/19 1342 06/08/19 0329 06/09/19 0346  HGB 10.4*  --  9.1* 10.1*  HCT 34.5*  --  30.3* 34.4*  PLT 420*  --  380 406*  HEPARINUNFRC <0.10* 0.69 0.67 0.46  CREATININE 1.21*  --  1.31* 1.45*    Estimated Creatinine Clearance: 36.7 mL/min (A) (by C-G formula based on SCr of 1.45 mg/dL (H)).   Assessment: 64 yo female on Xarelto prior to admission for history of DVT. Patient initiated on heparin per pharmacy while Xarelto on hold for I&D. CBC stable. No signs of bleeding. Heparin level 0.46 at goal.  Goal of Therapy:  Heparin level 0.3-0.7 units/ml Monitor platelets by anticoagulation protocol: Yes   Plan:  Continue heparin 1150 units/hr  Monitor heparin level and CBC daily  Monitor S&S of bleeding  Tyeler Goedken A. Levada Dy, PharmD, [place] Please utilize Amion for appropriate phone number to reach the unit pharmacist ([place])    06/09/2019,7:31 AM

## 2019-06-10 LAB — CBC WITH DIFFERENTIAL/PLATELET
Abs Immature Granulocytes: 0.07 10*3/uL (ref 0.00–0.07)
Basophils Absolute: 0.1 10*3/uL (ref 0.0–0.1)
Basophils Relative: 0 %
Eosinophils Absolute: 0.2 10*3/uL (ref 0.0–0.5)
Eosinophils Relative: 1 %
HCT: 28.1 % — ABNORMAL LOW (ref 36.0–46.0)
Hemoglobin: 8.6 g/dL — ABNORMAL LOW (ref 12.0–15.0)
Immature Granulocytes: 1 %
Lymphocytes Relative: 22 %
Lymphs Abs: 3.2 10*3/uL (ref 0.7–4.0)
MCH: 21.6 pg — ABNORMAL LOW (ref 26.0–34.0)
MCHC: 30.6 g/dL (ref 30.0–36.0)
MCV: 70.6 fL — ABNORMAL LOW (ref 80.0–100.0)
Monocytes Absolute: 0.9 10*3/uL (ref 0.1–1.0)
Monocytes Relative: 6 %
Neutro Abs: 10.2 10*3/uL — ABNORMAL HIGH (ref 1.7–7.7)
Neutrophils Relative %: 70 %
Platelets: 409 10*3/uL — ABNORMAL HIGH (ref 150–400)
RBC: 3.98 MIL/uL (ref 3.87–5.11)
RDW: 15 % (ref 11.5–15.5)
WBC: 14.5 10*3/uL — ABNORMAL HIGH (ref 4.0–10.5)
nRBC: 0 % (ref 0.0–0.2)

## 2019-06-10 LAB — GLUCOSE, CAPILLARY
Glucose-Capillary: 160 mg/dL — ABNORMAL HIGH (ref 70–99)
Glucose-Capillary: 64 mg/dL — ABNORMAL LOW (ref 70–99)
Glucose-Capillary: 139 mg/dL — ABNORMAL HIGH (ref 70–99)
Glucose-Capillary: 113 mg/dL — ABNORMAL HIGH (ref 70–99)
Glucose-Capillary: 191 mg/dL — ABNORMAL HIGH (ref 70–99)

## 2019-06-10 LAB — COMPREHENSIVE METABOLIC PANEL
ALT: 108 U/L — ABNORMAL HIGH (ref 0–44)
AST: 90 U/L — ABNORMAL HIGH (ref 15–41)
Albumin: 1.9 g/dL — ABNORMAL LOW (ref 3.5–5.0)
Alkaline Phosphatase: 98 U/L (ref 38–126)
Anion gap: 9 (ref 5–15)
BUN: 14 mg/dL (ref 8–23)
CO2: 23 mmol/L (ref 22–32)
Calcium: 8.6 mg/dL — ABNORMAL LOW (ref 8.9–10.3)
Chloride: 102 mmol/L (ref 98–111)
Creatinine, Ser: 1.6 mg/dL — ABNORMAL HIGH (ref 0.44–1.00)
GFR calc Af Amer: 40 mL/min — ABNORMAL LOW (ref >60)
GFR calc non Af Amer: 34 mL/min — ABNORMAL LOW (ref >60)
Glucose, Bld: 71 mg/dL (ref 70–99)
Potassium: 3.8 mmol/L (ref 3.5–5.1)
Sodium: 134 mmol/L — ABNORMAL LOW (ref 135–145)
Total Bilirubin: 0.2 mg/dL — ABNORMAL LOW (ref 0.3–1.2)
Total Protein: 5.9 g/dL — ABNORMAL LOW (ref 6.5–8.1)

## 2019-06-10 LAB — HEPARIN LEVEL (UNFRACTIONATED)
Heparin Unfractionated: 0.29 IU/mL — ABNORMAL LOW (ref 0.30–0.70)
Heparin Unfractionated: 0.24 IU/mL — ABNORMAL LOW (ref 0.30–0.70)
Heparin Unfractionated: 0.44 IU/mL (ref 0.30–0.70)

## 2019-06-10 MED ORDER — INSULIN GLARGINE 100 UNIT/ML ~~LOC~~ SOLN
30.0000 [IU] | Freq: Every day | SUBCUTANEOUS | Status: DC
  Administered 2019-06-10: 30 [IU] via SUBCUTANEOUS
  Filled 2019-06-10 (×2): qty 0.3

## 2019-06-10 MED ORDER — AMOXICILLIN-POT CLAVULANATE 875-125 MG PO TABS
1.0000 | ORAL_TABLET | Freq: Two times a day (BID) | ORAL | Status: DC
  Administered 2019-06-10 – 2019-06-12 (×5): 1 via ORAL
  Filled 2019-06-10 (×5): qty 1

## 2019-06-10 MED ORDER — SODIUM CHLORIDE 0.9 % IV SOLN
INTRAVENOUS | Status: AC
  Administered 2019-06-10: 09:00:00 via INTRAVENOUS

## 2019-06-10 MED ORDER — OXYCODONE HCL 5 MG PO TABS
10.0000 mg | ORAL_TABLET | ORAL | Status: DC | PRN
  Administered 2019-06-10 – 2019-06-12 (×14): 10 mg via ORAL
  Filled 2019-06-10 (×15): qty 2

## 2019-06-10 NOTE — Progress Notes (Signed)
ANTICOAGULATION CONSULT NOTE - Follow Up Consult  Pharmacy Consult for heparin Indication: history of DVT  Allergies  Allergen Reactions  . Lisinopril Hives    Patient Measurements: Weight: 145 lb 4.5 oz (65.9 kg) Heparin Dosing Weight: 65.9 kg  Vital Signs: Temp: 98.6 F (37 C) (07/17 1550) Temp Source: Oral (07/17 1550) BP: 140/76 (07/17 1550) Pulse Rate: 87 (07/17 1550)  Labs: Recent Labs    06/08/19 0329 06/09/19 0346 06/10/19 0241 06/10/19 1506  HGB 9.1* 10.1* 8.6*  --   HCT 30.3* 34.4* 28.1*  --   PLT 380 406* 409*  --   HEPARINUNFRC 0.67 0.46 0.29* 0.24*  CREATININE 1.31* 1.45* 1.60*  --     Estimated Creatinine Clearance: 33.3 mL/min (A) (by C-G formula based on SCr of 1.6 mg/dL (H)).   Assessment: 63 yo female on Xarelto prior to admission for history of DVT. Patient initiated on heparin per pharmacy while Xarelto on hold for I&D. CBC stable. No signs of bleeding. Heparin level 0.29-below goal. No issue with heparin drip per RN, no bleeding noted Hgb 9.1>>10.1>>8.6. No repeat surgery planned per Ortho note.  Heparin level 0.24 (below goal)    Goal of Therapy:  Heparin level 0.3-0.7 units/ml Monitor platelets by anticoagulation protocol: Yes   Plan:  Increase heparin 1400 units/hr  HL in 6 hours F/u restart of xarelto Monitor heparin level and CBC daily  Monitor S&S of bleeding  Jossalin Chervenak A. Levada Dy, PharmD, [place] Please utilize Amion for appropriate phone number to reach the unit pharmacist ([place])    06/10/2019,3:53 PM

## 2019-06-10 NOTE — Progress Notes (Signed)
ANTICOAGULATION CONSULT NOTE - Follow Up Consult  Pharmacy Consult for heparin Indication: history of DVT  Allergies  Allergen Reactions  . Lisinopril Hives    Patient Measurements: Weight: 145 lb 4.5 oz (65.9 kg) Heparin Dosing Weight: 65.9 kg  Vital Signs: Temp: 98.4 F (36.9 C) (07/17 2047) Temp Source: Oral (07/17 2047) BP: 134/55 (07/17 2047) Pulse Rate: 83 (07/17 2047)  Labs: Recent Labs    06/08/19 0329 06/09/19 0346 06/10/19 0241 06/10/19 1506 06/10/19 2136  HGB 9.1* 10.1* 8.6*  --   --   HCT 30.3* 34.4* 28.1*  --   --   PLT 380 406* 409*  --   --   HEPARINUNFRC 0.67 0.46 0.29* 0.24* 0.44  CREATININE 1.31* 1.45* 1.60*  --   --     Estimated Creatinine Clearance: 33.3 mL/min (A) (by C-G formula based on SCr of 1.6 mg/dL (H)).   Assessment: 64 yo female on Xarelto prior to admission for history of DVT. Patient initiated on heparin per pharmacy while Xarelto on hold for I&D. CBC stable. No signs of bleeding. Heparin level 0.29-below goal. No issue with heparin drip per RN, no bleeding noted Hgb 9.1>>10.1>>8.6. No repeat surgery planned per Ortho note.  Heparin level is now therapeutic after increase to 1400 units/hr. No issues noted and no bleeding per RN.   Goal of Therapy:  Heparin level 0.3-0.7 units/ml Monitor platelets by anticoagulation protocol: Yes   Plan:  Continue heparin 1400 units/hr  F/u restart of xarelto Monitor heparin level and CBC daily  Monitor S&S of bleeding  Sloan Leiter, PharmD, BCPS, BCCCP Clinical Pharmacist Please refer to [facility] for [place] numbers 06/10/2019,10:26 PM

## 2019-06-10 NOTE — Care Management Important Message (Signed)
Important Message  Patient Details  Name: Alejandra Hernandez MRN: [id number] Date of Birth: 1956/02/17   Medicare Important Message Given:  Yes     Memory Argue 06/10/2019, 3:58 PM

## 2019-06-10 NOTE — Progress Notes (Signed)
ANTICOAGULATION CONSULT NOTE - Follow Up Consult  Pharmacy Consult for heparin Indication: history of DVT  Allergies  Allergen Reactions  . Lisinopril Hives    Patient Measurements: Weight: 145 lb 4.5 oz (65.9 kg) Heparin Dosing Weight: 65.9 kg  Vital Signs: Temp: 98.6 F (37 C) (07/17 0339) Temp Source: Oral (07/17 0339) BP: 140/48 (07/17 0339) Pulse Rate: 88 (07/17 0339)  Labs: Recent Labs    06/08/19 0329 06/09/19 0346 06/10/19 0241  HGB 9.1* 10.1* 8.6*  HCT 30.3* 34.4* 28.1*  PLT 380 406* 409*  HEPARINUNFRC 0.67 0.46 0.29*  CREATININE 1.31* 1.45* 1.60*    Estimated Creatinine Clearance: 33.3 mL/min (A) (by C-G formula based on SCr of 1.6 mg/dL (H)).   Assessment: 63 yo female on Xarelto prior to admission for history of DVT. Patient initiated on heparin per pharmacy while Xarelto on hold for I&D. CBC stable. No signs of bleeding. Heparin level 0.29-below goal. No issue with heparin drip per RN, no bleeding noted Hgb 9.1>>10.1>>8.6. No repeat surgery planned per Ortho note.    Goal of Therapy:  Heparin level 0.3-0.7 units/ml Monitor platelets by anticoagulation protocol: Yes   Plan:  Increase heparin 1250 units/hr  HL in 6 hours F/u restart of xarelto Monitor heparin level and CBC daily  Monitor S&S of bleeding  Sael Furches A. Levada Dy, PharmD, [place] Please utilize Amion for appropriate phone number to reach the unit pharmacist ([place])    06/10/2019,8:06 AM

## 2019-06-10 NOTE — Progress Notes (Signed)
Pt did not want me to call her contact person for an update.

## 2019-06-10 NOTE — Progress Notes (Signed)
Results for PRARTHANA, PARLIN (MRN [id number]) as of 06/10/2019 10:27  Ref. Range 06/09/2019 11:11 06/09/2019 15:47 06/09/2019 21:00 06/10/2019 07:34 06/10/2019 08:29  Glucose-Capillary Latest Ref Range: 70 - 99 mg/dL 74 101 (H) 181 (H) 64 (L) 160 (H)  Noted that fasting blood sugars have been less than 100 mg/dl. This am the CBG was 64 mg/dl.   Recommend decreasing Lantus to 60 units every HS if fasting blood sugars continue to be low.   Harvel Ricks RN BSN CDE Diabetes Coordinator Pager: [phone]  8am-5pm
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
## 2019-06-11 LAB — COMPREHENSIVE METABOLIC PANEL
ALT: 106 U/L — ABNORMAL HIGH (ref 0–44)
AST: 87 U/L — ABNORMAL HIGH (ref 15–41)
Albumin: 2 g/dL — ABNORMAL LOW (ref 3.5–5.0)
Alkaline Phosphatase: 114 U/L (ref 38–126)
Anion gap: 10 (ref 5–15)
BUN: 15 mg/dL (ref 8–23)
CO2: 22 mmol/L (ref 22–32)
Calcium: 8.9 mg/dL (ref 8.9–10.3)
Chloride: 106 mmol/L (ref 98–111)
Creatinine, Ser: 1.48 mg/dL — ABNORMAL HIGH (ref 0.44–1.00)
GFR calc Af Amer: 44 mL/min — ABNORMAL LOW (ref >60)
GFR calc non Af Amer: 38 mL/min — ABNORMAL LOW (ref >60)
Glucose, Bld: 48 mg/dL — ABNORMAL LOW (ref 70–99)
Potassium: 4 mmol/L (ref 3.5–5.1)
Sodium: 138 mmol/L (ref 135–145)
Total Bilirubin: 0.3 mg/dL (ref 0.3–1.2)
Total Protein: 6.3 g/dL — ABNORMAL LOW (ref 6.5–8.1)

## 2019-06-11 LAB — CBC WITH DIFFERENTIAL/PLATELET
Abs Immature Granulocytes: 0.08 10*3/uL — ABNORMAL HIGH (ref 0.00–0.07)
Basophils Absolute: 0.1 10*3/uL (ref 0.0–0.1)
Basophils Relative: 0 %
Eosinophils Absolute: 0.2 10*3/uL (ref 0.0–0.5)
Eosinophils Relative: 2 %
HCT: 28.8 % — ABNORMAL LOW (ref 36.0–46.0)
Hemoglobin: 8.6 g/dL — ABNORMAL LOW (ref 12.0–15.0)
Immature Granulocytes: 1 %
Lymphocytes Relative: 29 %
Lymphs Abs: 3.9 10*3/uL (ref 0.7–4.0)
MCH: 21.7 pg — ABNORMAL LOW (ref 26.0–34.0)
MCHC: 29.9 g/dL — ABNORMAL LOW (ref 30.0–36.0)
MCV: 72.5 fL — ABNORMAL LOW (ref 80.0–100.0)
Monocytes Absolute: 0.9 10*3/uL (ref 0.1–1.0)
Monocytes Relative: 6 %
Neutro Abs: 8.4 10*3/uL — ABNORMAL HIGH (ref 1.7–7.7)
Neutrophils Relative %: 62 %
Platelets: 501 10*3/uL — ABNORMAL HIGH (ref 150–400)
RBC: 3.97 MIL/uL (ref 3.87–5.11)
RDW: 15.3 % (ref 11.5–15.5)
WBC: 13.6 10*3/uL — ABNORMAL HIGH (ref 4.0–10.5)
nRBC: 0 % (ref 0.0–0.2)

## 2019-06-11 LAB — GLUCOSE, CAPILLARY
Glucose-Capillary: 38 mg/dL — CL (ref 70–99)
Glucose-Capillary: 71 mg/dL (ref 70–99)
Glucose-Capillary: 98 mg/dL (ref 70–99)
Glucose-Capillary: 72 mg/dL (ref 70–99)
Glucose-Capillary: 302 mg/dL — ABNORMAL HIGH (ref 70–99)

## 2019-06-11 LAB — HEPARIN LEVEL (UNFRACTIONATED): Heparin Unfractionated: 0.53 IU/mL (ref 0.30–0.70)

## 2019-06-11 MED ORDER — ASPIRIN 81 MG PO CHEW
81.0000 mg | CHEWABLE_TABLET | Freq: Every day | ORAL | Status: DC
  Administered 2019-06-11 – 2019-06-12 (×2): 81 mg via ORAL
  Filled 2019-06-11 (×2): qty 1

## 2019-06-11 MED ORDER — WHITE PETROLATUM EX OINT
TOPICAL_OINTMENT | CUTANEOUS | Status: AC
  Filled 2019-06-11: qty 28.35

## 2019-06-11 MED ORDER — INSULIN ASPART 100 UNIT/ML ~~LOC~~ SOLN
5.0000 [IU] | Freq: Once | SUBCUTANEOUS | Status: AC
  Administered 2019-06-11: 5 [IU] via SUBCUTANEOUS

## 2019-06-11 MED ORDER — FERROUS SULFATE 325 (65 FE) MG PO TABS
325.0000 mg | ORAL_TABLET | Freq: Every day | ORAL | Status: DC
  Administered 2019-06-12: 325 mg via ORAL
  Filled 2019-06-11: qty 1

## 2019-06-11 NOTE — Progress Notes (Signed)
ANTICOAGULATION CONSULT NOTE - Follow Up Consult  Pharmacy Consult for heparin Indication: history of DVT  Allergies  Allergen Reactions  . Lisinopril Hives    Patient Measurements: Weight: 145 lb 4.5 oz (65.9 kg)  Height: 5\' 3"  Heparin Dosing Weight: 65.9 kg  Vital Signs: Temp: 98.6 F (37 C) (07/18 0804) Temp Source: Oral (07/18 0804) BP: 157/56 (07/18 0804) Pulse Rate: 77 (07/18 0804)  Labs: Recent Labs    06/09/19 0346 06/10/19 0241 06/10/19 1506 06/10/19 2136 06/11/19 0333  HGB 10.1* 8.6*  --   --  8.6*  HCT 34.4* 28.1*  --   --  28.8*  PLT 406* 409*  --   --  501*  HEPARINUNFRC 0.46 0.29* 0.24* 0.44 0.53  CREATININE 1.45* 1.60*  --   --  1.48*    Estimated Creatinine Clearance: 36 mL/min (A) (by C-G formula based on SCr of 1.48 mg/dL (H)).   Assessment: 63 yo female on Xarelto prior to admission for history of DVT. Patient initiated on heparin per pharmacy while Xarelto on hold for I&D of left arm and shoulder, which was done on 7/13. No repeat surgery planned per Ortho note.  AM heparin level therapeutic at 0.53 units/mL. Hgb 8.6, Pltc elevated. No bleeding or infusion issues noted per nursing.    Goal of Therapy:  Heparin level 0.3-0.7 units/ml Monitor platelets by anticoagulation protocol: Yes   Plan:  Continue heparin infusion at 1400 units/hr  Daily CBC and heparin level  Monitor closely for s/sx of bleeding F/u resumption of Xarelto      Lindell Spar, PharmD, BCPS Clinical Pharmacist Please refer to [facility] for [place] numbers 06/11/2019,8:21 AM

## 2019-06-11 NOTE — Plan of Care (Signed)
  Problem: Skin Integrity: Goal: Risk for impaired skin integrity will decrease Outcome: Progressing   Problem: Pain Managment: Goal: General experience of comfort will improve Outcome: Progressing   

## 2019-06-11 NOTE — Progress Notes (Signed)
Hypoglycemic Event  CBG: 38 @0746   Treatment: 8 oz juice/soda  Symptoms: Shaky  Follow-up CBG: FEXM:[id number] CBG Result:71  Possible Reasons for Event: Unknown  Comments/MD notified:Message sent to Dr Manus Gunning Karin Golden
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
## 2019-06-11 NOTE — Progress Notes (Signed)
Notified Dr Silas [place], [facility] of pt's CBG 302 and there is no PM SS coverage ordered. Will continue to monitor.

## 2019-06-11 NOTE — Progress Notes (Signed)
I spoke to Dr. Victorino December about Alejandra Hernandez and since she was feeling better on Friday and her labs were improving, he recommended outpatient follow up. She is stable for discharge from an orthopedic standpoint. I have paged the hospitalist and discussed plan with the nursing staff.
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
## 2019-06-12 LAB — CBC
HCT: 25.2 % — ABNORMAL LOW (ref 36.0–46.0)
Hemoglobin: 7.6 g/dL — ABNORMAL LOW (ref 12.0–15.0)
MCH: 21.8 pg — ABNORMAL LOW (ref 26.0–34.0)
MCHC: 30.2 g/dL (ref 30.0–36.0)
MCV: 72.4 fL — ABNORMAL LOW (ref 80.0–100.0)
Platelets: 460 10*3/uL — ABNORMAL HIGH (ref 150–400)
RBC: 3.48 MIL/uL — ABNORMAL LOW (ref 3.87–5.11)
RDW: 15.4 % (ref 11.5–15.5)
WBC: 11.8 10*3/uL — ABNORMAL HIGH (ref 4.0–10.5)
nRBC: 0 % (ref 0.0–0.2)

## 2019-06-12 LAB — GLUCOSE, CAPILLARY
Glucose-Capillary: 123 mg/dL — ABNORMAL HIGH (ref 70–99)
Glucose-Capillary: 270 mg/dL — ABNORMAL HIGH (ref 70–99)
Glucose-Capillary: 47 mg/dL — ABNORMAL LOW (ref 70–99)
Glucose-Capillary: 67 mg/dL — ABNORMAL LOW (ref 70–99)

## 2019-06-12 LAB — HEPARIN LEVEL (UNFRACTIONATED): Heparin Unfractionated: 0.74 IU/mL — ABNORMAL HIGH (ref 0.30–0.70)

## 2019-06-12 MED ORDER — AMOXICILLIN-POT CLAVULANATE 875-125 MG PO TABS: 1.0000 | ORAL_TABLET | Freq: Two times a day (BID) | ORAL | 0 refills | Status: AC

## 2019-06-12 NOTE — Progress Notes (Signed)
Pt stated she felt sick and nervous. CBG checked and result was 47. Patient given 2 cups orange juice, 240 mL, graham crackers x 1 pack and peanut butter x 1 pack. Will recheck CBG in 15 minute and continue to monitor patient.

## 2019-06-12 NOTE — Plan of Care (Signed)
  Problem: Pain Managment: Goal: General experience of comfort will improve Outcome: Progressing   Problem: Safety: Goal: Ability to remain free from injury will improve Outcome: Progressing   Problem: Skin Integrity: Goal: Risk for impaired skin integrity will decrease Outcome: Progressing   

## 2019-06-12 NOTE — Discharge Summary (Signed)
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 section 360bbb-3(b)(1), unless the authorization is terminated or revoked sooner. Performed at [facility], [place] [address] [place], [place] [address]   Blood culture (routine x 2)     Status: None   Collection Time: 06/03/19  8:15 PM   Specimen: BLOOD RIGHT FOREARM  Result Value Ref Range Status   Specimen Description BLOOD RIGHT FOREARM  Final   Special Requests   Final    BOTTLES DRAWN AEROBIC AND ANAEROBIC Blood Culture adequate volume   Culture   Final    NO GROWTH 5 DAYS Performed at [facility], [address] [place], [place] [address]    Report Status 06/08/2019 FINAL  Final  Blood culture (routine x 2)     Status: None   Collection Time: 06/03/19  8:32 PM   Specimen: BLOOD  Result Value Ref Range Status   Specimen Description BLOOD BLOOD RIGHT WRIST  Final   Special Requests   Final    BOTTLES DRAWN AEROBIC ONLY Blood  Culture adequate volume   Culture   Final    NO GROWTH 5 DAYS Performed at [facility], [address], [place], [place] [address]    Report Status 06/08/2019 FINAL  Final  MRSA PCR Screening     Status: None   Collection Time: 06/04/19 12:07 AM   Specimen: Nasal Mucosa; Nasopharyngeal  Result Value Ref Range Status   MRSA by PCR NEGATIVE NEGATIVE Final    Comment:        The GeneXpert MRSA Assay (FDA approved for NASAL specimens only), is one component of a comprehensive MRSA colonization surveillance program. It is not intended to diagnose MRSA infection nor to guide or monitor treatment for MRSA infections. Performed at [facility], [place] [address], [place], [place] [address]   Aerobic/Anaerobic Culture (surgical/deep wound)     Status: None   Collection Time: 06/04/19  1:16 PM   Specimen: Abscess  Result Value Ref Range Status   Specimen Description ABSCESS LEFT ARM  Final   Special Requests Normal  Final   Gram Stain   Final    ABUNDANT WBC PRESENT, PREDOMINANTLY PMN NO ORGANISMS SEEN    Culture   Final    No growth aerobically or anaerobically. Performed at [facility], [place] [address] [place], [place] [address]    Report Status 06/09/2019 FINAL  Final  Culture, blood (routine x 2)     Status: None (Preliminary result)   Collection Time: 06/09/19  8:47 AM   Specimen: BLOOD RIGHT HAND  Result Value Ref Range Status   Specimen Description BLOOD RIGHT HAND  Final   Special Requests   Final    BOTTLES DRAWN AEROBIC ONLY Blood Culture results may not be optimal due to an inadequate volume of blood received in culture bottles   Culture   Final    NO GROWTH 2 DAYS Performed at [facility], [place] [address], [place], [place] [address]    Report Status PENDING  Incomplete  Culture, blood (routine x 2)     Status: None (Preliminary result)   Collection Time: 06/09/19  8:50 AM   Specimen: BLOOD RIGHT HAND  Result Value Ref Range Status    Specimen Description BLOOD RIGHT HAND  Final   Special Requests   Final    BOTTLES DRAWN AEROBIC ONLY Blood Culture results may not be optimal due to an inadequate volume of blood received in culture bottles   Culture   Final    NO GROWTH 2 DAYS Performed at [facility], [place] [address] [place], [place] [address]    Report Status PENDING  Incomplete     Labs: BNP (last 3 results) Recent Labs    11/16/18 1936 01/16/19 1348  BNP 106.4* 58.8   Basic Metabolic Panel: Recent Labs  Lab 06/06/19 0603 06/07/19 0334 06/08/19 0329 06/09/19 0346 06/10/19 0241 06/11/19 0333  NA 138 135 139 136 134* 138  K 3.4* 3.7 3.8 3.8 3.8 4.0  CL 105 103 106 102 102 106  CO2 21* 19* 20* 22  23 22  GLUCOSE 91 196* 58* 58* 71 48*  BUN 7* 8 10 9 14 15   CREATININE 1.29* 1.21* 1.31* 1.45* 1.60* 1.48*  CALCIUM 8.5* 8.8* 8.5* 8.8* 8.6* 8.9  MG 1.3* 2.1 1.6* 1.6*  --   --    Liver Function Tests: Recent Labs  Lab 06/07/19 0334 06/08/19 0329 06/09/19 0346 06/10/19 0241 06/11/19 0333  AST 107* 135* 154* 90* 87*  ALT 93* 104* 143* 108* 106*  ALKPHOS 105 92 115 98 114  BILITOT 0.5 0.2* 0.5 0.2* 0.3  PROT 6.8 6.0* 6.7 5.9* 6.3*  ALBUMIN 2.3* 1.9* 2.1* 1.9* 2.0*   No results for input(s): LIPASE, AMYLASE in the last 168 hours. No results for input(s): AMMONIA in the last 168 hours. CBC: Recent Labs  Lab 06/08/19 0329 06/09/19 0346 06/10/19 0241 06/11/19 0333 06/12/19 0306  WBC 12.3* 15.2* 14.5* 13.6* 11.8*  NEUTROABS  --   --  10.2* 8.4*  --   HGB 9.1* 10.1* 8.6* 8.6* 7.6*  HCT 30.3* 34.4* 28.1* 28.8* 25.2*  MCV 71.3* 72.7* 70.6* 72.5* 72.4*  PLT 380 406* 409* 501* 460*   Cardiac Enzymes: No results for input(s): CKTOTAL, CKMB, CKMBINDEX, TROPONINI in the last 168 hours. BNP: Invalid input(s): POCBNP CBG: Recent Labs  Lab 06/11/19 1647 06/11/19 2024 06/12/19 0034 06/12/19 0111 06/12/19 0806  GLUCAP 72 302* 47* 123* 270*   D-Dimer No results for input(s): DDIMER in  the last 72 hours. Hgb A1c No results for input(s): HGBA1C in the last 72 hours. Lipid Profile No results for input(s): CHOL, HDL, LDLCALC, TRIG, CHOLHDL, LDLDIRECT in the last 72 hours. Thyroid function studies No results for input(s): TSH, T4TOTAL, T3FREE, THYROIDAB in the last 72 hours.  Invalid input(s): FREET3 Anemia work up No results for input(s): VITAMINB12, FOLATE, FERRITIN, TIBC, IRON, RETICCTPCT in the last 72 hours. Urinalysis    Component Value Date/Time   COLORURINE STRAW (A) 06/03/2019 1938   APPEARANCEUR CLEAR 06/03/2019 1938   LABSPEC 1.005 06/03/2019 1938   PHURINE 7.0 06/03/2019 1938   GLUCOSEU >=500 (A) 06/03/2019 1938   HGBUR SMALL (A) 06/03/2019 1938   BILIRUBINUR NEGATIVE 06/03/2019 1938   KETONESUR NEGATIVE 06/03/2019 1938   PROTEINUR 100 (A) 06/03/2019 1938   UROBILINOGEN 0.2 10/08/2018 1333   NITRITE NEGATIVE 06/03/2019 1938   LEUKOCYTESUR NEGATIVE 06/03/2019 1938   Sepsis Labs Invalid input(s): PROCALCITONIN,  WBC,  LACTICIDVEN Microbiology Recent Results (from the past 240 hour(s))  SARS Coronavirus 2 (CEPHEID - Performed in [place] hospital lab), Hosp Order     Status: None   Collection Time: 06/03/19  4:10 PM   Specimen: Nasopharyngeal Swab  Result Value Ref Range Status   SARS Coronavirus 2 NEGATIVE NEGATIVE Final    Comment: (NOTE) If result is NEGATIVE SARS-CoV-2 target nucleic acids are NOT DETECTED. The SARS-CoV-2 RNA is generally detectable in upper and lower  respiratory specimens during the acute phase of infection. The lowest  concentration of SARS-CoV-2 viral copies this assay can detect is 250  copies / mL. A negative result does not preclude SARS-CoV-2 infection  and should not be used as the sole basis for treatment or other  patient management decisions.  A negative result may occur with  improper specimen collection / handling, submission of specimen other  than nasopharyngeal swab, presence of viral mutation(s) within the   areas targeted by this assay, and inadequate number of viral copies  (<250 copies / mL). A negative result must be combined with clinical  observations,  patient history, and epidemiological information. If result is POSITIVE SARS-CoV-2 target nucleic acids are DETECTED. The SARS-CoV-2 RNA is generally detectable in upper and lower  respiratory specimens dur ing the acute phase of infection.  Positive  results are indicative of active infection with SARS-CoV-2.  Clinical  correlation with patient history and other diagnostic information is  necessary to determine patient infection status.  Positive results do  not rule out bacterial infection or co-infection with other viruses. If result is PRESUMPTIVE POSTIVE SARS-CoV-2 nucleic acids MAY BE PRESENT.   A presumptive positive result was obtained on the submitted specimen  and confirmed on repeat testing.  While 2019 novel coronavirus  (SARS-CoV-2) nucleic acids may be present in the submitted sample  additional confirmatory testing may be necessary for epidemiological  and / or clinical management purposes  to differentiate between  SARS-CoV-2 and other Sarbecovirus currently known to infect humans.  If clinically indicated additional testing with an alternate test  methodology [phone]) is advised. The SARS-CoV-2 RNA is generally  detectable in upper and lower respiratory sp ecimens during the acute  phase of infection. The expected result is Negative. Fact Sheet for Patients:  StrictlyIdeas.no Fact Sheet for Healthcare Providers: BankingDealers.co.za This test is not yet approved or cleared by the [place] FDA and has been authorized for detection and/or diagnosis of SARS-CoV-2 by FDA under an Emergency Use Authorization (EUA).  This EUA will remain in effect (meaning this test can be used) for the duration of the COVID-19 declaration under Section 564(b)(1) of the Act, 21  U.S.C. section 360bbb-3(b)(1), unless the authorization is terminated or revoked sooner. Performed at [facility], [place] [address] [place], [place] [address]   Blood culture (routine x 2)     Status: None   Collection Time: 06/03/19  8:15 PM   Specimen: BLOOD RIGHT FOREARM  Result Value Ref Range Status   Specimen Description BLOOD RIGHT FOREARM  Final   Special Requests   Final    BOTTLES DRAWN AEROBIC AND ANAEROBIC Blood Culture adequate volume   Culture   Final    NO GROWTH 5 DAYS Performed at [facility], [place] [address] [place], [place] [address]    Report Status 06/08/2019 FINAL  Final  Blood culture (routine x 2)     Status: None   Collection Time: 06/03/19  8:32 PM   Specimen: BLOOD  Result Value Ref Range Status   Specimen Description BLOOD BLOOD RIGHT WRIST  Final   Special Requests   Final    BOTTLES DRAWN AEROBIC ONLY Blood Culture adequate volume   Culture   Final    NO GROWTH 5 DAYS Performed at [facility], [place] [address], [place], [place] [address]    Report Status 06/08/2019 FINAL  Final  MRSA PCR Screening     Status: None   Collection Time: 06/04/19 12:07 AM   Specimen: Nasal Mucosa; Nasopharyngeal  Result Value Ref Range Status   MRSA by PCR NEGATIVE NEGATIVE Final    Comment:        The GeneXpert MRSA Assay (FDA approved for NASAL specimens only), is one component of a comprehensive MRSA colonization surveillance program. It is not intended to diagnose MRSA infection nor to guide or monitor treatment for MRSA infections. Performed at [facility], [place] [address], [place],  [address]   Aerobic/Anaerobic Culture (surgical/deep wound)     Status: None   Collection Time: 06/04/19  1:16 PM  Specimen: Abscess  Result Value Ref Range Status   Specimen Description ABSCESS LEFT ARM  Final   Special Requests Normal  Final   Gram Stain   Final    ABUNDANT WBC PRESENT, PREDOMINANTLY PMN NO ORGANISMS SEEN    Culture    Final    No growth aerobically or anaerobically. Performed at [facility], [place] [address] [place], [place] [address]    Report Status 06/09/2019 FINAL  Final  Culture, blood (routine x 2)     Status: None (Preliminary result)   Collection Time: 06/09/19  8:47 AM   Specimen: BLOOD RIGHT HAND  Result Value Ref Range Status   Specimen Description BLOOD RIGHT HAND  Final   Special Requests   Final    BOTTLES DRAWN AEROBIC ONLY Blood Culture results may not be optimal due to an inadequate volume of blood received in culture bottles   Culture   Final    NO GROWTH 2 DAYS Performed at [facility], [place] [address] [place], [place] [address]    Report Status PENDING  Incomplete  Culture, blood (routine x 2)     Status: None (Preliminary result)   Collection Time: 06/09/19  8:50 AM   Specimen: BLOOD RIGHT HAND  Result Value Ref Range Status   Specimen Description BLOOD RIGHT HAND  Final   Special Requests   Final    BOTTLES DRAWN AEROBIC ONLY Blood Culture results may not be optimal due to an inadequate volume of blood received in culture bottles   Culture   Final    NO GROWTH 2 DAYS Performed at [facility], [place] [address], [place], [place] [address]    Report Status PENDING  Incomplete    Please note: You were cared for by a hospitalist during your hospital stay. Once you are discharged, your primary care physician will handle any further medical issues. Please note that NO REFILLS for any discharge medications will be authorized once you are discharged, as it is imperative that you return to your primary care physician (or establish a relationship with a primary care physician if you do not have one) for your post hospital discharge needs so that they can reassess your need for medications and monitor your lab values.    Time coordinating discharge: 40 minutes  SIGNED:   Shelly Coss, MD  [facility] 06/12/2019, 9:39 AM Pager [phone]  If 7PM-7AM,  please contact night-coverage [email or web address] Password [email or web address]

## 2019-06-12 NOTE — Care Management (Signed)
OPPT referral sent and placed on AVS.

## 2019-06-12 NOTE — Progress Notes (Signed)
Discharge education / instructions addressed.

## 2019-06-12 NOTE — Progress Notes (Signed)
Pt alert and oriented x4; not in distress; called cab for transport to home.

## 2019-06-14 LAB — CULTURE, BLOOD (ROUTINE X 2)
Culture: NO GROWTH
Culture: NO GROWTH

## 2019-06-15 ENCOUNTER — Other Ambulatory Visit: Payer: Self-pay | Admitting: Cardiology

## 2019-06-15 NOTE — Telephone Encounter (Signed)
Xarelto 20mg  refill request received; pt is 63 yrs old, wt-62.1kg, Crea-1.48 on 06/11/2019, last Telemedicine visit with Dr. Fletcher Anon on 05/24/2019 and Estella Husk on 05/17/2019, Diagnosis Bilateral DVT, med last filled by on 06/08/18 by Altha Harm RN, pt's CrCl is 38.23ml/min-which prompts a dose change per dosing criteria. Also, per Dr. 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 Will defer them. Called their office and let them know that she needs a refill on her Xarelto. Also, notified the pharmacy so the can update their information. She will send to them at this moment.

## 2019-06-29 ENCOUNTER — Other Ambulatory Visit: Payer: Self-pay | Admitting: Cardiovascular Disease

## 2019-06-29 NOTE — Telephone Encounter (Signed)
Refill Request.  

## 2019-07-05 ENCOUNTER — Other Ambulatory Visit: Payer: Self-pay | Admitting: Endocrinology

## 2019-07-10 ENCOUNTER — Encounter (HOSPITAL_COMMUNITY): Payer: Self-pay | Admitting: Oncology

## 2019-07-10 ENCOUNTER — Inpatient Hospital Stay (HOSPITAL_COMMUNITY)
Admission: EM | Admit: 2019-07-10 | Discharge: 2019-07-12 | DRG: 303 | Disposition: A | Discharge status: Home / Self Care | Payer: Medicare Other | Attending: Internal Medicine | Admitting: Internal Medicine

## 2019-07-10 ENCOUNTER — Emergency Department (HOSPITAL_COMMUNITY): Payer: Medicare Other

## 2019-07-10 ENCOUNTER — Other Ambulatory Visit: Payer: Self-pay

## 2019-07-10 DIAGNOSIS — M109 Gout, unspecified: Secondary | ICD-10-CM | POA: Diagnosis present

## 2019-07-10 DIAGNOSIS — N183 Chronic kidney disease, stage 3 unspecified: Secondary | ICD-10-CM | POA: Diagnosis present

## 2019-07-10 DIAGNOSIS — M19072 Primary osteoarthritis, left ankle and foot: Secondary | ICD-10-CM | POA: Diagnosis present

## 2019-07-10 DIAGNOSIS — I25110 Atherosclerotic heart disease of native coronary artery with unstable angina pectoris: Secondary | ICD-10-CM | POA: Diagnosis not present

## 2019-07-10 DIAGNOSIS — M19071 Primary osteoarthritis, right ankle and foot: Secondary | ICD-10-CM | POA: Diagnosis present

## 2019-07-10 DIAGNOSIS — R7989 Other specified abnormal findings of blood chemistry: Secondary | ICD-10-CM | POA: Diagnosis present

## 2019-07-10 DIAGNOSIS — R52 Pain, unspecified: Secondary | ICD-10-CM | POA: Insufficient documentation

## 2019-07-10 DIAGNOSIS — E1142 Type 2 diabetes mellitus with diabetic polyneuropathy: Secondary | ICD-10-CM | POA: Diagnosis present

## 2019-07-10 DIAGNOSIS — Z794 Long term (current) use of insulin: Secondary | ICD-10-CM

## 2019-07-10 DIAGNOSIS — Z20828 Contact with and (suspected) exposure to other viral communicable diseases: Secondary | ICD-10-CM | POA: Diagnosis present

## 2019-07-10 DIAGNOSIS — Z8249 Family history of ischemic heart disease and other diseases of the circulatory system: Secondary | ICD-10-CM

## 2019-07-10 DIAGNOSIS — R079 Chest pain, unspecified: Secondary | ICD-10-CM | POA: Diagnosis present

## 2019-07-10 DIAGNOSIS — Z86718 Personal history of other venous thrombosis and embolism: Secondary | ICD-10-CM

## 2019-07-10 DIAGNOSIS — E860 Dehydration: Secondary | ICD-10-CM | POA: Diagnosis present

## 2019-07-10 DIAGNOSIS — D509 Iron deficiency anemia, unspecified: Secondary | ICD-10-CM | POA: Diagnosis present

## 2019-07-10 DIAGNOSIS — IMO0002 Reserved for concepts with insufficient information to code with codable children: Secondary | ICD-10-CM

## 2019-07-10 DIAGNOSIS — E119 Type 2 diabetes mellitus without complications: Secondary | ICD-10-CM

## 2019-07-10 DIAGNOSIS — I10 Essential (primary) hypertension: Secondary | ICD-10-CM | POA: Diagnosis present

## 2019-07-10 DIAGNOSIS — I252 Old myocardial infarction: Secondary | ICD-10-CM

## 2019-07-10 DIAGNOSIS — D649 Anemia, unspecified: Secondary | ICD-10-CM

## 2019-07-10 DIAGNOSIS — E1122 Type 2 diabetes mellitus with diabetic chronic kidney disease: Secondary | ICD-10-CM | POA: Diagnosis present

## 2019-07-10 DIAGNOSIS — R778 Other specified abnormalities of plasma proteins: Secondary | ICD-10-CM | POA: Diagnosis present

## 2019-07-10 DIAGNOSIS — J449 Chronic obstructive pulmonary disease, unspecified: Secondary | ICD-10-CM | POA: Diagnosis not present

## 2019-07-10 DIAGNOSIS — Z841 Family history of disorders of kidney and ureter: Secondary | ICD-10-CM

## 2019-07-10 DIAGNOSIS — Z79899 Other long term (current) drug therapy: Secondary | ICD-10-CM

## 2019-07-10 DIAGNOSIS — Z7982 Long term (current) use of aspirin: Secondary | ICD-10-CM

## 2019-07-10 DIAGNOSIS — I739 Peripheral vascular disease, unspecified: Secondary | ICD-10-CM | POA: Diagnosis present

## 2019-07-10 DIAGNOSIS — I5032 Chronic diastolic (congestive) heart failure: Secondary | ICD-10-CM | POA: Diagnosis present

## 2019-07-10 DIAGNOSIS — E785 Hyperlipidemia, unspecified: Secondary | ICD-10-CM | POA: Diagnosis present

## 2019-07-10 DIAGNOSIS — M545 Low back pain: Secondary | ICD-10-CM | POA: Diagnosis present

## 2019-07-10 DIAGNOSIS — Z87891 Personal history of nicotine dependence: Secondary | ICD-10-CM

## 2019-07-10 DIAGNOSIS — M1711 Unilateral primary osteoarthritis, right knee: Secondary | ICD-10-CM | POA: Diagnosis present

## 2019-07-10 DIAGNOSIS — Z7901 Long term (current) use of anticoagulants: Secondary | ICD-10-CM

## 2019-07-10 DIAGNOSIS — N179 Acute kidney failure, unspecified: Secondary | ICD-10-CM | POA: Diagnosis present

## 2019-07-10 DIAGNOSIS — M479 Spondylosis, unspecified: Secondary | ICD-10-CM | POA: Diagnosis present

## 2019-07-10 DIAGNOSIS — Z9861 Coronary angioplasty status: Secondary | ICD-10-CM

## 2019-07-10 DIAGNOSIS — Z833 Family history of diabetes mellitus: Secondary | ICD-10-CM

## 2019-07-10 DIAGNOSIS — E1165 Type 2 diabetes mellitus with hyperglycemia: Secondary | ICD-10-CM

## 2019-07-10 DIAGNOSIS — E871 Hypo-osmolality and hyponatremia: Secondary | ICD-10-CM | POA: Diagnosis present

## 2019-07-10 DIAGNOSIS — I160 Hypertensive urgency: Secondary | ICD-10-CM | POA: Diagnosis present

## 2019-07-10 DIAGNOSIS — Z888 Allergy status to other drugs, medicaments and biological substances status: Secondary | ICD-10-CM

## 2019-07-10 DIAGNOSIS — K219 Gastro-esophageal reflux disease without esophagitis: Secondary | ICD-10-CM | POA: Diagnosis present

## 2019-07-10 DIAGNOSIS — M19041 Primary osteoarthritis, right hand: Secondary | ICD-10-CM | POA: Diagnosis present

## 2019-07-10 DIAGNOSIS — R251 Tremor, unspecified: Secondary | ICD-10-CM | POA: Diagnosis present

## 2019-07-10 DIAGNOSIS — E11649 Type 2 diabetes mellitus with hypoglycemia without coma: Secondary | ICD-10-CM | POA: Diagnosis not present

## 2019-07-10 DIAGNOSIS — E1151 Type 2 diabetes mellitus with diabetic peripheral angiopathy without gangrene: Secondary | ICD-10-CM | POA: Diagnosis present

## 2019-07-10 DIAGNOSIS — I130 Hypertensive heart and chronic kidney disease with heart failure and stage 1 through stage 4 chronic kidney disease, or unspecified chronic kidney disease: Secondary | ICD-10-CM | POA: Diagnosis present

## 2019-07-10 DIAGNOSIS — M19042 Primary osteoarthritis, left hand: Secondary | ICD-10-CM | POA: Diagnosis present

## 2019-07-10 DIAGNOSIS — I493 Ventricular premature depolarization: Secondary | ICD-10-CM | POA: Diagnosis present

## 2019-07-10 LAB — CBC WITH DIFFERENTIAL/PLATELET
Abs Immature Granulocytes: 0.03 10*3/uL (ref 0.00–0.07)
Basophils Absolute: 0.1 10*3/uL (ref 0.0–0.1)
Basophils Relative: 1 %
Eosinophils Absolute: 0.1 10*3/uL (ref 0.0–0.5)
Eosinophils Relative: 1 %
HCT: 35.2 % — ABNORMAL LOW (ref 36.0–46.0)
Hemoglobin: 10.7 g/dL — ABNORMAL LOW (ref 12.0–15.0)
Immature Granulocytes: 0 %
Lymphocytes Relative: 40 %
Lymphs Abs: 3.7 10*3/uL (ref 0.7–4.0)
MCH: 21.5 pg — ABNORMAL LOW (ref 26.0–34.0)
MCHC: 30.4 g/dL (ref 30.0–36.0)
MCV: 70.8 fL — ABNORMAL LOW (ref 80.0–100.0)
Monocytes Absolute: 0.5 10*3/uL (ref 0.1–1.0)
Monocytes Relative: 6 %
Neutro Abs: 4.9 10*3/uL (ref 1.7–7.7)
Neutrophils Relative %: 52 %
Platelets: 327 10*3/uL (ref 150–400)
RBC: 4.97 MIL/uL (ref 3.87–5.11)
RDW: 17 % — ABNORMAL HIGH (ref 11.5–15.5)
WBC: 9.3 10*3/uL (ref 4.0–10.5)
nRBC: 0 % (ref 0.0–0.2)

## 2019-07-10 LAB — TROPONIN I (HIGH SENSITIVITY)
Troponin I (High Sensitivity): 17 ng/L (ref <18)
Troponin I (High Sensitivity): 18 ng/L — ABNORMAL HIGH (ref <18)
Troponin I (High Sensitivity): 23 ng/L — ABNORMAL HIGH (ref <18)

## 2019-07-10 LAB — BRAIN NATRIURETIC PEPTIDE: B Natriuretic Peptide: 74.3 pg/mL (ref 0.0–100.0)

## 2019-07-10 LAB — COMPREHENSIVE METABOLIC PANEL
ALT: 15 U/L (ref 0–44)
AST: 17 U/L (ref 15–41)
Albumin: 4.1 g/dL (ref 3.5–5.0)
Alkaline Phosphatase: 124 U/L (ref 38–126)
Anion gap: 15 (ref 5–15)
BUN: 11 mg/dL (ref 8–23)
CO2: 18 mmol/L — ABNORMAL LOW (ref 22–32)
Calcium: 9.7 mg/dL (ref 8.9–10.3)
Chloride: 99 mmol/L (ref 98–111)
Creatinine, Ser: 1.03 mg/dL — ABNORMAL HIGH (ref 0.44–1.00)
GFR calc Af Amer: >60 mL/min (ref >60)
GFR calc non Af Amer: 58 mL/min — ABNORMAL LOW (ref >60)
Glucose, Bld: 205 mg/dL — ABNORMAL HIGH (ref 70–99)
Potassium: 3.8 mmol/L (ref 3.5–5.1)
Sodium: 132 mmol/L — ABNORMAL LOW (ref 135–145)
Total Bilirubin: 1 mg/dL (ref 0.3–1.2)
Total Protein: 8 g/dL (ref 6.5–8.1)

## 2019-07-10 LAB — C-REACTIVE PROTEIN: CRP: <0.8 mg/dL (ref <1.0)

## 2019-07-10 LAB — GLUCOSE, CAPILLARY
Glucose-Capillary: 215 mg/dL — ABNORMAL HIGH (ref 70–99)
Glucose-Capillary: 123 mg/dL — ABNORMAL HIGH (ref 70–99)
Glucose-Capillary: 294 mg/dL — ABNORMAL HIGH (ref 70–99)
Glucose-Capillary: 132 mg/dL — ABNORMAL HIGH (ref 70–99)
Glucose-Capillary: 154 mg/dL — ABNORMAL HIGH (ref 70–99)

## 2019-07-10 LAB — URINALYSIS, ROUTINE W REFLEX MICROSCOPIC
Bacteria, UA: NONE SEEN
Bilirubin Urine: NEGATIVE
Glucose, UA: 50 mg/dL — AB
Hgb urine dipstick: NEGATIVE
Ketones, ur: NEGATIVE mg/dL
Leukocytes,Ua: NEGATIVE
Nitrite: NEGATIVE
Protein, ur: >=300 mg/dL — AB
Specific Gravity, Urine: 1.004 — ABNORMAL LOW (ref 1.005–1.030)
pH: 7 (ref 5.0–8.0)

## 2019-07-10 LAB — PROTIME-INR
INR: 2.5 — ABNORMAL HIGH (ref 0.8–1.2)
Prothrombin Time: 26.8 seconds — ABNORMAL HIGH (ref 11.4–15.2)

## 2019-07-10 LAB — FERRITIN: Ferritin: 382 ng/mL — ABNORMAL HIGH (ref 11–307)

## 2019-07-10 LAB — SARS CORONAVIRUS 2 BY RT PCR (HOSPITAL ORDER, PERFORMED IN ~~LOC~~ HOSPITAL LAB): SARS Coronavirus 2: NEGATIVE

## 2019-07-10 LAB — LACTIC ACID, PLASMA
Lactic Acid, Venous: 1 mmol/L (ref 0.5–1.9)
Lactic Acid, Venous: 0.9 mmol/L (ref 0.5–1.9)

## 2019-07-10 LAB — APTT: aPTT: 51 seconds — ABNORMAL HIGH (ref 24–36)

## 2019-07-10 LAB — CBG MONITORING, ED: Glucose-Capillary: 204 mg/dL — ABNORMAL HIGH (ref 70–99)

## 2019-07-10 MED ORDER — METOCLOPRAMIDE HCL 5 MG/ML IJ SOLN
5.0000 mg | Freq: Four times a day (QID) | INTRAMUSCULAR | Status: DC
  Administered 2019-07-10 – 2019-07-11 (×3): 5 mg via INTRAVENOUS
  Filled 2019-07-10 (×3): qty 2

## 2019-07-10 MED ORDER — METOPROLOL TARTRATE 25 MG PO TABS
25.0000 mg | ORAL_TABLET | Freq: Once | ORAL | Status: AC
  Administered 2019-07-10: 25 mg via ORAL
  Filled 2019-07-10: qty 1

## 2019-07-10 MED ORDER — SODIUM CHLORIDE 0.9% FLUSH
3.0000 mL | Freq: Two times a day (BID) | INTRAVENOUS | Status: DC
  Administered 2019-07-10 – 2019-07-12 (×4): 3 mL via INTRAVENOUS

## 2019-07-10 MED ORDER — ASPIRIN 81 MG PO CHEW: 81.0000 mg | CHEWABLE_TABLET | Freq: Every day | ORAL | Status: DC

## 2019-07-10 MED ORDER — ALBUTEROL SULFATE (2.5 MG/3ML) 0.083% IN NEBU: 3.0000 mL | INHALATION_SOLUTION | RESPIRATORY_TRACT | Status: DC | PRN

## 2019-07-10 MED ORDER — ACETAMINOPHEN 325 MG PO TABS
650.0000 mg | ORAL_TABLET | ORAL | Status: DC | PRN
  Administered 2019-07-10 – 2019-07-11 (×2): 650 mg via ORAL
  Filled 2019-07-10 (×2): qty 2

## 2019-07-10 MED ORDER — RIVAROXABAN 20 MG PO TABS
20.0000 mg | ORAL_TABLET | Freq: Every day | ORAL | Status: DC
  Administered 2019-07-10: 20 mg via ORAL
  Filled 2019-07-10: qty 1

## 2019-07-10 MED ORDER — INSULIN ASPART 100 UNIT/ML ~~LOC~~ SOLN
0.0000 [IU] | SUBCUTANEOUS | Status: DC
  Administered 2019-07-10: 1 [IU] via SUBCUTANEOUS
  Administered 2019-07-10: 3 [IU] via SUBCUTANEOUS
  Administered 2019-07-10: 2 [IU] via SUBCUTANEOUS
  Administered 2019-07-10 – 2019-07-11 (×2): 5 [IU] via SUBCUTANEOUS
  Administered 2019-07-11: 2 [IU] via SUBCUTANEOUS

## 2019-07-10 MED ORDER — BACLOFEN 5 MG HALF TABLET
10.0000 mg | ORAL_TABLET | Freq: Four times a day (QID) | ORAL | Status: DC
  Administered 2019-07-10 – 2019-07-11 (×5): 10 mg via ORAL
  Filled 2019-07-10 (×5): qty 2

## 2019-07-10 MED ORDER — ONDANSETRON HCL 4 MG/2ML IJ SOLN
4.0000 mg | Freq: Four times a day (QID) | INTRAMUSCULAR | Status: DC | PRN
  Administered 2019-07-10: 4 mg via INTRAVENOUS
  Filled 2019-07-10: qty 2

## 2019-07-10 MED ORDER — INSULIN GLARGINE 100 UNIT/ML ~~LOC~~ SOLN
34.0000 [IU] | Freq: Every day | SUBCUTANEOUS | Status: DC
  Filled 2019-07-10 (×2): qty 0.34

## 2019-07-10 MED ORDER — MORPHINE SULFATE (PF) 2 MG/ML IV SOLN
2.0000 mg | INTRAVENOUS | Status: DC | PRN
  Administered 2019-07-10 (×2): 4 mg via INTRAVENOUS
  Administered 2019-07-12 (×3): 2 mg via INTRAVENOUS
  Filled 2019-07-10 (×2): qty 2
  Filled 2019-07-10 (×3): qty 1

## 2019-07-10 MED ORDER — ASPIRIN EC 81 MG PO TBEC
81.0000 mg | DELAYED_RELEASE_TABLET | Freq: Every day | ORAL | Status: DC
  Administered 2019-07-10 – 2019-07-12 (×3): 81 mg via ORAL
  Filled 2019-07-10 (×3): qty 1

## 2019-07-10 MED ORDER — POTASSIUM CHLORIDE CRYS ER 10 MEQ PO TBCR
10.0000 meq | EXTENDED_RELEASE_TABLET | Freq: Every day | ORAL | Status: DC
  Administered 2019-07-10 – 2019-07-12 (×3): 10 meq via ORAL
  Filled 2019-07-10 (×3): qty 1

## 2019-07-10 MED ORDER — FUROSEMIDE 10 MG/ML IJ SOLN
20.0000 mg | Freq: Once | INTRAMUSCULAR | Status: AC
  Administered 2019-07-10: 20 mg via INTRAVENOUS
  Filled 2019-07-10: qty 2

## 2019-07-10 MED ORDER — NITROGLYCERIN 0.4 MG SL SUBL: 0.4000 mg | SUBLINGUAL_TABLET | SUBLINGUAL | Status: DC | PRN

## 2019-07-10 MED ORDER — ISOSORBIDE MONONITRATE ER 30 MG PO TB24
90.0000 mg | ORAL_TABLET | Freq: Every day | ORAL | Status: DC
  Administered 2019-07-10 – 2019-07-12 (×3): 90 mg via ORAL
  Filled 2019-07-10 (×3): qty 3

## 2019-07-10 MED ORDER — GABAPENTIN 600 MG PO TABS: 300.0000 mg | ORAL_TABLET | Freq: Three times a day (TID) | ORAL | Status: DC

## 2019-07-10 MED ORDER — SODIUM CHLORIDE 0.9% FLUSH: 3.0000 mL | INTRAVENOUS | Status: DC | PRN

## 2019-07-10 MED ORDER — ROSUVASTATIN CALCIUM 20 MG PO TABS
40.0000 mg | ORAL_TABLET | Freq: Every day | ORAL | Status: DC
  Administered 2019-07-11: 40 mg via ORAL
  Filled 2019-07-10 (×2): qty 2

## 2019-07-10 MED ORDER — GABAPENTIN 300 MG PO CAPS
300.0000 mg | ORAL_CAPSULE | Freq: Three times a day (TID) | ORAL | Status: DC
  Administered 2019-07-10 – 2019-07-12 (×5): 300 mg via ORAL
  Filled 2019-07-10 (×6): qty 1

## 2019-07-10 MED ORDER — ONDANSETRON HCL 4 MG/2ML IJ SOLN
4.0000 mg | Freq: Once | INTRAMUSCULAR | Status: AC
  Administered 2019-07-10: 4 mg via INTRAVENOUS
  Filled 2019-07-10: qty 2

## 2019-07-10 MED ORDER — AMLODIPINE BESYLATE 10 MG PO TABS
10.0000 mg | ORAL_TABLET | Freq: Every day | ORAL | Status: DC
  Administered 2019-07-10 – 2019-07-11 (×2): 10 mg via ORAL
  Filled 2019-07-10 (×2): qty 1

## 2019-07-10 MED ORDER — SODIUM CHLORIDE 0.9 % IV SOLN: 250.0000 mL | INTRAVENOUS | Status: DC | PRN

## 2019-07-10 MED ORDER — FUROSEMIDE 20 MG PO TABS
20.0000 mg | ORAL_TABLET | Freq: Every day | ORAL | Status: DC
  Administered 2019-07-10: 20 mg via ORAL
  Filled 2019-07-10: qty 1

## 2019-07-10 MED ORDER — LIDOCAINE 5 % EX PTCH
1.0000 | MEDICATED_PATCH | CUTANEOUS | Status: DC
  Administered 2019-07-10 – 2019-07-12 (×3): 1 via TRANSDERMAL
  Filled 2019-07-10 (×3): qty 1

## 2019-07-10 MED ORDER — PANTOPRAZOLE SODIUM 40 MG PO TBEC
40.0000 mg | DELAYED_RELEASE_TABLET | Freq: Two times a day (BID) | ORAL | Status: DC
  Administered 2019-07-10 – 2019-07-12 (×4): 40 mg via ORAL
  Filled 2019-07-10 (×4): qty 1

## 2019-07-10 MED ORDER — CLONIDINE HCL 0.1 MG PO TABS
0.1000 mg | ORAL_TABLET | Freq: Two times a day (BID) | ORAL | Status: DC
  Administered 2019-07-10 – 2019-07-12 (×5): 0.1 mg via ORAL
  Filled 2019-07-10 (×5): qty 1

## 2019-07-10 MED ORDER — FENTANYL CITRATE (PF) 100 MCG/2ML IJ SOLN
50.0000 ug | Freq: Once | INTRAMUSCULAR | Status: AC
  Administered 2019-07-10: 50 ug via INTRAVENOUS
  Filled 2019-07-10: qty 2

## 2019-07-10 NOTE — Progress Notes (Signed)
Patient arrived to unit, ambulated to bed. Patient has cane with her. Complaining of 8/10 back pain, but just received morphine IV. Patient alert and oriented, cardiac monitor initiated and verified. Patient updated on plan of care.

## 2019-07-10 NOTE — Discharge Instructions (Signed)
Information on my medicine - XARELTO (rivaroxaban)  WHY WAS XARELTO PRESCRIBED FOR YOU? Xarelto was prescribed to treat blood clots that may have been found in the veins of your legs (deep vein thrombosis) or in your lungs (pulmonary embolism) and to reduce the risk of them occurring again.  What do you need to know about Xarelto? The dose is one 20 mg tablet taken ONCE A DAY with your evening meal.  DO NOT stop taking Xarelto without talking to the health care provider who prescribed the medication.  Refill your prescription for 20 mg tablets before you run out.  After discharge, you should have regular check-up appointments with your healthcare provider that is prescribing your Xarelto.  In the future your dose may need to be changed if your kidney function changes by a significant amount.  What do you do if you miss a dose? If you are taking Xarelto TWICE DAILY and you miss a dose, take it as soon as you remember. You may take two 15 mg tablets (total 30 mg) at the same time then resume your regularly scheduled 15 mg twice daily the next day.  If you are taking Xarelto ONCE DAILY and you miss a dose, take it as soon as you remember on the same day then continue your regularly scheduled once daily regimen the next day. Do not take two doses of Xarelto at the same time.   Important Safety Information Xarelto is a blood thinner medicine that can cause bleeding. You should call your healthcare provider right away if you experience any of the following: Bleeding from an injury or your nose that does not stop. Unusual colored urine (red or dark brown) or unusual colored stools (red or black). Unusual bruising for unknown reasons. A serious fall or if you hit your head (even if there is no bleeding).  Some medicines may interact with Xarelto and might increase your risk of bleeding while on Xarelto. To help avoid this, consult your healthcare provider or pharmacist prior to using any  new prescription or non-prescription medications, including herbals, vitamins, non-steroidal anti-inflammatory drugs (NSAIDs) and supplements.  This website has more information on Xarelto: www.xarelto.com.  

## 2019-07-10 NOTE — Plan of Care (Signed)
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
## 2019-07-10 NOTE — ED Triage Notes (Signed)
Pt bib [facility] from home d/t CP.  Pt took 324 asa and nitro x 3 prior to EMS arrival.  Pt reports pain started yesterday and was intermittent until 2130.  +shob, nausea, dizziness.

## 2019-07-10 NOTE — Progress Notes (Addendum)
   Follow Up Note  HPI: 63 year old female with past medical history of CAD, DVT on Xarelto, insulin-dependent diabetes mellitus, stage II chronic kidney disease and recent admission for left upper arm abscess with concern for myositis/osteo-status post I&D presented to the emergency room on the early morning of 8/16 with complaints of chest pain as it was generalized aches and pains plus nausea and vomiting.  Patient states symptoms started 2 days ago.  She describes aches including across her chest, anterior aspect of both thighs, posterior calves, and posterior trapezius bilaterally.  Patient had a recent nuclear medicine stress test in June which noted no significant ischemia and probable normal perfusion with soft tissue attenuation.  Her EKG on admission noted some T wave abnormalities more prominent than prior, but otherwise unremarkable.  Troponins were on the high end of normal.  Pt admitted earlier this morning.  Seen after arrived to floor.  Her aches and pains are unchanged.  Follow-up EKG noted less prominent T waves and cardiac markers remain on high end of normal.  She continues to complain of abdominal discomfort and nausea/vomiting  Exam: CV: Regular rate and rhythm, S1-S2 Lungs: Clear to auscultation bilaterally Abd: Soft, distended, nontender, few bowel sounds Ext: No clubbing or cyanosis or edema  Principal Problem:   Chest pain/complaints of total body pain: Have a low suspicion that this is indeed cardiac.  Would recheck EKG in the morning along with final troponin.  Have a suspicion given complaints that this may be more emotional based pain such as fibromyalgia given trigger points that are in non-joint areas.  Ferritin minimally elevated, CRP normal so I do not think that this is rheumatologic.  If symptoms are persisting by tomorrow, would recommend starting Elavil and discharged home.  Have started lidocaine patch for lower back which is her biggest complaint Active  Problems:   Iron deficiency anemia: Stable, MCV at 71, hemoglobin at 10.7    Hyponatremia: Stable    Essential hypertension: Initially elevated with extra dose of Lasix has since improved    PAD (peripheral artery disease) (HCC)    Chronic diastolic CHF (congestive heart failure) ([place]): We will check a BNP, give extra dose of Lasix    History of DVT (deep vein thrombosis): Continue Xarelto    CKD (chronic kidney disease), stage III ([place]): Stable at baseline  Uncontrolled insulin dependent diabetes mellitus ([place]): A1c last month at 13.1.    Nausea/vomiting: Given abdominal exam, I suspect that she likely may have some signs of gastroparesis.  Have started some Reglan and slowly advancing diet, currently on clear liquids, will try full liquids for dinner tonight    Troponin level elevated   COPD, mild (HCC)    Disposition: Anticipate discharge home tomorrow

## 2019-07-10 NOTE — ED Notes (Signed)
Pt ambulated to bathroom 
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
## 2019-07-10 NOTE — ED Provider Notes (Signed)
Patient seen/examined in the Emergency Department in conjunction with Advanced Practice Provider  Patient reports multiple complaints - CP/abd pain, back pain, sob Exam : awake/alert, no distress, no murmurs, lung sounds clear Plan: workup pending at this time, likely admit       Ripley Fraise, MD 07/10/19 [phone]

## 2019-07-11 ENCOUNTER — Observation Stay (HOSPITAL_COMMUNITY): Payer: Medicare Other

## 2019-07-11 ENCOUNTER — Inpatient Hospital Stay (HOSPITAL_COMMUNITY): Payer: Medicare Other

## 2019-07-11 DIAGNOSIS — Z20828 Contact with and (suspected) exposure to other viral communicable diseases: Secondary | ICD-10-CM | POA: Diagnosis present

## 2019-07-11 DIAGNOSIS — N179 Acute kidney failure, unspecified: Secondary | ICD-10-CM | POA: Diagnosis present

## 2019-07-11 DIAGNOSIS — E871 Hypo-osmolality and hyponatremia: Secondary | ICD-10-CM | POA: Diagnosis present

## 2019-07-11 DIAGNOSIS — R251 Tremor, unspecified: Secondary | ICD-10-CM | POA: Diagnosis present

## 2019-07-11 DIAGNOSIS — R52 Pain, unspecified: Secondary | ICD-10-CM

## 2019-07-11 DIAGNOSIS — D509 Iron deficiency anemia, unspecified: Secondary | ICD-10-CM | POA: Diagnosis present

## 2019-07-11 DIAGNOSIS — I25110 Atherosclerotic heart disease of native coronary artery with unstable angina pectoris: Secondary | ICD-10-CM | POA: Diagnosis present

## 2019-07-11 DIAGNOSIS — E1122 Type 2 diabetes mellitus with diabetic chronic kidney disease: Secondary | ICD-10-CM | POA: Diagnosis present

## 2019-07-11 DIAGNOSIS — I739 Peripheral vascular disease, unspecified: Secondary | ICD-10-CM

## 2019-07-11 DIAGNOSIS — M19072 Primary osteoarthritis, left ankle and foot: Secondary | ICD-10-CM | POA: Diagnosis present

## 2019-07-11 DIAGNOSIS — I493 Ventricular premature depolarization: Secondary | ICD-10-CM | POA: Diagnosis present

## 2019-07-11 DIAGNOSIS — N183 Chronic kidney disease, stage 3 (moderate): Secondary | ICD-10-CM | POA: Diagnosis present

## 2019-07-11 DIAGNOSIS — E1165 Type 2 diabetes mellitus with hyperglycemia: Secondary | ICD-10-CM | POA: Diagnosis present

## 2019-07-11 DIAGNOSIS — E1151 Type 2 diabetes mellitus with diabetic peripheral angiopathy without gangrene: Secondary | ICD-10-CM | POA: Diagnosis present

## 2019-07-11 DIAGNOSIS — I5032 Chronic diastolic (congestive) heart failure: Secondary | ICD-10-CM | POA: Diagnosis present

## 2019-07-11 DIAGNOSIS — J449 Chronic obstructive pulmonary disease, unspecified: Secondary | ICD-10-CM | POA: Diagnosis present

## 2019-07-11 DIAGNOSIS — I130 Hypertensive heart and chronic kidney disease with heart failure and stage 1 through stage 4 chronic kidney disease, or unspecified chronic kidney disease: Secondary | ICD-10-CM | POA: Diagnosis present

## 2019-07-11 DIAGNOSIS — M19071 Primary osteoarthritis, right ankle and foot: Secondary | ICD-10-CM | POA: Diagnosis present

## 2019-07-11 DIAGNOSIS — E1142 Type 2 diabetes mellitus with diabetic polyneuropathy: Secondary | ICD-10-CM | POA: Diagnosis present

## 2019-07-11 DIAGNOSIS — E785 Hyperlipidemia, unspecified: Secondary | ICD-10-CM | POA: Diagnosis present

## 2019-07-11 DIAGNOSIS — I160 Hypertensive urgency: Secondary | ICD-10-CM | POA: Diagnosis present

## 2019-07-11 DIAGNOSIS — E11649 Type 2 diabetes mellitus with hypoglycemia without coma: Secondary | ICD-10-CM | POA: Diagnosis not present

## 2019-07-11 DIAGNOSIS — M1711 Unilateral primary osteoarthritis, right knee: Secondary | ICD-10-CM | POA: Diagnosis present

## 2019-07-11 DIAGNOSIS — E860 Dehydration: Secondary | ICD-10-CM | POA: Diagnosis present

## 2019-07-11 DIAGNOSIS — M479 Spondylosis, unspecified: Secondary | ICD-10-CM | POA: Diagnosis present

## 2019-07-11 DIAGNOSIS — R079 Chest pain, unspecified: Secondary | ICD-10-CM | POA: Diagnosis present

## 2019-07-11 DIAGNOSIS — M545 Low back pain: Secondary | ICD-10-CM | POA: Diagnosis present

## 2019-07-11 DIAGNOSIS — R7989 Other specified abnormal findings of blood chemistry: Secondary | ICD-10-CM

## 2019-07-11 LAB — BLOOD GAS, ARTERIAL
Acid-base deficit: 2.9 mmol/L — ABNORMAL HIGH (ref 0.0–2.0)
Bicarbonate: 21.4 mmol/L (ref 20.0–28.0)
Drawn by: 511331
FIO2: 21
O2 Saturation: 91.6 %
Patient temperature: 98.6
pCO2 arterial: 37.4 mmHg (ref 32.0–48.0)
pH, Arterial: 7.377 (ref 7.350–7.450)
pO2, Arterial: 65.8 mmHg — ABNORMAL LOW (ref 83.0–108.0)

## 2019-07-11 LAB — CBC
HCT: 34.1 % — ABNORMAL LOW (ref 36.0–46.0)
Hemoglobin: 10.4 g/dL — ABNORMAL LOW (ref 12.0–15.0)
MCH: 21.8 pg — ABNORMAL LOW (ref 26.0–34.0)
MCHC: 30.5 g/dL (ref 30.0–36.0)
MCV: 71.5 fL — ABNORMAL LOW (ref 80.0–100.0)
Platelets: 279 10*3/uL (ref 150–400)
RBC: 4.77 MIL/uL (ref 3.87–5.11)
RDW: 17.2 % — ABNORMAL HIGH (ref 11.5–15.5)
WBC: 10 10*3/uL (ref 4.0–10.5)
nRBC: 0.2 % (ref 0.0–0.2)

## 2019-07-11 LAB — GLUCOSE, CAPILLARY
Glucose-Capillary: 162 mg/dL — ABNORMAL HIGH (ref 70–99)
Glucose-Capillary: 183 mg/dL — ABNORMAL HIGH (ref 70–99)
Glucose-Capillary: 225 mg/dL — ABNORMAL HIGH (ref 70–99)
Glucose-Capillary: 235 mg/dL — ABNORMAL HIGH (ref 70–99)
Glucose-Capillary: 274 mg/dL — ABNORMAL HIGH (ref 70–99)
Glucose-Capillary: 337 mg/dL — ABNORMAL HIGH (ref 70–99)
Glucose-Capillary: 346 mg/dL — ABNORMAL HIGH (ref 70–99)
Glucose-Capillary: 396 mg/dL — ABNORMAL HIGH (ref 70–99)
Glucose-Capillary: 415 mg/dL — ABNORMAL HIGH (ref 70–99)
Glucose-Capillary: 419 mg/dL — ABNORMAL HIGH (ref 70–99)
Glucose-Capillary: 45 mg/dL — ABNORMAL LOW (ref 70–99)

## 2019-07-11 LAB — BASIC METABOLIC PANEL
Anion gap: 13 (ref 5–15)
BUN: 22 mg/dL (ref 8–23)
CO2: 19 mmol/L — ABNORMAL LOW (ref 22–32)
Calcium: 9 mg/dL (ref 8.9–10.3)
Chloride: 100 mmol/L (ref 98–111)
Creatinine, Ser: 2.64 mg/dL — ABNORMAL HIGH (ref 0.44–1.00)
GFR calc Af Amer: 22 mL/min — ABNORMAL LOW (ref >60)
GFR calc non Af Amer: 19 mL/min — ABNORMAL LOW (ref >60)
Glucose, Bld: 279 mg/dL — ABNORMAL HIGH (ref 70–99)
Potassium: 4.2 mmol/L (ref 3.5–5.1)
Sodium: 132 mmol/L — ABNORMAL LOW (ref 135–145)

## 2019-07-11 MED ORDER — INSULIN ASPART 100 UNIT/ML ~~LOC~~ SOLN
0.0000 [IU] | Freq: Three times a day (TID) | SUBCUTANEOUS | Status: DC
  Administered 2019-07-11: 2 [IU] via SUBCUTANEOUS
  Administered 2019-07-12 (×2): 5 [IU] via SUBCUTANEOUS

## 2019-07-11 MED ORDER — RIVAROXABAN 20 MG PO TABS: 20.0000 mg | ORAL_TABLET | Freq: Every day | ORAL | Status: DC

## 2019-07-11 MED ORDER — DEXTROSE 50 % IV SOLN: 25.0000 g | INTRAVENOUS | Status: DC

## 2019-07-11 MED ORDER — INSULIN ASPART 100 UNIT/ML ~~LOC~~ SOLN
0.0000 [IU] | Freq: Every day | SUBCUTANEOUS | Status: DC
  Administered 2019-07-11: 2 [IU] via SUBCUTANEOUS

## 2019-07-11 MED ORDER — SODIUM CHLORIDE 0.9 % IV BOLUS
500.0000 mL | Freq: Once | INTRAVENOUS | Status: AC
  Administered 2019-07-11: 500 mL via INTRAVENOUS

## 2019-07-11 MED ORDER — DEXTROSE 50 % IV SOLN
INTRAVENOUS | Status: AC
  Administered 2019-07-11: 50 mL
  Filled 2019-07-11: qty 50

## 2019-07-11 MED ORDER — INSULIN GLARGINE 100 UNIT/ML ~~LOC~~ SOLN
20.0000 [IU] | Freq: Every day | SUBCUTANEOUS | Status: DC
  Filled 2019-07-11: qty 0.2

## 2019-07-11 MED ORDER — LORAZEPAM 2 MG/ML IJ SOLN: 1.0000 mg | INTRAMUSCULAR | Status: AC

## 2019-07-11 NOTE — Evaluation (Signed)
Physical Therapy Evaluation Patient Details Name: Alejandra Hernandez MRN: [id number] DOB: 08/12/1956 Today's Date: 07/11/2019   History of Present Illness  Alejandra Hernandez is a 63 y.o. female with medical history significant for coronary artery disease, history of DVT on Xarelto, insulin-dependent diabetes mellitus, COPD, chronic kidney disease stage II, iron deficiency anemia, and recent admission with left upper arm abscess with concern for myositis/osteomyelitis status post I&D and completion of antibiotics, now presenting to the emergency department for evaluation of chest pain, generalized aches and pains, nausea, and nonbloody vomiting.   Clinical Impression  Pt admitted with above diagnosis. Pt lethargic on PT arrival but arousable with movement of limbs. Pt confused about where she is, why she is here, and what has happened. Poor historian though she does report falling several times in past few months. Pt needed mod A to pivot to [facility]. Once she was up she began having increased L frontal head and L shoulder pain and could not attempt ambulation. Pt demonstrated cognitive deficits throughout including trying to wipe her mouth with the same paper she was using to wipe her perineal area (she was prevented from doing this). Unsafe to be home alone for any amount of time at this point.  Pt currently with functional limitations due to the deficits listed below (see PT Problem List). Pt will benefit from skilled PT to increase their independence and safety with mobility to allow discharge to the venue listed below.       Follow Up Recommendations SNF;Supervision/Assistance - 24 hour    Equipment Recommendations  None recommended by PT    Recommendations for Other Services OT consult;Speech consult     Precautions / Restrictions Precautions Precautions: Fall Precaution Comments: pt reports she has fallen at least 2x in past 3 mos Restrictions Weight Bearing Restrictions: No      Mobility  Bed Mobility Overal bed mobility: Needs Assistance Bed Mobility: Supine to Sit;Sit to Supine     Supine to sit: Min guard Sit to supine: Min assist   General bed mobility comments: able to get LE's off bed and come to sitting with min-guard A and use of rail. Needed min A to reposition back in bed after returning to supine  Transfers Overall transfer level: Needs assistance Equipment used: None Transfers: Sit to/from [facility] Sit to Stand: Mod assist Stand pivot transfers: Min assist       General transfer comment: pt attempted to stand on her own and was unable, min A given for support and balance. Min A to pivot to [facility] and back. Pt began having increased head and shoulder pain when transferring  Ambulation/Gait             General Gait Details: encouraged pt to attempt ambulation but she was in too much head and shoulder pain and was making even less sense in her conversation as her pain increased so ambulation deferred  Stairs            Wheelchair Mobility    Modified Rankin (Stroke Patients Only) Modified Rankin (Stroke Patients Only) Pre-Morbid Rankin Score: Moderately severe disability Modified Rankin: Severe disability     Balance Overall balance assessment: Needs assistance;History of Falls Sitting-balance support: Single extremity supported Sitting balance-Leahy Scale: Poor Sitting balance - Comments: unsteady EOB, close guarding given, R lean Postural control: Right lateral lean Standing balance support: Single extremity supported Standing balance-Leahy Scale: Poor Standing balance comment: unable to safely stand without support  Pertinent Vitals/Pain Pain Assessment: Faces Faces Pain Scale: Hurts whole lot Pain Location: L forehead and L shoulder with mobility Pain Descriptors / Indicators: Aching;Sore Pain Intervention(s): Limited activity within patient's tolerance;Monitored during  session;Patient requesting pain meds-RN notified    Home Living Family/patient expects to be discharged to:: Private residence Living Arrangements: Children Available Help at Discharge: Family;Friend(s);Available PRN/intermittently Type of Home: House Home Access: Stairs to enter Entrance Stairs-Rails: Right Entrance Stairs-Number of Steps: 2 Home Layout: One level Home Equipment: Cane - single point;Shower seat;Wheelchair - [employer] - 2 wheels Additional Comments: Typically uses cane for gait, sits for bathing    Prior Function Level of Independence: Needs assistance   Gait / Transfers Assistance Needed: use of SPC for mobility  ADL's / Homemaking Assistance Needed: states family was assisting some with ADLs  Comments: does not drive, some info obtained from chart as pt not a reliable historian this admission     Hand Dominance   Dominant Hand: Right    Extremity/Trunk Assessment   Upper Extremity Assessment Upper Extremity Assessment: Generalized weakness    Lower Extremity Assessment Lower Extremity Assessment: Generalized weakness    Cervical / Trunk Assessment Cervical / Trunk Assessment: Kyphotic  Communication   Communication: No difficulties  Cognition Arousal/Alertness: Lethargic Behavior During Therapy: Flat affect Overall Cognitive Status: Impaired/Different from baseline Area of Impairment: Orientation;Memory;Safety/judgement;Awareness;Problem solving                 Orientation Level: Disoriented to;Situation;Time   Memory: Decreased short-term memory   Safety/Judgement: Decreased awareness of deficits Awareness: Intellectual Problem Solving: Slow processing;Difficulty sequencing;Requires verbal cues;Requires tactile cues General Comments: pt demonstrates agnosia when moving in her bed, mixed up different items as she held them. Also, wiped her perineal area after trying to urinate and then wa about to use the same paper to wipe her mouth.  Kept asking what jad happened to her and thinks she has fallen? Refers to her L shoulder surgery where scar is evident but cannot tell me when she had it      General Comments General comments (skin integrity, edema, etc.): SPO2 97% on RA    Exercises     Assessment/Plan    PT Assessment Patient needs continued PT services  PT Problem List Decreased strength;Decreased activity tolerance;Decreased balance;Decreased mobility;Decreased coordination;Decreased cognition;Decreased knowledge of use of DME;Decreased knowledge of precautions;Pain;Decreased safety awareness       PT Treatment Interventions DME instruction;Gait training;Functional mobility training;Therapeutic activities;Therapeutic exercise;Balance training;Neuromuscular re-education;Cognitive remediation;Patient/family education    PT Goals (Current goals can be found in the Care Plan section)  Acute Rehab PT Goals Patient Stated Goal: decreased pain PT Goal Formulation: Patient unable to participate in goal setting Time For Goal Achievement: 07/25/19 Potential to Achieve Goals: Fair    Frequency Min 2X/week   Barriers to discharge Decreased caregiver support alone much of the day    Co-evaluation               AM-PAC PT "6 Clicks" Mobility  Outcome Measure Help needed turning from your back to your side while in a flat bed without using bedrails?: A Little Help needed moving from lying on your back to sitting on the side of a flat bed without using bedrails?: A Little Help needed moving to and from a bed to a chair (including a wheelchair)?: A Lot Help needed standing up from a chair using your arms (e.g., wheelchair or bedside chair)?: A Lot Help needed to walk in hospital room?: Total Help needed  climbing 3-5 steps with a railing? : Total 6 Click Score: 12    End of Session   Activity Tolerance: Patient limited by pain Patient left: in bed;with call bell/phone within reach;with bed alarm set Nurse  Communication: Mobility status PT Visit Diagnosis: Unsteadiness on feet (R26.81);Repeated falls (R29.6);Muscle weakness (generalized) (M62.81);Pain;Difficulty in walking, not elsewhere classified (R26.2) Pain - Right/Left: Left Pain - part of body: Shoulder    Time: 1834-3735 PT Time Calculation (min) (ACUTE ONLY): 22 min   Charges:   PT Evaluation $PT Eval Moderate Complexity: [place]  Pager [phone] Office [place] 07/11/2019, 3:35 PM

## 2019-07-11 NOTE — Progress Notes (Signed)
Unable to wt pt on standing scale , pt has hypotension and lethalgic. Wt pt on bed this am

## 2019-07-11 NOTE — Progress Notes (Signed)
Notified provider pt has  Episode  CBG low 45, drowsi, stat hypoglycemia order, BG 346 at 0053, still lethagic, received order to stat ABG, notified results to provider , statrt 2 L, n/c,  Pt rest in bed,Will continue monitor

## 2019-07-11 NOTE — Progress Notes (Signed)
Patient no urine output, bladder scan done obtained 324ml. I and O cath done  Drained 585ml. Urine. Clear yellow with sediments.

## 2019-07-11 NOTE — Progress Notes (Addendum)
Not a rapid response event  I was notified of pts hypoglycemic event and lethargy. I was on another call with a patient and came to see Alejandra Hernandez as soon as I could. Upon arrival, she was not arousable to voice or even slight touch. When I stimulated her with a trapezius squeeze, she became fully awake, sitting up in bed, and answering questions appropriate for the situation.  MAEx4.  Her LLE she said is a little weaker but has been that way. I instructed the RN to inform the primary svc.

## 2019-07-11 NOTE — Plan of Care (Signed)

## 2019-07-11 NOTE — Progress Notes (Addendum)
Patient awake, alert and oriented, does not remember anything  what happened previous shift.  Verbalized weakness  On both hands " pt. Stated something is wrong  But nobody believes her. Pt., spilled coffee on the bed,noted shaking on  both hands.Keeps on repeating what she says.  Will monitor.

## 2019-07-11 NOTE — Progress Notes (Signed)
Pt  Refused to void,no document  Pt  Void from 07pm till now, bladder scan 400 cc, notified provider

## 2019-07-11 NOTE — Progress Notes (Signed)
Inpatient Diabetes Program Recommendations  AACE/ADA: New Consensus Statement on Inpatient Glycemic Control (2015)  Target Ranges:  Prepandial:   less than 140 mg/dL      Peak postprandial:   less than 180 mg/dL (1-2 hours)      Critically ill patients:  140 - 180 mg/dL   Lab Results  Component Value Date   GLUCAP 274 (H) 07/11/2019   HGBA1C 13.1 (H) 06/06/2019    Review of Glycemic Control  Diabetes history: DM 2 Outpatient Diabetes medications: Tresiba 68 units, Humalog 1-10 units tid Current orders for Inpatient glycemic control:  Lantus 20 units Novolog 0-9 units Q4 hours  A1c 13.1% on 7/13. DM Coordinator spoke with patient on 7/13 regarding A1c. Patient followed by [facility]  Inpatient Diabetes Program Recommendations:    Renal function increasing. Patient has not received Lantus this admission. Called [place]. [place] for update in plan of care with patient. Patient potentially held onto Novolog insulin to cause hypoglycemia. Dr. [place] to review.  Recommend Novolog 0-9 tid + adding Novolog 0-5 units qhs for bedtime coverage starting at 200 mg/dl.  Thanks,  Tama Headings RN, MSN, BC-ADM Inpatient Diabetes Coordinator Team Pager [phone] (8a-5p)
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
## 2019-07-12 LAB — GLUCOSE, CAPILLARY
Glucose-Capillary: 185 mg/dL — ABNORMAL HIGH (ref 70–99)
Glucose-Capillary: 201 mg/dL — ABNORMAL HIGH (ref 70–99)
Glucose-Capillary: 248 mg/dL — ABNORMAL HIGH (ref 70–99)
Glucose-Capillary: 261 mg/dL — ABNORMAL HIGH (ref 70–99)
Glucose-Capillary: 281 mg/dL — ABNORMAL HIGH (ref 70–99)

## 2019-07-12 LAB — BASIC METABOLIC PANEL
Anion gap: 14 (ref 5–15)
BUN: 21 mg/dL (ref 8–23)
CO2: 17 mmol/L — ABNORMAL LOW (ref 22–32)
Calcium: 9 mg/dL (ref 8.9–10.3)
Chloride: 102 mmol/L (ref 98–111)
Creatinine, Ser: 1.44 mg/dL — ABNORMAL HIGH (ref 0.44–1.00)
GFR calc Af Amer: 45 mL/min — ABNORMAL LOW (ref >60)
GFR calc non Af Amer: 39 mL/min — ABNORMAL LOW (ref >60)
Glucose, Bld: 261 mg/dL — ABNORMAL HIGH (ref 70–99)
Potassium: 4.8 mmol/L (ref 3.5–5.1)
Sodium: 133 mmol/L — ABNORMAL LOW (ref 135–145)

## 2019-07-12 LAB — MAGNESIUM: Magnesium: 1.4 mg/dL — ABNORMAL LOW (ref 1.7–2.4)

## 2019-07-12 MED ORDER — ROSUVASTATIN CALCIUM 40 MG PO TABS: 40.0000 mg | ORAL_TABLET | Freq: Every day | ORAL | 0 refills | Status: DC

## 2019-07-12 MED ORDER — MAGNESIUM SULFATE 2 GM/50ML IV SOLN
2.0000 g | INTRAVENOUS | Status: AC
  Administered 2019-07-12 (×2): 2 g via INTRAVENOUS
  Filled 2019-07-12 (×2): qty 50

## 2019-07-12 MED ORDER — INSULIN GLARGINE 100 UNIT/ML ~~LOC~~ SOLN
10.0000 [IU] | Freq: Every day | SUBCUTANEOUS | Status: DC
  Filled 2019-07-12: qty 0.1

## 2019-07-12 NOTE — Evaluation (Signed)
Occupational Therapy Evaluation Patient Details Name: Alejandra Hernandez MRN: [id number] DOB: 15-Feb-1956 Today's Date: 07/12/2019    History of Present Illness Alejandra Hernandez is a 63 y.o. female with medical history significant for coronary artery disease, history of DVT on Xarelto, insulin-dependent diabetes mellitus, COPD, chronic kidney disease stage II, iron deficiency anemia, and recent admission with left upper arm abscess with concern for myositis/osteomyelitis status post I&D and completion of antibiotics, now presenting to the emergency department for evaluation of chest pain, generalized aches and pains, nausea, and nonbloody vomiting.    Clinical Impression   Pt is functioning at a supervision level in OOB mobility and ADL despite no functional use of L shoulder due to pain. Pt is scheduled to begin outpatient therapy for her shoulder tomorrow. No further acute OT needs.    Follow Up Recommendations  Follow surgeon's recommendation for DC plan and follow-up therapies;Supervision - Intermittent    Equipment Recommendations  None recommended by OT    Recommendations for Other Services       Precautions / Restrictions Precautions Precautions: Fall Restrictions Weight Bearing Restrictions: No      Mobility Bed Mobility Overal bed mobility: Modified Independent                Transfers Overall transfer level: Needs assistance Equipment used: None Transfers: Sit to/from Stand;Stand Pivot Transfers Sit to Stand: Supervision Stand pivot transfers: Supervision       General transfer comment: for safety and lines    Balance Overall balance assessment: Needs assistance;History of Falls Sitting-balance support: No upper extremity supported Sitting balance-Leahy Scale: Normal       Standing balance-Leahy Scale: Good                             ADL either performed or assessed with clinical judgement   ADL                                          General ADL Comments: functioning at a supervision level     Vision Baseline Vision/History: No visual deficits Patient Visual Report: No change from baseline       Perception     Praxis      Pertinent Vitals/Pain Pain Assessment: Faces Faces Pain Scale: Hurts whole lot Pain Location: L shoulder Pain Descriptors / Indicators: Aching;Sore Pain Intervention(s): Monitored during session     Hand Dominance Right   Extremity/Trunk Assessment Upper Extremity Assessment Upper Extremity Assessment: LUE deficits/detail LUE Deficits / Details: no functional use of shoulder due to pain, scheduled to starte OP therapy tomorrow LUE Coordination: decreased gross motor   Lower Extremity Assessment Lower Extremity Assessment: Overall WFL for tasks assessed   Cervical / Trunk Assessment Cervical / Trunk Assessment: Kyphotic   Communication Communication Communication: No difficulties   Cognition Arousal/Alertness: Awake/alert Behavior During Therapy: WFL for tasks assessed/performed Overall Cognitive Status: Within Functional Limits for tasks assessed                                     General Comments       Exercises     Shoulder Instructions      Home Living Family/patient expects to be discharged to:: Private residence Living Arrangements: Children(son) Available Help at Discharge:  Family;Friend(s);Available PRN/intermittently Type of Home: House Home Access: Stairs to enter [employer] of Steps: 2 Entrance Stairs-Rails: Right Home Layout: One level     Bathroom Shower/Tub: Teacher, early years/pre: Standard     Home Equipment: Cane - single point;Shower seat;Wheelchair - [employer] - 2 wheels;Bedside commode   Additional Comments: Typically uses cane for gait, sits for bathing      Prior Functioning/Environment Level of Independence: Needs assistance  Gait / Transfers Assistance Needed: use of SPC for  mobility ADL's / Homemaking Assistance Needed: family assists with  arranging  shower equipment   Comments: pt arranges transportation and pays her own bills        OT Problem List: Pain      OT Treatment/Interventions:      OT Goals(Current goals can be found in the care plan section) Acute Rehab OT Goals Patient Stated Goal: decreased pain  OT Frequency:     Barriers to D/C:            Co-evaluation              AM-PAC OT "6 Clicks" Daily Activity     Outcome Measure Help from another person eating meals?: None Help from another person taking care of personal grooming?: None Help from another person toileting, which includes using toliet, bedpan, or urinal?: None Help from another person bathing (including washing, rinsing, drying)?: None Help from another person to put on and taking off regular upper body clothing?: None Help from another person to put on and taking off regular lower body clothing?: None 6 Click Score: 24   End of Session    Activity Tolerance: Patient tolerated treatment well Patient left: in chair;with call bell/phone within reach;with chair alarm set  OT Visit Diagnosis: Pain                Time: 8185-6314 OT Time Calculation (min): 35 min Charges:  OT General Charges $OT Visit: 1 Visit OT Evaluation $OT Eval Moderate Complexity: 1 Mod OT Treatments $Self Care/Home Management : 8-22 mins  Nestor Lewandowsky, OTR/L Acute Rehabilitation Services Pager: [phone] Office: [phone]  Alejandra Hernandez 07/12/2019, 9:50 AM

## 2019-07-12 NOTE — Progress Notes (Signed)
Patient alert and oriented x4. Will continue to monitor the patient.

## 2019-07-12 NOTE — Progress Notes (Signed)
Patient alert and oriented ambulate in hallway with cane,  no dizziness, patient stated she is at her baseline. Iv and tele d/c , d/c instruction explain and given to the patient, all questions answered  Patient verbalized understanding. Will d/c patient home per order

## 2019-07-12 NOTE — Progress Notes (Signed)
Patient was alert and oriented X4, she was very pleasant, more aroused. Patient was assisted to the bedside commode where she voided 800 ml per NT. I was able to assist patient to order breakfast over the phone. She is in bed with no complains at this time. MRI was done last night.

## 2019-07-12 NOTE — Progress Notes (Signed)
CSW consulted with patient who reports she is going home in a few minutes, denied any home health needs or equipment needs at home at this time.   [place], [place]

## 2019-07-12 NOTE — Progress Notes (Signed)
PROGRESS NOTE    Alejandra Hernandez  VHQ:[id number] DOB: 07-25-1956 DOA: 07/10/2019 PCP: Antonietta Jewel, MD    Brief Narrative:   Alejandra Hernandez is a 63 year old female with past medical history of CAD, DVT on Xarelto, insulin-dependent diabetes mellitus, stage II chronic kidney disease and recent admission for left upper arm abscess with concern for myositis/osteo-status post I&D presented to the emergency room on the early morning of 8/16 with complaints of chest pain as it was generalized aches and pains plus nausea and vomiting.  Patient states symptoms started 2 days ago.  She describes aches including across her chest, anterior aspect of both thighs, posterior calves, and posterior trapezius bilaterally.  Patient had a recent nuclear medicine stress test in June which noted no significant ischemia and probable normal perfusion with soft tissue attenuation.  Her EKG on admission noted some T wave abnormalities more prominent than prior, but otherwise unremarkable.  Troponins were on the high end of normal.  Assessment & Plan:   Principal Problem:   Chest pain Active Problems:   Iron deficiency anemia   Hyponatremia   Essential hypertension   PAD (peripheral artery disease) (HCC)   Chronic diastolic CHF (congestive heart failure) (HCC)   History of DVT (deep vein thrombosis)   CKD (chronic kidney disease), stage III (HCC)   Uncontrolled insulin dependent diabetes mellitus (HCC)   Troponin level elevated   COPD, mild (HCC)   Complaints of total body pain   Atypical chest pain Patient presenting with complaints of chest pain and "total body pain".  Recent nuclear medicine stress test in June 2020 with no significant ischemia and probable normal perfusion with soft tissue attenuation.  EKG on admission with T wave inversions to 3 aVF, V3 through V6 which are similar to previous EKGs.   --High-sensitivity troponin 17-->18-->23; which are high end of normal --No concerning events noted on  telemetry --Continues to be chest pain-free --Suspect musculoskeletal component such as fibromyalgia --Continue to monitor on telemetry --Lidocaine patch for low back pain  Bilateral upper extremity tremors: resolved Confusion: resolved Nursing concerned with bilateral hand shaking and confusion earlier this morning.  Blood glucose was noted to be low at 45 and received amp of D50 morning of 07/11/2019.  CT head and MR brain without acute finding. --discontinued Reglan and and baclofen; as this can also be a contributing factor --Monitor glucose closely  Essential hypertension BP 102/54, well controlled --Continue clonidine 0.1 mg p.o. twice daily, amlodipine 10 mg p.o. daily, imdur 90mg  po daily  HLD: continue statin  Peripheral artery disease: Continue aspirin and statin  Chronic diastolic congestive heart failure, compensated Echocardiogram 01/17/2019 with EF greater than 65%, moderate LVH, and diastolic dysfunction. --Continue beta-blocker, statin --patient has allergy to ACE inhibitor --Heart healthy diet --Strict I's and O's and daily weights  Hx DVT: Continue anticoagulation with Xarelto  CKD stage III Baseline creatinine over the past year 1.05- 1.64. --Cr 1.48-->1.03-->2.64-->1.44 --s/p NS bolus yesterday --Repeat BMP in the a.m.  Insulin-dependent diabetes mellitus, poorly controlled Hemoglobin A1c 13.1 on 06/06/2019.  Home regimen includes Tresiba 68u qHS and Humalog 1-10u sliding scale --Hypoglycemic event AM of 8/17 with glucose 45, s/p amp D50 --Lantus intially discontinued on 8/17, blood glucose rising given increased appetite --Continue NovoLog insulin sliding scale for coverage with at bedtime coverage --Restart Lantus at lower dose of 10u QHS tonight --Close monitoring CBGs qAC/HS with ISS for further coverage  Weakness/debility Evaluated by PT with recommendations of SNF --Social work for coordination  DVT prophylaxis: Xarelto Code Status: Full  code Family Communication: None Disposition Plan: Continue inpatient, pending SNF placement   Consultants:   None  Procedures:   None  Antimicrobials:   None   Subjective: Patient seen and examined at bedside, resting comfortably in bed and eating breakfast.  States that her confusion and hand "shaking" have completely resolved.  Discussed with her CT head and MRI findings which were unrevealing.  Discussed with her that likely etiology of her symptoms was from her hypoglycemic event.  Seen by PT yesterday with recommendations of SNF placement.  No other concerns or complaints this morning.  Denies headache, no fever/chills/night sweats, no nausea/vomit/diarrhea, no chest pain, no palpitations, no shortness of breath, no abdominal pain, no cough/congestion, no issues with bowel/bladder function, no paresthesias.  No acute events overnight per nursing staff.  Objective: Vitals:   07/11/19 1147 07/11/19 1943 07/12/19 0440 07/12/19 0547  BP: (!) 134/56 (!) 161/78 (!) 141/64   Pulse: 65 81 94   Resp: 18 20 20 19   Temp: 98.3 F (36.8 C) 98.8 F (37.1 C) 98.9 F (37.2 C)   TempSrc: Oral Oral Oral   SpO2: 97% 100% 100%   Weight:   59 kg   Height:        Intake/Output Summary (Last 24 hours) at 07/12/2019 1025 Last data filed at 07/12/2019 0855 Gross per 24 hour  Intake 780 ml  Output 1350 ml  Net -570 ml   Filed Weights   07/10/19 0509 07/11/19 0407 07/12/19 0440  Weight: 54.4 kg 53.8 kg 59 kg    Examination:  General exam: Appears calm and comfortable, eating breakfast, pleasant Respiratory system: Clear to auscultation. Respiratory effort normal.  Oxygenating well on room air Cardiovascular system: S1 & S2 heard, RRR. No JVD, murmurs, rubs, gallops or clicks. No pedal edema. Gastrointestinal system: Abdomen is nondistended, soft and nontender. No organomegaly or masses felt. Normal bowel sounds heard. Central nervous system: Alert and oriented.  No focal deficits  appreciated Extremities: Symmetric 5 x 5 power. Skin: No rashes, lesions or ulcers Psychiatry: Judgement and insight appear normal. Mood & affect appropriate.     Data Reviewed: I have personally reviewed following labs and imaging studies  CBC: Recent Labs  Lab 07/10/19 0049 07/11/19 0945  WBC 9.3 10.0  NEUTROABS 4.9  --   HGB 10.7* 10.4*  HCT 35.2* 34.1*  MCV 70.8* 71.5*  PLT 327 983   Basic Metabolic Panel: Recent Labs  Lab 07/10/19 0049 07/11/19 0945 07/12/19 0435  NA 132* 132* 133*  K 3.8 4.2 4.8  CL 99 100 102  CO2 18* 19* 17*  GLUCOSE 205* 279* 261*  BUN 11 22 21   CREATININE 1.03* 2.64* 1.44*  CALCIUM 9.7 9.0 9.0  MG  --   --  1.4*   GFR: Estimated Creatinine Clearance: 33.5 mL/min (A) (by C-G formula based on SCr of 1.44 mg/dL (H)). Liver Function Tests: Recent Labs  Lab 07/10/19 0049  AST 17  ALT 15  ALKPHOS 124  BILITOT 1.0  PROT 8.0  ALBUMIN 4.1   No results for input(s): LIPASE, AMYLASE in the last 168 hours. No results for input(s): AMMONIA in the last 168 hours. Coagulation Profile: Recent Labs  Lab 07/10/19 0049  INR 2.5*   Cardiac Enzymes: No results for input(s): CKTOTAL, CKMB, CKMBINDEX, TROPONINI in the last 168 hours. BNP (last 3 results) Recent Labs    12/14/18 0935  PROBNP 299*   HbA1C: No results for input(s): HGBA1C  in the last 72 hours. CBG: Recent Labs  Lab 07/11/19 2041 07/12/19 0005 07/12/19 0432 07/12/19 0601 07/12/19 0828  GLUCAP 225* 201* 248* 261* 185*   Lipid Profile: No results for input(s): CHOL, HDL, LDLCALC, TRIG, CHOLHDL, LDLDIRECT in the last 72 hours. Thyroid Function Tests: No results for input(s): TSH, T4TOTAL, FREET4, T3FREE, THYROIDAB in the last 72 hours. Anemia Panel: Recent Labs    07/10/19 0935  FERRITIN 382*   Sepsis Labs: Recent Labs  Lab 07/10/19 0049 07/10/19 0307  LATICACIDVEN 1.0 0.9    Recent Results (from the past 240 hour(s))  Blood Culture (routine x 2)      Status: None (Preliminary result)   Collection Time: 07/10/19  1:00 AM   Specimen: BLOOD RIGHT HAND  Result Value Ref Range Status   Specimen Description BLOOD RIGHT HAND  Final   Special Requests   Final    BOTTLES DRAWN AEROBIC AND ANAEROBIC Blood Culture results may not be optimal due to an inadequate volume of blood received in culture bottles   Culture   Final    NO GROWTH 2 DAYS Performed at [facility], [place] [address], [place], [place] [address]    Report Status PENDING  Incomplete  Blood Culture (routine x 2)     Status: None (Preliminary result)   Collection Time: 07/10/19  1:10 AM   Specimen: BLOOD RIGHT HAND  Result Value Ref Range Status   Specimen Description BLOOD RIGHT HAND  Final   Special Requests   Final    BOTTLES DRAWN AEROBIC AND ANAEROBIC Blood Culture results may not be optimal due to an inadequate volume of blood received in culture bottles   Culture   Final    NO GROWTH 2 DAYS Performed at [facility], [place] [address] [place], [place] [address]    Report Status PENDING  Incomplete  SARS Coronavirus 2 [facility] order, Performed in [facility] hospital lab) Nasopharyngeal Nasopharyngeal Swab     Status: None   Collection Time: 07/10/19  1:22 AM   Specimen: Nasopharyngeal Swab  Result Value Ref Range Status   SARS Coronavirus 2 NEGATIVE NEGATIVE Final    Comment: (NOTE) If result is NEGATIVE SARS-CoV-2 target nucleic acids are NOT DETECTED. The SARS-CoV-2 RNA is generally detectable in upper and lower  respiratory specimens during the acute phase of infection. The lowest  concentration of SARS-CoV-2 viral copies this assay can detect is 250  copies / mL. A negative result does not preclude SARS-CoV-2 infection  and should not be used as the sole basis for treatment or other  patient management decisions.  A negative result may occur with  improper specimen collection / handling, submission of specimen other  than nasopharyngeal swab, presence  of viral mutation(s) within the  areas targeted by this assay, and inadequate number of viral copies  (<250 copies / mL). A negative result must be combined with clinical  observations, patient history, and epidemiological information. If result is POSITIVE SARS-CoV-2 target nucleic acids are DETECTED. The SARS-CoV-2 RNA is generally detectable in upper and lower  respiratory specimens dur ing the acute phase of infection.  Positive  results are indicative of active infection with SARS-CoV-2.  Clinical  correlation with patient history and other diagnostic information is  necessary to determine patient infection status.  Positive results do  not rule out bacterial infection or co-infection with other viruses. If result is PRESUMPTIVE POSTIVE SARS-CoV-2 nucleic acids MAY BE PRESENT.   A presumptive positive  result was obtained on the submitted specimen  and confirmed on repeat testing.  While 2019 novel coronavirus  (SARS-CoV-2) nucleic acids may be present in the submitted sample  additional confirmatory testing may be necessary for epidemiological  and / or clinical management purposes  to differentiate between  SARS-CoV-2 and other Sarbecovirus currently known to infect humans.  If clinically indicated additional testing with an alternate test  methodology [id number]) is advised. The SARS-CoV-2 RNA is generally  detectable in upper and lower respiratory sp ecimens during the acute  phase of infection. The expected result is Negative. Fact Sheet for Patients:  StrictlyIdeas.no Fact Sheet for Healthcare Providers: BankingDealers.co.za This test is not yet approved or cleared by the [place] FDA and has been authorized for detection and/or diagnosis of SARS-CoV-2 by FDA under an Emergency Use Authorization (EUA).  This EUA will remain in effect (meaning this test can be used) for the duration of the COVID-19 declaration under Section  564(b)(1) of the Act, 21 U.S.C. section 360bbb-3(b)(1), unless the authorization is terminated or revoked sooner. Performed at [facility], [place] [address] [place], [place] [address]          Radiology Studies: Ct Head Wo Contrast  Result Date: 07/11/2019 CLINICAL DATA:  Hand weakness EXAM: CT HEAD WITHOUT CONTRAST TECHNIQUE: Contiguous axial images were obtained from the base of the skull through the vertex without intravenous contrast. COMPARISON:  04/13/2019 FINDINGS: Brain: Mild atrophic changes are noted. No findings to suggest acute hemorrhage, acute infarction or space-occupying mass lesion are noted. Mild chronic white matter ischemic changes are seen. Vascular: No hyperdense vessel or unexpected calcification. Skull: Normal. Negative for fracture or focal lesion. Sinuses/Orbits: No acute finding. Other: None. IMPRESSION: Chronic atrophic and ischemic changes without acute abnormality. Electronically Signed   By: Inez Catalina M.D.   On: 07/11/2019 10:34   Mr Brain Wo Contrast  Result Date: 07/11/2019 CLINICAL DATA:  Initial evaluation for acute ataxia, stroke suspected. EXAM: MRI HEAD WITHOUT CONTRAST TECHNIQUE: Multiplanar, multiecho pulse sequences of the brain and surrounding structures were obtained without intravenous contrast. COMPARISON:  Prior head CT from earlier the same day. FINDINGS: Brain: Examination degraded by motion artifact. Cerebral volume within normal limits for patient age. Mild chronic microvascular ischemic changes for age. No abnormal foci of restricted diffusion to suggest acute or subacute ischemia. Gray-white matter differentiation well maintained. No encephalomalacia to suggest chronic infarction. No foci of susceptibility artifact to suggest acute or chronic intracranial hemorrhage. No mass lesion, midline shift or mass effect. No hydrocephalus. No extra-axial fluid collection. Major dural sinuses are grossly patent. Pituitary gland and suprasellar  region are normal. Midline structures intact and normal. Vascular: Major intracranial vascular flow voids well maintained and normal in appearance. Skull and upper cervical spine: Craniocervical junction normal. Postsurgical changes partially visualize within the upper cervical spine. Bone marrow signal intensity normal. No scalp soft tissue abnormality. Sinuses/Orbits: Patient status post bilateral ocular lens replacement. Globes and orbital soft tissues demonstrate no acute finding. Paranasal sinuses are clear. No mastoid effusion. Inner ear structures normal. Other: None. IMPRESSION: 1. No acute intracranial infarct or other abnormality identified. 2. Mild age-related cerebral atrophy with chronic small vessel ischemic disease. Electronically Signed   By: Jeannine Boga M.D.   On: 07/11/2019 22:10        Scheduled Meds: . amLODipine  10 mg Oral QHS  . aspirin EC  81 mg Oral Daily  . cloNIDine  0.1 mg Oral BID  . gabapentin  300 mg Oral TID  . insulin aspart  0-5 Units Subcutaneous QHS  . insulin aspart  0-9 Units Subcutaneous TID WC  . insulin glargine  10 Units Subcutaneous QHS  . isosorbide mononitrate  90 mg Oral Daily  . lidocaine  1 patch Transdermal Q24H  . LORazepam  1 mg Intravenous On Call  . pantoprazole  40 mg Oral BID  . potassium chloride SA  10 mEq Oral Daily  . rivaroxaban  20 mg Oral Q supper  . rosuvastatin  40 mg Oral q1800  . sodium chloride flush  3 mL Intravenous Q12H   Continuous Infusions: . sodium chloride       LOS: 1 day    Time spent: 35 minutes spent on chart review, personally reviewing all imaging studies and labs, discussion with nursing staff, consultants, updating family and interview/physical exam; more than 50% of that time was spent in counseling and/or coordination of care.    Teaghan Formica J [place] DO [facility] Pager [phone]  If 7PM-7AM, please contact night-coverage [email or web address] Password [facility] 07/12/2019, 10:25 AM
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
## 2019-07-12 NOTE — Discharge Summary (Signed)
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 (EUA).  This EUA will remain in effect (  meaning this test can be used) for the duration of the COVID-19 declaration under Section 564(b)(1) of the Act, 21 U.S.C. section 360bbb-3(b)(1), unless the authorization is terminated or revoked sooner. Performed at [facility], [place] [address], [place],  [address]      Time coordinating discharge: Over 30 minutes  SIGNED:    J [place] DO  [facility] 07/12/2019, 3:06 PM

## 2019-07-13 ENCOUNTER — Ambulatory Visit: Payer: Medicare Other | Admitting: Physical Therapy

## 2019-07-15 LAB — CULTURE, BLOOD (ROUTINE X 2)
Culture: NO GROWTH
Culture: NO GROWTH

## 2019-07-18 ENCOUNTER — Other Ambulatory Visit: Payer: Self-pay | Admitting: *Deleted

## 2019-07-19 ENCOUNTER — Telehealth: Payer: Self-pay | Admitting: Radiology

## 2019-07-19 ENCOUNTER — Telehealth (INDEPENDENT_AMBULATORY_CARE_PROVIDER_SITE_OTHER): Payer: Medicare Other | Admitting: Cardiology

## 2019-07-19 ENCOUNTER — Other Ambulatory Visit: Payer: Self-pay

## 2019-07-19 DIAGNOSIS — I5032 Chronic diastolic (congestive) heart failure: Secondary | ICD-10-CM

## 2019-07-19 DIAGNOSIS — I10 Essential (primary) hypertension: Secondary | ICD-10-CM | POA: Diagnosis not present

## 2019-07-19 DIAGNOSIS — I2510 Atherosclerotic heart disease of native coronary artery without angina pectoris: Secondary | ICD-10-CM

## 2019-07-19 DIAGNOSIS — Z86718 Personal history of other venous thrombosis and embolism: Secondary | ICD-10-CM

## 2019-07-19 DIAGNOSIS — E785 Hyperlipidemia, unspecified: Secondary | ICD-10-CM

## 2019-07-19 DIAGNOSIS — I739 Peripheral vascular disease, unspecified: Secondary | ICD-10-CM

## 2019-07-19 DIAGNOSIS — I6523 Occlusion and stenosis of bilateral carotid arteries: Secondary | ICD-10-CM

## 2019-07-19 DIAGNOSIS — R002 Palpitations: Secondary | ICD-10-CM
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
## 2019-07-19 NOTE — Telephone Encounter (Signed)
Enrolled patient for a 30 Day [employer] Event monitor. Patient is aware monitor should arrive in 3-4 days.

## 2019-07-19 NOTE — Patient Instructions (Signed)
Medication Instructions:  The current medical regimen is effective;  continue present plan and medications.  If you need a refill on your cardiac medications before your next appointment, please call your pharmacy.   Testing/Procedures: Your physician has recommended that you wear an event monitor for 30 days. Event monitors are medical devices that record the heart's electrical activity. Doctors most often [place] these monitors to diagnose arrhythmias. Arrhythmias are problems with the speed or rhythm of the heartbeat. The monitor is a small, portable device. You can wear one while you do your normal daily activities. This is usually used to diagnose what is causing palpitations/syncope (passing out).  You will be contacted by our office to be scheduled.  Your physician has requested that you have a carotid duplex in February 2021. This test is an ultrasound of the carotid arteries in your neck. It looks at blood flow through these arteries that supply the brain with blood. Allow one hour for this exam. There are no restrictions or special instructions.  This testing will be scheduled closer to your due date.  You have been referred to Vascular and Vein to establish and further evaluate your carotid artery disease.  You will be contacted to be scheduled for this appointment.  Follow-Up: Follow up in 1 year with Dr. Radford Pax.  You will receive a letter in the mail 2 months before you are due.  Please call us when you receive this letter to schedule your follow up appointment.  Thank you for choosing [facility]!!

## 2019-07-20 ENCOUNTER — Encounter (HOSPITAL_COMMUNITY): Payer: Self-pay

## 2019-07-20 ENCOUNTER — Telehealth: Payer: Self-pay | Admitting: Cardiology

## 2019-07-20 ENCOUNTER — Emergency Department (HOSPITAL_COMMUNITY)
Admission: EM | Admit: 2019-07-20 | Discharge: 2019-07-21 | Disposition: A | Discharge status: Home / Self Care | Payer: Medicare Other | Attending: Emergency Medicine | Admitting: Emergency Medicine

## 2019-07-20 ENCOUNTER — Other Ambulatory Visit: Payer: Self-pay

## 2019-07-20 DIAGNOSIS — N390 Urinary tract infection, site not specified: Secondary | ICD-10-CM

## 2019-07-20 DIAGNOSIS — K5900 Constipation, unspecified: Secondary | ICD-10-CM | POA: Insufficient documentation

## 2019-07-20 DIAGNOSIS — R1031 Right lower quadrant pain: Secondary | ICD-10-CM | POA: Diagnosis present

## 2019-07-20 DIAGNOSIS — K5903 Drug induced constipation: Secondary | ICD-10-CM

## 2019-07-20 LAB — CBC
HCT: 31.3 % — ABNORMAL LOW (ref 36.0–46.0)
Hemoglobin: 9.7 g/dL — ABNORMAL LOW (ref 12.0–15.0)
MCH: 21.7 pg — ABNORMAL LOW (ref 26.0–34.0)
MCHC: 31 g/dL (ref 30.0–36.0)
MCV: 69.9 fL — ABNORMAL LOW (ref 80.0–100.0)
Platelets: 400 10*3/uL (ref 150–400)
RBC: 4.48 MIL/uL (ref 3.87–5.11)
RDW: 16.4 % — ABNORMAL HIGH (ref 11.5–15.5)
WBC: 8.7 10*3/uL (ref 4.0–10.5)
nRBC: 0 % (ref 0.0–0.2)

## 2019-07-20 LAB — COMPREHENSIVE METABOLIC PANEL
ALT: 15 U/L (ref 0–44)
AST: 19 U/L (ref 15–41)
Albumin: 3.9 g/dL (ref 3.5–5.0)
Alkaline Phosphatase: 101 U/L (ref 38–126)
Anion gap: 12 (ref 5–15)
BUN: 8 mg/dL (ref 8–23)
CO2: 23 mmol/L (ref 22–32)
Calcium: 9.3 mg/dL (ref 8.9–10.3)
Chloride: 96 mmol/L — ABNORMAL LOW (ref 98–111)
Creatinine, Ser: 1.02 mg/dL — ABNORMAL HIGH (ref 0.44–1.00)
GFR calc Af Amer: >60 mL/min (ref >60)
GFR calc non Af Amer: 59 mL/min — ABNORMAL LOW (ref >60)
Glucose, Bld: 206 mg/dL — ABNORMAL HIGH (ref 70–99)
Potassium: 3.7 mmol/L (ref 3.5–5.1)
Sodium: 131 mmol/L — ABNORMAL LOW (ref 135–145)
Total Bilirubin: 1 mg/dL (ref 0.3–1.2)
Total Protein: 7.5 g/dL (ref 6.5–8.1)

## 2019-07-20 LAB — URINALYSIS, ROUTINE W REFLEX MICROSCOPIC
Bilirubin Urine: NEGATIVE
Glucose, UA: NEGATIVE mg/dL
Hgb urine dipstick: NEGATIVE
Ketones, ur: NEGATIVE mg/dL
Nitrite: NEGATIVE
Protein, ur: 100 mg/dL — AB
Specific Gravity, Urine: 1.01 (ref 1.005–1.030)
pH: 7 (ref 5.0–8.0)

## 2019-07-20 LAB — LIPASE, BLOOD: Lipase: 26 U/L (ref 11–51)

## 2019-07-20 LAB — TROPONIN I (HIGH SENSITIVITY)
Troponin I (High Sensitivity): 15 ng/L (ref <18)
Troponin I (High Sensitivity): 15 ng/L (ref <18)

## 2019-07-20 MED ORDER — SODIUM CHLORIDE 0.9% FLUSH: 3.0000 mL | Freq: Once | INTRAVENOUS | Status: DC

## 2019-07-20 NOTE — ED Triage Notes (Signed)
Pt arrives POV for eval of abd pain, back pain, N/V and general malaise x 2 days. Denies hx of same

## 2019-07-20 NOTE — Telephone Encounter (Signed)
Agree with recommendations.  

## 2019-07-20 NOTE — Telephone Encounter (Signed)
New Message   Pt c/o BP issue:  1. What are your last 5 BP readings? 189/76, 211/86  2. Are you having any other symptoms (ex. Dizziness, headache, blurred vision, passed out)? Nausea, vomiting. Patient loss 4lbs in two days.   3. What is your medication issue? no   Melissa with [employer] called on behalf of patient. Melissa advised patient has taken her medication today and she does have a GI complaint. Melissa did instruct patient if necessary to go to ER.

## 2019-07-20 NOTE — Telephone Encounter (Signed)
I spoke with pt. She reports she has been nauseated and sick on her stomach for 3 days.  Has back pain but this is not new. Very little food/fluid intake for last 3 days. No vomiting today. She rechecked BP while on phone with me and it is now 166/90.  I advised her to contact primary care for recommendations for ongoing nausea.  I told her if symptoms worsened she should call 911 for transport to ED.

## 2019-07-21 ENCOUNTER — Emergency Department (HOSPITAL_COMMUNITY): Payer: Medicare Other

## 2019-07-21 ENCOUNTER — Ambulatory Visit: Payer: Medicare Other | Admitting: Physical Therapy

## 2019-07-21 ENCOUNTER — Encounter (HOSPITAL_COMMUNITY): Payer: Self-pay | Admitting: Radiology

## 2019-07-21 DIAGNOSIS — K5900 Constipation, unspecified: Secondary | ICD-10-CM | POA: Diagnosis not present

## 2019-07-21 MED ORDER — IOHEXOL 300 MG/ML  SOLN
100.0000 mL | Freq: Once | INTRAMUSCULAR | Status: AC | PRN
  Administered 2019-07-21: 100 mL via INTRAVENOUS

## 2019-07-21 MED ORDER — CEPHALEXIN 500 MG PO CAPS: 500.0000 mg | ORAL_CAPSULE | Freq: Two times a day (BID) | ORAL | 0 refills | Status: AC

## 2019-07-21 MED ORDER — POLYETHYLENE GLYCOL 3350 17 GM/SCOOP PO POWD: ORAL | 0 refills | Status: DC

## 2019-07-21 MED ORDER — MORPHINE SULFATE (PF) 4 MG/ML IV SOLN
INTRAVENOUS | Status: AC
  Administered 2019-07-21: 04:00:00 4 mg
  Filled 2019-07-21: qty 1

## 2019-07-21 MED ORDER — SODIUM CHLORIDE 0.9 % IV BOLUS: 1000.0000 mL | Freq: Once | INTRAVENOUS | Status: DC

## 2019-07-21 MED ORDER — ONDANSETRON HCL 4 MG/2ML IJ SOLN
INTRAMUSCULAR | Status: AC
  Administered 2019-07-21: 04:00:00 4 mg
  Filled 2019-07-21: qty 2

## 2019-07-21 MED ORDER — CEPHALEXIN 250 MG PO CAPS
500.0000 mg | ORAL_CAPSULE | Freq: Once | ORAL | Status: AC
  Administered 2019-07-21: 05:00:00 500 mg via ORAL
  Filled 2019-07-21: qty 2

## 2019-07-21 NOTE — ED Notes (Signed)
Pt discharged from ED; instructions provided and scripts given; Pt encouraged to return to ED if symptoms worsen and to f/u with PCP; Pt verbalized understanding of all instructions 
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
## 2019-07-22 LAB — URINE CULTURE: Culture: <10000 — AB

## 2019-07-26 ENCOUNTER — Ambulatory Visit (INDEPENDENT_AMBULATORY_CARE_PROVIDER_SITE_OTHER): Payer: Medicare Other

## 2019-07-26 DIAGNOSIS — R002 Palpitations: Secondary | ICD-10-CM

## 2019-07-26 DIAGNOSIS — R079 Chest pain, unspecified: Secondary | ICD-10-CM | POA: Diagnosis not present

## 2019-08-02 ENCOUNTER — Emergency Department (HOSPITAL_COMMUNITY)
Admission: EM | Admit: 2019-08-02 | Discharge: 2019-08-03 | Disposition: A | Discharge status: Home / Self Care | Payer: Medicare Other | Attending: Emergency Medicine | Admitting: Emergency Medicine

## 2019-08-02 ENCOUNTER — Emergency Department (HOSPITAL_COMMUNITY): Payer: Medicare Other

## 2019-08-02 ENCOUNTER — Ambulatory Visit: Payer: Medicare Other | Admitting: Physical Therapy

## 2019-08-02 DIAGNOSIS — J449 Chronic obstructive pulmonary disease, unspecified: Secondary | ICD-10-CM | POA: Diagnosis not present

## 2019-08-02 DIAGNOSIS — D125 Benign neoplasm of sigmoid colon: Secondary | ICD-10-CM | POA: Insufficient documentation

## 2019-08-02 DIAGNOSIS — D124 Benign neoplasm of descending colon: Secondary | ICD-10-CM | POA: Diagnosis not present

## 2019-08-02 DIAGNOSIS — I2510 Atherosclerotic heart disease of native coronary artery without angina pectoris: Secondary | ICD-10-CM | POA: Insufficient documentation

## 2019-08-02 DIAGNOSIS — E1122 Type 2 diabetes mellitus with diabetic chronic kidney disease: Secondary | ICD-10-CM | POA: Diagnosis not present

## 2019-08-02 DIAGNOSIS — R109 Unspecified abdominal pain: Secondary | ICD-10-CM

## 2019-08-02 DIAGNOSIS — Z955 Presence of coronary angioplasty implant and graft: Secondary | ICD-10-CM | POA: Insufficient documentation

## 2019-08-02 DIAGNOSIS — K5900 Constipation, unspecified: Secondary | ICD-10-CM | POA: Diagnosis not present

## 2019-08-02 DIAGNOSIS — Z7982 Long term (current) use of aspirin: Secondary | ICD-10-CM | POA: Insufficient documentation

## 2019-08-02 DIAGNOSIS — R638 Other symptoms and signs concerning food and fluid intake: Secondary | ICD-10-CM | POA: Insufficient documentation

## 2019-08-02 DIAGNOSIS — I5032 Chronic diastolic (congestive) heart failure: Secondary | ICD-10-CM | POA: Diagnosis not present

## 2019-08-02 DIAGNOSIS — Z794 Long term (current) use of insulin: Secondary | ICD-10-CM | POA: Diagnosis not present

## 2019-08-02 DIAGNOSIS — Z79899 Other long term (current) drug therapy: Secondary | ICD-10-CM | POA: Diagnosis not present

## 2019-08-02 DIAGNOSIS — Z7901 Long term (current) use of anticoagulants: Secondary | ICD-10-CM | POA: Insufficient documentation

## 2019-08-02 DIAGNOSIS — R112 Nausea with vomiting, unspecified: Secondary | ICD-10-CM | POA: Insufficient documentation

## 2019-08-02 DIAGNOSIS — I130 Hypertensive heart and chronic kidney disease with heart failure and stage 1 through stage 4 chronic kidney disease, or unspecified chronic kidney disease: Secondary | ICD-10-CM | POA: Insufficient documentation

## 2019-08-02 DIAGNOSIS — N183 Chronic kidney disease, stage 3 (moderate): Secondary | ICD-10-CM | POA: Insufficient documentation

## 2019-08-02 DIAGNOSIS — Z87891 Personal history of nicotine dependence: Secondary | ICD-10-CM | POA: Insufficient documentation

## 2019-08-02 LAB — CBC WITH DIFFERENTIAL/PLATELET
Abs Immature Granulocytes: 0.03 10*3/uL (ref 0.00–0.07)
Basophils Absolute: 0 10*3/uL (ref 0.0–0.1)
Basophils Relative: 1 %
Eosinophils Absolute: 0.1 10*3/uL (ref 0.0–0.5)
Eosinophils Relative: 2 %
HCT: 33.8 % — ABNORMAL LOW (ref 36.0–46.0)
Hemoglobin: 10.4 g/dL — ABNORMAL LOW (ref 12.0–15.0)
Immature Granulocytes: 0 %
Lymphocytes Relative: 45 %
Lymphs Abs: 3.6 10*3/uL (ref 0.7–4.0)
MCH: 21.8 pg — ABNORMAL LOW (ref 26.0–34.0)
MCHC: 30.8 g/dL (ref 30.0–36.0)
MCV: 70.7 fL — ABNORMAL LOW (ref 80.0–100.0)
Monocytes Absolute: 0.6 10*3/uL (ref 0.1–1.0)
Monocytes Relative: 7 %
Neutro Abs: 3.6 10*3/uL (ref 1.7–7.7)
Neutrophils Relative %: 45 %
Platelets: 418 10*3/uL — ABNORMAL HIGH (ref 150–400)
RBC: 4.78 MIL/uL (ref 3.87–5.11)
RDW: 16.4 % — ABNORMAL HIGH (ref 11.5–15.5)
WBC: 8 10*3/uL (ref 4.0–10.5)
nRBC: 0 % (ref 0.0–0.2)

## 2019-08-02 LAB — URINALYSIS, ROUTINE W REFLEX MICROSCOPIC
Bacteria, UA: NONE SEEN
Bilirubin Urine: NEGATIVE
Glucose, UA: 50 mg/dL — AB
Hgb urine dipstick: NEGATIVE
Ketones, ur: 5 mg/dL — AB
Nitrite: NEGATIVE
Protein, ur: >=300 mg/dL — AB
Specific Gravity, Urine: 1.011 (ref 1.005–1.030)
pH: 6 (ref 5.0–8.0)

## 2019-08-02 LAB — LACTIC ACID, PLASMA: Lactic Acid, Venous: 1.3 mmol/L (ref 0.5–1.9)

## 2019-08-02 LAB — COMPREHENSIVE METABOLIC PANEL
ALT: 17 U/L (ref 0–44)
AST: 23 U/L (ref 15–41)
Albumin: 4.1 g/dL (ref 3.5–5.0)
Alkaline Phosphatase: 99 U/L (ref 38–126)
Anion gap: 14 (ref 5–15)
BUN: 13 mg/dL (ref 8–23)
CO2: 21 mmol/L — ABNORMAL LOW (ref 22–32)
Calcium: 8.8 mg/dL — ABNORMAL LOW (ref 8.9–10.3)
Chloride: 92 mmol/L — ABNORMAL LOW (ref 98–111)
Creatinine, Ser: 1.24 mg/dL — ABNORMAL HIGH (ref 0.44–1.00)
GFR calc Af Amer: 54 mL/min — ABNORMAL LOW (ref >60)
GFR calc non Af Amer: 47 mL/min — ABNORMAL LOW (ref >60)
Glucose, Bld: 150 mg/dL — ABNORMAL HIGH (ref 70–99)
Potassium: 3.3 mmol/L — ABNORMAL LOW (ref 3.5–5.1)
Sodium: 127 mmol/L — ABNORMAL LOW (ref 135–145)
Total Bilirubin: 0.7 mg/dL (ref 0.3–1.2)
Total Protein: 7.3 g/dL (ref 6.5–8.1)

## 2019-08-02 MED ORDER — SODIUM CHLORIDE 0.9 % IV BOLUS
1000.0000 mL | Freq: Once | INTRAVENOUS | Status: AC
  Administered 2019-08-02: 21:00:00 1000 mL via INTRAVENOUS
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
## 2019-08-02 NOTE — ED Triage Notes (Signed)
Reports bilateral lower back pain and lower abdominal pain with vomiting since Thursday.  Reports fever on Saturday, but has not had one since.  Has not eaten in a couple days.

## 2019-08-03 MED ORDER — MAGNESIUM CITRATE PO SOLN: 1.0000 | Freq: Once | ORAL | 0 refills | Status: AC

## 2019-08-03 MED ORDER — MINERAL OIL RE ENEM: 1.0000 | ENEMA | Freq: Once | RECTAL | 0 refills | Status: AC

## 2019-08-03 NOTE — Discharge Instructions (Addendum)
It is very likely that your pain and symptoms are due to your serious constipation.    I prescribed you magnesium citrate to help you have a bowel movement.  Drink this and give yourself an enema at home to clean out your bowels.  As we discussed it is very important that you keep HYDRATED at home.  Try to drink gatorade, or soup broths, if you cannot keep down food.  Your sodium and potassium levels were a little low in the ER today, and your kidney function (creatinine level) was somewhat abnormal -- all of which are signs of dehydration and poor fluid intake.  Call your doctor and schedule an office visit in the next week - ask to have these levels rechecked.  If your pain is getting worse tomorrow, or you are still unable to keep down any fluids, or you begin having fevers at home, please come back to the Emergency Department.

## 2019-08-03 NOTE — ED Notes (Signed)
Patient verbalizes understanding of discharge instructions. Opportunity for questioning and answers were provided. Armband removed by staff, pt discharged from ED.  

## 2019-08-09 ENCOUNTER — Inpatient Hospital Stay: Payer: Medicare Other | Attending: Internal Medicine

## 2019-08-09 ENCOUNTER — Other Ambulatory Visit: Payer: Self-pay

## 2019-08-09 ENCOUNTER — Encounter: Payer: Self-pay | Admitting: Sports Medicine

## 2019-08-09 ENCOUNTER — Ambulatory Visit (INDEPENDENT_AMBULATORY_CARE_PROVIDER_SITE_OTHER): Payer: Medicare Other | Admitting: Sports Medicine

## 2019-08-09 DIAGNOSIS — M2041 Other hammer toe(s) (acquired), right foot: Secondary | ICD-10-CM | POA: Diagnosis not present

## 2019-08-09 DIAGNOSIS — E1142 Type 2 diabetes mellitus with diabetic polyneuropathy: Secondary | ICD-10-CM

## 2019-08-09 DIAGNOSIS — M2040 Other hammer toe(s) (acquired), unspecified foot: Secondary | ICD-10-CM

## 2019-08-09 DIAGNOSIS — M79675 Pain in left toe(s): Secondary | ICD-10-CM

## 2019-08-09 DIAGNOSIS — M2141 Flat foot [pes planus] (acquired), right foot: Secondary | ICD-10-CM | POA: Diagnosis not present

## 2019-08-09 DIAGNOSIS — M19079 Primary osteoarthritis, unspecified ankle and foot: Secondary | ICD-10-CM

## 2019-08-09 DIAGNOSIS — M2042 Other hammer toe(s) (acquired), left foot: Secondary | ICD-10-CM

## 2019-08-09 DIAGNOSIS — M79674 Pain in right toe(s): Secondary | ICD-10-CM | POA: Diagnosis not present

## 2019-08-09 DIAGNOSIS — E1140 Type 2 diabetes mellitus with diabetic neuropathy, unspecified: Secondary | ICD-10-CM

## 2019-08-09 DIAGNOSIS — IMO0002 Reserved for concepts with insufficient information to code with codable children: Secondary | ICD-10-CM

## 2019-08-09 DIAGNOSIS — M205X1 Other deformities of toe(s) (acquired), right foot: Secondary | ICD-10-CM

## 2019-08-09 DIAGNOSIS — M2142 Flat foot [pes planus] (acquired), left foot: Secondary | ICD-10-CM

## 2019-08-09 DIAGNOSIS — E1165 Type 2 diabetes mellitus with hyperglycemia: Secondary | ICD-10-CM

## 2019-08-09 DIAGNOSIS — B351 Tinea unguium: Secondary | ICD-10-CM

## 2019-08-09 DIAGNOSIS — M2021 Hallux rigidus, right foot: Secondary | ICD-10-CM

## 2019-08-09 DIAGNOSIS — M2022 Hallux rigidus, left foot: Secondary | ICD-10-CM

## 2019-08-09 MED ORDER — DICLOFENAC SODIUM 1 % TD GEL: 4.0000 g | Freq: Four times a day (QID) | TRANSDERMAL | 2 refills | Status: DC

## 2019-08-09 NOTE — Progress Notes (Signed)
Subjective: Alejandra Hernandez is a 63 y.o. female patient with history of diabetes who presents to office today complaining of long,mildly painful nails  while ambulating in shoes; unable to trim. Patient states that the glucose reading this morning was 176 this morning last A1c of 13.1.  Patient denies any changes with the medication but just wants a refill on her topical Voltaren gel feels like it helps with when she has any pain in her feet or legs.  Patient is also here for pickup of diabetic shoes. No other issues.   Patient Active Problem List   Diagnosis Date Noted  . Complaints of total body pain 07/10/2019  . Left upper extremity swelling 06/03/2019  . COPD, mild ([place]) 05/26/2019  . Leukocytosis 04/14/2019  . Avascular necrosis of humeral head, left ([place]) 04/14/2019  . Hypokalemia 04/14/2019  . Neuropathy 04/14/2019  . DKA (diabetic ketoacidoses) ([place]) 04/13/2019  . Polyp of transverse colon   . Atypical chest pain 11/16/2018  . Uncontrolled insulin dependent diabetes mellitus ([place]) 05/23/2018  . CKD (chronic kidney disease), stage III ([place]) 05/14/2018  . Hemoptysis 02/24/2018  . Hyperglycemia 12/23/2017  . Low TSH level 06/22/2017  . CAD (coronary artery disease)   . Nodule on liver 05/22/2017  . Heart palpitations 05/14/2017  . Unspecified skin changes 04/18/2017  . History of tobacco use 03/17/2017  . Benign neoplasm of descending colon   . Benign neoplasm of sigmoid colon   . Chronic diastolic CHF (congestive heart failure) ([place]) 02/09/2017  . History of DVT (deep vein thrombosis)   . Abnormal CT scan, chest   . Acute urinary retention   . Claudication ([place]) 07/23/2016  . PAD (peripheral artery disease) ([place])   . Essential hypertension 05/14/2016  . Uncontrolled type 2 diabetes mellitus with ketoacidosis without coma, with long-term current use of insulin ([place])   . GERD (gastroesophageal reflux disease) 05/17/2014  . Carotid artery disease ([place]) 05/17/2014  . Iron  deficiency anemia 04/26/2014  . Dyspnea 04/25/2014  . Carotid artery bruit 04/25/2014  . Hyperlipidemia with target low density lipoprotein (LDL) cholesterol less than 70 mg/dL   . Gout    Current Outpatient Medications on File Prior to Visit  Medication Sig Dispense Refill  . acetaminophen (TYLENOL) 325 MG tablet Take 325 mg by mouth 2 (two) times a day.     . albuterol (PROAIR HFA) 108 (90 Base) MCG/ACT inhaler Inhale 2 puffs into the lungs every 4 (four) hours as needed for wheezing or shortness of breath. 1 Inhaler 5  . amLODipine (NORVASC) 10 MG tablet Take 10 mg by mouth at bedtime.    Marland Kitchen aspirin 81 MG chewable tablet Chew 1 tablet (81 mg total) by mouth daily. 30 tablet 3  . Blood Glucose Monitoring Suppl (ACCU-CHEK AVIVA) device Use as instructed 1 each 0  . Calcium Carbonate Antacid (TUMS PO) Take 2 tablets by mouth 3 (three) times daily as needed (acid indigestion).     . cloNIDine (CATAPRES) 0.1 MG tablet Take 0.1 mg by mouth 2 (two) times daily.    Marland Kitchen ezetimibe (ZETIA) 10 MG tablet TAKE 1 TABLET(10 MG) BY MOUTH DAILY (Patient taking differently: Take 10 mg by mouth daily. ) 90 tablet 3  . ferrous sulfate 325 (65 FE) MG tablet Take 325 mg by mouth daily with breakfast.     . furosemide (LASIX) 20 MG tablet TAKE 1 TABLET(20 MG) BY MOUTH EVERY MORNING (Patient taking differently: Take 20 mg by mouth daily. ) 30 tablet  5  . gabapentin (NEURONTIN) 600 MG tablet Take 0.5 tablets (300 mg total) by mouth 3 (three) times daily. 90 tablet 2  . glucose blood (ACCU-CHEK AVIVA PLUS) test strip 1 each by Other route 2 (two) times daily. And lancets 2/day 200 each 3  . hydroxypropyl methylcellulose / hypromellose (ISOPTO TEARS / GONIOVISC) 2.5 % ophthalmic solution Place 1 drop into both eyes 3 (three) times daily as needed for dry eyes.    . Insulin Degludec (TRESIBA FLEXTOUCH) 200 UNIT/ML SOPN Inject 68 Units into the skin daily. And pen needles 1/day (Patient taking differently: Inject 68 Units  into the skin at bedtime. And pen needles 1/day) 9 pen 11  . insulin lispro (HUMALOG) 100 UNIT/ML injection Inject 1-10 Units into the skin 3 (three) times daily before meals. Per sliding scale    . isosorbide mononitrate (IMDUR) 30 MG 24 hr tablet Take 3 tablets (90 mg total) by mouth daily. 270 tablet 3  . lidocaine-prilocaine (EMLA) cream Apply 1 application topically See admin instructions. Apply 2-3 grams (1 gram = 1 inch) to back 4 times daily for pain    . nitroGLYCERIN (NITROSTAT) 0.4 MG SL tablet PLACE 1 TABLET UNDER THE TONGUE EVERY 5 MINUTES AS NEEDED FOR CHEST PAIN (Patient taking differently: Place 0.4 mg under the tongue every 5 (five) minutes as needed for chest pain. ) 25 tablet 3  . oxyCODONE-acetaminophen (PERCOCET) 10-325 MG tablet Take 1 tablet by mouth 4 (four) times daily as needed for pain.   0  . pantoprazole (PROTONIX) 40 MG tablet TAKE 1 TABLET BY MOUTH TWICE DAILY BEFORE A MEAL (Patient taking differently: Take 40 mg by mouth 2 (two) times daily. ) 180 tablet 0  . polyethylene glycol powder (MIRALAX) 17 GM/SCOOP powder Start taking 1 capful 3 times a day. Slowly cut back as needed until you have normal bowel movements. 255 g 0  . potassium chloride SA (K-DUR,KLOR-CON) 20 MEQ tablet Take 0.5 tablets (10 mEq total) by mouth daily. 90 tablet 1  . PRESCRIPTION MEDICATION APPLY 1 APPLICATION TOPICALLY  Cream compounded at [place] - B5, D3 G10, K10 Pent+ - apply 1 gram (1 gram = 1 pump) to affected area 4 times daily as needed for pain    . rivaroxaban (XARELTO) 20 MG TABS tablet Take 1 tablet (20 mg total) by mouth daily with supper. 30 tablet 11  . rosuvastatin (CRESTOR) 40 MG tablet Take 1 tablet (40 mg total) by mouth daily at 6 PM. 90 tablet 0  . Vitamin D, Ergocalciferol, (DRISDOL) 50000 units CAPS capsule Take 50,000 Units by mouth every Wednesday.   10   No current facility-administered medications on file prior to visit.    Allergies  Allergen Reactions  .  Lisinopril Hives      Objective: General: Patient is awake, alert, and oriented x 3 and in no acute distress.  Integument: Skin is warm, dry and supple bilateral. Nails are tender, long, thickened and  dystrophic with subungual debris, consistent with onychomycosis, 1-5 bilateral. No signs of infection. No open lesions or preulcerative lesions present bilateral. Remaining integument unremarkable.  Vasculature:  Dorsalis Pedis pulse 1/4 bilateral. Posterior Tibial pulse  1/4 bilateral.  Capillary fill time <3 sec 1-5 bilateral. Positive hair growth to the level of the digits. Temperature gradient within normal limits. No varicosities present bilateral. No edema present bilateral.   Neurology: The patient has diminished sensation measured with a 5.07/10g Semmes Weinstein Monofilament at all pedal sites bilateral. Vibratory sensation diminished  bilateral with tuning fork. No Babinski sign present bilateral.   Musculoskeletal: Asymptomatic hallux limitus, pes planus, hammertoe pedal deformities noted bilateral. Muscular strength 4/5 in all lower extremity muscular groups bilateral without pain on range of motion . No tenderness with calf compression bilateral.  Assessment and Plan: Problem List Items Addressed This Visit    None    Visit Diagnoses    Pain due to onychomycosis of toenails of both feet    -  Primary   Arthritis of foot       Relevant Medications   diclofenac sodium (VOLTAREN) 1 % GEL   Type 2 diabetes, uncontrolled, with neuropathy (HCC)       Hammer toe, unspecified laterality       Pes planus of both feet       Acquired hallux limitus of both feet           -Examined patient. -Discussed and educated patient on diabetic foot care and glycemic control, especially with  regards to the vascular, neurological and musculoskeletal systems.  -Mechanically debrided all nails 1-5 bilateral using sterile nail nipper and filed with dremel without incident  -Diabetic shoes  were dispensed patient to wear with break-in.  Ask explained patient able to ambulate 10 feet without pain or discomfort.  Charges will be entered by prosthetics department. -Refilled diclofenac cream  -Patient to return  in 3 months for at risk foot nail care -Patient advised to call the office if any problems or questions arise in the meantime.  Landis Martins, DPM

## 2019-08-10 ENCOUNTER — Telehealth: Payer: Self-pay | Admitting: Cardiology

## 2019-08-10 NOTE — Telephone Encounter (Signed)
Did she have a stomach flu?  Why was she vomiting

## 2019-08-10 NOTE — Telephone Encounter (Signed)
New message   Please call patient has a critical EKG.

## 2019-08-10 NOTE — Telephone Encounter (Addendum)
Spoke with Broadus John at [facility] [employer] and he reports the pt had NSR with a 4 sec pause at 6:46 am this morning with no other notifications.   Pt had televisit with Dr. Radford Pax 07/19/19 and she ordered a 30 day monitor for the pt for palpitations.   LM for the pt to call back. Will obtain monitor strip.

## 2019-08-10 NOTE — Telephone Encounter (Signed)
Spoke with pt and during the episode (4 second pause) pt states was throwing up, dizzy,lightheaded, and had chest pain.Discussed with Dr Angelena Form no changes at this time continue to monitor and review with Dr Radford Pax .Alejandra Hernandez Housekeeper Recordings placed in Dr Theodosia Blender mailbox for review .Alejandra Hernandez Housekeeper

## 2019-08-11 ENCOUNTER — Inpatient Hospital Stay: Payer: Medicare Other | Admitting: Hematology and Oncology

## 2019-08-11 NOTE — Telephone Encounter (Signed)
I spoke to the patient who believes that it is the stomach flu.  She cannot keep any food down.  She has an appointment on 9/18 with PCP.  She had stomach surgery in August and has not felt well since.

## 2019-08-11 NOTE — Assessment & Plan Note (Deleted)
03/05/2018: Hemoglobin 9.9, MCV 68.3, RDW 15.5, creatinine 1.42 06/21/2018: Hemoglobin 9.5, MCV 67.7, RDW 18.8, creatinine 1.31 07/28/2018: Hemoglobin 10, MCV 68, RDW 18.1, creatinine 1.43 10/05/2018: Hemoglobin 10.6, MCV 69.7, RDW 18.1, creatinine 1.47 12/24/2018: Hemoglobin 9.1, MCV 73, RDW 16.4, ALC 5335 with a WBC of 11 01/16/2019: Hemoglobin 9.2, MCV 71.2, RDW 16.3, WBC 6 02/01/2019: Hemoglobin 9.5, MCV 68.6, iron saturation 8%, ferritin 15  Prior treatment: IV iron: 01/01/2017, 06/02/2017, 02/02/2019 Lab review: 08/02/2019: Hemoglobin 10.4, MCV 70.7, platelets 418 Iron studies were not done. Based on the microcytosis I suspect that the patient has mild iron deficiency.

## 2019-08-11 NOTE — Telephone Encounter (Signed)
Likely 4 second pause related to vagal episode while vomiting.  Continue to monitor

## 2019-09-02 NOTE — Telephone Encounter (Signed)
Spoke with the patient regarding her recent GI illness. She states that she has been having issues for months. She went into the hospital in July for surgery on multiple blood clots in her arm and was not released until med August. Pt has has N/V symptoms ever since.

## 2019-09-05 ENCOUNTER — Other Ambulatory Visit: Payer: Self-pay

## 2019-09-05 ENCOUNTER — Ambulatory Visit: Payer: Medicare Other | Attending: Orthopedic Surgery | Admitting: Physical Therapy

## 2019-09-05 ENCOUNTER — Encounter: Payer: Self-pay | Admitting: Physical Therapy

## 2019-09-05 DIAGNOSIS — I6529 Occlusion and stenosis of unspecified carotid artery: Secondary | ICD-10-CM

## 2019-09-05 DIAGNOSIS — M25612 Stiffness of left shoulder, not elsewhere classified: Secondary | ICD-10-CM | POA: Insufficient documentation

## 2019-09-05 DIAGNOSIS — M6281 Muscle weakness (generalized): Secondary | ICD-10-CM | POA: Diagnosis present

## 2019-09-05 DIAGNOSIS — M25512 Pain in left shoulder: Secondary | ICD-10-CM | POA: Insufficient documentation

## 2019-09-05 DIAGNOSIS — M62838 Other muscle spasm: Secondary | ICD-10-CM

## 2019-09-05 NOTE — Patient Instructions (Signed)
Access Code: [email or web address]  URL: [email or web address]  Date: 09/05/2019  Prepared by: Jeral Pinch   Exercises  Supine Shoulder Protraction with Dowel - 10 reps - 3x daily  Supine Shoulder Flexion with Dowel - 10 reps - 3x daily  Supine Shoulder External Rotation with Dowel - 10 reps - 3x daily  Seated Scapular Retraction - 10 reps - 3x daily  Seated Cervical Retraction - 10 reps - 3x daily  Standing Single Arm Shoulder Shrug Circles AROM Backward - 10 reps - 3x daily    Massage all incisions around Lt shoulder.
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
## 2019-09-07 ENCOUNTER — Other Ambulatory Visit: Payer: Self-pay

## 2019-09-07 ENCOUNTER — Ambulatory Visit (INDEPENDENT_AMBULATORY_CARE_PROVIDER_SITE_OTHER): Payer: Medicare Other | Admitting: Vascular Surgery

## 2019-09-07 ENCOUNTER — Ambulatory Visit (HOSPITAL_COMMUNITY)
Admission: RE | Admit: 2019-09-07 | Discharge: 2019-09-07 | Disposition: A | Discharge status: Home / Self Care | Payer: Medicare Other | Source: Ambulatory Visit | Attending: Vascular Surgery | Admitting: Vascular Surgery

## 2019-09-07 ENCOUNTER — Encounter: Payer: Self-pay | Admitting: Vascular Surgery

## 2019-09-07 VITALS — BP 112/67 | HR 73 | Temp 97.4°F | Resp 20 | Ht 63.0 in | Wt 122.0 lb

## 2019-09-07 DIAGNOSIS — I6523 Occlusion and stenosis of bilateral carotid arteries: Secondary | ICD-10-CM

## 2019-09-07 DIAGNOSIS — I6529 Occlusion and stenosis of unspecified carotid artery: Secondary | ICD-10-CM | POA: Diagnosis not present

## 2019-09-07 NOTE — Progress Notes (Signed)
REASON FOR CONSULT:    Bilateral carotid disease.  The consult is requested by Dr. Sheryle Hail.   ASSESSMENT & PLAN:   BILATERAL CAROTID STENOSES: This patient has bilateral 40 to 59% carotid stenoses.  I explained that we would not consider carotid endarterectomy less the stenosis progressed to greater than 80% or she develop new neurologic symptoms.  She is on aspirin and is on a statin.  I have recommended a follow-up carotid duplex scan in 1 year and I will see her back at that time.  In the meantime of encouraged her to stay as active as possible.  We did discuss the importance of nutrition.  I encouraged to stay away from the cigarettes which she quit in 2019.  She knows to call sooner if she has problems.  [name], MD, FACS [facility] [phone] Office: [phone]   HPI:   Alejandra Hernandez is a pleasant 63 y.o. female, who was referred with bilateral carotid disease.  The patient is right-handed.  She denies any history of stroke, TIAs, expressive or receptive aphasia, or amaurosis fugax.  She tells me that she did have a study which showed she had a fairly tight carotid blockage however the only study I can find before our study today was a duplex on 01/17/2019 which showed bilateral 40 to 59% carotid stenoses.  Thus I am not sure where she got this information.  Regardless, on my history she is asymptomatic.  She is on aspirin and is on a statin.  She is also on Xarelto.  She does have a history of coronary artery disease and has had a previous coronary stent in 2018.  The patient does have a history of peripheral vascular disease which is followed by Dr. Fletcher Anon.  She is had multiple previous stents.  Past Medical History:  Diagnosis Date  . Anemia   . Anxiety   . Arthritis    "right knee, back, feet, hands" (10/20/2018)  . Bleeding stomach ulcer 01/2016  . CAD (coronary artery disease)    05/19/17 PCI with DESx1 to [facility]  . Carotid artery disease (HCC)    40-59% right and  1-39% left by doppler 05/2018 with stenotic right subclavian artery  . Chronic lower back pain   . CKD (chronic kidney disease) stage 3, GFR 30-59 ml/min 01/2016  . Depression   . Diabetic peripheral neuropathy ([place])   . DVT (deep venous thrombosis) ([place]) 12/2016   BLE  . GERD (gastroesophageal reflux disease)   . GI bleed   . Gout   . Headache    "weekly" (11/27//2019)  . History of blood transfusion 2017   "related to stomach bleeding"  . Hyperlipidemia   . Hypertension   . NSTEMI (non-ST elevated myocardial infarction) ([place]) 05/2017  . PAD (peripheral artery disease) ([place])    a. LE stenting in 2017, 12/2017, 09/2018 - Dr. Fletcher Anon  . Pneumonia    "several times" (10/20/2018)  . Renal artery stenosis (HCC)    a.  mild to moderate RAS by aortogram in 2017 (no evidence of this on duplex 06/2018).  . Stenosis of right subclavian artery ([place])   . Type II diabetes mellitus (HCC)     Family History  Problem Relation Age of Onset  . Diabetes Mother   . Heart disease Mother   . Kidney failure Mother   . Thyroid cancer Mother   . Liver cancer Sister   . Diabetes Sister   . Colon cancer Neg Hx   .  Stomach cancer Neg Hx     SOCIAL HISTORY: She quit smoking in 2019. Social History   Socioeconomic History  . Marital status: Single    Spouse name: Not on file  . Number of children: 5  . Years of education: Not on file  . Highest education level: Not on file  Occupational History  . Occupation: Disabled  Social Needs  . Financial resource strain: Not very hard  . Food insecurity    Worry: Sometimes true    Inability: Sometimes true  . Transportation needs    Medical: No    Non-medical: No  Tobacco Use  . Smoking status: Former Smoker    Packs/day: 1.00    Years: 43.00    Pack years: 43.00    Types: Cigarettes, E-cigarettes    Quit date: 11/24/2017    Years since quitting: 1.7  . Smokeless tobacco: Never Used  Substance and Sexual Activity  . Alcohol use: No     Alcohol/week: 0.0 standard drinks    Comment: 10/20/2018 "nothing since 2012"  . Drug use: Never  . Sexual activity: Not Currently  Lifestyle  . Physical activity    Days per week: 0 days    Minutes per session: 0 min  . Stress: Only a little  Relationships  . Social connections    Talks on phone: Once a week    Gets together: Three times a week    Attends religious service: 1 to 4 times per year    Active member of club or organization: Not on file    Attends meetings of clubs or organizations: 1 to 4 times per year    Relationship status: Divorced  . Intimate partner violence    Fear of current or ex partner: No    Emotionally abused: No    Physically abused: No    Forced sexual activity: No  Other Topics Concern  . Not on file  Social History Narrative  . Not on file    Allergies  Allergen Reactions  . Lisinopril Hives    Current Outpatient Medications  Medication Sig Dispense Refill  . acetaminophen (TYLENOL) 325 MG tablet Take 325 mg by mouth 2 (two) times a day.     . albuterol (PROAIR HFA) 108 (90 Base) MCG/ACT inhaler Inhale 2 puffs into the lungs every 4 (four) hours as needed for wheezing or shortness of breath. 1 Inhaler 5  . amitriptyline (ELAVIL) 25 MG tablet TK 1 T PO HS FOR 7 DAYS THEN 2 PO HS FOR 7 DAYS THEN 3 PO HS    . amLODipine (NORVASC) 10 MG tablet Take 10 mg by mouth at bedtime.    Marland Kitchen aspirin 81 MG chewable tablet Chew 1 tablet (81 mg total) by mouth daily. 30 tablet 3  . Blood Glucose Monitoring Suppl (ACCU-CHEK AVIVA) device Use as instructed 1 each 0  . Calcium Carbonate Antacid (TUMS PO) Take 2 tablets by mouth 3 (three) times daily as needed (acid indigestion).     . cloNIDine (CATAPRES) 0.1 MG tablet Take 0.1 mg by mouth 2 (two) times daily.    . diclofenac sodium (VOLTAREN) 1 % GEL Apply 4 g topically 4 (four) times daily. 150 g 2  . ezetimibe (ZETIA) 10 MG tablet TAKE 1 TABLET(10 MG) BY MOUTH DAILY (Patient taking differently: Take 10 mg by  mouth daily. ) 90 tablet 3  . ferrous sulfate 325 (65 FE) MG tablet Take 325 mg by mouth daily with breakfast.     .  furosemide (LASIX) 20 MG tablet TAKE 1 TABLET(20 MG) BY MOUTH EVERY MORNING (Patient taking differently: Take 20 mg by mouth daily. ) 30 tablet 5  . gabapentin (NEURONTIN) 600 MG tablet Take 0.5 tablets (300 mg total) by mouth 3 (three) times daily. 90 tablet 2  . glucose blood (ACCU-CHEK AVIVA PLUS) test strip 1 each by Other route 2 (two) times daily. And lancets 2/day 200 each 3  . hydroxypropyl methylcellulose / hypromellose (ISOPTO TEARS / GONIOVISC) 2.5 % ophthalmic solution Place 1 drop into both eyes 3 (three) times daily as needed for dry eyes.    . Insulin Degludec (TRESIBA FLEXTOUCH) 200 UNIT/ML SOPN Inject 68 Units into the skin daily. And pen needles 1/day (Patient taking differently: Inject 68 Units into the skin at bedtime. And pen needles 1/day) 9 pen 11  . insulin lispro (HUMALOG) 100 UNIT/ML injection Inject 1-10 Units into the skin 3 (three) times daily before meals. Per sliding scale    . isosorbide mononitrate (IMDUR) 30 MG 24 hr tablet Take 3 tablets (90 mg total) by mouth daily. 270 tablet 3  . lidocaine-prilocaine (EMLA) cream Apply 1 application topically See admin instructions. Apply 2-3 grams (1 gram = 1 inch) to back 4 times daily for pain    . nitroGLYCERIN (NITROSTAT) 0.4 MG SL tablet PLACE 1 TABLET UNDER THE TONGUE EVERY 5 MINUTES AS NEEDED FOR CHEST PAIN (Patient taking differently: Place 0.4 mg under the tongue every 5 (five) minutes as needed for chest pain. ) 25 tablet 3  . oxyCODONE-acetaminophen (PERCOCET) 10-325 MG tablet Take 1 tablet by mouth 4 (four) times daily as needed for pain.   0  . pantoprazole (PROTONIX) 40 MG tablet TAKE 1 TABLET BY MOUTH TWICE DAILY BEFORE A MEAL (Patient taking differently: Take 40 mg by mouth 2 (two) times daily. ) 180 tablet 0  . polyethylene glycol powder (MIRALAX) 17 GM/SCOOP powder Start taking 1 capful 3 times a  day. Slowly cut back as needed until you have normal bowel movements. 255 g 0  . potassium chloride SA (K-DUR,KLOR-CON) 20 MEQ tablet Take 0.5 tablets (10 mEq total) by mouth daily. 90 tablet 1  . PRESCRIPTION MEDICATION APPLY 1 APPLICATION TOPICALLY  Cream compounded at [place] - B5, D3 G10, K10 Pent+ - apply 1 gram (1 gram = 1 pump) to affected area 4 times daily as needed for pain    . rivaroxaban (XARELTO) 20 MG TABS tablet Take 1 tablet (20 mg total) by mouth daily with supper. 30 tablet 11  . rosuvastatin (CRESTOR) 40 MG tablet Take 1 tablet (40 mg total) by mouth daily at 6 PM. 90 tablet 0  . Vitamin D, Ergocalciferol, (DRISDOL) 50000 units CAPS capsule Take 50,000 Units by mouth every Wednesday.   10   No current facility-administered medications for this visit.     REVIEW OF SYSTEMS:  [X]  denotes positive finding, [ ]  denotes negative finding Cardiac  Comments:  Chest pain or chest pressure: x   Shortness of breath upon exertion: x   Short of breath when lying flat:    Irregular heart rhythm: x       Vascular    Pain in calf, thigh, or hip brought on by ambulation: x   Pain in feet at night that wakes you up from your sleep:  x   Blood clot in your veins: x   Leg swelling:         Pulmonary    Oxygen at home:  Productive cough:     Wheezing:         Neurologic    Sudden weakness in arms or legs:  x   Sudden numbness in arms or legs:     Sudden onset of difficulty speaking or slurred speech:    Temporary loss of vision in one eye:     Problems with dizziness:  x       Gastrointestinal    Blood in stool:     Vomited blood:         Genitourinary    Burning when urinating:     Blood in urine:        Psychiatric    Major depression:         Hematologic    Bleeding problems:    Problems with blood clotting too easily:        Skin    Rashes or ulcers: x       Constitutional    Fever or chills:     PHYSICAL EXAM:   Vitals:   09/07/19 1334  BP:  112/67  Pulse: 73  Resp: 20  Temp: (!) 97.4 F (36.3 C)  SpO2: 99%  Weight: 122 lb (55.3 kg)  Height: 5\' 3"  (1.6 m)    GENERAL: The patient is a well-nourished female, in no acute distress. The vital signs are documented above. CARDIAC: There is a regular rate and rhythm.  VASCULAR: She does have a left carotid bruit. She has palpable femoral pulses. She has weakly palpable dorsalis pedis pulses bilaterally. She has no significant lower extremity swelling. PULMONARY: There is good air exchange bilaterally without wheezing or rales. ABDOMEN: Soft and non-tender with normal pitched bowel sounds.  MUSCULOSKELETAL: There are no major deformities or cyanosis. NEUROLOGIC: No focal weakness or paresthesias are detected. SKIN: There are no ulcers or rashes noted. PSYCHIATRIC: The patient has a normal affect.  DATA:    CAROTID DUPLEX: I have independently interpreted her carotid duplex scan.  On the right side there is a 40 to 59% proximal internal carotid artery stenosis.  The right vertebral artery is patent with antegrade flow.  On the left side there is a 40 to 59% proximal ICA stenosis.  The left vertebral artery is patent with antegrade flow.

## 2019-09-08 ENCOUNTER — Other Ambulatory Visit: Payer: Self-pay | Admitting: Cardiology

## 2019-09-08 MED ORDER — EZETIMIBE 10 MG PO TABS: ORAL_TABLET | ORAL | 2 refills | Status: DC

## 2019-09-14 ENCOUNTER — Other Ambulatory Visit: Payer: Self-pay | Admitting: Internal Medicine

## 2019-09-14 DIAGNOSIS — Z1231 Encounter for screening mammogram for malignant neoplasm of breast: Secondary | ICD-10-CM

## 2019-09-20 ENCOUNTER — Encounter: Payer: Self-pay | Admitting: Physical Therapy

## 2019-09-20 ENCOUNTER — Other Ambulatory Visit: Payer: Self-pay

## 2019-09-20 ENCOUNTER — Ambulatory Visit: Payer: Medicare Other | Admitting: Physical Therapy

## 2019-09-20 DIAGNOSIS — M62838 Other muscle spasm: Secondary | ICD-10-CM

## 2019-09-20 DIAGNOSIS — M25512 Pain in left shoulder: Secondary | ICD-10-CM

## 2019-09-20 DIAGNOSIS — M25612 Stiffness of left shoulder, not elsewhere classified: Secondary | ICD-10-CM

## 2019-09-20 DIAGNOSIS — M6281 Muscle weakness (generalized): Secondary | ICD-10-CM

## 2019-09-20 NOTE — Patient Instructions (Signed)
Posture Tips DO: - stand tall and erect - keep chin tucked in - keep head and shoulders in alignment - check posture regularly in mirror or large window - pull head back against headrest in car seat;  Change your position often.  Sit with lumbar support. DON'T: - slouch or slump while watching TV or reading - sit, stand or lie in one position  for too long;  Sitting is especially hard on the spine so if you sit at a desk/use the computer, then stand up often!   Copyright  VHI. All rights reserved.  Posture - Standing   Good posture is important. Avoid slouching and forward head thrust. Maintain curve in low back and align ears over shoul- ders, hips over ankles.  Pull your belly button in toward your back bone. Ribs lifted up and chin down, even wt in your toes and heels.  Breath deeply 5 x every 3 hours.   Copyright  VHI. All rights reserved.  Posture - Sitting   Sit upright, head facing forward. Try using a roll to support lower back. Keep shoulders relaxed, and avoid rounded back. Keep hips level with knees. Avoid crossing legs for long periods. Sit on your sit bones NOT your tail bone    Dont look like a big C      Alejandra Hernandez, PT Certified Exercise Expert for the Aging Adult  09/20/19 2:15 PM Phone: [phone] Fax: [phone]
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
## 2019-09-22 ENCOUNTER — Ambulatory Visit: Payer: Medicare Other | Admitting: Physical Therapy

## 2019-09-26 ENCOUNTER — Ambulatory Visit: Payer: Medicare Other | Attending: Orthopedic Surgery | Admitting: Physical Therapy

## 2019-09-26 ENCOUNTER — Other Ambulatory Visit: Payer: Self-pay

## 2019-09-26 DIAGNOSIS — M25512 Pain in left shoulder: Secondary | ICD-10-CM

## 2019-09-26 DIAGNOSIS — M62838 Other muscle spasm: Secondary | ICD-10-CM | POA: Insufficient documentation

## 2019-09-26 DIAGNOSIS — M6281 Muscle weakness (generalized): Secondary | ICD-10-CM | POA: Diagnosis present

## 2019-09-26 DIAGNOSIS — M25612 Stiffness of left shoulder, not elsewhere classified: Secondary | ICD-10-CM | POA: Diagnosis present
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
## 2019-09-28 ENCOUNTER — Ambulatory Visit: Payer: Medicare Other | Admitting: Physical Therapy

## 2019-09-28 ENCOUNTER — Encounter: Payer: Self-pay | Admitting: Physical Therapy

## 2019-09-28 ENCOUNTER — Other Ambulatory Visit: Payer: Self-pay

## 2019-09-28 DIAGNOSIS — M6281 Muscle weakness (generalized): Secondary | ICD-10-CM

## 2019-09-28 DIAGNOSIS — M25612 Stiffness of left shoulder, not elsewhere classified: Secondary | ICD-10-CM

## 2019-09-28 DIAGNOSIS — M25512 Pain in left shoulder: Secondary | ICD-10-CM

## 2019-09-28 DIAGNOSIS — M62838 Other muscle spasm: Secondary | ICD-10-CM
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
## 2019-10-03 ENCOUNTER — Other Ambulatory Visit: Payer: Self-pay

## 2019-10-03 ENCOUNTER — Ambulatory Visit: Payer: Medicare Other | Admitting: Physical Therapy

## 2019-10-03 ENCOUNTER — Encounter: Payer: Self-pay | Admitting: Physical Therapy

## 2019-10-03 DIAGNOSIS — M62838 Other muscle spasm: Secondary | ICD-10-CM

## 2019-10-03 DIAGNOSIS — M25512 Pain in left shoulder: Secondary | ICD-10-CM

## 2019-10-03 DIAGNOSIS — M25612 Stiffness of left shoulder, not elsewhere classified: Secondary | ICD-10-CM

## 2019-10-03 DIAGNOSIS — M6281 Muscle weakness (generalized): Secondary | ICD-10-CM
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
## 2019-10-05 ENCOUNTER — Ambulatory Visit: Payer: Medicare Other | Admitting: Physical Therapy

## 2019-10-10 ENCOUNTER — Encounter: Payer: Self-pay | Admitting: Physical Therapy

## 2019-10-10 ENCOUNTER — Other Ambulatory Visit: Payer: Self-pay

## 2019-10-10 ENCOUNTER — Ambulatory Visit: Payer: Medicare Other | Admitting: Physical Therapy

## 2019-10-10 VITALS — BP 110/60 | HR 85

## 2019-10-10 DIAGNOSIS — M6281 Muscle weakness (generalized): Secondary | ICD-10-CM

## 2019-10-10 DIAGNOSIS — M62838 Other muscle spasm: Secondary | ICD-10-CM

## 2019-10-10 DIAGNOSIS — M25512 Pain in left shoulder: Secondary | ICD-10-CM | POA: Diagnosis not present

## 2019-10-10 DIAGNOSIS — M25612 Stiffness of left shoulder, not elsewhere classified: Secondary | ICD-10-CM
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
## 2019-10-12 ENCOUNTER — Other Ambulatory Visit: Payer: Self-pay

## 2019-10-12 ENCOUNTER — Encounter: Payer: Self-pay | Admitting: Physical Therapy

## 2019-10-12 ENCOUNTER — Ambulatory Visit: Payer: Medicare Other | Admitting: Physical Therapy

## 2019-10-12 ENCOUNTER — Telehealth: Payer: Self-pay | Admitting: Cardiology

## 2019-10-12 VITALS — BP 124/65 | HR 87

## 2019-10-12 DIAGNOSIS — M25512 Pain in left shoulder: Secondary | ICD-10-CM

## 2019-10-12 DIAGNOSIS — M25612 Stiffness of left shoulder, not elsewhere classified: Secondary | ICD-10-CM

## 2019-10-12 DIAGNOSIS — M6281 Muscle weakness (generalized): Secondary | ICD-10-CM

## 2019-10-12 DIAGNOSIS — M62838 Other muscle spasm: Secondary | ICD-10-CM

## 2019-10-12 NOTE — Telephone Encounter (Signed)
New Message:  The patient reports a tight feeling in her chest, not so much a pain but a tight sensation. She states the feeling comes and goes. She wonders if it may be anxiety.   Pt c/o of Chest Pain: STAT if CP now or developed within 24 hours  1. Are you having CP right now? No  2. Are you experiencing any other symptoms (ex. SOB, nausea, vomiting, sweating)? Dizziness, vomiting in the morning, occasional sweating  3. How long have you been experiencing CP? Since before wearing the monitor  4. Is your CP continuous or coming and going? Comes and goes   5. Have you taken Nitroglycerin? One day over the weekend   She is scheduled for another doctors appointment later today at 3:00. If the office calls at that time she will not be able to answer. Please leave a detailed message

## 2019-10-12 NOTE — Telephone Encounter (Signed)
[id number] 11/18
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
## 2019-10-14 NOTE — Telephone Encounter (Signed)
[id number] 11/20

## 2019-10-17 ENCOUNTER — Other Ambulatory Visit: Payer: Self-pay

## 2019-10-17 ENCOUNTER — Ambulatory Visit: Payer: Medicare Other | Admitting: Physical Therapy

## 2019-10-17 VITALS — BP 123/63 | HR 81

## 2019-10-17 DIAGNOSIS — M25512 Pain in left shoulder: Secondary | ICD-10-CM

## 2019-10-17 DIAGNOSIS — M62838 Other muscle spasm: Secondary | ICD-10-CM

## 2019-10-17 DIAGNOSIS — M25612 Stiffness of left shoulder, not elsewhere classified: Secondary | ICD-10-CM

## 2019-10-17 DIAGNOSIS — M6281 Muscle weakness (generalized): Secondary | ICD-10-CM
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
## 2019-10-26 ENCOUNTER — Encounter: Payer: Self-pay | Admitting: Physical Therapy

## 2019-10-26 ENCOUNTER — Ambulatory Visit: Payer: Medicare Other | Attending: Orthopedic Surgery | Admitting: Physical Therapy

## 2019-10-26 ENCOUNTER — Other Ambulatory Visit: Payer: Self-pay

## 2019-10-26 VITALS — BP 139/67 | HR 87

## 2019-10-26 DIAGNOSIS — M25612 Stiffness of left shoulder, not elsewhere classified: Secondary | ICD-10-CM | POA: Insufficient documentation

## 2019-10-26 DIAGNOSIS — M62838 Other muscle spasm: Secondary | ICD-10-CM | POA: Diagnosis present

## 2019-10-26 DIAGNOSIS — M6281 Muscle weakness (generalized): Secondary | ICD-10-CM | POA: Insufficient documentation

## 2019-10-26 DIAGNOSIS — M25512 Pain in left shoulder: Secondary | ICD-10-CM | POA: Insufficient documentation
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
## 2019-10-28 ENCOUNTER — Encounter: Payer: Self-pay | Admitting: Physical Therapy

## 2019-10-28 ENCOUNTER — Other Ambulatory Visit: Payer: Self-pay

## 2019-10-28 ENCOUNTER — Ambulatory Visit: Payer: Medicare Other | Admitting: Physical Therapy

## 2019-10-28 VITALS — BP 145/69 | HR 92

## 2019-10-28 DIAGNOSIS — M6281 Muscle weakness (generalized): Secondary | ICD-10-CM

## 2019-10-28 DIAGNOSIS — M25512 Pain in left shoulder: Secondary | ICD-10-CM

## 2019-10-28 DIAGNOSIS — M62838 Other muscle spasm: Secondary | ICD-10-CM

## 2019-10-28 DIAGNOSIS — M25612 Stiffness of left shoulder, not elsewhere classified: Secondary | ICD-10-CM
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
## 2019-10-31 ENCOUNTER — Ambulatory Visit: Payer: Medicare Other | Admitting: Physical Therapy

## 2019-11-02 ENCOUNTER — Ambulatory Visit
Admission: RE | Admit: 2019-11-02 | Discharge: 2019-11-02 | Disposition: A | Discharge status: Home / Self Care | Payer: Medicare Other | Source: Ambulatory Visit | Attending: Internal Medicine | Admitting: Internal Medicine

## 2019-11-02 ENCOUNTER — Other Ambulatory Visit: Payer: Self-pay

## 2019-11-02 DIAGNOSIS — Z1231 Encounter for screening mammogram for malignant neoplasm of breast: Secondary | ICD-10-CM

## 2019-11-03 ENCOUNTER — Ambulatory Visit: Payer: Medicare Other | Admitting: Physical Therapy

## 2019-11-03 ENCOUNTER — Encounter: Payer: Self-pay | Admitting: Physical Therapy

## 2019-11-03 VITALS — BP 137/69 | HR 88

## 2019-11-03 DIAGNOSIS — M25612 Stiffness of left shoulder, not elsewhere classified: Secondary | ICD-10-CM

## 2019-11-03 DIAGNOSIS — M62838 Other muscle spasm: Secondary | ICD-10-CM

## 2019-11-03 DIAGNOSIS — M25512 Pain in left shoulder: Secondary | ICD-10-CM | POA: Diagnosis not present

## 2019-11-03 DIAGNOSIS — M6281 Muscle weakness (generalized): Secondary | ICD-10-CM
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
## 2019-11-07 ENCOUNTER — Encounter: Payer: Self-pay | Admitting: Physical Therapy

## 2019-11-07 ENCOUNTER — Ambulatory Visit: Payer: Medicare Other | Admitting: Physical Therapy

## 2019-11-07 ENCOUNTER — Other Ambulatory Visit: Payer: Self-pay

## 2019-11-07 VITALS — BP 137/59 | HR 84

## 2019-11-07 DIAGNOSIS — M25512 Pain in left shoulder: Secondary | ICD-10-CM

## 2019-11-07 DIAGNOSIS — M62838 Other muscle spasm: Secondary | ICD-10-CM

## 2019-11-07 DIAGNOSIS — M25612 Stiffness of left shoulder, not elsewhere classified: Secondary | ICD-10-CM

## 2019-11-07 DIAGNOSIS — M6281 Muscle weakness (generalized): Secondary | ICD-10-CM
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
## 2019-11-08 ENCOUNTER — Ambulatory Visit (INDEPENDENT_AMBULATORY_CARE_PROVIDER_SITE_OTHER): Payer: Medicare Other | Admitting: Sports Medicine

## 2019-11-08 ENCOUNTER — Encounter: Payer: Self-pay | Admitting: Sports Medicine

## 2019-11-08 DIAGNOSIS — M19079 Primary osteoarthritis, unspecified ankle and foot: Secondary | ICD-10-CM | POA: Diagnosis not present

## 2019-11-08 DIAGNOSIS — B351 Tinea unguium: Secondary | ICD-10-CM

## 2019-11-08 DIAGNOSIS — E1165 Type 2 diabetes mellitus with hyperglycemia: Secondary | ICD-10-CM

## 2019-11-08 DIAGNOSIS — E1140 Type 2 diabetes mellitus with diabetic neuropathy, unspecified: Secondary | ICD-10-CM | POA: Diagnosis not present

## 2019-11-08 DIAGNOSIS — IMO0002 Reserved for concepts with insufficient information to code with codable children: Secondary | ICD-10-CM

## 2019-11-08 DIAGNOSIS — M79675 Pain in left toe(s): Secondary | ICD-10-CM | POA: Diagnosis not present

## 2019-11-08 DIAGNOSIS — M79674 Pain in right toe(s): Secondary | ICD-10-CM

## 2019-11-08 NOTE — Progress Notes (Signed)
Subjective: Alejandra Hernandez is a 63 y.o. female patient with history of diabetes who presents to office today complaining of long,mildly painful nails  while ambulating in shoes; unable to trim. Patient states that the glucose reading this morning was 168 this morning last A1c of 13.1.  No other issues.   Patient Active Problem List   Diagnosis Date Noted  . Complaints of total body pain 07/10/2019  . Left upper extremity swelling 06/03/2019  . COPD, mild ([place]) 05/26/2019  . Leukocytosis 04/14/2019  . Avascular necrosis of humeral head, left ([place]) 04/14/2019  . Hypokalemia 04/14/2019  . Neuropathy 04/14/2019  . DKA (diabetic ketoacidoses) ([place]) 04/13/2019  . Polyp of transverse colon   . Atypical chest pain 11/16/2018  . Uncontrolled insulin dependent diabetes mellitus 05/23/2018  . CKD (chronic kidney disease), stage III 05/14/2018  . Hemoptysis 02/24/2018  . Hyperglycemia 12/23/2017  . Low TSH level 06/22/2017  . CAD (coronary artery disease)   . Nodule on liver 05/22/2017  . Heart palpitations 05/14/2017  . Unspecified skin changes 04/18/2017  . History of tobacco use 03/17/2017  . Benign neoplasm of descending colon   . Benign neoplasm of sigmoid colon   . Chronic diastolic CHF (congestive heart failure) ([place]) 02/09/2017  . History of DVT (deep vein thrombosis)   . Abnormal CT scan, chest   . Acute urinary retention   . Claudication ([place]) 07/23/2016  . PAD (peripheral artery disease) ([place])   . Essential hypertension 05/14/2016  . Uncontrolled type 2 diabetes mellitus with ketoacidosis without coma, with long-term current use of insulin ([place])   . GERD (gastroesophageal reflux disease) 05/17/2014  . Carotid artery disease ([place]) 05/17/2014  . Iron deficiency anemia 04/26/2014  . Dyspnea 04/25/2014  . Carotid artery bruit 04/25/2014  . Hyperlipidemia with target low density lipoprotein (LDL) cholesterol less than 70 mg/dL   . Gout    Current Outpatient Medications on  File Prior to Visit  Medication Sig Dispense Refill  . acetaminophen (TYLENOL) 325 MG tablet Take 325 mg by mouth 2 (two) times a day.     . albuterol (PROAIR HFA) 108 (90 Base) MCG/ACT inhaler Inhale 2 puffs into the lungs every 4 (four) hours as needed for wheezing or shortness of breath. 1 Inhaler 5  . amitriptyline (ELAVIL) 25 MG tablet TK 1 T PO HS FOR 7 DAYS THEN 2 PO HS FOR 7 DAYS THEN 3 PO HS    . amLODipine (NORVASC) 10 MG tablet Take 10 mg by mouth at bedtime.    Marland Kitchen aspirin 81 MG chewable tablet Chew 1 tablet (81 mg total) by mouth daily. 30 tablet 3  . Calcium Carbonate Antacid (TUMS PO) Take 2 tablets by mouth 3 (three) times daily as needed (acid indigestion).     . cloNIDine (CATAPRES) 0.1 MG tablet Take 0.1 mg by mouth 2 (two) times daily.    . diclofenac sodium (VOLTAREN) 1 % GEL Apply 4 g topically 4 (four) times daily. 150 g 2  . ezetimibe (ZETIA) 10 MG tablet TAKE 1 TABLET(10 MG) BY MOUTH DAILY 90 tablet 2  . ferrous sulfate 325 (65 FE) MG tablet Take 325 mg by mouth daily with breakfast.     . furosemide (LASIX) 20 MG tablet TAKE 1 TABLET(20 MG) BY MOUTH EVERY MORNING (Patient taking differently: Take 20 mg by mouth daily. ) 30 tablet 5  . gabapentin (NEURONTIN) 600 MG tablet Take 0.5 tablets (300 mg total) by mouth 3 (three) times daily. 90 tablet  2  . glucose blood (ACCU-CHEK AVIVA PLUS) test strip 1 each by Other route 2 (two) times daily. And lancets 2/day 200 each 3  . hydroxypropyl methylcellulose / hypromellose (ISOPTO TEARS / GONIOVISC) 2.5 % ophthalmic solution Place 1 drop into both eyes 3 (three) times daily as needed for dry eyes.    . Insulin Degludec (TRESIBA FLEXTOUCH) 200 UNIT/ML SOPN Inject 68 Units into the skin daily. And pen needles 1/day (Patient taking differently: Inject 68 Units into the skin at bedtime. And pen needles 1/day) 9 pen 11  . insulin lispro (HUMALOG) 100 UNIT/ML injection Inject 1-10 Units into the skin 3 (three) times daily before meals. Per  sliding scale    . isosorbide mononitrate (IMDUR) 30 MG 24 hr tablet Take 3 tablets (90 mg total) by mouth daily. 270 tablet 3  . lidocaine-prilocaine (EMLA) cream Apply 1 application topically See admin instructions. Apply 2-3 grams (1 gram = 1 inch) to back 4 times daily for pain    . nitroGLYCERIN (NITROSTAT) 0.4 MG SL tablet PLACE 1 TABLET UNDER THE TONGUE EVERY 5 MINUTES AS NEEDED FOR CHEST PAIN (Patient taking differently: Place 0.4 mg under the tongue every 5 (five) minutes as needed for chest pain. ) 25 tablet 3  . oxyCODONE-acetaminophen (PERCOCET) 10-325 MG tablet Take 1 tablet by mouth 4 (four) times daily as needed for pain.   0  . pantoprazole (PROTONIX) 40 MG tablet TAKE 1 TABLET BY MOUTH TWICE DAILY BEFORE A MEAL (Patient taking differently: Take 40 mg by mouth 2 (two) times daily. ) 180 tablet 0  . polyethylene glycol powder (MIRALAX) 17 GM/SCOOP powder Start taking 1 capful 3 times a day. Slowly cut back as needed until you have normal bowel movements. 255 g 0  . potassium chloride SA (K-DUR,KLOR-CON) 20 MEQ tablet Take 0.5 tablets (10 mEq total) by mouth daily. 90 tablet 1  . PRESCRIPTION MEDICATION APPLY 1 APPLICATION TOPICALLY  Cream compounded at [place] - B5, D3 G10, K10 Pent+ - apply 1 gram (1 gram = 1 pump) to affected area 4 times daily as needed for pain    . rivaroxaban (XARELTO) 20 MG TABS tablet Take 1 tablet (20 mg total) by mouth daily with supper. 30 tablet 11  . Vitamin D, Ergocalciferol, (DRISDOL) 50000 units CAPS capsule Take 50,000 Units by mouth every Wednesday.   10  . rosuvastatin (CRESTOR) 40 MG tablet Take 1 tablet (40 mg total) by mouth daily at 6 PM. 90 tablet 0   No current facility-administered medications on file prior to visit.   Allergies  Allergen Reactions  . Lisinopril Hives      Objective: General: Patient is awake, alert, and oriented x 3 and in no acute distress.  Integument: Skin is warm, dry and supple bilateral. Nails are  tender, long, thickened and  dystrophic with subungual debris, consistent with onychomycosis, 1-5 bilateral. No signs of infection. No open lesions or preulcerative lesions present bilateral. Remaining integument unremarkable.  Vasculature:  Dorsalis Pedis pulse 1/4 bilateral. Posterior Tibial pulse  1/4 bilateral.  Capillary fill time <3 sec 1-5 bilateral. Positive hair growth to the level of the digits. Temperature gradient within normal limits. No varicosities present bilateral. No edema present bilateral.   Neurology: The patient has diminished sensation measured with a 5.07/10g [place] Monofilament at all pedal sites bilateral. Vibratory sensation diminished bilateral with tuning fork. No Babinski sign present bilateral.   Musculoskeletal: Asymptomatic hallux limitus, pes planus, hammertoe pedal deformities noted bilateral. Muscular  strength 4/5 in all lower extremity muscular groups bilateral without pain on range of motion . No tenderness with calf compression bilateral.  Assessment and Plan: Problem List Items Addressed This Visit    None    Visit Diagnoses    Pain due to onychomycosis of toenails of both feet    -  Primary   Arthritis of foot       Type 2 diabetes, uncontrolled, with neuropathy ([place])         -Examined patient. -Re-Discussed and educated patient on diabetic foot care and glycemic control, especially with regards to the vascular, neurological and musculoskeletal systems.  -Mechanically debrided all nails 1-5 bilateral using sterile nail nipper and filed with dremel without incident  -Patient to return  in 3 months for at risk foot nail care -Patient advised to call the office if any problems or questions arise in the meantime.  Landis Martins, DPM

## 2019-11-09 ENCOUNTER — Encounter: Payer: Medicare Other | Admitting: Physical Therapy

## 2019-11-14 ENCOUNTER — Ambulatory Visit: Payer: Medicare Other | Admitting: Physical Therapy

## 2019-11-14 ENCOUNTER — Encounter: Payer: Self-pay | Admitting: Physical Therapy

## 2019-11-14 ENCOUNTER — Other Ambulatory Visit: Payer: Self-pay

## 2019-11-14 DIAGNOSIS — M25612 Stiffness of left shoulder, not elsewhere classified: Secondary | ICD-10-CM

## 2019-11-14 DIAGNOSIS — M62838 Other muscle spasm: Secondary | ICD-10-CM

## 2019-11-14 DIAGNOSIS — M25512 Pain in left shoulder: Secondary | ICD-10-CM

## 2019-11-14 DIAGNOSIS — M6281 Muscle weakness (generalized): Secondary | ICD-10-CM
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
## 2019-11-16 ENCOUNTER — Other Ambulatory Visit: Payer: Self-pay

## 2019-11-16 ENCOUNTER — Ambulatory Visit: Payer: Medicare Other | Admitting: Physical Therapy

## 2019-11-16 ENCOUNTER — Encounter: Payer: Self-pay | Admitting: Physical Therapy

## 2019-11-16 DIAGNOSIS — M25512 Pain in left shoulder: Secondary | ICD-10-CM | POA: Diagnosis not present

## 2019-11-16 DIAGNOSIS — M6281 Muscle weakness (generalized): Secondary | ICD-10-CM

## 2019-11-16 DIAGNOSIS — M25612 Stiffness of left shoulder, not elsewhere classified: Secondary | ICD-10-CM

## 2019-11-16 DIAGNOSIS — M62838 Other muscle spasm: Secondary | ICD-10-CM
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
## 2019-11-21 ENCOUNTER — Ambulatory Visit: Payer: Medicare Other | Admitting: Physical Therapy

## 2019-11-23 ENCOUNTER — Ambulatory Visit: Payer: Medicare Other | Admitting: Physical Therapy
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
## 2019-12-15 ENCOUNTER — Ambulatory Visit: Payer: Medicare Other | Admitting: General Practice
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
## 2019-12-16 ENCOUNTER — Ambulatory Visit (INDEPENDENT_AMBULATORY_CARE_PROVIDER_SITE_OTHER): Payer: Medicare Other | Admitting: General Practice

## 2019-12-16 ENCOUNTER — Encounter: Payer: Self-pay | Admitting: General Practice

## 2019-12-16 ENCOUNTER — Other Ambulatory Visit: Payer: Self-pay

## 2019-12-16 VITALS — BP 132/60 | HR 79 | Temp 97.3°F | Ht 63.0 in | Wt 131.4 lb

## 2019-12-16 DIAGNOSIS — I10 Essential (primary) hypertension: Secondary | ICD-10-CM

## 2019-12-16 DIAGNOSIS — E785 Hyperlipidemia, unspecified: Secondary | ICD-10-CM

## 2019-12-16 DIAGNOSIS — I739 Peripheral vascular disease, unspecified: Secondary | ICD-10-CM | POA: Diagnosis not present

## 2019-12-16 DIAGNOSIS — I25110 Atherosclerotic heart disease of native coronary artery with unstable angina pectoris: Secondary | ICD-10-CM

## 2019-12-16 MED ORDER — ISOSORBIDE MONONITRATE ER 30 MG PO TB24: 90.0000 mg | ORAL_TABLET | Freq: Every day | ORAL | 0 refills | Status: DC

## 2019-12-16 NOTE — Patient Instructions (Signed)
Follow-Up: IN on 01-17-2020  In Person Alejandra Sacramento, MD.    Reduce your risk of getting COVID-19 With your heart disease it is especially important for people at increased risk of severe illness from COVID-19, and those who live with them, to protect themselves from getting COVID-19. The best way to protect yourself and to help reduce the spread of the virus that causes COVID-19 is to: Marland Kitchen Limit your interactions with other people as much as possible. . Take precautions to prevent getting COVID-19 when you do interact with others. If you start feeling sick and think you may have COVID-19, get in touch with your healthcare provider within 24  At [facility], you and your health needs are our priority.  As part of our continuing mission to provide you with exceptional heart care, we have created designated Provider Care Teams.  These Care Teams include your primary Cardiologist (physician) and Advanced Practice Providers (APPs -  Physician Assistants and Nurse Practitioners) who all work together to provide you with the care you need, when you need it.  Thank you for choosing [facility]!!

## 2019-12-20 ENCOUNTER — Other Ambulatory Visit: Payer: Self-pay | Admitting: Physician Assistant

## 2019-12-20 NOTE — Telephone Encounter (Signed)
Please review for refill, Thanks !  
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
## 2020-01-17 ENCOUNTER — Ambulatory Visit (INDEPENDENT_AMBULATORY_CARE_PROVIDER_SITE_OTHER): Payer: Medicare Other | Admitting: Cardiovascular Disease

## 2020-01-17 ENCOUNTER — Other Ambulatory Visit: Payer: Self-pay

## 2020-01-17 ENCOUNTER — Encounter: Payer: Self-pay | Admitting: Cardiovascular Disease

## 2020-01-17 VITALS — BP 138/64 | HR 78 | Temp 97.1°F | Ht 63.0 in | Wt 137.0 lb

## 2020-01-17 DIAGNOSIS — I10 Essential (primary) hypertension: Secondary | ICD-10-CM | POA: Diagnosis not present

## 2020-01-17 DIAGNOSIS — I6523 Occlusion and stenosis of bilateral carotid arteries: Secondary | ICD-10-CM | POA: Diagnosis not present

## 2020-01-17 DIAGNOSIS — I739 Peripheral vascular disease, unspecified: Secondary | ICD-10-CM

## 2020-01-17 DIAGNOSIS — E785 Hyperlipidemia, unspecified: Secondary | ICD-10-CM

## 2020-01-17 DIAGNOSIS — I25118 Atherosclerotic heart disease of native coronary artery with other forms of angina pectoris: Secondary | ICD-10-CM
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
## 2020-01-17 NOTE — Patient Instructions (Signed)
Medication Instructions:  No changes *If you need a refill on your cardiac medications before your next appointment, please call your pharmacy*  Lab Work: None ordered If you have labs (blood work) drawn today and your tests are completely normal, you will receive your results only by: Marland Kitchen MyChart Message (if you have MyChart) OR . A paper copy in the mail If you have any lab test that is abnormal or we need to change your treatment, we will call you to review the results.  Testing/Procedures: Your physician has requested that you have a lower extremity arterial duplex in June. During this test, ultrasound is used to evaluate arterial blood flow in the legs. Allow one hour for this exam. There are no restrictions or special instructions. This will take place at [address] [place], [address].  Your physician has requested that you have an ankle brachial index (ABI) in June. During this test an ultrasound and blood pressure cuff are used to evaluate the arteries that supply the arms and legs with blood. Allow thirty minutes for this exam. There are no restrictions or special instructions. This will take place at [place], [address].   Your physician has requested that you have an Aorta/Iliac Duplex in June. This will be take place at [place], [address].    No food after 11PM the night before.  Water is OK. (Don't drink liquids if you have been instructed not to for ANOTHER test).  Take two Extra-Strength Gas-X capsules at bedtime the night before test.   Take an additional two Extra-Strength Gas-X capsules three (3) hours before the test or first thing in the morning.    Avoid foods that produce bowel gas, for 24 hours prior to exam (see below).    No breakfast, no chewing gum, no smoking or carbonated beverages.  Patient may take morning medications with water.  Come in for test at least 15 minutes early to register.  Follow-Up: At [facility], you and your  health needs are our priority.  As part of our continuing mission to provide you with exceptional heart care, we have created designated Provider Care Teams.  These Care Teams include your primary Cardiologist (physician) and Advanced Practice Providers (APPs -  Physician Assistants and Nurse Practitioners) who all work together to provide you with the care you need, when you need it.  Your next appointment:   6 month(s)  The format for your next appointment:   In Person  Provider:   Kathlyn Sacramento, MD

## 2020-01-30 ENCOUNTER — Other Ambulatory Visit: Payer: Self-pay | Admitting: Physician Assistant

## 2020-01-30 ENCOUNTER — Ambulatory Visit
Admission: RE | Admit: 2020-01-30 | Discharge: 2020-01-30 | Disposition: A | Discharge status: Home / Self Care | Payer: Medicare Other | Source: Ambulatory Visit | Attending: Emergency Medicine | Admitting: Emergency Medicine

## 2020-01-30 ENCOUNTER — Other Ambulatory Visit: Payer: Self-pay

## 2020-01-30 DIAGNOSIS — R918 Other nonspecific abnormal finding of lung field: Secondary | ICD-10-CM

## 2020-01-31 NOTE — Telephone Encounter (Signed)
Please review for refill, Thanks !  

## 2020-02-07 ENCOUNTER — Other Ambulatory Visit: Payer: Self-pay

## 2020-02-07 ENCOUNTER — Encounter: Payer: Self-pay | Admitting: Sports Medicine

## 2020-02-07 ENCOUNTER — Ambulatory Visit (INDEPENDENT_AMBULATORY_CARE_PROVIDER_SITE_OTHER): Payer: Medicare Other | Admitting: Sports Medicine

## 2020-02-07 VITALS — Temp 97.8°F

## 2020-02-07 DIAGNOSIS — M79675 Pain in left toe(s): Secondary | ICD-10-CM | POA: Diagnosis not present

## 2020-02-07 DIAGNOSIS — M19079 Primary osteoarthritis, unspecified ankle and foot: Secondary | ICD-10-CM

## 2020-02-07 DIAGNOSIS — E1165 Type 2 diabetes mellitus with hyperglycemia: Secondary | ICD-10-CM

## 2020-02-07 DIAGNOSIS — B351 Tinea unguium: Secondary | ICD-10-CM

## 2020-02-07 DIAGNOSIS — IMO0002 Reserved for concepts with insufficient information to code with codable children: Secondary | ICD-10-CM

## 2020-02-07 DIAGNOSIS — M79674 Pain in right toe(s): Secondary | ICD-10-CM

## 2020-02-07 DIAGNOSIS — E1140 Type 2 diabetes mellitus with diabetic neuropathy, unspecified: Secondary | ICD-10-CM

## 2020-02-07 MED ORDER — DICLOFENAC SODIUM 1 % EX GEL: CUTANEOUS | 2 refills | Status: DC

## 2020-02-07 NOTE — Progress Notes (Signed)
Subjective: Alejandra Hernandez is a 64 y.o. female patient with history of diabetes who presents to office today complaining of long,mildly painful nails  while ambulating in shoes; unable to trim. Patient states that the glucose reading this morning was 209 this morning last A1c of 8.1.  Admits pain all over. No other issues.   Patient Active Problem List   Diagnosis Date Noted  . Complaints of total body pain 07/10/2019  . Left upper extremity swelling 06/03/2019  . COPD, mild ([place]) 05/26/2019  . Leukocytosis 04/14/2019  . Avascular necrosis of humeral head, left ([place]) 04/14/2019  . Hypokalemia 04/14/2019  . Neuropathy 04/14/2019  . DKA (diabetic ketoacidoses) ([place]) 04/13/2019  . Polyp of transverse colon   . Atypical chest pain 11/16/2018  . Uncontrolled insulin dependent diabetes mellitus 05/23/2018  . CKD (chronic kidney disease), stage III 05/14/2018  . Hemoptysis 02/24/2018  . Hyperglycemia 12/23/2017  . Low TSH level 06/22/2017  . CAD (coronary artery disease)   . Nodule on liver 05/22/2017  . Heart palpitations 05/14/2017  . Unspecified skin changes 04/18/2017  . History of tobacco use 03/17/2017  . Benign neoplasm of descending colon   . Benign neoplasm of sigmoid colon   . Chronic diastolic CHF (congestive heart failure) ([place]) 02/09/2017  . History of DVT (deep vein thrombosis)   . Abnormal CT scan, chest   . Acute urinary retention   . Claudication ([place]) 07/23/2016  . PAD (peripheral artery disease) ([place])   . Essential hypertension 05/14/2016  . Uncontrolled type 2 diabetes mellitus with ketoacidosis without coma, with long-term current use of insulin ([place])   . GERD (gastroesophageal reflux disease) 05/17/2014  . Carotid artery disease ([place]) 05/17/2014  . Iron deficiency anemia 04/26/2014  . Dyspnea 04/25/2014  . Carotid artery bruit 04/25/2014  . Hyperlipidemia with target low density lipoprotein (LDL) cholesterol less than 70 mg/dL   . Gout    Current  Outpatient Medications on File Prior to Visit  Medication Sig Dispense Refill  . acetaminophen (TYLENOL) 325 MG tablet Take 325 mg by mouth 2 (two) times a day.     . albuterol (PROAIR HFA) 108 (90 Base) MCG/ACT inhaler Inhale 2 puffs into the lungs every 4 (four) hours as needed for wheezing or shortness of breath. 1 Inhaler 5  . amitriptyline (ELAVIL) 25 MG tablet TK 1 T PO HS FOR 7 DAYS THEN 2 PO HS FOR 7 DAYS THEN 3 PO HS    . amLODipine (NORVASC) 10 MG tablet Take 10 mg by mouth at bedtime.    Marland Kitchen aspirin 81 MG chewable tablet Chew 1 tablet (81 mg total) by mouth daily. 30 tablet 3  . Calcium Carbonate Antacid (TUMS PO) Take 2 tablets by mouth 3 (three) times daily as needed (acid indigestion).     . cloNIDine (CATAPRES) 0.1 MG tablet Take 0.1 mg by mouth 2 (two) times daily.    . Continuous Blood Gluc Receiver (FREESTYLE LIBRE 14 DAY READER) DEVI Scan as needed.    . diclofenac sodium (VOLTAREN) 1 % GEL Apply 4 g topically 4 (four) times daily. 150 g 2  . ezetimibe (ZETIA) 10 MG tablet TAKE 1 TABLET(10 MG) BY MOUTH DAILY 90 tablet 2  . ferrous sulfate 325 (65 FE) MG tablet Take 325 mg by mouth daily with breakfast.     . furosemide (LASIX) 20 MG tablet TAKE 1 TABLET(20 MG) BY MOUTH EVERY MORNING (Patient taking differently: Take 20 mg by mouth daily. ) 30 tablet 5  .  gabapentin (NEURONTIN) 600 MG tablet Take 0.5 tablets (300 mg total) by mouth 3 (three) times daily. 90 tablet 2  . glucose blood (ACCU-CHEK AVIVA PLUS) test strip 1 each by Other route 2 (two) times daily. And lancets 2/day 200 each 3  . hydroxypropyl methylcellulose / hypromellose (ISOPTO TEARS / GONIOVISC) 2.5 % ophthalmic solution Place 1 drop into both eyes 3 (three) times daily as needed for dry eyes.    . Insulin Degludec (TRESIBA FLEXTOUCH) 200 UNIT/ML SOPN Inject 68 Units into the skin daily. And pen needles 1/day (Patient taking differently: Inject 68 Units into the skin at bedtime. And pen needles 1/day) 9 pen 11  .  insulin lispro (HUMALOG) 100 UNIT/ML injection Inject 1-10 Units into the skin 3 (three) times daily before meals. Per sliding scale    . isosorbide mononitrate (IMDUR) 30 MG 24 hr tablet Take 3 tablets (90 mg total) by mouth daily. 270 tablet 0  . lidocaine-prilocaine (EMLA) cream Apply 1 application topically See admin instructions. Apply 2-3 grams (1 gram = 1 inch) to back 4 times daily for pain    . nitroGLYCERIN (NITROSTAT) 0.4 MG SL tablet PLACE 1 TABLET UNDER THE TONGUE EVERY 5 MINUTES AS NEEDED FOR CHEST PAIN (Patient taking differently: Place 0.4 mg under the tongue every 5 (five) minutes as needed for chest pain. ) 25 tablet 3  . NON FORMULARY Accu-Chek Aviva Plus test strips  USE FOUR TIMES DAILY    . oxyCODONE-acetaminophen (PERCOCET) 10-325 MG tablet Take 1 tablet by mouth 4 (four) times daily as needed for pain.   0  . pantoprazole (PROTONIX) 40 MG tablet TAKE 1 TABLET BY MOUTH TWICE DAILY BEFORE A MEAL (Patient taking differently: Take 40 mg by mouth 2 (two) times daily. ) 180 tablet 0  . polyethylene glycol powder (MIRALAX) 17 GM/SCOOP powder Start taking 1 capful 3 times a day. Slowly cut back as needed until you have normal bowel movements. 255 g 0  . potassium chloride SA (KLOR-CON) 20 MEQ tablet TAKE 1/2 TABLET BY MOUTH EVERY DAY 90 tablet 1  . PRESCRIPTION MEDICATION APPLY 1 APPLICATION TOPICALLY  Cream compounded at [facility] - B5, D3 G10, K10 Pent+ - apply 1 gram (1 gram = 1 pump) to affected area 4 times daily as needed for pain    . rivaroxaban (XARELTO) 20 MG TABS tablet Take 1 tablet (20 mg total) by mouth daily with supper. 30 tablet 11  . rosuvastatin (CRESTOR) 40 MG tablet Take 1 tablet (40 mg total) by mouth daily at 6 PM. 90 tablet 0  . Vitamin D, Ergocalciferol, (DRISDOL) 50000 units CAPS capsule Take 50,000 Units by mouth every Wednesday.   10   No current facility-administered medications on file prior to visit.   Allergies  Allergen Reactions  .  Lisinopril Hives      Objective: General: Patient is awake, alert, and oriented x 3 and in no acute distress.  Integument: Skin is warm, dry and supple bilateral. Nails are tender, long, thickened and  dystrophic with subungual debris, consistent with onychomycosis, 1-5 bilateral. No signs of infection. Mild callus to left heel. Remaining integument unremarkable.  Vasculature:  Dorsalis Pedis pulse 1/4 bilateral. Posterior Tibial pulse  1/4 bilateral.  Capillary fill time <3 sec 1-5 bilateral. Positive hair growth to the level of the digits. Temperature gradient within normal limits. No varicosities present bilateral. No edema present bilateral.   Neurology: The patient has diminished sensation measured with a 5.07/10g Semmes Weinstein Monofilament at  all pedal sites bilateral. Vibratory sensation diminished bilateral with tuning fork. No Babinski sign present bilateral.   Musculoskeletal: Asymptomatic hallux limitus, pes planus, hammertoe pedal deformities noted bilateral. Muscular strength 4/5 in all lower extremity muscular groups bilateral without pain on range of motion . No tenderness with calf compression bilateral.  Assessment and Plan: Problem List Items Addressed This Visit    None    Visit Diagnoses    Pain due to onychomycosis of toenails of both feet    -  Primary   Type 2 diabetes, uncontrolled, with neuropathy ([place])         -Examined patient. -Re-Discussed and educated patient on diabetic foot care -Mechanically debrided all nails 1-5 bilateral using sterile nail nipper and filed with dremel without incident  -Debrided callus to left heel at no charge using sterile chisel blade -Dispensed heel cushions to use as instructed -Advised patient to discuss with PCP about neuropathy meds to help with pain and encouraged control of blood sugars -Refilled diclofenac gel -Patient to return  in 3 months for at risk foot nail care -Patient advised to call the office if any  problems or questions arise in the meantime.  Landis Martins, DPM

## 2020-02-17 ENCOUNTER — Telehealth: Payer: Self-pay | Admitting: Cardiology

## 2020-02-17 NOTE — Telephone Encounter (Signed)
Patient needs to go to [facility] ER today

## 2020-02-17 NOTE — Telephone Encounter (Signed)
Advised patient that she needs to go to the ER today. Patient verbalized understanding.

## 2020-02-17 NOTE — Telephone Encounter (Signed)
Patient states that she has increased swelling in her feet and ankles. She has gained 12 lbs in the past month. She reports some increased shortness of breath with exertion. She has been elevating her feet and using compression hose. She takes an extra dose of Lasix 20 mg at night sometimes when her swelling is increased.  She also reports that she has been using nitroglycerin frequently. She states that she has used it twice in the past week. She has had to take 2 nitro and a baby aspirin for relief of chest pain. She reports difficulty breathing and sweating during these episodes.  She currently is not having any symptoms other than swelling. Attempted to get patient an appointment at the beginning of next week but she states she wont be able to come in then. Advised patient that she may need to go to the ER to be evaluated, she states that if her chest pain returns then she will.

## 2020-02-17 NOTE — Telephone Encounter (Signed)
Pt c/o swelling: STAT is pt has developed SOB within 24 hours  1) How much weight have you gained and in what time span? 12 lbs in 1 month  2) If swelling, where is the swelling located? In legs and feet  3) Are you currently taking a fluid pill? Yes  4) Are you currently SOB? No. However, the patient has experienced SOB with excurcion per Angelina  5) Do you have a log of your daily weights (if so, list)?  02/26: 128 lbs 03/01: 133 lbs  03/15: 135 lbs 03/26: 137 lbs  6) Have you gained 3 pounds in a day or 5 pounds in a week?  7) Have you traveled recently? No  Phone number for Marjorie Smolder with [employer] lost and unable to be retrieved. Please return call to the patient to discuss.

## 2020-02-19 ENCOUNTER — Emergency Department (HOSPITAL_COMMUNITY)
Admission: EM | Admit: 2020-02-19 | Discharge: 2020-02-20 | Disposition: A | Discharge status: Home / Self Care | Payer: Medicare Other | Attending: Emergency Medicine | Admitting: Emergency Medicine

## 2020-02-19 ENCOUNTER — Encounter (HOSPITAL_COMMUNITY): Payer: Self-pay | Admitting: Emergency Medicine

## 2020-02-19 ENCOUNTER — Emergency Department (HOSPITAL_COMMUNITY): Payer: Medicare Other

## 2020-02-19 ENCOUNTER — Other Ambulatory Visit: Payer: Self-pay

## 2020-02-19 DIAGNOSIS — R079 Chest pain, unspecified: Secondary | ICD-10-CM | POA: Insufficient documentation

## 2020-02-19 DIAGNOSIS — Z794 Long term (current) use of insulin: Secondary | ICD-10-CM | POA: Diagnosis not present

## 2020-02-19 DIAGNOSIS — I5032 Chronic diastolic (congestive) heart failure: Secondary | ICD-10-CM | POA: Diagnosis not present

## 2020-02-19 DIAGNOSIS — I2510 Atherosclerotic heart disease of native coronary artery without angina pectoris: Secondary | ICD-10-CM | POA: Insufficient documentation

## 2020-02-19 DIAGNOSIS — I130 Hypertensive heart and chronic kidney disease with heart failure and stage 1 through stage 4 chronic kidney disease, or unspecified chronic kidney disease: Secondary | ICD-10-CM | POA: Insufficient documentation

## 2020-02-19 DIAGNOSIS — R0789 Other chest pain: Secondary | ICD-10-CM | POA: Diagnosis present

## 2020-02-19 DIAGNOSIS — Z79899 Other long term (current) drug therapy: Secondary | ICD-10-CM | POA: Diagnosis not present

## 2020-02-19 DIAGNOSIS — N183 Chronic kidney disease, stage 3 unspecified: Secondary | ICD-10-CM | POA: Insufficient documentation

## 2020-02-19 DIAGNOSIS — E1122 Type 2 diabetes mellitus with diabetic chronic kidney disease: Secondary | ICD-10-CM | POA: Insufficient documentation

## 2020-02-19 DIAGNOSIS — Z87891 Personal history of nicotine dependence: Secondary | ICD-10-CM | POA: Insufficient documentation

## 2020-02-19 DIAGNOSIS — Z7982 Long term (current) use of aspirin: Secondary | ICD-10-CM | POA: Insufficient documentation

## 2020-02-19 LAB — TROPONIN I (HIGH SENSITIVITY)
Troponin I (High Sensitivity): 9 ng/L (ref <18)
Troponin I (High Sensitivity): 10 ng/L (ref <18)

## 2020-02-19 LAB — CBC
HCT: 35.6 % — ABNORMAL LOW (ref 36.0–46.0)
Hemoglobin: 10.8 g/dL — ABNORMAL LOW (ref 12.0–15.0)
MCH: 21.3 pg — ABNORMAL LOW (ref 26.0–34.0)
MCHC: 30.3 g/dL (ref 30.0–36.0)
MCV: 70.2 fL — ABNORMAL LOW (ref 80.0–100.0)
Platelets: 356 10*3/uL (ref 150–400)
RBC: 5.07 MIL/uL (ref 3.87–5.11)
RDW: 16.7 % — ABNORMAL HIGH (ref 11.5–15.5)
WBC: 7.2 10*3/uL (ref 4.0–10.5)
nRBC: 0 % (ref 0.0–0.2)

## 2020-02-19 LAB — BASIC METABOLIC PANEL
Anion gap: 11 (ref 5–15)
BUN: 18 mg/dL (ref 8–23)
CO2: 20 mmol/L — ABNORMAL LOW (ref 22–32)
Calcium: 9.1 mg/dL (ref 8.9–10.3)
Chloride: 105 mmol/L (ref 98–111)
Creatinine, Ser: 1.36 mg/dL — ABNORMAL HIGH (ref 0.44–1.00)
GFR calc Af Amer: 48 mL/min — ABNORMAL LOW (ref >60)
GFR calc non Af Amer: 41 mL/min — ABNORMAL LOW (ref >60)
Glucose, Bld: 170 mg/dL — ABNORMAL HIGH (ref 70–99)
Potassium: 4.3 mmol/L (ref 3.5–5.1)
Sodium: 136 mmol/L (ref 135–145)

## 2020-02-19 MED ORDER — KETOROLAC TROMETHAMINE 15 MG/ML IJ SOLN
15.0000 mg | Freq: Once | INTRAMUSCULAR | Status: AC
  Administered 2020-02-20: 15 mg via INTRAVENOUS
  Filled 2020-02-19: qty 1

## 2020-02-19 MED ORDER — CYCLOBENZAPRINE HCL 10 MG PO TABS: 10.0000 mg | ORAL_TABLET | Freq: Two times a day (BID) | ORAL | 0 refills | Status: DC | PRN

## 2020-02-19 MED ORDER — SODIUM CHLORIDE 0.9% FLUSH
3.0000 mL | Freq: Once | INTRAVENOUS | Status: AC
  Administered 2020-02-19: 22:00:00 3 mL via INTRAVENOUS

## 2020-02-19 MED ORDER — MORPHINE SULFATE (PF) 4 MG/ML IV SOLN
4.0000 mg | Freq: Once | INTRAVENOUS | Status: AC
  Administered 2020-02-20: 4 mg via INTRAVENOUS
  Filled 2020-02-19: qty 1

## 2020-02-19 NOTE — ED Triage Notes (Signed)
C/o L sided chest pain x 2 weeks.  Using NTG at home with some relief.  Mild SOB.  Denies nausea and vomiting.

## 2020-02-20 DIAGNOSIS — R079 Chest pain, unspecified: Secondary | ICD-10-CM | POA: Diagnosis not present

## 2020-02-22 ENCOUNTER — Other Ambulatory Visit: Payer: Self-pay

## 2020-02-22 ENCOUNTER — Ambulatory Visit
Admission: RE | Admit: 2020-02-22 | Discharge: 2020-02-22 | Disposition: A | Discharge status: Home / Self Care | Payer: Medicare Other | Source: Ambulatory Visit | Attending: Orthopedic Surgery | Admitting: Orthopedic Surgery

## 2020-02-22 ENCOUNTER — Other Ambulatory Visit: Payer: Self-pay | Admitting: Orthopedic Surgery

## 2020-02-22 DIAGNOSIS — M25512 Pain in left shoulder: Secondary | ICD-10-CM

## 2020-02-22 MED ORDER — IOPAMIDOL (ISOVUE-300) INJECTION 61%
100.0000 mL | Freq: Once | INTRAVENOUS | Status: AC | PRN
  Administered 2020-02-22: 100 mL via INTRAVENOUS

## 2020-02-23 ENCOUNTER — Telehealth: Payer: Self-pay
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
## 2020-02-23 NOTE — Telephone Encounter (Signed)
NOTES ON FILE FROM [facility]  SENT REFERRAL TO SCHEDULING

## 2020-03-10 ENCOUNTER — Other Ambulatory Visit: Payer: Self-pay | Admitting: General Practice

## 2020-05-15 ENCOUNTER — Ambulatory Visit (INDEPENDENT_AMBULATORY_CARE_PROVIDER_SITE_OTHER): Payer: Medicare Other | Admitting: Sports Medicine

## 2020-05-15 ENCOUNTER — Encounter: Payer: Self-pay | Admitting: Sports Medicine

## 2020-05-15 ENCOUNTER — Other Ambulatory Visit: Payer: Self-pay

## 2020-05-15 DIAGNOSIS — M79675 Pain in left toe(s): Secondary | ICD-10-CM

## 2020-05-15 DIAGNOSIS — B351 Tinea unguium: Secondary | ICD-10-CM | POA: Diagnosis not present

## 2020-05-15 DIAGNOSIS — IMO0002 Reserved for concepts with insufficient information to code with codable children: Secondary | ICD-10-CM

## 2020-05-15 DIAGNOSIS — M79674 Pain in right toe(s): Secondary | ICD-10-CM

## 2020-05-15 DIAGNOSIS — I739 Peripheral vascular disease, unspecified: Secondary | ICD-10-CM | POA: Diagnosis not present

## 2020-05-15 DIAGNOSIS — E1140 Type 2 diabetes mellitus with diabetic neuropathy, unspecified: Secondary | ICD-10-CM | POA: Diagnosis not present

## 2020-05-15 DIAGNOSIS — E1165 Type 2 diabetes mellitus with hyperglycemia: Secondary | ICD-10-CM

## 2020-05-15 DIAGNOSIS — I82401 Acute embolism and thrombosis of unspecified deep veins of right lower extremity: Secondary | ICD-10-CM

## 2020-05-15 NOTE — Progress Notes (Signed)
Subjective: Alejandra Hernandez is a 64 y.o. female patient with history of diabetes who presents to office today complaining of long,mildly painful nails  while ambulating in shoes; unable to trim.  Patient also reports that over the weekend experienced some pain and swelling in her right calf and is concerned that her stents may be blocked.  Patient denies any other pain outside the calf but did have an episode of feeling sweaty over the weekend and some chest discomfort.  Patient states that the glucose reading this morning was not recorded. No other issues.   Patient Active Problem List   Diagnosis Date Noted  . Complaints of total body pain 07/10/2019  . Left upper extremity swelling 06/03/2019  . COPD, mild ([place]) 05/26/2019  . Leukocytosis 04/14/2019  . Avascular necrosis of humeral head, left ([place]) 04/14/2019  . Hypokalemia 04/14/2019  . Neuropathy 04/14/2019  . DKA (diabetic ketoacidoses) ([place]) 04/13/2019  . Polyp of transverse colon   . Atypical chest pain 11/16/2018  . Uncontrolled insulin dependent diabetes mellitus 05/23/2018  . CKD (chronic kidney disease), stage III 05/14/2018  . Hemoptysis 02/24/2018  . Hyperglycemia 12/23/2017  . Low TSH level 06/22/2017  . CAD (coronary artery disease)   . Nodule on liver 05/22/2017  . Heart palpitations 05/14/2017  . Unspecified skin changes 04/18/2017  . History of tobacco use 03/17/2017  . Benign neoplasm of descending colon   . Benign neoplasm of sigmoid colon   . Chronic diastolic CHF (congestive heart failure) ([place]) 02/09/2017  . History of DVT (deep vein thrombosis)   . Abnormal CT scan, chest   . Acute urinary retention   . Claudication ([place]) 07/23/2016  . PAD (peripheral artery disease) ([place])   . Essential hypertension 05/14/2016  . Uncontrolled type 2 diabetes mellitus with ketoacidosis without coma, with long-term current use of insulin ([place])   . GERD (gastroesophageal reflux disease) 05/17/2014  . Carotid artery disease  ([place]) 05/17/2014  . Iron deficiency anemia 04/26/2014  . Dyspnea 04/25/2014  . Carotid artery bruit 04/25/2014  . Hyperlipidemia with target low density lipoprotein (LDL) cholesterol less than 70 mg/dL   . Gout    Current Outpatient Medications on File Prior to Visit  Medication Sig Dispense Refill  . acetaminophen (TYLENOL) 325 MG tablet Take 650 mg by mouth 2 (two) times a day.     . albuterol (PROAIR HFA) 108 (90 Base) MCG/ACT inhaler Inhale 2 puffs into the lungs every 4 (four) hours as needed for wheezing or shortness of breath. 1 Inhaler 5  . amitriptyline (ELAVIL) 25 MG tablet Take 75 mg by mouth at bedtime.     Marland Kitchen amLODipine (NORVASC) 10 MG tablet Take 10 mg by mouth at bedtime.    Marland Kitchen amoxicillin (AMOXIL) 500 MG capsule Take 2,000 mg by mouth See admin instructions. Before dental appointments    . aspirin 81 MG chewable tablet Chew 1 tablet (81 mg total) by mouth daily. 30 tablet 3  . Blood Glucose Monitoring Suppl (ACCU-CHEK GUIDE ME) w/Device KIT 4 (four) times daily. for testing    . Calcium Carbonate Antacid (TUMS PO) Take 2 tablets by mouth 3 (three) times daily as needed (acid indigestion).     . cloNIDine (CATAPRES) 0.1 MG tablet Take 0.1 mg by mouth 2 (two) times daily.    . Continuous Blood Gluc Receiver (FREESTYLE LIBRE 14 DAY READER) DEVI Scan as needed.    . cyclobenzaprine (FLEXERIL) 10 MG tablet Take 1 tablet (10 mg total) by mouth 2 (two)  times daily as needed for muscle spasms. 20 tablet 0  . diclofenac Sodium (VOLTAREN) 1 % GEL Apply to feet for pain as needed (Patient taking differently: Apply 1 application topically daily as needed (pain). Apply to feet for pain as needed) 150 g 2  . ezetimibe (ZETIA) 10 MG tablet TAKE 1 TABLET(10 MG) BY MOUTH DAILY 90 tablet 2  . ferrous sulfate 325 (65 FE) MG tablet Take 325 mg by mouth daily with breakfast.     . furosemide (LASIX) 20 MG tablet TAKE 1 TABLET(20 MG) BY MOUTH EVERY MORNING (Patient taking differently: Take 20 mg by  mouth daily. ) 30 tablet 5  . gabapentin (NEURONTIN) 600 MG tablet Take 0.5 tablets (300 mg total) by mouth 3 (three) times daily. 90 tablet 2  . glucose blood (ACCU-CHEK AVIVA PLUS) test strip 1 each by Other route 2 (two) times daily. And lancets 2/day 200 each 3  . hydroxypropyl methylcellulose / hypromellose (ISOPTO TEARS / GONIOVISC) 2.5 % ophthalmic solution Place 1 drop into both eyes 3 (three) times daily as needed for dry eyes.    . Insulin Degludec (TRESIBA FLEXTOUCH) 200 UNIT/ML SOPN Inject 68 Units into the skin daily. And pen needles 1/day (Patient taking differently: Inject 10 Units into the skin every morning. And pen needles 1/day) 9 pen 11  . insulin lispro (HUMALOG) 100 UNIT/ML injection Inject 1-10 Units into the skin 3 (three) times daily before meals. Per sliding scale    . isosorbide mononitrate (IMDUR) 30 MG 24 hr tablet TAKE 3 TABLETS(90 MG) BY MOUTH DAILY 270 tablet 2  . lidocaine-prilocaine (EMLA) cream Apply 1 application topically See admin instructions. Apply 2-3 grams (1 gram = 1 inch) to back 4 times daily for pain    . metolazone (ZAROXOLYN) 2.5 MG tablet Take by mouth.    . nitroGLYCERIN (NITROSTAT) 0.4 MG SL tablet PLACE 1 TABLET UNDER THE TONGUE EVERY 5 MINUTES AS NEEDED FOR CHEST PAIN (Patient taking differently: Place 0.4 mg under the tongue every 5 (five) minutes as needed for chest pain. ) 25 tablet 3  . NON FORMULARY Accu-Chek Aviva Plus test strips  USE FOUR TIMES DAILY    . oxyCODONE-acetaminophen (PERCOCET) 10-325 MG tablet Take 1 tablet by mouth 4 (four) times daily as needed for pain.   0  . pantoprazole (PROTONIX) 40 MG tablet TAKE 1 TABLET BY MOUTH TWICE DAILY BEFORE A MEAL (Patient taking differently: Take 40 mg by mouth 2 (two) times daily. ) 180 tablet 0  . polyethylene glycol powder (MIRALAX) 17 GM/SCOOP powder Start taking 1 capful 3 times a day. Slowly cut back as needed until you have normal bowel movements. 255 g 0  . potassium chloride SA  (KLOR-CON) 20 MEQ tablet TAKE 1/2 TABLET BY MOUTH EVERY DAY 90 tablet 1  . PRESCRIPTION MEDICATION APPLY 1 APPLICATION TOPICALLY  Cream compounded at [place] - B5, D3 G10, K10 Pent+ - apply 1 gram (1 gram = 1 pump) to affected area 4 times daily as needed for pain    . rivaroxaban (XARELTO) 20 MG TABS tablet Take 1 tablet (20 mg total) by mouth daily with supper. 30 tablet 11  . Vitamin D, Ergocalciferol, (DRISDOL) 50000 units CAPS capsule Take 50,000 Units by mouth every Wednesday.   10  . rosuvastatin (CRESTOR) 40 MG tablet Take 1 tablet (40 mg total) by mouth daily at 6 PM. 90 tablet 0   No current facility-administered medications on file prior to visit.   Allergies  Allergen Reactions  . Lisinopril Hives      Objective: General: Patient is awake, alert, and oriented x 3 and in no acute distress.  Integument: Skin is warm, dry and supple bilateral. Nails are tender, long, thickened and dystrophic with subungual debris, consistent with onychomycosis, 1-5 bilateral. No signs of infection. Mild callus to bilateral hallux, remaining integument unremarkable.  Vasculature:  Dorsalis Pedis pulse 1/4 bilateral. Posterior Tibial pulse  1/4 bilateral. Capillary fill time <3 sec 1-5 bilateral. Positive hair growth to the level of the digits.Temperature gradient within normal limits with mild increase to right calf with significant edema noted to this area concerning for DVT.   Neurology: The patient has diminished sensation measured with a 5.07/10g Semmes Weinstein Monofilament at all pedal sites bilateral. Vibratory sensation diminished bilateral with tuning fork. No Babinski sign present bilateral.   Musculoskeletal: Asymptomatic hallux limitus, pes planus, hammertoe pedal deformities noted bilateral. Muscular strength 4/5 in all lower extremity muscular groups bilateral without pain on range of motion . No tenderness with calf compression bilateral.  Assessment and Plan: Problem List  Items Addressed This Visit      Cardiovascular and Mediastinum   PAD (peripheral artery disease) (HCC)   Relevant Medications   metolazone (ZAROXOLYN) 2.5 MG tablet    Other Visit Diagnoses    Pain due to onychomycosis of toenails of both feet    -  Primary   Type 2 diabetes, uncontrolled, with neuropathy (HCC)       Acute deep vein thrombosis (DVT) of right lower extremity, unspecified vein (HCC)       Relevant Medications   metolazone (ZAROXOLYN) 2.5 MG tablet     -Examined patient. -Re-Discussed and educated patient on diabetic foot care and new concerning findings of swelling in right calf -Advised patient to go to ER if she cannot get into see her cardiologist today because tomorrow may prove to be too late to have her right calf checked which is concerning because there is increased redness and swelling and pain to the calf concerning for DVT or blockage of stents history of significant PAD -Mechanically debrided all nails 1-5 bilateral using sterile nail nipper and filed with dremel without incident  -Debrided callus to bilateral first toes at no additional charge using a sterile chisel blade without incident -Patient to return  in 3 months for at risk foot nail care and callus care -Patient advised to call the office if any problems or questions arise in the meantime.  Landis Martins, DPM

## 2020-05-16 ENCOUNTER — Ambulatory Visit (HOSPITAL_COMMUNITY)
Admission: RE | Admit: 2020-05-16 | Discharge: 2020-05-16 | Disposition: A | Discharge status: Home / Self Care | Payer: Medicare Other | Source: Ambulatory Visit | Attending: Cardiology | Admitting: Cardiology

## 2020-05-16 ENCOUNTER — Ambulatory Visit (HOSPITAL_BASED_OUTPATIENT_CLINIC_OR_DEPARTMENT_OTHER)
Admission: RE | Admit: 2020-05-16 | Discharge: 2020-05-16 | Disposition: A | Discharge status: Home / Self Care | Payer: Medicare Other | Source: Ambulatory Visit | Attending: Cardiovascular Disease | Admitting: Cardiovascular Disease

## 2020-05-16 DIAGNOSIS — Z95820 Peripheral vascular angioplasty status with implants and grafts: Secondary | ICD-10-CM | POA: Diagnosis present

## 2020-05-16 DIAGNOSIS — I739 Peripheral vascular disease, unspecified: Secondary | ICD-10-CM | POA: Diagnosis not present

## 2020-05-16 DIAGNOSIS — Z9862 Peripheral vascular angioplasty status: Secondary | ICD-10-CM | POA: Diagnosis not present

## 2020-05-28 ENCOUNTER — Other Ambulatory Visit: Payer: Self-pay | Admitting: Cardiology

## 2020-06-05 ENCOUNTER — Encounter: Payer: Self-pay | Admitting: Cardiovascular Disease

## 2020-06-05 ENCOUNTER — Other Ambulatory Visit: Payer: Self-pay

## 2020-06-05 ENCOUNTER — Ambulatory Visit (INDEPENDENT_AMBULATORY_CARE_PROVIDER_SITE_OTHER): Payer: Medicare Other | Admitting: Cardiovascular Disease

## 2020-06-05 VITALS — BP 120/70 | HR 70 | Ht 63.0 in | Wt 146.0 lb

## 2020-06-05 DIAGNOSIS — E785 Hyperlipidemia, unspecified: Secondary | ICD-10-CM | POA: Diagnosis not present

## 2020-06-05 DIAGNOSIS — I2510 Atherosclerotic heart disease of native coronary artery without angina pectoris: Secondary | ICD-10-CM | POA: Diagnosis not present

## 2020-06-05 DIAGNOSIS — I779 Disorder of arteries and arterioles, unspecified: Secondary | ICD-10-CM | POA: Diagnosis not present

## 2020-06-05 DIAGNOSIS — I739 Peripheral vascular disease, unspecified: Secondary | ICD-10-CM | POA: Diagnosis not present
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
## 2020-06-05 NOTE — Patient Instructions (Signed)
Medication Instructions:  NO CHANGE *If you need a refill on your cardiac medications before your next appointment, please call your pharmacy*   Lab Work: If you have labs (blood work) drawn today and your tests are completely normal, you will receive your results only by: Marland Kitchen MyChart Message (if you have MyChart) OR . A paper copy in the mail If you have any lab test that is abnormal or we need to change your treatment, we will call you to review the results.   Follow-Up: At [facility], you and your health needs are our priority.  As part of our continuing mission to provide you with exceptional heart care, we have created designated Provider Care Teams.  These Care Teams include your primary Cardiologist (physician) and Advanced Practice Providers (APPs -  Physician Assistants and Nurse Practitioners) who all work together to provide you with the care you need, when you need it.  We recommend signing up for the patient portal called "MyChart".  Sign up information is provided on this After Visit Summary.  MyChart is used to connect with patients for Virtual Visits (Telemedicine).  Patients are able to view lab/test results, encounter notes, upcoming appointments, etc.  Non-urgent messages can be sent to your provider as well.   To learn more about what you can do with MyChart, go to [email or web address].    Your next appointment:   6 month(s)  The format for your next appointment:   Either In Person or Virtual  Provider:   You may see DR Fletcher Anon or one of the following Advanced Practice Providers on your designated Care Team:    Kerin Ransom, PA-C  [place], [place]  Coletta Memos, [place]

## 2020-06-12 IMAGING — CT CT ABDOMEN AND PELVIS WITHOUT CONTRAST
2 of 4 series · 16 of 46 positions shown, 18 images · non-contrast
Comparison: CT and pelvis dated 05/16/2018

CLINICAL DATA: Abdominal pain.

EXAM:
CT ABDOMEN AND PELVIS WITHOUT CONTRAST
TECHNIQUE: Multidetector CT imaging of the abdomen and pelvis was performed
following the standard protocol without IV contrast.

[Series 2: axial st · axial · 0.77mm/px · z∈[-438,-28]mm · 13 of 92 slices shown, 15 images]
[im 5/92  soft-tissue]
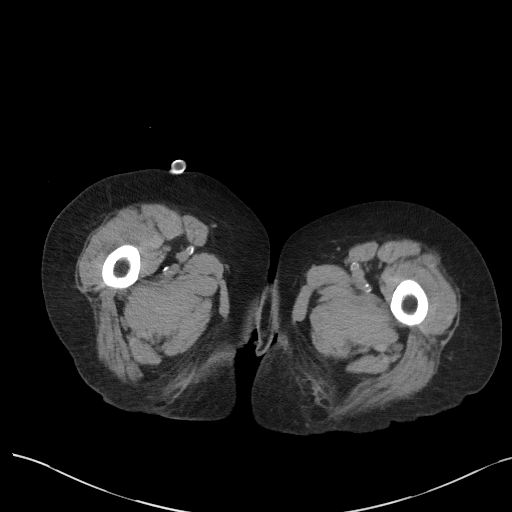
[im 5/92  bone]
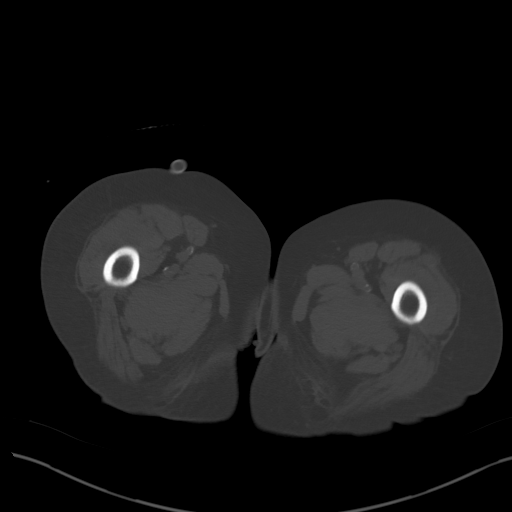
[im 15/92  soft-tissue]
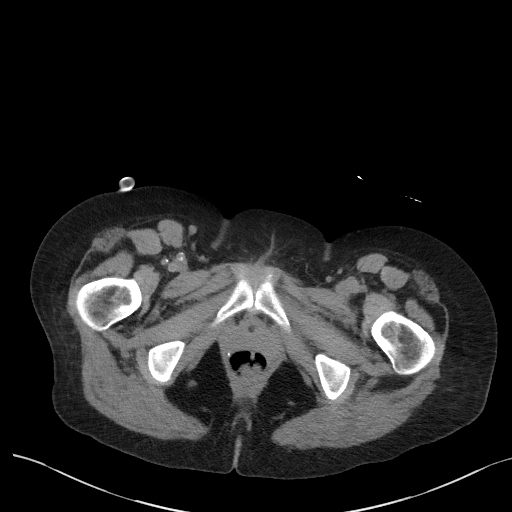
[im 20/92  soft-tissue]
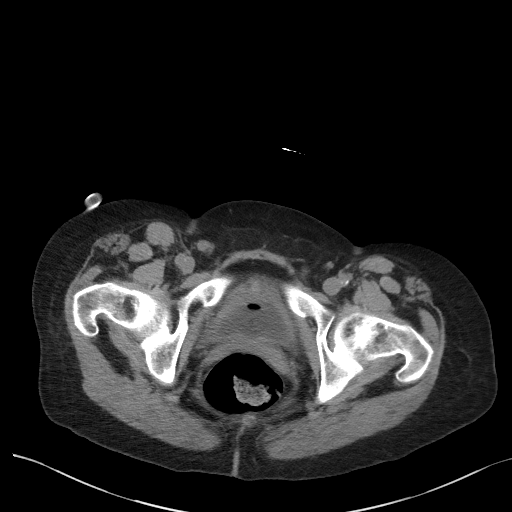
[im 24/92  soft-tissue]
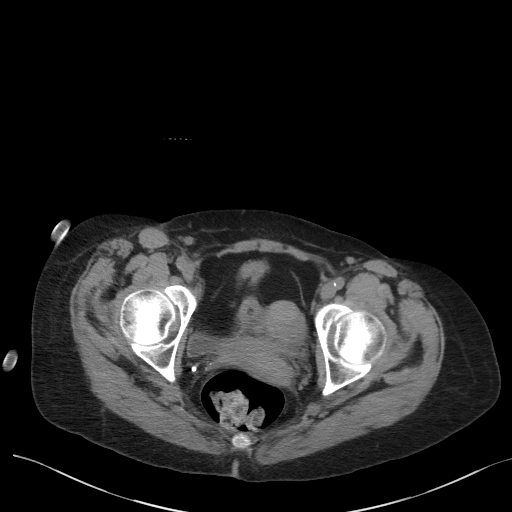
[im 34/92  soft-tissue]
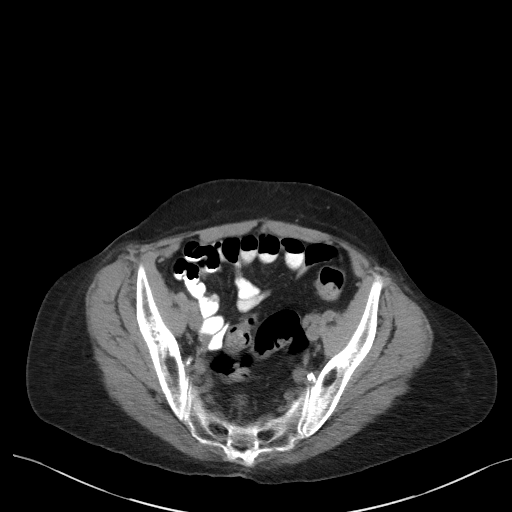
[im 39/92  soft-tissue]
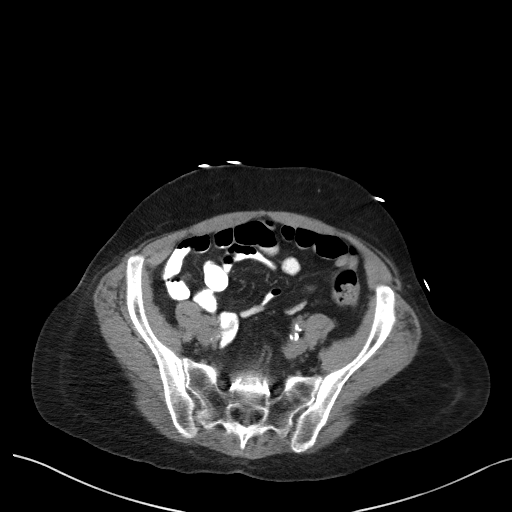
[im 48/92  soft-tissue]
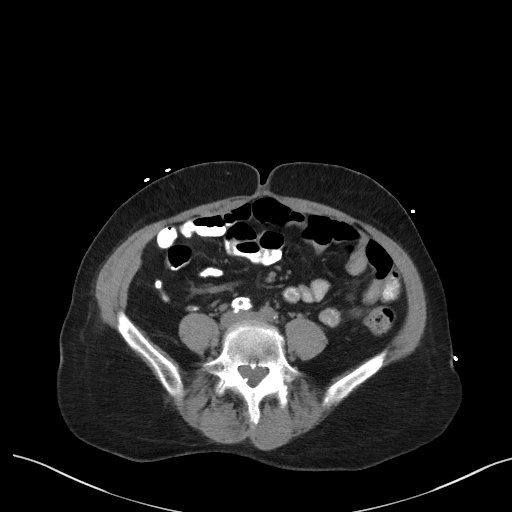
[im 53/92  soft-tissue]
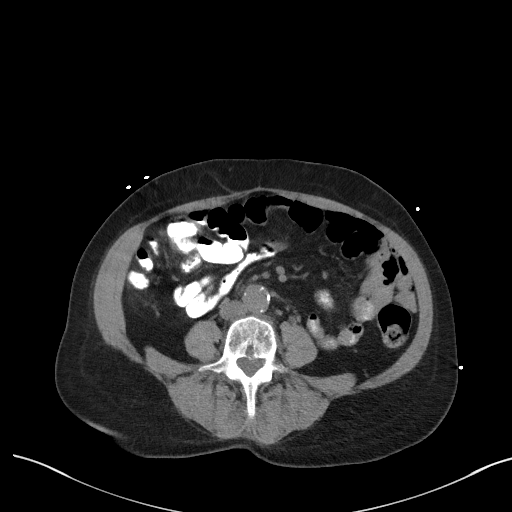
[im 58/92  soft-tissue]
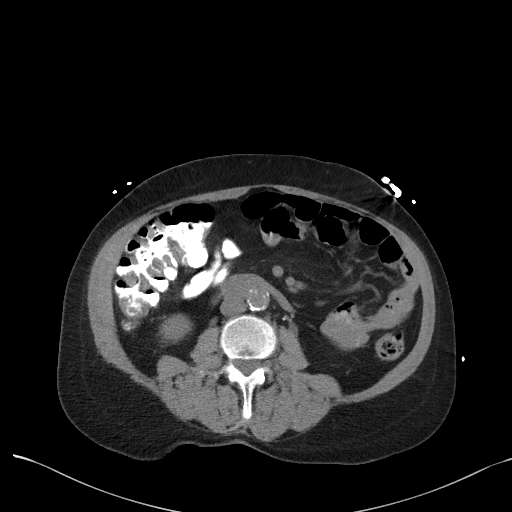
[im 58/92  bone]
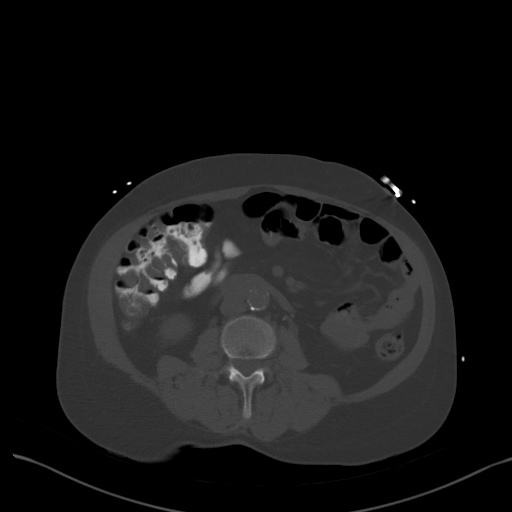
[im 68/92  soft-tissue]
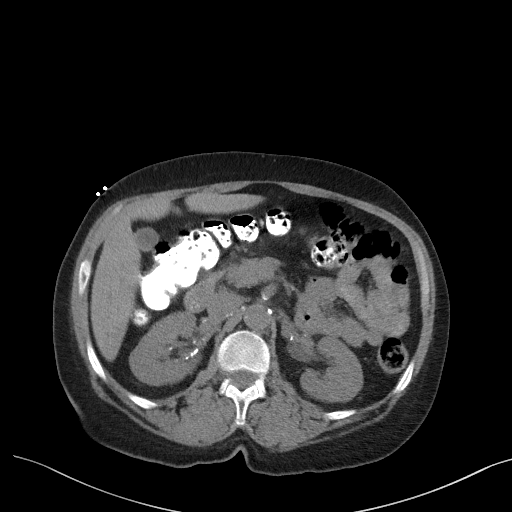
[im 72/92  soft-tissue]
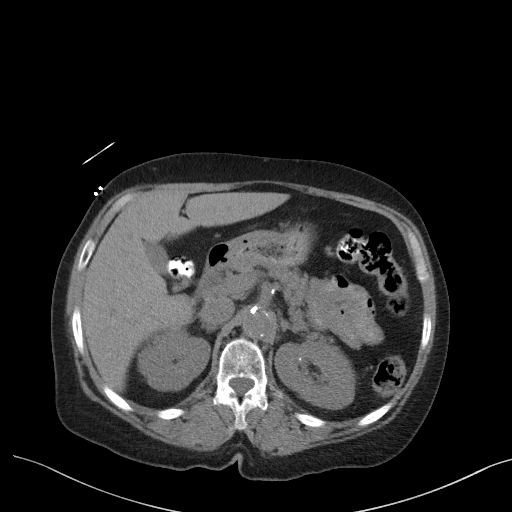
[im 77/92  soft-tissue]
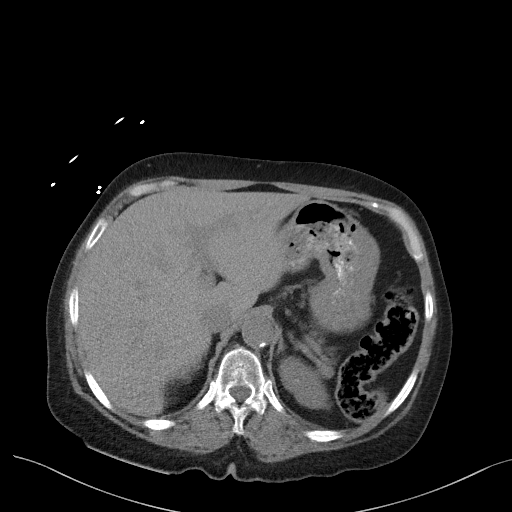
[im 87/92  soft-tissue]
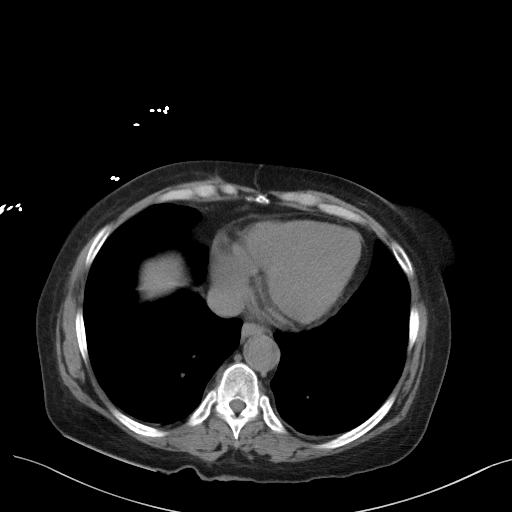

[Series 4: coronal st · coronal · 0.77mm/px · 3 of 84 slices shown]
[im 28/84  soft-tissue]
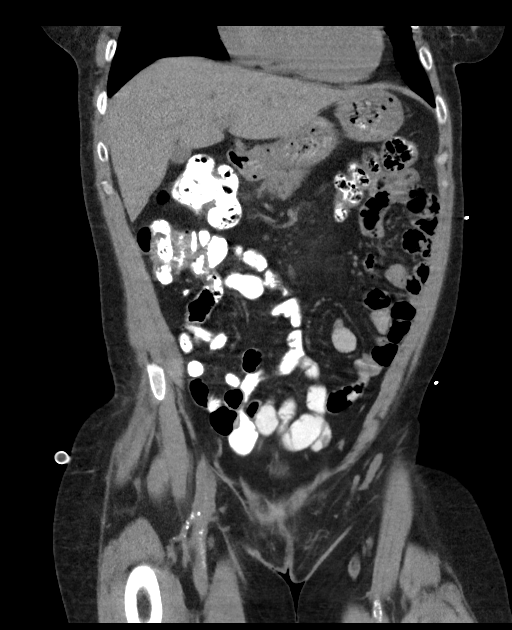
[im 37/84  soft-tissue]
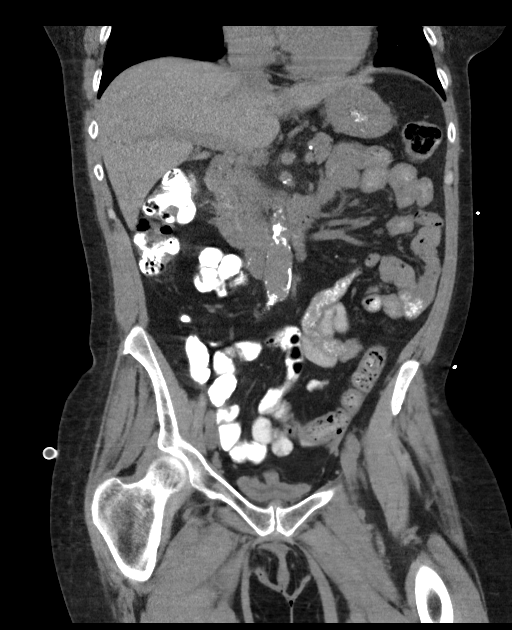
[im 47/84  soft-tissue]
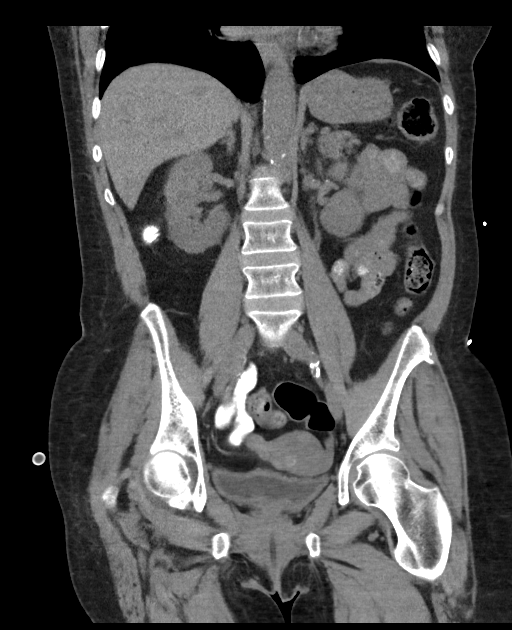

[16 of 46 positions shown; findings below may reference images not displayed]

FINDINGS: Lower chest: No acute abnormality.

Hepatobiliary: No focal liver abnormality is seen. No gallstones,
gallbladder wall thickening, or biliary dilatation.

Pancreas: Unremarkable. No pancreatic ductal dilatation or
surrounding inflammatory changes.

Spleen: Normal in size without focal abnormality.

Adrenals/Urinary Tract: There are multiple calcifications involving
the kidneys, most of which are likely vascular calcifications. Some
may be small punctate nonobstructing stones. The adrenal glands are
unremarkable. The bladder is decompressed with a Foley catheter and
therefore is poorly evaluated.

Stomach/Bowel: There is a moderate amount of stool in the colon.
There is no CT evidence of diverticulitis or colitis. No evidence of
a small-bowel obstruction. Oral contrast is seen to the level of the
splenic flexure. The appendix is normal.

Vascular/Lymphatic: There are no pathologically enlarged lymph
nodes. Atherosclerotic changes are noted of the abdominal aorta
without evidence of an aneurysm. There is a right common iliac
artery stent.

Reproductive: A fibroid uterus is again noted. There is no ovarian
mass.

Other: No abdominal wall hernia or abnormality. No abdominopelvic
ascites.

Musculoskeletal: No acute or significant osseous findings.
IMPRESSION: 1. No acute intra-abdominal abnormality detected. No findings to
explain the patient's lower abdominal pain.
2. Normal appendix in the right lower quadrant. No evidence of a
small-bowel obstruction.
3. The urinary bladder is decompressed by Foley catheter.

Aortic Atherosclerosis ([id number]-[id number].M).

## 2020-06-12 IMAGING — DX LEFT ELBOW - COMPLETE 3+ VIEW
4 series · 4 of 4 positions shown · non-contrast
Comparison: None.

CLINICAL DATA: Several falls over the last few days.

EXAM:
LEFT ELBOW - COMPLETE 3+ VIEW

[elbow ap (1 of 3)]
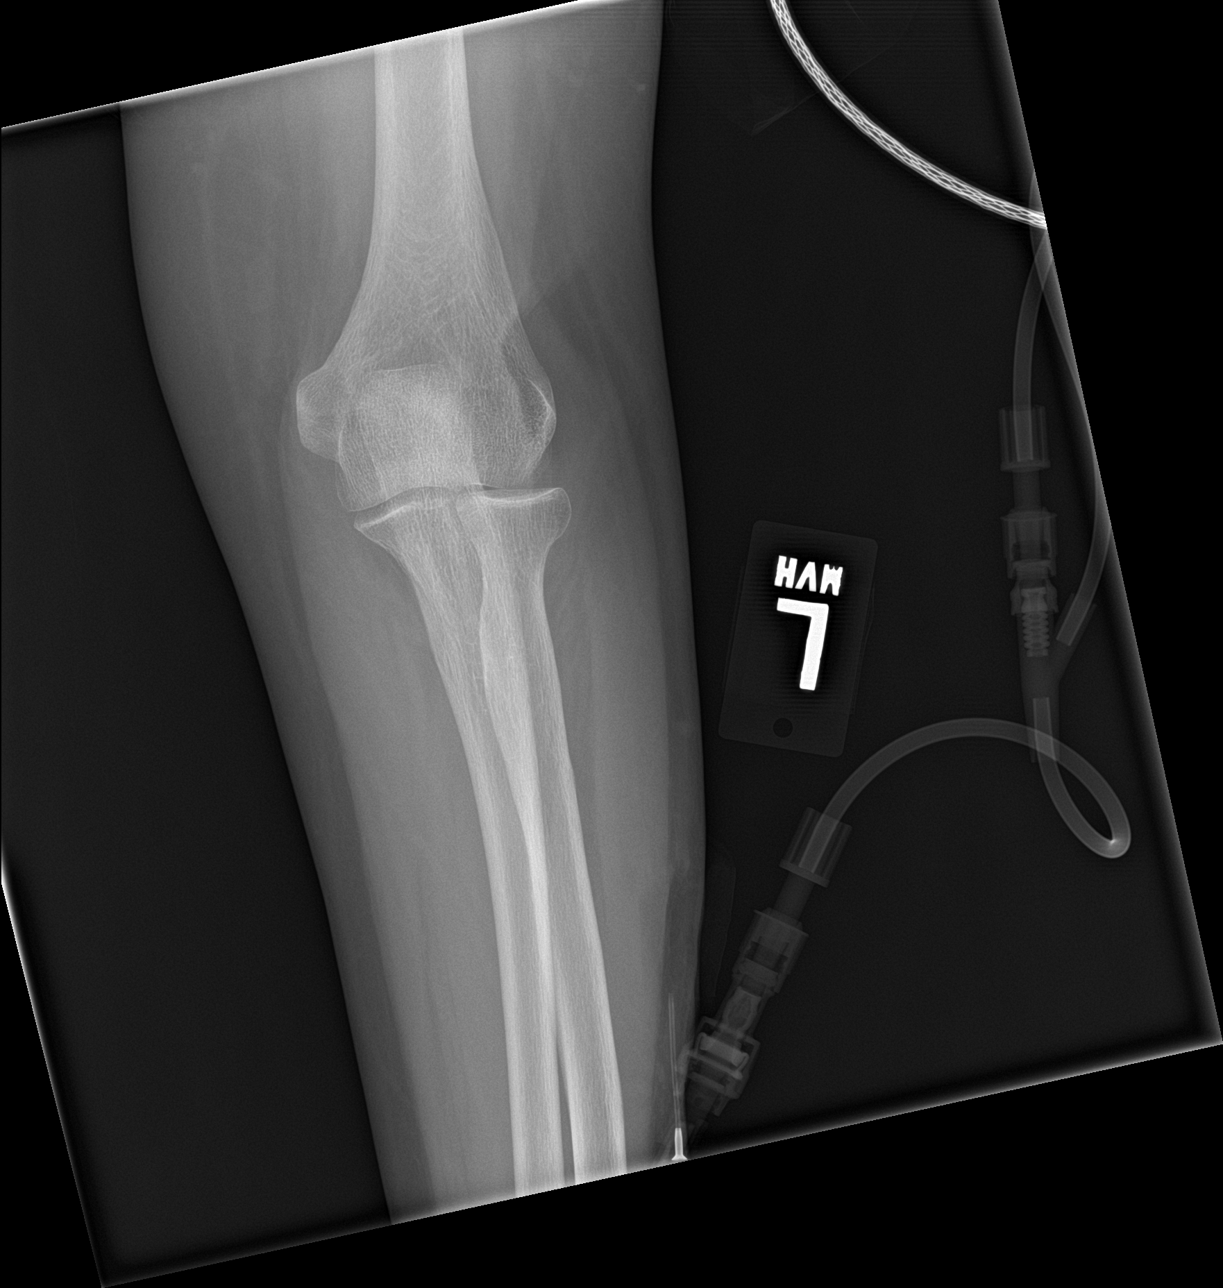

[elbow ap (2 of 3)]
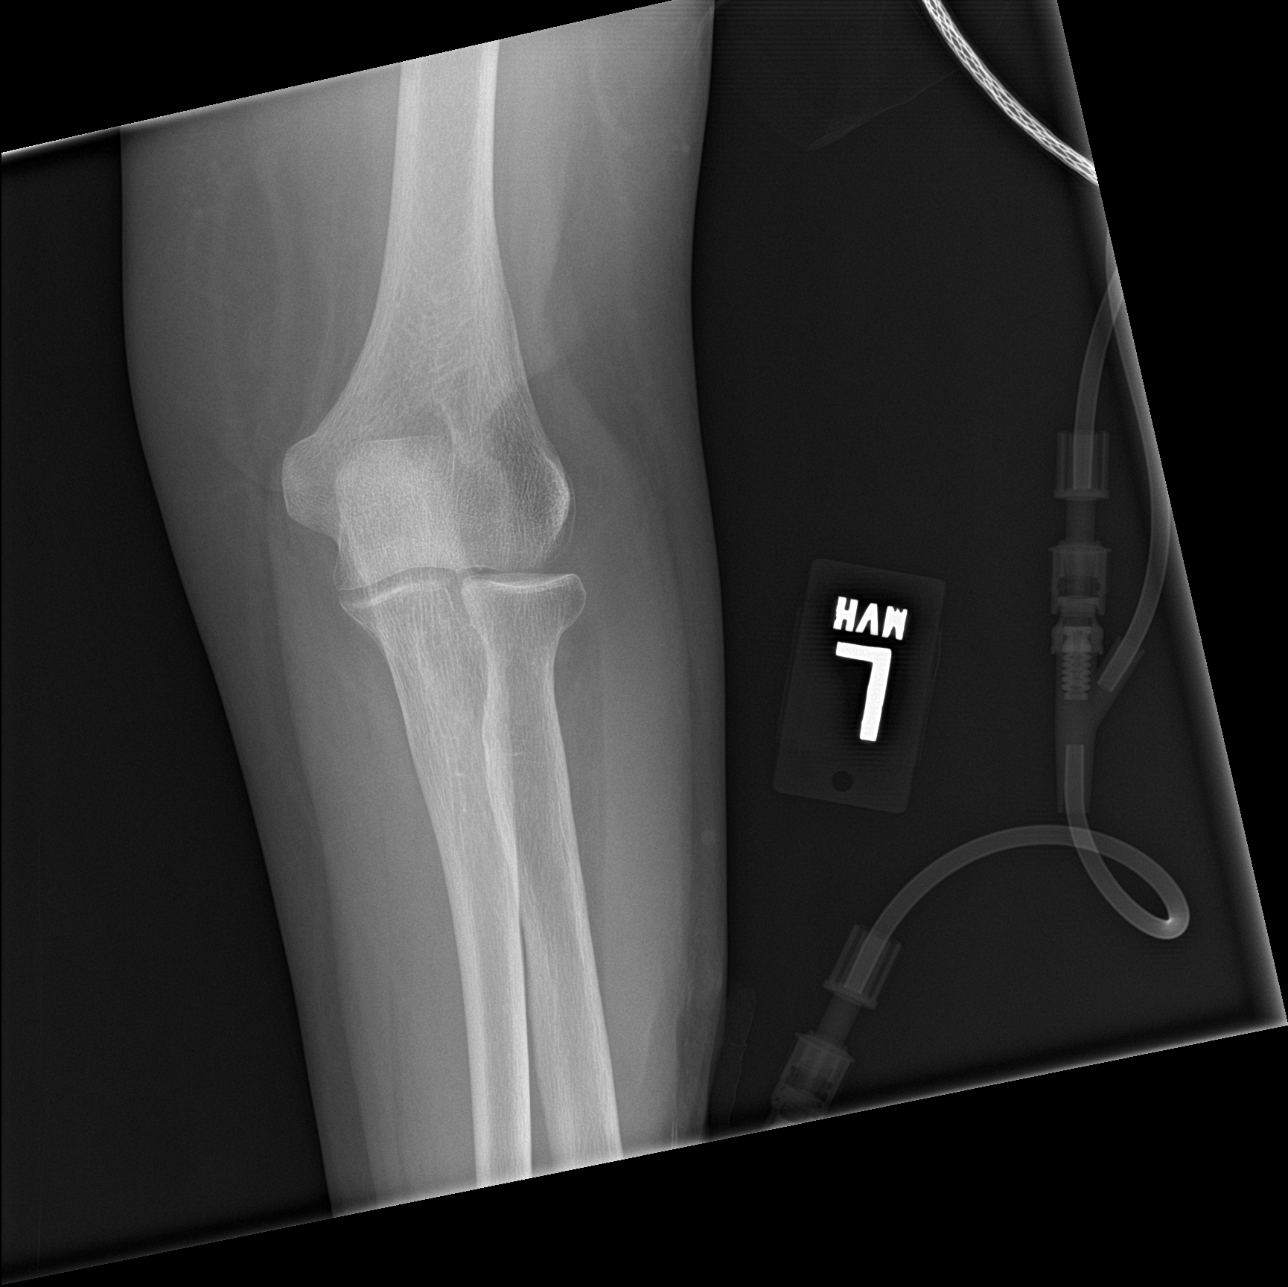

[elbow ap (3 of 3)]
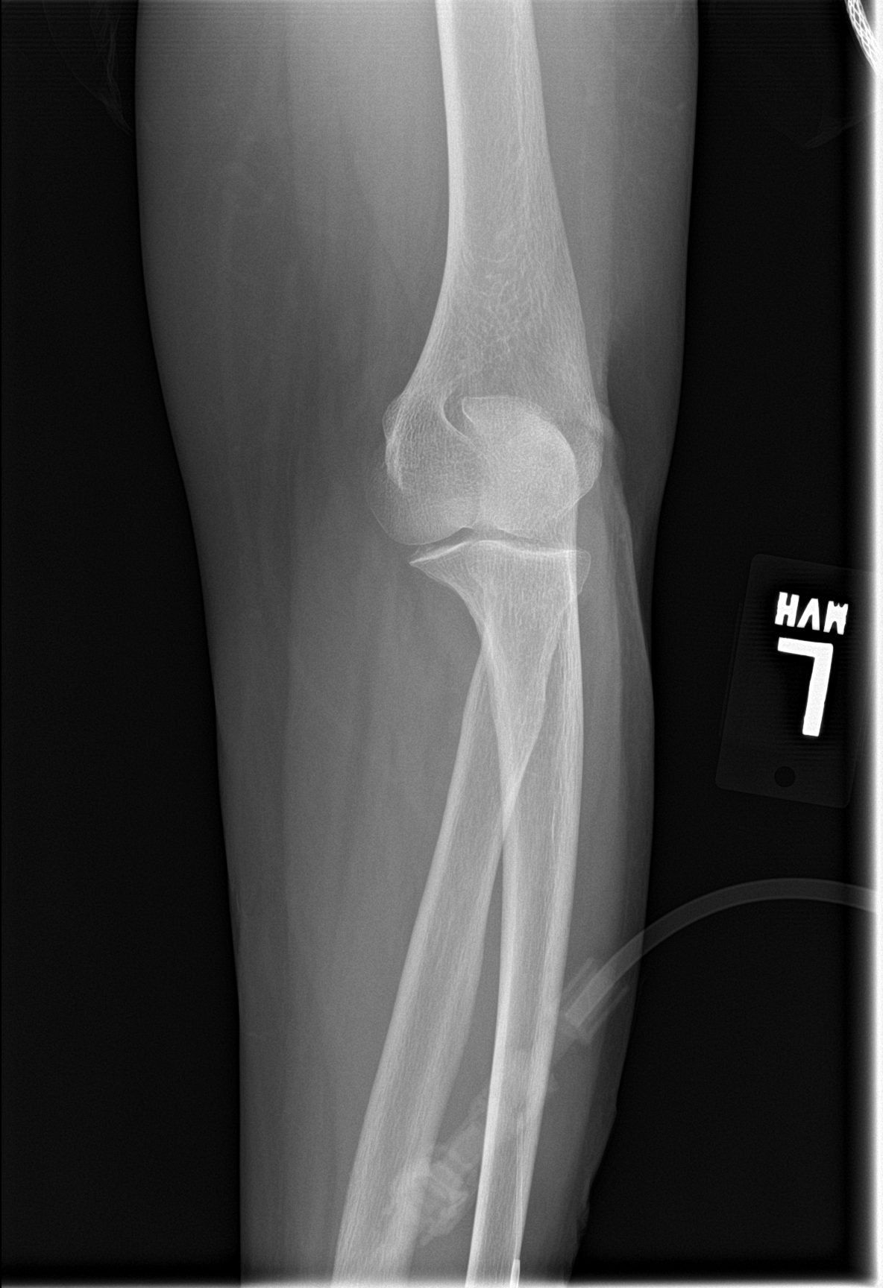

[elbow lat]
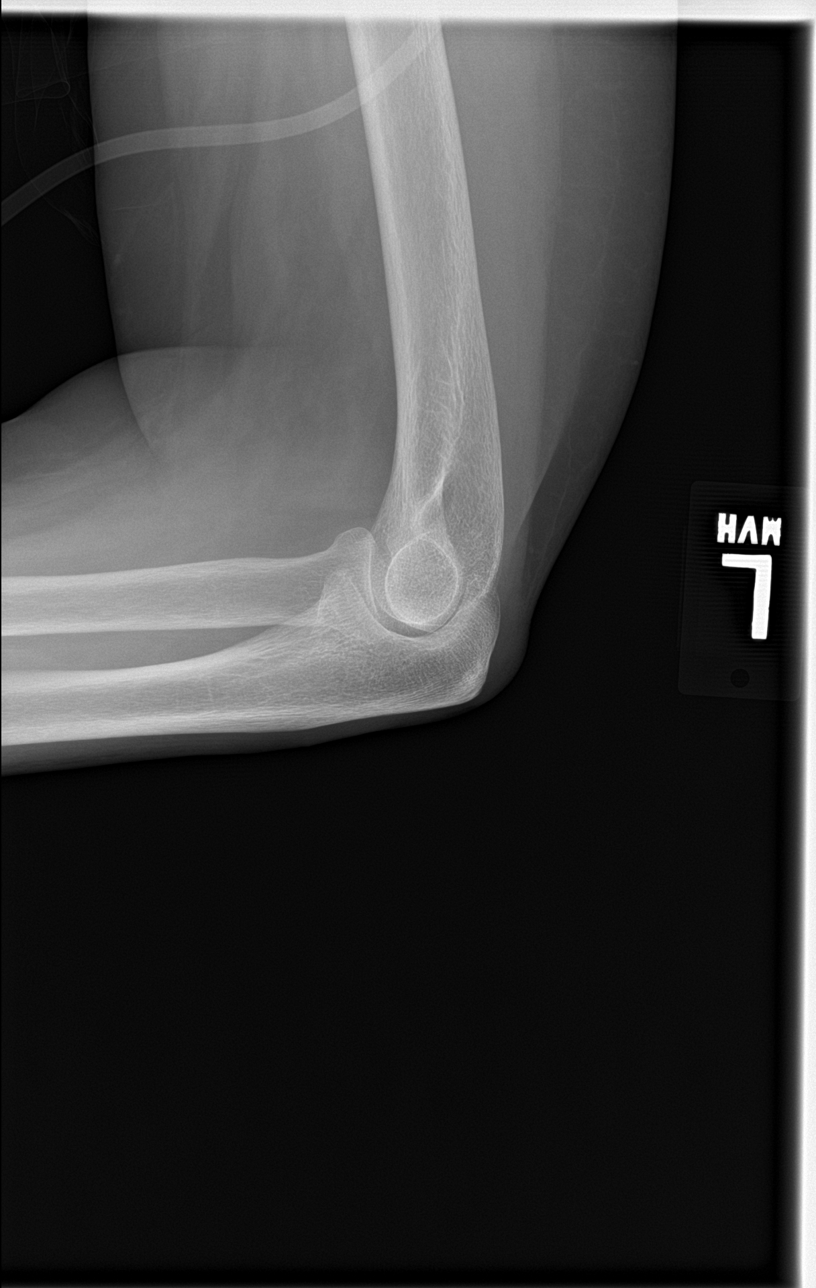

[4 of 4 positions shown; findings below may reference images not displayed]

FINDINGS: There is no evidence of fracture, dislocation, or joint effusion.
There is no evidence of arthropathy or other focal bone abnormality.
Soft tissues are unremarkable.
IMPRESSION: Negative.

## 2020-07-06 DIAGNOSIS — E872 Acidosis, unspecified: Secondary | ICD-10-CM | POA: Insufficient documentation

## 2020-07-15 ENCOUNTER — Other Ambulatory Visit: Payer: Self-pay | Admitting: Physician Assistant

## 2020-07-18 ENCOUNTER — Telehealth: Payer: Self-pay | Admitting: Emergency Medicine

## 2020-07-18 MED ORDER — DOXYCYCLINE HYCLATE 100 MG PO TABS: 100.0000 mg | ORAL_TABLET | Freq: Two times a day (BID) | ORAL | 0 refills | Status: DC

## 2020-07-18 MED ORDER — PREDNISONE 10 MG PO TABS: ORAL_TABLET | ORAL | 0 refills | Status: DC

## 2020-07-18 NOTE — Telephone Encounter (Signed)
Spoke with the pt  She states cough with yellow sputum x 1 wk  She has had some chest tightness and minimal increased SOB  No f/c/s  She has been fully vaccinated  She states that she has been using her proair 3 x daily on average  She has no transportation to come in for appt this wk- has to be at least 3 days in advance  Please advise recs thanks

## 2020-07-18 NOTE — Telephone Encounter (Signed)
Called and spoke with pt letting her know the info stated by RB and she verbalized understanding. Verified pt's preferred pharmacy and sent both meds to pharmacy for pt. Also scheduled pt a f/u appt.nothing further needed.

## 2020-07-18 NOTE — Telephone Encounter (Signed)
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 Also she needs an OV, hasn't been seen for a year  prednisone >> Take 40mg  daily for 3 days, then 30mg  daily for 3 days, then 20mg  daily for 3 days, then 10mg  daily for 3 days, then stop  Doxycycline 100mg  bid x 7 days

## 2020-07-25 ENCOUNTER — Ambulatory Visit: Payer: Medicare Other | Admitting: Primary Care

## 2020-08-01 ENCOUNTER — Ambulatory Visit: Payer: Medicare Other | Admitting: Primary Care

## 2020-08-03 ENCOUNTER — Other Ambulatory Visit: Payer: Self-pay

## 2020-08-03 ENCOUNTER — Encounter: Payer: Self-pay | Admitting: Primary Care

## 2020-08-03 ENCOUNTER — Ambulatory Visit (INDEPENDENT_AMBULATORY_CARE_PROVIDER_SITE_OTHER): Payer: Medicare Other | Admitting: Primary Care

## 2020-08-03 DIAGNOSIS — J449 Chronic obstructive pulmonary disease, unspecified: Secondary | ICD-10-CM

## 2020-08-03 DIAGNOSIS — R911 Solitary pulmonary nodule: Secondary | ICD-10-CM | POA: Insufficient documentation

## 2020-08-03 MED ORDER — SPIRIVA RESPIMAT 2.5 MCG/ACT IN AERS: 2.0000 | INHALATION_SPRAY | Freq: Every day | RESPIRATORY_TRACT | 0 refills | Status: DC

## 2020-08-03 MED ORDER — DOXYCYCLINE HYCLATE 100 MG PO TABS: 100.0000 mg | ORAL_TABLET | Freq: Two times a day (BID) | ORAL | 0 refills | Status: DC

## 2020-08-03 NOTE — Assessment & Plan Note (Addendum)
-   Based on PFTs in 2018, mild obstruction in ratio and F/V loop. Mild centrilobular and paraseptal emphysematous changes on imaging.  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 Plan extend doxycycline additional 5 days. She has been using SABA more frequently over the last several months as well. Given sample of Spiriva respimat 2.68mcg, if noticeable benefit will send in prescription to continue. Patient started smoking again in April 2020, 2 cigarettes a day. Encouraged her to pick quit date- she is motivated to quit smoking.

## 2020-08-03 NOTE — Patient Instructions (Addendum)
Nice meeting you today Ms. Klutts  Findings: - CT chest in March 2021 that showed mild  Centrilobular and paraseptal emphysematous changes, 3 mm nodule along right minor fissure, considered benign.  No suspicious pulmonary nodules.  Recommendations - Trial Spiriva Respimat-take 2 puffs once daily in the morning (if this helps breathing let us know and we will send in prescription) - Continue to use albuterol 2 puffs every 4-6 hours as needed for breakthrough shortness of breath or wheezing - We will extend course of doxycycline for acute bronchitis symptoms. If no improvement pleaser let us know   Follow-up - 6 months with Dr. Lamonte Sakai or sooner if needed

## 2020-08-03 NOTE — Assessment & Plan Note (Signed)
-   CT chest in March 2021 that showed stable 3 mm nodule along right minor fissure considered benign.  No suspicious pulmonary nodules. No further follow-up needed.

## 2020-08-03 NOTE — Progress Notes (Signed)
$'@Patient'g$  ID: Alejandra Hernandez, female    DOB: February 23, 1956, 64 y.o.   MRN: [id number]  Chief Complaint  Patient presents with  . Follow-up    Pt states she is feeling better since last visit. States she is stil coughing up yellow phlegm and has an occassional wheeze.    Referring provider: Antonietta Jewel, MD  HPI: 64 year old female, former smoker quit in 2019.  Past medical history significant for mild COPD, coronary artery disease, hypertension, chronic diastolic heart failure, bilateral DVT, GERD, uncontrolled type 2 diabetes, chronic kidney disease stage III.  Patient of Dr. Lamonte Sakai, last seen on 05/26/2019 for virtual telephone visit.    Patient originally seen by Dr. Lamonte Sakai for evaluation of hemoptysis, possibly from [place].  CT chest showed bilateral mediastinal lymphadenopathy and some right upper lobe bronchiectasis scarring.  Patient underwent biopsy of right perihilar abnormality in February 2018, no evidence of malignancy.  Follow-up CT imaging in March 2020 showed no mediastinal or hilar adenopathy, stable pulmonary nodules along right minor fissure.  Pulmonary function testing in 2018 showed mild obstruction based on ratio and flow volume loop.  Consider repeat pulmonary function testing if clinical symptoms change. She maintained on as needed albuterol.   08/03/2020- interim hx Patient presents today for annual follow-up for COPD.  Patient had final repeat CT chest in March 2021 that showed mild  Centrilobular and paraseptal emphysematous changes, 3 mm nodule along right minor fissure, considered benign.  No suspicious pulmonary nodules. Patient called office on 07/18/20 with reports of cough x 1 week with associated chest tightness and slight increase in shortness of breath. She was given course of doxycycline and prednisone. She continues to have productive cough with yellow thick mucus. She feels a good deal better but cough has not completely resolved. She is asking to have antibiotic  extended. Increase SABA use over the last several months. She is using albuteorl 2-3 times a day. She started smoking again in April 2020 approx 2 cigarettes a day.  Encourage patient pick quit date.   Imaging: CT chest showed bilateral mediastinal lymphadenopathy and some right upper lobe bronchiectasis scarring.  P  CT imaging in March 2020 showed no mediastinal or hilar adenopathy, stable pulmonary nodules along right minor fissure  CT chest 01/2020- CT chest in March 2021 that showed mild  Centrilobular and paraseptal emphysematous changes, 3 mm nodule along right minor fissure, considered benign.  No suspicious pulmonary nodules.  Allergies  Allergen Reactions  . Lisinopril Hives    Immunization History  Administered Date(s) Administered  . Influenza,inj,Quad PF,6+ Mos 09/15/2017, 10/04/2018  . Influenza-Unspecified 10/08/2016, 09/15/2017, 10/04/2018  . Zoster Recombinat (Shingrix) 10/04/2018, 12/09/2018    Past Medical History:  Diagnosis Date  . Anemia   . Anxiety   . Arthritis    "right knee, back, feet, hands" (10/20/2018)  . Bleeding stomach ulcer 01/2016  . CAD (coronary artery disease)    05/19/17 PCI with DESx1 to [facility]  . Carotid artery disease (HCC)    40-59% right and 1-39% left by doppler 05/2018 with stenotic right subclavian artery  . Chronic lower back pain   . CKD (chronic kidney disease) stage 3, GFR 30-59 ml/min 01/2016  . Depression   . Diabetic peripheral neuropathy ([place])   . DVT (deep venous thrombosis) ([place]) 12/2016   BLE  . GERD (gastroesophageal reflux disease)   . GI bleed   . Gout   . Headache    "weekly" (11/27//2019)  . History of blood transfusion 2017   "  related to stomach bleeding"  . Hyperlipidemia   . Hypertension   . NSTEMI (non-ST elevated myocardial infarction) ([place]) 05/2017  . PAD (peripheral artery disease) ([place])    a. LE stenting in 2017, 12/2017, 09/2018 - Dr. Fletcher Anon  . Pneumonia    "several times" (10/20/2018)  . Renal artery  stenosis (HCC)    a.  mild to moderate RAS by aortogram in 2017 (no evidence of this on duplex 06/2018).  . Stenosis of right subclavian artery ([place])   . Type II diabetes mellitus (HCC)     Tobacco History: Social History   Tobacco Use  Smoking Status Current Every Day Smoker  . Packs/day: 1.00  . Years: 43.00  . Pack years: 43.00  . Types: Cigarettes, E-cigarettes  . Last attempt to quit: 11/24/2017  . Years since quitting: 2.6  Smokeless Tobacco Never Used  Tobacco Comment   started smoking 02/2019 smoking 2cigs per day as of 08/03/20   Ready to quit: Not Answered Counseling given: Not Answered Comment: started smoking 02/2019 smoking 2cigs per day as of 08/03/20   Outpatient Medications Prior to Visit  Medication Sig Dispense Refill  . acetaminophen (TYLENOL) 325 MG tablet Take 650 mg by mouth 2 (two) times a day.     . albuterol (PROAIR HFA) 108 (90 Base) MCG/ACT inhaler Inhale 2 puffs into the lungs every 4 (four) hours as needed for wheezing or shortness of breath. 1 Inhaler 5  . amitriptyline (ELAVIL) 25 MG tablet Take 75 mg by mouth at bedtime.     Marland Kitchen amLODipine (NORVASC) 10 MG tablet Take 10 mg by mouth at bedtime.    Marland Kitchen aspirin 81 MG chewable tablet Chew 1 tablet (81 mg total) by mouth daily. 30 tablet 3  . Blood Glucose Monitoring Suppl (ACCU-CHEK GUIDE ME) w/Device KIT 4 (four) times daily. for testing    . Calcium Carbonate Antacid (TUMS PO) Take 2 tablets by mouth 3 (three) times daily as needed (acid indigestion).     . cloNIDine (CATAPRES) 0.1 MG tablet Take 0.1 mg by mouth 2 (two) times daily.    . Continuous Blood Gluc Receiver (FREESTYLE LIBRE 14 DAY READER) DEVI Scan as needed.    . cyclobenzaprine (FLEXERIL) 10 MG tablet Take 1 tablet (10 mg total) by mouth 2 (two) times daily as needed for muscle spasms. 20 tablet 0  . diclofenac Sodium (VOLTAREN) 1 % GEL Apply to feet for pain as needed (Patient taking differently: Apply 1 application topically daily as needed  (pain). Apply to feet for pain as needed) 150 g 2  . ezetimibe (ZETIA) 10 MG tablet TAKE 1 TABLET(10 MG) BY MOUTH DAILY 90 tablet 2  . ferrous sulfate 325 (65 FE) MG tablet Take 325 mg by mouth daily with breakfast.     . furosemide (LASIX) 20 MG tablet TAKE 1 TABLET(20 MG) BY MOUTH EVERY MORNING (Patient taking differently: Take 20 mg by mouth daily. ) 30 tablet 5  . gabapentin (NEURONTIN) 600 MG tablet Take 0.5 tablets (300 mg total) by mouth 3 (three) times daily. 90 tablet 2  . glucose blood (ACCU-CHEK AVIVA PLUS) test strip 1 each by Other route 2 (two) times daily. And lancets 2/day 200 each 3  . hydroxypropyl methylcellulose / hypromellose (ISOPTO TEARS / GONIOVISC) 2.5 % ophthalmic solution Place 1 drop into both eyes 3 (three) times daily as needed for dry eyes.    . Insulin Degludec (TRESIBA FLEXTOUCH) 200 UNIT/ML SOPN Inject 68 Units into the skin daily. And  pen needles 1/day (Patient taking differently: Inject 10 Units into the skin every morning. And pen needles 1/day) 9 pen 11  . insulin lispro (HUMALOG) 100 UNIT/ML injection Inject 1-10 Units into the skin 3 (three) times daily before meals. Per sliding scale    . isosorbide mononitrate (IMDUR) 30 MG 24 hr tablet TAKE 3 TABLETS(90 MG) BY MOUTH DAILY 270 tablet 2  . lidocaine-prilocaine (EMLA) cream Apply 1 application topically See admin instructions. Apply 2-3 grams (1 gram = 1 inch) to back 4 times daily for pain    . nitroGLYCERIN (NITROSTAT) 0.4 MG SL tablet PLACE 1 TABLET UNDER THE TONGUE EVERY 5 MINUTES AS NEEDED FOR CHEST PAIN 25 tablet 7  . NON FORMULARY Accu-Chek Aviva Plus test strips  USE FOUR TIMES DAILY    . oxyCODONE-acetaminophen (PERCOCET) 10-325 MG tablet Take 1 tablet by mouth 4 (four) times daily as needed for pain.   0  . pantoprazole (PROTONIX) 40 MG tablet TAKE 1 TABLET BY MOUTH TWICE DAILY BEFORE A MEAL (Patient taking differently: Take 40 mg by mouth 2 (two) times daily. ) 180 tablet 0  . polyethylene glycol  powder (MIRALAX) 17 GM/SCOOP powder Start taking 1 capful 3 times a day. Slowly cut back as needed until you have normal bowel movements. 255 g 0  . potassium chloride SA (KLOR-CON) 20 MEQ tablet TAKE 1/2 TABLET BY MOUTH EVERY DAY 90 tablet 1  . PRESCRIPTION MEDICATION APPLY 1 APPLICATION TOPICALLY  Cream compounded at [facility] - B5, D3 G10, K10 Pent+ - apply 1 gram (1 gram = 1 pump) to affected area 4 times daily as needed for pain    . rivaroxaban (XARELTO) 20 MG TABS tablet Take 1 tablet (20 mg total) by mouth daily with supper. 30 tablet 11  . rosuvastatin (CRESTOR) 40 MG tablet Take 40 mg by mouth daily at 6 PM.    . Vitamin D, Ergocalciferol, (DRISDOL) 50000 units CAPS capsule Take 50,000 Units by mouth every Wednesday.   10  . rosuvastatin (CRESTOR) 40 MG tablet Take 1 tablet (40 mg total) by mouth daily at 6 PM. 90 tablet 0  . doxycycline (VIBRA-TABS) 100 MG tablet Take 1 tablet (100 mg total) by mouth 2 (two) times daily. 14 tablet 0  . predniSONE (DELTASONE) 10 MG tablet Take $RemoveBef'40mg'RvsCUbOgeM$  for 3 days, then $RemoveBe'30mg'eweWHazlR$  for 3 days, $Remov'20mg'HApGcT$  for 3 days, $Remov'10mg'ZcGRrQ$  for 3 days, then stop 30 tablet 0   No facility-administered medications prior to visit.   Review of Systems  Review of Systems  Constitutional: Negative.   Respiratory: Positive for cough, shortness of breath and wheezing.   Cardiovascular: Negative.    Physical Exam  BP 128/64 (BP Location: Right Arm, Cuff Size: Normal)   Pulse 72   Temp 97.8 F (36.6 C) (Other (Comment)) Comment (Src): wrist  Ht $R'5\' 3"'qU$  (1.6 m)   Wt 151 lb 9.6 oz (68.8 kg)   LMP  (LMP Unknown)   SpO2 100%   BMI 26.85 kg/m  Physical Exam Constitutional:      Appearance: Normal appearance.  HENT:     Head: Normocephalic and atraumatic.     Mouth/Throat:     Mouth: Mucous membranes are moist.     Pharynx: Oropharynx is clear.     Comments: Slight yellow/brown film posterior tongue Cardiovascular:     Rate and Rhythm: Normal rate and regular rhythm.  Pulmonary:      Effort: Pulmonary effort is normal.     Breath sounds: Normal  breath sounds. No wheezing or rhonchi.  Skin:    General: Skin is warm and dry.  Neurological:     General: No focal deficit present.     Mental Status: She is alert and oriented to person, place, and time. Mental status is at baseline.  Psychiatric:        Mood and Affect: Mood normal.        Behavior: Behavior normal.        Thought Content: Thought content normal.        Judgment: Judgment normal.      Lab Results:  CBC    Component Value Date/Time   WBC 7.2 02/19/2020 1930   RBC 5.07 02/19/2020 1930   HGB 10.8 (L) 02/19/2020 1930   HGB 9.5 (L) 02/01/2019 1315   HGB 10.7 (L) 12/14/2018 0935   HCT 35.6 (L) 02/19/2020 1930   HCT 36.4 12/14/2018 0935   PLT 356 02/19/2020 1930   PLT 349 02/01/2019 1315   PLT 333 12/14/2018 0935   MCV 70.2 (L) 02/19/2020 1930   MCV 72 (L) 12/14/2018 0935   MCH 21.3 (L) 02/19/2020 1930   MCHC 30.3 02/19/2020 1930   RDW 16.7 (H) 02/19/2020 1930   RDW 17.2 (H) 12/14/2018 0935   LYMPHSABS 3.6 08/02/2019 2240   LYMPHSABS 3.1 10/19/2018 1021   MONOABS 0.6 08/02/2019 2240   EOSABS 0.1 08/02/2019 2240   EOSABS 0.2 10/19/2018 1021   BASOSABS 0.0 08/02/2019 2240   BASOSABS 0.1 10/19/2018 1021    BMET    Component Value Date/Time   NA 136 02/19/2020 1930   NA 134 12/14/2018 0935   K 4.3 02/19/2020 1930   CL 105 02/19/2020 1930   CO2 20 (L) 02/19/2020 1930   GLUCOSE 170 (H) 02/19/2020 1930   BUN 18 02/19/2020 1930   BUN 15 12/14/2018 0935   CREATININE 1.36 (H) 02/19/2020 1930   CREATININE 1.27 (H) 05/22/2016 1424   CALCIUM 9.1 02/19/2020 1930   GFRNONAA 41 (L) 02/19/2020 1930   GFRNONAA 43 (L) 04/23/2016 0939   GFRAA 48 (L) 02/19/2020 1930   GFRAA 50 (L) 04/23/2016 0939    BNP    Component Value Date/Time   BNP 74.3 07/10/2019 0049   BNP 16.9 05/08/2016 1320    ProBNP    Component Value Date/Time   PROBNP 299 (H) 12/14/2018 0935    Imaging: No results  found.   Assessment & Plan:   COPD, mild ([place]) - Based on PFTs in 2018, mild obstruction in ratio and F/V loop. Mild centrilobular and paraseptal emphysematous changes on imaging.  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 Plan extend doxycycline additional 5 days. She has been using SABA more frequently over the last several months as well. Given sample of Spiriva respimat 2.90mcg, if noticeable benefit will send in prescription to continue. Patient started smoking again in April 2020, 2 cigarettes a day. Encouraged her to pick quit date- she is motivated to quit smoking.   Pulmonary nodule - CT chest in March 2021 that showed stable 3 mm nodule along right minor fissure considered benign.  No suspicious pulmonary nodules. No further follow-up needed.    Martyn Ehrich, NP 08/03/2020

## 2020-08-16 ENCOUNTER — Other Ambulatory Visit: Payer: Self-pay

## 2020-08-16 ENCOUNTER — Encounter: Payer: Self-pay | Admitting: Sports Medicine

## 2020-08-16 ENCOUNTER — Ambulatory Visit (INDEPENDENT_AMBULATORY_CARE_PROVIDER_SITE_OTHER): Payer: Medicare Other | Admitting: Sports Medicine

## 2020-08-16 DIAGNOSIS — I739 Peripheral vascular disease, unspecified: Secondary | ICD-10-CM

## 2020-08-16 DIAGNOSIS — M79675 Pain in left toe(s): Secondary | ICD-10-CM

## 2020-08-16 DIAGNOSIS — IMO0002 Reserved for concepts with insufficient information to code with codable children: Secondary | ICD-10-CM

## 2020-08-16 DIAGNOSIS — E1165 Type 2 diabetes mellitus with hyperglycemia: Secondary | ICD-10-CM

## 2020-08-16 DIAGNOSIS — E1140 Type 2 diabetes mellitus with diabetic neuropathy, unspecified: Secondary | ICD-10-CM

## 2020-08-16 DIAGNOSIS — B351 Tinea unguium: Secondary | ICD-10-CM | POA: Diagnosis not present

## 2020-08-16 DIAGNOSIS — M79674 Pain in right toe(s): Secondary | ICD-10-CM

## 2020-08-16 MED ORDER — DICLOFENAC SODIUM 1 % EX GEL: 1.0000 "application " | Freq: Every day | CUTANEOUS | 2 refills | Status: DC | PRN

## 2020-08-16 NOTE — Progress Notes (Signed)
Subjective: Alejandra Hernandez is a 64 y.o. female patient with history of diabetes who presents to office today complaining of long,mildly painful nails  while ambulating in shoes; unable to trim. Patient states that the glucose reading this morning was not recorded but ranges 200. No other issues.   Patient Active Problem List   Diagnosis Date Noted  . Pulmonary nodule 08/03/2020  . Complaints of total body pain 07/10/2019  . Left upper extremity swelling 06/03/2019  . COPD, mild ([place]) 05/26/2019  . Leukocytosis 04/14/2019  . Avascular necrosis of humeral head, left ([place]) 04/14/2019  . Hypokalemia 04/14/2019  . Neuropathy 04/14/2019  . DKA (diabetic ketoacidoses) ([place]) 04/13/2019  . Polyp of transverse colon   . Atypical chest pain 11/16/2018  . Uncontrolled insulin dependent diabetes mellitus 05/23/2018  . CKD (chronic kidney disease), stage III 05/14/2018  . Hyperglycemia 12/23/2017  . Low TSH level 06/22/2017  . CAD (coronary artery disease)   . Nodule on liver 05/22/2017  . Heart palpitations 05/14/2017  . Unspecified skin changes 04/18/2017  . History of tobacco use 03/17/2017  . Benign neoplasm of descending colon   . Benign neoplasm of sigmoid colon   . Chronic diastolic CHF (congestive heart failure) ([place]) 02/09/2017  . History of DVT (deep vein thrombosis)   . Abnormal CT scan, chest   . Acute urinary retention   . Claudication ([place]) 07/23/2016  . PAD (peripheral artery disease) ([place])   . Essential hypertension 05/14/2016  . Uncontrolled type 2 diabetes mellitus with ketoacidosis without coma, with long-term current use of insulin ([place])   . GERD (gastroesophageal reflux disease) 05/17/2014  . Carotid artery disease ([place]) 05/17/2014  . Iron deficiency anemia 04/26/2014  . Dyspnea 04/25/2014  . Carotid artery bruit 04/25/2014  . Hyperlipidemia with target low density lipoprotein (LDL) cholesterol less than 70 mg/dL   . Gout    Current Outpatient Medications on File  Prior to Visit  Medication Sig Dispense Refill  . acetaminophen (TYLENOL) 325 MG tablet Take 650 mg by mouth 2 (two) times a day.     . albuterol (PROAIR HFA) 108 (90 Base) MCG/ACT inhaler Inhale 2 puffs into the lungs every 4 (four) hours as needed for wheezing or shortness of breath. 1 Inhaler 5  . amitriptyline (ELAVIL) 25 MG tablet Take 75 mg by mouth at bedtime.     Marland Kitchen amLODipine (NORVASC) 10 MG tablet Take 10 mg by mouth at bedtime.    Marland Kitchen aspirin 81 MG chewable tablet Chew 1 tablet (81 mg total) by mouth daily. 30 tablet 3  . Blood Glucose Monitoring Suppl (ACCU-CHEK GUIDE ME) w/Device KIT 4 (four) times daily. for testing    . Calcium Carbonate Antacid (TUMS PO) Take 2 tablets by mouth 3 (three) times daily as needed (acid indigestion).     . cloNIDine (CATAPRES) 0.1 MG tablet Take 0.1 mg by mouth 2 (two) times daily.    . Continuous Blood Gluc Receiver (FREESTYLE LIBRE 14 DAY READER) DEVI Scan as needed.    . cyclobenzaprine (FLEXERIL) 10 MG tablet Take 1 tablet (10 mg total) by mouth 2 (two) times daily as needed for muscle spasms. 20 tablet 0  . diclofenac Sodium (VOLTAREN) 1 % GEL Apply to feet for pain as needed (Patient taking differently: Apply 1 application topically daily as needed (pain). Apply to feet for pain as needed) 150 g 2  . doxycycline (VIBRA-TABS) 100 MG tablet Take 1 tablet (100 mg total) by mouth 2 (two) times daily. 10 tablet  0  . ezetimibe (ZETIA) 10 MG tablet TAKE 1 TABLET(10 MG) BY MOUTH DAILY 90 tablet 2  . ferrous sulfate 325 (65 FE) MG tablet Take 325 mg by mouth daily with breakfast.     . furosemide (LASIX) 20 MG tablet TAKE 1 TABLET(20 MG) BY MOUTH EVERY MORNING (Patient taking differently: Take 20 mg by mouth daily. ) 30 tablet 5  . gabapentin (NEURONTIN) 600 MG tablet Take 0.5 tablets (300 mg total) by mouth 3 (three) times daily. 90 tablet 2  . glucose blood (ACCU-CHEK AVIVA PLUS) test strip 1 each by Other route 2 (two) times daily. And lancets 2/day 200  each 3  . hydroxypropyl methylcellulose / hypromellose (ISOPTO TEARS / GONIOVISC) 2.5 % ophthalmic solution Place 1 drop into both eyes 3 (three) times daily as needed for dry eyes.    . Insulin Degludec (TRESIBA FLEXTOUCH) 200 UNIT/ML SOPN Inject 68 Units into the skin daily. And pen needles 1/day (Patient taking differently: Inject 10 Units into the skin every morning. And pen needles 1/day) 9 pen 11  . insulin lispro (HUMALOG) 100 UNIT/ML injection Inject 1-10 Units into the skin 3 (three) times daily before meals. Per sliding scale    . isosorbide mononitrate (IMDUR) 30 MG 24 hr tablet TAKE 3 TABLETS(90 MG) BY MOUTH DAILY 270 tablet 2  . lidocaine-prilocaine (EMLA) cream Apply 1 application topically See admin instructions. Apply 2-3 grams (1 gram = 1 inch) to back 4 times daily for pain    . nitroGLYCERIN (NITROSTAT) 0.4 MG SL tablet PLACE 1 TABLET UNDER THE TONGUE EVERY 5 MINUTES AS NEEDED FOR CHEST PAIN 25 tablet 7  . NON FORMULARY Accu-Chek Aviva Plus test strips  USE FOUR TIMES DAILY    . oxyCODONE-acetaminophen (PERCOCET) 10-325 MG tablet Take 1 tablet by mouth 4 (four) times daily as needed for pain.   0  . pantoprazole (PROTONIX) 40 MG tablet TAKE 1 TABLET BY MOUTH TWICE DAILY BEFORE A MEAL (Patient taking differently: Take 40 mg by mouth 2 (two) times daily. ) 180 tablet 0  . polyethylene glycol powder (MIRALAX) 17 GM/SCOOP powder Start taking 1 capful 3 times a day. Slowly cut back as needed until you have normal bowel movements. 255 g 0  . potassium chloride SA (KLOR-CON) 20 MEQ tablet TAKE 1/2 TABLET BY MOUTH EVERY DAY 90 tablet 1  . PRESCRIPTION MEDICATION APPLY 1 APPLICATION TOPICALLY  Cream compounded at [facility] - B5, D3 G10, K10 Pent+ - apply 1 gram (1 gram = 1 pump) to affected area 4 times daily as needed for pain    . rivaroxaban (XARELTO) 20 MG TABS tablet Take 1 tablet (20 mg total) by mouth daily with supper. 30 tablet 11  . rosuvastatin (CRESTOR) 40 MG tablet Take  1 tablet (40 mg total) by mouth daily at 6 PM. 90 tablet 0  . rosuvastatin (CRESTOR) 40 MG tablet Take 40 mg by mouth daily at 6 PM.    . Tiotropium Bromide Monohydrate (SPIRIVA RESPIMAT) 2.5 MCG/ACT AERS Inhale 2 puffs into the lungs daily. 4 g 0  . Vitamin D, Ergocalciferol, (DRISDOL) 50000 units CAPS capsule Take 50,000 Units by mouth every Wednesday.   10   No current facility-administered medications on file prior to visit.   Allergies  Allergen Reactions  . Lisinopril Hives      Objective: General: Patient is awake, alert, and oriented x 3 and in no acute distress.  Integument: Skin is warm, dry and supple bilateral. Nails are tender,  long, thickened and dystrophic with subungual debris, consistent with onychomycosis, 1-5 bilateral. No signs of infection. Mild callus to bilateral hallux, remaining integument unremarkable.  Vasculature:  Dorsalis Pedis pulse 1/4 bilateral. Posterior Tibial pulse  1/4 bilateral. Capillary fill time <3 sec 1-5 bilateral. Positive hair growth to the level of the digits.Temperature gradient within normal limits.   Neurology: The patient has diminished sensation measured with a 5.07/10g Semmes Weinstein Monofilament at all pedal sites bilateral. Vibratory sensation diminished bilateral with tuning fork. No Babinski sign present bilateral.   Musculoskeletal: Asymptomatic hallux limitus, pes planus, hammertoe pedal deformities noted bilateral. Muscular strength 4/5 in all lower extremity muscular groups bilateral without pain on range of motion . No tenderness with calf compression bilateral.  Assessment and Plan: Problem List Items Addressed This Visit      Cardiovascular and Mediastinum   PAD (peripheral artery disease) ([place])    Other Visit Diagnoses    Pain due to onychomycosis of toenails of both feet    -  Primary   Type 2 diabetes, uncontrolled, with neuropathy ([place])         -Examined patient. -Re-Discussed and educated patient on diabetic  foot care  -Mechanically debrided all nails 1-5 bilateral using sterile nail nipper and filed with dremel without incident  -Debrided callus to bilateral first toes at no additional charge using a sterile chisel blade without incident -Patient to return  in 3 months for at risk foot nail care and callus care -Patient advised to call the office if any problems or questions arise in the meantime.  Landis Martins, DPM

## 2020-08-27 DIAGNOSIS — R9389 Abnormal findings on diagnostic imaging of other specified body structures: Secondary | ICD-10-CM | POA: Insufficient documentation

## 2020-08-27 DIAGNOSIS — Z86718 Personal history of other venous thrombosis and embolism: Secondary | ICD-10-CM | POA: Insufficient documentation

## 2020-08-29 ENCOUNTER — Telehealth (HOSPITAL_COMMUNITY): Payer: Self-pay | Admitting: Emergency Medicine

## 2020-08-29 ENCOUNTER — Ambulatory Visit (HOSPITAL_COMMUNITY)
Admission: EM | Admit: 2020-08-29 | Discharge: 2020-08-29 | Disposition: A | Discharge status: Home / Self Care | Payer: Medicare Other | Attending: Internal Medicine | Admitting: Internal Medicine

## 2020-08-29 ENCOUNTER — Other Ambulatory Visit: Payer: Self-pay

## 2020-08-29 ENCOUNTER — Encounter (HOSPITAL_COMMUNITY): Payer: Self-pay

## 2020-08-29 DIAGNOSIS — I5032 Chronic diastolic (congestive) heart failure: Secondary | ICD-10-CM | POA: Diagnosis not present

## 2020-08-29 DIAGNOSIS — I2510 Atherosclerotic heart disease of native coronary artery without angina pectoris: Secondary | ICD-10-CM | POA: Insufficient documentation

## 2020-08-29 DIAGNOSIS — Z86718 Personal history of other venous thrombosis and embolism: Secondary | ICD-10-CM | POA: Insufficient documentation

## 2020-08-29 DIAGNOSIS — I739 Peripheral vascular disease, unspecified: Secondary | ICD-10-CM | POA: Insufficient documentation

## 2020-08-29 DIAGNOSIS — F17210 Nicotine dependence, cigarettes, uncomplicated: Secondary | ICD-10-CM | POA: Diagnosis not present

## 2020-08-29 DIAGNOSIS — Z79899 Other long term (current) drug therapy: Secondary | ICD-10-CM | POA: Insufficient documentation

## 2020-08-29 DIAGNOSIS — Z794 Long term (current) use of insulin: Secondary | ICD-10-CM | POA: Insufficient documentation

## 2020-08-29 DIAGNOSIS — E785 Hyperlipidemia, unspecified: Secondary | ICD-10-CM | POA: Insufficient documentation

## 2020-08-29 DIAGNOSIS — Z20822 Contact with and (suspected) exposure to covid-19: Secondary | ICD-10-CM | POA: Diagnosis not present

## 2020-08-29 DIAGNOSIS — N183 Chronic kidney disease, stage 3 unspecified: Secondary | ICD-10-CM | POA: Diagnosis not present

## 2020-08-29 DIAGNOSIS — E1122 Type 2 diabetes mellitus with diabetic chronic kidney disease: Secondary | ICD-10-CM | POA: Diagnosis not present

## 2020-08-29 DIAGNOSIS — R519 Headache, unspecified: Secondary | ICD-10-CM | POA: Insufficient documentation

## 2020-08-29 DIAGNOSIS — K219 Gastro-esophageal reflux disease without esophagitis: Secondary | ICD-10-CM | POA: Diagnosis not present

## 2020-08-29 DIAGNOSIS — I779 Disorder of arteries and arterioles, unspecified: Secondary | ICD-10-CM | POA: Diagnosis not present

## 2020-08-29 DIAGNOSIS — J441 Chronic obstructive pulmonary disease with (acute) exacerbation: Secondary | ICD-10-CM | POA: Diagnosis not present

## 2020-08-29 DIAGNOSIS — E1142 Type 2 diabetes mellitus with diabetic polyneuropathy: Secondary | ICD-10-CM | POA: Diagnosis not present

## 2020-08-29 DIAGNOSIS — I252 Old myocardial infarction: Secondary | ICD-10-CM | POA: Diagnosis not present

## 2020-08-29 DIAGNOSIS — Z7982 Long term (current) use of aspirin: Secondary | ICD-10-CM | POA: Diagnosis not present

## 2020-08-29 DIAGNOSIS — Z7901 Long term (current) use of anticoagulants: Secondary | ICD-10-CM | POA: Insufficient documentation

## 2020-08-29 DIAGNOSIS — I130 Hypertensive heart and chronic kidney disease with heart failure and stage 1 through stage 4 chronic kidney disease, or unspecified chronic kidney disease: Secondary | ICD-10-CM | POA: Insufficient documentation

## 2020-08-29 DIAGNOSIS — R059 Cough, unspecified: Secondary | ICD-10-CM | POA: Diagnosis present

## 2020-08-29 MED ORDER — DOXYCYCLINE HYCLATE 100 MG PO CAPS: 100.0000 mg | ORAL_CAPSULE | Freq: Two times a day (BID) | ORAL | 0 refills | Status: DC

## 2020-08-29 MED ORDER — DOXYCYCLINE HYCLATE 100 MG PO CAPS: 100.0000 mg | ORAL_CAPSULE | Freq: Two times a day (BID) | ORAL | 0 refills | Status: AC
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
## 2020-08-29 NOTE — ED Triage Notes (Signed)
Pt presents with complaints of productive cough x 1 month and a head ache off and on for several months. Patient has completed a round of doxycycline given to her by her pulmonology. Pt finished antibiotics 3 weeks ago and is continuing to cough. Her doctor asked her to come in to be seen. Pt also feels like she has been straining to pee some. Pt blood sugar has been elevated at home 260ish. 275 in intake. Pt denies any other symptoms at this time.

## 2020-08-30 LAB — SARS CORONAVIRUS 2 (TAT 6-24 HRS): SARS Coronavirus 2: NEGATIVE

## 2020-09-04 ENCOUNTER — Telehealth: Payer: Self-pay | Admitting: Emergency Medicine

## 2020-09-04 MED ORDER — SPIRIVA RESPIMAT 2.5 MCG/ACT IN AERS: 2.0000 | INHALATION_SPRAY | Freq: Every day | RESPIRATORY_TRACT | 5 refills | Status: DC

## 2020-09-04 NOTE — Telephone Encounter (Signed)
Rx for spiriva has been sent to preferred pharmacy for pt. Attempted to call pt to let her know this had been done but unable to reach. Unable to leave a VM due to no VM kicking in.   Tried to call pt back and was able to reach her. Let pt know that the Rx for spiriva was sent to pharmacy for her and she verbalized understanding. Nothing further needed.

## 2020-11-15 ENCOUNTER — Ambulatory Visit: Payer: Medicare Other | Admitting: Sports Medicine

## 2020-12-04 ENCOUNTER — Other Ambulatory Visit: Payer: Self-pay

## 2020-12-04 ENCOUNTER — Ambulatory Visit (INDEPENDENT_AMBULATORY_CARE_PROVIDER_SITE_OTHER): Payer: Medicare Other | Admitting: Cardiovascular Disease

## 2020-12-04 ENCOUNTER — Encounter: Payer: Self-pay | Admitting: Cardiovascular Disease

## 2020-12-04 VITALS — BP 152/72 | HR 77 | Ht 63.0 in | Wt 146.2 lb

## 2020-12-04 DIAGNOSIS — E785 Hyperlipidemia, unspecified: Secondary | ICD-10-CM

## 2020-12-04 DIAGNOSIS — I2510 Atherosclerotic heart disease of native coronary artery without angina pectoris: Secondary | ICD-10-CM

## 2020-12-04 DIAGNOSIS — I779 Disorder of arteries and arterioles, unspecified: Secondary | ICD-10-CM | POA: Diagnosis not present

## 2020-12-04 DIAGNOSIS — I739 Peripheral vascular disease, unspecified: Secondary | ICD-10-CM

## 2020-12-04 NOTE — Patient Instructions (Signed)
  Testing/Procedures:  ABI, LEA AND ABDOMINAL DUPLEX IN JUNE   Follow-Up: At [facility], you and your health needs are our priority.  As part of our continuing mission to provide you with exceptional heart care, we have created designated Provider Care Teams.  These Care Teams include your primary Cardiologist (physician) and Advanced Practice Providers (APPs -  Physician Assistants and Nurse Practitioners) who all work together to provide you with the care you need, when you need it.  We recommend signing up for the patient portal called "MyChart".  Sign up information is provided on this After Visit Summary.  MyChart is used to connect with patients for Virtual Visits (Telemedicine).  Patients are able to view lab/test results, encounter notes, upcoming appointments, etc.  Non-urgent messages can be sent to your provider as well.   To learn more about what you can do with MyChart, go to [email or web address].    Your next appointment:   9 month(s)  The format for your next appointment:   In Person  Provider:   Kathlyn [place], MD
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
## 2020-12-17 ENCOUNTER — Other Ambulatory Visit: Payer: Self-pay | Admitting: Internal Medicine

## 2020-12-17 DIAGNOSIS — Z1231 Encounter for screening mammogram for malignant neoplasm of breast: Secondary | ICD-10-CM

## 2020-12-20 ENCOUNTER — Ambulatory Visit (INDEPENDENT_AMBULATORY_CARE_PROVIDER_SITE_OTHER): Payer: Medicare Other | Admitting: Sports Medicine

## 2020-12-20 ENCOUNTER — Other Ambulatory Visit: Payer: Self-pay

## 2020-12-20 ENCOUNTER — Encounter: Payer: Self-pay | Admitting: Sports Medicine

## 2020-12-20 DIAGNOSIS — E1140 Type 2 diabetes mellitus with diabetic neuropathy, unspecified: Secondary | ICD-10-CM

## 2020-12-20 DIAGNOSIS — M79675 Pain in left toe(s): Secondary | ICD-10-CM

## 2020-12-20 DIAGNOSIS — M79674 Pain in right toe(s): Secondary | ICD-10-CM

## 2020-12-20 DIAGNOSIS — B351 Tinea unguium: Secondary | ICD-10-CM | POA: Diagnosis not present

## 2020-12-20 DIAGNOSIS — I739 Peripheral vascular disease, unspecified: Secondary | ICD-10-CM

## 2020-12-20 DIAGNOSIS — IMO0002 Reserved for concepts with insufficient information to code with codable children: Secondary | ICD-10-CM

## 2020-12-20 DIAGNOSIS — E1165 Type 2 diabetes mellitus with hyperglycemia: Secondary | ICD-10-CM

## 2020-12-20 NOTE — Progress Notes (Signed)
Subjective: Alejandra Hernandez is a 65 y.o. female patient with history of diabetes who presents to office today complaining of long,mildly painful nails  while ambulating in shoes; unable to trim. Patient states that the glucose reading this morning was not recorded but ranges now around 100 after increasing insulin. Reports that last A1c was 11 and last visit to pcp was 1 month ago. No other issues.   Patient Active Problem List   Diagnosis Date Noted  . Pulmonary nodule 08/03/2020  . Acidosis 07/06/2020  . Complaints of total body pain 07/10/2019  . Left upper extremity swelling 06/03/2019  . COPD, mild ([place]) 05/26/2019  . Leukocytosis 04/14/2019  . Avascular necrosis of humeral head, left ([place]) 04/14/2019  . Hypokalemia 04/14/2019  . Neuropathy 04/14/2019  . DKA (diabetic ketoacidoses) 04/13/2019  . Polyp of transverse colon   . Atypical chest pain 11/16/2018  . Uncontrolled insulin dependent diabetes mellitus 05/23/2018  . CKD (chronic kidney disease), stage III ([place]) 05/14/2018  . Hyperglycemia 12/23/2017  . Low TSH level 06/22/2017  . CAD (coronary artery disease)   . Nodule on liver 05/22/2017  . Heart palpitations 05/14/2017  . Unspecified skin changes 04/18/2017  . History of tobacco use 03/17/2017  . Benign neoplasm of descending colon   . Benign neoplasm of sigmoid colon   . Chronic diastolic CHF (congestive heart failure) ([place]) 02/09/2017  . History of DVT (deep vein thrombosis)   . Abnormal CT scan, chest   . Acute urinary retention   . Claudication ([place]) 07/23/2016  . PAD (peripheral artery disease) ([place])   . Essential hypertension 05/14/2016  . Uncontrolled type 2 diabetes mellitus with ketoacidosis without coma, with long-term current use of insulin ([place])   . GERD (gastroesophageal reflux disease) 05/17/2014  . Carotid artery disease ([place]) 05/17/2014  . Iron deficiency anemia 04/26/2014  . Dyspnea 04/25/2014  . Carotid artery bruit 04/25/2014  . Hyperlipidemia  with target low density lipoprotein (LDL) cholesterol less than 70 mg/dL   . Gout    Current Outpatient Medications on File Prior to Visit  Medication Sig Dispense Refill  . acetaminophen (TYLENOL) 325 MG tablet Take 650 mg by mouth 2 (two) times a day.    . albuterol (PROAIR HFA) 108 (90 Base) MCG/ACT inhaler Inhale 2 puffs into the lungs every 4 (four) hours as needed for wheezing or shortness of breath. 1 Inhaler 5  . amitriptyline (ELAVIL) 25 MG tablet Take 75 mg by mouth at bedtime.     Marland Kitchen amLODipine (NORVASC) 10 MG tablet Take 10 mg by mouth at bedtime.    Marland Kitchen aspirin 81 MG chewable tablet Chew 1 tablet (81 mg total) by mouth daily. 30 tablet 3  . atropine 1 % ophthalmic solution Comments:   Patient Notes: three times a day as needed    . Blood Glucose Monitoring Suppl (ACCU-CHEK GUIDE ME) w/Device KIT 4 (four) times daily. for testing    . Calcium Carbonate Antacid (TUMS PO) Take 2 tablets by mouth 3 (three) times daily as needed (acid indigestion).     . cloNIDine (CATAPRES) 0.1 MG tablet Take 0.1 mg by mouth 2 (two) times daily.    . Continuous Blood Gluc Receiver (FREESTYLE LIBRE 14 DAY READER) DEVI Scan as needed.    . cyclobenzaprine (FLEXERIL) 10 MG tablet Take 1 tablet (10 mg total) by mouth 2 (two) times daily as needed for muscle spasms. 20 tablet 0  . diclofenac Sodium (VOLTAREN) 1 % GEL Apply 1 application topically daily as  needed (pain). Apply to feet for pain as needed 150 g 2  . ezetimibe (ZETIA) 10 MG tablet TAKE 1 TABLET(10 MG) BY MOUTH DAILY 90 tablet 2  . ferrous sulfate 325 (65 FE) MG tablet Take 325 mg by mouth daily with breakfast.     . furosemide (LASIX) 20 MG tablet TAKE 1 TABLET(20 MG) BY MOUTH EVERY MORNING (Patient taking differently: Take 20 mg by mouth daily.) 30 tablet 5  . gabapentin (NEURONTIN) 600 MG tablet Take 0.5 tablets (300 mg total) by mouth 3 (three) times daily. 90 tablet 2  . glucose blood (ACCU-CHEK AVIVA PLUS) test strip 1 each by Other route 2  (two) times daily. And lancets 2/day 200 each 3  . hydroxypropyl methylcellulose / hypromellose (ISOPTO TEARS / GONIOVISC) 2.5 % ophthalmic solution Place 1 drop into both eyes 3 (three) times daily as needed for dry eyes.    . Insulin Degludec (TRESIBA FLEXTOUCH) 200 UNIT/ML SOPN Inject 68 Units into the skin daily. And pen needles 1/day (Patient taking differently: Inject 10 Units into the skin every morning. And pen needles 1/day) 9 pen 11  . insulin lispro (HUMALOG) 100 UNIT/ML injection Inject 1-10 Units into the skin 3 (three) times daily before meals. Per sliding scale    . isosorbide mononitrate (IMDUR) 30 MG 24 hr tablet TAKE 3 TABLETS(90 MG) BY MOUTH DAILY 270 tablet 2  . lidocaine-prilocaine (EMLA) cream Apply 1 application topically See admin instructions. Apply 2-3 grams (1 gram = 1 inch) to back 4 times daily for pain    . Melatonin 3 MG CAPS Take by mouth.    . metolazone (ZAROXOLYN) 2.5 MG tablet Take by mouth.    . nitroGLYCERIN (NITROSTAT) 0.4 MG SL tablet PLACE 1 TABLET UNDER THE TONGUE EVERY 5 MINUTES AS NEEDED FOR CHEST PAIN 25 tablet 7  . NON FORMULARY Accu-Chek Aviva Plus test strips  USE FOUR TIMES DAILY    . NURTEC 75 MG TBDP SMARTSIG:1 Tablet(s) Sublingual Every Other Day    . oxyCODONE-acetaminophen (PERCOCET) 10-325 MG tablet Take 1 tablet by mouth 4 (four) times daily as needed for pain.   0  . pantoprazole (PROTONIX) 40 MG tablet TAKE 1 TABLET BY MOUTH TWICE DAILY BEFORE A MEAL (Patient taking differently: Take 40 mg by mouth 2 (two) times daily.) 180 tablet 0  . polyethylene glycol powder (MIRALAX) 17 GM/SCOOP powder Start taking 1 capful 3 times a day. Slowly cut back as needed until you have normal bowel movements. 255 g 0  . potassium chloride SA (KLOR-CON) 20 MEQ tablet TAKE 1/2 TABLET BY MOUTH EVERY DAY 90 tablet 1  . PRESCRIPTION MEDICATION APPLY 1 APPLICATION TOPICALLY  Cream compounded at [place] - B5, D3 G10, K10 Pent+ - apply 1 gram (1 gram = 1 pump)  to affected area 4 times daily as needed for pain    . rivaroxaban (XARELTO) 20 MG TABS tablet Take 1 tablet (20 mg total) by mouth daily with supper. 30 tablet 11  . rosuvastatin (CRESTOR) 40 MG tablet Take 1 tablet (40 mg total) by mouth daily at 6 PM. 90 tablet 0  . rosuvastatin (CRESTOR) 40 MG tablet Take 40 mg by mouth daily at 6 PM.    . Tiotropium Bromide Monohydrate (SPIRIVA RESPIMAT) 2.5 MCG/ACT AERS Inhale 2 puffs into the lungs daily. 4 g 5  . Vitamin D, Ergocalciferol, (DRISDOL) 50000 units CAPS capsule Take 50,000 Units by mouth every Wednesday.   10   No current facility-administered medications on  file prior to visit.   Allergies  Allergen Reactions  . Lisinopril Hives  . Losartan Other (See Comments)  . Sodium Bicarbonate Other (See Comments)      Objective: General: Patient is awake, alert, and oriented x 3 and in no acute distress.  Integument: Skin is warm, dry and supple bilateral. Nails are tender, long, thickened and dystrophic with subungual debris, consistent with onychomycosis, 1-5 bilateral. No signs of infection. Mild callus to bilateral hallux, remaining integument unremarkable.  Vasculature:  Dorsalis Pedis pulse 1/4 bilateral. Posterior Tibial pulse  1/4 bilateral. Capillary fill time <3 sec 1-5 bilateral. Positive hair growth to the level of the digits.Temperature gradient within normal limits.   Neurology: The patient has diminished sensation measured with a 5.07/10g Semmes Weinstein Monofilament at all pedal sites bilateral. Vibratory sensation diminished bilateral with tuning fork. No Babinski sign present bilateral.   Musculoskeletal: Asymptomatic hallux limitus, pes planus, hammertoe pedal deformities noted bilateral. Muscular strength 4/5 in all lower extremity muscular groups bilateral without pain on range of motion . No tenderness with calf compression bilateral.  Assessment and Plan: Problem List Items Addressed This Visit      Cardiovascular  and Mediastinum   PAD (peripheral artery disease) ([place])    Other Visit Diagnoses    Pain due to onychomycosis of toenails of both feet    -  Primary   Type 2 diabetes, uncontrolled, with neuropathy ([place])         -Examined patient. -Re-Discussed and educated patient on diabetic foot care  -Mechanically debrided all nails 1-5 bilateral using sterile nail nipper and filed with dremel without incident  -Recommend new diabetic shoes to help with pains to feet/callus areas; patient to see Liliane Channel -Patient to return  in 3 months for at risk foot nail care and callus care -Patient advised to call the office if any problems or questions arise in the meantime.  Landis Martins, DPM

## 2020-12-21 ENCOUNTER — Other Ambulatory Visit: Payer: Self-pay | Admitting: General Practice

## 2021-01-01 ENCOUNTER — Other Ambulatory Visit: Payer: Self-pay

## 2021-01-01 ENCOUNTER — Ambulatory Visit: Payer: Medicare Other | Admitting: Orthotics

## 2021-01-01 DIAGNOSIS — I739 Peripheral vascular disease, unspecified: Secondary | ICD-10-CM

## 2021-01-01 DIAGNOSIS — M79675 Pain in left toe(s): Secondary | ICD-10-CM

## 2021-01-01 DIAGNOSIS — B351 Tinea unguium: Secondary | ICD-10-CM

## 2021-01-01 DIAGNOSIS — IMO0002 Reserved for concepts with insufficient information to code with codable children: Secondary | ICD-10-CM

## 2021-01-01 DIAGNOSIS — E1140 Type 2 diabetes mellitus with diabetic neuropathy, unspecified: Secondary | ICD-10-CM

## 2021-01-01 NOTE — Progress Notes (Signed)

## 2021-01-04 ENCOUNTER — Ambulatory Visit (INDEPENDENT_AMBULATORY_CARE_PROVIDER_SITE_OTHER): Payer: Medicare Other

## 2021-01-04 ENCOUNTER — Ambulatory Visit (HOSPITAL_COMMUNITY)
Admission: EM | Admit: 2021-01-04 | Discharge: 2021-01-04 | Disposition: A | Discharge status: Home / Self Care | Payer: Medicare Other | Attending: Emergency Medicine | Admitting: Emergency Medicine

## 2021-01-04 ENCOUNTER — Other Ambulatory Visit: Payer: Self-pay

## 2021-01-04 ENCOUNTER — Encounter (HOSPITAL_COMMUNITY): Payer: Self-pay | Admitting: Emergency Medicine

## 2021-01-04 DIAGNOSIS — Z8249 Family history of ischemic heart disease and other diseases of the circulatory system: Secondary | ICD-10-CM | POA: Insufficient documentation

## 2021-01-04 DIAGNOSIS — G8929 Other chronic pain: Secondary | ICD-10-CM | POA: Insufficient documentation

## 2021-01-04 DIAGNOSIS — R0602 Shortness of breath: Secondary | ICD-10-CM | POA: Insufficient documentation

## 2021-01-04 DIAGNOSIS — N183 Chronic kidney disease, stage 3 unspecified: Secondary | ICD-10-CM | POA: Insufficient documentation

## 2021-01-04 DIAGNOSIS — Z794 Long term (current) use of insulin: Secondary | ICD-10-CM | POA: Diagnosis not present

## 2021-01-04 DIAGNOSIS — Z79899 Other long term (current) drug therapy: Secondary | ICD-10-CM | POA: Diagnosis not present

## 2021-01-04 DIAGNOSIS — Z20822 Contact with and (suspected) exposure to covid-19: Secondary | ICD-10-CM | POA: Diagnosis not present

## 2021-01-04 DIAGNOSIS — M545 Low back pain, unspecified: Secondary | ICD-10-CM | POA: Insufficient documentation

## 2021-01-04 DIAGNOSIS — Z7901 Long term (current) use of anticoagulants: Secondary | ICD-10-CM | POA: Insufficient documentation

## 2021-01-04 DIAGNOSIS — F17210 Nicotine dependence, cigarettes, uncomplicated: Secondary | ICD-10-CM | POA: Insufficient documentation

## 2021-01-04 DIAGNOSIS — R600 Localized edema: Secondary | ICD-10-CM

## 2021-01-04 DIAGNOSIS — R059 Cough, unspecified: Secondary | ICD-10-CM

## 2021-01-04 DIAGNOSIS — Z86718 Personal history of other venous thrombosis and embolism: Secondary | ICD-10-CM | POA: Diagnosis not present

## 2021-01-04 DIAGNOSIS — E1140 Type 2 diabetes mellitus with diabetic neuropathy, unspecified: Secondary | ICD-10-CM | POA: Diagnosis not present

## 2021-01-04 DIAGNOSIS — R062 Wheezing: Secondary | ICD-10-CM

## 2021-01-04 DIAGNOSIS — I1310 Hypertensive heart and chronic kidney disease without heart failure, with stage 1 through stage 4 chronic kidney disease, or unspecified chronic kidney disease: Secondary | ICD-10-CM | POA: Diagnosis not present

## 2021-01-04 DIAGNOSIS — E1122 Type 2 diabetes mellitus with diabetic chronic kidney disease: Secondary | ICD-10-CM | POA: Insufficient documentation

## 2021-01-04 DIAGNOSIS — I2510 Atherosclerotic heart disease of native coronary artery without angina pectoris: Secondary | ICD-10-CM | POA: Diagnosis not present

## 2021-01-04 DIAGNOSIS — R079 Chest pain, unspecified: Secondary | ICD-10-CM

## 2021-01-04 DIAGNOSIS — I701 Atherosclerosis of renal artery: Secondary | ICD-10-CM | POA: Diagnosis not present

## 2021-01-04 DIAGNOSIS — I739 Peripheral vascular disease, unspecified: Secondary | ICD-10-CM | POA: Insufficient documentation

## 2021-01-04 DIAGNOSIS — J449 Chronic obstructive pulmonary disease, unspecified: Secondary | ICD-10-CM | POA: Diagnosis not present

## 2021-01-04 DIAGNOSIS — I252 Old myocardial infarction: Secondary | ICD-10-CM | POA: Insufficient documentation

## 2021-01-04 DIAGNOSIS — R0789 Other chest pain: Secondary | ICD-10-CM | POA: Diagnosis not present

## 2021-01-04 NOTE — ED Triage Notes (Signed)
Pt presents with dry cough, chest tightness, SOB, and ankle swelling xs 5 days. Pt also c/o of left hip pain.   Pt states out of albuterol inhaler, has not called pulm doc to refill medication. States only using Spiriva.

## 2021-01-04 NOTE — Discharge Instructions (Signed)
Go to the emergency department if you have chest pain, shortness of breath, increased edema, or other concerning symptoms.    Follow-up with your primary care provider on Monday.
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
## 2021-01-05 LAB — SARS CORONAVIRUS 2 (TAT 6-24 HRS): SARS Coronavirus 2: NEGATIVE

## 2021-01-15 ENCOUNTER — Telehealth: Payer: Self-pay | Admitting: Sports Medicine

## 2021-01-15 NOTE — Telephone Encounter (Signed)
Received call from [place] @ pts pcp asking how quickly did we need paperwork for diabetic shoes signed because pt was seen a few weeks ago by the PA and is not scheduled to come back in to the pcp until April. I explained that it would be up to the patient if she wanted to wait but I would recommend it get done asap. I also told the cma it was ok to do a virtual visit with the md. She was calling the pt

## 2021-01-29 ENCOUNTER — Ambulatory Visit
Admission: RE | Admit: 2021-01-29 | Discharge: 2021-01-29 | Disposition: A | Discharge status: Home / Self Care | Payer: Medicare Other | Source: Ambulatory Visit | Attending: Internal Medicine | Admitting: Internal Medicine

## 2021-01-29 ENCOUNTER — Other Ambulatory Visit: Payer: Self-pay

## 2021-01-29 DIAGNOSIS — Z1231 Encounter for screening mammogram for malignant neoplasm of breast: Secondary | ICD-10-CM

## 2021-02-10 ENCOUNTER — Other Ambulatory Visit: Payer: Self-pay | Admitting: Cardiology

## 2021-02-10 ENCOUNTER — Other Ambulatory Visit: Payer: Self-pay | Admitting: Cardiovascular Disease

## 2021-03-21 ENCOUNTER — Other Ambulatory Visit: Payer: Self-pay

## 2021-03-21 ENCOUNTER — Ambulatory Visit: Payer: Medicare Other | Admitting: Sports Medicine

## 2021-03-21 ENCOUNTER — Ambulatory Visit (INDEPENDENT_AMBULATORY_CARE_PROVIDER_SITE_OTHER): Payer: Medicare Other | Admitting: Cardiology

## 2021-03-21 ENCOUNTER — Encounter: Payer: Self-pay | Admitting: Cardiology

## 2021-03-21 VITALS — BP 148/72 | HR 94 | Resp 94 | Ht 63.0 in | Wt 159.6 lb

## 2021-03-21 DIAGNOSIS — E785 Hyperlipidemia, unspecified: Secondary | ICD-10-CM | POA: Diagnosis not present

## 2021-03-21 DIAGNOSIS — R072 Precordial pain: Secondary | ICD-10-CM

## 2021-03-21 DIAGNOSIS — N1831 Chronic kidney disease, stage 3a: Secondary | ICD-10-CM

## 2021-03-21 DIAGNOSIS — I2510 Atherosclerotic heart disease of native coronary artery without angina pectoris: Secondary | ICD-10-CM

## 2021-03-21 DIAGNOSIS — R011 Cardiac murmur, unspecified: Secondary | ICD-10-CM

## 2021-03-21 DIAGNOSIS — I10 Essential (primary) hypertension: Secondary | ICD-10-CM | POA: Diagnosis not present

## 2021-03-21 DIAGNOSIS — I739 Peripheral vascular disease, unspecified: Secondary | ICD-10-CM

## 2021-03-21 DIAGNOSIS — Z86718 Personal history of other venous thrombosis and embolism: Secondary | ICD-10-CM

## 2021-03-21 DIAGNOSIS — R002 Palpitations: Secondary | ICD-10-CM

## 2021-03-21 DIAGNOSIS — I6523 Occlusion and stenosis of bilateral carotid arteries: Secondary | ICD-10-CM

## 2021-03-21 LAB — LIPID PANEL
Chol/HDL Ratio: 2.2 ratio (ref 0.0–4.4)
Cholesterol, Total: 217 mg/dL — ABNORMAL HIGH (ref 100–199)
HDL: 97 mg/dL (ref >39)
LDL Chol Calc (NIH): 106 mg/dL — ABNORMAL HIGH (ref 0–99)
Triglycerides: 79 mg/dL (ref 0–149)
VLDL Cholesterol Cal: 14 mg/dL (ref 5–40)

## 2021-03-21 LAB — ALT: ALT: 14 IU/L (ref 0–32)

## 2021-03-21 MED ORDER — ISOSORBIDE MONONITRATE ER 120 MG PO TB24: 120.0000 mg | ORAL_TABLET | Freq: Every day | ORAL | 3 refills | Status: DC
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
## 2021-03-21 NOTE — Addendum Note (Signed)
Addended by: Antonieta Iba on: 03/21/2021 08:35 AM   Modules accepted: Orders

## 2021-03-21 NOTE — Addendum Note (Signed)
Addended by: Antonieta Iba on: 03/21/2021 08:45 AM   Modules accepted: Orders

## 2021-03-21 NOTE — Patient Instructions (Signed)
Medication Instructions:  Your physician recommends that you continue on your current medications as directed. Please refer to the Current Medication list given to you today.  *If you need a refill on your cardiac medications before your next appointment, please call your pharmacy*   Lab Work: TODAY: Fasting lipids and ALT If you have labs (blood work) drawn today and your tests are completely normal, you will receive your results only by: Marland Kitchen MyChart Message (if you have MyChart) OR . A paper copy in the mail If you have any lab test that is abnormal or we need to change your treatment, we will call you to review the results.  Testing/Procedures: Your physician has requested that you have an echocardiogram. Echocardiography is a painless test that uses sound waves to create images of your heart. It provides your doctor with information about the size and shape of your heart and how well your heart's chambers and valves are working. This procedure takes approximately one hour. There are no restrictions for this procedure.  Your physician has requested that you have a lexiscan myoview. For further information please visit [email or web address]. Please follow instruction sheet, as given.   Follow-Up: At [facility], you and your health needs are our priority.  As part of our continuing mission to provide you with exceptional heart care, we have created designated Provider Care Teams.  These Care Teams include your primary Cardiologist (physician) and Advanced Practice Providers (APPs -  Physician Assistants and Nurse Practitioners) who all work together to provide you with the care you need, when you need it.  Your next appointment:   6 week(s)  The format for your next appointment:   In Person  Provider:   You may see Fransico Him, MD or one of the following Advanced Practice Providers on your designated Care Team:    Melina Copa, PA-C  Ermalinda Barrios, PA-C

## 2021-03-21 NOTE — Addendum Note (Signed)
Addended by: Sueanne Margarita on: 03/21/2021 08:42 AM   Modules accepted: Orders

## 2021-03-25 ENCOUNTER — Telehealth: Payer: Self-pay | Admitting: Cardiology

## 2021-03-25 DIAGNOSIS — E785 Hyperlipidemia, unspecified: Secondary | ICD-10-CM

## 2021-03-25 NOTE — Telephone Encounter (Signed)
Patient returning call for lab results. 

## 2021-03-27 NOTE — Telephone Encounter (Signed)
Alejandra Margarita, MD  03/21/2021 4:59 PM EDT      Patient was not really fasting so have her come in for a 12 hour fasting FLP   The patient has been notified of the result and verbalized understanding.  All questions (if any) were answered. Antonieta Iba, RN 03/27/2021 1:15 PM  Patient will come in fasting next Monday for repeat labs.

## 2021-04-01 ENCOUNTER — Other Ambulatory Visit: Payer: Self-pay | Admitting: Emergency Medicine

## 2021-04-01 ENCOUNTER — Other Ambulatory Visit: Payer: Medicare Other

## 2021-04-08 ENCOUNTER — Telehealth: Payer: Self-pay | Admitting: Cardiology

## 2021-04-08 ENCOUNTER — Other Ambulatory Visit: Payer: Medicare Other | Admitting: *Deleted

## 2021-04-08 ENCOUNTER — Other Ambulatory Visit: Payer: Self-pay

## 2021-04-08 DIAGNOSIS — R609 Edema, unspecified: Secondary | ICD-10-CM

## 2021-04-08 DIAGNOSIS — I5032 Chronic diastolic (congestive) heart failure: Secondary | ICD-10-CM

## 2021-04-08 LAB — BASIC METABOLIC PANEL
BUN/Creatinine Ratio: 19 (ref 12–28)
BUN: 30 mg/dL — ABNORMAL HIGH (ref 8–27)
CO2: 22 mmol/L (ref 20–29)
Calcium: 8.1 mg/dL — ABNORMAL LOW (ref 8.7–10.3)
Chloride: 96 mmol/L (ref 96–106)
Creatinine, Ser: 1.59 mg/dL — ABNORMAL HIGH (ref 0.57–1.00)
Glucose: 301 mg/dL — ABNORMAL HIGH (ref 65–99)
Potassium: 4.1 mmol/L (ref 3.5–5.2)
Sodium: 127 mmol/L — ABNORMAL LOW (ref 134–144)
eGFR: 36 mL/min/{1.73_m2} — ABNORMAL LOW (ref >59)

## 2021-04-08 NOTE — Telephone Encounter (Signed)
Spoke with pt and advised per Dr Radford Pax she would like stat BMET and BNP.  Pt is scheduled for echo 04/18/2021.  Pt verbalizes understanding and agrees with current plan.  Orders placed as requested.

## 2021-04-08 NOTE — Telephone Encounter (Signed)
Pt c/o swelling: STAT is pt has developed SOB within 24 hours  1) How much weight have you gained and in what time span?  5 to 6 punds over night   2) If swelling, where is the swelling located?  Legs and feet and ankles  3) Are you currently taking a fluid pill?  Yes  4) Are you currently SOB?  No   5) Do you have a log of your daily weights (if so, list)? No  6) Have you gained 3 pounds in a day or 5 pounds in a week?  3 pounds in a day   7) Have you traveled recently?  NO   PT is calling with concerns of her legs and feet swelling she cant work on put on shoes.

## 2021-04-08 NOTE — Telephone Encounter (Signed)
Spoke with pt who reports increased bilateral swelling of feet, ankles and legs up to her thigh on right leg.  Pt reports weight has increased 6 pounds over the last week.  BP this am is 149/73 with HR of 90.  She is currently taking Furosemide 40mg  bid and Metalazone 2.5mg  without improvement.  She is also having some mild SOB on exertion.  Will forward information to Dr Radford Pax for review. Reviewed ED precautions.  Pt verbalizes understanding and agrees with current plan.

## 2021-04-09 ENCOUNTER — Telehealth: Payer: Self-pay

## 2021-04-09 DIAGNOSIS — I5032 Chronic diastolic (congestive) heart failure: Secondary | ICD-10-CM

## 2021-04-09 LAB — PRO B NATRIURETIC PEPTIDE: NT-Pro BNP: 514 pg/mL — ABNORMAL HIGH (ref 0–287)

## 2021-04-09 MED ORDER — FUROSEMIDE 40 MG PO TABS: ORAL_TABLET | ORAL | 3 refills | Status: DC

## 2021-04-09 NOTE — Telephone Encounter (Signed)
-----   Message from Sueanne Margarita, MD sent at 04/09/2021  8:25 AM EDT ----- BNP elevated with 6lb weight gain and SOB, LE edema.  Increase lasix to 80mg  BID for 2 days and then down to 60mg  qam and 40mg  qpm and check BMET on Friday.  Please have her call in a few days to let us know if she is feeling better.  If sx worsen she needs to call us back

## 2021-04-09 NOTE — Telephone Encounter (Signed)
Alejandra Margarita, MD  04/08/2021 5:10 PM EDT      Please let patient know that renal function has worsened. Please get her in with her PCP tomorrow as she is on Xarelto and her dose is followed by her PCP and may need to be decreased due to worsening renal function.    The patient has been notified of the result and verbalized understanding.  All questions (if any) were answered. Alejandra Iba, RN 04/09/2021 10:05 AM   Patient understands instructions for her Lasix. She is coming in on Friday for lab work (we will also recheck her cholesterol fasting at that time). She is still having swelling and will let us know if it does not improve in a few days. She is seeing her PCP on Monday and will discuss dose of Xarelto.

## 2021-04-11 ENCOUNTER — Ambulatory Visit (INDEPENDENT_AMBULATORY_CARE_PROVIDER_SITE_OTHER): Payer: Medicare Other | Admitting: Sports Medicine

## 2021-04-11 ENCOUNTER — Other Ambulatory Visit: Payer: Self-pay

## 2021-04-11 ENCOUNTER — Encounter: Payer: Self-pay | Admitting: Sports Medicine

## 2021-04-11 DIAGNOSIS — B351 Tinea unguium: Secondary | ICD-10-CM | POA: Diagnosis not present

## 2021-04-11 DIAGNOSIS — E1140 Type 2 diabetes mellitus with diabetic neuropathy, unspecified: Secondary | ICD-10-CM | POA: Diagnosis not present

## 2021-04-11 DIAGNOSIS — E1165 Type 2 diabetes mellitus with hyperglycemia: Secondary | ICD-10-CM

## 2021-04-11 DIAGNOSIS — M79675 Pain in left toe(s): Secondary | ICD-10-CM

## 2021-04-11 DIAGNOSIS — I739 Peripheral vascular disease, unspecified: Secondary | ICD-10-CM | POA: Diagnosis not present

## 2021-04-11 DIAGNOSIS — IMO0002 Reserved for concepts with insufficient information to code with codable children: Secondary | ICD-10-CM

## 2021-04-11 DIAGNOSIS — M79674 Pain in right toe(s): Secondary | ICD-10-CM

## 2021-04-11 NOTE — Progress Notes (Signed)
Subjective: Alejandra Hernandez is a 65 y.o. female patient with history of diabetes who presents to office today complaining of long,mildly painful nails  while ambulating in shoes; unable to trim. Patient states that the glucose readings have been elevated and last A1c was 13.  Patient reports that she is struggling with fluid retention and to see her doctor on tomorrow for further work-up to check her kidneys.  No other pedal complaints noted.  Patient Active Problem List   Diagnosis Date Noted  . Hypomagnesemia 12/27/2020  . Abnormal findings on diagnostic imaging of other specified body structures 08/27/2020  . Disorder of mineral metabolism, unspecified 08/27/2020  . Personal history of other venous thrombosis and embolism 08/27/2020  . Pulmonary nodule 08/03/2020  . Acidosis 07/06/2020  . Complaints of total body pain 07/10/2019  . Pain, unspecified 07/10/2019  . Left upper extremity swelling 06/03/2019  . COPD, mild ([place]) 05/26/2019  . Leukocytosis 04/14/2019  . Avascular necrosis of humeral head, left ([place]) 04/14/2019  . Hypokalemia 04/14/2019  . Neuropathy 04/14/2019  . DKA (diabetic ketoacidoses) 04/13/2019  . Polyp of transverse colon   . Atypical chest pain 11/16/2018  . Uncontrolled insulin dependent diabetes mellitus 05/23/2018  . CKD (chronic kidney disease), stage III ([place]) 05/14/2018  . Hyperglycemia 12/23/2017  . Low TSH level 06/22/2017  . CAD (coronary artery disease)   . Nodule on liver 05/22/2017  . Liver disease, unspecified 05/22/2017  . Heart palpitations 05/14/2017  . Unspecified skin changes 04/18/2017  . History of tobacco use 03/17/2017  . Benign neoplasm of descending colon   . Benign neoplasm of sigmoid colon   . Chronic diastolic CHF (congestive heart failure) ([place]) 02/09/2017  . History of DVT (deep vein thrombosis)   . Abnormal CT scan, chest   . Acute urinary retention   . Claudication ([place]) 07/23/2016  . PAD (peripheral artery disease) ([place])   .  Essential hypertension 05/14/2016  . Uncontrolled type 2 diabetes mellitus with ketoacidosis without coma, with long-term current use of insulin ([place])   . GERD (gastroesophageal reflux disease) 05/17/2014  . Carotid artery disease ([place]) 05/17/2014  . Iron deficiency anemia 04/26/2014  . Dyspnea 04/25/2014  . Carotid artery bruit 04/25/2014  . Hyperlipidemia with target low density lipoprotein (LDL) cholesterol less than 70 mg/dL   . Gout    Current Outpatient Medications on File Prior to Visit  Medication Sig Dispense Refill  . acetaminophen (TYLENOL) 325 MG tablet Take 650 mg by mouth 2 (two) times a day.    . albuterol (PROAIR HFA) 108 (90 Base) MCG/ACT inhaler Inhale 2 puffs into the lungs every 4 (four) hours as needed for wheezing or shortness of breath. 1 Inhaler 5  . amitriptyline (ELAVIL) 25 MG tablet Take 75 mg by mouth at bedtime.     Marland Kitchen amLODipine (NORVASC) 10 MG tablet Take 10 mg by mouth at bedtime.    Marland Kitchen aspirin 81 MG chewable tablet Chew 1 tablet (81 mg total) by mouth daily. 30 tablet 3  . atropine 1 % ophthalmic solution Comments:   Patient Notes: three times a day as needed    . Blood Glucose Monitoring Suppl (ACCU-CHEK GUIDE ME) w/Device KIT 4 (four) times daily. for testing    . Calcium Carbonate Antacid (TUMS PO) Take 2 tablets by mouth 3 (three) times daily as needed (acid indigestion).     . cloNIDine (CATAPRES) 0.1 MG tablet Take 0.1 mg by mouth 2 (two) times daily.    . Continuous Blood Gluc  Receiver (FREESTYLE LIBRE 14 DAY READER) DEVI Scan as needed.    . cyclobenzaprine (FLEXERIL) 10 MG tablet Take 1 tablet (10 mg total) by mouth 2 (two) times daily as needed for muscle spasms. 20 tablet 0  . diclofenac Sodium (VOLTAREN) 1 % GEL Apply 1 application topically daily as needed (pain). Apply to feet for pain as needed 150 g 2  . ezetimibe (ZETIA) 10 MG tablet TAKE 1 TABLET(10 MG) BY MOUTH DAILY 90 tablet 2  . ferrous sulfate 325 (65 FE) MG tablet Take 325 mg by mouth  daily with breakfast.     . furosemide (LASIX) 40 MG tablet Take 1.5 tablets (60 mg total) every morning and 1 tablet (40 mg total) every afternoon). 90 tablet 3  . gabapentin (NEURONTIN) 600 MG tablet Take 0.5 tablets (300 mg total) by mouth 3 (three) times daily. 90 tablet 2  . glucose blood (ACCU-CHEK AVIVA PLUS) test strip 1 each by Other route 2 (two) times daily. And lancets 2/day 200 each 3  . hydroxypropyl methylcellulose / hypromellose (ISOPTO TEARS / GONIOVISC) 2.5 % ophthalmic solution Place 1 drop into both eyes 3 (three) times daily as needed for dry eyes.    . Insulin Degludec (TRESIBA FLEXTOUCH) 200 UNIT/ML SOPN Inject 68 Units into the skin daily. And pen needles 1/day (Patient taking differently: Inject 10 Units into the skin every morning. And pen needles 1/day) 9 pen 11  . insulin lispro (HUMALOG) 100 UNIT/ML injection Inject 1-10 Units into the skin 3 (three) times daily before meals. Per sliding scale    . isosorbide mononitrate (IMDUR) 120 MG 24 hr tablet Take 1 tablet (120 mg total) by mouth daily. 90 tablet 3  . lidocaine-prilocaine (EMLA) cream Apply 1 application topically See admin instructions. Apply 2-3 grams (1 gram = 1 inch) to back 4 times daily for pain    . Melatonin 3 MG CAPS Take by mouth.    . metolazone (ZAROXOLYN) 2.5 MG tablet Take by mouth.    . nitroGLYCERIN (NITROSTAT) 0.4 MG SL tablet PLACE 1 TABLET UNDER THE TONGUE EVERY 5 MINUTES AS NEEDED FOR CHEST PAIN 25 tablet 7  . NON FORMULARY Accu-Chek Aviva Plus test strips  USE FOUR TIMES DAILY    . NURTEC 75 MG TBDP SMARTSIG:1 Tablet(s) Sublingual Every Other Day    . oxyCODONE-acetaminophen (PERCOCET) 10-325 MG tablet Take 1 tablet by mouth 4 (four) times daily as needed for pain.   0  . pantoprazole (PROTONIX) 40 MG tablet TAKE 1 TABLET BY MOUTH TWICE DAILY BEFORE A MEAL (Patient taking differently: Take 40 mg by mouth 2 (two) times daily.) 180 tablet 0  . polyethylene glycol powder (MIRALAX) 17 GM/SCOOP  powder Start taking 1 capful 3 times a day. Slowly cut back as needed until you have normal bowel movements. 255 g 0  . potassium chloride SA (KLOR-CON) 20 MEQ tablet TAKE 1/2 TABLET BY MOUTH DAILY 90 tablet 1  . PRESCRIPTION MEDICATION APPLY 1 APPLICATION TOPICALLY  Cream compounded at [place] - B5, D3 G10, K10 Pent+ - apply 1 gram (1 gram = 1 pump) to affected area 4 times daily as needed for pain    . rivaroxaban (XARELTO) 20 MG TABS tablet Take 1 tablet (20 mg total) by mouth daily with supper. 30 tablet 11  . rosuvastatin (CRESTOR) 40 MG tablet Take 1 tablet (40 mg total) by mouth daily at 6 PM. 90 tablet 0  . rosuvastatin (CRESTOR) 40 MG tablet Take 40 mg by mouth  daily at 6 PM.    . SPIRIVA RESPIMAT 2.5 MCG/ACT AERS INHALE 2 PUFFS INTO THE LUNGS DAILY 4 g 5  . Vitamin D, Ergocalciferol, (DRISDOL) 50000 units CAPS capsule Take 50,000 Units by mouth every Wednesday.   10   No current facility-administered medications on file prior to visit.   Allergies  Allergen Reactions  . Lisinopril Hives  . Losartan Other (See Comments)  . Sodium Bicarbonate Other (See Comments)      Objective: General: Patient is awake, alert, and oriented x 3 and in no acute distress.  Integument: Skin is warm, dry and supple bilateral. Nails are tender, long, thickened and dystrophic with subungual debris, consistent with onychomycosis, 1-5 bilateral. No signs of infection. Mild callus to bilateral hallux, remaining integument unremarkable.  Vasculature:  Dorsalis Pedis pulse 1/4 bilateral. Posterior Tibial pulse  1/4 bilateral. Capillary fill time <3 sec 1-5 bilateral. Positive hair growth to the level of the digits.Temperature gradient within normal limits.  1+ pitting edema bilateral ankles however easily displaced to be able to lightly palpate the pulses.  Neurology: The patient has diminished sensation measured with a 5.07/10g Semmes Weinstein Monofilament at all pedal sites bilateral. Vibratory  sensation diminished bilateral with tuning fork. No Babinski sign present bilateral.   Musculoskeletal: Asymptomatic hallux limitus, pes planus, hammertoe pedal deformities noted bilateral. Muscular strength 4/5 in all lower extremity muscular groups bilateral without pain on range of motion . No tenderness with calf compression bilateral.  Assessment and Plan: Problem List Items Addressed This Visit      Cardiovascular and Mediastinum   PAD (peripheral artery disease) ([place])    Other Visit Diagnoses    Pain due to onychomycosis of toenails of both feet    -  Primary   Type 2 diabetes, uncontrolled, with neuropathy ([place])         -Examined patient. -Re-Discussed and educated patient on diabetic foot care  -Mechanically debrided all nails 1-5 bilateral using sterile nail nipper and filed with dremel without incident  -Advised patient to continue with comfortable shoes that do not rub feet especially in the setting of increased swelling -Patient to return  in 3 months for at risk foot nail care and callus care -Patient advised to call the office if any problems or questions arise in the meantime.  Landis Martins, DPM

## 2021-04-12 ENCOUNTER — Telehealth: Payer: Self-pay | Admitting: Cardiology

## 2021-04-12 ENCOUNTER — Other Ambulatory Visit: Payer: Medicare Other | Admitting: *Deleted

## 2021-04-12 DIAGNOSIS — E785 Hyperlipidemia, unspecified: Secondary | ICD-10-CM

## 2021-04-12 DIAGNOSIS — I5032 Chronic diastolic (congestive) heart failure: Secondary | ICD-10-CM

## 2021-04-12 LAB — BASIC METABOLIC PANEL
BUN/Creatinine Ratio: 13 (ref 12–28)
BUN: 20 mg/dL (ref 8–27)
CO2: 23 mmol/L (ref 20–29)
Calcium: 8.8 mg/dL (ref 8.7–10.3)
Chloride: 97 mmol/L (ref 96–106)
Creatinine, Ser: 1.53 mg/dL — ABNORMAL HIGH (ref 0.57–1.00)
Glucose: 263 mg/dL — ABNORMAL HIGH (ref 65–99)
Potassium: 3.9 mmol/L (ref 3.5–5.2)
Sodium: 135 mmol/L (ref 134–144)
eGFR: 38 mL/min/{1.73_m2} — ABNORMAL LOW (ref >59)

## 2021-04-12 LAB — LIPID PANEL
Chol/HDL Ratio: 2.1 ratio (ref 0.0–4.4)
Cholesterol, Total: 252 mg/dL — ABNORMAL HIGH (ref 100–199)
HDL: 119 mg/dL (ref >39)
LDL Chol Calc (NIH): 115 mg/dL — ABNORMAL HIGH (ref 0–99)
Triglycerides: 111 mg/dL (ref 0–149)
VLDL Cholesterol Cal: 18 mg/dL (ref 5–40)

## 2021-04-12 NOTE — Telephone Encounter (Signed)
Spoke with the patient and advised her that her lab work that she had drawn today was not back yet. Advised that I would give her a call when we had the results and Dr. Radford Pax has reviewed them. Patient verbalized understanding.

## 2021-04-12 NOTE — Telephone Encounter (Signed)
Follow up:     Patient calling to speak with the nurse concering her results.

## 2021-04-15 ENCOUNTER — Telehealth: Payer: Self-pay

## 2021-04-15 ENCOUNTER — Telehealth (HOSPITAL_COMMUNITY): Payer: Self-pay | Admitting: *Deleted

## 2021-04-15 DIAGNOSIS — E785 Hyperlipidemia, unspecified: Secondary | ICD-10-CM

## 2021-04-15 NOTE — Telephone Encounter (Signed)
-----   Message from Rollen Sox, [facility] sent at 04/15/2021 11:55 AM EDT ----- Patient currently on crestor 40 and Zetia 10.  Recommend referral to lipid clinic to discuss [id number]  ----- Message ----- From: Antonieta Iba, RN Sent: 04/15/2021  11:40 AM EDT To: [facility]  Dr. Radford Pax would like recommendations from lipid clinic

## 2021-04-15 NOTE — Telephone Encounter (Signed)
Patient given detailed instructions per Myocardial Perfusion Study Information Sheet for the test on 04/18/21 at 7:15. Patient notified to arrive 15 minutes early and that it is imperative to arrive on time for appointment to keep from having the test rescheduled.  If you need to cancel or reschedule your appointment, please call the office within 24 hours of your appointment. . Patient verbalized understanding.Alejandra Hernandez

## 2021-04-15 NOTE — Telephone Encounter (Signed)
The patient has been notified of the result and verbalized understanding.  All questions (if any) were answered. Antonieta Iba, RN 04/15/2021 1:41 PM  Referral has been placed.

## 2021-04-18 ENCOUNTER — Other Ambulatory Visit: Payer: Self-pay

## 2021-04-18 ENCOUNTER — Ambulatory Visit (HOSPITAL_COMMUNITY): Payer: Medicare Other | Attending: Cardiology

## 2021-04-18 ENCOUNTER — Ambulatory Visit (HOSPITAL_BASED_OUTPATIENT_CLINIC_OR_DEPARTMENT_OTHER): Payer: Medicare Other

## 2021-04-18 ENCOUNTER — Telehealth: Payer: Self-pay | Admitting: Cardiology

## 2021-04-18 VITALS — Ht 63.0 in | Wt 159.0 lb

## 2021-04-18 DIAGNOSIS — R072 Precordial pain: Secondary | ICD-10-CM

## 2021-04-18 DIAGNOSIS — R011 Cardiac murmur, unspecified: Secondary | ICD-10-CM | POA: Insufficient documentation

## 2021-04-18 DIAGNOSIS — R110 Nausea: Secondary | ICD-10-CM

## 2021-04-18 LAB — ECHOCARDIOGRAM COMPLETE
Area-P 1/2: 2.68 cm2
Height: 63 in
S' Lateral: 2.7 cm
Weight: 2544 oz

## 2021-04-18 LAB — MYOCARDIAL PERFUSION IMAGING
LV dias vol: 68 mL (ref 46–106)
LV sys vol: 30 mL
Peak HR: 97 {beats}/min
Rest HR: 65 {beats}/min
SDS: 1
SRS: 2
SSS: 3
TID: 1.02

## 2021-04-18 MED ORDER — AMINOPHYLLINE 25 MG/ML IV SOLN
150.0000 mg | Freq: Once | INTRAVENOUS | Status: AC
  Administered 2021-04-18: 150 mg via INTRAVENOUS

## 2021-04-18 MED ORDER — TECHNETIUM TC 99M TETROFOSMIN IV KIT
11.0000 | PACK | Freq: Once | INTRAVENOUS | Status: AC | PRN
  Administered 2021-04-18: 11 via INTRAVENOUS
  Filled 2021-04-18: qty 11

## 2021-04-18 MED ORDER — TECHNETIUM TC 99M TETROFOSMIN IV KIT
31.7000 | PACK | Freq: Once | INTRAVENOUS | Status: AC | PRN
Start: 2021-04-18 — End: 2021-04-18
  Administered 2021-04-18: 31.7 via INTRAVENOUS
  Filled 2021-04-18: qty 32

## 2021-04-18 MED ORDER — REGADENOSON 0.4 MG/5ML IV SOLN
0.4000 mg | Freq: Once | INTRAVENOUS | Status: AC
  Administered 2021-04-18: 0.4 mg via INTRAVENOUS

## 2021-04-18 NOTE — Telephone Encounter (Signed)
PT [place] PHONE CALL

## 2021-04-18 NOTE — Telephone Encounter (Signed)
Alejandra Margarita, MD  04/18/2021 12:51 PM EDT      Echo showed normal heart function with mildly thickened AV     The patient has been notified of the result and verbalized understanding.  All questions (if any) were answered. Antonieta Iba, RN 04/18/2021 2:31 PM

## 2021-05-02 ENCOUNTER — Other Ambulatory Visit: Payer: Self-pay

## 2021-05-02 ENCOUNTER — Ambulatory Visit: Payer: Medicare Other | Admitting: Cardiology

## 2021-05-02 ENCOUNTER — Ambulatory Visit (INDEPENDENT_AMBULATORY_CARE_PROVIDER_SITE_OTHER): Payer: Medicare Other | Admitting: Pharmacist

## 2021-05-02 DIAGNOSIS — Z720 Tobacco use: Secondary | ICD-10-CM

## 2021-05-02 DIAGNOSIS — E7800 Pure hypercholesterolemia, unspecified: Secondary | ICD-10-CM

## 2021-05-02 DIAGNOSIS — I6523 Occlusion and stenosis of bilateral carotid arteries: Secondary | ICD-10-CM | POA: Diagnosis not present

## 2021-05-02 MED ORDER — EZETIMIBE 10 MG PO TABS: 10.0000 mg | ORAL_TABLET | Freq: Every day | ORAL | 3 refills | Status: DC

## 2021-05-02 MED ORDER — ROSUVASTATIN CALCIUM 40 MG PO TABS: 40.0000 mg | ORAL_TABLET | Freq: Every day | ORAL | 3 refills | Status: DC

## 2021-05-02 MED ORDER — REPATHA SURECLICK 140 MG/ML ~~LOC~~ SOAJ: 1.0000 "pen " | SUBCUTANEOUS | 3 refills | Status: DC

## 2021-05-02 NOTE — Progress Notes (Signed)
Patient ID: Alejandra Hernandez                 DOB: [birth year]                    MRN: [id number]     HPI: Alejandra Hernandez is a 65 y.o. female patient referred to lipid clinic by Dr Radford Pax. PMH is significant for CAD s/p PCI/DES to RCA 05/2017 (patent on last [facility] 04/2018), PAD with LE stenting in 2017, 12/2017, and 09/2018, right subclavian stenosis, CKD III, HTN, HLD,  DM type 1 with peripheral neuropathy, bilateral DVT 12/2016 on chronic Xarelto, GIB (01/2016 in setting of ABL anemia/PUD), GERD, gout, and tobacco abuse. 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 Scheduled for myoview and echo on 04/18/21 - stress test was low risk, EF 55-65%. Follow up lipid panel showed elevated LDL on rosuvastatin 98m daily and ezetimibe 115mdaily and pt was referred to lipid clinic.  Pt presents today in good spirits. Reports tolerating her rosuvastatin and ezetimibe well, denies missed doses. Still smoking about 2 cigarettes per day. Having leg and foot pain from her neuropathy and PAD so her exercise is limited. Working on decreasing fried food in her diet.  Current Medications: rosuvastatin 4073maily, ezetimibe 84m28mily Risk Factors: CAD s/p PCI at age 56, 99D with stenting x3, subclavian stenosis, HTN, CKD, DM, tobacco use LDL goal: <55mg44m Diet: Breakfast-  eggs or oatmeal. Snack - nabs or fruit. Dinner - decreased fried food to 1x per week, likes fish and chicken.  Exercise: minimal - leg and feet pain  Family History: Diabetes in her mother and sister; Heart disease in her mother; Kidney failure in her mother; Liver cancer in her sister; Thyroid cancer in her mother. There is no history of Colon cancer or Stomach cancer.  Social History: Formerly smoked 1 PPD for 43 years, now smoking 2 cigarettes per day. Denies alcohol and drug use  Labs: 04/12/21: TC 252, TG 111, HDL 119, LDL 115 (rosuvastatin 40mg 16my, ezetimibe 84mg  84my)  Past Medical History:  Diagnosis Date   Anemia    Anxiety    Arthritis    "right knee, back, feet, hands" (10/20/2018)   Bleeding stomach ulcer 01/2016   CAD (coronary artery disease)    05/19/17 PCI with DESx1 to mRCA   [facility] [place] artery disease (HCC)   [place]-59% right and 1-39% left by doppler 05/2018 with stenotic right subclavian artery   Chronic lower back pain    CKD (chronic kidney disease) stage 3, GFR 30-59 ml/min (HCC) 01/2016   Depression    Diabetic peripheral neuropathy (HCC)    DVT (deep venous thrombosis) (HCC) [employer]18   BLE   GERD (gastroesophageal reflux disease)    GI bleed    Gout    Headache    "weekly" (11/27//2019)   History of blood transfusion 2017   "related to stomach bleeding"   Hyperlipidemia    Hypertension    NSTEMI (non-ST elevated myocardial infarction) (HCC) [id number]   PAD (peripheral artery disease) (HCC)   [place] LE stenting in 2017, 12/2017, 09/2018 - Dr. Arida  Fletcher Anonmonia    "several times" (10/20/2018)   Renal artery stenosis (HCC)   [place]  mild to moderate RAS by aortogram in 2017 (no evidence of this on duplex 06/2018).   Stenosis of right subclavian artery (HCC)  Type II diabetes mellitus ([place])     Current Outpatient Medications on File Prior to Visit  Medication Sig Dispense Refill   acetaminophen (TYLENOL) 325 MG tablet Take 650 mg by mouth 2 (two) times a day.     albuterol (PROAIR HFA) 108 (90 Base) MCG/ACT inhaler Inhale 2 puffs into the lungs every 4 (four) hours as needed for wheezing or shortness of breath. 1 Inhaler 5   amitriptyline (ELAVIL) 25 MG tablet Take 75 mg by mouth at bedtime.      amLODipine (NORVASC) 10 MG tablet Take 10 mg by mouth at bedtime.     aspirin 81 MG chewable tablet Chew 1 tablet (81 mg total) by mouth daily. 30 tablet 3   atropine 1 % ophthalmic solution Comments:   Patient Notes: three times a day as needed     Blood Glucose Monitoring Suppl (ACCU-CHEK GUIDE ME) w/Device KIT 4 (four) times daily.  for testing     Calcium Carbonate Antacid (TUMS PO) Take 2 tablets by mouth 3 (three) times daily as needed (acid indigestion).      cloNIDine (CATAPRES) 0.1 MG tablet Take 0.1 mg by mouth 2 (two) times daily.     Continuous Blood Gluc Receiver (FREESTYLE LIBRE 14 DAY READER) DEVI Scan as needed.     cyclobenzaprine (FLEXERIL) 10 MG tablet Take 1 tablet (10 mg total) by mouth 2 (two) times daily as needed for muscle spasms. 20 tablet 0   diclofenac Sodium (VOLTAREN) 1 % GEL Apply 1 application topically daily as needed (pain). Apply to feet for pain as needed 150 g 2   ezetimibe (ZETIA) 10 MG tablet TAKE 1 TABLET(10 MG) BY MOUTH DAILY 90 tablet 2   ferrous sulfate 325 (65 FE) MG tablet Take 325 mg by mouth daily with breakfast.      furosemide (LASIX) 40 MG tablet Take 1.5 tablets (60 mg total) every morning and 1 tablet (40 mg total) every afternoon). 90 tablet 3   gabapentin (NEURONTIN) 600 MG tablet Take 0.5 tablets (300 mg total) by mouth 3 (three) times daily. 90 tablet 2   glucose blood (ACCU-CHEK AVIVA PLUS) test strip 1 each by Other route 2 (two) times daily. And lancets 2/day 200 each 3   hydroxypropyl methylcellulose / hypromellose (ISOPTO TEARS / GONIOVISC) 2.5 % ophthalmic solution Place 1 drop into both eyes 3 (three) times daily as needed for dry eyes.     Insulin Degludec (TRESIBA FLEXTOUCH) 200 UNIT/ML SOPN Inject 68 Units into the skin daily. And pen needles 1/day (Patient taking differently: Inject 10 Units into the skin every morning. And pen needles 1/day) 9 pen 11   insulin lispro (HUMALOG) 100 UNIT/ML injection Inject 1-10 Units into the skin 3 (three) times daily before meals. Per sliding scale     isosorbide mononitrate (IMDUR) 120 MG 24 hr tablet Take 1 tablet (120 mg total) by mouth daily. 90 tablet 3   lidocaine-prilocaine (EMLA) cream Apply 1 application topically See admin instructions. Apply 2-3 grams (1 gram = 1 inch) to back 4 times daily for pain     Melatonin 3 MG  CAPS Take by mouth.     metolazone (ZAROXOLYN) 2.5 MG tablet Take by mouth.     nitroGLYCERIN (NITROSTAT) 0.4 MG SL tablet PLACE 1 TABLET UNDER THE TONGUE EVERY 5 MINUTES AS NEEDED FOR CHEST PAIN 25 tablet 7   NON FORMULARY Accu-Chek Aviva Plus test strips  USE FOUR TIMES DAILY     NURTEC 75 MG TBDP  SMARTSIG:1 Tablet(s) Sublingual Every Other Day     oxyCODONE-acetaminophen (PERCOCET) 10-325 MG tablet Take 1 tablet by mouth 4 (four) times daily as needed for pain.   0   pantoprazole (PROTONIX) 40 MG tablet TAKE 1 TABLET BY MOUTH TWICE DAILY BEFORE A MEAL (Patient taking differently: Take 40 mg by mouth 2 (two) times daily.) 180 tablet 0   polyethylene glycol powder (MIRALAX) 17 GM/SCOOP powder Start taking 1 capful 3 times a day. Slowly cut back as needed until you have normal bowel movements. 255 g 0   potassium chloride SA (KLOR-CON) 20 MEQ tablet TAKE 1/2 TABLET BY MOUTH DAILY 90 tablet 1   PRESCRIPTION MEDICATION APPLY 1 APPLICATION TOPICALLY  Cream compounded at [place] - B5, D3 G10, K10 Pent+ - apply 1 gram (1 gram = 1 pump) to affected area 4 times daily as needed for pain     rivaroxaban (XARELTO) 20 MG TABS tablet Take 1 tablet (20 mg total) by mouth daily with supper. 30 tablet 11   rosuvastatin (CRESTOR) 40 MG tablet Take 1 tablet (40 mg total) by mouth daily at 6 PM. 90 tablet 0   rosuvastatin (CRESTOR) 40 MG tablet Take 40 mg by mouth daily at 6 PM.     SPIRIVA RESPIMAT 2.5 MCG/ACT AERS INHALE 2 PUFFS INTO THE LUNGS DAILY 4 g 5   Vitamin D, Ergocalciferol, (DRISDOL) 50000 units CAPS capsule Take 50,000 Units by mouth every Wednesday.   10   No current facility-administered medications on file prior to visit.    Allergies  Allergen Reactions   Lisinopril Hives   Losartan Other (See Comments)   Sodium Bicarbonate Other (See Comments)    Assessment/Plan:  1. Hyperlipidemia - LDL 139m/dL on rosuvastatin 431mdaily and ezetimibe 1069maily, above goal <76m63m given  extensive CAD, PAD, and DM. Will start Repatha 140mg6m and continue other meds, prior auth submitted and approved through 11/01/21. Recheck lipids in 3 months same day as visit with Dr TurneRadford Pax change ezetimibe to Nexlizet if LDL remains above goal at that time - not preferred today due to lower efficacy and pt's hx of gout. Pt encouraged to eat heart healthy diet.  2. Tobacco abuse - pt smoking 2-3 cigarettes per day. Encouraged complete cessation.  Kyesha Balla E. Sylvanna Burggraf, PharmD, BCACP, CPP [place] [id number]: [phone]: [phone] 9:24 AM

## 2021-05-02 NOTE — Patient Instructions (Signed)
Your LDL is 115 and your goal is < 55  Continue taking rosuvastatin 40mg  daily and ezetimibe 10mg  daily  I'll submit information to your insurance to see if they will cover either Repatha or Praluent injections. They're given once every 2 weeks in the fatty tissue of your stomach or upper outer thigh. Store the medication in the fridge. They will lower your LDL by 60% and also help to prevent heart attacks and strokes  Recheck fasting cholesterol when you see Dr Radford Pax in September

## 2021-05-15 NOTE — Telephone Encounter (Signed)
erorr

## 2021-05-18 ENCOUNTER — Encounter (HOSPITAL_COMMUNITY): Payer: Self-pay

## 2021-05-18 ENCOUNTER — Other Ambulatory Visit: Payer: Self-pay

## 2021-05-18 ENCOUNTER — Emergency Department (HOSPITAL_COMMUNITY): Payer: Medicare Other

## 2021-05-18 ENCOUNTER — Ambulatory Visit (HOSPITAL_COMMUNITY)
Admission: EM | Admit: 2021-05-18 | Discharge: 2021-05-18 | Disposition: A | Discharge status: Home / Self Care | Payer: Medicare Other | Attending: Medical Oncology | Admitting: Medical Oncology

## 2021-05-18 ENCOUNTER — Inpatient Hospital Stay (HOSPITAL_COMMUNITY)
Admission: EM | Admit: 2021-05-18 | Discharge: 2021-05-20 | DRG: 683 | Disposition: A | Discharge status: Home / Self Care | Payer: Medicare Other | Attending: Internal Medicine | Admitting: Internal Medicine

## 2021-05-18 DIAGNOSIS — K219 Gastro-esophageal reflux disease without esophagitis: Secondary | ICD-10-CM | POA: Diagnosis not present

## 2021-05-18 DIAGNOSIS — I10 Essential (primary) hypertension: Secondary | ICD-10-CM | POA: Diagnosis not present

## 2021-05-18 DIAGNOSIS — R197 Diarrhea, unspecified: Secondary | ICD-10-CM | POA: Diagnosis not present

## 2021-05-18 DIAGNOSIS — D631 Anemia in chronic kidney disease: Secondary | ICD-10-CM | POA: Diagnosis present

## 2021-05-18 DIAGNOSIS — N183 Chronic kidney disease, stage 3 unspecified: Secondary | ICD-10-CM | POA: Diagnosis present

## 2021-05-18 DIAGNOSIS — Z833 Family history of diabetes mellitus: Secondary | ICD-10-CM

## 2021-05-18 DIAGNOSIS — I2510 Atherosclerotic heart disease of native coronary artery without angina pectoris: Secondary | ICD-10-CM | POA: Diagnosis present

## 2021-05-18 DIAGNOSIS — R1110 Vomiting, unspecified: Secondary | ICD-10-CM

## 2021-05-18 DIAGNOSIS — Z20822 Contact with and (suspected) exposure to covid-19: Secondary | ICD-10-CM | POA: Diagnosis present

## 2021-05-18 DIAGNOSIS — J449 Chronic obstructive pulmonary disease, unspecified: Secondary | ICD-10-CM | POA: Diagnosis present

## 2021-05-18 DIAGNOSIS — IMO0002 Reserved for concepts with insufficient information to code with codable children: Secondary | ICD-10-CM | POA: Diagnosis present

## 2021-05-18 DIAGNOSIS — Z955 Presence of coronary angioplasty implant and graft: Secondary | ICD-10-CM | POA: Diagnosis not present

## 2021-05-18 DIAGNOSIS — F17210 Nicotine dependence, cigarettes, uncomplicated: Secondary | ICD-10-CM | POA: Diagnosis present

## 2021-05-18 DIAGNOSIS — Z86718 Personal history of other venous thrombosis and embolism: Secondary | ICD-10-CM | POA: Diagnosis not present

## 2021-05-18 DIAGNOSIS — I5032 Chronic diastolic (congestive) heart failure: Secondary | ICD-10-CM | POA: Diagnosis present

## 2021-05-18 DIAGNOSIS — A084 Viral intestinal infection, unspecified: Secondary | ICD-10-CM | POA: Diagnosis present

## 2021-05-18 DIAGNOSIS — F32A Depression, unspecified: Secondary | ICD-10-CM | POA: Diagnosis present

## 2021-05-18 DIAGNOSIS — E1151 Type 2 diabetes mellitus with diabetic peripheral angiopathy without gangrene: Secondary | ICD-10-CM | POA: Diagnosis present

## 2021-05-18 DIAGNOSIS — R45851 Suicidal ideations: Secondary | ICD-10-CM | POA: Diagnosis present

## 2021-05-18 DIAGNOSIS — R002 Palpitations: Secondary | ICD-10-CM | POA: Diagnosis not present

## 2021-05-18 DIAGNOSIS — Z79899 Other long term (current) drug therapy: Secondary | ICD-10-CM

## 2021-05-18 DIAGNOSIS — E1142 Type 2 diabetes mellitus with diabetic polyneuropathy: Secondary | ICD-10-CM | POA: Diagnosis present

## 2021-05-18 DIAGNOSIS — I130 Hypertensive heart and chronic kidney disease with heart failure and stage 1 through stage 4 chronic kidney disease, or unspecified chronic kidney disease: Secondary | ICD-10-CM | POA: Diagnosis present

## 2021-05-18 DIAGNOSIS — E861 Hypovolemia: Secondary | ICD-10-CM | POA: Diagnosis present

## 2021-05-18 DIAGNOSIS — E119 Type 2 diabetes mellitus without complications: Secondary | ICD-10-CM | POA: Diagnosis present

## 2021-05-18 DIAGNOSIS — E860 Dehydration: Secondary | ICD-10-CM | POA: Diagnosis present

## 2021-05-18 DIAGNOSIS — E876 Hypokalemia: Secondary | ICD-10-CM | POA: Diagnosis present

## 2021-05-18 DIAGNOSIS — R42 Dizziness and giddiness: Secondary | ICD-10-CM

## 2021-05-18 DIAGNOSIS — E871 Hypo-osmolality and hyponatremia: Secondary | ICD-10-CM | POA: Diagnosis present

## 2021-05-18 DIAGNOSIS — Z8249 Family history of ischemic heart disease and other diseases of the circulatory system: Secondary | ICD-10-CM | POA: Diagnosis not present

## 2021-05-18 DIAGNOSIS — Z7982 Long term (current) use of aspirin: Secondary | ICD-10-CM

## 2021-05-18 DIAGNOSIS — M109 Gout, unspecified: Secondary | ICD-10-CM | POA: Diagnosis present

## 2021-05-18 DIAGNOSIS — Z7901 Long term (current) use of anticoagulants: Secondary | ICD-10-CM | POA: Diagnosis not present

## 2021-05-18 DIAGNOSIS — N179 Acute kidney failure, unspecified: Principal | ICD-10-CM | POA: Diagnosis present

## 2021-05-18 DIAGNOSIS — Z808 Family history of malignant neoplasm of other organs or systems: Secondary | ICD-10-CM | POA: Diagnosis not present

## 2021-05-18 DIAGNOSIS — E785 Hyperlipidemia, unspecified: Secondary | ICD-10-CM | POA: Diagnosis present

## 2021-05-18 DIAGNOSIS — E1122 Type 2 diabetes mellitus with diabetic chronic kidney disease: Secondary | ICD-10-CM | POA: Diagnosis present

## 2021-05-18 DIAGNOSIS — N1832 Chronic kidney disease, stage 3b: Secondary | ICD-10-CM | POA: Diagnosis present

## 2021-05-18 DIAGNOSIS — Z800 Family history of malignant neoplasm of digestive organs: Secondary | ICD-10-CM

## 2021-05-18 DIAGNOSIS — R112 Nausea with vomiting, unspecified: Secondary | ICD-10-CM | POA: Diagnosis present

## 2021-05-18 DIAGNOSIS — G894 Chronic pain syndrome: Secondary | ICD-10-CM | POA: Diagnosis present

## 2021-05-18 DIAGNOSIS — E1165 Type 2 diabetes mellitus with hyperglycemia: Secondary | ICD-10-CM | POA: Diagnosis present

## 2021-05-18 DIAGNOSIS — I252 Old myocardial infarction: Secondary | ICD-10-CM

## 2021-05-18 DIAGNOSIS — N189 Chronic kidney disease, unspecified: Secondary | ICD-10-CM | POA: Diagnosis not present

## 2021-05-18 DIAGNOSIS — Z794 Long term (current) use of insulin: Secondary | ICD-10-CM | POA: Diagnosis not present

## 2021-05-18 DIAGNOSIS — Z86711 Personal history of pulmonary embolism: Secondary | ICD-10-CM

## 2021-05-18 DIAGNOSIS — E7800 Pure hypercholesterolemia, unspecified: Secondary | ICD-10-CM | POA: Diagnosis not present

## 2021-05-18 LAB — URINALYSIS, ROUTINE W REFLEX MICROSCOPIC
Bilirubin Urine: NEGATIVE
Glucose, UA: 150 mg/dL — AB
Ketones, ur: NEGATIVE mg/dL
Leukocytes,Ua: NEGATIVE
Nitrite: NEGATIVE
Protein, ur: 100 mg/dL — AB
Specific Gravity, Urine: 1.005 (ref 1.005–1.030)
pH: 7 (ref 5.0–8.0)

## 2021-05-18 LAB — CBC WITH DIFFERENTIAL/PLATELET
Abs Immature Granulocytes: 0.03 10*3/uL (ref 0.00–0.07)
Basophils Absolute: 0.1 10*3/uL (ref 0.0–0.1)
Basophils Relative: 1 %
Eosinophils Absolute: 0.1 10*3/uL (ref 0.0–0.5)
Eosinophils Relative: 1 %
HCT: 46.5 % — ABNORMAL HIGH (ref 36.0–46.0)
Hemoglobin: 14.6 g/dL (ref 12.0–15.0)
Immature Granulocytes: 0 %
Lymphocytes Relative: 34 %
Lymphs Abs: 3.3 10*3/uL (ref 0.7–4.0)
MCH: 21.2 pg — ABNORMAL LOW (ref 26.0–34.0)
MCHC: 31.4 g/dL (ref 30.0–36.0)
MCV: 67.6 fL — ABNORMAL LOW (ref 80.0–100.0)
Monocytes Absolute: 0.7 10*3/uL (ref 0.1–1.0)
Monocytes Relative: 7 %
Neutro Abs: 5.8 10*3/uL (ref 1.7–7.7)
Neutrophils Relative %: 57 %
Platelets: 303 10*3/uL (ref 150–400)
RBC: 6.88 MIL/uL — ABNORMAL HIGH (ref 3.87–5.11)
RDW: 18.1 % — ABNORMAL HIGH (ref 11.5–15.5)
WBC: 10 10*3/uL (ref 4.0–10.5)
nRBC: 0 % (ref 0.0–0.2)

## 2021-05-18 LAB — COMPREHENSIVE METABOLIC PANEL
ALT: 23 U/L (ref 0–44)
AST: 30 U/L (ref 15–41)
Albumin: 3.4 g/dL — ABNORMAL LOW (ref 3.5–5.0)
Alkaline Phosphatase: 110 U/L (ref 38–126)
Anion gap: 18 — ABNORMAL HIGH (ref 5–15)
BUN: 17 mg/dL (ref 8–23)
CO2: 24 mmol/L (ref 22–32)
Calcium: 9.3 mg/dL (ref 8.9–10.3)
Chloride: 79 mmol/L — ABNORMAL LOW (ref 98–111)
Creatinine, Ser: 1.68 mg/dL — ABNORMAL HIGH (ref 0.44–1.00)
GFR, Estimated: 34 mL/min — ABNORMAL LOW (ref >60)
Glucose, Bld: 230 mg/dL — ABNORMAL HIGH (ref 70–99)
Potassium: 3 mmol/L — ABNORMAL LOW (ref 3.5–5.1)
Sodium: 121 mmol/L — ABNORMAL LOW (ref 135–145)
Total Bilirubin: 0.8 mg/dL (ref 0.3–1.2)
Total Protein: 7.8 g/dL (ref 6.5–8.1)

## 2021-05-18 LAB — LIPASE, BLOOD: Lipase: 28 U/L (ref 11–51)

## 2021-05-18 LAB — SODIUM, URINE, RANDOM: Sodium, Ur: 39 mmol/L

## 2021-05-18 LAB — CREATININE, URINE, RANDOM: Creatinine, Urine: 22.87 mg/dL

## 2021-05-18 MED ORDER — POTASSIUM CHLORIDE CRYS ER 20 MEQ PO TBCR
20.0000 meq | EXTENDED_RELEASE_TABLET | Freq: Once | ORAL | Status: AC
  Administered 2021-05-19: 20 meq via ORAL
  Filled 2021-05-18: qty 1

## 2021-05-18 MED ORDER — SODIUM CHLORIDE 0.9 % IV SOLN: Freq: Once | INTRAVENOUS | Status: AC

## 2021-05-18 MED ORDER — IOHEXOL 300 MG/ML  SOLN
100.0000 mL | Freq: Once | INTRAMUSCULAR | Status: AC | PRN
  Administered 2021-05-18: 100 mL via INTRAVENOUS

## 2021-05-18 MED ORDER — ONDANSETRON HCL 4 MG/2ML IJ SOLN
4.0000 mg | Freq: Once | INTRAMUSCULAR | Status: AC
  Administered 2021-05-18: 4 mg via INTRAVENOUS
  Filled 2021-05-18: qty 2

## 2021-05-18 MED ORDER — FENTANYL CITRATE (PF) 100 MCG/2ML IJ SOLN
50.0000 ug | Freq: Once | INTRAMUSCULAR | Status: AC
  Administered 2021-05-18: 50 ug via INTRAVENOUS
  Filled 2021-05-18: qty 2

## 2021-05-18 MED ORDER — INSULIN ASPART 100 UNIT/ML IJ SOLN
0.0000 [IU] | INTRAMUSCULAR | Status: DC
  Administered 2021-05-19: 5 [IU] via SUBCUTANEOUS
  Administered 2021-05-19: 3 [IU] via SUBCUTANEOUS
  Administered 2021-05-19 (×4): 5 [IU] via SUBCUTANEOUS
  Administered 2021-05-20: 2 [IU] via SUBCUTANEOUS
  Administered 2021-05-20 (×3): 3 [IU] via SUBCUTANEOUS

## 2021-05-18 MED ORDER — POTASSIUM CHLORIDE 10 MEQ/100ML IV SOLN
10.0000 meq | INTRAVENOUS | Status: DC
  Filled 2021-05-18: qty 100

## 2021-05-18 NOTE — ED Notes (Signed)
2 RN attempted IV unsuccessfully. IV team consult placed

## 2021-05-18 NOTE — ED Triage Notes (Signed)
Pt reports headache, diarrhea, nausea, dizziness starting Monday. Endorses minimal shortness of breath but denies chest pain.

## 2021-05-18 NOTE — ED Triage Notes (Signed)
Patient complains of dizziness with nausea, vomiting and diarrhea since Monday. Denies pain. Alert and oriented, NAD. Patient has been taking pepto with minimal relief

## 2021-05-18 NOTE — ED Notes (Signed)
Bladder scan deferred due to pt's ability to void adequately

## 2021-05-18 NOTE — ED Notes (Signed)
Pt alert, NAD, calm, interactive, resps e/u, speaking in clear complete sentences. Family at BS.  

## 2021-05-18 NOTE — ED Notes (Signed)
Patient is being discharged from the Urgent Care and sent to the Emergency Department via POV. Per [place] PA, patient is in need of higher level of care due to intractable vomiting with palpitations. Patient is aware and verbalizes understanding of plan of care.  Vitals:   05/18/21 1235  BP: (!) 159/75  Pulse: 76  Resp: 18  Temp: 98.7 F (37.1 C)  SpO2: (S) 93%

## 2021-05-18 NOTE — H&P (Signed)
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 04/01/21  Yes Leslye Peer, MD  Vitamin D, Ergocalciferol, (DRISDOL) 50000 units CAPS capsule Take 50,000 Units by mouth every Wednesday.  04/03/18  Yes [provider]  potassium chloride SA (KLOR-CON) 20 MEQ tablet TAKE 1/2 TABLET BY MOUTH DAILY Patient not taking: No sig reported 02/11/21   Iran Ouch, MD    ___________________________________________________________________________________________________ Physical Exam: Vitals with BMI 05/18/2021 05/18/2021 05/18/2021  Height - - -  Weight - - -  BMI - - -  Systolic 158 184 925  Diastolic 94 79 75  Pulse 78 79 78      1. General:   in No Acute distress   Chronically ill   -appearing 2. Psychological: Alert and  Oriented 3. Head/ENT:   Dry Mucous Membranes                          Head Non traumatic, neck supple                           Poor Dentition 4. SKIN:  decreased Skin turgor,  Skin clean Dry and intact no rash 5. Heart: Regular rate and rhythm no Murmur, no Rub or gallop 6. Lungs:   no wheezes or crackles   7. Abdomen: Soft,  non-tender, Non distended  obese bowel sounds present 8. Lower extremities: no clubbing, cyanosis, no  edema 9. Neurologically Grossly intact, moving all 4 extremities equally   10. MSK: Normal range of motion    Chart has been reviewed  ______________________________________________________________________________________________  Assessment/Plan 65 y.o. female with medical history significant of CKD III, CAD, diabetes, DVT, GERD, GI bleed, HLD, HTN, hyponatremia, anemia of chronic kidney disease   Admitted for dehydration and hyponatremia  Present on Admission:  Hyponatremia - rehydrate and follow closely, obtain urine elctrolytes   DM (diabetes mellitus), type 2, uncontrolled (HCC) -  - Order Sensitive   - continue home insulin regimen    -  check TSH and HgA1C  - Hold by mouth medications  Hyperlipidemia chronic continue home medication    GERD (gastroesophageal reflux disease) chronic continue home medication   Essential hypertension restart chronic   COPD, mild (HCC) -not on home oxygen continue to monitor as needed albuterol as needed Currently stable    CKD (chronic kidney disease), stage III (HCC) -  -chronic avoid nephrotoxic medications such as NSAIDs, Vanco Zosyn combo,  avoid hypotension, continue to follow renal function   Chronic diastolic CHF (congestive heart failure) (HCC) dry   hold off Lasix gently rehydrate and follow renal function.  Avoid fluid overload may need to restart diuresis as needed    CAD (coronary artery disease) continue statin and  aspirin   Hypokalemia - - will replace and repeat in AM,  check magnesium level and replace as needed    Nausea vomiting and diarrhea obtain gastric panel.  Likely gastroenteritis imaging unremarkable   Dehydration -will rehydrate and follow   Other plan as per orders.  DVT prophylaxis:  Xarelto   Code Status:    Code Status: Prior FULL CODE  as per patient   I had personally discussed CODE STATUS with patient     Family Communication:   Family not at  Bedside    Disposition Plan:       To home once workup is complete and patient is stable   Following barriers for discharge:                            Electrolytes corrected                               Anemia corrected                             Pain controlled with PO medications                               Afebrile, white count improving able to transition to PO antibiotics                             Will need to be able to tolerate PO                            Will likely need home health, home O2, set up                           Will need consultants to evaluate patient prior to discharge                    Would benefit from PT/OT eval prior to DC  Ordered                                       Diabetes care coordinator                                        Consults called:   sent smart chat to Dr. Justin Mend with [facility] please check in AM  Admission status:  ED Disposition     ED Disposition  Admit   Condition  --   [place]: [facility] [[id number]]  Level of Care: Progressive [102]  Admit to Progressive based on following criteria: NEPHROLOGY stable condition requiring close monitoring for AKI, requiring Hemodialysis or Peritoneal Dialysis either from expected electrolyte imbalance, acidosis, or fluid overload that can be managed by NIPPV or high flow oxygen.  May admit patient to [facility] or [facility] if equivalent level of care is available:: No  Covid Evaluation:  Symptomatic Person Under Investigation (PUI)  Diagnosis: Hyponatremia [062694]  Admitting Physician: Toy Baker [[id number]]  Attending Physician: Toy Baker [[id number]]  Estimated length of stay: past midnight tomorrow  Certification:: I certify this patient will need inpatient services for at least 2 midnights          inpatient     I Expect 2 midnight stay secondary to severity of patient's current illness need for inpatient interventions justified by the following:    Severe lab/radiological/exam abnormalities including:    hyponatremia and extensive comorbidities including:  Chronic pain  DM2   CHF  CAD  COPD/asthma   CKD    That are currently affecting medical management.   I expect  patient to be hospitalized for 2 midnights requiring inpatient medical care.  Patient is at high risk for adverse outcome (such as loss of life or disability) if not treated.  Indication for inpatient stay as follows:  Severe change from baseline regarding mental status Hemodynamic instability despite maximal medical therapy,  ongoing suicidal ideations,  severe pain requiring acute inpatient management,  inability to maintain oral hydration   persistent chest pain despite medical management Need for operative/procedural  intervention New or worsening hypoxia  Need for  IV fluids, frequent labs   Level of care   progressive tele indefinitely please discontinue once patient no longer qualifies COVID-19 Labs    Lab Results  Component Value Date   [place] 01/04/2021     Precautions: admitted as PUI   PPE: Used by the provider:   N95  eye Goggles,  Gloves     Monya Kozakiewicz 05/18/2021, 2:02 AM    [facility]     after 2 AM please page floor coverage PA If 7AM-7PM, please contact the day team taking care of the patient using [email or web address]   Patient was evaluated in the context of the global COVID-19 pandemic, which necessitated  consideration that the patient might be at risk for infection with the SARS-CoV-2 virus that causes COVID-19. Institutional protocols and algorithms that pertain to the evaluation of patients at risk for COVID-19 are in a state of rapid change based on information released by regulatory bodies including the CDC and federal and state organizations. These policies and algorithms were followed during the patient's care.
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
## 2021-05-19 ENCOUNTER — Inpatient Hospital Stay (HOSPITAL_COMMUNITY): Payer: Medicare Other

## 2021-05-19 DIAGNOSIS — I2510 Atherosclerotic heart disease of native coronary artery without angina pectoris: Secondary | ICD-10-CM

## 2021-05-19 DIAGNOSIS — N1832 Chronic kidney disease, stage 3b: Secondary | ICD-10-CM

## 2021-05-19 DIAGNOSIS — E876 Hypokalemia: Secondary | ICD-10-CM

## 2021-05-19 DIAGNOSIS — N179 Acute kidney failure, unspecified: Principal | ICD-10-CM

## 2021-05-19 DIAGNOSIS — E860 Dehydration: Secondary | ICD-10-CM | POA: Diagnosis present

## 2021-05-19 DIAGNOSIS — E1165 Type 2 diabetes mellitus with hyperglycemia: Secondary | ICD-10-CM

## 2021-05-19 DIAGNOSIS — R197 Diarrhea, unspecified: Secondary | ICD-10-CM | POA: Diagnosis present

## 2021-05-19 DIAGNOSIS — N189 Chronic kidney disease, unspecified: Secondary | ICD-10-CM

## 2021-05-19 DIAGNOSIS — I5032 Chronic diastolic (congestive) heart failure: Secondary | ICD-10-CM

## 2021-05-19 DIAGNOSIS — Z86718 Personal history of other venous thrombosis and embolism: Secondary | ICD-10-CM

## 2021-05-19 DIAGNOSIS — J449 Chronic obstructive pulmonary disease, unspecified: Secondary | ICD-10-CM

## 2021-05-19 LAB — CBC WITH DIFFERENTIAL/PLATELET
Abs Immature Granulocytes: 0.02 10*3/uL (ref 0.00–0.07)
Basophils Absolute: 0.1 10*3/uL (ref 0.0–0.1)
Basophils Relative: 1 %
Eosinophils Absolute: 0.1 10*3/uL (ref 0.0–0.5)
Eosinophils Relative: 1 %
HCT: 44.1 % (ref 36.0–46.0)
Hemoglobin: 13.7 g/dL (ref 12.0–15.0)
Immature Granulocytes: 0 %
Lymphocytes Relative: 29 %
Lymphs Abs: 2.7 10*3/uL (ref 0.7–4.0)
MCH: 21 pg — ABNORMAL LOW (ref 26.0–34.0)
MCHC: 31.1 g/dL (ref 30.0–36.0)
MCV: 67.6 fL — ABNORMAL LOW (ref 80.0–100.0)
Monocytes Absolute: 1.2 10*3/uL — ABNORMAL HIGH (ref 0.1–1.0)
Monocytes Relative: 13 %
Neutro Abs: 5.2 10*3/uL (ref 1.7–7.7)
Neutrophils Relative %: 56 %
Platelets: 334 10*3/uL (ref 150–400)
RBC: 6.52 MIL/uL — ABNORMAL HIGH (ref 3.87–5.11)
RDW: 18 % — ABNORMAL HIGH (ref 11.5–15.5)
WBC: 9.2 10*3/uL (ref 4.0–10.5)
nRBC: 0 % (ref 0.0–0.2)

## 2021-05-19 LAB — BASIC METABOLIC PANEL
Anion gap: 12 (ref 5–15)
Anion gap: 12 (ref 5–15)
BUN: 14 mg/dL (ref 8–23)
BUN: 16 mg/dL (ref 8–23)
CO2: 25 mmol/L (ref 22–32)
CO2: 28 mmol/L (ref 22–32)
Calcium: 8.5 mg/dL — ABNORMAL LOW (ref 8.9–10.3)
Calcium: 9 mg/dL (ref 8.9–10.3)
Chloride: 85 mmol/L — ABNORMAL LOW (ref 98–111)
Chloride: 91 mmol/L — ABNORMAL LOW (ref 98–111)
Creatinine, Ser: 1.48 mg/dL — ABNORMAL HIGH (ref 0.44–1.00)
Creatinine, Ser: 2.03 mg/dL — ABNORMAL HIGH (ref 0.44–1.00)
GFR, Estimated: 27 mL/min — ABNORMAL LOW (ref >60)
GFR, Estimated: 39 mL/min — ABNORMAL LOW (ref >60)
Glucose, Bld: 241 mg/dL — ABNORMAL HIGH (ref 70–99)
Glucose, Bld: 262 mg/dL — ABNORMAL HIGH (ref 70–99)
Potassium: 2.4 mmol/L — CL (ref 3.5–5.1)
Potassium: 3.3 mmol/L — ABNORMAL LOW (ref 3.5–5.1)
Sodium: 125 mmol/L — ABNORMAL LOW (ref 135–145)
Sodium: 128 mmol/L — ABNORMAL LOW (ref 135–145)

## 2021-05-19 LAB — TSH: TSH: 0.508 u[IU]/mL (ref 0.350–4.500)

## 2021-05-19 LAB — MAGNESIUM
Magnesium: 2 mg/dL (ref 1.7–2.4)
Magnesium: 2.2 mg/dL (ref 1.7–2.4)

## 2021-05-19 LAB — RESP PANEL BY RT-PCR (FLU A&B, COVID) ARPGX2
Influenza A by PCR: NEGATIVE
Influenza B by PCR: NEGATIVE
SARS Coronavirus 2 by RT PCR: NEGATIVE

## 2021-05-19 LAB — COMPREHENSIVE METABOLIC PANEL
ALT: 24 U/L (ref 0–44)
AST: 29 U/L (ref 15–41)
Albumin: 3.3 g/dL — ABNORMAL LOW (ref 3.5–5.0)
Alkaline Phosphatase: 118 U/L (ref 38–126)
Anion gap: 13 (ref 5–15)
BUN: 14 mg/dL (ref 8–23)
CO2: 28 mmol/L (ref 22–32)
Calcium: 9.4 mg/dL (ref 8.9–10.3)
Chloride: 86 mmol/L — ABNORMAL LOW (ref 98–111)
Creatinine, Ser: 1.52 mg/dL — ABNORMAL HIGH (ref 0.44–1.00)
GFR, Estimated: 38 mL/min — ABNORMAL LOW (ref >60)
Glucose, Bld: 239 mg/dL — ABNORMAL HIGH (ref 70–99)
Potassium: 2.4 mmol/L — CL (ref 3.5–5.1)
Sodium: 127 mmol/L — ABNORMAL LOW (ref 135–145)
Total Bilirubin: 0.3 mg/dL (ref 0.3–1.2)
Total Protein: 7.7 g/dL (ref 6.5–8.1)

## 2021-05-19 LAB — PHOSPHORUS
Phosphorus: 3.5 mg/dL (ref 2.5–4.6)
Phosphorus: 3.7 mg/dL (ref 2.5–4.6)

## 2021-05-19 LAB — GLUCOSE, CAPILLARY
Glucose-Capillary: 279 mg/dL — ABNORMAL HIGH (ref 70–99)
Glucose-Capillary: 207 mg/dL — ABNORMAL HIGH (ref 70–99)
Glucose-Capillary: 265 mg/dL — ABNORMAL HIGH (ref 70–99)
Glucose-Capillary: 265 mg/dL — ABNORMAL HIGH (ref 70–99)
Glucose-Capillary: 260 mg/dL — ABNORMAL HIGH (ref 70–99)

## 2021-05-19 LAB — CBG MONITORING, ED: Glucose-Capillary: 276 mg/dL — ABNORMAL HIGH (ref 70–99)

## 2021-05-19 LAB — HIV ANTIBODY (ROUTINE TESTING W REFLEX): HIV Screen 4th Generation wRfx: NONREACTIVE

## 2021-05-19 LAB — CK: Total CK: 362 U/L — ABNORMAL HIGH (ref 38–234)

## 2021-05-19 LAB — LACTIC ACID, PLASMA: Lactic Acid, Venous: 1 mmol/L (ref 0.5–1.9)

## 2021-05-19 LAB — OSMOLALITY: Osmolality: 278 mOsm/kg (ref 275–295)

## 2021-05-19 MED ORDER — TIOTROPIUM BROMIDE MONOHYDRATE 2.5 MCG/ACT IN AERS: 2.0000 | INHALATION_SPRAY | Freq: Every day | RESPIRATORY_TRACT | Status: DC

## 2021-05-19 MED ORDER — PANTOPRAZOLE SODIUM 40 MG PO TBEC
40.0000 mg | DELAYED_RELEASE_TABLET | Freq: Two times a day (BID) | ORAL | Status: DC
  Administered 2021-05-19 – 2021-05-20 (×3): 40 mg via ORAL
  Filled 2021-05-19 (×3): qty 1

## 2021-05-19 MED ORDER — HYDROCODONE-ACETAMINOPHEN 5-325 MG PO TABS: 1.0000 | ORAL_TABLET | ORAL | Status: DC | PRN

## 2021-05-19 MED ORDER — GABAPENTIN 600 MG PO TABS
300.0000 mg | ORAL_TABLET | Freq: Two times a day (BID) | ORAL | Status: DC
  Administered 2021-05-19 – 2021-05-20 (×2): 300 mg via ORAL
  Filled 2021-05-19 (×2): qty 1

## 2021-05-19 MED ORDER — SODIUM CHLORIDE 0.9 % IV SOLN: INTRAVENOUS | Status: DC

## 2021-05-19 MED ORDER — ACETAMINOPHEN 325 MG PO TABS
650.0000 mg | ORAL_TABLET | Freq: Four times a day (QID) | ORAL | Status: DC | PRN
  Administered 2021-05-19: 650 mg via ORAL
  Filled 2021-05-19: qty 2

## 2021-05-19 MED ORDER — ISOSORBIDE MONONITRATE ER 60 MG PO TB24
120.0000 mg | ORAL_TABLET | Freq: Every day | ORAL | Status: DC
  Administered 2021-05-19 – 2021-05-20 (×2): 120 mg via ORAL
  Filled 2021-05-19 (×2): qty 2

## 2021-05-19 MED ORDER — ACETAMINOPHEN 650 MG RE SUPP: 650.0000 mg | Freq: Four times a day (QID) | RECTAL | Status: DC | PRN

## 2021-05-19 MED ORDER — ALBUTEROL SULFATE (2.5 MG/3ML) 0.083% IN NEBU: 2.5000 mg | INHALATION_SOLUTION | RESPIRATORY_TRACT | Status: DC | PRN

## 2021-05-19 MED ORDER — POTASSIUM CHLORIDE CRYS ER 20 MEQ PO TBCR
40.0000 meq | EXTENDED_RELEASE_TABLET | Freq: Once | ORAL | Status: AC
  Administered 2021-05-19: 40 meq via ORAL
  Filled 2021-05-19: qty 2

## 2021-05-19 MED ORDER — EZETIMIBE 10 MG PO TABS
10.0000 mg | ORAL_TABLET | Freq: Every day | ORAL | Status: DC
  Administered 2021-05-19 – 2021-05-20 (×2): 10 mg via ORAL
  Filled 2021-05-19 (×2): qty 1

## 2021-05-19 MED ORDER — AMLODIPINE BESYLATE 10 MG PO TABS
10.0000 mg | ORAL_TABLET | Freq: Every day | ORAL | Status: DC
  Administered 2021-05-19: 10 mg via ORAL
  Filled 2021-05-19: qty 1

## 2021-05-19 MED ORDER — UMECLIDINIUM BROMIDE 62.5 MCG/INH IN AEPB
1.0000 | INHALATION_SPRAY | Freq: Every day | RESPIRATORY_TRACT | Status: DC
  Administered 2021-05-20: 1 via RESPIRATORY_TRACT
  Filled 2021-05-19: qty 7

## 2021-05-19 MED ORDER — ACETAMINOPHEN 325 MG PO TABS
325.0000 mg | ORAL_TABLET | Freq: Four times a day (QID) | ORAL | Status: DC | PRN
  Administered 2021-05-20: 325 mg via ORAL
  Filled 2021-05-19: qty 1

## 2021-05-19 MED ORDER — OXYCODONE HCL 5 MG PO TABS
10.0000 mg | ORAL_TABLET | Freq: Four times a day (QID) | ORAL | Status: DC | PRN
  Administered 2021-05-20 (×2): 10 mg via ORAL
  Filled 2021-05-19 (×2): qty 2

## 2021-05-19 MED ORDER — ROSUVASTATIN CALCIUM 20 MG PO TABS
40.0000 mg | ORAL_TABLET | Freq: Every day | ORAL | Status: DC
  Administered 2021-05-19: 40 mg via ORAL
  Filled 2021-05-19: qty 2

## 2021-05-19 MED ORDER — FERROUS SULFATE 325 (65 FE) MG PO TABS
325.0000 mg | ORAL_TABLET | Freq: Every day | ORAL | Status: DC
  Administered 2021-05-19 – 2021-05-20 (×2): 325 mg via ORAL
  Filled 2021-05-19 (×2): qty 1

## 2021-05-19 MED ORDER — OXYCODONE-ACETAMINOPHEN 10-325 MG PO TABS: 1.0000 | ORAL_TABLET | Freq: Four times a day (QID) | ORAL | Status: DC | PRN

## 2021-05-19 MED ORDER — AMITRIPTYLINE HCL 25 MG PO TABS
75.0000 mg | ORAL_TABLET | Freq: Every day | ORAL | Status: DC
  Administered 2021-05-19: 75 mg via ORAL
  Filled 2021-05-19: qty 1
  Filled 2021-05-19: qty 3
  Filled 2021-05-19: qty 1
  Filled 2021-05-19: qty 3

## 2021-05-19 MED ORDER — ALBUTEROL SULFATE HFA 108 (90 BASE) MCG/ACT IN AERS: 2.0000 | INHALATION_SPRAY | RESPIRATORY_TRACT | Status: DC | PRN

## 2021-05-19 MED ORDER — NICOTINE 14 MG/24HR TD PT24
14.0000 mg | MEDICATED_PATCH | Freq: Every day | TRANSDERMAL | Status: DC
  Administered 2021-05-19 – 2021-05-20 (×2): 14 mg via TRANSDERMAL
  Filled 2021-05-19 (×2): qty 1

## 2021-05-19 MED ORDER — POTASSIUM CHLORIDE CRYS ER 20 MEQ PO TBCR
40.0000 meq | EXTENDED_RELEASE_TABLET | ORAL | Status: AC
  Administered 2021-05-19 (×2): 40 meq via ORAL
  Filled 2021-05-19 (×2): qty 2

## 2021-05-19 MED ORDER — CYCLOBENZAPRINE HCL 10 MG PO TABS: 10.0000 mg | ORAL_TABLET | Freq: Two times a day (BID) | ORAL | Status: DC | PRN

## 2021-05-19 MED ORDER — CLONIDINE HCL 0.1 MG PO TABS
0.1000 mg | ORAL_TABLET | Freq: Two times a day (BID) | ORAL | Status: DC
  Administered 2021-05-19 – 2021-05-20 (×3): 0.1 mg via ORAL
  Filled 2021-05-19 (×3): qty 1

## 2021-05-19 MED ORDER — OXYCODONE-ACETAMINOPHEN 7.5-325 MG PO TABS
1.0000 | ORAL_TABLET | Freq: Four times a day (QID) | ORAL | Status: DC | PRN
  Administered 2021-05-19 (×2): 1 via ORAL
  Filled 2021-05-19 (×3): qty 1

## 2021-05-19 MED ORDER — ASPIRIN 81 MG PO CHEW
81.0000 mg | CHEWABLE_TABLET | Freq: Every day | ORAL | Status: DC
  Administered 2021-05-19 – 2021-05-20 (×2): 81 mg via ORAL
  Filled 2021-05-19 (×2): qty 1

## 2021-05-19 MED ORDER — INSULIN GLARGINE 100 UNIT/ML ~~LOC~~ SOLN
10.0000 [IU] | Freq: Every day | SUBCUTANEOUS | Status: DC
  Administered 2021-05-19 – 2021-05-20 (×2): 10 [IU] via SUBCUTANEOUS
  Filled 2021-05-19 (×2): qty 0.1

## 2021-05-19 MED ORDER — RIVAROXABAN 20 MG PO TABS
20.0000 mg | ORAL_TABLET | Freq: Every day | ORAL | Status: DC
  Administered 2021-05-19: 20 mg via ORAL
  Filled 2021-05-19: qty 1

## 2021-05-19 MED ORDER — POTASSIUM CHLORIDE IN NACL 40-0.9 MEQ/L-% IV SOLN
INTRAVENOUS | Status: DC
  Administered 2021-05-19: 100 mL/h via INTRAVENOUS
  Filled 2021-05-19: qty 1000

## 2021-05-19 MED ORDER — SODIUM CHLORIDE 0.9 % IV SOLN
75.0000 mL/h | INTRAVENOUS | Status: DC
  Administered 2021-05-19: 75 mL/h via INTRAVENOUS

## 2021-05-19 MED ORDER — POTASSIUM CHLORIDE 10 MEQ/100ML IV SOLN
10.0000 meq | INTRAVENOUS | Status: DC
  Administered 2021-05-19: 10 meq via INTRAVENOUS

## 2021-05-19 MED ORDER — GABAPENTIN 600 MG PO TABS: 300.0000 mg | ORAL_TABLET | Freq: Three times a day (TID) | ORAL | Status: DC

## 2021-05-19 NOTE — Progress Notes (Signed)
Inpatient Diabetes Program Recommendations  AACE/ADA: New Consensus Statement on Inpatient Glycemic Control (2015)  Target Ranges:  Prepandial:   less than 140 mg/dL      Peak postprandial:   less than 180 mg/dL (1-2 hours)      Critically ill patients:  140 - 180 mg/dL   Lab Results  Component Value Date   GLUCAP 207 (H) 05/19/2021   HGBA1C 13.1 (H) 06/06/2019    Diabetes history: DM2 Outpatient Diabetes medications: Tresiba 12 units daily, Humalog 0-10 units TID Current orders for Inpatient glycemic control: Lantus 10 units daily, Novolog 0-9 units Q4H  Inpatient Diabetes consult received for "uncontrolled DM".  Agree with current regimen.  A1C is pending.  Will continue to follow while inpatient.  Thank you, Reche Dixon, RN, BSN Diabetes Coordinator Inpatient Diabetes Program [phone] (team pager from 8a-5p)

## 2021-05-19 NOTE — ED Notes (Signed)
Pt's IV noted to be infiltrated, approx 146mL NS in pt's R forearm. IV removed, gauze & coban placed, ace wrap applied
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
## 2021-05-19 NOTE — Progress Notes (Signed)
Pt refused to get stuck for BMP. Notified internal medicine in person. Phlebotomists can draw lab tomorrow per MD.

## 2021-05-19 NOTE — Progress Notes (Signed)
[facility] night shift PCU coverage note.  The nursing staff reported that the patient declined to receive IV potassium.  She is open to have oral potassium replacement.  Her potassium level was 2.4 mmol/L.  She received 20 mEq of potassium chloride orally.  KCl 40 mEq p.o. every 4 hours x2 doses ordered.  Her phosphorus and magnesium level are normal.  Tennis Must, MD

## 2021-05-19 NOTE — Progress Notes (Signed)
Physical Therapy Evaluation Patient Details Name: DOMENICA WEIGHTMAN MRN: [id number] DOB: 25-Mar-1956 Today's Date: 05/19/2021   History of Present Illness  This is a 65 year old lady admitted 6/25 with N/V/D.  Hyponatremia, hypokalemia and anemia. PMH:  chronic kidney disease stage III, coronary artery disease, diabetes, history of DVT, GI bleed, CHF, stent 2018, PAD status post lower extremity stenting 2017 and  2019, renal artery stenosis with mild to moderate disease diagnosed on aortogram 2017.  Clinical Impression  Pt admitted with above diagnosis. Pt was able to ambulate with cane and overall good balance. Pt states she doesn't walk far at home and that she is at baseline. Pt should progress well.  Did let nursing know that pt did not want to put monitor back on -HR and pulse ox- after her walk.  Will follow acutely.  Pt currently with functional limitations due to the deficits listed below (see PT Problem List). Pt will benefit from skilled PT to increase their independence and safety with mobility to allow discharge to the venue listed below.       Follow Up Recommendations No PT follow up    Equipment Recommendations  None recommended by PT    Recommendations for Other Services       Precautions / Restrictions Precautions Precautions: Fall Restrictions Weight Bearing Restrictions: No      Mobility  Bed Mobility Overal bed mobility: Independent                  Transfers Overall transfer level: Independent                  Ambulation/Gait Ambulation/Gait assistance: Min guard Gait Distance (Feet): 150 Feet Assistive device: Straight cane Gait Pattern/deviations: Step-through pattern;Decreased stride length   Gait velocity interpretation: <1.31 ft/sec, indicative of household ambulator General Gait Details: Pt ambulated to hallway and states after about 75 feet that this is further than she walks at home. Pt turns around and walks back to room and states, "  I am 65 years old and I dont like to be told what to do, " when PT asked pt if she would like to sit up or get back in bed.  Pt agreed to lie back down but refused to allow PT to hook the monitor - HR and pulse ox - back up to her.  Talked to NT and let her know that pt refused to allow PT to hook the VS HR and pulse ox back up to her.  Of note, no LOB with ambulation in hallway with cane.  Stairs            Wheelchair Mobility    Modified Rankin (Stroke Patients Only)       Balance Overall balance assessment: Needs assistance Sitting-balance support: No upper extremity supported;Feet supported Sitting balance-Leahy Scale: Fair     Standing balance support: Single extremity supported;During functional activity Standing balance-Leahy Scale: Fair Standing balance comment: No LOB static stance without UE support.  No LOB with cane dynamically.                             Pertinent Vitals/Pain Pain Assessment: Faces Faces Pain Scale: Hurts even more Pain Location: back pain Pain Descriptors / Indicators: Aching Pain Intervention(s): Limited activity within patient's tolerance;Monitored during session;Repositioned    Home Living Family/patient expects to be discharged to:: Private residence Living Arrangements: Children (son and nephew and niece) Available Help at Discharge: Family;Friend(s);Available  PRN/intermittently (pt was cooking for family and she states she isnt going to do it anymore) Type of Home: House Home Access: Stairs to enter Entrance Stairs-Rails: Right Entrance Stairs-Number of Steps: 2 Home Layout: One level Home Equipment: Cane - single point;Shower seat;Bedside commode;Wheelchair - [employer] - 2 wheels      Prior Function Level of Independence: Needs assistance   Gait / Transfers Assistance Needed: use of SPC for mobility  ADL's / Homemaking Assistance Needed: B/D self, cooks for family, cleaning  Comments: Pt states that she does  too much for son, nephew and niece and that she is putting a stop to it when she goes home.     Hand Dominance   Dominant Hand: Right    Extremity/Trunk Assessment   Upper Extremity Assessment Upper Extremity Assessment: Defer to OT evaluation    Lower Extremity Assessment Lower Extremity Assessment: Generalized weakness    Cervical / Trunk Assessment Cervical / Trunk Assessment: Normal  Communication   Communication: No difficulties  Cognition Arousal/Alertness: Awake/alert Behavior During Therapy: WFL for tasks assessed/performed Overall Cognitive Status: Within Functional Limits for tasks assessed                                        General Comments General comments (skin integrity, edema, etc.): VSS initially and then pt refused to put the monitors back on.    Exercises     Assessment/Plan    PT Assessment Patient needs continued PT services  PT Problem List Decreased activity tolerance;Decreased balance;Decreased mobility;Decreased knowledge of use of DME;Decreased safety awareness;Decreased knowledge of precautions       PT Treatment Interventions DME instruction;Gait training;Functional mobility training;Therapeutic activities;Therapeutic exercise;Balance training;Patient/family education;Stair training    PT Goals (Current goals can be found in the Care Plan section)  Acute Rehab PT Goals Patient Stated Goal: to go home PT Goal Formulation: With patient Time For Goal Achievement: 06/02/21 Potential to Achieve Goals: Good    Frequency Min 3X/week   Barriers to discharge        Co-evaluation               AM-PAC PT "6 Clicks" Mobility  Outcome Measure Help needed turning from your back to your side while in a flat bed without using bedrails?: None Help needed moving from lying on your back to sitting on the side of a flat bed without using bedrails?: None Help needed moving to and from a bed to a chair (including a  wheelchair)?: None Help needed standing up from a chair using your arms (e.g., wheelchair or bedside chair)?: None Help needed to walk in hospital room?: A Little Help needed climbing 3-5 steps with a railing? : A Little 6 Click Score: 22    End of Session Equipment Utilized During Treatment: Gait belt Activity Tolerance: Patient tolerated treatment well Patient left: in bed;with call bell/phone within reach Nurse Communication: Mobility status (NT informed that pt did not want the monitors plugged in and that she told PT she gets up by herself.) PT Visit Diagnosis: Muscle weakness (generalized) (M62.81)    Time: 4920-1007 PT Time Calculation (min) (ACUTE ONLY): 23 min   Charges:   PT Evaluation $PT Eval Low Complexity: 1 Low PT Treatments $Gait Training: 8-22 mins        Nandika Stetzer M,PT Acute Rehab Services [phone] (pager)   Alvira Philips 05/19/2021, 4:14 PM

## 2021-05-19 NOTE — ED Notes (Signed)
Report attempted x1; was asked to call back due to bed not being assigned

## 2021-05-19 NOTE — Evaluation (Addendum)
Occupational Therapy Evaluation and Discharge Patient Details Name: Alejandra Hernandez MRN: [id number] DOB: [birth year] Today's Date: 05/19/2021    History of Present Illness This is a 65 year old lady admitted 6/25 with N/V/D.  Hyponatremia, hypokalemia and anemia. PMH:  chronic kidney disease stage III, coronary artery disease, diabetes, history of DVT, GI bleed, CHF, stent 2018, PAD status post lower extremity stenting 2017 and  2019, renal artery stenosis with mild to moderate disease diagnosed on aortogram 2017.   Clinical Impression   Pt admitted for concerns listed above. PTA pt reported that she was independent with all ADL's and IADL's. At the time of the evaluation, pt continued to demonstrate independence with all ADL's, with no safety concerns. Pt educated on energy conservation and fall prevention, agreeable to all education. At this time, pt has no further OT needs and acute OT will sign off. Thanks for the referral.    Follow Up Recommendations  No OT follow up    Equipment Recommendations  Tub/shower bench    Recommendations for Other Services       Precautions / Restrictions Precautions Precautions: Fall Restrictions Weight Bearing Restrictions: No      Mobility Bed Mobility Overal bed mobility: Independent                  Transfers Overall transfer level: Independent                    Balance Overall balance assessment: No apparent balance deficits (not formally assessed) Sitting-balance support: No upper extremity supported;Feet supported Sitting balance-Leahy Scale: Fair     Standing balance support: Single extremity supported;During functional activity Standing balance-Leahy Scale: Fair Standing balance comment: No LOB static stance without UE support.  No LOB with cane dynamically.                           ADL either performed or assessed with clinical judgement   ADL Overall ADL's : Modified independent;At baseline                                        General ADL Comments: Pt able to complete all ADL's with no difficulties. Simulated bathing dressing and grooming sitting and standing in room, as well as functional mobility.     Vision Baseline Vision/History: No visual deficits Patient Visual Report: No change from baseline Vision Assessment?: No apparent visual deficits     Perception Perception Perception Tested?: No   Praxis Praxis Praxis tested?: Not tested    Pertinent Vitals/Pain Pain Assessment: No/denies pain Faces Pain Scale: Hurts even more Pain Location: back pain Pain Descriptors / Indicators: Aching Pain Intervention(s): Limited activity within patient's tolerance;Monitored during session;Repositioned     Hand Dominance Right   Extremity/Trunk Assessment Upper Extremity Assessment Upper Extremity Assessment: Generalized weakness;LUE deficits/detail LUE Deficits / Details: Pt unable to flex shoulder past 20 degrees, abduct past 30-40 degrees, or externally rotate past 90 degrees. Elbow and grip WFL LUE Sensation: WNL LUE Coordination: decreased gross motor   Lower Extremity Assessment Lower Extremity Assessment: Defer to PT evaluation   Cervical / Trunk Assessment Cervical / Trunk Assessment: Normal   Communication Communication Communication: No difficulties   Cognition Arousal/Alertness: Awake/alert Behavior During Therapy: WFL for tasks assessed/performed Overall Cognitive Status: Within Functional Limits for tasks assessed  General Comments  VSS on RA    Exercises     Shoulder Instructions      Home Living Family/patient expects to be discharged to:: Private residence Living Arrangements: Children Available Help at Discharge: Family;Friend(s);Available PRN/intermittently Type of Home: House Home Access: Stairs to enter [employer] of Steps: 2 Entrance Stairs-Rails: Right Home  Layout: One level     Bathroom Shower/Tub: Teacher, early years/pre: Standard Bathroom Accessibility: Yes How Accessible: Accessible via walker Home Equipment: Cane - single point;Shower seat;Bedside commode;Wheelchair - [employer] - 2 wheels          Prior Functioning/Environment Level of Independence: Independent  Gait / Transfers Assistance Needed: use of SPC for mobility ADL's / Homemaking Assistance Needed: B/D self, cooks for family, cleaning   Comments: Pt states that she does too much for son, nephew and niece and that she is putting a stop to it when she goes home.        OT Problem List: Decreased strength;Decreased activity tolerance;Impaired balance (sitting and/or standing);Decreased knowledge of use of DME or AE;Cardiopulmonary status limiting activity      OT Treatment/Interventions:      OT Goals(Current goals can be found in the care plan section) Acute Rehab OT Goals Patient Stated Goal: to go home OT Goal Formulation: All assessment and education complete, DC therapy Time For Goal Achievement: 05/19/21 Potential to Achieve Goals: Good  OT Frequency:     Barriers to D/C:            Co-evaluation              AM-PAC OT "6 Clicks" Daily Activity     Outcome Measure Help from another person eating meals?: None Help from another person taking care of personal grooming?: None Help from another person toileting, which includes using toliet, bedpan, or urinal?: None Help from another person bathing (including washing, rinsing, drying)?: None Help from another person to put on and taking off regular upper body clothing?: None Help from another person to put on and taking off regular lower body clothing?: None 6 Click Score: 24   End of Session Nurse Communication: Mobility status  Activity Tolerance: Patient tolerated treatment well Patient left: in bed;with call bell/phone within reach;with nursing/sitter in room  OT Visit  Diagnosis: Unsteadiness on feet (R26.81);Muscle weakness (generalized) (M62.81)                Time: 9233-0076 OT Time Calculation (min): 23 min Charges:  OT General Charges $OT Visit: 1 Visit OT Evaluation $OT Eval Low Complexity: 1 Low OT Treatments $Self Care/Home Management : 8-22 mins  Riot Waterworth H., OTR/L Acute Rehabilitation  Alejandra Hernandez Alejandra Hernandez 05/19/2021, 5:43 PM

## 2021-05-19 NOTE — Progress Notes (Signed)
Patient refuses IV potassium. MD paged.

## 2021-05-20 DIAGNOSIS — E860 Dehydration: Secondary | ICD-10-CM

## 2021-05-20 LAB — RENAL FUNCTION PANEL
Albumin: 3.1 g/dL — ABNORMAL LOW (ref 3.5–5.0)
Anion gap: 10 (ref 5–15)
BUN: 17 mg/dL (ref 8–23)
CO2: 22 mmol/L (ref 22–32)
Calcium: 9.1 mg/dL (ref 8.9–10.3)
Chloride: 101 mmol/L (ref 98–111)
Creatinine, Ser: 1.79 mg/dL — ABNORMAL HIGH (ref 0.44–1.00)
GFR, Estimated: 31 mL/min — ABNORMAL LOW (ref >60)
Glucose, Bld: 243 mg/dL — ABNORMAL HIGH (ref 70–99)
Phosphorus: 3 mg/dL (ref 2.5–4.6)
Potassium: 3.8 mmol/L (ref 3.5–5.1)
Sodium: 133 mmol/L — ABNORMAL LOW (ref 135–145)

## 2021-05-20 LAB — GLUCOSE, CAPILLARY
Glucose-Capillary: 183 mg/dL — ABNORMAL HIGH (ref 70–99)
Glucose-Capillary: 233 mg/dL — ABNORMAL HIGH (ref 70–99)
Glucose-Capillary: 239 mg/dL — ABNORMAL HIGH (ref 70–99)
Glucose-Capillary: 244 mg/dL — ABNORMAL HIGH (ref 70–99)

## 2021-05-20 LAB — HEMOGLOBIN A1C
Hgb A1c MFr Bld: 10.8 % — ABNORMAL HIGH (ref 4.8–5.6)
Mean Plasma Glucose: 263 mg/dL

## 2021-05-20 MED ORDER — ROSUVASTATIN CALCIUM 40 MG PO TABS: 40.0000 mg | ORAL_TABLET | Freq: Every day | ORAL | Status: DC

## 2021-05-20 MED ORDER — TRESIBA FLEXTOUCH 200 UNIT/ML ~~LOC~~ SOPN: 12.0000 [IU] | PEN_INJECTOR | Freq: Every day | SUBCUTANEOUS | Status: DC

## 2021-05-20 MED ORDER — FUROSEMIDE 40 MG PO TABS: 40.0000 mg | ORAL_TABLET | Freq: Every day | ORAL | 3 refills | Status: DC

## 2021-05-20 MED ORDER — DICLOFENAC SODIUM 1 % EX GEL
4.0000 g | Freq: Four times a day (QID) | CUTANEOUS | Status: DC
  Administered 2021-05-20: 4 g via TOPICAL
  Filled 2021-05-20: qty 100

## 2021-05-20 MED ORDER — POLYETHYLENE GLYCOL 3350 17 GM/SCOOP PO POWD: 17.0000 g | Freq: Every day | ORAL | Status: AC | PRN

## 2021-05-20 MED ORDER — POTASSIUM CHLORIDE CRYS ER 20 MEQ PO TBCR: 10.0000 meq | EXTENDED_RELEASE_TABLET | Freq: Every day | ORAL | 1 refills | Status: DC
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
## 2021-05-20 NOTE — Progress Notes (Addendum)
Inpatient Diabetes Program Recommendations  AACE/ADA: New Consensus Statement on Inpatient Glycemic Control (2015)  Target Ranges:  Prepandial:   less than 140 mg/dL      Peak postprandial:   less than 180 mg/dL (1-2 hours)      Critically ill patients:  140 - 180 mg/dL   Results for Dore, Bethanie L (MRN [id number]) as of 05/20/2021 10:37  Ref. Range 05/19/2021 01:24 05/19/2021 05:10 05/19/2021 08:10 05/19/2021 11:39 05/19/2021 16:38 05/19/2021 19:40  Glucose-Capillary Latest Ref Range: 70 - 99 mg/dL 276 (H)  5 units NOVOLOG  279 (H)  5 units NOVOLOG  207 (H)  3 units NOVOLOG  10 units LANTUS  265 (H)  5 units NOVOLOG  265 (H)  5 units NOVOLOG  260 (H)  5 units NOVOLOG    Results for Kazmierski, Temia L (MRN [id number]) as of 05/20/2021 10:37  Ref. Range 05/20/2021 00:19 05/20/2021 04:11 05/20/2021 07:28  Glucose-Capillary Latest Ref Range: 70 - 99 mg/dL 183 (H)  2 units NOVOLOG  244 (H)  3 units NOVOLOG  239 (H)  3 units NOVOLOG     Home DM Meds: Tresiba 12 units Daily         Humalog 1-10 units TID  Current Orders: Lantus 10 units Daily    Novolog 0-9 units Q4H     MD- Note CBGs >200  Please consider:  1. Increase Lantus slightly to 12 units Daily (home dose)  2. Change Novolog SSi to TID AC + HS  3. Start Novolog Meal Coverage: Novolog 3 units TID with meals Hold if pt eats <50% of meal, Hold if pt NPO     Addendum 12pm--Met w/ pt at bedside this afternoon.  Pt awake and stated she felt much better.  We reviewed her current A1c of 10.8%--pt told me her Last A1c was 10.1% at ENDO office appt and that prior to that her A1c was 13% and has been as high as 16% in the past.  Pt told me she has been working hard with her ENDO to try to get her A1c improved.  Uses Freestyle Libre CGM system for glucose monitoring at home and currently has CGM on L upper arm.  Has All insulin at home and states she takes Tresiba 10 units Daily + Humalog SSI (confirmed SSI that she uses  at home: 2 units for CBG >200, 4 units for CBG >300, 5 units for CBG >500.  Pt did not have any questions for me at this time.  Reviewed current hospital insulin regimen with pt.  Pt told me she has follow up appt with her ENDO on 06/03/2021.  Will follow.    --Will follow patient during hospitalization--  Jeannine Johnston Fishel RN, MSN, CDE Diabetes Coordinator Inpatient Glycemic Control Team Team Pager: [phone] (8a-5p)      

## 2021-05-20 NOTE — Progress Notes (Signed)
D/C instructions given and reviewed. Tele removed. Awaiting DME delivery.

## 2021-05-20 NOTE — Progress Notes (Signed)
Patient states that she does not want IV access replaced until the rounding physician confirms she will be staying for another day. Explained the need for IV access in case of emergency. Patient expresses understanding and continues to defer IV placement at this time.
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
## 2021-05-20 NOTE — Progress Notes (Addendum)
Nutrition Brief Note  RD consulted for assessment of nutritional requirements/ status.   Wt Readings from Last 15 Encounters:  05/20/21 69.4 kg  04/18/21 72.1 kg  03/21/21 72.4 kg  12/04/20 66.3 kg  08/03/20 68.8 kg  06/05/20 66.2 kg  02/19/20 62.1 kg  01/17/20 62.1 kg  12/16/19 59.6 kg  09/07/19 55.3 kg  07/20/19 54.9 kg  07/19/19 56.2 kg  07/12/19 59 kg  06/12/19 63 kg  05/24/19 45.66 kg   65 year old female with a history of chronic kidney disease stage III, diabetes, hypertension, presents to the hospital with 5-day history of nausea, vomiting, diarrhea.  She was found to have hypokalemia, hyponatremia and acute kidney injury.  She was admitted for IV fluids and correction of electrolytes.  Pt admitted with dehydration and hyponatremia.   Reviewed I/O's: +1.2 L x 24 hours and -79 ml since admission  Spoke with pt at bedside, who reports feeling much better today. She reports decreased appetite for 3 days PTA, secondary to diarrhea. She was unable to keep foods and liquids down as "it would come right through me". Prior to acute illness, pt with good appetite, usually consuming 2 meals per day (meat, starch, and vegetable).   Per pt, she endorses a 10# wt loss over the past week. Per pt report, UBW is around 145-150#. Reviewed wt hx; wt has been stable over the past 4 months.  Discussed importance of good meal and supplement intake to promote healing. Per pt, she plans to discharge home today.   Medications reviewed and include ferrous sulfate.   Albumin has a half-life of 21 days and is strongly affected by stress response and inflammatory process, therefore, do not expect to see an improvement in this lab value during acute hospitalization.  When a patient presents with low albumin, it is likely skewed due to the acute inflammatory response.  Unless it is suspected that patient had poor PO intake or malnutrition prior to admission, then RD should not be consulted solely for low  albumin. Note that low albumin is no longer used to diagnose malnutrition; [place] uses the new malnutrition guidelines published by the American Society for Parenteral and Enteral Nutrition (A.S.P.E.N.) and the Academy of Nutrition and Dietetics (AND).     Lab Results  Component Value Date   HGBA1C 10.8 (H) 05/19/2021  PTA DM medications are 1-10 units insulin lispro TID before meals and 12 units insulin degludec daily.   Labs reviewed: Na: 133, CBGS: 183-244 (inpatient orders for glycemic control are 0-9 units insulin aspart every 4 hours and 10 units insulin glargine daily).    Nutrition-Focused physical exam completed. Findings are no fat depletion, no muscle depletion, and no edema.    Current diet order is carb modified, patient is consuming approximately 50-100% of meals at this time. Labs and medications reviewed.   No nutrition interventions warranted at this time. If nutrition issues arise, please consult RD.   Loistine Chance, RD, LDN, [place] Registered Dietitian II Certified Diabetes Care and Education Specialist Please refer to [facility] for RD and/or RD on-call/weekend/after hours pager

## 2021-05-20 NOTE — Plan of Care (Signed)

## 2021-05-20 NOTE — Progress Notes (Signed)
SATURATION QUALIFICATIONS: (This note is used to comply with regulatory documentation for home oxygen)  Patient Saturations on Room Air at Rest = 96%  Patient Saturations on Room Air while Ambulating = 91%  Patient Saturations on 0 Liters of oxygen while Ambulating = 91%  Please briefly explain why patient needs home oxygen: Patient ambulated approximately 100 ft. O2 sats decreased to 91% on room air. No SOB. Patient tolerated well.

## 2021-05-20 NOTE — TOC Initial Note (Signed)
Transition of Care [facility]) - Initial/Assessment Note    Patient Details  Name: Alejandra Hernandez MRN: [id number] Date of Birth: 1956/07/21  Transition of Care [facility]) CM/SW Contact:    Zenon Mayo, RN Phone Number: 05/20/2021, 1:58 PM  Clinical Narrative:                 NCM spoke with patient, she would like to have a rolling walker, she is ok with [employer] supplying this.  NCM made referral to [facility] with [employer].  The walker will be delivered to the patient's room prior to dc.  Expected Discharge Plan: Home/Self Care Barriers to Discharge: No Barriers Identified   Patient Goals and CMS Choice Patient states their goals for this hospitalization and ongoing recovery are:: return home   Choice offered to / list presented to : NA  Expected Discharge Plan and Services Expected Discharge Plan: Home/Self Care In-house Referral: NA Discharge Planning Services: CM Consult Post Acute Care Choice: NA Living arrangements for the past 2 months: Single Family Home Expected Discharge Date: 05/20/21                 DME Agency: NA       HH Arranged: NA          Prior Living Arrangements/Services Living arrangements for the past 2 months: Single Family Home Lives with:: Adult Children Patient language and need for interpreter reviewed:: Yes Do you feel safe going back to the place where you live?: Yes      Need for Family Participation in Patient Care: Yes (Comment) Care giver support system in place?: Yes (comment)   Criminal Activity/Legal Involvement Pertinent to Current Situation/Hospitalization: No - Comment as needed  Activities of Daily Living      Permission Sought/Granted                  Emotional Assessment   Attitude/Demeanor/Rapport: Engaged Affect (typically observed): Appropriate Orientation: : Oriented to Situation, Oriented to  Time, Oriented to Place, Oriented to Self Alcohol / Substance Use: Not Applicable Psych Involvement: No  (comment)  Admission diagnosis:  Hyponatremia [E87.1] Patient Active Problem List   Diagnosis Date Noted   Nausea vomiting and diarrhea 05/19/2021   Dehydration 05/19/2021   Hyponatremia 05/18/2021   Hypomagnesemia 12/27/2020   Abnormal findings on diagnostic imaging of other specified body structures 08/27/2020   Disorder of mineral metabolism, unspecified 08/27/2020   Personal history of other venous thrombosis and embolism 08/27/2020   Pulmonary nodule 08/03/2020   Acidosis 07/06/2020   Complaints of total body pain 07/10/2019   Pain, unspecified 07/10/2019   Left upper extremity swelling 06/03/2019   COPD, mild ([place]) 05/26/2019   Leukocytosis 04/14/2019   Avascular necrosis of humeral head, 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   Hypokalemia 04/14/2019   Neuropathy 04/14/2019   DKA (diabetic ketoacidoses) 04/13/2019   Polyp of transverse colon    Atypical chest pain 11/16/2018   DM (diabetes mellitus), type 2, uncontrolled ([place]) 05/23/2018   CKD (chronic kidney disease), stage III ([place]) 05/14/2018   Hyperglycemia 12/23/2017   Low TSH level 06/22/2017   CAD (coronary artery disease)    Nodule on liver 05/22/2017   Liver disease, unspecified 05/22/2017   Heart palpitations 05/14/2017   Unspecified skin changes 04/18/2017   Tobacco use 03/17/2017   Benign neoplasm of descending colon    Benign neoplasm of sigmoid colon    Chronic diastolic CHF (congestive heart failure) ([place]) 02/09/2017   History of DVT (deep vein thrombosis)  Abnormal CT scan, chest    Acute urinary retention    Claudication ([place]) 07/23/2016   PAD (peripheral artery disease) ([place])    Essential hypertension 05/14/2016   Uncontrolled type 2 diabetes mellitus with ketoacidosis without coma, with long-term current use of insulin (HCC)    GERD (gastroesophageal reflux disease) 05/17/2014   Carotid artery disease ([place]) 05/17/2014   Iron deficiency anemia 04/26/2014   Dyspnea 04/25/2014   Carotid artery bruit 04/25/2014    Hyperlipidemia    Gout    PCP:  Antonietta Jewel, MD Pharmacy:   [facility] [place], [place] [address] Phone: [phone] Fax: [phone]  [id number] - [place], [place] [address] [place] [address] Phone: [phone] Fax: [phone]     Social Determinants of Health (SDOH) Interventions    Readmission Risk Interventions Readmission Risk Prevention Plan 05/20/2021  Transportation Screening Complete  PCP or Specialist Appt within 3-5 Days Complete  HRI or Home Care Consult Complete  Social Work Consult for [place] Planning/Counseling Complete  Palliative Care Screening Not Applicable  Medication Review Press photographer) Complete  Some recent data might be hidden

## 2021-05-20 NOTE — TOC Transition Note (Signed)
Transition of Care [facility]) - CM/SW Discharge Note   Patient Details  Name: SHAI RASMUSSEN MRN: [id number] Date of Birth: 1956/11/17  Transition [facility] Care [facility]) CM/SW Contact:  Zenon Mayo, RN Phone Number: 05/20/2021, 2:09 PM   Clinical Narrative:    For dc today, [employer] will deliver the rolling walker to patient's room prior to dc.   Final next level of care: Home/Self Care Barriers to Discharge: No Barriers Identified   Patient Goals and CMS Choice Patient states their goals for this hospitalization and ongoing recovery are:: return home   Choice offered to / list presented to : NA  Discharge Placement                       Discharge Plan and Services In-house Referral: NA Discharge Planning Services: CM Consult Post Acute Care Choice: NA          DME Arranged: Walker rolling DME Agency: [employer] Date DME Agency Contacted: 05/20/21 Time DME Agency Contacted: 7 Representative spoke with at DME Agency: [place]: NA          Social Determinants of Health ([place]) Interventions     Readmission Risk Interventions Readmission Risk Prevention Plan 05/20/2021  Transportation Screening Complete  PCP or Specialist Appt within 3-5 Days Complete  HRI or [place] Complete  Social Work Consult for [place] Planning/Counseling Complete  Palliative Care Screening Not Applicable  Medication Review Press photographer) Complete  Some recent data might be hidden

## 2021-07-09 ENCOUNTER — Telehealth: Payer: Self-pay | Admitting: Cardiology

## 2021-07-09 NOTE — Telephone Encounter (Signed)
Spoke with the patient who reports that she is so swollen that she can barely walk. She states she is having swelling all over her body. She reports that she has gained 10lbs in one week. She states that she has been taking extra lasix, 80 mg every morning and 60 mg every evening and swelling has not decreased. Patient has a history of CKD and is supposed to be seeing her nephrologist tomorrow morning. Patient does report some SOB but complains mostly of a cough that has gotten worse. I advised the patient that she should go to the ER for evaluation and monitoring. Patient would prefer to wait until her appointment with her kidney doctor tomorrow and be seen by them first. I encouraged her that she should go to the ER now instead of waiting. Patient verbalized understanding.

## 2021-07-09 NOTE — Telephone Encounter (Signed)
Pt c/o swelling: STAT is pt has developed SOB within 24 hours  If swelling, where is the swelling located? All over  How much weight have you gained and in what time span? 10 lbs in about a week  Have you gained 3 pounds in a day or 5 pounds in a week? 10 lbs in about a week  Do you have a log of your daily weights (if so, list)? Does not  8/10 157.9 lbs, today 167 lbs  Are you currently taking a fluid pill? Yes, furosemide half tablet twice a day  Are you currently SOB? no  Have you traveled recently? Not sure   Maudie Mercury, nurse case manager, states she spoke with the patient and is calling in with her symptoms. She states the patient has gained 10 lbs since last week. She says the patient stated she was "swollen all over" and felt so bad she can "barely move". She says the patient did say she was told she could take half a tablet extra for the swelling and she has. She states it was difficult to get information from the patient, because she was very irritable and kept snapping at her when she asked a question. She states she did mention the ED, but the patient did not want to go. She says the patient has had a respiratory infection and is on antibiotics for it. She says the prescription was filled 8/3 so she is not sure if the patient is taking it as prescribed, because she would have finished it. She states the patient does still have a slight cough. She states she was unable to get any further information, but that these are heart failure symptoms. She would like the patient to receive the call back.

## 2021-07-18 ENCOUNTER — Other Ambulatory Visit: Payer: Self-pay

## 2021-07-18 ENCOUNTER — Ambulatory Visit (INDEPENDENT_AMBULATORY_CARE_PROVIDER_SITE_OTHER): Payer: Medicare Other | Admitting: Sports Medicine

## 2021-07-18 ENCOUNTER — Other Ambulatory Visit: Payer: Self-pay | Admitting: Sports Medicine

## 2021-07-18 ENCOUNTER — Encounter: Payer: Self-pay | Admitting: Sports Medicine

## 2021-07-18 DIAGNOSIS — E1165 Type 2 diabetes mellitus with hyperglycemia: Secondary | ICD-10-CM

## 2021-07-18 DIAGNOSIS — M79674 Pain in right toe(s): Secondary | ICD-10-CM

## 2021-07-18 DIAGNOSIS — IMO0002 Reserved for concepts with insufficient information to code with codable children: Secondary | ICD-10-CM

## 2021-07-18 DIAGNOSIS — B351 Tinea unguium: Secondary | ICD-10-CM

## 2021-07-18 DIAGNOSIS — E1140 Type 2 diabetes mellitus with diabetic neuropathy, unspecified: Secondary | ICD-10-CM

## 2021-07-18 DIAGNOSIS — M79675 Pain in left toe(s): Secondary | ICD-10-CM

## 2021-07-18 DIAGNOSIS — I739 Peripheral vascular disease, unspecified: Secondary | ICD-10-CM

## 2021-07-18 MED ORDER — LIDOCAINE 5 % EX PTCH: 1.0000 | MEDICATED_PATCH | CUTANEOUS | 0 refills | Status: DC

## 2021-07-18 NOTE — Progress Notes (Signed)
Subjective: Alejandra Hernandez is a 65 y.o. female patient with history of diabetes who presents to office today complaining of long,mildly painful nails  while ambulating in shoes; unable to trim. Patient states that the glucose readings have been elevated last recording 215 and last A1c was 10.  Patient reports that her last visit to PCP was on 07/05/2021.  No other pedal complaints noted.  Patient Active Problem List   Diagnosis Date Noted   Nausea vomiting and diarrhea 05/19/2021   Dehydration 05/19/2021   Hyponatremia 05/18/2021   Hypomagnesemia 12/27/2020   Abnormal findings on diagnostic imaging of other specified body structures 08/27/2020   Disorder of mineral metabolism, unspecified 08/27/2020   Personal history of other venous thrombosis and embolism 08/27/2020   Pulmonary nodule 08/03/2020   Acidosis 07/06/2020   Complaints of total body pain 07/10/2019   Pain, unspecified 07/10/2019   Left upper extremity swelling 06/03/2019   COPD, mild ([place]) 05/26/2019   Leukocytosis 04/14/2019   Avascular necrosis of humeral head, left ([place]) 04/14/2019   Hypokalemia 04/14/2019   Neuropathy 04/14/2019   DKA (diabetic ketoacidoses) 04/13/2019   Polyp of transverse colon    Atypical chest pain 11/16/2018   DM (diabetes mellitus), type 2, uncontrolled ([place]) 05/23/2018   CKD (chronic kidney disease), stage III ([place]) 05/14/2018   Hyperglycemia 12/23/2017   Low TSH level 06/22/2017   CAD (coronary artery disease)    Nodule on liver 05/22/2017   Liver disease, unspecified 05/22/2017   Heart palpitations 05/14/2017   Unspecified skin changes 04/18/2017   Tobacco use 03/17/2017   Benign neoplasm of descending colon    Benign neoplasm of sigmoid colon    Chronic diastolic CHF (congestive heart failure) ([place]) 02/09/2017   History of DVT (deep vein thrombosis)    Abnormal CT scan, chest    Acute urinary retention    Claudication ([place]) 07/23/2016   PAD (peripheral artery disease) ([place])     Essential hypertension 05/14/2016   Uncontrolled type 2 diabetes mellitus with ketoacidosis without coma, with long-term current use of insulin ([place])    GERD (gastroesophageal reflux disease) 05/17/2014   Carotid artery disease ([place]) 05/17/2014   Iron deficiency anemia 04/26/2014   Dyspnea 04/25/2014   Carotid artery bruit 04/25/2014   Hyperlipidemia    Gout    Current Outpatient Medications on File Prior to Visit  Medication Sig Dispense Refill   acetaminophen (TYLENOL) 325 MG tablet Take 650 mg by mouth 2 (two) times daily as needed for headache or moderate pain.     albuterol (PROAIR HFA) 108 (90 Base) MCG/ACT inhaler Inhale 2 puffs into the lungs every 4 (four) hours as needed for wheezing or shortness of breath. 1 Inhaler 5   amitriptyline (ELAVIL) 25 MG tablet Take 75 mg by mouth at bedtime.      amLODipine (NORVASC) 10 MG tablet Take 10 mg by mouth at bedtime.     amoxicillin (AMOXIL) 500 MG capsule Take 2,000 mg by mouth See admin instructions. 2000 mg prior to dental appointmen     aspirin 81 MG chewable tablet Chew 1 tablet (81 mg total) by mouth daily. 30 tablet 3   atropine 1 % ophthalmic solution Place 1 drop into both eyes 3 (three) times daily as needed (dry eyes).     Blood Glucose Monitoring Suppl (ACCU-CHEK GUIDE ME) w/Device KIT 4 (four) times daily. for testing     Calcium Carbonate Antacid (TUMS PO) Take 2 tablets by mouth 3 (three) times daily as needed (acid  indigestion).      cloNIDine (CATAPRES) 0.1 MG tablet Take 0.1 mg by mouth 2 (two) times daily.     Continuous Blood Gluc Receiver (FREESTYLE LIBRE 14 DAY READER) DEVI Scan as needed.     cyclobenzaprine (FLEXERIL) 10 MG tablet Take 1 tablet (10 mg total) by mouth 2 (two) times daily as needed for muscle spasms. 20 tablet 0   diclofenac Sodium (VOLTAREN) 1 % GEL Apply 1 application topically daily as needed (pain). Apply to feet for pain as needed 150 g 2   Evolocumab (REPATHA SURECLICK) 953 MG/ML SOAJ Inject 1 pen  into the skin every 14 (fourteen) days. 6 mL 3   ezetimibe (ZETIA) 10 MG tablet Take 1 tablet (10 mg total) by mouth daily. 90 tablet 3   ferrous sulfate 325 (65 FE) MG tablet Take 325 mg by mouth daily with breakfast.      furosemide (LASIX) 40 MG tablet Take 1 tablet (40 mg total) by mouth daily. Start on 7/2 90 tablet 3   gabapentin (NEURONTIN) 600 MG tablet Take 0.5 tablets (300 mg total) by mouth 3 (three) times daily. 90 tablet 2   glucose blood (ACCU-CHEK AVIVA PLUS) test strip 1 each by Other route 2 (two) times daily. And lancets 2/day 200 each 3   hydroxypropyl methylcellulose / hypromellose (ISOPTO TEARS / GONIOVISC) 2.5 % ophthalmic solution Place 1 drop into both eyes 3 (three) times daily as needed for dry eyes.     insulin degludec (TRESIBA FLEXTOUCH) 200 UNIT/ML FlexTouch Pen Inject 12 Units into the skin daily. And pen needles 1/day     insulin lispro (HUMALOG) 100 UNIT/ML injection Inject 1-10 Units into the skin 3 (three) times daily before meals. Per sliding scale     isosorbide mononitrate (IMDUR) 120 MG 24 hr tablet Take 1 tablet (120 mg total) by mouth daily. 90 tablet 3   lidocaine-prilocaine (EMLA) cream Apply 1 application topically 4 (four) times daily as needed (back pain).     nitroGLYCERIN (NITROSTAT) 0.4 MG SL tablet PLACE 1 TABLET UNDER THE TONGUE EVERY 5 MINUTES AS NEEDED FOR CHEST PAIN 25 tablet 7   NURTEC 75 MG TBDP Take 75 mg by mouth daily as needed (migraine).     oxyCODONE-acetaminophen (PERCOCET) 10-325 MG tablet Take 1 tablet by mouth 4 (four) times daily as needed for pain.   0   pantoprazole (PROTONIX) 40 MG tablet TAKE 1 TABLET BY MOUTH TWICE DAILY BEFORE A MEAL 180 tablet 0   polyethylene glycol powder (MIRALAX) 17 GM/SCOOP powder Take 17 g by mouth daily as needed for mild constipation.     potassium chloride SA (KLOR-CON) 20 MEQ tablet Take 0.5 tablets (10 mEq total) by mouth daily. Start on 7/2 90 tablet 1   PRESCRIPTION MEDICATION APPLY 1 APPLICATION  TOPICALLY  Cream compounded at [facility] - B5, D3 G10, K10 Pent+ - apply 1 gram (1 gram = 1 pump) to affected area 4 times daily as needed for pain     rivaroxaban (XARELTO) 20 MG TABS tablet Take 1 tablet (20 mg total) by mouth daily with supper. 30 tablet 11   rosuvastatin (CRESTOR) 40 MG tablet Take 1 tablet (40 mg total) by mouth at bedtime.     SPIRIVA RESPIMAT 2.5 MCG/ACT AERS INHALE 2 PUFFS INTO THE LUNGS DAILY 4 g 5   Vitamin D, Ergocalciferol, (DRISDOL) 50000 units CAPS capsule Take 50,000 Units by mouth every Wednesday.   10   No current facility-administered medications on file  prior to visit.   Allergies  Allergen Reactions   Lisinopril Hives   Losartan Other (See Comments)    Pt reports causes GI issues   Sodium Bicarbonate Other (See Comments)    Pt reports causes GI issues      Objective: General: Patient is awake, alert, and oriented x 3 and in no acute distress.  Integument: Skin is warm, dry and supple bilateral. Nails are tender, long, thickened and dystrophic with subungual debris, consistent with onychomycosis, 1-5 bilateral. No signs of infection. Mild callus to bilateral hallux, remaining integument unremarkable.  Vasculature:  Dorsalis Pedis pulse 1/4 bilateral. Posterior Tibial pulse  1/4 bilateral. Capillary fill time <3 sec 1-5 bilateral. Positive hair growth to the level of the digits.Temperature gradient within normal limits.  1+ pitting edema bilateral ankles however easily displaced to be able to lightly palpate the pulses.  Pitting edema is worse on the left as compared to the right.  Neurology: The patient has diminished sensation measured with a 5.07/10g Semmes Weinstein Monofilament at all pedal sites bilateral. Vibratory sensation diminished bilateral with tuning fork. No Babinski sign present bilateral.   Musculoskeletal: Asymptomatic hallux limitus, pes planus, hammertoe pedal deformities noted bilateral. Muscular strength 4/5 in all lower  extremity muscular groups bilateral without pain on range of motion.  Pain with walking calf that goes away with rest concerning for claudication.  No tenderness with calf compression bilateral.  No acute signs of DVT.  Assessment and Plan: Problem List Items Addressed This Visit       Cardiovascular and Mediastinum   PAD (peripheral artery disease) ([place])   Other Visit Diagnoses     Pain due to onychomycosis of toenails of both feet    -  Primary   Type 2 diabetes, uncontrolled, with neuropathy ([place])          -Examined patient. -Re-Discussed and educated patient on diabetic foot care  -Mechanically debrided all nails 1-5 bilateral using sterile nail nipper and filed with dremel without incident  -Advised patient to try Lidoderm patch at the calf to help with lower leg pain -Advised patient to follow-up with her vascular doctor sooner states pain and swelling has progressed -Patient declined x-ray or ultrasound at this time of the left lower extremity and reports that this feels like vascular pain because the pain hurts with cramping when she tries to walk -Patient to return  in 3 months for at risk foot nail care and callus care -Patient advised to call the office if any problems or questions arise in the meantime.  Landis Martins, DPM

## 2021-07-18 NOTE — Telephone Encounter (Signed)
Please advise 

## 2021-07-30 ENCOUNTER — Other Ambulatory Visit: Payer: Self-pay | Admitting: *Deleted

## 2021-07-30 DIAGNOSIS — I739 Peripheral vascular disease, unspecified: Secondary | ICD-10-CM
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
## 2021-08-01 ENCOUNTER — Other Ambulatory Visit: Payer: Self-pay

## 2021-08-01 ENCOUNTER — Other Ambulatory Visit: Payer: Medicare Other | Admitting: *Deleted

## 2021-08-01 ENCOUNTER — Encounter: Payer: Self-pay | Admitting: Cardiology

## 2021-08-01 ENCOUNTER — Emergency Department (HOSPITAL_COMMUNITY): Payer: Medicare Other

## 2021-08-01 ENCOUNTER — Ambulatory Visit (INDEPENDENT_AMBULATORY_CARE_PROVIDER_SITE_OTHER): Payer: Medicare Other | Admitting: Cardiology

## 2021-08-01 ENCOUNTER — Observation Stay (HOSPITAL_COMMUNITY)
Admission: EM | Admit: 2021-08-01 | Discharge: 2021-08-03 | Disposition: A | Discharge status: Home / Self Care | Payer: Medicare Other | Attending: Internal Medicine | Admitting: Internal Medicine

## 2021-08-01 VITALS — BP 150/70 | HR 100 | Ht 63.0 in

## 2021-08-01 DIAGNOSIS — E7800 Pure hypercholesterolemia, unspecified: Secondary | ICD-10-CM | POA: Diagnosis not present

## 2021-08-01 DIAGNOSIS — Z20822 Contact with and (suspected) exposure to covid-19: Secondary | ICD-10-CM | POA: Insufficient documentation

## 2021-08-01 DIAGNOSIS — I130 Hypertensive heart and chronic kidney disease with heart failure and stage 1 through stage 4 chronic kidney disease, or unspecified chronic kidney disease: Secondary | ICD-10-CM | POA: Insufficient documentation

## 2021-08-01 DIAGNOSIS — Z86718 Personal history of other venous thrombosis and embolism: Secondary | ICD-10-CM | POA: Diagnosis not present

## 2021-08-01 DIAGNOSIS — I10 Essential (primary) hypertension: Secondary | ICD-10-CM | POA: Diagnosis present

## 2021-08-01 DIAGNOSIS — R002 Palpitations: Secondary | ICD-10-CM

## 2021-08-01 DIAGNOSIS — E119 Type 2 diabetes mellitus without complications: Secondary | ICD-10-CM | POA: Diagnosis present

## 2021-08-01 DIAGNOSIS — Z7982 Long term (current) use of aspirin: Secondary | ICD-10-CM | POA: Diagnosis not present

## 2021-08-01 DIAGNOSIS — R011 Cardiac murmur, unspecified: Secondary | ICD-10-CM

## 2021-08-01 DIAGNOSIS — N183 Chronic kidney disease, stage 3 unspecified: Secondary | ICD-10-CM | POA: Diagnosis not present

## 2021-08-01 DIAGNOSIS — K219 Gastro-esophageal reflux disease without esophagitis: Secondary | ICD-10-CM | POA: Diagnosis present

## 2021-08-01 DIAGNOSIS — M7989 Other specified soft tissue disorders: Secondary | ICD-10-CM | POA: Diagnosis not present

## 2021-08-01 DIAGNOSIS — E1165 Type 2 diabetes mellitus with hyperglycemia: Secondary | ICD-10-CM | POA: Diagnosis present

## 2021-08-01 DIAGNOSIS — I739 Peripheral vascular disease, unspecified: Secondary | ICD-10-CM

## 2021-08-01 DIAGNOSIS — J449 Chronic obstructive pulmonary disease, unspecified: Secondary | ICD-10-CM | POA: Diagnosis present

## 2021-08-01 DIAGNOSIS — N1831 Chronic kidney disease, stage 3a: Secondary | ICD-10-CM

## 2021-08-01 DIAGNOSIS — Z955 Presence of coronary angioplasty implant and graft: Secondary | ICD-10-CM | POA: Insufficient documentation

## 2021-08-01 DIAGNOSIS — I200 Unstable angina: Secondary | ICD-10-CM

## 2021-08-01 DIAGNOSIS — IMO0002 Reserved for concepts with insufficient information to code with codable children: Secondary | ICD-10-CM | POA: Diagnosis present

## 2021-08-01 DIAGNOSIS — I2583 Coronary atherosclerosis due to lipid rich plaque: Secondary | ICD-10-CM

## 2021-08-01 DIAGNOSIS — I25110 Atherosclerotic heart disease of native coronary artery with unstable angina pectoris: Principal | ICD-10-CM | POA: Insufficient documentation

## 2021-08-01 DIAGNOSIS — Z79899 Other long term (current) drug therapy: Secondary | ICD-10-CM | POA: Diagnosis not present

## 2021-08-01 DIAGNOSIS — Z794 Long term (current) use of insulin: Secondary | ICD-10-CM | POA: Diagnosis not present

## 2021-08-01 DIAGNOSIS — E1022 Type 1 diabetes mellitus with diabetic chronic kidney disease: Secondary | ICD-10-CM | POA: Diagnosis not present

## 2021-08-01 DIAGNOSIS — I2510 Atherosclerotic heart disease of native coronary artery without angina pectoris: Secondary | ICD-10-CM | POA: Diagnosis present

## 2021-08-01 DIAGNOSIS — Z7901 Long term (current) use of anticoagulants: Secondary | ICD-10-CM

## 2021-08-01 DIAGNOSIS — D6859 Other primary thrombophilia: Secondary | ICD-10-CM

## 2021-08-01 DIAGNOSIS — R079 Chest pain, unspecified: Secondary | ICD-10-CM | POA: Diagnosis not present

## 2021-08-01 DIAGNOSIS — I509 Heart failure, unspecified: Secondary | ICD-10-CM | POA: Diagnosis not present

## 2021-08-01 DIAGNOSIS — F17210 Nicotine dependence, cigarettes, uncomplicated: Secondary | ICD-10-CM | POA: Diagnosis not present

## 2021-08-01 DIAGNOSIS — I5032 Chronic diastolic (congestive) heart failure: Secondary | ICD-10-CM | POA: Diagnosis not present

## 2021-08-01 DIAGNOSIS — R0789 Other chest pain: Secondary | ICD-10-CM | POA: Diagnosis present

## 2021-08-01 DIAGNOSIS — I6523 Occlusion and stenosis of bilateral carotid arteries: Secondary | ICD-10-CM

## 2021-08-01 DIAGNOSIS — Z720 Tobacco use: Secondary | ICD-10-CM | POA: Diagnosis present

## 2021-08-01 LAB — TROPONIN I (HIGH SENSITIVITY)
Troponin I (High Sensitivity): 30 ng/L — ABNORMAL HIGH (ref <18)
Troponin I (High Sensitivity): 37 ng/L — ABNORMAL HIGH (ref <18)
Troponin I (High Sensitivity): 41 ng/L — ABNORMAL HIGH (ref <18)

## 2021-08-01 LAB — COMPREHENSIVE METABOLIC PANEL
ALT: 31 U/L (ref 0–44)
AST: 28 U/L (ref 15–41)
Albumin: 3.4 g/dL — ABNORMAL LOW (ref 3.5–5.0)
Alkaline Phosphatase: 115 U/L (ref 38–126)
Anion gap: 13 (ref 5–15)
BUN: 17 mg/dL (ref 8–23)
CO2: 23 mmol/L (ref 22–32)
Calcium: 9.1 mg/dL (ref 8.9–10.3)
Chloride: 97 mmol/L — ABNORMAL LOW (ref 98–111)
Creatinine, Ser: 1.45 mg/dL — ABNORMAL HIGH (ref 0.44–1.00)
GFR, Estimated: 40 mL/min — ABNORMAL LOW (ref >60)
Glucose, Bld: 193 mg/dL — ABNORMAL HIGH (ref 70–99)
Potassium: 3.4 mmol/L — ABNORMAL LOW (ref 3.5–5.1)
Sodium: 133 mmol/L — ABNORMAL LOW (ref 135–145)
Total Bilirubin: 0.4 mg/dL (ref 0.3–1.2)
Total Protein: 7.2 g/dL (ref 6.5–8.1)

## 2021-08-01 LAB — SPECIMEN STATUS

## 2021-08-01 LAB — CBC WITH DIFFERENTIAL/PLATELET
Abs Immature Granulocytes: 0.13 10*3/uL — ABNORMAL HIGH (ref 0.00–0.07)
Basophils Absolute: 0.1 10*3/uL (ref 0.0–0.1)
Basophils Relative: 1 %
Eosinophils Absolute: 0.1 10*3/uL (ref 0.0–0.5)
Eosinophils Relative: 0 %
HCT: 41 % (ref 36.0–46.0)
Hemoglobin: 12.6 g/dL (ref 12.0–15.0)
Immature Granulocytes: 1 %
Lymphocytes Relative: 29 %
Lymphs Abs: 4 10*3/uL (ref 0.7–4.0)
MCH: 21.3 pg — ABNORMAL LOW (ref 26.0–34.0)
MCHC: 30.7 g/dL (ref 30.0–36.0)
MCV: 69.3 fL — ABNORMAL LOW (ref 80.0–100.0)
Monocytes Absolute: 1.1 10*3/uL — ABNORMAL HIGH (ref 0.1–1.0)
Monocytes Relative: 8 %
Neutro Abs: 8.6 10*3/uL — ABNORMAL HIGH (ref 1.7–7.7)
Neutrophils Relative %: 61 %
Platelets: 458 10*3/uL — ABNORMAL HIGH (ref 150–400)
RBC: 5.92 MIL/uL — ABNORMAL HIGH (ref 3.87–5.11)
RDW: 20.3 % — ABNORMAL HIGH (ref 11.5–15.5)
WBC: 14 10*3/uL — ABNORMAL HIGH (ref 4.0–10.5)
nRBC: 0 % (ref 0.0–0.2)

## 2021-08-01 LAB — BRAIN NATRIURETIC PEPTIDE
B Natriuretic Peptide: 73.5 pg/mL (ref 0.0–100.0)
B Natriuretic Peptide: 86.8 pg/mL (ref 0.0–100.0)

## 2021-08-01 MED ORDER — INSULIN GLARGINE-YFGN 100 UNIT/ML ~~LOC~~ SOLN
8.0000 [IU] | Freq: Every day | SUBCUTANEOUS | Status: DC
  Administered 2021-08-02 – 2021-08-03 (×3): 8 [IU] via SUBCUTANEOUS
  Filled 2021-08-01 (×3): qty 0.08

## 2021-08-01 MED ORDER — POTASSIUM CHLORIDE CRYS ER 20 MEQ PO TBCR
40.0000 meq | EXTENDED_RELEASE_TABLET | Freq: Once | ORAL | Status: AC
  Administered 2021-08-02: 40 meq via ORAL
  Filled 2021-08-01: qty 4
  Filled 2021-08-01: qty 2

## 2021-08-01 MED ORDER — NITROGLYCERIN IN D5W 200-5 MCG/ML-% IV SOLN
0.0000 ug/min | INTRAVENOUS | Status: DC
  Administered 2021-08-01: 5 ug/min via INTRAVENOUS
  Administered 2021-08-02: 80 ug/min via INTRAVENOUS
  Filled 2021-08-01 (×2): qty 250

## 2021-08-01 MED ORDER — EZETIMIBE 10 MG PO TABS
10.0000 mg | ORAL_TABLET | Freq: Every day | ORAL | Status: DC
  Administered 2021-08-02 – 2021-08-03 (×3): 10 mg via ORAL
  Filled 2021-08-01 (×3): qty 1

## 2021-08-01 MED ORDER — ROSUVASTATIN CALCIUM 20 MG PO TABS
40.0000 mg | ORAL_TABLET | Freq: Every day | ORAL | Status: DC
  Administered 2021-08-02: 40 mg via ORAL
  Filled 2021-08-01: qty 2

## 2021-08-01 MED ORDER — ASPIRIN 81 MG PO CHEW
81.0000 mg | CHEWABLE_TABLET | Freq: Every day | ORAL | Status: DC
  Administered 2021-08-02 – 2021-08-03 (×3): 81 mg via ORAL
  Filled 2021-08-01 (×3): qty 1

## 2021-08-01 MED ORDER — GABAPENTIN 600 MG PO TABS
300.0000 mg | ORAL_TABLET | Freq: Three times a day (TID) | ORAL | Status: DC
  Administered 2021-08-02 – 2021-08-03 (×4): 300 mg via ORAL
  Filled 2021-08-01: qty 0.5
  Filled 2021-08-01 (×4): qty 1

## 2021-08-01 MED ORDER — CARVEDILOL 6.25 MG PO TABS
6.2500 mg | ORAL_TABLET | Freq: Two times a day (BID) | ORAL | Status: DC
  Administered 2021-08-02 – 2021-08-03 (×4): 6.25 mg via ORAL
  Filled 2021-08-01: qty 2
  Filled 2021-08-01 (×3): qty 1

## 2021-08-01 MED ORDER — ACETAMINOPHEN 325 MG PO TABS: 650.0000 mg | ORAL_TABLET | ORAL | Status: DC | PRN

## 2021-08-01 MED ORDER — ENOXAPARIN SODIUM 80 MG/0.8ML IJ SOSY
70.0000 mg | PREFILLED_SYRINGE | Freq: Two times a day (BID) | INTRAMUSCULAR | Status: DC
  Administered 2021-08-02 (×2): 70 mg via SUBCUTANEOUS
  Filled 2021-08-01: qty 0.8
  Filled 2021-08-01: qty 0.7

## 2021-08-01 MED ORDER — ISOSORBIDE MONONITRATE ER 60 MG PO TB24
120.0000 mg | ORAL_TABLET | Freq: Every day | ORAL | Status: DC
  Administered 2021-08-02 – 2021-08-03 (×2): 120 mg via ORAL
  Filled 2021-08-01 (×2): qty 2
  Filled 2021-08-01: qty 4

## 2021-08-01 MED ORDER — ONDANSETRON HCL 4 MG/2ML IJ SOLN: 4.0000 mg | Freq: Four times a day (QID) | INTRAMUSCULAR | Status: DC | PRN

## 2021-08-01 MED ORDER — FENTANYL CITRATE PF 50 MCG/ML IJ SOSY
50.0000 ug | PREFILLED_SYRINGE | Freq: Once | INTRAMUSCULAR | Status: AC
  Administered 2021-08-01: 50 ug via INTRAVENOUS
  Filled 2021-08-01: qty 1

## 2021-08-01 MED ORDER — NITROGLYCERIN 0.4 MG SL SUBL
0.4000 mg | SUBLINGUAL_TABLET | SUBLINGUAL | Status: DC | PRN
  Administered 2021-08-01: 0.4 mg via SUBLINGUAL
  Filled 2021-08-01: qty 1

## 2021-08-01 MED ORDER — ENOXAPARIN SODIUM 40 MG/0.4ML IJ SOSY: 40.0000 mg | PREFILLED_SYRINGE | INTRAMUSCULAR | Status: DC

## 2021-08-01 MED ORDER — ATROPINE SULFATE 1 % OP SOLN: 1.0000 [drp] | Freq: Three times a day (TID) | OPHTHALMIC | Status: DC | PRN

## 2021-08-01 MED ORDER — AMITRIPTYLINE HCL 25 MG PO TABS
75.0000 mg | ORAL_TABLET | Freq: Every day | ORAL | Status: DC
  Administered 2021-08-02: 75 mg via ORAL
  Filled 2021-08-01 (×2): qty 3
  Filled 2021-08-01: qty 1

## 2021-08-01 MED ORDER — FUROSEMIDE 10 MG/ML IJ SOLN
60.0000 mg | Freq: Once | INTRAMUSCULAR | Status: AC
  Administered 2021-08-02: 60 mg via INTRAVENOUS
  Filled 2021-08-01: qty 6

## 2021-08-01 MED ORDER — POTASSIUM CHLORIDE CRYS ER 10 MEQ PO TBCR
10.0000 meq | EXTENDED_RELEASE_TABLET | Freq: Every day | ORAL | Status: DC
  Administered 2021-08-02 – 2021-08-03 (×2): 10 meq via ORAL
  Filled 2021-08-01 (×2): qty 1

## 2021-08-01 MED ORDER — OXYCODONE-ACETAMINOPHEN 5-325 MG PO TABS
1.0000 | ORAL_TABLET | ORAL | Status: DC | PRN
  Administered 2021-08-02 (×4): 1 via ORAL
  Filled 2021-08-01 (×4): qty 1

## 2021-08-01 MED ORDER — NITROGLYCERIN 0.4 MG SL SUBL: 0.4000 mg | SUBLINGUAL_TABLET | SUBLINGUAL | Status: DC | PRN

## 2021-08-01 MED ORDER — OXYCODONE-ACETAMINOPHEN 10-325 MG PO TABS: 1.0000 | ORAL_TABLET | Freq: Four times a day (QID) | ORAL | Status: DC | PRN

## 2021-08-01 MED ORDER — FERROUS SULFATE 325 (65 FE) MG PO TABS
325.0000 mg | ORAL_TABLET | Freq: Every day | ORAL | Status: DC
  Administered 2021-08-02 – 2021-08-03 (×2): 325 mg via ORAL
  Filled 2021-08-01 (×2): qty 1

## 2021-08-01 MED ORDER — PANTOPRAZOLE SODIUM 40 MG PO TBEC
40.0000 mg | DELAYED_RELEASE_TABLET | Freq: Two times a day (BID) | ORAL | Status: DC
  Administered 2021-08-02 – 2021-08-03 (×3): 40 mg via ORAL
  Filled 2021-08-01 (×3): qty 1

## 2021-08-01 MED ORDER — AMLODIPINE BESYLATE 10 MG PO TABS
10.0000 mg | ORAL_TABLET | Freq: Every day | ORAL | Status: DC
  Administered 2021-08-02 (×2): 10 mg via ORAL
  Filled 2021-08-01: qty 1
  Filled 2021-08-01: qty 2

## 2021-08-01 MED ORDER — OXYCODONE HCL 5 MG PO TABS
5.0000 mg | ORAL_TABLET | ORAL | Status: DC | PRN
  Administered 2021-08-02 – 2021-08-03 (×5): 5 mg via ORAL
  Filled 2021-08-01 (×5): qty 1

## 2021-08-01 MED ORDER — CLONIDINE HCL 0.1 MG PO TABS
0.1000 mg | ORAL_TABLET | Freq: Two times a day (BID) | ORAL | Status: DC
  Administered 2021-08-02 – 2021-08-03 (×4): 0.1 mg via ORAL
  Filled 2021-08-01 (×4): qty 1

## 2021-08-01 MED ORDER — CYCLOBENZAPRINE HCL 10 MG PO TABS
10.0000 mg | ORAL_TABLET | Freq: Two times a day (BID) | ORAL | Status: DC | PRN
  Administered 2021-08-02: 10 mg via ORAL
  Filled 2021-08-01: qty 1

## 2021-08-01 NOTE — ED Provider Notes (Signed)
Emergency Medicine Provider Triage Evaluation Note  Alejandra Hernandez , a 65 y.o. female  was evaluated in triage.  Pt complains of chest pain.  Comes and goes, associated with diaphoresis, nausea, vomiting.  It does radiate to her back, not to her arm.  History of prior heart attacks, seen today by cardiology who advised to go to the ED for additional work-up.  She is on Xarelto, missed a few doses last week for dental procedure.  She is also complaining of left leg swelling for the past 3 days, no pain in the back of the calf or color changes.  She does take furosemide for edema.  Review of Systems  Positive: CP, leg swelling Negative:   Physical Exam  BP (!) 187/77   Pulse 97   Temp 97.6 F (36.4 C) (Oral)   Resp 17   LMP  (LMP Unknown)   SpO2 97%  Gen:   Awake, no distress   Resp:  Normal effort  MSK:   Moves extremities without difficulty  Other:  2+ pitting edema left ankle.  DP and PT 2+.  Cap refill less than 2  Medical Decision Making  Medically screening exam initiated at 10:50 AM.  Appropriate orders placed.  SHABNAM SHILLINGFORD was informed that the remainder of the evaluation will be completed by another provider, this initial triage assessment does not replace that evaluation, and the importance of remaining in the ED until their evaluation is complete.  Will initiate chest pain work-up.  Low suspicion for DVT, legs are roughly symmetric although there is more edema on the left.     Sherrill Raring, PA-C 08/01/21 1052    Tegeler, Gwenyth Allegra, MD 08/01/21 [phone]

## 2021-08-01 NOTE — Patient Instructions (Signed)
Medication Instructions:  Your physician recommends that you continue on your current medications as directed. Please refer to the Current Medication list given to you today.  *If you need a refill on your cardiac medications before your next appointment, please call your pharmacy*   Follow-Up: At [facility], you and your health needs are our priority.  As part of our continuing mission to provide you with exceptional heart care, we have created designated Provider Care Teams.  These Care Teams include your primary Cardiologist (physician) and Advanced Practice Providers (APPs -  Physician Assistants and Nurse Practitioners) who all work together to provide you with the care you need, when you need it.  Please go directly to [facility] ER for evaluation.
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
## 2021-08-01 NOTE — ED Notes (Signed)
Pt not complaining of chest pain at this time.   A little chest pressure but nothing she wants to put a number to
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
## 2021-08-01 NOTE — ED Triage Notes (Signed)
C/o  chest pain and left leg swelling for a while. Reported recent changes w/ her torsemide prescription.

## 2021-08-02 ENCOUNTER — Ambulatory Visit (HOSPITAL_COMMUNITY)
Admission: EM | Disposition: A | Discharge status: Home / Self Care | Payer: Self-pay | Source: Home / Self Care | Attending: Emergency Medicine

## 2021-08-02 ENCOUNTER — Observation Stay (HOSPITAL_BASED_OUTPATIENT_CLINIC_OR_DEPARTMENT_OTHER): Payer: Medicare Other

## 2021-08-02 DIAGNOSIS — R072 Precordial pain: Secondary | ICD-10-CM | POA: Diagnosis not present

## 2021-08-02 DIAGNOSIS — I2510 Atherosclerotic heart disease of native coronary artery without angina pectoris: Secondary | ICD-10-CM

## 2021-08-02 DIAGNOSIS — I25110 Atherosclerotic heart disease of native coronary artery with unstable angina pectoris: Secondary | ICD-10-CM | POA: Diagnosis not present

## 2021-08-02 HISTORY — PX: LEFT HEART CATH AND CORONARY ANGIOGRAPHY: CATH118249

## 2021-08-02 LAB — CBC
HCT: 35.5 % — ABNORMAL LOW (ref 36.0–46.0)
Hemoglobin: 11 g/dL — ABNORMAL LOW (ref 12.0–15.0)
MCH: 21.1 pg — ABNORMAL LOW (ref 26.0–34.0)
MCHC: 31 g/dL (ref 30.0–36.0)
MCV: 68.1 fL — ABNORMAL LOW (ref 80.0–100.0)
Platelets: 432 10*3/uL — ABNORMAL HIGH (ref 150–400)
RBC: 5.21 MIL/uL — ABNORMAL HIGH (ref 3.87–5.11)
RDW: 19.5 % — ABNORMAL HIGH (ref 11.5–15.5)
WBC: 12.9 10*3/uL — ABNORMAL HIGH (ref 4.0–10.5)
nRBC: 0 % (ref 0.0–0.2)

## 2021-08-02 LAB — BASIC METABOLIC PANEL
Anion gap: 10 (ref 5–15)
BUN: 15 mg/dL (ref 8–23)
CO2: 21 mmol/L — ABNORMAL LOW (ref 22–32)
Calcium: 8.6 mg/dL — ABNORMAL LOW (ref 8.9–10.3)
Chloride: 100 mmol/L (ref 98–111)
Creatinine, Ser: 1.37 mg/dL — ABNORMAL HIGH (ref 0.44–1.00)
GFR, Estimated: 43 mL/min — ABNORMAL LOW (ref >60)
Glucose, Bld: 352 mg/dL — ABNORMAL HIGH (ref 70–99)
Potassium: 4.3 mmol/L (ref 3.5–5.1)
Sodium: 131 mmol/L — ABNORMAL LOW (ref 135–145)

## 2021-08-02 LAB — ECHOCARDIOGRAM COMPLETE
AR max vel: 1.85 cm2
AV Area VTI: 1.93 cm2
AV Area mean vel: 1.75 cm2
AV Mean grad: 6 mmHg
AV Peak grad: 8.5 mmHg
Ao pk vel: 1.46 m/s
Area-P 1/2: 2.42 cm2
Height: 63 in
S' Lateral: 2.7 cm
Single Plane A4C EF: 74.4 %
Weight: 2395.2 oz

## 2021-08-02 LAB — HEMOGLOBIN A1C
Hgb A1c MFr Bld: 10.9 % — ABNORMAL HIGH (ref 4.8–5.6)
Mean Plasma Glucose: 266.13 mg/dL

## 2021-08-02 LAB — GLUCOSE, CAPILLARY
Glucose-Capillary: 304 mg/dL — ABNORMAL HIGH (ref 70–99)
Glucose-Capillary: 293 mg/dL — ABNORMAL HIGH (ref 70–99)
Glucose-Capillary: 68 mg/dL — ABNORMAL LOW (ref 70–99)
Glucose-Capillary: 344 mg/dL — ABNORMAL HIGH (ref 70–99)

## 2021-08-02 LAB — SARS CORONAVIRUS 2 (TAT 6-24 HRS): SARS Coronavirus 2: NEGATIVE

## 2021-08-02 LAB — CBG MONITORING, ED: Glucose-Capillary: 274 mg/dL — ABNORMAL HIGH (ref 70–99)

## 2021-08-02 LAB — MAGNESIUM: Magnesium: 1.4 mg/dL — ABNORMAL LOW (ref 1.7–2.4)

## 2021-08-02 SURGERY — LEFT HEART CATH AND CORONARY ANGIOGRAPHY
Anesthesia: LOCAL

## 2021-08-02 MED ORDER — MIDAZOLAM HCL 2 MG/2ML IJ SOLN
INTRAMUSCULAR | Status: AC
  Filled 2021-08-02: qty 2

## 2021-08-02 MED ORDER — INSULIN ASPART 100 UNIT/ML IJ SOLN
0.0000 [IU] | Freq: Three times a day (TID) | INTRAMUSCULAR | Status: DC
  Administered 2021-08-02: 8 [IU] via SUBCUTANEOUS
  Administered 2021-08-03: 3 [IU] via SUBCUTANEOUS
  Administered 2021-08-03: 5 [IU] via SUBCUTANEOUS

## 2021-08-02 MED ORDER — HEPARIN SODIUM (PORCINE) 1000 UNIT/ML IJ SOLN
INTRAMUSCULAR | Status: AC
  Filled 2021-08-02: qty 1

## 2021-08-02 MED ORDER — LIDOCAINE HCL (PF) 1 % IJ SOLN
INTRAMUSCULAR | Status: AC
  Filled 2021-08-02: qty 30

## 2021-08-02 MED ORDER — SODIUM CHLORIDE 0.9% FLUSH: 3.0000 mL | INTRAVENOUS | Status: DC | PRN

## 2021-08-02 MED ORDER — FENTANYL CITRATE (PF) 100 MCG/2ML IJ SOLN
INTRAMUSCULAR | Status: DC | PRN
  Administered 2021-08-02: 25 ug via INTRAVENOUS

## 2021-08-02 MED ORDER — IOHEXOL 350 MG/ML SOLN
INTRAVENOUS | Status: DC | PRN
  Administered 2021-08-02: 55 mL

## 2021-08-02 MED ORDER — HEPARIN (PORCINE) IN NACL 1000-0.9 UT/500ML-% IV SOLN
INTRAVENOUS | Status: AC
  Filled 2021-08-02: qty 1000

## 2021-08-02 MED ORDER — FENTANYL CITRATE (PF) 100 MCG/2ML IJ SOLN
INTRAMUSCULAR | Status: AC
  Filled 2021-08-02: qty 2

## 2021-08-02 MED ORDER — POLYVINYL ALCOHOL 1.4 % OP SOLN: 1.0000 [drp] | OPHTHALMIC | Status: DC | PRN

## 2021-08-02 MED ORDER — SODIUM CHLORIDE 0.9 % IV SOLN: INTRAVENOUS | Status: DC

## 2021-08-02 MED ORDER — SODIUM CHLORIDE 0.9 % IV SOLN: 250.0000 mL | INTRAVENOUS | Status: DC | PRN

## 2021-08-02 MED ORDER — SODIUM CHLORIDE 0.9% FLUSH
3.0000 mL | Freq: Two times a day (BID) | INTRAVENOUS | Status: DC
  Administered 2021-08-02: 3 mL via INTRAVENOUS

## 2021-08-02 MED ORDER — MIDAZOLAM HCL 2 MG/2ML IJ SOLN
INTRAMUSCULAR | Status: DC | PRN
  Administered 2021-08-02: 1 mg via INTRAVENOUS

## 2021-08-02 MED ORDER — SODIUM CHLORIDE 0.9% FLUSH
3.0000 mL | Freq: Two times a day (BID) | INTRAVENOUS | Status: DC
  Administered 2021-08-03 (×2): 3 mL via INTRAVENOUS

## 2021-08-02 MED ORDER — HEPARIN (PORCINE) IN NACL 1000-0.9 UT/500ML-% IV SOLN
INTRAVENOUS | Status: DC | PRN
  Administered 2021-08-02 (×2): 500 mL

## 2021-08-02 MED ORDER — SODIUM CHLORIDE 0.9 % WEIGHT BASED INFUSION: 1.0000 mL/kg/h | INTRAVENOUS | Status: AC

## 2021-08-02 MED ORDER — LIDOCAINE HCL (PF) 1 % IJ SOLN
INTRAMUSCULAR | Status: DC | PRN
  Administered 2021-08-02: 15 mL

## 2021-08-02 MED ORDER — VERAPAMIL HCL 2.5 MG/ML IV SOLN
INTRAVENOUS | Status: AC
  Filled 2021-08-02: qty 2

## 2021-08-02 SURGICAL SUPPLY — 13 items
CATH INFINITI 5FR MULTPACK ANG (CATHETERS) ×2 IMPLANT
CATH INFINITI 6F ANG MULTIPACK (CATHETERS) IMPLANT
CLOSURE MYNX CONTROL 5F (Vascular Products) ×2 IMPLANT
GLIDESHEATH SLEND SS 6F .021 (SHEATH) ×2 IMPLANT
GUIDEWIRE INQWIRE 1.5J.035X260 (WIRE) IMPLANT
INQWIRE 1.5J .035X260CM (WIRE)
KIT HEART LEFT (KITS) ×2 IMPLANT
PACK CARDIAC CATHETERIZATION (CUSTOM PROCEDURE TRAY) ×2 IMPLANT
SHEATH PINNACLE 5F 10CM (SHEATH) ×2 IMPLANT
SHEATH PROBE COVER 6X72 (BAG) ×2 IMPLANT
TRANSDUCER W/STOPCOCK (MISCELLANEOUS) ×2 IMPLANT
TUBING CIL FLEX 10 FLL-RA (TUBING) ×2 IMPLANT
WIRE EMERALD 3MM-J .035X260CM (WIRE) ×2 IMPLANT

## 2021-08-02 NOTE — Discharge Instructions (Signed)
Local Endocrinologists [facility] [phone]) Dr. Philemon Kingdom Dr. Elayne Snare [facility] [phone]) Dr. Jacelyn Pi Dr. Anda Kraft [facility] [phone]) Dr. Daneil Dolin [facility] [phone]) [place] office]  [phone]) Shari Prows office] Dr. Lavone Orn Dr. Mee Hives [facility]) [phone]) Autumn Barbaraann Barthel Ronnald Ramp), PA Dr. Amalia Greenhouse Dr. Marsh Dolly. Dr. Carolynn Serve. [place] in [place] [phone])

## 2021-08-02 NOTE — Interval H&P Note (Signed)
History and Physical Interval Note:  08/02/2021 3:37 PM  Alejandra Hernandez  has presented today for surgery, with the diagnosis of unstable angina.  The various methods of treatment have been discussed with the patient and family. After consideration of risks, benefits and other options for treatment, the patient has consented to  Procedure(s): LEFT HEART CATH AND CORONARY ANGIOGRAPHY (N/A) as a surgical intervention.  The patient's history has been reviewed, patient examined, no change in status, stable for surgery.  I have reviewed the patient's chart and labs.  Questions were answered to the patient's satisfaction.    Cath Lab Visit (complete for each Cath Lab visit)  Clinical Evaluation Leading to the Procedure:   ACS: Yes.    Non-ACS:    Anginal Classification: CCS III  Anti-ischemic medical therapy: Maximal Therapy (2 or more classes of medications)  Non-Invasive Test Results: No non-invasive testing performed  Prior CABG: Previous CABG       Alejandra Hernandez [facility] 08/02/2021 3:38 PM

## 2021-08-02 NOTE — Progress Notes (Signed)
Cath lab picked pt up at 1515, RN escorted pt down to cath lab due to pt on IV NTG. No chest pain.  MD and cath lab aware pt ate lunch at 1200, then made NPO for possible cath.

## 2021-08-02 NOTE — Progress Notes (Signed)
Pt has 344 CBG. Pt had late dinner as she came to unit in the afternoon from cath lab.no coverage schedule.
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
## 2021-08-02 NOTE — Progress Notes (Signed)
Patient states that she has been "passing out" recently. Cannot identify a precipitating activity. States that she "gets night sweats, starts shaking real bad", and wakes up without incident.

## 2021-08-02 NOTE — Progress Notes (Addendum)
Inpatient Diabetes Program Recommendations  AACE/ADA: New Consensus Statement on Inpatient Glycemic Control (2015)  Target Ranges:  Prepandial:   less than 140 mg/dL      Peak postprandial:   less than 180 mg/dL (1-2 hours)      Critically ill patients:  140 - 180 mg/dL   Lab Results  Component Value Date   GLUCAP 304 (H) 08/02/2021   HGBA1C 10.9 (H) 08/02/2021    Review of Glycemic Control Results for Alejandra Hernandez, Alejandra Hernandez (MRN [id number]) as of 08/02/2021 10:06  Ref. Range 05/20/2021 07:28 05/20/2021 11:36 08/02/2021 01:09 08/02/2021 06:20  Glucose-Capillary Latest Ref Range: 70 - 99 mg/dL 239 (H) 233 (H) 274 (H) 304 (H)   Diabetes history: Type 2 DM Outpatient Diabetes medications: Tresiba 11 units QD, Humalog 4-6 units TID,  Current orders for Inpatient glycemic control: Semglee 8 units QD, Novolog 0-15 units TID  Inpatient Diabetes Program Recommendations:     Addendum:   Spoke with patient at bedside and verified home medications. Very defensive when asked questions related to diabetes. States, "I will refuse my insulin. I aint having lows here." Reviewed patient's current A1c of 10.9%. Explained what a A1c is and what it measures. Also reviewed goal A1c with patient, importance of good glucose control @ home, and blood sugar goals. Patient aware of impact of steroids and feels that this may be responsible for increase. Patient is followed by Gery Pray, outpatient endocrinology, and states, "Yeah she wants me to go up to [place] 16 units but I told her I would only do it if I felt comfortable. I don't want you to make me fall out." Verified patient has multiple lows in a week and usually awareness begins in the 120's mg/dL. Discussed hypoglycemia awareness.  Education provided to patient and encouragement given for insulin adjustment given blood sugars are elevated >300 mg/dL, without eating, and with current doses feel patient could benefit from an increase. In agreement to increase Semglee  to 10 units QD.  Consider increasing Semglee to 10 units QD (patient's home dose), add Novolog 3 units TID (assuming is consuming >50% of meals) and adding carb modified to diet.    Thanks, Bronson Curb, MSN, RNC-OB Diabetes Coordinator [phone] (8a-5p)
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
## 2021-08-03 ENCOUNTER — Encounter (HOSPITAL_COMMUNITY): Payer: Self-pay | Admitting: Internal Medicine

## 2021-08-03 DIAGNOSIS — D6859 Other primary thrombophilia: Secondary | ICD-10-CM

## 2021-08-03 DIAGNOSIS — I200 Unstable angina: Secondary | ICD-10-CM | POA: Diagnosis not present

## 2021-08-03 DIAGNOSIS — Z7901 Long term (current) use of anticoagulants: Secondary | ICD-10-CM

## 2021-08-03 DIAGNOSIS — I25110 Atherosclerotic heart disease of native coronary artery with unstable angina pectoris: Secondary | ICD-10-CM | POA: Diagnosis not present

## 2021-08-03 LAB — BASIC METABOLIC PANEL
Anion gap: 8 (ref 5–15)
BUN: 21 mg/dL (ref 8–23)
CO2: 22 mmol/L (ref 22–32)
Calcium: 8.5 mg/dL — ABNORMAL LOW (ref 8.9–10.3)
Chloride: 102 mmol/L (ref 98–111)
Creatinine, Ser: 1.67 mg/dL — ABNORMAL HIGH (ref 0.44–1.00)
GFR, Estimated: 34 mL/min — ABNORMAL LOW (ref >60)
Glucose, Bld: 230 mg/dL — ABNORMAL HIGH (ref 70–99)
Potassium: 4.2 mmol/L (ref 3.5–5.1)
Sodium: 132 mmol/L — ABNORMAL LOW (ref 135–145)

## 2021-08-03 LAB — CBC
HCT: 32.1 % — ABNORMAL LOW (ref 36.0–46.0)
Hemoglobin: 9.8 g/dL — ABNORMAL LOW (ref 12.0–15.0)
MCH: 21 pg — ABNORMAL LOW (ref 26.0–34.0)
MCHC: 30.5 g/dL (ref 30.0–36.0)
MCV: 68.7 fL — ABNORMAL LOW (ref 80.0–100.0)
Platelets: 363 10*3/uL (ref 150–400)
RBC: 4.67 MIL/uL (ref 3.87–5.11)
RDW: 19.4 % — ABNORMAL HIGH (ref 11.5–15.5)
WBC: 11.1 10*3/uL — ABNORMAL HIGH (ref 4.0–10.5)
nRBC: 0 % (ref 0.0–0.2)

## 2021-08-03 LAB — GLUCOSE, CAPILLARY
Glucose-Capillary: 203 mg/dL — ABNORMAL HIGH (ref 70–99)
Glucose-Capillary: 179 mg/dL — ABNORMAL HIGH (ref 70–99)

## 2021-08-03 LAB — MAGNESIUM: Magnesium: 1.6 mg/dL — ABNORMAL LOW (ref 1.7–2.4)

## 2021-08-03 MED ORDER — CARVEDILOL 6.25 MG PO TABS: 6.2500 mg | ORAL_TABLET | Freq: Two times a day (BID) | ORAL | 3 refills | Status: DC

## 2021-08-03 MED ORDER — TORSEMIDE 20 MG PO TABS: 40.0000 mg | ORAL_TABLET | Freq: Two times a day (BID) | ORAL | Status: DC

## 2021-08-03 MED ORDER — ASPIRIN EC 81 MG PO TBEC: 81.0000 mg | DELAYED_RELEASE_TABLET | Freq: Every day | ORAL | 2 refills | Status: DC

## 2021-08-03 NOTE — Progress Notes (Signed)
Discharge instructions reviewed with pt. Pt verbalizes understanding and states she has no questions. Pt states she will call an Melburn Popper to drive her home. Will notify secretary when Melburn Popper is here so she can be discharged in wheelchair. Claiborne Billings, RN notified.
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
## 2021-08-05 ENCOUNTER — Encounter (HOSPITAL_COMMUNITY): Payer: Self-pay | Admitting: Cardiology

## 2021-08-05 MED FILL — Verapamil HCl IV Soln 2.5 MG/ML: INTRAVENOUS | Qty: 2 | Status: AC

## 2021-08-06 LAB — LIPID PANEL
Chol/HDL Ratio: 1.7 ratio (ref 0.0–4.4)
Cholesterol, Total: 192 mg/dL (ref 100–199)
HDL: 116 mg/dL (ref >39)
LDL Chol Calc (NIH): 62 mg/dL (ref 0–99)
Triglycerides: 81 mg/dL (ref 0–149)
VLDL Cholesterol Cal: 14 mg/dL (ref 5–40)

## 2021-08-06 LAB — HEPATIC FUNCTION PANEL
ALT: 26 IU/L (ref 0–32)
AST: 22 IU/L (ref 0–40)
Albumin: 3.6 g/dL — ABNORMAL LOW (ref 3.8–4.8)
Alkaline Phosphatase: 133 IU/L — ABNORMAL HIGH (ref 44–121)
Bilirubin Total: 0.3 mg/dL (ref 0.0–1.2)
Bilirubin, Direct: 0.12 mg/dL (ref 0.00–0.40)
Total Protein: 6.2 g/dL (ref 6.0–8.5)

## 2021-08-06 LAB — SPECIMEN STATUS REPORT

## 2021-08-07 ENCOUNTER — Other Ambulatory Visit: Payer: Self-pay

## 2021-08-07 ENCOUNTER — Ambulatory Visit (HOSPITAL_COMMUNITY)
Admission: RE | Admit: 2021-08-07 | Discharge: 2021-08-07 | Disposition: A | Discharge status: Home / Self Care | Payer: Medicare Other | Source: Ambulatory Visit | Attending: Cardiovascular Disease | Admitting: Cardiovascular Disease

## 2021-08-07 ENCOUNTER — Other Ambulatory Visit: Payer: Self-pay | Admitting: Cardiovascular Disease

## 2021-08-07 DIAGNOSIS — I739 Peripheral vascular disease, unspecified: Secondary | ICD-10-CM | POA: Diagnosis not present

## 2021-08-08 ENCOUNTER — Telehealth: Payer: Self-pay | Admitting: *Deleted

## 2021-08-08 DIAGNOSIS — I2583 Coronary atherosclerosis due to lipid rich plaque: Secondary | ICD-10-CM

## 2021-08-08 DIAGNOSIS — I10 Essential (primary) hypertension: Secondary | ICD-10-CM

## 2021-08-08 DIAGNOSIS — I2510 Atherosclerotic heart disease of native coronary artery without angina pectoris: Secondary | ICD-10-CM

## 2021-08-08 NOTE — Telephone Encounter (Signed)
Pt is returning call from this morning. Please advise

## 2021-08-08 NOTE — Telephone Encounter (Signed)
Lm to call back ./cy 

## 2021-08-08 NOTE — Telephone Encounter (Signed)
-----   Message from Ledora Bottcher, [place] sent at 08/03/2021 11:51 AM EDT ----- Pt had cath yesterday without intervention. Wondering if we can move up her appt- has one with Scott in about a month. If not, just keep Scott's appt.   Also, she needs a BMP on Tues, results to go to Dr. Radford Pax. Can you please help arrange the lab and let her know?  Thank you! Angie

## 2021-08-08 NOTE — Telephone Encounter (Signed)
Patient is going to try to get here by Tuesday of next week. Patient has to depend on transportation and they usually need 3 business days. Scheduled patient for Wednesday to be on the safe side. Patient will call back if she can do Tuesday and then appointment can be moved. Patient verbalized understanding.

## 2021-08-13 ENCOUNTER — Other Ambulatory Visit: Payer: Self-pay

## 2021-08-13 ENCOUNTER — Other Ambulatory Visit: Payer: Medicare Other

## 2021-08-13 DIAGNOSIS — I2510 Atherosclerotic heart disease of native coronary artery without angina pectoris: Secondary | ICD-10-CM

## 2021-08-13 LAB — BASIC METABOLIC PANEL
BUN/Creatinine Ratio: 11 — ABNORMAL LOW (ref 12–28)
BUN: 16 mg/dL (ref 8–27)
CO2: 22 mmol/L (ref 20–29)
Calcium: 8.2 mg/dL — ABNORMAL LOW (ref 8.7–10.3)
Chloride: 89 mmol/L — ABNORMAL LOW (ref 96–106)
Creatinine, Ser: 1.41 mg/dL — ABNORMAL HIGH (ref 0.57–1.00)
Glucose: 185 mg/dL — ABNORMAL HIGH (ref 65–99)
Potassium: 3.4 mmol/L — ABNORMAL LOW (ref 3.5–5.2)
Sodium: 126 mmol/L — ABNORMAL LOW (ref 134–144)
eGFR: 42 mL/min/{1.73_m2} — ABNORMAL LOW (ref >59)

## 2021-08-14 ENCOUNTER — Telehealth: Payer: Self-pay

## 2021-08-14 DIAGNOSIS — Z86718 Personal history of other venous thrombosis and embolism: Secondary | ICD-10-CM

## 2021-08-14 DIAGNOSIS — I10 Essential (primary) hypertension: Secondary | ICD-10-CM

## 2021-08-14 MED ORDER — TORSEMIDE 20 MG PO TABS: 20.0000 mg | ORAL_TABLET | Freq: Two times a day (BID) | ORAL | 3 refills | Status: DC

## 2021-08-14 NOTE — Telephone Encounter (Signed)
The patient has been notified of the result and verbalized understanding.  All questions (if any) were answered. Alejandra Iba, RN 08/14/2021 2:12 PM  Patient will not take any more torsemide today. Starting tomorrow she will take torsemide 20 mg BID. She is not able to come in for lab work until next Tuesday.

## 2021-08-14 NOTE — Telephone Encounter (Signed)
-----   Message from Sueanne Margarita, MD sent at 08/14/2021  2:03 PM EDT ----- NA and CL low likely related to Torsemide.  Please have her hold any further torsemide today and then decrease to 20mg  BID starting tomorrow and repeat BMET on Friday

## 2021-08-15 ENCOUNTER — Other Ambulatory Visit: Payer: Medicare Other

## 2021-08-20 ENCOUNTER — Other Ambulatory Visit: Payer: Medicare Other

## 2021-08-20 ENCOUNTER — Other Ambulatory Visit: Payer: Self-pay

## 2021-08-20 DIAGNOSIS — I10 Essential (primary) hypertension: Secondary | ICD-10-CM

## 2021-08-20 DIAGNOSIS — Z86718 Personal history of other venous thrombosis and embolism: Secondary | ICD-10-CM

## 2021-08-20 LAB — BASIC METABOLIC PANEL
BUN/Creatinine Ratio: 10 — ABNORMAL LOW (ref 12–28)
BUN: 14 mg/dL (ref 8–27)
CO2: 21 mmol/L (ref 20–29)
Calcium: 8.5 mg/dL — ABNORMAL LOW (ref 8.7–10.3)
Chloride: 96 mmol/L (ref 96–106)
Creatinine, Ser: 1.39 mg/dL — ABNORMAL HIGH (ref 0.57–1.00)
Glucose: 282 mg/dL — ABNORMAL HIGH (ref 70–99)
Potassium: 4.9 mmol/L (ref 3.5–5.2)
Sodium: 128 mmol/L — ABNORMAL LOW (ref 134–144)
eGFR: 42 mL/min/{1.73_m2} — ABNORMAL LOW (ref >59)

## 2021-08-28 ENCOUNTER — Other Ambulatory Visit: Payer: Self-pay

## 2021-08-28 ENCOUNTER — Ambulatory Visit (INDEPENDENT_AMBULATORY_CARE_PROVIDER_SITE_OTHER): Payer: Medicare Other | Admitting: Physician Assistant

## 2021-08-28 ENCOUNTER — Encounter: Payer: Self-pay | Admitting: Physician Assistant

## 2021-08-28 VITALS — BP 130/52 | HR 69 | Ht 63.0 in | Wt 157.0 lb

## 2021-08-28 DIAGNOSIS — I6523 Occlusion and stenosis of bilateral carotid arteries: Secondary | ICD-10-CM

## 2021-08-28 DIAGNOSIS — I5032 Chronic diastolic (congestive) heart failure: Secondary | ICD-10-CM

## 2021-08-28 DIAGNOSIS — E7800 Pure hypercholesterolemia, unspecified: Secondary | ICD-10-CM

## 2021-08-28 DIAGNOSIS — N1831 Chronic kidney disease, stage 3a: Secondary | ICD-10-CM

## 2021-08-28 DIAGNOSIS — I2510 Atherosclerotic heart disease of native coronary artery without angina pectoris: Secondary | ICD-10-CM | POA: Diagnosis not present

## 2021-08-28 DIAGNOSIS — I739 Peripheral vascular disease, unspecified: Secondary | ICD-10-CM

## 2021-08-28 DIAGNOSIS — I10 Essential (primary) hypertension: Secondary | ICD-10-CM

## 2021-08-28 DIAGNOSIS — Z86718 Personal history of other venous thrombosis and embolism: Secondary | ICD-10-CM

## 2021-08-28 NOTE — Patient Instructions (Signed)
Medication Instructions:   TAKE EXTRA TORSEMIDE two tablets by mouth ( 40 mg) X 3 days then resume one tablet by mouth ( 20 mg) daily.   *If you need a refill on your cardiac medications before your next appointment, please call your pharmacy*  Lab Work: TODAY!!!!!BMET If you have labs (blood work) drawn today and your tests are completely normal, you will receive your results only by: [place] (if you have MyChart) OR A paper copy in the mail If you have any lab test that is abnormal or we need to change your treatment, we will call you to review the results.  Testing/Procedures: Your physician has requested that you have a carotid duplex. This test is an ultrasound of the carotid arteries in your neck. It looks at blood flow through these arteries that supply the brain with blood. Allow one hour for this exam. There are no restrictions or special instructions.  Follow-Up: At [facility], you and your health needs are our priority.  As part of our continuing mission to provide you with exceptional heart care, we have created designated Provider Care Teams.  These Care Teams include your primary Cardiologist (physician) and Advanced Practice Providers (APPs -  Physician Assistants and Nurse Practitioners) who all work together to provide you with the care you need, when you need it.  We recommend signing up for the patient portal called "MyChart".  Sign up information is provided on this After Visit Summary.  MyChart is used to connect with patients for Virtual Visits (Telemedicine).  Patients are able to view lab/test results, encounter notes, upcoming appointments, etc.  Non-urgent messages can be sent to your provider as well.   To learn more about what you can do with MyChart, go to [email or web address].    Your next appointment:   8 week(s)  The format for your next appointment:   In Person  Provider:   Fransico Him, MD   Other Instructions  Call office @  [phone] or send mychart message if you feel like you still need to take extra dose of torsemide.

## 2021-08-28 NOTE — Progress Notes (Signed)
Cardiology Office Note:    Date:  08/28/2021   ID:  Alejandra Hernandez, DOB 1956/04/24, MRN [id number]  PCP:  Antonietta Jewel, MD   [facility] Providers Cardiologist:  Fransico Him, MD PV Cardiologist:  Kathlyn Sacramento, MD     Referring MD: Antonietta Jewel, MD   Chief Complaint:  Hospitalization Follow-up (S/p cath)    Patient Profile:   Alejandra Hernandez is a 65 y.o. female with:  Coronary artery disease S/p DES to the RCA in 2018 Cath 2019: Patent RCA stent, stenosis in small AV groove circumflex treated medically Myoview 5/22: Low risk Cath 08/02/2021: Nonobstructive disease, patent RCA stent Peripheral arterial disease S/p left SFA stent in 2017 S/p right SFA stent in 2018 S/p right CIA stent in 2019 Right subclavian stenosis Carotid artery disease Carotid US 08/2019: Bilateral ICA 40-59 Followed by [facility] Mild to moderate renal artery stenosis Diabetes mellitus Chronic kidney disease Hypertension Hyperlipidemia Diabetic peripheral neuropathy History of DVT on chronic anticoagulation History of upper GI bleed in 2017 GERD Gout  Prior CV studies: LEFT HEART CATH AND CORONARY ANGIOGRAPHY 08/02/2021 Narrative   Mid Cx lesion is 30% stenosed.   Dist Cx lesion is 60% stenosed.   Non-stenotic Mid RCA lesion was previously treated.   The left ventricular ejection fraction is 55-65% by visual estimate. Nonobstructive CAD Normal LV function Normal LVEDP   GATED SPECT MYO PERF W/LEXISCAN STRESS 1D 04/18/2021 Narrative  The left ventricular ejection fraction is normal (55-65%).  Nuclear stress EF: 57%.  There was no ST segment deviation noted during stress.  The study is normal.  This is a low risk study.   CARDIAC TELEMETRY MONITORING-INTERPRETATION ONLY 09/01/2019 Narrative  Sinus bradycardia, normal sinus rhythm, sinus tachycardia with average heart rate 64bpm and ranged from 39 to 143bpm.  1 episode of Wide complex tachycardia at 4 beats.  Transient second  degree AV block type 1  Transient complete heart block wiht 4 second pause in the setting of nausea and vomiting    ECHO COMPLETE WO IMAGING ENHANCING AGENT 08/02/2021 EF 65-70, no RWMA, moderate asymmetric LVH, normal RVSF, trivial MR, mild AV sclerosis without stenosis  VAS US CAROTID DUPLEX BILATERAL 09/07/2019 Bilateral ICA 40-59; bilateral subclavian artery flow disturbed     History of Present Illness: Ms. Alejandra Hernandez was last seen by Dr. Radford Pax 08/01/2021.   The patient was having significant chest pain and was admitted to the hospital for unstable angina pectoris.  The patient was admitted 9/8-9/10 with unstable angina.  High-sensitivity troponins were minimally elevated without significant delta.  Blood pressure was significantly elevated.  Carvedilol was added to her medical regimen.  Cardiac catheterization demonstrated nonobstructive disease.  Medical therapy was continued.  Echocardiogram demonstrated normal LV function.  She returns for follow-up.  She is here alone.  Since discharge from the hospital, she notes she feels better.  She has not had further chest pain.  She has noted somewhat of a cough and some congestion.  She also feels a little bit more short of breath.  Her weight is up and she feels more swollen.  She has slept on an incline for years without significant change.    Past Medical History:  Diagnosis Date   Anemia    Anxiety    Arthritis    "right knee, back, feet, hands" (10/20/2018)   Bleeding stomach ulcer 01/2016   CAD (coronary artery disease)    05/19/17 PCI with DESx1 to [facility]   Carotid artery disease ([place])  40-59% right and 1-39% left by doppler 05/2018 with stenotic right subclavian artery   Chronic lower back pain    CKD (chronic kidney disease) stage 3, GFR 30-59 ml/min (HCC) 01/2016   Depression    Diabetic peripheral neuropathy ([place])    DVT (deep venous thrombosis) ([place]) 12/2016   BLE   GERD (gastroesophageal reflux disease)    GI bleed    Gout     Headache    "weekly" (11/27//2019)   History of blood transfusion 2017   "related to stomach bleeding"   Hyperlipidemia    Hypertension    NSTEMI (non-ST elevated myocardial infarction) ([place]) 05/2017   PAD (peripheral artery disease) ([place])    a. LE stenting in 2017, 12/2017, 09/2018 - Dr. Fletcher Anon   Pneumonia    "several times" (10/20/2018)   Renal artery stenosis ([place])    a.  mild to moderate RAS by aortogram in 2017 (no evidence of this on duplex 06/2018).   Stenosis of right subclavian artery (HCC)    Type II diabetes mellitus (HCC)    Current Medications: Current Meds  Medication Sig   albuterol (PROAIR HFA) 108 (90 Base) MCG/ACT inhaler Inhale 2 puffs into the lungs every 4 (four) hours as needed for wheezing or shortness of breath.   amitriptyline (ELAVIL) 25 MG tablet Take 75 mg by mouth at bedtime.    amLODipine (NORVASC) 10 MG tablet Take 10 mg by mouth at bedtime.   amoxicillin (AMOXIL) 500 MG capsule Take 2,000 mg by mouth See admin instructions. 2000 mg prior to dental appointment   aspirin EC 81 MG tablet Take 1 tablet (81 mg total) by mouth daily. Swallow whole.   atropine 1 % ophthalmic solution Place 1 drop into both eyes 3 (three) times daily as needed (dry eyes).   Blood Glucose Monitoring Suppl (ACCU-CHEK GUIDE ME) w/Device KIT 4 (four) times daily. for testing   Calcium Carbonate Antacid (TUMS PO) Take 2 tablets by mouth 3 (three) times daily as needed (acid indigestion).    carvedilol (COREG) 6.25 MG tablet Take 1 tablet (6.25 mg total) by mouth 2 (two) times daily with a meal.   cloNIDine (CATAPRES) 0.1 MG tablet Take 0.1 mg by mouth 2 (two) times daily.   Continuous Blood Gluc Receiver (FREESTYLE LIBRE 14 DAY READER) DEVI 1 each by Other route as needed (blood glucose).   diclofenac Sodium (VOLTAREN) 1 % GEL Apply 1 application topically daily as needed (pain). Apply to feet for pain as needed   Evolocumab (REPATHA SURECLICK) 366 MG/ML SOAJ Inject 1 pen into the skin  every 14 (fourteen) days.   ezetimibe (ZETIA) 10 MG tablet Take 1 tablet (10 mg total) by mouth daily.   ferrous sulfate 325 (65 FE) MG tablet Take 325 mg by mouth daily with breakfast.    gabapentin (NEURONTIN) 600 MG tablet Take 0.5 tablets (300 mg total) by mouth 3 (three) times daily.   glucose blood (ACCU-CHEK AVIVA PLUS) test strip 1 each by Other route 2 (two) times daily. And lancets 2/day   hydroxypropyl methylcellulose / hypromellose (ISOPTO TEARS / GONIOVISC) 2.5 % ophthalmic solution Place 1 drop into both eyes 3 (three) times daily as needed for dry eyes.   insulin degludec (TRESIBA FLEXTOUCH) 200 UNIT/ML FlexTouch Pen Inject 12 Units into the skin daily. And pen needles 1/day   insulin lispro (HUMALOG) 100 UNIT/ML injection Inject 1-10 Units into the skin 3 (three) times daily before meals. Per sliding scale   isosorbide mononitrate (IMDUR) 120  MG 24 hr tablet Take 1 tablet (120 mg total) by mouth daily.   lidocaine (LIDODERM) 5 % PLACE 1 PATCH ONTO SKIN DAILY, REMOVE AND DISCARD WITHIN 12 HOURS OR AS DIRECTED   lidocaine-prilocaine (EMLA) cream Apply 1 application topically 4 (four) times daily as needed (back pain).   nitroGLYCERIN (NITROSTAT) 0.4 MG SL tablet PLACE 1 TABLET UNDER THE TONGUE EVERY 5 MINUTES AS NEEDED FOR CHEST PAIN   NURTEC 75 MG TBDP Take 75 mg by mouth daily as needed (migraine).   oxyCODONE-acetaminophen (PERCOCET) 10-325 MG tablet Take 1 tablet by mouth 4 (four) times daily as needed for pain.    pantoprazole (PROTONIX) 40 MG tablet TAKE 1 TABLET BY MOUTH TWICE DAILY BEFORE A MEAL   polyethylene glycol powder (MIRALAX) 17 GM/SCOOP powder Take 17 g by mouth daily as needed for mild constipation.   potassium chloride SA (KLOR-CON) 20 MEQ tablet Take 0.5 tablets (10 mEq total) by mouth daily. Start on 7/2   PRESCRIPTION MEDICATION Apply 1 application topically 4 (four) times daily as needed (pain). Cream compounded at [place] - B5, D3 G10, K10 Pent+ - apply  1 gram (1 gram = 1 pump) to affected area 4 times daily as needed for pain   rivaroxaban (XARELTO) 20 MG TABS tablet Take 1 tablet (20 mg total) by mouth daily with supper.   rosuvastatin (CRESTOR) 40 MG tablet Take 1 tablet (40 mg total) by mouth at bedtime.   SPIRIVA RESPIMAT 2.5 MCG/ACT AERS INHALE 2 PUFFS INTO THE LUNGS DAILY   torsemide (DEMADEX) 20 MG tablet Take 1 tablet (20 mg total) by mouth 2 (two) times daily.   Vitamin D, Ergocalciferol, (DRISDOL) 50000 units CAPS capsule Take 50,000 Units by mouth every Wednesday.     Allergies:   Lisinopril, Losartan, and Sodium bicarbonate   Social History   Tobacco Use   Smoking status: Every Day    Packs/day: 0.50    Years: 43.00    Pack years: 21.50    Types: Cigarettes, E-cigarettes    Last attempt to quit: 11/24/2017    Years since quitting: 3.7   Smokeless tobacco: Never   Tobacco comments:    started smoking 02/2019 smoking 2cigs per day as of 08/03/20  Vaping Use   Vaping Use: Never used  Substance Use Topics   Alcohol use: No    Alcohol/week: 0.0 standard drinks    Comment: 10/20/2018 "nothing since 2012"   Drug use: Never    Family Hx: The patient's family history includes Diabetes in her mother and sister; Heart disease in her mother; Kidney failure in her mother; Liver cancer in her sister; Thyroid cancer in her mother. There is no history of Colon cancer or Stomach cancer.  Review of Systems  Constitutional: Negative for fever.  Gastrointestinal:  Negative for hematochezia.  Genitourinary:  Negative for hematuria.    EKGs/Labs/Other Test Reviewed:    EKG:  EKG is not ordered today.  The ekg ordered today demonstrates n/a  Recent Labs: 04/08/2021: NT-Pro BNP 514 05/19/2021: TSH 0.508 08/01/2021: ALT 31; B Natriuretic Peptide 86.8 08/03/2021: Hemoglobin 9.8; Magnesium 1.6; Platelets 363 08/20/2021: BUN 14; Creatinine, Ser 1.39; Potassium 4.9; Sodium 128   Recent Lipid Panel Lab Results  Component Value Date/Time    CHOL 192 08/01/2021 07:35 AM   TRIG 81 08/01/2021 07:35 AM   HDL 116 08/01/2021 07:35 AM   LDLCALC 62 08/01/2021 07:35 AM     Risk Assessment/Calculations:  Physical Exam:    VS:  BP (!) 130/52   Pulse 69   Ht $R'5\' 3"'cV$  (1.6 m)   Wt 157 lb (71.2 kg)   LMP  (LMP Unknown)   SpO2 95%   BMI 27.81 kg/m     Wt Readings from Last 3 Encounters:  08/28/21 157 lb (71.2 kg)  08/03/21 150 lb 1.6 oz (68.1 kg)  05/20/21 153 lb 1.6 oz (69.4 kg)    Constitutional:      Appearance: Healthy appearance. Not in distress.  Neck:     Vascular: JVD normal.  Pulmonary:     Effort: Pulmonary effort is normal.     Breath sounds: No wheezing. Rales (coarse breath sounds bilat bases) present.  Cardiovascular:     Normal rate. Regular rhythm. Normal S1. Normal S2.      Murmurs: There is no murmur.  Edema:    Peripheral edema present.    Pretibial: bilateral 1+ edema of the pretibial area. Abdominal:     General: There is distension.     Palpations: Abdomen is soft.  Skin:    General: Skin is warm and dry.  Neurological:     Mental Status: Alert and oriented to person, place and time.     Cranial Nerves: Cranial nerves are intact.         ASSESSMENT & PLAN:   1. Chronic heart failure with preserved ejection fraction (HCC) Her diuretics have recently been reduced.  Her weight is up and she has lower extremity swelling.  She feels her breathing is somewhat more short.  I have recommended increasing her torsemide to 40 mg daily for 3 days.  She should then resume torsemide 20 mg daily.  Her nephrologist recently reduced her torsemide to once a day.  If she feels that she needs to continue on higher dose torsemide for a while, we will need to have her follow-up with nephrology sooner.  Obtain follow-up BMET today.  2. Coronary artery disease involving native coronary artery of native heart without angina pectoris History of DES to the RCA in 2018.  Recent cardiac catheterization  demonstrated patent RCA stent and mild to moderate nonobstructive disease in the LCx.  Medical therapy has been continued.  She is doing well without angina.  Continue aspirin 81 mg daily, carvedilol 6.25 mg twice daily, ezetimibe 10 mg daily, isosorbide mononitrate 120 mg daily rosuvastatin 40 mg daily.  3. PAD (peripheral artery disease) (HCC) History of stenting to the left SFA, right SFA and right CIA.  Continue follow-up with Dr. Fletcher Anon as planned.  4. Essential hypertension Blood pressure is controlled.  Continue amlodipine 10 mg daily, carvedilol 6.25 mg twice daily, clonidine 0.1 mg twice daily, isosorbide 120 mg daily.  5. Pure hypercholesterolemia LDL in 07/2021 was 62.  Continue evolocumab 140 mg every 2 weeks, ezetimibe 10 mg daily, rosuvastatin 40 mg daily.  6. History of DVT (deep vein thrombosis) She remains on chronic anticoagulation with rivaroxaban.  Recent creatinine clearance was less than 50.  Her PCP was notified that she needs to have her rivaroxaban dose reduced to 15 mg daily.  Obtain follow-up BMET today.  If her creatinine clearance remains less than 50, we will reach out again to her PCP about dosing her rivaroxaban.  7. Stage 3a chronic kidney disease ([place]) She is managed by nephrology.  Obtain follow-up BMET today as noted.  8. Bilateral carotid artery stenosis Arrange follow-up carotid Dopplers.        Dispo:  Return  in about 8 weeks (around 10/23/2021) for Routine Follow Up w/ Dr. Radford Pax.   Medication Adjustments/Labs and Tests Ordered: Current medicines are reviewed at length with the patient today.  Concerns regarding medicines are outlined above.  Tests Ordered: Orders Placed This Encounter  Procedures   Basic Metabolic Panel (BMET)   VAS US CAROTID   Medication Changes: No orders of the defined types were placed in this encounter.  Signed, Richardson Dopp, PA-C  08/28/2021 5:36 PM    [facility] [place], [place],  [place]  [address] Phone: [phone]; Fax: [phone]

## 2021-08-29 LAB — BASIC METABOLIC PANEL
BUN/Creatinine Ratio: 9 — ABNORMAL LOW (ref 12–28)
BUN: 12 mg/dL (ref 8–27)
CO2: 20 mmol/L (ref 20–29)
Calcium: 8.9 mg/dL (ref 8.7–10.3)
Chloride: 101 mmol/L (ref 96–106)
Creatinine, Ser: 1.41 mg/dL — ABNORMAL HIGH (ref 0.57–1.00)
Glucose: 160 mg/dL — ABNORMAL HIGH (ref 70–99)
Potassium: 4.6 mmol/L (ref 3.5–5.2)
Sodium: 135 mmol/L (ref 134–144)
eGFR: 42 mL/min/{1.73_m2} — ABNORMAL LOW (ref >59)

## 2021-09-17 ENCOUNTER — Ambulatory Visit (HOSPITAL_COMMUNITY)
Admission: RE | Admit: 2021-09-17 | Discharge: 2021-09-17 | Disposition: A | Discharge status: Home / Self Care | Payer: Medicare Other | Source: Ambulatory Visit | Attending: Cardiovascular Disease | Admitting: Cardiovascular Disease

## 2021-09-17 ENCOUNTER — Other Ambulatory Visit: Payer: Self-pay

## 2021-09-17 DIAGNOSIS — Z86718 Personal history of other venous thrombosis and embolism: Secondary | ICD-10-CM | POA: Insufficient documentation

## 2021-09-17 DIAGNOSIS — I2510 Atherosclerotic heart disease of native coronary artery without angina pectoris: Secondary | ICD-10-CM | POA: Diagnosis not present

## 2021-09-17 DIAGNOSIS — E7800 Pure hypercholesterolemia, unspecified: Secondary | ICD-10-CM | POA: Diagnosis present

## 2021-09-17 DIAGNOSIS — I10 Essential (primary) hypertension: Secondary | ICD-10-CM | POA: Insufficient documentation

## 2021-09-17 DIAGNOSIS — I6523 Occlusion and stenosis of bilateral carotid arteries: Secondary | ICD-10-CM | POA: Diagnosis not present

## 2021-09-17 DIAGNOSIS — N1831 Chronic kidney disease, stage 3a: Secondary | ICD-10-CM | POA: Insufficient documentation

## 2021-09-17 DIAGNOSIS — I739 Peripheral vascular disease, unspecified: Secondary | ICD-10-CM | POA: Insufficient documentation

## 2021-09-18 ENCOUNTER — Other Ambulatory Visit: Payer: Self-pay | Admitting: *Deleted

## 2021-09-18 ENCOUNTER — Encounter: Payer: Self-pay | Admitting: Physician Assistant

## 2021-09-18 DIAGNOSIS — I6523 Occlusion and stenosis of bilateral carotid arteries: Secondary | ICD-10-CM

## 2021-10-21 ENCOUNTER — Ambulatory Visit: Payer: Medicare Other | Admitting: Cardiology

## 2021-10-23 ENCOUNTER — Other Ambulatory Visit (HOSPITAL_COMMUNITY): Payer: Self-pay | Admitting: Physician Assistant

## 2021-10-23 DIAGNOSIS — I6523 Occlusion and stenosis of bilateral carotid arteries: Secondary | ICD-10-CM

## 2021-10-24 ENCOUNTER — Ambulatory Visit: Payer: Medicare Other | Admitting: Sports Medicine

## 2021-12-17 ENCOUNTER — Encounter: Payer: Self-pay | Admitting: Podiatry

## 2021-12-17 ENCOUNTER — Ambulatory Visit (INDEPENDENT_AMBULATORY_CARE_PROVIDER_SITE_OTHER): Payer: Medicare Other | Admitting: Podiatry

## 2021-12-17 ENCOUNTER — Other Ambulatory Visit: Payer: Self-pay

## 2021-12-17 DIAGNOSIS — B351 Tinea unguium: Secondary | ICD-10-CM

## 2021-12-17 DIAGNOSIS — I739 Peripheral vascular disease, unspecified: Secondary | ICD-10-CM

## 2021-12-17 DIAGNOSIS — M79674 Pain in right toe(s): Secondary | ICD-10-CM

## 2021-12-17 DIAGNOSIS — M79675 Pain in left toe(s): Secondary | ICD-10-CM

## 2021-12-17 DIAGNOSIS — L84 Corns and callosities: Secondary | ICD-10-CM | POA: Diagnosis not present

## 2021-12-17 NOTE — Progress Notes (Signed)
Subjective: Alejandra Hernandez is a 66 y.o. female patient with history of diabetes who presents to office today complaining of long,mildly painful nails  while ambulating in shoes; unable to trim. Patient states that the glucose readings have been elevated last recording 215 and last A1c was 12.  Patient reports that her last visit to PCP was on  08/28/21.  No other pedal complaints noted.  Patient Active Problem List   Diagnosis Date Noted   Chronic anticoagulation 08/03/2021   Nausea vomiting and diarrhea 05/19/2021   Dehydration 05/19/2021   Hyponatremia 05/18/2021   Hypomagnesemia 12/27/2020   Abnormal findings on diagnostic imaging of other specified body structures 08/27/2020   Disorder of mineral metabolism, unspecified 08/27/2020   Personal history of other venous thrombosis and embolism 08/27/2020   Pulmonary nodule 08/03/2020   Acidosis 07/06/2020   Complaints of total body pain 07/10/2019   Pain, unspecified 07/10/2019   Left upper extremity swelling 06/03/2019   COPD, 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/26/2019   Leukocytosis 04/14/2019   Avascular necrosis of humeral head, 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/14/2019   Hypokalemia 04/14/2019   Neuropathy 04/14/2019   DKA (diabetic ketoacidoses) 04/13/2019   Polyp of transverse colon    Atypical chest pain 11/16/2018   DM (diabetes mellitus), type 2, uncontrolled 05/23/2018   CKD (chronic kidney disease), stage III ([place]) 05/14/2018   Unstable angina ([place]) 05/14/2018   Hyperglycemia 12/23/2017   Low TSH level 06/22/2017   CAD (coronary artery disease)    Nodule on liver 05/22/2017   Liver disease, unspecified 05/22/2017   Heart palpitations 05/14/2017   Unspecified skin changes 04/18/2017   Tobacco use 03/17/2017   Benign neoplasm of descending colon    Benign neoplasm of sigmoid colon    Chronic diastolic CHF (congestive heart failure) ([place]) 02/09/2017   History of DVT (deep vein thrombosis)    Abnormal CT scan, chest    Acute urinary retention    Claudication  ([place]) 07/23/2016   PAD (peripheral artery disease) ([place])    Essential hypertension 05/14/2016   Uncontrolled type 2 diabetes mellitus with ketoacidosis without coma, with long-term current use of insulin (HCC)    GERD (gastroesophageal reflux disease) 05/17/2014   Carotid artery disease ([place]) 05/17/2014   Iron deficiency anemia 04/26/2014   Dyspnea 04/25/2014   Carotid artery bruit 04/25/2014   Hyperlipidemia    Gout    Current Outpatient Medications on File Prior to Visit  Medication Sig Dispense Refill   albuterol (PROAIR HFA) 108 (90 Base) MCG/ACT inhaler Inhale 2 puffs into the lungs every 4 (four) hours as needed for wheezing or shortness of breath. 1 Inhaler 5   amitriptyline (ELAVIL) 25 MG tablet Take 75 mg by mouth at bedtime.      amLODipine (NORVASC) 10 MG tablet Take 10 mg by mouth at bedtime.     amoxicillin (AMOXIL) 500 MG capsule Take 2,000 mg by mouth See admin instructions. 2000 mg prior to dental appointment     aspirin EC 81 MG tablet Take 1 tablet (81 mg total) by mouth daily. Swallow whole. 150 tablet 2   atropine 1 % ophthalmic solution Place 1 drop into both eyes 3 (three) times daily as needed (dry eyes).     Blood Glucose Monitoring Suppl (ACCU-CHEK GUIDE ME) w/Device KIT 4 (four) times daily. for testing     Calcium Carbonate Antacid (TUMS PO) Take 2 tablets by mouth 3 (three) times daily as needed (acid indigestion).      carvedilol (COREG) 6.25 MG tablet Take  1 tablet (6.25 mg total) by mouth 2 (two) times daily with a meal. 180 tablet 3   cloNIDine (CATAPRES) 0.1 MG tablet Take 0.1 mg by mouth 2 (two) times daily.     Continuous Blood Gluc Receiver (FREESTYLE LIBRE 14 DAY READER) DEVI 1 each by Other route as needed (blood glucose).     diclofenac Sodium (VOLTAREN) 1 % GEL Apply 1 application topically daily as needed (pain). Apply to feet for pain as needed 150 g 2   Evolocumab (REPATHA SURECLICK) 937 MG/ML SOAJ Inject 1 pen into the skin every 14 (fourteen)  days. 6 mL 3   ezetimibe (ZETIA) 10 MG tablet Take 1 tablet (10 mg total) by mouth daily. 90 tablet 3   ferrous sulfate 325 (65 FE) MG tablet Take 325 mg by mouth daily with breakfast.      gabapentin (NEURONTIN) 600 MG tablet Take 0.5 tablets (300 mg total) by mouth 3 (three) times daily. 90 tablet 2   glucose blood (ACCU-CHEK AVIVA PLUS) test strip 1 each by Other route 2 (two) times daily. And lancets 2/day 200 each 3   hydroxypropyl methylcellulose / hypromellose (ISOPTO TEARS / GONIOVISC) 2.5 % ophthalmic solution Place 1 drop into both eyes 3 (three) times daily as needed for dry eyes.     insulin degludec (TRESIBA FLEXTOUCH) 200 UNIT/ML FlexTouch Pen Inject 12 Units into the skin daily. And pen needles 1/day     insulin lispro (HUMALOG) 100 UNIT/ML injection Inject 1-10 Units into the skin 3 (three) times daily before meals. Per sliding scale     isosorbide mononitrate (IMDUR) 120 MG 24 hr tablet Take 1 tablet (120 mg total) by mouth daily. 90 tablet 3   lidocaine (LIDODERM) 5 % PLACE 1 PATCH ONTO SKIN DAILY, REMOVE AND DISCARD WITHIN 12 HOURS OR AS DIRECTED 90 patch 1   lidocaine-prilocaine (EMLA) cream Apply 1 application topically 4 (four) times daily as needed (back pain).     nitroGLYCERIN (NITROSTAT) 0.4 MG SL tablet PLACE 1 TABLET UNDER THE TONGUE EVERY 5 MINUTES AS NEEDED FOR CHEST PAIN 25 tablet 7   NURTEC 75 MG TBDP Take 75 mg by mouth daily as needed (migraine).     oxyCODONE-acetaminophen (PERCOCET) 10-325 MG tablet Take 1 tablet by mouth 4 (four) times daily as needed for pain.   0   pantoprazole (PROTONIX) 40 MG tablet TAKE 1 TABLET BY MOUTH TWICE DAILY BEFORE A MEAL 180 tablet 0   polyethylene glycol powder (MIRALAX) 17 GM/SCOOP powder Take 17 g by mouth daily as needed for mild constipation.     potassium chloride SA (KLOR-CON) 20 MEQ tablet Take 0.5 tablets (10 mEq total) by mouth daily. Start on 7/2 90 tablet 1   PRESCRIPTION MEDICATION Apply 1 application topically 4 (four)  times daily as needed (pain). Cream compounded at [place] - B5, D3 G10, K10 Pent+ - apply 1 gram (1 gram = 1 pump) to affected area 4 times daily as needed for pain     rivaroxaban (XARELTO) 20 MG TABS tablet Take 1 tablet (20 mg total) by mouth daily with supper. 30 tablet 11   rosuvastatin (CRESTOR) 40 MG tablet Take 1 tablet (40 mg total) by mouth at bedtime.     SPIRIVA RESPIMAT 2.5 MCG/ACT AERS INHALE 2 PUFFS INTO THE LUNGS DAILY 4 g 5   torsemide (DEMADEX) 20 MG tablet Take 1 tablet (20 mg total) by mouth 2 (two) times daily. 180 tablet 3   Vitamin D, Ergocalciferol, (DRISDOL)  50000 units CAPS capsule Take 50,000 Units by mouth every Wednesday.   10   No current facility-administered medications on file prior to visit.   Allergies  Allergen Reactions   Lisinopril Hives   Losartan Other (See Comments)    Pt reports causes GI issues   Sodium Bicarbonate Other (See Comments)    Pt reports causes GI issues      Objective: General: Patient is awake, alert, and oriented x 3 and in no acute distress.  Integument: Skin is warm, dry and supple bilateral. Nails are tender, long, thickened and dystrophic with subungual debris, consistent with onychomycosis, 1-5 bilateral. No signs of infection. Mild callus to bilateral hallux, remaining integument unremarkable.  Vasculature:  Dorsalis Pedis pulse 1/4 bilateral. Posterior Tibial pulse  1/4 bilateral. Capillary fill time <3 sec 1-5 bilateral. Positive hair growth to the level of the digits.Temperature gradient within normal limits.  1+ pitting edema bilateral ankles however easily displaced to be able to lightly palpate the pulses.  Pitting edema is worse on the left as compared to the right.  Neurology: The patient has diminished sensation measured with a 5.07/10g Semmes Weinstein Monofilament at all pedal sites bilateral. Vibratory sensation diminished bilateral with tuning fork. No Babinski sign present bilateral.   Musculoskeletal:  Asymptomatic hallux limitus, pes planus, hammertoe pedal deformities noted bilateral. Muscular strength 4/5 in all lower extremity muscular groups bilateral without pain on range of motion.  Pain with walking calf that goes away with rest concerning for claudication.  No tenderness with calf compression bilateral.  No acute signs of DVT.  Assessment and Plan: Problem List Items Addressed This Visit   None   -Examined patient. -Re-Discussed and educated patient on diabetic foot care  -Mechanically debrided all nails 1-5 bilateral using sterile nail nipper and filed with dremel without incident  -Advised patient to follow-up with her vascular doctor sooner states pain and swelling has progressed -Patient to return  in 3 months for at risk foot nail care and callus care -Patient advised to call the office if any problems or questions arise in the meantime.  Lorenda Peck, MD

## 2021-12-21 ENCOUNTER — Other Ambulatory Visit: Payer: Self-pay | Admitting: Cardiovascular Disease

## 2021-12-23 NOTE — Telephone Encounter (Signed)
Refill request

## 2021-12-24 ENCOUNTER — Other Ambulatory Visit: Payer: Self-pay

## 2021-12-24 ENCOUNTER — Encounter: Payer: Self-pay | Admitting: Cardiovascular Disease

## 2021-12-24 ENCOUNTER — Ambulatory Visit (INDEPENDENT_AMBULATORY_CARE_PROVIDER_SITE_OTHER): Payer: Medicare Other | Admitting: Cardiovascular Disease

## 2021-12-24 VITALS — BP 148/71 | HR 75 | Ht 63.0 in | Wt 149.2 lb

## 2021-12-24 DIAGNOSIS — I2510 Atherosclerotic heart disease of native coronary artery without angina pectoris: Secondary | ICD-10-CM

## 2021-12-24 DIAGNOSIS — I739 Peripheral vascular disease, unspecified: Secondary | ICD-10-CM

## 2021-12-24 DIAGNOSIS — I6523 Occlusion and stenosis of bilateral carotid arteries: Secondary | ICD-10-CM

## 2021-12-24 DIAGNOSIS — Z86718 Personal history of other venous thrombosis and embolism: Secondary | ICD-10-CM

## 2021-12-24 DIAGNOSIS — E785 Hyperlipidemia, unspecified: Secondary | ICD-10-CM

## 2021-12-24 NOTE — Patient Instructions (Addendum)
Medication Instructions:  STOP the Aspirin  *If you need a refill on your cardiac medications before your next appointment, please call your pharmacy*   Lab Work: None ordered If you have labs (blood work) drawn today and your tests are completely normal, you will receive your results only by: [place] (if you have MyChart) OR A paper copy in the mail If you have any lab test that is abnormal or we need to change your treatment, we will call you to review the results.   Testing/Procedures: Your physician has requested that you have an Aorta/Iliac Duplex in September. This will be take place at [place], [address].   No food after 11PM the night before.  Water is OK. (Don't drink liquids if you have been instructed not to for ANOTHER test) Avoid foods that produce bowel gas, for 24 hours prior to exam (see below). No breakfast, no chewing gum, no smoking or carbonated beverages. Patient may take morning medications with water. Come in for test at least 15 minutes early to register.  Your physician has requested that you have an ankle brachial index (ABI) in September. During this test an ultrasound and blood pressure cuff are used to evaluate the arteries that supply the arms and legs with blood. Allow thirty minutes for this exam. There are no restrictions or special instructions. This will take place at [place], [address].   Your physician has requested that you have a lower extremity arterial duplex in September. During this test, ultrasound is used to evaluate arterial blood flow in the legs. Allow one hour for this exam. There are no restrictions or special instructions. This will take place at [place], [address].  Follow-Up: At [facility], you and your health needs are our priority.  As part of our continuing mission to provide you with exceptional heart care, we have created designated Provider Care Teams.  These Care Teams include your  primary Cardiologist (physician) and Advanced Practice Providers (APPs -  Physician Assistants and Nurse Practitioners) who all work together to provide you with the care you need, when you need it.  We recommend signing up for the patient portal called "MyChart".  Sign up information is provided on this After Visit Summary.  MyChart is used to connect with patients for Virtual Visits (Telemedicine).  Patients are able to view lab/test results, encounter notes, upcoming appointments, etc.  Non-urgent messages can be sent to your provider as well.   To learn more about what you can do with MyChart, go to [email or web address].    Your next appointment:   Follow up with Dr. Fletcher Anon in September after the dopplers
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
## 2022-01-23 ENCOUNTER — Ambulatory Visit: Payer: Medicare Other | Admitting: Physician Assistant

## 2022-02-17 ENCOUNTER — Ambulatory Visit: Payer: Medicare Other | Admitting: Physician Assistant

## 2022-02-25 ENCOUNTER — Encounter: Payer: Self-pay | Admitting: Physician Assistant

## 2022-02-25 ENCOUNTER — Other Ambulatory Visit (INDEPENDENT_AMBULATORY_CARE_PROVIDER_SITE_OTHER): Payer: Medicare Other

## 2022-02-25 ENCOUNTER — Ambulatory Visit (INDEPENDENT_AMBULATORY_CARE_PROVIDER_SITE_OTHER): Payer: Medicare Other | Admitting: Physician Assistant

## 2022-02-25 VITALS — BP 132/60 | HR 60 | Ht 63.0 in | Wt 143.4 lb

## 2022-02-25 DIAGNOSIS — D509 Iron deficiency anemia, unspecified: Secondary | ICD-10-CM | POA: Diagnosis not present

## 2022-02-25 DIAGNOSIS — R159 Full incontinence of feces: Secondary | ICD-10-CM | POA: Diagnosis not present

## 2022-02-25 DIAGNOSIS — Z86010 Personal history of colonic polyps: Secondary | ICD-10-CM | POA: Diagnosis not present

## 2022-02-25 LAB — CBC WITH DIFFERENTIAL/PLATELET
Basophils Absolute: 0.1 10*3/uL (ref 0.0–0.1)
Basophils Relative: 0.9 % (ref 0.0–3.0)
Eosinophils Absolute: 0 10*3/uL (ref 0.0–0.7)
Eosinophils Relative: 0.4 % (ref 0.0–5.0)
HCT: 37.5 % (ref 36.0–46.0)
Hemoglobin: 11.6 g/dL — ABNORMAL LOW (ref 12.0–15.0)
Lymphocytes Relative: 33.1 % (ref 12.0–46.0)
Lymphs Abs: 2.9 10*3/uL (ref 0.7–4.0)
MCHC: 31 g/dL (ref 30.0–36.0)
MCV: 69.6 fl — ABNORMAL LOW (ref 78.0–100.0)
Monocytes Absolute: 0.8 10*3/uL (ref 0.1–1.0)
Monocytes Relative: 9 % (ref 3.0–12.0)
Neutro Abs: 5 10*3/uL (ref 1.4–7.7)
Neutrophils Relative %: 56.6 % (ref 43.0–77.0)
Platelets: 331 10*3/uL (ref 150.0–400.0)
RBC: 5.39 Mil/uL — ABNORMAL HIGH (ref 3.87–5.11)
RDW: 18.7 % — ABNORMAL HIGH (ref 11.5–15.5)
WBC: 8.8 10*3/uL (ref 4.0–10.5)

## 2022-02-25 LAB — BASIC METABOLIC PANEL
BUN: 13 mg/dL (ref 6–23)
CO2: 24 mEq/L (ref 19–32)
Calcium: 8.7 mg/dL (ref 8.4–10.5)
Chloride: 98 mEq/L (ref 96–112)
Creatinine, Ser: 1.6 mg/dL — ABNORMAL HIGH (ref 0.40–1.20)
GFR: 33.67 mL/min — ABNORMAL LOW (ref >60.00)
Glucose, Bld: 323 mg/dL — ABNORMAL HIGH (ref 70–99)
Potassium: 4.3 mEq/L (ref 3.5–5.1)
Sodium: 129 mEq/L — ABNORMAL LOW (ref 135–145)

## 2022-02-25 LAB — IBC + FERRITIN
Ferritin: 15.5 ng/mL (ref 10.0–291.0)
Iron: 56 ug/dL (ref 42–145)
Saturation Ratios: 15.8 % — ABNORMAL LOW (ref 20.0–50.0)
TIBC: 354.2 ug/dL (ref 250.0–450.0)
Transferrin: 253 mg/dL (ref 212.0–360.0)

## 2022-02-25 NOTE — Patient Instructions (Signed)
If you are age 66 or older, your body mass index should be between 23-30. Your Body mass index is 25.4 kg/m?Marland Kitchen If this is out of the aforementioned range listed, please consider follow up with your Primary Care Provider. ?________________________________________________________ ? ?The [facility] providers would like to encourage you to use [facility] to communicate with providers for non-urgent requests or questions.  Due to long hold times on the telephone, sending your provider a message by [facility] may be a faster and more efficient way to get a response.  Please allow 48 business hours for a response.  Please remember that this is for non-urgent requests.  ?_______________________________________________________ ? ?Your provider has requested that you go to the basement level for lab work before leaving today. Press "B" on the elevator. The lab is located at the first door on the left as you exit the elevator. ? ?Increase your Iron 325 mg to twice daily ? ?Continue Pantoprazole 40 mg ? ?Start Benefiber once daily as directed, to help bulk stools. ? ?You have been scheduled to follow up with Dr. Ardis Hughs on May 13, 2022 at 3:20 pm. ? ?Thank you for entrusting me with your care and choosing [facility]. ? ?Nicoletta Ba, PA-C ?

## 2022-02-25 NOTE — Progress Notes (Signed)
? ?Subjective:  ? ? Patient ID: Alejandra Hernandez, female    DOB: [birth year], 66 y.o.   MRN: [id number] ? ?HPI ?Alejandra Hernandez is a 66 year old African-American female, known to Dr. Ardis Hernandez, who is referred back today by her PCP Dr. Sheryle Hernandez for evaluation of anemia, and possible repeat colonoscopy. ?Patient was last seen here in March 2020 when she underwent EGD and colonoscopy for iron deficiency anemia.  EGD was normal, duodenal biopsies were negative. ?At colonoscopy she had 1 diminutive polyp which was a tubular adenoma, mild erythema throughout the colon which was biopsied and these were unremarkable.  She does have prior history of multiple adenomatous polyps.  Currently indicated for 7-year interval follow-up. ?Patient is maintained on chronic Xarelto with history of coronary artery disease status post prior stents, peripheral arterial disease with history of severe claudication, status post angioplasties and stents 2017, history of carotid disease, COPD without oxygen use, chronic GERD, insulin-dependent diabetes mellitus, chronic kidney disease stage III. ?Most recent echo with EF of 60 to 70% and no aortic stenosis ?Cardiac cath September 2022 nonobstructive coronary artery disease. ? ?Labs from PCP from December 2022 hemoglobin 10.3, and labs from November 2022 hemoglobin 10.8/MCV 71.8 ? ?Reviewing her records, she has been chronically anemic over the past couple of years. ?August 2020 hemoglobin 10.4/MCV 71 ?March 2021 hemoglobin 10.8 MCV 70  ?September 2022 hemoglobin 9.8/hematocrit 32 ? ?Most recent iron studies March 2020 ferritin 15/serum iron 28/TIBC 363/iron sat 8 ? ?She has not had prior iron infusions. ?She has recently been started on ferrous sulfate 325 mg p.o. taking twice daily most days. ?She is on a chronic PPI ? ?She has been unaware of any issues with melena or hematochezia, has no complaints of abdominal discomfort, no heartburn or indigestion.  She has been having problems with fecal incontinence  over the past 4 to 5 months.  Had intermittent symptoms prior to then. ?She usually has 1 very soft bowel movement daily but frequently has no sensation of having a bowel movement until it has occurred and has been using depends if she has to leave the house. ? ?Review of Systems Pertinent positive and negative review of systems were noted in the above HPI section.  All other review of systems was otherwise negative.  ? ?Outpatient Encounter Medications as of 02/25/2022  ?Medication Sig  ? albuterol (PROAIR HFA) 108 (90 Base) MCG/ACT inhaler Inhale 2 puffs into the lungs every 4 (four) hours as needed for wheezing or shortness of breath.  ? amitriptyline (ELAVIL) 25 MG tablet Take 75 mg by mouth at bedtime.   ? amLODipine (NORVASC) 10 MG tablet Take 10 mg by mouth at bedtime.  ? amoxicillin (AMOXIL) 500 MG capsule Take 2,000 mg by mouth See admin instructions. 2000 mg prior to dental appointment  ? atropine 1 % ophthalmic solution Place 1 drop into both eyes 3 (three) times daily as needed (dry eyes).  ? Blood Glucose Monitoring Suppl (ACCU-CHEK GUIDE ME) w/Device KIT 4 (four) times daily. for testing  ? Calcium Carbonate Antacid (TUMS PO) Take 2 tablets by mouth 3 (three) times daily as needed (acid indigestion).   ? carvedilol (COREG) 6.25 MG tablet Take 1 tablet (6.25 mg total) by mouth 2 (two) times daily with a meal.  ? cloNIDine (CATAPRES) 0.1 MG tablet Take 0.1 mg by mouth 2 (two) times daily.  ? Continuous Blood Gluc Receiver (FREESTYLE LIBRE 14 DAY READER) DEVI 1 each by Other route as needed (blood glucose).  ? diclofenac Sodium (  VOLTAREN) 1 % GEL Apply 1 application topically daily as needed (pain). Apply to feet for pain as needed  ? Evolocumab (REPATHA SURECLICK) 373 MG/ML SOAJ Inject 1 pen into the skin every 14 (fourteen) days.  ? ezetimibe (ZETIA) 10 MG tablet Take 1 tablet (10 mg total) by mouth daily.  ? ferrous sulfate 325 (65 FE) MG tablet Take 325 mg by mouth in the morning and at bedtime.  ?  gabapentin (NEURONTIN) 600 MG tablet Take 0.5 tablets (300 mg total) by mouth 3 (three) times daily.  ? Glucagon 3 MG/DOSE POWD Place into the nose as needed.  ? glucose blood (ACCU-CHEK AVIVA PLUS) test strip 1 each by Other route 2 (two) times daily. And lancets 2/day  ? hydroxypropyl methylcellulose / hypromellose (ISOPTO TEARS / GONIOVISC) 2.5 % ophthalmic solution Place 1 drop into both eyes 3 (three) times daily as needed for dry eyes.  ? insulin degludec (TRESIBA FLEXTOUCH) 200 UNIT/ML FlexTouch Pen Inject 14 Units into the skin daily in the afternoon.  ? insulin lispro (HUMALOG) 100 UNIT/ML injection Inject 1-10 Units into the skin 3 (three) times daily before meals. Per sliding scale  ? isosorbide mononitrate (IMDUR) 120 MG 24 hr tablet Take 1 tablet (120 mg total) by mouth daily.  ? lidocaine (LIDODERM) 5 % PLACE 1 PATCH ONTO SKIN DAILY, REMOVE AND DISCARD WITHIN 12 HOURS OR AS DIRECTED  ? lidocaine-prilocaine (EMLA) cream Apply 1 application topically 4 (four) times daily as needed (back pain).  ? nitroGLYCERIN (NITROSTAT) 0.4 MG SL tablet PLACE 1 TABLET UNDER THE TONGUE EVERY 5 MINUTES AS NEEDED FOR CHEST PAIN  ? NURTEC 75 MG TBDP Take 75 mg by mouth daily as needed (migraine).  ? oxyCODONE-acetaminophen (PERCOCET) 10-325 MG tablet Take 1 tablet by mouth 4 (four) times daily as needed for pain.   ? pantoprazole (PROTONIX) 40 MG tablet TAKE 1 TABLET BY MOUTH TWICE DAILY BEFORE A MEAL  ? polyethylene glycol powder (MIRALAX) 17 GM/SCOOP powder Take 17 g by mouth daily as needed for mild constipation.  ? potassium chloride SA (KLOR-CON) 20 MEQ tablet Take 0.5 tablets (10 mEq total) by mouth daily. Start on 7/2  ? PRESCRIPTION MEDICATION Apply 1 application topically 4 (four) times daily as needed (pain). Cream compounded at [place] - B5, D3 G10, K10 Pent+ - apply 1 gram (1 gram = 1 pump) to affected area 4 times daily as needed for pain  ? rosuvastatin (CRESTOR) 40 MG tablet Take 1 tablet (40 mg  total) by mouth at bedtime.  ? torsemide (DEMADEX) 20 MG tablet Take 1 tablet (20 mg total) by mouth 2 (two) times daily.  ? Vitamin D, Ergocalciferol, (DRISDOL) 50000 units CAPS capsule Take 50,000 Units by mouth every Wednesday.   ? XARELTO 15 MG TABS tablet Take 15 mg by mouth daily.  ? FARXIGA 10 MG TABS tablet Take 10 mg by mouth daily. (Patient not taking: Reported on 02/25/2022)  ? SPIRIVA RESPIMAT 2.5 MCG/ACT AERS INHALE 2 PUFFS INTO THE LUNGS DAILY (Patient not taking: Reported on 02/25/2022)  ? ?No facility-administered encounter medications on file as of 02/25/2022.  ? ?Allergies  ?Allergen Reactions  ? Lisinopril Hives  ? Losartan Other (See Comments)  ?  Pt reports causes GI issues  ? Sodium Bicarbonate Other (See Comments)  ?  Pt reports causes GI issues  ? ?Patient Active Problem List  ? Diagnosis Date Noted  ? Chronic anticoagulation 08/03/2021  ? Nausea vomiting and diarrhea 05/19/2021  ? Dehydration 05/19/2021  ?  Hyponatremia 05/18/2021  ? Hypomagnesemia 12/27/2020  ? Abnormal findings on diagnostic imaging of other specified body structures 08/27/2020  ? Disorder of mineral metabolism, unspecified 08/27/2020  ? Personal history of other venous thrombosis and embolism 08/27/2020  ? Pulmonary nodule 08/03/2020  ? Acidosis 07/06/2020  ? Complaints of total body pain 07/10/2019  ? Pain, unspecified 07/10/2019  ? Left upper extremity swelling 06/03/2019  ? COPD, mild ([place]) 05/26/2019  ? Leukocytosis 04/14/2019  ? Avascular necrosis of humeral head, left ([place]) 04/14/2019  ? Hypokalemia 04/14/2019  ? Neuropathy 04/14/2019  ? DKA (diabetic ketoacidoses) 04/13/2019  ? Polyp of transverse colon   ? Atypical chest pain 11/16/2018  ? DM (diabetes mellitus), type 2, uncontrolled 05/23/2018  ? CKD (chronic kidney disease), stage III ([place]) 05/14/2018  ? Unstable angina ([place]) 05/14/2018  ? Hyperglycemia 12/23/2017  ? Low TSH level 06/22/2017  ? CAD (coronary artery disease)   ? Nodule on liver 05/22/2017  ? Liver  disease, unspecified 05/22/2017  ? Heart palpitations 05/14/2017  ? Unspecified skin changes 04/18/2017  ? Tobacco use 03/17/2017  ? Benign neoplasm of descending colon   ? Benign neoplasm of sigmoid colon   ? Chro

## 2022-02-26 ENCOUNTER — Ambulatory Visit: Payer: Medicare Other

## 2022-02-26 DIAGNOSIS — M2141 Flat foot [pes planus] (acquired), right foot: Secondary | ICD-10-CM

## 2022-02-26 DIAGNOSIS — E1159 Type 2 diabetes mellitus with other circulatory complications: Secondary | ICD-10-CM

## 2022-02-26 NOTE — Progress Notes (Signed)
SITUATION ?Reason for Consult: Evaluation for Prefabricated Diabetic Shoes and Custom Diabetic Inserts. ?Patient / Caregiver Report: Patient would like well fitting shoes ? ?OBJECTIVE DATA: ?Patient History / Diagnosis:  ?  ICD-10-CM   ?1. Type 2 diabetes mellitus with other circulatory complication, unspecified whether long term insulin use (HCC)  E11.59   ?  ?2. PAD (peripheral artery disease) (HCC)  I73.9   ?  ? ? ?Physician Treating Diabetes:  Antonietta Jewel, MD ? ?Current or Previous Devices:   Current user ? ?In-Person Foot Examination: ?Ulcers & Callousing:   Right 5th met ?Deformities:    hammertoes ?Sensation:    Compromised  ?Shoe Size:     57M ? ?ORTHOTIC RECOMMENDATION ?Recommended Devices: ?- 1x pair prefabricated PDAC approved diabetic shoes; Patient Selected Orthofeet Coral 981 black Size 57M ?- 3x pair custom-to-patient PDAC approved vacuum formed diabetic insoles. ? ?GOALS OF SHOES AND INSOLES ?- Reduce shear and pressure ?- Reduce / Prevent callus formation ?- Reduce / Prevent ulceration ?- Protect the fragile healing compromised diabetic foot. ? ?Patient would benefit from diabetic shoes and inserts as patient has diabetes mellitus and the patient has one or more of the following conditions: ?- History of partial or complete amputation of the foot ?- History of previous foot ulceration. ?- History of pre-ulcerative callus ?- Peripheral neuropathy with evidence of callus formation ?- Foot deformity ?- Poor circulation ? ?ACTIONS PERFORMED ?Potential out of pocket cost was communicated to patient. Patient understood and consented to measurement and casting. Patient was casted for insoles via crush box and measured for shoes via brannock device. Procedure was explained and patient tolerated procedure well. All questions were answered and concerns addressed. Casts were shipped to central fabrication for HOLD until Certificate of Medical Necessity or otherwise necessary authorization from insurance is  obtained. ? ?PLAN ?Shoes are to be ordered and casts released from hold once all appropriate paperwork is complete. Patient is to be contacted and scheduled for fitting once shoes and insoles have been fabricated and received. ? ?

## 2022-02-26 NOTE — Progress Notes (Signed)
I agree with the above note, plan 

## 2022-03-10 ENCOUNTER — Telehealth: Payer: Self-pay

## 2022-03-10 NOTE — Telephone Encounter (Signed)
Shoes Ordered - OrthoFeet Womens Coral - Black - [phone] ?

## 2022-03-17 ENCOUNTER — Ambulatory Visit: Payer: Medicare Other | Admitting: Podiatry

## 2022-03-19 ENCOUNTER — Encounter: Payer: Self-pay | Admitting: Podiatry

## 2022-03-19 ENCOUNTER — Ambulatory Visit (INDEPENDENT_AMBULATORY_CARE_PROVIDER_SITE_OTHER): Payer: Medicare Other | Admitting: Podiatry

## 2022-03-19 DIAGNOSIS — M79675 Pain in left toe(s): Secondary | ICD-10-CM

## 2022-03-19 DIAGNOSIS — B351 Tinea unguium: Secondary | ICD-10-CM

## 2022-03-19 DIAGNOSIS — E1159 Type 2 diabetes mellitus with other circulatory complications: Secondary | ICD-10-CM

## 2022-03-19 DIAGNOSIS — M79674 Pain in right toe(s): Secondary | ICD-10-CM | POA: Diagnosis not present

## 2022-03-19 DIAGNOSIS — I739 Peripheral vascular disease, unspecified: Secondary | ICD-10-CM

## 2022-03-19 MED ORDER — DICLOFENAC SODIUM 1 % EX GEL: 1.0000 "application " | Freq: Every day | CUTANEOUS | 2 refills | Status: AC | PRN

## 2022-03-19 NOTE — Progress Notes (Signed)
Subjective: [name] is a 66 y.o. female patient with history of diabetes who presents to office today complaining of long,mildly painful nails  while ambulating in shoes; unable to trim. Last A1c was 9.  Patient reports that her last visit to PCP was on  08/28/21.  No other pedal complaints noted. ? ?Patient Active Problem List  ? Diagnosis Date Noted  ? Chronic anticoagulation 08/03/2021  ? Nausea vomiting and diarrhea 05/19/2021  ? Dehydration 05/19/2021  ? Hyponatremia 05/18/2021  ? Hypomagnesemia 12/27/2020  ? Abnormal findings on diagnostic imaging of other specified body structures 08/27/2020  ? Disorder of mineral metabolism, unspecified 08/27/2020  ? Personal history of other venous thrombosis and embolism 08/27/2020  ? Pulmonary nodule 08/03/2020  ? Acidosis 07/06/2020  ? Complaints of total body pain 07/10/2019  ? Pain, unspecified 07/10/2019  ? Left upper extremity swelling 06/03/2019  ? COPD, mild ([place]) 05/26/2019  ? Leukocytosis 04/14/2019  ? Avascular necrosis of humeral head, left ([place]) 04/14/2019  ? Hypokalemia 04/14/2019  ? Neuropathy 04/14/2019  ? DKA (diabetic ketoacidoses) 04/13/2019  ? Polyp of transverse colon   ? Atypical chest pain 11/16/2018  ? Diabetes mellitus, type II ([place]) 05/23/2018  ? CKD (chronic kidney disease), stage III ([place]) 05/14/2018  ? Unstable angina ([place]) 05/14/2018  ? Hyperglycemia 12/23/2017  ? Low TSH level 06/22/2017  ? CAD (coronary artery disease)   ? Nodule on liver 05/22/2017  ? Liver disease, unspecified 05/22/2017  ? Heart palpitations 05/14/2017  ? Unspecified skin changes 04/18/2017  ? Tobacco use 03/17/2017  ? Benign neoplasm of descending colon   ? Benign neoplasm of sigmoid colon   ? Chronic diastolic CHF (congestive heart failure) ([place]) 02/09/2017  ? History of DVT (deep vein thrombosis)   ? Abnormal CT scan, chest   ? Acute urinary retention   ? Claudication ([place]) 07/23/2016  ? PAD (peripheral artery disease) ([place])   ? Essential hypertension 05/14/2016   ? Uncontrolled type 2 diabetes mellitus with ketoacidosis without coma, with long-term current use of insulin ([place])   ? GERD (gastroesophageal reflux disease) 05/17/2014  ? Carotid artery disease ([place]) 05/17/2014  ? Iron deficiency anemia 04/26/2014  ? Dyspnea 04/25/2014  ? Carotid artery bruit 04/25/2014  ? Hyperlipidemia   ? Gout   ? ?Current Outpatient Medications on File Prior to Visit  ?Medication Sig Dispense Refill  ? albuterol (PROAIR HFA) 108 (90 Base) MCG/ACT inhaler Inhale 2 puffs into the lungs every 4 (four) hours as needed for wheezing or shortness of breath. 1 Inhaler 5  ? amitriptyline (ELAVIL) 25 MG tablet Take 75 mg by mouth at bedtime.     ? amLODipine (NORVASC) 10 MG tablet Take 10 mg by mouth at bedtime.    ? amoxicillin (AMOXIL) 500 MG capsule Take 2,000 mg by mouth See admin instructions. 2000 mg prior to dental appointment    ? atropine 1 % ophthalmic solution Place 1 drop into both eyes 3 (three) times daily as needed (dry eyes).    ? Blood Glucose Monitoring Suppl (ACCU-CHEK GUIDE ME) w/Device KIT 4 (four) times daily. for testing    ? Calcium Carbonate Antacid (TUMS PO) Take 2 tablets by mouth 3 (three) times daily as needed (acid indigestion).     ? carvedilol (COREG) 6.25 MG tablet Take 1 tablet (6.25 mg total) by mouth 2 (two) times daily with a meal. 180 tablet 3  ? cloNIDine (CATAPRES) 0.1 MG tablet Take 0.1 mg by mouth 2 (two) times daily.    ?  Continuous Blood Gluc Receiver (FREESTYLE LIBRE 14 DAY READER) DEVI 1 each by Other route as needed (blood glucose).    ? Evolocumab (REPATHA SURECLICK) 378 MG/ML SOAJ Inject 1 pen into the skin every 14 (fourteen) days. 6 mL 3  ? ezetimibe (ZETIA) 10 MG tablet Take 1 tablet (10 mg total) by mouth daily. 90 tablet 3  ? FARXIGA 10 MG TABS tablet Take 10 mg by mouth daily. (Patient not taking: Reported on 02/25/2022)    ? ferrous sulfate 325 (65 FE) MG tablet Take 325 mg by mouth in the morning and at bedtime.    ? gabapentin (NEURONTIN) 600 MG  tablet Take 0.5 tablets (300 mg total) by mouth 3 (three) times daily. 90 tablet 2  ? Glucagon 3 MG/DOSE POWD Place into the nose as needed.    ? glucose blood (ACCU-CHEK AVIVA PLUS) test strip 1 each by Other route 2 (two) times daily. And lancets 2/day 200 each 3  ? hydroxypropyl methylcellulose / hypromellose (ISOPTO TEARS / GONIOVISC) 2.5 % ophthalmic solution Place 1 drop into both eyes 3 (three) times daily as needed for dry eyes.    ? insulin degludec (TRESIBA FLEXTOUCH) 200 UNIT/ML FlexTouch Pen Inject 14 Units into the skin daily in the afternoon.    ? insulin lispro (HUMALOG) 100 UNIT/ML injection Inject 1-10 Units into the skin 3 (three) times daily before meals. Per sliding scale    ? isosorbide mononitrate (IMDUR) 120 MG 24 hr tablet Take 1 tablet (120 mg total) by mouth daily. 90 tablet 3  ? lidocaine (LIDODERM) 5 % PLACE 1 PATCH ONTO SKIN DAILY, REMOVE AND DISCARD WITHIN 12 HOURS OR AS DIRECTED 90 patch 1  ? lidocaine-prilocaine (EMLA) cream Apply 1 application topically 4 (four) times daily as needed (back pain).    ? nitroGLYCERIN (NITROSTAT) 0.4 MG SL tablet PLACE 1 TABLET UNDER THE TONGUE EVERY 5 MINUTES AS NEEDED FOR CHEST PAIN 25 tablet 7  ? NURTEC 75 MG TBDP Take 75 mg by mouth daily as needed (migraine).    ? oxyCODONE-acetaminophen (PERCOCET) 10-325 MG tablet Take 1 tablet by mouth 4 (four) times daily as needed for pain.   0  ? pantoprazole (PROTONIX) 40 MG tablet TAKE 1 TABLET BY MOUTH TWICE DAILY BEFORE A MEAL 180 tablet 0  ? polyethylene glycol powder (MIRALAX) 17 GM/SCOOP powder Take 17 g by mouth daily as needed for mild constipation.    ? potassium chloride SA (KLOR-CON) 20 MEQ tablet Take 0.5 tablets (10 mEq total) by mouth daily. Start on 7/2 90 tablet 1  ? PRESCRIPTION MEDICATION Apply 1 application topically 4 (four) times daily as needed (pain). Cream compounded at [place] - B5, D3 G10, K10 Pent+ - apply 1 gram (1 gram = 1 pump) to affected area 4 times daily as needed  for pain    ? rosuvastatin (CRESTOR) 40 MG tablet Take 1 tablet (40 mg total) by mouth at bedtime.    ? SPIRIVA RESPIMAT 2.5 MCG/ACT AERS INHALE 2 PUFFS INTO THE LUNGS DAILY (Patient not taking: Reported on 02/25/2022) 4 g 5  ? torsemide (DEMADEX) 20 MG tablet Take 1 tablet (20 mg total) by mouth 2 (two) times daily. 180 tablet 3  ? Vitamin D, Ergocalciferol, (DRISDOL) 50000 units CAPS capsule Take 50,000 Units by mouth every Wednesday.   10  ? XARELTO 15 MG TABS tablet Take 15 mg by mouth daily.    ? ?No current facility-administered medications on file prior to visit.  ? ?Allergies  ?  Allergen Reactions  ? Lisinopril Hives  ? Losartan Other (See Comments)  ?  Pt reports causes GI issues  ? Sodium Bicarbonate Other (See Comments)  ?  Pt reports causes GI issues  ? ? ? ? ?Objective: ?General: Patient is awake, alert, and oriented x 3 and in no acute distress. ? ?Integument: Skin is warm, dry and supple bilateral. Nails are tender, long, thickened and dystrophic with subungual debris, consistent with onychomycosis, 1-5 bilateral. No signs of infection. Mild callus to bilateral hallux, remaining integument unremarkable. ? ?Vasculature:  Dorsalis Pedis pulse 1/4 bilateral. Posterior Tibial pulse  1/4 bilateral. Capillary fill time <3 sec 1-5 bilateral. Positive hair growth to the level of the digits.Temperature gradient within normal limits.  1+ pitting edema bilateral ankles however easily displaced to be able to lightly palpate the pulses.  Pitting edema is worse on the left as compared to the right. ? ?Neurology: The patient has diminished sensation measured with a 5.07/10g Semmes Weinstein Monofilament at all pedal sites bilateral. Vibratory sensation diminished bilateral with tuning fork. No Babinski sign present bilateral.  ? ?Musculoskeletal: Asymptomatic hallux limitus, pes planus, hammertoe pedal deformities noted bilateral. Muscular strength 4/5 in all lower extremity muscular groups bilateral without pain on  range of motion.  Pain with walking calf that goes away with rest concerning for claudication.  No tenderness with calf compression bilateral.  No acute signs of DVT. ? ?Assessment and Plan: ?Problem List
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
## 2022-04-16 ENCOUNTER — Encounter: Payer: Self-pay | Admitting: Physician Assistant

## 2022-04-16 ENCOUNTER — Ambulatory Visit (INDEPENDENT_AMBULATORY_CARE_PROVIDER_SITE_OTHER): Payer: Medicare Other | Admitting: Physician Assistant

## 2022-04-16 VITALS — BP 144/72 | HR 66 | Ht 63.0 in | Wt 143.6 lb

## 2022-04-16 DIAGNOSIS — R600 Localized edema: Secondary | ICD-10-CM | POA: Diagnosis not present

## 2022-04-16 DIAGNOSIS — I739 Peripheral vascular disease, unspecified: Secondary | ICD-10-CM | POA: Diagnosis not present

## 2022-04-16 DIAGNOSIS — I10 Essential (primary) hypertension: Secondary | ICD-10-CM

## 2022-04-16 DIAGNOSIS — I6523 Occlusion and stenosis of bilateral carotid arteries: Secondary | ICD-10-CM | POA: Diagnosis not present

## 2022-04-16 DIAGNOSIS — Z86718 Personal history of other venous thrombosis and embolism: Secondary | ICD-10-CM

## 2022-04-16 DIAGNOSIS — N1831 Chronic kidney disease, stage 3a: Secondary | ICD-10-CM

## 2022-04-16 DIAGNOSIS — I2510 Atherosclerotic heart disease of native coronary artery without angina pectoris: Secondary | ICD-10-CM

## 2022-04-16 DIAGNOSIS — Z720 Tobacco use: Secondary | ICD-10-CM

## 2022-04-16 LAB — HEPATIC FUNCTION PANEL
ALT: 25 IU/L (ref 0–32)
AST: 24 IU/L (ref 0–40)
Albumin: 3.7 g/dL — ABNORMAL LOW (ref 3.8–4.8)
Alkaline Phosphatase: 95 IU/L (ref 44–121)
Bilirubin Total: <0.2 mg/dL (ref 0.0–1.2)
Bilirubin, Direct: <0.1 mg/dL (ref 0.00–0.40)
Total Protein: 5.9 g/dL — ABNORMAL LOW (ref 6.0–8.5)

## 2022-04-16 LAB — BASIC METABOLIC PANEL WITH GFR
BUN/Creatinine Ratio: 7 — ABNORMAL LOW (ref 12–28)
BUN: 13 mg/dL (ref 8–27)
CO2: 21 mmol/L (ref 20–29)
Calcium: 8.4 mg/dL — ABNORMAL LOW (ref 8.7–10.3)
Chloride: 101 mmol/L (ref 96–106)
Creatinine, Ser: 1.74 mg/dL — ABNORMAL HIGH (ref 0.57–1.00)
Glucose: 216 mg/dL — ABNORMAL HIGH (ref 70–99)
Potassium: 5.1 mmol/L (ref 3.5–5.2)
Sodium: 134 mmol/L (ref 134–144)
eGFR: 32 mL/min/1.73 — ABNORMAL LOW (ref >59)

## 2022-04-16 MED ORDER — TORSEMIDE 20 MG PO TABS: ORAL_TABLET | ORAL | 3 refills | Status: DC

## 2022-04-16 MED ORDER — ISOSORBIDE MONONITRATE ER 120 MG PO TB24: 120.0000 mg | ORAL_TABLET | Freq: Every day | ORAL | 3 refills | Status: DC

## 2022-04-16 NOTE — Patient Instructions (Addendum)
Medication Instructions:  Your physician has recommended you make the following change in your medication:  INCREASE TORSEMIDE TO 20 MG IN THE AM AND 10 MG IN THE PM  *If you need a refill on your cardiac medications before your next appointment, please call your pharmacy*   Lab Work: TODAY: BMET, LFTS If you have labs (blood work) drawn today and your tests are completely normal, you will receive your results only by: [place] (if you have MyChart) OR A paper copy in the mail If you have any lab test that is abnormal or we need to change your treatment, we will call you to review the results.   Testing/Procedures: PLEASE SCHEDULE LOWER EXTREMITY ARTERIAL DUPLEX WITH ABIs SOONER THAN September. ORDERS HAVE ALREADY BEEN PLACED    Follow-Up: At [facility], you and your health needs are our priority.  As part of our continuing mission to provide you with exceptional heart care, we have created designated Provider Care Teams.  These Care Teams include your primary Cardiologist (physician) and Advanced Practice Providers (APPs -  Physician Assistants and Nurse Practitioners) who all work together to provide you with the care you need, when you need it.  We recommend signing up for the patient portal called "MyChart".  Sign up information is provided on this After Visit Summary.  MyChart is used to connect with patients for Virtual Visits (Telemedicine).  Patients are able to view lab/test results, encounter notes, upcoming appointments, etc.  Non-urgent messages can be sent to your provider as well.   To learn more about what you can do with MyChart, go to [email or web address].    Your next appointment:   4-5 month(s)  The format for your next appointment:   In Person  Provider:   Fransico Him, MD   YOUR PROVIDER RECOMMENDS THAT YOU [place].   Important Information About [place]

## 2022-04-24 ENCOUNTER — Ambulatory Visit (INDEPENDENT_AMBULATORY_CARE_PROVIDER_SITE_OTHER): Payer: Medicare Other

## 2022-04-24 DIAGNOSIS — M2141 Flat foot [pes planus] (acquired), right foot: Secondary | ICD-10-CM

## 2022-04-24 DIAGNOSIS — M2142 Flat foot [pes planus] (acquired), left foot: Secondary | ICD-10-CM

## 2022-04-24 DIAGNOSIS — E1159 Type 2 diabetes mellitus with other circulatory complications: Secondary | ICD-10-CM

## 2022-04-24 NOTE — Progress Notes (Signed)
SITUATION Reason for Visit: Fitting of Diabetic Shoes & Insoles Patient / Caregiver Report:  Patient is satisfied with fit and function of shoes and insoles.  OBJECTIVE DATA: Patient History / Diagnosis:     ICD-10-CM   1. Type 2 diabetes mellitus with other circulatory complication, unspecified whether long term insulin use (HCC)  E11.59     2. Pes planus of both feet  M21.41    M21.42       Change in Status:   None  ACTIONS PERFORMED: In-Person Delivery, patient was fit with: - 1x pair [id number] PDAC approved prefabricated Diabetic Shoes: Ortho[employer] Carol Black 18M - 3x pair [phone] PDAC approved vacuum formed custom diabetic insoles; [employer]: [id number]  Shoes and insoles were verified for structural integrity and safety. Patient wore shoes and insoles in office. Skin was inspected and free of areas of concern after wearing shoes and inserts. Shoes and inserts fit properly. Patient / Caregiver provided with ferbal instruction and demonstration regarding donning, doffing, wear, care, proper fit, function, purpose, cleaning, and use of shoes and insoles ' and in all related precautions and risks and benefits regarding shoes and insoles. Patient / Caregiver was instructed to wear properly fitting socks with shoes at all times. Patient was also provided with verbal instruction regarding how to report any failures or malfunctions of shoes or inserts, and necessary follow up care. Patient / Caregiver was also instructed to contact physician regarding change in status that may affect function of shoes and inserts.   Patient / Caregiver verbalized undersatnding of instruction provided. Patient / Caregiver demonstrated independence with proper donning and doffing of shoes and inserts.  PLAN Patient to follow with treating physician as recommended. Plan of care was discussed with and agreed upon by patient and/or caregiver. All questions were answered and concerns addressed.

## 2022-05-07 ENCOUNTER — Ambulatory Visit: Payer: Medicare Other | Admitting: Physician Assistant

## 2022-05-07 ENCOUNTER — Encounter (HOSPITAL_COMMUNITY): Payer: Medicare Other

## 2022-05-12 ENCOUNTER — Encounter (HOSPITAL_COMMUNITY): Payer: Medicare Other

## 2022-05-13 ENCOUNTER — Encounter: Payer: Self-pay | Admitting: Gastroenterology

## 2022-05-13 ENCOUNTER — Other Ambulatory Visit (INDEPENDENT_AMBULATORY_CARE_PROVIDER_SITE_OTHER): Payer: Medicare Other

## 2022-05-13 ENCOUNTER — Ambulatory Visit (INDEPENDENT_AMBULATORY_CARE_PROVIDER_SITE_OTHER): Payer: Medicare Other | Admitting: Gastroenterology

## 2022-05-13 DIAGNOSIS — R109 Unspecified abdominal pain: Secondary | ICD-10-CM

## 2022-05-13 DIAGNOSIS — K5900 Constipation, unspecified: Secondary | ICD-10-CM

## 2022-05-13 DIAGNOSIS — D649 Anemia, unspecified: Secondary | ICD-10-CM | POA: Diagnosis not present

## 2022-05-13 LAB — CBC
HCT: 36.7 % (ref 36.0–46.0)
Hemoglobin: 11.3 g/dL — ABNORMAL LOW (ref 12.0–15.0)
MCHC: 30.9 g/dL (ref 30.0–36.0)
MCV: 70.1 fl — ABNORMAL LOW (ref 78.0–100.0)
Platelets: 313 10*3/uL (ref 150.0–400.0)
RBC: 5.23 Mil/uL — ABNORMAL HIGH (ref 3.87–5.11)
RDW: 20.5 % — ABNORMAL HIGH (ref 11.5–15.5)
WBC: 8.8 10*3/uL (ref 4.0–10.5)

## 2022-05-13 NOTE — Patient Instructions (Signed)
If you are age 66 or older, your body mass index should be between 23-30. Your Body mass index is 24.98 kg/m. If this is out of the aforementioned range listed, please consider follow up with your Primary Care Provider. ________________________________________________________  The [facility] providers would like to encourage you to use [facility] to communicate with providers for non-urgent requests or questions.  Due to long hold times on the telephone, sending your provider a message by [facility] may be a faster and more efficient way to get a response.  Please allow 48 business hours for a response.  Please remember that this is for non-urgent requests.  _______________________________________________________  Your provider has requested that you go to the basement level for lab work before leaving today. Press "B" on the elevator. The lab is located at the first door on the left as you exit the elevator.  Due to recent changes in healthcare laws, you may see the results of your imaging and laboratory studies on MyChart before your provider has had a chance to review them.  We understand that in some cases there may be results that are confusing or concerning to you. Not all laboratory results come back in the same time frame and the provider may be waiting for multiple results in order to interpret others.  Please give [place] 48 hours in order for your provider to thoroughly review all the results before contacting the office for clarification of your results.   Dr Ardis Hughs recommends that you complete a bowel purge (to clean out your bowels). Please do the following:  Purchase a bottle of Miralax over the counter as well as a box of 5 mg dulcolax tablets. Take 4 dulcolax tablets.  Wait 1 hour.  You will then drink 6-8 capfuls of Miralax mixed in an adequate amount of water/juice/gatorade (you may choose which of these liquids to drink) over the next 2-3 hours.  You should expect results within 1 to 6  hours after completing the bowel purge.  Please purchase the following medications over the counter and take as directed:  START: Miralax daily.  Thank you for entrusting me with your care and choosing [facility].  Dr Ardis Hughs
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
## 2022-05-15 ENCOUNTER — Other Ambulatory Visit: Payer: Self-pay | Admitting: Cardiology

## 2022-06-06 ENCOUNTER — Other Ambulatory Visit: Payer: Self-pay | Admitting: Internal Medicine

## 2022-06-06 DIAGNOSIS — Z1231 Encounter for screening mammogram for malignant neoplasm of breast: Secondary | ICD-10-CM

## 2022-06-20 ENCOUNTER — Ambulatory Visit
Admission: RE | Admit: 2022-06-20 | Discharge: 2022-06-20 | Disposition: A | Discharge status: Home / Self Care | Payer: Medicare Other | Source: Ambulatory Visit | Attending: Internal Medicine | Admitting: Internal Medicine

## 2022-06-20 DIAGNOSIS — Z1231 Encounter for screening mammogram for malignant neoplasm of breast: Secondary | ICD-10-CM

## 2022-06-24 ENCOUNTER — Encounter: Payer: Self-pay | Admitting: Cardiovascular Disease

## 2022-06-24 ENCOUNTER — Ambulatory Visit (INDEPENDENT_AMBULATORY_CARE_PROVIDER_SITE_OTHER): Payer: Medicare Other | Admitting: Cardiovascular Disease

## 2022-06-24 VITALS — BP 142/60 | HR 64 | Ht 63.0 in | Wt 150.0 lb

## 2022-06-24 DIAGNOSIS — I739 Peripheral vascular disease, unspecified: Secondary | ICD-10-CM | POA: Diagnosis not present

## 2022-06-24 DIAGNOSIS — I6523 Occlusion and stenosis of bilateral carotid arteries: Secondary | ICD-10-CM

## 2022-06-24 DIAGNOSIS — I2510 Atherosclerotic heart disease of native coronary artery without angina pectoris: Secondary | ICD-10-CM | POA: Diagnosis not present

## 2022-06-24 DIAGNOSIS — E785 Hyperlipidemia, unspecified: Secondary | ICD-10-CM | POA: Diagnosis not present

## 2022-06-24 DIAGNOSIS — I10 Essential (primary) hypertension: Secondary | ICD-10-CM

## 2022-06-24 NOTE — Patient Instructions (Signed)
Medication Instructions:  No changes *If you need a refill on your cardiac medications before your next appointment, please call your pharmacy*   Lab Work: None ordered If you have labs (blood work) drawn today and your tests are completely normal, you will receive your results only by: [place] (if you have MyChart) OR A paper copy in the mail If you have any lab test that is abnormal or we need to change your treatment, we will call you to review the results.   Testing/Procedures: Your physician has requested that you have an Aorta/Iliac Duplex. This will be take place at [place], [address].   No food after 11PM the night before.  Water is OK. (Don't drink liquids if you have been instructed not to for ANOTHER test) Avoid foods that produce bowel gas, for 24 hours prior to exam (see below). No breakfast, no chewing gum, no smoking or carbonated beverages. Patient may take morning medications with water. Come in for test at least 15 minutes early to register.  Your physician has requested that you have an ankle brachial index (ABI). During this test an ultrasound and blood pressure cuff are used to evaluate the arteries that supply the arms and legs with blood. Allow thirty minutes for this exam. There are no restrictions or special instructions. This will take place at [place], [address].   Your physician has requested that you have a lower extremity arterial duplex. During this test, ultrasound is used to evaluate arterial blood flow in the legs. Allow one hour for this exam. There are no restrictions or special instructions. This will take place at [place], [address].    Follow-Up: At [facility], you and your health needs are our priority.  As part of our continuing mission to provide you with exceptional heart care, we have created designated Provider Care Teams.  These Care Teams include your primary Cardiologist (physician) and Advanced  Practice Providers (APPs -  Physician Assistants and Nurse Practitioners) who all work together to provide you with the care you need, when you need it.  We recommend signing up for the patient portal called "MyChart".  Sign up information is provided on this After Visit Summary.  MyChart is used to connect with patients for Virtual Visits (Telemedicine).  Patients are able to view lab/test results, encounter notes, upcoming appointments, etc.  Non-urgent messages can be sent to your provider as well.   To learn more about what you can do with MyChart, go to [email or web address].    Your next appointment:   12 month(s)  The format for your next appointment:   In Person  Provider:   Dr. Fletcher Anon  Important Information About Sugar
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
## 2022-06-25 ENCOUNTER — Ambulatory Visit (INDEPENDENT_AMBULATORY_CARE_PROVIDER_SITE_OTHER): Payer: Medicare Other | Admitting: Podiatry

## 2022-06-25 ENCOUNTER — Encounter: Payer: Self-pay | Admitting: Podiatry

## 2022-06-25 DIAGNOSIS — M79674 Pain in right toe(s): Secondary | ICD-10-CM

## 2022-06-25 DIAGNOSIS — M79675 Pain in left toe(s): Secondary | ICD-10-CM | POA: Diagnosis not present

## 2022-06-25 DIAGNOSIS — B351 Tinea unguium: Secondary | ICD-10-CM | POA: Diagnosis not present

## 2022-06-25 DIAGNOSIS — E1159 Type 2 diabetes mellitus with other circulatory complications: Secondary | ICD-10-CM | POA: Diagnosis not present

## 2022-06-25 DIAGNOSIS — I739 Peripheral vascular disease, unspecified: Secondary | ICD-10-CM

## 2022-06-25 NOTE — Progress Notes (Signed)
Subjective: Alejandra Hernandez is a 66 y.o. female patient with history of diabetes who presents to office today complaining of long,mildly painful nails  while ambulating in shoes; unable to trim. Last A1c was 10.3.  Patient reports that her last visit to PCP was on  08/28/21.  No other pedal complaints noted.  Patient Active Problem List   Diagnosis Date Noted   Chronic anticoagulation 08/03/2021   Nausea vomiting and diarrhea 05/19/2021   Dehydration 05/19/2021   Hyponatremia 05/18/2021   Hypomagnesemia 12/27/2020   Abnormal findings on diagnostic imaging of other specified body structures 08/27/2020   Disorder of mineral metabolism, unspecified 08/27/2020   Personal history of other venous thrombosis and embolism 08/27/2020   Pulmonary nodule 08/03/2020   Acidosis 07/06/2020   Complaints of total body pain 07/10/2019   Pain, unspecified 07/10/2019   Left upper extremity swelling 06/03/2019   COPD, mild ([place]) 05/26/2019   Leukocytosis 04/14/2019   Avascular necrosis of humeral head, left ([place]) 04/14/2019   Hypokalemia 04/14/2019   Neuropathy 04/14/2019   DKA (diabetic ketoacidoses) 04/13/2019   Polyp of transverse colon    Atypical chest pain 11/16/2018   Diabetes mellitus, type II ([place]) 05/23/2018   CKD (chronic kidney disease), stage III ([place]) 05/14/2018   Unstable angina ([place]) 05/14/2018   Hyperglycemia 12/23/2017   Low TSH level 06/22/2017   CAD (coronary artery disease)    Nodule on liver 05/22/2017   Liver disease, unspecified 05/22/2017   Heart palpitations 05/14/2017   Unspecified skin changes 04/18/2017   Tobacco use 03/17/2017   Benign neoplasm of descending colon    Benign neoplasm of sigmoid colon    Chronic diastolic CHF (congestive heart failure) ([place]) 02/09/2017   History of DVT (deep vein thrombosis)    Abnormal CT scan, chest    Acute urinary retention    Claudication ([place]) 07/23/2016   PAD (peripheral artery disease) ([place])    Essential hypertension  05/14/2016   Uncontrolled type 2 diabetes mellitus with ketoacidosis without coma, with long-term current use of insulin (HCC)    GERD (gastroesophageal reflux disease) 05/17/2014   Carotid artery disease ([place]) 05/17/2014   Iron deficiency anemia 04/26/2014   Dyspnea 04/25/2014   Carotid artery bruit 04/25/2014   Hyperlipidemia    Gout    Current Outpatient Medications on File Prior to Visit  Medication Sig Dispense Refill   albuterol (PROAIR HFA) 108 (90 Base) MCG/ACT inhaler Inhale 2 puffs into the lungs every 4 (four) hours as needed for wheezing or shortness of breath. 1 Inhaler 5   amitriptyline (ELAVIL) 25 MG tablet Take 75 mg by mouth at bedtime.      amLODipine (NORVASC) 10 MG tablet Take 10 mg by mouth at bedtime.     amoxicillin (AMOXIL) 500 MG capsule Take 2,000 mg by mouth See admin instructions. 2000 mg prior to dental appointment     atropine 1 % ophthalmic solution Place 1 drop into both eyes 3 (three) times daily as needed (dry eyes).     Blood Glucose Monitoring Suppl (ACCU-CHEK GUIDE ME) w/Device KIT 4 (four) times daily. for testing     Calcium Carbonate Antacid (TUMS PO) Take 2 tablets by mouth 3 (three) times daily as needed (acid indigestion).      carvedilol (COREG) 6.25 MG tablet Take 1 tablet (6.25 mg total) by mouth 2 (two) times daily with a meal. 180 tablet 3   cloNIDine (CATAPRES) 0.1 MG tablet Take 0.1 mg by mouth 2 (two) times daily.  Continuous Blood Gluc Receiver (FREESTYLE LIBRE 14 DAY READER) DEVI 1 each by Other route as needed (blood glucose).     diclofenac Sodium (VOLTAREN) 1 % GEL Apply 1 application. topically daily as needed (pain). Apply to feet for pain as needed 150 g 2   ezetimibe (ZETIA) 10 MG tablet Take 1 tablet (10 mg total) by mouth daily. 90 tablet 3   ferrous sulfate 325 (65 FE) MG tablet Take 325 mg by mouth in the morning and at bedtime.     gabapentin (NEURONTIN) 600 MG tablet Take 0.5 tablets (300 mg total) by mouth 3 (three) times  daily. 90 tablet 2   Glucagon 3 MG/DOSE POWD Place into the nose as needed.     glucose blood (ACCU-CHEK AVIVA PLUS) test strip 1 each by Other route 2 (two) times daily. And lancets 2/day 200 each 3   hydroxypropyl methylcellulose / hypromellose (ISOPTO TEARS / GONIOVISC) 2.5 % ophthalmic solution Place 1 drop into both eyes 3 (three) times daily as needed for dry eyes.     insulin degludec (TRESIBA FLEXTOUCH) 200 UNIT/ML FlexTouch Pen Inject 14 Units into the skin daily in the afternoon.     insulin lispro (HUMALOG) 100 UNIT/ML injection Inject 1-10 Units into the skin 3 (three) times daily before meals. Per sliding scale     isosorbide mononitrate (IMDUR) 120 MG 24 hr tablet Take 1 tablet (120 mg total) by mouth daily. 90 tablet 3   lidocaine (LIDODERM) 5 % PLACE 1 PATCH ONTO SKIN DAILY, REMOVE AND DISCARD WITHIN 12 HOURS OR AS DIRECTED 90 patch 1   lidocaine-prilocaine (EMLA) cream Apply 1 application topically 4 (four) times daily as needed (back pain).     nitroGLYCERIN (NITROSTAT) 0.4 MG SL tablet PLACE 1 TABLET UNDER THE TONGUE EVERY 5 MINUTES AS NEEDED FOR CHEST PAIN 25 tablet 7   NURTEC 75 MG TBDP Take 75 mg by mouth daily as needed (migraine).     oxyCODONE-acetaminophen (PERCOCET) 10-325 MG tablet Take 1 tablet by mouth 4 (four) times daily as needed for pain.   0   pantoprazole (PROTONIX) 40 MG tablet TAKE 1 TABLET BY MOUTH TWICE DAILY BEFORE A MEAL 180 tablet 0   polyethylene glycol powder (MIRALAX) 17 GM/SCOOP powder Take 17 g by mouth daily as needed for mild constipation.     potassium chloride SA (KLOR-CON) 20 MEQ tablet Take 0.5 tablets (10 mEq total) by mouth daily. Start on 7/2 90 tablet 1   PRESCRIPTION MEDICATION Apply 1 application topically 4 (four) times daily as needed (pain). Cream compounded at [facility] - B5, D3 G10, K10 Pent+ - apply 1 gram (1 gram = 1 pump) to affected area 4 times daily as needed for pain     REPATHA SURECLICK 817 MG/ML SOAJ INJECT 1 PEN INTO  THE SKIN EVERY 14 DAYS 6 mL 3   rosuvastatin (CRESTOR) 40 MG tablet Take 1 tablet (40 mg total) by mouth at bedtime.     SPIRIVA RESPIMAT 2.5 MCG/ACT AERS INHALE 2 PUFFS INTO THE LUNGS DAILY 4 g 5   torsemide (DEMADEX) 20 MG tablet TAKE 20 MG IN THE AM AND 10 MG IN THE PM BY MOUTH 135 tablet 3   Vitamin D, Ergocalciferol, (DRISDOL) 50000 units CAPS capsule Take 50,000 Units by mouth every Wednesday.   10   XARELTO 15 MG TABS tablet Take 15 mg by mouth daily.     No current facility-administered medications on file prior to visit.   Allergies  Allergen Reactions   Lisinopril Hives   Losartan Other (See Comments)    Pt reports causes GI issues   Sodium Bicarbonate Other (See Comments)    Pt reports causes GI issues      Objective: General: Patient is awake, alert, and oriented x 3 and in no acute distress.  Integument: Skin is warm, dry and supple bilateral. Nails are tender, long, thickened and dystrophic with subungual debris, consistent with onychomycosis, 1-5 bilateral. No signs of infection. Mild callus to bilateral hallux, remaining integument unremarkable.  Vasculature:  Dorsalis Pedis pulse 1/4 bilateral. Posterior Tibial pulse  1/4 bilateral. Capillary fill time <3 sec 1-5 bilateral. Positive hair growth to the level of the digits.Temperature gradient within normal limits.  1+ pitting edema bilateral ankles however easily displaced to be able to lightly palpate the pulses.  Pitting edema is worse on the left as compared to the right.  Neurology: The patient has diminished sensation measured with a 5.07/10g Semmes Weinstein Monofilament at all pedal sites bilateral. Vibratory sensation diminished bilateral with tuning fork. No Babinski sign present bilateral.   Musculoskeletal: Asymptomatic hallux limitus, pes planus, hammertoe pedal deformities noted bilateral. Muscular strength 4/5 in all lower extremity muscular groups bilateral without pain on range of motion.  Pain with  walking calf that goes away with rest concerning for claudication.  No tenderness with calf compression bilateral.  No acute signs of DVT.  Assessment and Plan: Problem List Items Addressed This Visit       Cardiovascular and Mediastinum   PAD (peripheral artery disease) ([place])     Endocrine   Diabetes mellitus, type II ([place])   Other Visit Diagnoses     Pain due to onychomycosis of toenails of both feet    -  Primary       -Examined patient. -Re-Discussed and educated patient on diabetic foot care  -Mechanically debrided all nails 1-5 bilateral using sterile nail nipper and filed with dremel without incident  -Patient to return  in 3 months for at risk foot nail care and callus care -Patient advised to call the office if any problems or questions arise in the meantime.  Lorenda Peck, DPM

## 2022-06-26 ENCOUNTER — Other Ambulatory Visit: Payer: Self-pay | Admitting: Cardiology

## 2022-08-04 ENCOUNTER — Ambulatory Visit (HOSPITAL_BASED_OUTPATIENT_CLINIC_OR_DEPARTMENT_OTHER)
Admission: RE | Admit: 2022-08-04 | Discharge: 2022-08-04 | Disposition: A | Discharge status: Home / Self Care | Payer: Medicare Other | Source: Ambulatory Visit | Attending: Cardiovascular Disease | Admitting: Cardiovascular Disease

## 2022-08-04 ENCOUNTER — Ambulatory Visit (HOSPITAL_COMMUNITY)
Admission: RE | Admit: 2022-08-04 | Discharge: 2022-08-04 | Disposition: A | Discharge status: Home / Self Care | Payer: Medicare Other | Source: Ambulatory Visit | Attending: Internal Medicine | Admitting: Internal Medicine

## 2022-08-04 DIAGNOSIS — I739 Peripheral vascular disease, unspecified: Secondary | ICD-10-CM

## 2022-08-04 DIAGNOSIS — Z95820 Peripheral vascular angioplasty status with implants and grafts: Secondary | ICD-10-CM | POA: Diagnosis not present

## 2022-08-07 ENCOUNTER — Other Ambulatory Visit: Payer: Self-pay | Admitting: *Deleted

## 2022-08-07 DIAGNOSIS — I739 Peripheral vascular disease, unspecified: Secondary | ICD-10-CM

## 2022-09-09 ENCOUNTER — Ambulatory Visit: Payer: Medicare Other | Admitting: Podiatry

## 2022-09-17 ENCOUNTER — Ambulatory Visit (HOSPITAL_COMMUNITY)
Admission: RE | Admit: 2022-09-17 | Discharge: 2022-09-17 | Disposition: A | Discharge status: Home / Self Care | Payer: Medicare Other | Source: Ambulatory Visit | Attending: Cardiology | Admitting: Cardiology

## 2022-09-17 DIAGNOSIS — I6523 Occlusion and stenosis of bilateral carotid arteries: Secondary | ICD-10-CM | POA: Insufficient documentation
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
## 2022-09-25 ENCOUNTER — Telehealth: Payer: Self-pay | Admitting: Physician Assistant

## 2022-09-25 ENCOUNTER — Encounter: Payer: Self-pay | Admitting: Cardiology

## 2022-09-25 ENCOUNTER — Ambulatory Visit: Payer: Medicare Other | Attending: Cardiology | Admitting: Cardiology

## 2022-09-25 VITALS — BP 126/60 | HR 78 | Ht 63.0 in | Wt 142.6 lb

## 2022-09-25 DIAGNOSIS — I6523 Occlusion and stenosis of bilateral carotid arteries: Secondary | ICD-10-CM

## 2022-09-25 DIAGNOSIS — E7800 Pure hypercholesterolemia, unspecified: Secondary | ICD-10-CM

## 2022-09-25 DIAGNOSIS — N1831 Chronic kidney disease, stage 3a: Secondary | ICD-10-CM

## 2022-09-25 DIAGNOSIS — I739 Peripheral vascular disease, unspecified: Secondary | ICD-10-CM

## 2022-09-25 DIAGNOSIS — Z86718 Personal history of other venous thrombosis and embolism: Secondary | ICD-10-CM | POA: Diagnosis not present

## 2022-09-25 DIAGNOSIS — I2510 Atherosclerotic heart disease of native coronary artery without angina pectoris: Secondary | ICD-10-CM

## 2022-09-25 DIAGNOSIS — I10 Essential (primary) hypertension: Secondary | ICD-10-CM | POA: Diagnosis not present

## 2022-09-25 LAB — LIPID PANEL
Chol/HDL Ratio: 3 ratio (ref 0.0–4.4)
Cholesterol, Total: 182 mg/dL (ref 100–199)
HDL: 60 mg/dL (ref >39)
LDL Chol Calc (NIH): 97 mg/dL (ref 0–99)
Triglycerides: 142 mg/dL (ref 0–149)
VLDL Cholesterol Cal: 25 mg/dL (ref 5–40)

## 2022-09-25 LAB — ALT: ALT: 16 IU/L (ref 0–32)

## 2022-09-25 NOTE — Patient Instructions (Signed)
Medication Instructions:  Your physician recommends that you continue on your current medications as directed. Please refer to the Current Medication list given to you today.  *If you need a refill on your cardiac medications before your next appointment, please call your pharmacy*  Lab Work: TODAY: FLP, ALT If you have labs (blood work) drawn today and your tests are completely normal, you will receive your results only by: [place] (if you have MyChart) OR A paper copy in the mail If you have any lab test that is abnormal or we need to change your treatment, we will call you to review the results.  Follow-Up: At [facility], you and your health needs are our priority.  As part of our continuing mission to provide you with exceptional heart care, we have created designated Provider Care Teams.  These Care Teams include your primary Cardiologist (physician) and Advanced Practice Providers (APPs -  Physician Assistants and Nurse Practitioners) who all work together to provide you with the care you need, when you need it.  Your next appointment:   1 year(s)  The format for your next appointment:   In Person  Provider:   Fransico Him, MD      Important Information About Sugar

## 2022-09-25 NOTE — Telephone Encounter (Signed)
Called and spoke with patient who understands results and need to repeat study in 1 year. Order placed at this time. No further questions.

## 2022-09-25 NOTE — Telephone Encounter (Signed)
-----   Message from Liliane Shi, [place] sent at 09/24/2022  6:05 PM EDT ----- Reviewed with Dr. Fletcher Anon. No new recommendations. Continue current medications. Make sure she has a repeat US in 1 year.  Richardson Dopp, PA-C    09/24/2022 6:05 PM

## 2022-09-25 NOTE — Addendum Note (Signed)
Addended by: Bernestine Amass on: 09/25/2022 09:16 AM   Modules accepted: Orders

## 2022-09-26 ENCOUNTER — Telehealth: Payer: Self-pay | Admitting: Cardiology

## 2022-09-26 DIAGNOSIS — E7800 Pure hypercholesterolemia, unspecified: Secondary | ICD-10-CM

## 2022-09-26 NOTE — Telephone Encounter (Signed)
-----   Message from Sueanne Margarita, MD sent at 09/25/2022  5:49 PM EDT ----- Lipids still not at goal.  Please forward to lipid clinic for further recommendations.

## 2022-09-26 NOTE — Telephone Encounter (Signed)
Left detailed message with lab results and Dr. Theodosia Blender recommendation for Lipid Clinic referral.  Lipid Clinic referral placed, scheduler to call patient and setup appt.  Provided office number for callback if any questions.

## 2022-11-05 ENCOUNTER — Ambulatory Visit: Payer: Medicare Other | Attending: Cardiovascular Disease | Admitting: Pharmacist

## 2022-11-05 DIAGNOSIS — E7800 Pure hypercholesterolemia, unspecified: Secondary | ICD-10-CM | POA: Diagnosis not present

## 2022-11-05 MED ORDER — EZETIMIBE 10 MG PO TABS: 10.0000 mg | ORAL_TABLET | Freq: Every day | ORAL | 3 refills | Status: DC

## 2022-11-05 MED ORDER — REPATHA SURECLICK 140 MG/ML ~~LOC~~ SOAJ: SUBCUTANEOUS | 3 refills | Status: DC

## 2022-11-05 MED ORDER — ROSUVASTATIN CALCIUM 40 MG PO TABS: 40.0000 mg | ORAL_TABLET | Freq: Every day | ORAL | 3 refills | Status: DC

## 2022-11-05 NOTE — Patient Instructions (Addendum)
Your LDL cholesterol goal is <55  Continue taking rosuvastatin (Crestor) '40mg'$  daily  Stay on your Repatha injections every 2 weeks  Restart your ezetimibe (Zetia) '10mg'$  daily  Recheck fasting cholesterol at your primary care office in about 2-3 months

## 2022-11-05 NOTE — Progress Notes (Signed)
Patient ID: Alejandra Hernandez                 DOB: June 29, 1956                    MRN: [id number]     HPI: Alejandra Hernandez is a 66 y.o. female patient referred to lipid clinic by Dr Radford Pax. PMH is significant for CAD s/p PCI/DES to RCA 05/2017 (patent on last [facility] 04/2018), PAD with LE stenting in 2017, 12/2017, and 09/2018, right subclavian stenosis, CKD III, HTN, HLD,  DM type 1 with peripheral neuropathy, bilateral DVT 12/2016 on chronic Xarelto, GIB (01/2016 in setting of ABL anemia/PUD), GERD, gout, and tobacco abuse. 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 Scheduled for myoview and echo on 04/18/21 - stress test was low risk, EF 55-65%. I saw pt in June 2022 and started her on Repatha as her LDL remained above goal on rosuvastatin 50m daily and ezetimibe 114mdaily. LDL improved from 115 to 62, however on most recent lab draw last month increased back to 97 and pt was re-referred to PharmD.   Pt presents today in good spirits. Thinks she missed a dose or two of her Repatha before her labs were drawn, and has been out of her Zetia for a while. Has been adherent to her rosuvastatin. Continuing with the same insurance plan next year - has both Medicare and Medicaid. Has leg and foot pain from her neuropathy and PAD so her exercise is limited.   Current Medications: rosuvastatin 4017maily, ezetimibe 10m38mily, Repatha 140mg42m - not taking ezetimibe for a while and missed Repatha doses Risk Factors: CAD s/p PCI at age 18, P22 with stenting x3, subclavian stenosis, HTN, CKD, DM, tobacco use LDL goal: <55mg/79m Diet: Breakfast-  eggs or oatmeal. Snack - nabs or fruit. Dinner - decreased fried food to 1x per week, likes fish and chicken.   Exercise: minimal - leg and feet pain   Family History: Diabetes in her mother and sister; Heart disease in her mother; Kidney failure in her mother; Liver cancer in her sister; Thyroid  cancer in her mother. There is no history of Colon cancer or Stomach cancer.   Social History: Formerly smoked 1 PPD for 43 years, now smoking 2 cigarettes per day. Denies alcohol and drug use  Labs: 09/25/22: TC 182, TG 142, HDL 60, LDL 97 (rosuvastatin 40mg d21m, ezetimibe 10mg da69m Repatha 140mg Q2W51m8/22: TC 192, TG 81, HDL 116, LDL 62 (rosuvastatin 40mg dail16mzetimibe 10mg daily28mpatha 140mg Q2W) 519m22: TC 252, TG 111, HDL 119, LDL 115 (rosuvastatin 40mg daily, 55mimibe 10mg daily)  81m Medical History:  Diagnosis Date   Anemia    Anxiety    Arthritis    "right knee, back, feet, hands" (10/20/2018)   Bleeding stomach ulcer 01/2016   CAD (coronary artery disease)    05/19/17 PCI with DESx1 to mRCA   [facility]    40-59%[place]ht and 1-39% left by doppler 05/2018 with stenotic right subclavian artery // Carotid US 09/17/2021:[place]CA 40-59; right subclavian stenosis   Chronic lower back pain    CKD (chronic kidney disease) stage 3, GFR 30-59 ml/min (HCC) 01/2016  [place]    Diabetic peripheral neuropathy (HCC)    DVT (deep venous thrombosis) (HCC) 12[place]2016  [place]   GERD (gastroesophageal reflux  disease)    GI bleed    Gout    Headache    "weekly" (11/27//2019)   History of blood transfusion 2017   "related to stomach bleeding"   Hyperlipidemia    Hypertension    NSTEMI (non-ST elevated myocardial infarction) ([place]) 05/2017   PAD (peripheral artery disease) ([place])    a. LE stenting in 2017, 12/2017, 09/2018 - Dr. Fletcher Anon   Pneumonia    "several times" (10/20/2018)   Renal artery stenosis ([place])    a.  mild to moderate RAS by aortogram in 2017 (no evidence of this on duplex 06/2018).   Stenosis of right subclavian artery (HCC)    Type II diabetes mellitus ([place])     Current Outpatient Medications on File Prior to Visit  Medication Sig Dispense Refill   albuterol (PROAIR HFA) 108 (90 Base) MCG/ACT inhaler Inhale 2 puffs into the lungs every 4  (four) hours as needed for wheezing or shortness of breath. 1 Inhaler 5   amitriptyline (ELAVIL) 25 MG tablet Take 75 mg by mouth at bedtime.      amLODipine (NORVASC) 10 MG tablet Take 10 mg by mouth at bedtime.     amoxicillin (AMOXIL) 500 MG capsule Take 2,000 mg by mouth See admin instructions. 2000 mg prior to dental appointment     atropine 1 % ophthalmic solution Place 1 drop into both eyes 3 (three) times daily as needed (dry eyes).     Blood Glucose Monitoring Suppl (ACCU-CHEK GUIDE ME) w/Device KIT 4 (four) times daily. for testing     Calcium Carbonate Antacid (TUMS PO) Take 2 tablets by mouth 3 (three) times daily as needed (acid indigestion).      carvedilol (COREG) 6.25 MG tablet Take 1 tablet (6.25 mg total) by mouth 2 (two) times daily with a meal. 180 tablet 3   cloNIDine (CATAPRES) 0.1 MG tablet Take 0.1 mg by mouth 2 (two) times daily.     Continuous Blood Gluc Receiver (FREESTYLE LIBRE 14 DAY READER) DEVI 1 each by Other route as needed (blood glucose).     diclofenac Sodium (VOLTAREN) 1 % GEL Apply 1 application. topically daily as needed (pain). Apply to feet for pain as needed 150 g 2   ezetimibe (ZETIA) 10 MG tablet Take 1 tablet (10 mg total) by mouth daily. 90 tablet 3   ferrous sulfate 325 (65 FE) MG tablet Take 325 mg by mouth in the morning and at bedtime.     furosemide (LASIX) 20 MG tablet Take 20 mg by mouth 2 (two) times daily.     gabapentin (NEURONTIN) 600 MG tablet Take 0.5 tablets (300 mg total) by mouth 3 (three) times daily. 90 tablet 2   Glucagon 3 MG/DOSE POWD Place into the nose as needed.     glucose blood (ACCU-CHEK AVIVA PLUS) test strip 1 each by Other route 2 (two) times daily. And lancets 2/day 200 each 3   hydroxypropyl methylcellulose / hypromellose (ISOPTO TEARS / GONIOVISC) 2.5 % ophthalmic solution Place 1 drop into both eyes 3 (three) times daily as needed for dry eyes.     insulin degludec (TRESIBA FLEXTOUCH) 200 UNIT/ML FlexTouch Pen Inject 14  Units into the skin daily in the afternoon.     insulin lispro (HUMALOG) 100 UNIT/ML injection Inject 1-10 Units into the skin 3 (three) times daily before meals. Per sliding scale     isosorbide mononitrate (IMDUR) 120 MG 24 hr tablet Take 1 tablet (120 mg total) by mouth daily. [place]  tablet 3   lidocaine (LIDODERM) 5 % PLACE 1 PATCH ONTO SKIN DAILY, REMOVE AND DISCARD WITHIN 12 HOURS OR AS DIRECTED 90 patch 1   lidocaine-prilocaine (EMLA) cream Apply 1 application topically 4 (four) times daily as needed (back pain).     nitroGLYCERIN (NITROSTAT) 0.4 MG SL tablet PLACE 1 TABLET UNDER THE TONGUE EVERY 5 MINUTES AS NEEDED FOR CHEST PAIN 25 tablet 7   NURTEC 75 MG TBDP Take 75 mg by mouth daily as needed (migraine).     oxyCODONE-acetaminophen (PERCOCET) 10-325 MG tablet Take 1 tablet by mouth 4 (four) times daily as needed for pain.   0   pantoprazole (PROTONIX) 40 MG tablet TAKE 1 TABLET BY MOUTH TWICE DAILY BEFORE A MEAL 180 tablet 0   polyethylene glycol powder (MIRALAX) 17 GM/SCOOP powder Take 17 g by mouth daily as needed for mild constipation.     potassium chloride SA (KLOR-CON) 20 MEQ tablet Take 0.5 tablets (10 mEq total) by mouth daily. Start on 7/2 90 tablet 1   PRESCRIPTION MEDICATION Apply 1 application topically 4 (four) times daily as needed (pain). Cream compounded at [place] - B5, D3 G10, K10 Pent+ - apply 1 gram (1 gram = 1 pump) to affected area 4 times daily as needed for pain     REPATHA SURECLICK 711 MG/ML SOAJ INJECT 1 PEN INTO THE SKIN EVERY 14 DAYS 6 mL 3   rosuvastatin (CRESTOR) 40 MG tablet Take 1 tablet (40 mg total) by mouth at bedtime. 90 tablet 2   SPIRIVA RESPIMAT 2.5 MCG/ACT AERS INHALE 2 PUFFS INTO THE LUNGS DAILY 4 g 5   Vitamin D, Ergocalciferol, (DRISDOL) 50000 units CAPS capsule Take 50,000 Units by mouth every Wednesday.   10   XARELTO 15 MG TABS tablet Take 15 mg by mouth daily.     No current facility-administered medications on file prior to visit.     Allergies  Allergen Reactions   Lisinopril Hives   Losartan Other (See Comments)    Pt reports causes GI issues   Sodium Bicarbonate Other (See Comments)    Pt reports causes GI issues    Assessment/Plan:  1. Hyperlipidemia - LDL 97 above goal < 55 on most recent labs, however pt missed a dose or two of her Repatha and ran out of her Zetia a while ago. Will restart Zetia 25m daily, encouraged adherence with Repatha 1437mQ2W injections, and will continue rosuvastatin 4083maily. She sees her PCP monthly and will ask him to recheck fasting cholesterol levels in 2-3 months. Combination of meds should bring LDL to goal. Had previously avoided Nexletol due to pt's gout hx. Repatha PA reapproved through 11/24/23.  Elta Angell E. Pesach Frisch, PharmD, BCACP, CPPCokeville2Larch Wayhu[address] DrivereFairviewC [id number]: [phone]: [phone]/13/2023 2:31 PM

## 2023-01-08 ENCOUNTER — Encounter: Payer: Self-pay | Admitting: Hematology and Oncology

## 2023-01-19 ENCOUNTER — Ambulatory Visit: Payer: 59 | Admitting: Podiatry

## 2023-01-27 ENCOUNTER — Ambulatory Visit (INDEPENDENT_AMBULATORY_CARE_PROVIDER_SITE_OTHER): Payer: 59 | Admitting: Podiatry

## 2023-01-27 ENCOUNTER — Encounter: Payer: Self-pay | Admitting: Podiatry

## 2023-01-27 DIAGNOSIS — M79675 Pain in left toe(s): Secondary | ICD-10-CM | POA: Diagnosis not present

## 2023-01-27 DIAGNOSIS — B351 Tinea unguium: Secondary | ICD-10-CM

## 2023-01-27 DIAGNOSIS — E1159 Type 2 diabetes mellitus with other circulatory complications: Secondary | ICD-10-CM | POA: Diagnosis not present

## 2023-01-27 DIAGNOSIS — I739 Peripheral vascular disease, unspecified: Secondary | ICD-10-CM | POA: Diagnosis not present

## 2023-01-27 DIAGNOSIS — M79674 Pain in right toe(s): Secondary | ICD-10-CM

## 2023-01-27 NOTE — Progress Notes (Signed)
Subjective: Alejandra Hernandez is a 67 y.o. female patient with history of diabetes who presents to office today complaining of long,mildly painful nails  while ambulating in shoes; unable to trim. Last A1c was 10.3.  Patient reports that her last visit to PCP was on  08/28/21.  No other pedal complaints noted.  Patient Active Problem List   Diagnosis Date Noted   Chronic anticoagulation 08/03/2021   Nausea vomiting and diarrhea 05/19/2021   Dehydration 05/19/2021   Hyponatremia 05/18/2021   Hypomagnesemia 12/27/2020   Abnormal findings on diagnostic imaging of other specified body structures 08/27/2020   Disorder of mineral metabolism, unspecified 08/27/2020   Personal history of other venous thrombosis and embolism 08/27/2020   Pulmonary nodule 08/03/2020   Acidosis 07/06/2020   Complaints of total body pain 07/10/2019   Pain, unspecified 07/10/2019   Left upper extremity swelling 06/03/2019   COPD, mild ([place]) 05/26/2019   Leukocytosis 04/14/2019   Avascular necrosis of humeral head, left ([place]) 04/14/2019   Hypokalemia 04/14/2019   Neuropathy 04/14/2019   DKA (diabetic ketoacidoses) 04/13/2019   Polyp of transverse colon    Atypical chest pain 11/16/2018   Diabetes mellitus, type II ([place]) 05/23/2018   CKD (chronic kidney disease), stage III ([place]) 05/14/2018   Unstable angina ([place]) 05/14/2018   Hyperglycemia 12/23/2017   Low TSH level 06/22/2017   CAD (coronary artery disease)    Nodule on liver 05/22/2017   Liver disease, unspecified 05/22/2017   Heart palpitations 05/14/2017   Unspecified skin changes 04/18/2017   Tobacco use 03/17/2017   Benign neoplasm of descending colon    Benign neoplasm of sigmoid colon    Chronic diastolic CHF (congestive heart failure) ([place]) 02/09/2017   History of DVT (deep vein thrombosis)    Abnormal CT scan, chest    Acute urinary retention    Claudication ([place]) 07/23/2016   PAD (peripheral artery disease) ([place])    Essential hypertension  05/14/2016   Uncontrolled type 2 diabetes mellitus with ketoacidosis without coma, with long-term current use of insulin (HCC)    GERD (gastroesophageal reflux disease) 05/17/2014   Carotid artery disease ([place]) 05/17/2014   Iron deficiency anemia 04/26/2014   Dyspnea 04/25/2014   Carotid artery bruit 04/25/2014   Hyperlipidemia    Gout    Current Outpatient Medications on File Prior to Visit  Medication Sig Dispense Refill   albuterol (PROAIR HFA) 108 (90 Base) MCG/ACT inhaler Inhale 2 puffs into the lungs every 4 (four) hours as needed for wheezing or shortness of breath. 1 Inhaler 5   amitriptyline (ELAVIL) 25 MG tablet Take 75 mg by mouth at bedtime.      amLODipine (NORVASC) 10 MG tablet Take 10 mg by mouth at bedtime.     amoxicillin (AMOXIL) 500 MG capsule Take 2,000 mg by mouth See admin instructions. 2000 mg prior to dental appointment     atropine 1 % ophthalmic solution Place 1 drop into both eyes 3 (three) times daily as needed (dry eyes).     Blood Glucose Monitoring Suppl (ACCU-CHEK GUIDE ME) w/Device KIT 4 (four) times daily. for testing     Calcium Carbonate Antacid (TUMS PO) Take 2 tablets by mouth 3 (three) times daily as needed (acid indigestion).      carvedilol (COREG) 6.25 MG tablet Take 1 tablet (6.25 mg total) by mouth 2 (two) times daily with a meal. 180 tablet 3   cloNIDine (CATAPRES) 0.1 MG tablet Take 0.1 mg by mouth 2 (two) times daily.  Continuous Blood Gluc Receiver (FREESTYLE LIBRE 14 DAY READER) DEVI 1 each by Other route as needed (blood glucose).     diclofenac Sodium (VOLTAREN) 1 % GEL Apply 1 application. topically daily as needed (pain). Apply to feet for pain as needed 150 g 2   Evolocumab (REPATHA SURECLICK) [id number] MG/ML SOAJ INJECT 1 PEN INTO THE SKIN EVERY 14 DAYS 6 mL 3   ezetimibe (ZETIA) 10 MG tablet Take 1 tablet (10 mg total) by mouth daily. 90 tablet 3   ferrous sulfate 325 (65 FE) MG tablet Take 325 mg by mouth in the morning and at bedtime.      furosemide (LASIX) 20 MG tablet Take 20 mg by mouth 2 (two) times daily.     gabapentin (NEURONTIN) 600 MG tablet Take 0.5 tablets (300 mg total) by mouth 3 (three) times daily. 90 tablet 2   Glucagon 3 MG/DOSE POWD Place into the nose as needed.     glucose blood (ACCU-CHEK AVIVA PLUS) test strip 1 each by Other route 2 (two) times daily. And lancets 2/day 200 each 3   hydroxypropyl methylcellulose / hypromellose (ISOPTO TEARS / GONIOVISC) 2.5 % ophthalmic solution Place 1 drop into both eyes 3 (three) times daily as needed for dry eyes.     insulin degludec (TRESIBA FLEXTOUCH) 200 UNIT/ML FlexTouch Pen Inject 14 Units into the skin daily in the afternoon.     insulin lispro (HUMALOG) 100 UNIT/ML injection Inject 1-10 Units into the skin 3 (three) times daily before meals. Per sliding scale     isosorbide mononitrate (IMDUR) 120 MG 24 hr tablet Take 1 tablet (120 mg total) by mouth daily. 90 tablet 3   lidocaine (LIDODERM) 5 % PLACE 1 PATCH ONTO SKIN DAILY, REMOVE AND DISCARD WITHIN 12 HOURS OR AS DIRECTED 90 patch 1   lidocaine-prilocaine (EMLA) cream Apply 1 application topically 4 (four) times daily as needed (back pain).     nitroGLYCERIN (NITROSTAT) 0.4 MG SL tablet PLACE 1 TABLET UNDER THE TONGUE EVERY 5 MINUTES AS NEEDED FOR CHEST PAIN 25 tablet 7   NURTEC 75 MG TBDP Take 75 mg by mouth daily as needed (migraine).     oxyCODONE-acetaminophen (PERCOCET) 10-325 MG tablet Take 1 tablet by mouth 4 (four) times daily as needed for pain.   0   pantoprazole (PROTONIX) 40 MG tablet TAKE 1 TABLET BY MOUTH TWICE DAILY BEFORE A MEAL 180 tablet 0   polyethylene glycol powder (MIRALAX) 17 GM/SCOOP powder Take 17 g by mouth daily as needed for mild constipation.     potassium chloride SA (KLOR-CON) 20 MEQ tablet Take 0.5 tablets (10 mEq total) by mouth daily. Start on 7/2 90 tablet 1   PRESCRIPTION MEDICATION Apply 1 application topically 4 (four) times daily as needed (pain). Cream compounded at  [place] - B5, D3 G10, K10 Pent+ - apply 1 gram (1 gram = 1 pump) to affected area 4 times daily as needed for pain     rosuvastatin (CRESTOR) 40 MG tablet Take 1 tablet (40 mg total) by mouth at bedtime. 90 tablet 3   SPIRIVA RESPIMAT 2.5 MCG/ACT AERS INHALE 2 PUFFS INTO THE LUNGS DAILY 4 g 5   Vitamin D, Ergocalciferol, (DRISDOL) 50000 units CAPS capsule Take 50,000 Units by mouth every Wednesday.   10   XARELTO 15 MG TABS tablet Take 15 mg by mouth daily.     No current facility-administered medications on file prior to visit.   Allergies  Allergen Reactions  Lisinopril Hives   Losartan Other (See Comments)    Pt reports causes GI issues   Sodium Bicarbonate Other (See Comments)    Pt reports causes GI issues      Objective: General: Patient is awake, alert, and oriented x 3 and in no acute distress.  Integument: Skin is warm, dry and supple bilateral. Nails are tender, long, thickened and dystrophic with subungual debris, consistent with onychomycosis, 1-5 bilateral. No signs of infection. Mild callus to bilateral hallux, remaining integument unremarkable.  Vasculature:  Dorsalis Pedis pulse 1/4 bilateral. Posterior Tibial pulse  1/4 bilateral. Capillary fill time <3 sec 1-5 bilateral. Positive hair growth to the level of the digits.Temperature gradient within normal limits.  1+ pitting edema bilateral ankles however easily displaced to be able to lightly palpate the pulses.  Pitting edema is worse on the left as compared to the right.  Neurology: The patient has diminished sensation measured with a 5.07/10g Semmes Weinstein Monofilament at all pedal sites bilateral. Vibratory sensation diminished bilateral with tuning fork. No Babinski sign present bilateral.   Musculoskeletal: Asymptomatic hallux limitus, pes planus, hammertoe pedal deformities noted bilateral. Muscular strength 4/5 in all lower extremity muscular groups bilateral without pain on range of motion.  Pain with  walking calf that goes away with rest concerning for claudication.  No tenderness with calf compression bilateral.  No acute signs of DVT.  Assessment and Plan: Problem List Items Addressed This Visit   None    -Examined patient. -Re-Discussed and educated patient on diabetic foot care  -Mechanically debrided all nails 1-5 bilateral using sterile nail nipper and filed with dremel without incident  -Patient to return  in 3 months for at risk foot nail care and callus care -Patient advised to call the office if any problems or questions arise in the meantime.  Lorenda Peck, DPM

## 2023-02-06 ENCOUNTER — Encounter: Payer: Self-pay | Admitting: Cardiovascular Disease

## 2023-02-06 ENCOUNTER — Telehealth: Payer: Self-pay | Admitting: Cardiology

## 2023-02-06 NOTE — Telephone Encounter (Signed)
Error

## 2023-02-06 NOTE — Telephone Encounter (Signed)
Patient states yesterday she had a cholesterol panel with PCP, Antonietta Jewel, MD - [phone]. She would like to have our pharmacist contact PCP's office to discuss results. She states this was previously discussed with Megan Supple.

## 2023-02-06 NOTE — Telephone Encounter (Signed)
Called # listed and received message that their office is closed. Called pt to see if office had given her cholesterol results since I cannot see them in [place], [place], [facility] tab, or [facility]where. She states her PCP office closes at 11:30am on Fridays and she does not have results yet. Reports adherence to her rosuvastatin 40mg  daily, ezetimibe 10mg  daily, and Repatha 140mg  Q2W. Hopefully labs will be at goal, will call office again on Monday.

## 2023-02-09 NOTE — Telephone Encounter (Signed)
Called PCP office, they will fax over pt's lipid labs.

## 2023-02-09 NOTE — Telephone Encounter (Signed)
Labs received - TC 170, HDL 102, TG 204, LDL 41, non HDL 68, CK mildly elevated at 177. Glucose 240, SCr 2.1.  Called pt to see if she was fasting - she was not. LDL well controlled at goal < 55 but TG elevated likely due to non-fasting state, previously normal on last few lab panels with [place]. They did check CK which was slightly elevated, pt reports some aches but that she's had them for a while even before starting cholesterol meds. Will continue her meds, advised her to let us know if her PCP advises her to make any changes.

## 2023-03-23 ENCOUNTER — Encounter: Payer: Self-pay | Admitting: Hematology and Oncology

## 2023-03-23 ENCOUNTER — Telehealth: Payer: Self-pay | Admitting: Cardiovascular Disease

## 2023-03-23 DIAGNOSIS — I739 Peripheral vascular disease, unspecified: Secondary | ICD-10-CM

## 2023-03-23 NOTE — Telephone Encounter (Signed)
Pt c/o of Chest Pain: STAT if CP now or developed within 24 hours  1. Are you having CP right now?  No  2. Are you experiencing any other symptoms (ex. SOB, nausea, vomiting, sweating)?   Swelling in right leg  3. How long have you been experiencing CP?   Going on for a while, patient states she is unable to sleep  4. Is your CP continuous or coming and going?   Continuous  5. Have you taken Nitroglycerin?  Not yet  Patient states she is having pain in her calf and thigh and feels pretty bad.  Patient stated she is having swelling in right leg but could not tell how much.  Patient is taking a fluid pill but no shortness of breath.

## 2023-03-23 NOTE — Telephone Encounter (Signed)
Patient states swelling to right leg for couple of leg from foot to upper thigh at hip bone.  Has a stent in thigh and calf. Pain in calf and thigh. Hard to touch, tight, and  slightly shiny.  Not pitting, slight swelling today.  Difficulty sleeping last night. Patient uses compression stockings and elevates often during the day.  She states this happened in the past and podiatry checked her and states poor circulation.This was in March. She states it happens periodically but this time kept her from sleeping as the whole right leg hurting.  Not bad enough to go to ED but bad enough to keep her you all night.  She takes tylenol and percocet for pain.  Taken last night which helped a little bit. Pain level at this time is 6/10.   Patient instructed if pain worsens, color change to extremity or turns cold, to go to ED. Otherwise will call with recommendations

## 2023-03-25 NOTE — Telephone Encounter (Signed)
Schedule an urgent ABI and LEA to see what is going on.

## 2023-03-25 NOTE — Telephone Encounter (Signed)
The patient has been called and made aware. The soonest the patient can get in is 5/16. Openings have been checked at other locations as well.   The patient has been made aware to go to the ED for worsening symptoms such as skin discoloration, loss of feeling, temperature change, and decreased pulses. She will keep the office updated.

## 2023-03-26 ENCOUNTER — Other Ambulatory Visit (HOSPITAL_COMMUNITY): Payer: Self-pay | Admitting: Cardiovascular Disease

## 2023-03-26 DIAGNOSIS — I739 Peripheral vascular disease, unspecified: Secondary | ICD-10-CM

## 2023-04-02 ENCOUNTER — Encounter: Payer: Self-pay | Admitting: Hematology and Oncology

## 2023-04-09 ENCOUNTER — Ambulatory Visit (HOSPITAL_COMMUNITY)
Admission: RE | Admit: 2023-04-09 | Discharge: 2023-04-09 | Disposition: A | Discharge status: Home / Self Care | Payer: 59 | Source: Ambulatory Visit | Attending: Cardiovascular Disease | Admitting: Cardiovascular Disease

## 2023-04-09 ENCOUNTER — Ambulatory Visit (HOSPITAL_BASED_OUTPATIENT_CLINIC_OR_DEPARTMENT_OTHER)
Admission: RE | Admit: 2023-04-09 | Discharge: 2023-04-09 | Disposition: A | Discharge status: Home / Self Care | Payer: 59 | Source: Ambulatory Visit | Attending: Internal Medicine | Admitting: Internal Medicine

## 2023-04-09 DIAGNOSIS — I739 Peripheral vascular disease, unspecified: Secondary | ICD-10-CM | POA: Insufficient documentation

## 2023-04-09 DIAGNOSIS — Z95820 Peripheral vascular angioplasty status with implants and grafts: Secondary | ICD-10-CM | POA: Diagnosis not present

## 2023-04-10 LAB — VAS US ABI WITH/WO TBI
Left ABI: 0.92
Right ABI: 0.68

## 2023-04-21 ENCOUNTER — Ambulatory Visit: Payer: 59 | Attending: Cardiovascular Disease | Admitting: Cardiovascular Disease

## 2023-04-21 ENCOUNTER — Encounter: Payer: Self-pay | Admitting: Cardiovascular Disease

## 2023-04-21 VITALS — BP 148/62 | HR 81 | Ht 63.0 in | Wt 140.2 lb

## 2023-04-21 DIAGNOSIS — Z86718 Personal history of other venous thrombosis and embolism: Secondary | ICD-10-CM

## 2023-04-21 DIAGNOSIS — I739 Peripheral vascular disease, unspecified: Secondary | ICD-10-CM

## 2023-04-21 DIAGNOSIS — I10 Essential (primary) hypertension: Secondary | ICD-10-CM | POA: Diagnosis not present

## 2023-04-21 DIAGNOSIS — I2510 Atherosclerotic heart disease of native coronary artery without angina pectoris: Secondary | ICD-10-CM

## 2023-04-21 DIAGNOSIS — E785 Hyperlipidemia, unspecified: Secondary | ICD-10-CM
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
## 2023-04-21 NOTE — Patient Instructions (Signed)
Medication Instructions:  No changes *If you need a refill on your cardiac medications before your next appointment, please call your pharmacy*   Testing/Procedures: Your physician has requested that you have a peripheral vascular angiogram. This exam is performed at the hospital. During this exam IV contrast is used to look at arterial blood flow. Please review the information sheet given for details.  Follow-Up: At [facility], you and your health needs are our priority.  As part of our continuing mission to provide you with exceptional heart care, we have created designated Provider Care Teams.  These Care Teams include your primary Cardiologist (physician) and Advanced Practice Providers (APPs -  Physician Assistants and Nurse Practitioners) who all work together to provide you with the care you need, when you need it.  We recommend signing up for the patient portal called "MyChart".  Sign up information is provided on this After Visit Summary.  MyChart is used to connect with patients for Virtual Visits (Telemedicine).  Patients are able to view lab/test results, encounter notes, upcoming appointments, etc.  Non-urgent messages can be sent to your provider as well.   To learn more about what you can do with MyChart, go to [email or web address].    Your next appointment:   Keep your post procedure follow up on 7/2 at 3:35 pm  Other Instructions  [facility] A DEPT OF [facility] AT [facility] [id number] [facility] [place] [address] Dept: [phone] Loc: [phone]  Alejandra Hernandez  04/21/2023  You are scheduled for a Peripheral Angiogram on Wednesday, June 5 with Dr. Lorine Bears.  1. Please arrive at the [facility] (Main Entrance A) at [facility]: [address] [place], [place] [address] at 6:30 AM (This time is 4 hour(s) before your procedure to ensure your preparation).  Free valet parking service is available. You will check in at ADMITTING. The support person will be asked to wait in the waiting room.  It is OK to have someone drop you off and come back when you are ready to be discharged.    Special note: Every effort is made to have your procedure done on time. Please understand that emergencies sometimes delay scheduled procedures.  2. Diet: Do not eat solid foods after midnight.  The patient may have clear liquids until 5am upon the day of the procedure.  3. Labs: You will need to have blood drawn on 5/28. You do not need to be fasting.  4. Medication instructions in preparation for your procedure: Hold the Xarelto two days prior and the morning of the procedure (hold the 3rd and 4th) Hold the furosemide and the potassium the morning of the procedure Hold all diabetic medication the morning of the procedure  -take half the dose of the [place] the night before  On the morning of your procedure, take your Aspirin 81 mg and any morning medicines NOT listed above.  You may use sips of water.  5. Plan to go home the same day, you will only stay overnight if medically necessary. 6. Bring a current list of your medications and current insurance cards. 7. You MUST have a responsible person to drive you home. 8. Someone MUST be with you the first 24 hours after you arrive home or your discharge will be delayed. 9. Please wear clothes that are easy to get on and off and wear slip-on shoes.  Thank you for allowing [place] to  care for you!   -- [place] [facility]
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
## 2023-04-22 LAB — CBC
Hematocrit: 36.8 % (ref 34.0–46.6)
Hemoglobin: 11.1 g/dL (ref 11.1–15.9)
MCH: 21.9 pg — ABNORMAL LOW (ref 26.6–33.0)
MCHC: 30.2 g/dL — ABNORMAL LOW (ref 31.5–35.7)
MCV: 73 fL — ABNORMAL LOW (ref 79–97)
Platelets: 343 10*3/uL (ref 150–450)
RBC: 5.06 x10E6/uL (ref 3.77–5.28)
RDW: 18.8 % — ABNORMAL HIGH (ref 11.7–15.4)
WBC: 8.1 10*3/uL (ref 3.4–10.8)

## 2023-04-22 LAB — BASIC METABOLIC PANEL
BUN/Creatinine Ratio: 12 (ref 12–28)
BUN: 23 mg/dL (ref 8–27)
CO2: 19 mmol/L — ABNORMAL LOW (ref 20–29)
Calcium: 8.7 mg/dL (ref 8.7–10.3)
Chloride: 97 mmol/L (ref 96–106)
Creatinine, Ser: 1.96 mg/dL — ABNORMAL HIGH (ref 0.57–1.00)
Glucose: 242 mg/dL — ABNORMAL HIGH (ref 70–99)
Potassium: 4.7 mmol/L (ref 3.5–5.2)
Sodium: 129 mmol/L — ABNORMAL LOW (ref 134–144)
eGFR: 28 mL/min/{1.73_m2} — ABNORMAL LOW (ref >59)

## 2023-04-27 ENCOUNTER — Telehealth: Payer: Self-pay | Admitting: *Deleted

## 2023-04-27 NOTE — Telephone Encounter (Signed)
Abdominal Aortogram scheduled at [facility] for: Wednesday April 29, 2023 10:30 AM Arrival time [facility] Main Entrance A at: 6 AM-pre-procedure hydration  Nothing to eat after midnight prior to procedure, clear liquids until 5 AM day of procedure  Medication instructions: -Hold:  Xarelto-none 04/27/23 until post procedure  Lasix-day before and day of procedure-per protocol GFR  28  Insulin-AM of procedure/1/2 usual Insulin HS prior to procedure  -Other usual morning medications can be taken with sips of water including aspirin 81 mg.  Confirmed patient has responsible adult to drive home post procedure and be with patient first 24 hours after arriving home.  Plan to go home the same day, you will only stay overnight if medically necessary.  Left message for patient to call back to review procedure instructions

## 2023-04-28 ENCOUNTER — Ambulatory Visit: Payer: 59 | Admitting: Podiatry

## 2023-04-29 ENCOUNTER — Encounter (HOSPITAL_COMMUNITY)
Admission: RE | Disposition: A | Discharge status: Home / Self Care | Payer: Self-pay | Source: Home / Self Care | Attending: Cardiovascular Disease

## 2023-04-29 ENCOUNTER — Ambulatory Visit (HOSPITAL_COMMUNITY)
Admission: RE | Admit: 2023-04-29 | Discharge: 2023-04-29 | Disposition: A | Discharge status: Home / Self Care | Payer: 59 | Attending: Cardiovascular Disease | Admitting: Cardiovascular Disease

## 2023-04-29 DIAGNOSIS — I739 Peripheral vascular disease, unspecified: Secondary | ICD-10-CM

## 2023-04-29 DIAGNOSIS — E1151 Type 2 diabetes mellitus with diabetic peripheral angiopathy without gangrene: Secondary | ICD-10-CM | POA: Diagnosis present

## 2023-04-29 DIAGNOSIS — Z86718 Personal history of other venous thrombosis and embolism: Secondary | ICD-10-CM | POA: Insufficient documentation

## 2023-04-29 DIAGNOSIS — E1122 Type 2 diabetes mellitus with diabetic chronic kidney disease: Secondary | ICD-10-CM | POA: Insufficient documentation

## 2023-04-29 DIAGNOSIS — Z7901 Long term (current) use of anticoagulants: Secondary | ICD-10-CM | POA: Insufficient documentation

## 2023-04-29 DIAGNOSIS — I25118 Atherosclerotic heart disease of native coronary artery with other forms of angina pectoris: Secondary | ICD-10-CM | POA: Diagnosis not present

## 2023-04-29 DIAGNOSIS — I6523 Occlusion and stenosis of bilateral carotid arteries: Secondary | ICD-10-CM | POA: Diagnosis not present

## 2023-04-29 DIAGNOSIS — N183 Chronic kidney disease, stage 3 unspecified: Secondary | ICD-10-CM | POA: Insufficient documentation

## 2023-04-29 DIAGNOSIS — F17210 Nicotine dependence, cigarettes, uncomplicated: Secondary | ICD-10-CM | POA: Insufficient documentation

## 2023-04-29 DIAGNOSIS — I70211 Atherosclerosis of native arteries of extremities with intermittent claudication, right leg: Secondary | ICD-10-CM | POA: Diagnosis not present

## 2023-04-29 DIAGNOSIS — Z794 Long term (current) use of insulin: Secondary | ICD-10-CM | POA: Diagnosis not present

## 2023-04-29 DIAGNOSIS — Z79899 Other long term (current) drug therapy: Secondary | ICD-10-CM | POA: Insufficient documentation

## 2023-04-29 DIAGNOSIS — E785 Hyperlipidemia, unspecified: Secondary | ICD-10-CM | POA: Diagnosis not present

## 2023-04-29 DIAGNOSIS — K219 Gastro-esophageal reflux disease without esophagitis: Secondary | ICD-10-CM | POA: Diagnosis not present

## 2023-04-29 DIAGNOSIS — I129 Hypertensive chronic kidney disease with stage 1 through stage 4 chronic kidney disease, or unspecified chronic kidney disease: Secondary | ICD-10-CM | POA: Diagnosis not present

## 2023-04-29 HISTORY — PX: ABDOMINAL AORTOGRAM W/LOWER EXTREMITY: CATH118223

## 2023-04-29 LAB — GLUCOSE, CAPILLARY
Glucose-Capillary: 195 mg/dL — ABNORMAL HIGH (ref 70–99)
Glucose-Capillary: 208 mg/dL — ABNORMAL HIGH (ref 70–99)

## 2023-04-29 SURGERY — ABDOMINAL AORTOGRAM W/LOWER EXTREMITY
Anesthesia: LOCAL

## 2023-04-29 MED ORDER — OXYCODONE HCL 5 MG PO TABS: 5.0000 mg | ORAL_TABLET | Freq: Once | ORAL | Status: AC

## 2023-04-29 MED ORDER — SODIUM CHLORIDE 0.9 % IV SOLN: 250.0000 mL | INTRAVENOUS | Status: DC | PRN

## 2023-04-29 MED ORDER — SODIUM CHLORIDE 0.9% FLUSH: 3.0000 mL | Freq: Two times a day (BID) | INTRAVENOUS | Status: DC

## 2023-04-29 MED ORDER — SODIUM CHLORIDE 0.9 % WEIGHT BASED INFUSION
1.0000 mL/kg/h | INTRAVENOUS | Status: DC
  Administered 2023-04-29: 1 mL/kg/h via INTRAVENOUS

## 2023-04-29 MED ORDER — ASPIRIN 81 MG PO CHEW: 81.0000 mg | CHEWABLE_TABLET | ORAL | Status: DC

## 2023-04-29 MED ORDER — OXYCODONE-ACETAMINOPHEN 5-325 MG PO TABS: 1.0000 | ORAL_TABLET | Freq: Once | ORAL | Status: AC

## 2023-04-29 MED ORDER — IODIXANOL 320 MG/ML IV SOLN
INTRAVENOUS | Status: DC | PRN
  Administered 2023-04-29: 10 mL via INTRA_ARTERIAL

## 2023-04-29 MED ORDER — LIDOCAINE HCL (PF) 1 % IJ SOLN
INTRAMUSCULAR | Status: DC | PRN
  Administered 2023-04-29: 15 mL

## 2023-04-29 MED ORDER — OXYCODONE HCL 5 MG PO TABS
ORAL_TABLET | ORAL | Status: AC
  Administered 2023-04-29: 5 mg via ORAL
  Filled 2023-04-29: qty 1

## 2023-04-29 MED ORDER — FENTANYL CITRATE (PF) 100 MCG/2ML IJ SOLN
INTRAMUSCULAR | Status: AC
  Filled 2023-04-29: qty 2

## 2023-04-29 MED ORDER — MIDAZOLAM HCL 2 MG/2ML IJ SOLN
INTRAMUSCULAR | Status: AC
  Filled 2023-04-29: qty 2

## 2023-04-29 MED ORDER — SODIUM CHLORIDE 0.9 % IV SOLN: INTRAVENOUS | Status: DC

## 2023-04-29 MED ORDER — ACETAMINOPHEN 325 MG PO TABS: 650.0000 mg | ORAL_TABLET | ORAL | Status: DC | PRN

## 2023-04-29 MED ORDER — HEPARIN (PORCINE) IN NACL 1000-0.9 UT/500ML-% IV SOLN
INTRAVENOUS | Status: DC | PRN
  Administered 2023-04-29 (×2): 500 mL

## 2023-04-29 MED ORDER — SODIUM CHLORIDE 0.9% FLUSH: 3.0000 mL | INTRAVENOUS | Status: DC | PRN

## 2023-04-29 MED ORDER — OXYCODONE-ACETAMINOPHEN 5-325 MG PO TABS
ORAL_TABLET | ORAL | Status: AC
  Administered 2023-04-29: 1 via ORAL
  Filled 2023-04-29: qty 1

## 2023-04-29 MED ORDER — LIDOCAINE HCL (PF) 1 % IJ SOLN
INTRAMUSCULAR | Status: AC
  Filled 2023-04-29: qty 30

## 2023-04-29 MED ORDER — FENTANYL CITRATE (PF) 100 MCG/2ML IJ SOLN
INTRAMUSCULAR | Status: DC | PRN
  Administered 2023-04-29: 50 ug via INTRAVENOUS

## 2023-04-29 MED ORDER — ONDANSETRON HCL 4 MG/2ML IJ SOLN: 4.0000 mg | Freq: Four times a day (QID) | INTRAMUSCULAR | Status: DC | PRN

## 2023-04-29 MED ORDER — MIDAZOLAM HCL 2 MG/2ML IJ SOLN
INTRAMUSCULAR | Status: DC | PRN
  Administered 2023-04-29: 1 mg via INTRAVENOUS

## 2023-04-29 MED ORDER — SODIUM CHLORIDE 0.9 % WEIGHT BASED INFUSION
3.0000 mL/kg/h | INTRAVENOUS | Status: AC
  Administered 2023-04-29: 3 mL/kg/h via INTRAVENOUS

## 2023-04-29 SURGICAL SUPPLY — 14 items
CATH ANGIO 5F PIGTAIL 65CM (CATHETERS) IMPLANT
CATH CROSS OVER TEMPO 5F (CATHETERS) IMPLANT
CATH STRAIGHT 5FR 65CM (CATHETERS) IMPLANT
DEVICE CLOSURE MYNXGRIP 5F (Vascular Products) IMPLANT
KIT ANGIASSIST CO2 SYSTEM (KITS) IMPLANT
KIT MICROPUNCTURE NIT STIFF (SHEATH) IMPLANT
KIT PV (KITS) ×1 IMPLANT
SHEATH PINNACLE 5F 10CM (SHEATH) IMPLANT
SHEATH PROBE COVER 6X72 (BAG) IMPLANT
STOPCOCK MORSE 400PSI 3WAY (MISCELLANEOUS) IMPLANT
TRANSDUCER W/STOPCOCK (MISCELLANEOUS) ×1 IMPLANT
TRAY PV CATH (CUSTOM PROCEDURE TRAY) ×1 IMPLANT
TUBING CIL FLEX 10 FLL-RA (TUBING) IMPLANT
WIRE HITORQ VERSACORE ST 145CM (WIRE) IMPLANT

## 2023-04-29 NOTE — Interval H&P Note (Signed)
History and Physical Interval Note:  04/29/2023 11:40 AM  Alejandra Hernandez  has presented today for surgery, with the diagnosis of pad.  The various methods of treatment have been discussed with the patient and family. After consideration of risks, benefits and other options for treatment, the patient has consented to  Procedure(s): ABDOMINAL AORTOGRAM W/LOWER EXTREMITY (N/A) as a surgical intervention.  The patient's history has been reviewed, patient examined, no change in status, stable for surgery.  I have reviewed the patient's chart and labs.  Questions were answered to the patient's satisfaction.     Lorine Bears

## 2023-04-29 NOTE — Discharge Instructions (Signed)
Femoral Site Care This sheet gives you information about how to care for yourself after your procedure. Your health care provider may also give you more specific instructions. If you have problems or questions, contact your health care provider. What can I expect after the procedure?  After the procedure, it is common to have: Bruising that usually fades within 1-2 weeks. Tenderness at the site. Follow these instructions at home: Wound care Follow instructions from your health care provider about how to take care of your insertion site. Make sure you: Wash your hands with soap and water before you change your bandage (dressing). If soap and water are not available, use hand sanitizer. Remove your dressing 6/6/62024 at 12 noon. Do not take baths, swim, or use a hot tub until your health care provider approves. You may shower after you remove the dressing Pat the area dry with a clean towel. Do not rub the site. This may cause bleeding. Do not apply powder or lotion to the site. Keep the site clean and dry. Check your femoral site every day for signs of infection. Check for: Redness, swelling, or pain. Fluid or blood. Warmth. Pus or a bad smell. Activity For the first 2-3 days after your procedure, or as long as directed: Avoid climbing stairs as much as possible. Do not squat. Do not lift anything that is heavier than 10 lb (4.5 kg), or the limit that you are told, until your health care provider says that it is safe. For 5 days Rest as directed. Avoid sitting for a long time without moving. Get up to take short walks every 1-2 hours. Do not drive for 24 hours if you were given a medicine to help you relax (sedative). General instructions Take over-the-counter and prescription medicines only as told by your health care provider. Keep all follow-up visits as told by your health care provider. This is important. Contact a health care provider if you have: A fever or chills. You have  redness, swelling, or pain around your insertion site. Get help right away if: The catheter insertion area swells very fast. Apply steady pressure for 15 minutes and call 911  You pass out. You suddenly start to sweat or your skin gets clammy. The catheter insertion area is bleeding, and the bleeding does not stop when you hold steady pressure on the area for 15 minutes. The area near or just beyond the catheter insertion site becomes pale, cool, tingly, or numb. These symptoms may represent a serious problem that is an emergency. Do not wait to see if the symptoms will go away. Get medical help right away. Call your local emergency services (911 in the [place] Do not drive yourself to the hospital. Summary After the procedure, it is common to have bruising that usually fades within 1-2 weeks. Check your femoral site every day for signs of infection. Do not lift anything that is heavier than 10 lb (4.5 kg), or the limit that you are told, until your health care provider says that it is safe. This information is not intended to replace advice given to you by your health care provider. Make sure you discuss any questions you have with your health care provider. Document Revised: 11/23/2017 Document Reviewed: 11/23/2017 Elsevier Patient Education  2020 [employer].

## 2023-04-30 ENCOUNTER — Encounter (HOSPITAL_COMMUNITY): Payer: Self-pay | Admitting: Cardiovascular Disease

## 2023-05-11 ENCOUNTER — Ambulatory Visit (INDEPENDENT_AMBULATORY_CARE_PROVIDER_SITE_OTHER): Payer: 59 | Admitting: Podiatry

## 2023-05-11 DIAGNOSIS — B351 Tinea unguium: Secondary | ICD-10-CM

## 2023-05-11 DIAGNOSIS — M79675 Pain in left toe(s): Secondary | ICD-10-CM | POA: Diagnosis not present

## 2023-05-11 DIAGNOSIS — M79674 Pain in right toe(s): Secondary | ICD-10-CM

## 2023-05-11 NOTE — Progress Notes (Signed)
          Subjective:  Patient ID: Alejandra Hernandez, female    DOB: September 12, 1956,  MRN: [id number]   Alejandra Hernandez presents to clinic today for:  Chief Complaint  Patient presents with   Nail Problem     Routine foot care  . Patient notes nails are thick, discolored, elongated and painful in shoegear when trying to ambulate.    PCP is Roseanne Reno, Sami, MD.  Allergies  Allergen Reactions   Lisinopril Hives   Losartan Other (See Comments)    Pt reports causes GI issues   Sodium Bicarbonate Other (See Comments)    Pt reports causes GI issues    Review of Systems: Negative except as noted in the HPI.  Objective:  Alejandra Hernandez is a pleasant 67 y.o. female in NAD. AAO x 3.  Vascular Examination: Capillary refill time is 3-5 seconds to toes bilateral. Palpable pedal pulses b/l LE. Digital hair present b/l. No pedal edema b/l. Skin temperature gradient WNL b/l. No varicosities b/l. No cyanosis or clubbing noted b/l.   Dermatological Examination: Pedal skin with normal turgor, texture and tone b/l. No open wounds. No interdigital macerations b/l. Toenails x10 are 3mm thick, discolored, dystrophic with subungual debris. There is pain with compression of the nail plates.  They are elongated x10  Assessment/Plan: 1. Pain due to onychomycosis of toenails of both feet     The mycotic toenails were sharply debrided x10 with sterile nail nippers and a power debriding burr to decrease bulk/thickness and length.    Return in about 3 months (around 08/11/2023) for [facility].   Clerance Lav, DPM, FACFAS [facility]     [address] [place], [place] [address]                Office [phone]  Fax [phone]

## 2023-05-26 ENCOUNTER — Other Ambulatory Visit: Payer: Self-pay | Admitting: Internal Medicine

## 2023-05-26 ENCOUNTER — Ambulatory Visit: Payer: 59 | Attending: Physician Assistant | Admitting: Physician Assistant

## 2023-05-26 ENCOUNTER — Encounter: Payer: Self-pay | Admitting: Physician Assistant

## 2023-05-26 VITALS — BP 154/80 | HR 84 | Ht 63.0 in | Wt 143.8 lb

## 2023-05-26 DIAGNOSIS — I2510 Atherosclerotic heart disease of native coronary artery without angina pectoris: Secondary | ICD-10-CM

## 2023-05-26 DIAGNOSIS — I739 Peripheral vascular disease, unspecified: Secondary | ICD-10-CM | POA: Diagnosis not present

## 2023-05-26 DIAGNOSIS — Z86718 Personal history of other venous thrombosis and embolism: Secondary | ICD-10-CM

## 2023-05-26 DIAGNOSIS — I10 Essential (primary) hypertension: Secondary | ICD-10-CM

## 2023-05-26 DIAGNOSIS — R600 Localized edema: Secondary | ICD-10-CM

## 2023-05-26 DIAGNOSIS — E785 Hyperlipidemia, unspecified: Secondary | ICD-10-CM

## 2023-05-26 DIAGNOSIS — Z1231 Encounter for screening mammogram for malignant neoplasm of breast: Secondary | ICD-10-CM

## 2023-05-26 MED ORDER — AMLODIPINE BESYLATE 5 MG PO TABS: 5.0000 mg | ORAL_TABLET | Freq: Every day | ORAL | 3 refills | Status: DC

## 2023-05-26 MED ORDER — CARVEDILOL 6.25 MG PO TABS: 6.2500 mg | ORAL_TABLET | Freq: Two times a day (BID) | ORAL | 3 refills | Status: DC

## 2023-05-26 NOTE — Progress Notes (Unsigned)
Cardiology Office Note:  .   Date:  05/28/2023  ID:  [name], DOB 1956-04-25, MRN [id number] PCP: Quitman Livings, MD  [facility] Providers Cardiologist:  Armanda Magic, MD PV Cardiologist:  Lorine Bears, MD     History of Present Illness: .   Alejandra Hernandez is a 67 y.o. 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  She had left SFA directional atherectomy and drug-coated balloon angioplasty in 2017.  Drug-coated balloon angioplasty of the mid to distal right SFA and stenting of right common iliac artery in 2018.  Cardiac catheterization in June 2019 showed patent RCA stent, patent left circumflex artery, there was a lesion in the AV groove circumflex that was too small to intervene on.  Medical therapy was recommended.  Imdur was further uptitrated.  Carotid duplex in July 2019 showed 40 to 59% right ICA disease, greater than 50% right external carotid artery disease, 1 to 39% left ICA disease, greater than 50% left external carotid artery disease, stenotic right subclavian artery.  He underwent lower extremity angiography by Dr. Kirke Corin in November 2019 and was placed on aspirin and Xarelto only.  He was not placed on Plavix due to history of GI bleed.  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  Myoview in June 2020 was low risk, EF 69%, no ischemia.  Heart monitor showed 4 beat run of wide-complex tachycardia with transient heart block and a sinus pause in the setting  of nausea and vomiting.  She was later diagnosed with left upper extremity DVT requiring embolectomy.  Last cardiac catheterization performed in September 2022 showed patent mid RCA, 30% mid left circumflex lesion, 60% distal left circumflex stenosis with EF 55 to 60%, unchanged when compared to the previous cardiac catheterization in 2019.  Chest pain was felt to be noncardiac at the time.  She was last seen by Dr. Mayford Knife in November 2023.  More recently, patient was seen by Dr. Kirke Corin at which time she complained of worsening right calf claudication with minimal activity and short distance walking.  ABI showed a drop of ABI on the right to 0.68 and progression of the right common femoral artery stenosis with peak velocity of 352 with extension of plaque into the ostial profunda and proximal right SFA.  She underwent lower extremity angiography by Dr. Kirke Corin on 04/29/2023 which revealed patent common iliac artery stent with mild in-stent restenosis, mild common femoral artery stenosis, moderate diffuse SFA disease and moderate proximal popliteal disease with two-vessel runoff below the knee, no significant iliac disease on the side.  There was no critical stenosis to explain her claudication symptoms, therefore medical therapy was recommended.  Patient presents today for postprocedural follow-up.  She denies any significant pain or hematoma at the cath site.  She continues to have pain in bilateral lower extremity.  Her lower extremity appears to be swollen on physical exam.  I recommended reduce her amlodipine down to 5 mg daily.  Her blood pressure is elevated today, she has been  out of carvedilol for the past month.  I will refill her carvedilol 6.25 mg twice a day.  Her leg pain is not due to a vascular issue.  She can repeat a vascular ultrasound in 1 year prior to follow-up with Dr. Kirke Corin.  She should follow-up with Dr. Mayford Knife in November of this year.  She has follow-up with nephrology service next week we  will obtain basic metabolic panel.   ROS:   She denies chest pain, palpitations, dyspnea, pnd, orthopnea, n, v, dizziness, syncope, edema, weight gain, or early satiety. All other systems reviewed and are otherwise negative except as noted above.    Studies Reviewed: .        Cardiac Studies & Procedures   CARDIAC CATHETERIZATION  CARDIAC CATHETERIZATION 08/02/2021  Narrative   Mid Cx lesion is 30% stenosed.   Dist Cx lesion is 60% stenosed.   Non-stenotic Mid RCA lesion was previously treated.   The left ventricular systolic function is normal.   LV end diastolic pressure is normal.   The left ventricular ejection fraction is 55-65% by visual estimate.  Nonobstructive CAD Normal LV function Normal LVEDP  Plan: there is no change from 2019 to explain her chest pain. Continue medical therapy.  Findings Coronary Findings Diagnostic  Dominance: Right  Left Main Vessel was injected. Vessel is normal in caliber. Vessel is angiographically normal.  Left Anterior Descending Vessel was injected. Vessel is normal in caliber. Vessel is angiographically normal.  Left Circumflex Mid Cx lesion is 30% stenosed. Dist Cx lesion is 60% stenosed. Small vessel  Right Coronary Artery Non-stenotic Mid RCA lesion was previously treated.  Intervention  No interventions have been documented.   CARDIAC CATHETERIZATION  CARDIAC CATHETERIZATION 05/17/2018  Narrative  Previously placed Mid RCA stent (unknown type) is widely patent.  Dist Cx lesion is 75% stenosed.  Mid Cx lesion is 30% stenosed.  Alejandra Hernandez is a 67 y.o. female   [id number] LOCATION:  FACILITY: [facility] PHYSICIAN: Nanetta Batty, M.D. Oct 03, 1956   DATE OF PROCEDURE:  05/17/2018  DATE OF DISCHARGE:     CARDIAC CATHETERIZATION    History obtained from chart review.67 y.o. female with a hx of CAD s/p prior RCA stent, NSTEMI 06/01/17 (patent stent, but 100% midCx not amenable to PCI), myoview 11/2017  negative for reversible ischemia, presents with unstable angina, symptoms controlled with NTG, normal enzymes and low risk ECG.  Impression Ms. Freedland has no significant CAD.  Her RCA stent is widely patent.  Her circumflex actually is patent whereas in July of last year at the time of cath performed by Dr. [place] it was occluded in the midportion.  She does have a lesion and a small continuation of the AV groove circumflex and a vessel too small to intervene on.  I recommend medical therapy.  Nanetta Batty. MD, [facility] 05/17/2018 10:58 AM  Findings Coronary Findings Diagnostic  Dominance: Right  Left Circumflex Mid Cx lesion is 30% stenosed. Dist Cx lesion is 75% stenosed. Small vessel  Right Coronary Artery Previously placed Mid RCA stent (unknown type) is widely patent.  Intervention  No interventions have been documented.   STRESS TESTS  MYOCARDIAL PERFUSION IMAGING 04/18/2021  Narrative  The left ventricular ejection fraction is normal (55-65%).  Nuclear stress EF: 57%.  There was no ST segment deviation noted during stress.  The study is normal.  This is a low risk study.  Normal stress nuclear study with no ischemia or infarction.  Gated ejection fraction 57% with  normal wall motion.   ECHOCARDIOGRAM  ECHOCARDIOGRAM COMPLETE 08/02/2021  Narrative ECHOCARDIOGRAM REPORT    Patient Name:   SONITA FOOS Date of Exam: 08/02/2021 Medical Rec #:  [id number]      Height:       63.0 in Accession #:    [id number]     Weight:       149.7 lb Date of Birth:  1956/09/19     BSA:          1.710 m Patient Age:    64 years       BP:           134/78 mmHg Patient Gender: F              HR:           77 bpm. Exam Location:  Inpatient  Procedure: 2D Echo, Cardiac Doppler and Color Doppler  Indications:    CAD  History:        Patient has prior history of Echocardiogram examinations, most recent 04/18/2021. Risk Factors:Hypertension and Dyslipidemia.  Sonographer:     Andee Lineman Referring Phys: [id number] [place] TANNENBAUM  IMPRESSIONS   1. Left ventricular ejection fraction, by estimation, is 65 to 70%. The left ventricle has normal function. The left ventricle has no regional wall motion abnormalities. There is moderate asymmetric left ventricular hypertrophy of the basal-septal segment. Left ventricular diastolic parameters are indeterminate. 2. Right ventricular systolic function is normal. The right ventricular size is normal. Tricuspid regurgitation signal is inadequate for assessing PA pressure. 3. The mitral valve is normal in structure. Trivial mitral valve regurgitation. 4. The aortic valve is tricuspid. Aortic valve regurgitation is not visualized. Mild aortic valve sclerosis is present, with no evidence of aortic valve stenosis. 5. The inferior vena cava is normal in size with greater than 50% respiratory variability, suggesting right atrial pressure of 3 mmHg.  FINDINGS Left Ventricle: Left ventricular ejection fraction, by estimation, is 65 to 70%. The left ventricle has normal function. The left ventricle has no regional wall motion abnormalities. The left ventricular internal cavity size was normal in size. There is moderate asymmetric left ventricular hypertrophy of the basal-septal segment. Left ventricular diastolic parameters are indeterminate.  Right Ventricle: The right ventricular size is normal. No increase in right ventricular wall thickness. Right ventricular systolic function is normal. Tricuspid regurgitation signal is inadequate for assessing PA pressure.  Left Atrium: Left atrial size was normal in size.  Right Atrium: Right atrial size was normal in size.  Pericardium: Trivial pericardial effusion is present.  Mitral Valve: The mitral valve is normal in structure. Trivial mitral valve regurgitation.  Tricuspid Valve: The tricuspid valve is normal in structure. Tricuspid valve regurgitation is trivial.  Aortic Valve: The  aortic valve is tricuspid. Aortic valve regurgitation is not visualized. Mild aortic valve sclerosis is present, with no evidence of aortic valve stenosis. Aortic valve mean gradient measures 6.0 mmHg. Aortic valve peak gradient measures 8.5 mmHg. Aortic valve area, by VTI measures 1.93 cm.  Pulmonic Valve: The pulmonic valve was not well visualized. Pulmonic valve regurgitation is not visualized.  Aorta: The aortic root and ascending aorta are structurally normal, with no evidence of dilitation.  Venous: The inferior vena cava is normal in size with greater than 50% respiratory variability, suggesting right atrial pressure of 3 mmHg.  IAS/Shunts: No atrial level shunt detected by color flow Doppler.   LEFT VENTRICLE PLAX 2D LVIDd:         4.20  cm     Diastology LVIDs:         2.70 cm     LV e' medial:    4.57 cm/s LV PW:         0.90 cm     LV E/e' medial:  14.9 LV IVS:        1.60 cm     LV e' lateral:   4.90 cm/s LVOT diam:     1.70 cm     LV E/e' lateral: 13.9 LV SV:         65 LV SV Index:   38 LVOT Area:     2.27 cm  LV Volumes (MOD) LV vol d, MOD A4C: 69.4 ml LV vol s, MOD A4C: 17.8 ml LV SV MOD A4C:     69.4 ml  RIGHT VENTRICLE             IVC RV S prime:     15.70 cm/s  IVC diam: 1.90 cm TAPSE (M-mode): 2.3 cm  LEFT ATRIUM           Index       RIGHT ATRIUM          Index LA diam:      1.50 cm 0.88 cm/m  RA Area:     9.94 cm LA Vol (A4C): 43.6 ml 25.50 ml/m RA Volume:   17.50 ml 10.24 ml/m AORTIC VALVE                    PULMONIC VALVE AV Area (Vmax):    1.85 cm     PV Vmax:       1.08 m/s AV Area (Vmean):   1.75 cm     PV Peak grad:  4.7 mmHg AV Area (VTI):     1.93 cm AV Vmax:           146.00 cm/s AV Vmean:          119.000 cm/s AV VTI:            0.336 m AV Peak Grad:      8.5 mmHg AV Mean Grad:      6.0 mmHg LVOT Vmax:         119.00 cm/s LVOT Vmean:        91.500 cm/s LVOT VTI:          0.286 m LVOT/AV VTI ratio: 0.85  AORTA Ao Root diam: 3.40  cm Ao Asc diam:  3.40 cm  MITRAL VALVE MV Area (PHT): 2.42 cm     SHUNTS MV E velocity: 67.90 cm/s   Systemic VTI:  0.29 m MV A velocity: 110.00 cm/s  Systemic Diam: 1.70 cm MV E/A ratio:  0.62  Epifanio Lesches MD Electronically signed by Epifanio Lesches MD Signature Date/Time: 08/02/2021/3:13:41 PM    Final    MONITORS  CARDIAC EVENT MONITOR 08/24/2019   CT SCANS  CT CORONARY MORPH W/CTA COR W/SCORE 03/05/2017  Addendum 03/05/2017  4:53 PM ADDENDUM REPORT: 03/05/2017 16:51  EXAM: OVER-READ INTERPRETATION  CT CHEST  The following report is an over-read performed by radiologist Dr. Royal P[facility] on 03/05/2017. This over-read does not include interpretation of cardiac or coronary anatomy or pathology. The coronary calcium score and cardiac CTA interpretation by the cardiologist is attached.  COMPARISON:  PET-CT 02/02/2017.  Chest CT 01/02/2017.  FINDINGS: In the anterior aspect of the right upper lobe there continues to be a mass-like area of architectural distortion, however,  when compared to prior chest CT 01/02/2017 and prior PET-CT 02/02/2017 this area continues to undergo involution, presumably an area of evolving post infectious scarring. No new acute consolidative airspace disease. No pleural effusions. No lymphadenopathy noted in the visualized portions of the thorax. Aortic atherosclerosis. Visualized portions of the upper abdomen demonstrate heterogeneous areas of low attenuation throughout the hepatic parenchyma, indicative of underlying hepatic steatosis. There are no aggressive appearing lytic or blastic lesions noted in the visualized portions of the skeleton.  IMPRESSION: 1. The appearance of the right upper lobe is again most suggestive of evolving post infectious scarring. 2. Aortic atherosclerosis. 3. Heterogeneous hepatic steatosis.   Electronically Signed By: Trudie Reed M.D. On: 03/05/2017  16:51  Narrative CLINICAL DATA:  Chest pain  EXAM: Cardiac CTA  MEDICATIONS: Sub lingual nitro. 4mg  and lopressor 15 mg  TECHNIQUE: The patient was scanned on a Siemens 128 slice scanner. Gantry rotation speed was 280 msecs. Collimation was.6 mm . A 120 kV prospective scan was triggered in the ascending thoracic aorta at 120 HU  A prospective adaptive scan was down with full mA at 60-80% of the R to R interval. Average HR during the scan was 78 bpm. The 3D data set was interpreted on a dedicated work station using MPR, MIP and VRT modes. A total of 80cc of contrast was used. Patient had to have IR radiology start 4Fr right brachial sheath access  FINDINGS: Non-cardiac: See separate report from [facility]. No significant findings on limited lung and soft tissue windows.  Calcium Score:  66 calcium noted in proximal LAD and circumflex  Coronary Arteries: Right dominant with no anomalies  LM:  Normal  LAD:  Less than 30% calcified disease proximally  D1:  Not well seen  D2:  Not well seen  Circumflex: Less than 30% calcified disease proximally and mid vessel  OM1:  Not well seen  OM2:  Note well seen  RCA:  Not well seen do do motion artifact  PDA:  Not seen  PLA:  Not seen  IMPRESSION: 1) Calcium score 66 with calcium in proximal LAD and proximal circumflex 89th percentile for age and sex  2) Non obstructive LAD disease. Non diagnostic study as RCA not well seen due to motion artifact and distal circumflex not well seen  Patient with PVC;s and high HR despite 15 mg of iv lopressor  3) Poor iv access patient required IR radiology start of 4 Fr brachial sheath  Charlton Haws  Electronically Signed: By: Charlton Haws M.D. On: 03/05/2017 16:02          Risk Assessment/Calculations:            Physical Exam:   VS:  BP (!) 154/80   Pulse 84   Ht 5\' 3"  (1.6 m)   Wt 143 lb 12.8 oz (65.2 kg)   LMP  (LMP Unknown)   SpO2 96%   BMI 25.47  kg/m    Wt Readings from Last 3 Encounters:  05/26/23 143 lb 12.8 oz (65.2 kg)  04/29/23 140 lb (63.5 kg)  04/21/23 140 lb 3.2 oz (63.6 kg)    GEN: Well nourished, well developed in no acute distress NECK: No JVD; No carotid bruits CARDIAC: RRR, no murmurs, rubs, gallops RESPIRATORY:  Clear to auscultation without rales, wheezing or rhonchi  ABDOMEN: Soft, non-tender, non-distended EXTREMITIES:  No edema; No deformity   ASSESSMENT AND PLAN: .    PAD: Recently underwent a lower extremity angiography on 04/29/2023, this revealed  patent common iliac artery stent with mild in-stent restenosis, moderate diffuse SFA disease, moderate proximal popliteal artery disease with two-vessel runoff below the knee, no significant iliac disease to explain her leg pain.  Continue medical therapy.  Repeat lower extremity ABI in 1 year prior to follow-up with Dr. Kirke Corin  CAD: Denies any chest pain  Hypertension: Blood pressure elevated, patient had ran out of carvedilol for some time, will refill carvedilol  History of DVT: On Xarelto  Hyperlipidemia: On Crestor and Repatha  Leg edema: Reduce amlodipine to 5 mg daily        Dispo: follow up with Dr. Mayford Knife in November and Dr. Kirke Corin you 1 year.  Signed, Azalee Course, PA

## 2023-05-26 NOTE — Patient Instructions (Signed)
Medication Instructions:  Carvedilol has been refilled  Amlodipine Dosage as been changed from 10 mg to 5 mg     If you need a refill on your cardiac medications before your next appointment, please call your pharmacy.   Lab work: Please have nephrologist to send a copy of lab work when completed  If you have labs (blood work) drawn today and your tests are completely normal, you will receive your results only by: MyChart Message (if you have MyChart) OR A paper copy in the mail If you have any lab test that is abnormal or we need to change your treatment, we will call you to review the results.  Testing/Procedures: In June/July of 2025 these test will be scheduled Your physician has requested that you have an ankle brachial index (ABI). During this test an ultrasound and blood pressure cuff are used to evaluate the arteries that supply the arms and legs with blood. Allow thirty minutes for this exam. There are no restrictions or special instructions. And Your physician has requested that you have a lower extremity arterial duplex. This test is an ultrasound of the arteries in the legs. It looks at arterial blood flow in the legs. Allow one hour for Lower Arterial scans. There are no restrictions or special instructions  And Also will be schedule for aortic/ivc/iliac duplex  Follow-Up: At [facility], you and your health needs are our priority.  As part of our continuing mission to provide you with exceptional heart care, we have created designated Provider Care Teams.  These Care Teams include your primary Cardiologist (physician) and Advanced Practice Providers (APPs -  Physician Assistants and Nurse Practitioners) who all work together to provide you with the care you need, when you need it. You will need a follow up appointment in  12   months with Dr. Kirke Corin after above test are completed.  Please call our office 2 months in advance to schedule this appointment.

## 2023-06-10 ENCOUNTER — Other Ambulatory Visit: Payer: Self-pay

## 2023-06-10 MED ORDER — ISOSORBIDE MONONITRATE ER 120 MG PO TB24: 120.0000 mg | ORAL_TABLET | Freq: Every day | ORAL | 3 refills | Status: DC

## 2023-06-24 ENCOUNTER — Ambulatory Visit: Admission: RE | Admit: 2023-06-24 | Payer: 59 | Source: Ambulatory Visit

## 2023-06-24 DIAGNOSIS — Z1231 Encounter for screening mammogram for malignant neoplasm of breast: Secondary | ICD-10-CM

## 2023-06-26 ENCOUNTER — Other Ambulatory Visit: Payer: Self-pay | Admitting: Internal Medicine

## 2023-06-26 DIAGNOSIS — R928 Other abnormal and inconclusive findings on diagnostic imaging of breast: Secondary | ICD-10-CM

## 2023-07-07 ENCOUNTER — Ambulatory Visit: Payer: Medicare Other | Admitting: Cardiovascular Disease

## 2023-07-09 ENCOUNTER — Ambulatory Visit
Admission: RE | Admit: 2023-07-09 | Discharge: 2023-07-09 | Disposition: A | Discharge status: Home / Self Care | Payer: 59 | Source: Ambulatory Visit | Attending: Internal Medicine | Admitting: Internal Medicine

## 2023-07-09 DIAGNOSIS — R928 Other abnormal and inconclusive findings on diagnostic imaging of breast: Secondary | ICD-10-CM

## 2023-08-12 ENCOUNTER — Ambulatory Visit: Payer: 59 | Admitting: Podiatry

## 2023-08-24 ENCOUNTER — Encounter: Payer: Self-pay | Admitting: Internal Medicine

## 2023-08-24 DIAGNOSIS — Z1231 Encounter for screening mammogram for malignant neoplasm of breast: Secondary | ICD-10-CM

## 2023-08-26 ENCOUNTER — Encounter: Payer: Self-pay | Admitting: Podiatry

## 2023-08-26 ENCOUNTER — Ambulatory Visit (INDEPENDENT_AMBULATORY_CARE_PROVIDER_SITE_OTHER): Payer: 59 | Admitting: Podiatry

## 2023-08-26 DIAGNOSIS — M79675 Pain in left toe(s): Secondary | ICD-10-CM

## 2023-08-26 DIAGNOSIS — I739 Peripheral vascular disease, unspecified: Secondary | ICD-10-CM

## 2023-08-26 DIAGNOSIS — E1159 Type 2 diabetes mellitus with other circulatory complications: Secondary | ICD-10-CM | POA: Diagnosis not present

## 2023-08-26 DIAGNOSIS — B351 Tinea unguium: Secondary | ICD-10-CM | POA: Diagnosis not present

## 2023-08-26 DIAGNOSIS — M79674 Pain in right toe(s): Secondary | ICD-10-CM | POA: Diagnosis not present

## 2023-08-26 NOTE — Progress Notes (Signed)
Subjective: Alejandra CAMPILLO is a 67 y.o. female patient with history of diabetes who presents to office today complaining of long,mildly painful nails  while ambulating in shoes; unable to trim. Last A1c was 7.4.   PCP:  Quitman Livings, MD   .  No other pedal complaints noted.  Patient Active Problem List   Diagnosis Date Noted   Chronic anticoagulation 08/03/2021   Nausea vomiting and diarrhea 05/19/2021   Dehydration 05/19/2021   Hyponatremia 05/18/2021   Hypomagnesemia 12/27/2020   Abnormal findings on diagnostic imaging of other specified body structures 08/27/2020   Disorder of mineral metabolism, unspecified 08/27/2020   Personal history of other venous thrombosis and embolism 08/27/2020   Pulmonary nodule 08/03/2020   Acidosis 07/06/2020   Complaints of total body pain 07/10/2019   Pain, unspecified 07/10/2019   Left upper extremity swelling 06/03/2019   COPD, 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   Leukocytosis 04/14/2019   Avascular necrosis of humeral head, left (HCC) 04/14/2019   Hypokalemia 04/14/2019   Neuropathy 04/14/2019   DKA (diabetic ketoacidosis) (HCC) 04/13/2019   Polyp of transverse colon    Atypical chest pain 11/16/2018   Diabetes mellitus, type II (HCC) 05/23/2018   CKD (chronic kidney disease), stage III (HCC) 05/14/2018   Unstable angina (HCC) 05/14/2018   Hyperglycemia 12/23/2017   Low TSH level 06/22/2017   CAD (coronary artery disease)    Nodule on liver 05/22/2017   Liver disease, unspecified 05/22/2017   Heart palpitations 05/14/2017   Unspecified skin changes 04/18/2017   Tobacco use 03/17/2017   Benign neoplasm of descending colon    Benign neoplasm of sigmoid colon    Chronic diastolic CHF (congestive heart failure) (HCC) 02/09/2017   History of DVT (deep vein thrombosis)    Abnormal CT scan, chest    Acute urinary retention    Claudication (HCC) 07/23/2016   PAD (peripheral artery disease) (HCC)    Essential hypertension 05/14/2016   Uncontrolled type  2 diabetes mellitus with ketoacidosis without coma, with long-term current use of insulin (HCC)    GERD (gastroesophageal reflux disease) 05/17/2014   Carotid artery disease (HCC) 05/17/2014   Iron deficiency anemia 04/26/2014   Dyspnea 04/25/2014   Carotid artery bruit 04/25/2014   Hyperlipidemia    Gout    Current Outpatient Medications on File Prior to Visit  Medication Sig Dispense Refill   acetaminophen (TYLENOL) 500 MG tablet Take 500-1,000 mg by mouth every 6 (six) hours as needed (pain.).     albuterol (PROAIR HFA) 108 (90 Base) MCG/ACT inhaler Inhale 2 puffs into the lungs every 4 (four) hours as needed for wheezing or shortness of breath. 1 Inhaler 5   amitriptyline (ELAVIL) 25 MG tablet Take 75 mg by mouth at bedtime.      amLODipine (NORVASC) 5 MG tablet Take 1 tablet (5 mg total) by mouth daily. 90 tablet 3   Blood Glucose Monitoring Suppl (ACCU-CHEK GUIDE ME) w/Device KIT 4 (four) times daily. for testing     Calcium Carbonate Antacid (TUMS PO) Take 2 tablets by mouth 3 (three) times daily as needed (acid indigestion).      carvedilol (COREG) 6.25 MG tablet Take 1 tablet (6.25 mg total) by mouth 2 (two) times daily with a meal. 180 tablet 3   cholecalciferol (VITAMIN D3) 25 MCG (1000 UNIT) tablet Take 1,000 Units by mouth in the morning.     cloNIDine (CATAPRES) 0.1 MG tablet Take 0.1 mg by mouth 2 (two) times daily.     Continuous Blood  Gluc Receiver (FREESTYLE LIBRE 14 DAY READER) DEVI 1 each by Other route as needed (blood glucose).     diclofenac Sodium (VOLTAREN) 1 % GEL Apply 1 application. topically daily as needed (pain). Apply to feet for pain as needed 150 g 2   Evolocumab (REPATHA SURECLICK) 140 MG/ML SOAJ INJECT 1 PEN INTO THE SKIN EVERY 14 DAYS 6 mL 3   ezetimibe (ZETIA) 10 MG tablet Take 1 tablet (10 mg total) by mouth daily. (Patient taking differently: Take 10 mg by mouth every evening.) 90 tablet 3   ferrous sulfate 325 (65 FE) MG tablet Take 650 mg by mouth in  the morning.     furosemide (LASIX) 20 MG tablet Take 20 mg by mouth 2 (two) times daily.     gabapentin (NEURONTIN) 600 MG tablet Take 0.5 tablets (300 mg total) by mouth 3 (three) times daily. 90 tablet 2   Glucagon 3 MG/DOSE POWD Place 1 spray into the nose as needed (low blood sugar).     glucose blood (ACCU-CHEK AVIVA PLUS) test strip 1 each by Other route 2 (two) times daily. And lancets 2/day 200 each 3   hydroxypropyl methylcellulose / hypromellose (ISOPTO TEARS / GONIOVISC) 2.5 % ophthalmic solution Place 1 drop into both eyes 3 (three) times daily as needed (dry/irritated eyes.).     insulin degludec (TRESIBA FLEXTOUCH) 200 UNIT/ML FlexTouch Pen Inject 14 Units into the skin in the morning.     insulin lispro (HUMALOG) 100 UNIT/ML injection Inject 1-10 Units into the skin 3 (three) times daily before meals. Per sliding scale     isosorbide mononitrate (IMDUR) 120 MG 24 hr tablet Take 1 tablet (120 mg total) by mouth daily. 90 tablet 3   lidocaine (LIDODERM) 5 % PLACE 1 PATCH ONTO SKIN DAILY, REMOVE AND DISCARD WITHIN 12 HOURS OR AS DIRECTED (Patient taking differently: Place 1 patch onto the skin daily as needed (pain.).) 90 patch 1   nitroGLYCERIN (NITROSTAT) 0.4 MG SL tablet PLACE 1 TABLET UNDER THE TONGUE EVERY 5 MINUTES AS NEEDED FOR CHEST PAIN 25 tablet 7   NURTEC 75 MG TBDP Take 75 mg by mouth daily as needed (migraine).     oxyCODONE-acetaminophen (PERCOCET) 10-325 MG tablet Take 1 tablet by mouth 4 (four) times daily as needed for pain.   0   pantoprazole (PROTONIX) 40 MG tablet TAKE 1 TABLET BY MOUTH TWICE DAILY BEFORE A MEAL 180 tablet 0   polyethylene glycol powder (MIRALAX) 17 GM/SCOOP powder Take 17 g by mouth daily as needed for mild constipation.     rosuvastatin (CRESTOR) 40 MG tablet Take 1 tablet (40 mg total) by mouth at bedtime. 90 tablet 3   Vitamin D, Ergocalciferol, (DRISDOL) 50000 units CAPS capsule Take 50,000 Units by mouth every Wednesday.   10   XARELTO 15 MG  TABS tablet Take 15 mg by mouth every evening.     No current facility-administered medications on file prior to visit.   Allergies  Allergen Reactions   Lisinopril Hives   Losartan Other (See Comments)    Pt reports causes GI issues   Sodium Bicarbonate Other (See Comments)    Pt reports causes GI issues      Objective: General: Patient is awake, alert, and oriented x 3 and in no acute distress.  Integument: Skin is warm, dry and supple bilateral. Nails are tender, long, thickened and dystrophic with subungual debris, consistent with onychomycosis, 1-5 bilateral. No signs of infection. Mild callus to bilateral hallux, Blister  noted to anterior leg.  remaining integument unremarkable.  Vasculature:  Dorsalis Pedis pulse 1/4 bilateral. Posterior Tibial pulse  1/4 bilateral. Capillary fill time <3 sec 1-5 bilateral. Positive hair growth to the level of the digits.Temperature gradient within normal limits.  1+ pitting edema bilateral ankles however easily displaced to be able to lightly palpate the pulses.  Pitting edema is worse on the left as compared to the right.  Neurology: The patient has diminished sensation measured with a 5.07/10g Semmes Weinstein Monofilament at all pedal sites bilateral. Vibratory sensation diminished bilateral with tuning fork. No Babinski sign present bilateral.   Musculoskeletal: Asymptomatic hallux limitus, pes planus, hammertoe pedal deformities noted bilateral. Muscular strength 4/5 in all lower extremity muscular groups bilateral without pain on range of motion.  Pain with walking calf that goes away with rest concerning for claudication.  No tenderness with calf compression bilateral.  No acute signs of DVT.  Assessment and Plan: Problem List Items Addressed This Visit       Cardiovascular and Mediastinum   PAD (peripheral artery disease) (HCC)     Endocrine   Diabetes mellitus, type II (HCC)   Other Visit Diagnoses     Pain due to onychomycosis  of toenails of both feet    -  Primary        -Examined patient. -Re-Discussed and educated patient on diabetic foot care  -Mechanically debrided all nails 1-5 bilateral using sterile nail nipper and filed with dremel without incident  -Blister on anterior leg drained and undelrying skin appeared healthy. Covered with neosporin and bandage advised on care.  -Patient to return  in 3 months for at risk foot nail care and callus care -Patient advised to call the office if any problems or questions arise in the meantime.  Louann Sjogren, DPM

## 2023-09-15 ENCOUNTER — Ambulatory Visit: Payer: 59 | Attending: Cardiovascular Disease | Admitting: Cardiovascular Disease

## 2023-09-15 ENCOUNTER — Encounter: Payer: Self-pay | Admitting: Cardiovascular Disease

## 2023-09-15 VITALS — BP 130/60 | HR 69 | Ht 63.0 in | Wt 134.0 lb

## 2023-09-15 DIAGNOSIS — R011 Cardiac murmur, unspecified: Secondary | ICD-10-CM | POA: Diagnosis not present

## 2023-09-15 DIAGNOSIS — Z720 Tobacco use: Secondary | ICD-10-CM

## 2023-09-15 DIAGNOSIS — I2510 Atherosclerotic heart disease of native coronary artery without angina pectoris: Secondary | ICD-10-CM

## 2023-09-15 DIAGNOSIS — E785 Hyperlipidemia, unspecified: Secondary | ICD-10-CM | POA: Diagnosis not present

## 2023-09-15 DIAGNOSIS — Z86718 Personal history of other venous thrombosis and embolism: Secondary | ICD-10-CM

## 2023-09-15 DIAGNOSIS — I6523 Occlusion and stenosis of bilateral carotid arteries: Secondary | ICD-10-CM | POA: Diagnosis not present

## 2023-09-15 DIAGNOSIS — I739 Peripheral vascular disease, unspecified: Secondary | ICD-10-CM | POA: Diagnosis not present

## 2023-09-15 DIAGNOSIS — I10 Essential (primary) hypertension: Secondary | ICD-10-CM

## 2023-09-15 MED ORDER — AMLODIPINE BESYLATE 2.5 MG PO TABS: 2.5000 mg | ORAL_TABLET | Freq: Every day | ORAL | 1 refills | Status: DC

## 2023-09-15 NOTE — Patient Instructions (Addendum)
Medication Instructions:  DECREASE the Amlodipine to 2.5 mg once daily  *If you need a refill on your cardiac medications before your next appointment, please call your pharmacy*   Lab Work: None ordered If you have labs (blood work) drawn today and your tests are completely normal, you will receive your results only by: MyChart Message (if you have MyChart) OR A paper copy in the mail If you have any lab test that is abnormal or we need to change your treatment, we will call you to review the results.   Testing/Procedures: Your physician has requested that you have an echocardiogram. Echocardiography is a painless test that uses sound waves to create images of your heart. It provides your doctor with information about the size and shape of your heart and how well your heart's chambers and valves are working. You may receive an ultrasound enhancing agent through an IV if needed to better visualize your heart during the echo.This procedure takes approximately one hour. There are no restrictions for this procedure. This will take place at the [address].   Your physician has requested that you have an ankle brachial index (ABI). During this test an ultrasound and blood pressure cuff are used to evaluate the arteries that supply the arms and legs with blood. Allow thirty minutes for this exam. There are no restrictions or special instructions. This will take place at [address] [facility], [address].      Follow-Up: At [facility], you and your health needs are our priority.  As part of our continuing mission to provide you with exceptional heart care, we have created designated Provider Care Teams.  These Care Teams include your primary Cardiologist (physician) and Advanced Practice Providers (APPs -  Physician Assistants and Nurse Practitioners) who all work together to provide you with the care you need, when you need it.  We recommend signing up for the patient  portal called "MyChart".  Sign up information is provided on this After Visit Summary.  MyChart is used to connect with patients for Virtual Visits (Telemedicine).  Patients are able to view lab/test results, encounter notes, upcoming appointments, etc.  Non-urgent messages can be sent to your provider as well.   To learn more about what you can do with MyChart, go to [email or web address].    Your next appointment:   1 month(s)  Provider:   Dr. Kirke Corin
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
## 2023-09-22 ENCOUNTER — Ambulatory Visit (HOSPITAL_COMMUNITY)
Admission: RE | Admit: 2023-09-22 | Discharge: 2023-09-22 | Disposition: A | Discharge status: Home / Self Care | Payer: 59 | Source: Ambulatory Visit | Attending: Cardiovascular Disease | Admitting: Cardiovascular Disease

## 2023-09-22 DIAGNOSIS — I739 Peripheral vascular disease, unspecified: Secondary | ICD-10-CM | POA: Insufficient documentation

## 2023-10-09 ENCOUNTER — Other Ambulatory Visit: Payer: Self-pay | Admitting: Cardiology

## 2023-10-12 ENCOUNTER — Other Ambulatory Visit: Payer: Self-pay | Admitting: Cardiology

## 2023-10-13 ENCOUNTER — Ambulatory Visit (HOSPITAL_COMMUNITY): Payer: 59 | Attending: Cardiovascular Disease

## 2023-10-13 DIAGNOSIS — R011 Cardiac murmur, unspecified: Secondary | ICD-10-CM | POA: Diagnosis present

## 2023-10-13 LAB — ECHOCARDIOGRAM COMPLETE
Area-P 1/2: 3.42 cm2
S' Lateral: 2.7 cm

## 2023-10-20 ENCOUNTER — Encounter: Payer: Self-pay | Admitting: Cardiovascular Disease

## 2023-10-20 ENCOUNTER — Ambulatory Visit: Payer: 59 | Attending: Cardiovascular Disease | Admitting: Cardiovascular Disease

## 2023-10-20 VITALS — BP 128/62 | HR 63 | Ht 69.0 in | Wt 134.8 lb

## 2023-10-20 DIAGNOSIS — E785 Hyperlipidemia, unspecified: Secondary | ICD-10-CM | POA: Diagnosis not present

## 2023-10-20 DIAGNOSIS — I739 Peripheral vascular disease, unspecified: Secondary | ICD-10-CM

## 2023-10-20 DIAGNOSIS — I6523 Occlusion and stenosis of bilateral carotid arteries: Secondary | ICD-10-CM

## 2023-10-20 DIAGNOSIS — I10 Essential (primary) hypertension: Secondary | ICD-10-CM

## 2023-10-20 DIAGNOSIS — Z86718 Personal history of other venous thrombosis and embolism: Secondary | ICD-10-CM

## 2023-10-20 DIAGNOSIS — I872 Venous insufficiency (chronic) (peripheral): Secondary | ICD-10-CM

## 2023-10-20 DIAGNOSIS — Z720 Tobacco use: Secondary | ICD-10-CM

## 2023-10-20 DIAGNOSIS — I2510 Atherosclerotic heart disease of native coronary artery without angina pectoris: Secondary | ICD-10-CM

## 2023-10-20 MED ORDER — XARELTO 15 MG PO TABS: 15.0000 mg | ORAL_TABLET | Freq: Every evening | ORAL | 1 refills | Status: DC
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
## 2023-10-20 NOTE — Patient Instructions (Signed)
Medication Instructions:  No changes *If you need a refill on your cardiac medications before your next appointment, please call your pharmacy*   Lab Work: None ordered If you have labs (blood work) drawn today and your tests are completely normal, you will receive your results only by: MyChart Message (if you have MyChart) OR A paper copy in the mail If you have any lab test that is abnormal or we need to change your treatment, we will call you to review the results.   Testing/Procedures: None ordered   Follow-Up: At [facility], you and your health needs are our priority.  As part of our continuing mission to provide you with exceptional heart care, we have created designated Provider Care Teams.  These Care Teams include your primary Cardiologist (physician) and Advanced Practice Providers (APPs -  Physician Assistants and Nurse Practitioners) who all work together to provide you with the care you need, when you need it.  We recommend signing up for the patient portal called "MyChart".  Sign up information is provided on this After Visit Summary.  MyChart is used to connect with patients for Virtual Visits (Telemedicine).  Patients are able to view lab/test results, encounter notes, upcoming appointments, etc.  Non-urgent messages can be sent to your provider as well.   To learn more about what you can do with MyChart, go to [email or web address].    Your next appointment:   6 month(s)  Provider:   Dr. Arida  

## 2023-12-02 ENCOUNTER — Ambulatory Visit (INDEPENDENT_AMBULATORY_CARE_PROVIDER_SITE_OTHER): Payer: 59 | Admitting: Podiatry

## 2023-12-02 ENCOUNTER — Encounter: Payer: Self-pay | Admitting: Podiatry

## 2023-12-02 DIAGNOSIS — E1159 Type 2 diabetes mellitus with other circulatory complications: Secondary | ICD-10-CM

## 2023-12-02 DIAGNOSIS — M79674 Pain in right toe(s): Secondary | ICD-10-CM

## 2023-12-02 DIAGNOSIS — I739 Peripheral vascular disease, unspecified: Secondary | ICD-10-CM

## 2023-12-02 DIAGNOSIS — M79675 Pain in left toe(s): Secondary | ICD-10-CM

## 2023-12-02 DIAGNOSIS — B351 Tinea unguium: Secondary | ICD-10-CM | POA: Diagnosis not present

## 2023-12-02 NOTE — Progress Notes (Signed)
 Subjective: Alejandra Hernandez is a 68 y.o. female patient with history of diabetes who presents to office today complaining of long,mildly painful nails  while ambulating in shoes; unable to trim. Last A1c was 7.4.   PCP:  Norval Kettle, MD   .  No other pedal complaints noted.  Patient Active Problem List   Diagnosis Date Noted   Chronic anticoagulation 08/03/2021   Nausea vomiting and diarrhea 05/19/2021   Dehydration 05/19/2021   Hyponatremia 05/18/2021   Hypomagnesemia 12/27/2020   Abnormal findings on diagnostic imaging of other specified body structures 08/27/2020   Disorder of mineral metabolism, unspecified 08/27/2020   Personal history of other venous thrombosis and embolism 08/27/2020   Pulmonary nodule 08/03/2020   Acidosis 07/06/2020   Complaints of total body pain 07/10/2019   Pain, unspecified 07/10/2019   Left upper extremity swelling 06/03/2019   COPD, 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   Leukocytosis 04/14/2019   Avascular necrosis of humeral head, left (HCC) 04/14/2019   Hypokalemia 04/14/2019   Neuropathy 04/14/2019   DKA (diabetic ketoacidosis) (HCC) 04/13/2019   Polyp of transverse colon    Atypical chest pain 11/16/2018   Diabetes mellitus, type II (HCC) 05/23/2018   CKD (chronic kidney disease), stage III (HCC) 05/14/2018   Unstable angina (HCC) 05/14/2018   Hyperglycemia 12/23/2017   Low TSH level 06/22/2017   CAD (coronary artery disease)    Nodule on liver 05/22/2017   Liver disease, unspecified 05/22/2017   Heart palpitations 05/14/2017   Unspecified skin changes 04/18/2017   Tobacco use 03/17/2017   Benign neoplasm of descending colon    Benign neoplasm of sigmoid colon    Chronic diastolic CHF (congestive heart failure) (HCC) 02/09/2017   History of DVT (deep vein thrombosis)    Abnormal CT scan, chest    Acute urinary retention    Claudication (HCC) 07/23/2016   PAD (peripheral artery disease) (HCC)    Essential hypertension 05/14/2016   Uncontrolled type  2 diabetes mellitus with ketoacidosis without coma, with long-term current use of insulin  (HCC)    GERD (gastroesophageal reflux disease) 05/17/2014   Carotid artery disease (HCC) 05/17/2014   Iron  deficiency anemia 04/26/2014   Dyspnea 04/25/2014   Carotid artery bruit 04/25/2014   Hyperlipidemia    Gout    Current Outpatient Medications on File Prior to Visit  Medication Sig Dispense Refill   acetaminophen  (TYLENOL ) 500 MG tablet Take 500-1,000 mg by mouth every 6 (six) hours as needed (pain.).     albuterol  (PROAIR  HFA) 108 (90 Base) MCG/ACT inhaler Inhale 2 puffs into the lungs every 4 (four) hours as needed for wheezing or shortness of breath. 1 Inhaler 5   amitriptyline  (ELAVIL ) 25 MG tablet Take 75 mg by mouth at bedtime.      amLODipine  (NORVASC ) 2.5 MG tablet Take 1 tablet (2.5 mg total) by mouth daily. 90 tablet 1   Blood Glucose Monitoring Suppl (ACCU-CHEK GUIDE ME) w/Device KIT 4 (four) times daily. for testing     Calcium  Carbonate Antacid (TUMS PO) Take 2 tablets by mouth 3 (three) times daily as needed (acid indigestion).      carvedilol  (COREG ) 6.25 MG tablet Take 1 tablet (6.25 mg total) by mouth 2 (two) times daily with a meal. 180 tablet 3   cholecalciferol (VITAMIN D3) 25 MCG (1000 UNIT) tablet Take 1,000 Units by mouth in the morning.     cloNIDine  (CATAPRES ) 0.1 MG tablet Take 0.1 mg by mouth 2 (two) times daily.     Continuous Blood  Gluc Receiver (FREESTYLE LIBRE 14 DAY READER) DEVI 1 each by Other route as needed (blood glucose).     diclofenac  Sodium (VOLTAREN ) 1 % GEL Apply 1 application. topically daily as needed (pain). Apply to feet for pain as needed 150 g 2   ezetimibe  (ZETIA ) 10 MG tablet Take 1 tablet (10 mg total) by mouth every evening. 90 tablet 3   ferrous sulfate  325 (65 FE) MG tablet Take 650 mg by mouth in the morning.     gabapentin  (NEURONTIN ) 600 MG tablet Take 0.5 tablets (300 mg total) by mouth 3 (three) times daily. 90 tablet 2   Glucagon 3  MG/DOSE POWD Place 1 spray into the nose as needed (low blood sugar).     glucose blood (ACCU-CHEK AVIVA PLUS) test strip 1 each by Other route 2 (two) times daily. And lancets 2/day 200 each 3   hydroxypropyl methylcellulose / hypromellose (ISOPTO TEARS / GONIOVISC) 2.5 % ophthalmic solution Place 1 drop into both eyes 3 (three) times daily as needed (dry/irritated eyes.).     insulin  degludec (TRESIBA  FLEXTOUCH) 200 UNIT/ML FlexTouch Pen Inject 14 Units into the skin in the morning.     insulin  lispro (HUMALOG ) 100 UNIT/ML injection Inject 1-10 Units into the skin 3 (three) times daily before meals. Per sliding scale     isosorbide  mononitrate (IMDUR ) 120 MG 24 hr tablet Take 1 tablet (120 mg total) by mouth daily. 90 tablet 3   lidocaine  (LIDODERM ) 5 % PLACE 1 PATCH ONTO SKIN DAILY, REMOVE AND DISCARD WITHIN 12 HOURS OR AS DIRECTED (Patient taking differently: Place 1 patch onto the skin daily as needed (pain.).) 90 patch 1   nitroGLYCERIN  (NITROSTAT ) 0.4 MG SL tablet PLACE 1 TABLET UNDER THE TONGUE EVERY 5 MINUTES AS NEEDED FOR CHEST PAIN 25 tablet 7   NURTEC 75 MG TBDP Take 75 mg by mouth daily as needed (migraine).     oxyCODONE -acetaminophen  (PERCOCET) 10-325 MG tablet Take 1 tablet by mouth 4 (four) times daily as needed for pain.   0   pantoprazole  (PROTONIX ) 40 MG tablet TAKE 1 TABLET BY MOUTH TWICE DAILY BEFORE A MEAL 180 tablet 0   polyethylene glycol powder (MIRALAX ) 17 GM/SCOOP powder Take 17 g by mouth daily as needed for mild constipation.     REPATHA  SURECLICK 140 MG/ML SOAJ INJECT 1 PEN INTO THE SKIN EVERY 14 DAYS 6 mL 3   rosuvastatin  (CRESTOR ) 40 MG tablet Take 1 tablet (40 mg total) by mouth at bedtime. 90 tablet 3   torsemide  (DEMADEX ) 20 MG tablet Take 20 mg by mouth 2 (two) times daily.     Vitamin D , Ergocalciferol , (DRISDOL ) 50000 units CAPS capsule Take 50,000 Units by mouth every Wednesday.   10   XARELTO  15 MG TABS tablet Take 1 tablet (15 mg total) by mouth every  evening. 90 tablet 1   No current facility-administered medications on file prior to visit.   Allergies  Allergen Reactions   Lisinopril  Hives   Losartan  Other (See Comments)    Pt reports causes GI issues   Sodium Bicarbonate Other (See Comments)    Pt reports causes GI issues      Objective: General: Patient is awake, alert, and oriented x 3 and in no acute distress.  Integument: Skin is warm, dry and supple bilateral. Nails are tender, long, thickened and dystrophic with subungual debris, consistent with onychomycosis, 1-5 bilateral. No signs of infection. Mild callus to bilateral hallux, Blister noted to anterior leg.  remaining  integument unremarkable.  Vasculature:  Dorsalis Pedis pulse 1/4 bilateral. Posterior Tibial pulse  1/4 bilateral. Capillary fill time <3 sec 1-5 bilateral. Positive hair growth to the level of the digits.Temperature gradient within normal limits.  1+ pitting edema bilateral ankles however easily displaced to be able to lightly palpate the pulses.  Pitting edema is worse on the left as compared to the right.  Neurology: The patient has diminished sensation measured with a 5.07/10g Semmes Weinstein Monofilament at all pedal sites bilateral. Vibratory sensation diminished bilateral with tuning fork. No Babinski sign present bilateral.   Musculoskeletal: Asymptomatic hallux limitus, pes planus, hammertoe pedal deformities noted bilateral. Muscular strength 4/5 in all lower extremity muscular groups bilateral without pain on range of motion.  Pain with walking calf that goes away with rest concerning for claudication.  No tenderness with calf compression bilateral.  No acute signs of DVT.  Assessment and Plan: Problem List Items Addressed This Visit       Cardiovascular and Mediastinum   PAD (peripheral artery disease) (HCC)     Endocrine   Diabetes mellitus, type II (HCC)   Other Visit Diagnoses       Pain due to onychomycosis of toenails of both feet     -  Primary        -Examined patient. -Re-Discussed and educated patient on diabetic foot care  -Mechanically debrided all nails 1-5 bilateral using sterile nail nipper and filed with dremel without incident  -Patient to return  in 3 months for at risk foot nail care and callus care -Patient advised to call the office if any problems or questions arise in the meantime.  Asberry Failing, DPM

## 2024-01-09 ENCOUNTER — Other Ambulatory Visit: Payer: Self-pay | Admitting: Cardiology

## 2024-02-01 ENCOUNTER — Encounter: Payer: Self-pay | Admitting: Hematology and Oncology

## 2024-02-18 ENCOUNTER — Encounter: Payer: Self-pay | Admitting: Hematology and Oncology

## 2024-02-25 ENCOUNTER — Encounter: Payer: Self-pay | Admitting: Hematology and Oncology

## 2024-03-02 ENCOUNTER — Ambulatory Visit (INDEPENDENT_AMBULATORY_CARE_PROVIDER_SITE_OTHER): Payer: 59 | Admitting: Podiatry

## 2024-03-02 ENCOUNTER — Other Ambulatory Visit (HOSPITAL_COMMUNITY): Payer: Self-pay | Admitting: *Deleted

## 2024-03-02 DIAGNOSIS — Z91199 Patient's noncompliance with other medical treatment and regimen due to unspecified reason: Secondary | ICD-10-CM

## 2024-03-02 NOTE — Progress Notes (Signed)
 No show

## 2024-03-03 ENCOUNTER — Inpatient Hospital Stay (HOSPITAL_COMMUNITY)
Admission: RE | Admit: 2024-03-03 | Discharge: 2024-03-03 | Disposition: A | Discharge status: Home / Self Care | Source: Ambulatory Visit | Attending: Nephrology | Admitting: Nephrology

## 2024-03-03 ENCOUNTER — Encounter (HOSPITAL_COMMUNITY): Payer: Self-pay

## 2024-03-17 ENCOUNTER — Encounter (HOSPITAL_COMMUNITY)

## 2024-03-30 ENCOUNTER — Telehealth: Payer: Self-pay | Admitting: Cardiology

## 2024-03-30 NOTE — Telephone Encounter (Signed)
 Pt c/o BP issue: STAT if pt c/o blurred vision, one-sided weakness or slurred speech.  STAT if BP is GREATER than 180/120 TODAY.  STAT if BP is LESS than 90/60 and SYMPTOMATIC TODAY  1. What is your BP concern? Low BP  2. Have you taken any BP medication today? no  3. What are your last 5 BP readings?88/30's, 90/47, 109/51 (all these reading were yesterday at the doctor's office, she has not checked her BP today or yesterday when she got home) 80/47 4/29 (patient states this day she felt dizzy like she was going to pass out)  4. Are you having any other symptoms (ex. Dizziness, headache, blurred vision, passed out)? Dizziness.

## 2024-03-30 NOTE — Telephone Encounter (Signed)
 Attempted to reach patient, but no answer and voicemail box has not been set up yet. Will re-attempt later.

## 2024-03-31 NOTE — Telephone Encounter (Signed)
 Patient identification verified by 2 forms. Alejandra Duck, RN     Called and spoke to patient  Patient states:  - experiencing low Bps, first noticed at her doctors visit.  - 88/30's, 90/47, 109/51 (all these reading were yesterday at the doctor's office, she has not checked her BP today or yesterday when she got home) 80/47 4/29 (patient states this day she felt dizzy like she was going to pass out) - BP today is 165/73  Patient denies:  - SOB, headache, vision changes, chest pain, one-sided muscle weakness.              Interventions/Plan: - Patient due for OV with Dr. Alvenia Aus but no appts available at this time. She will see APP in June.    Reviewed ED warning signs/precautions  Patient agrees with plan, no questions at this time

## 2024-04-16 ENCOUNTER — Other Ambulatory Visit: Payer: Self-pay | Admitting: Cardiovascular Disease

## 2024-04-19 NOTE — Telephone Encounter (Signed)
 Pt last saw Dr Alvenia Aus 10/20/23, last labs 07/20/23 Creat 2.01, age 68, weight 61.1kg, CrCl 26.2, based on CrCl pt is on appropriate dosage of Xarelto  15mg  every day for DVT.  Will refill rx

## 2024-04-26 ENCOUNTER — Telehealth: Payer: Self-pay

## 2024-04-26 NOTE — Telephone Encounter (Signed)
 Auth Submission: NO AUTH NEEDED Site of care: Site of care: [facility] Payer: [employer] dual complete plus [place] medicaid Medication & CPT/J Code(s) submitted: Feraheme  (ferumoxytol ) [phone] Route of submission (phone, fax, portal):  Phone # Fax # Auth type: Buy/Bill PB Units/visits requested: 510mg  x 2 doses Reference number:  Approval from: 04/26/24 to 08/26/24

## 2024-04-27 ENCOUNTER — Telehealth (HOSPITAL_COMMUNITY): Payer: Self-pay

## 2024-04-27 ENCOUNTER — Encounter: Payer: Self-pay | Admitting: Nurse Practitioner

## 2024-04-27 ENCOUNTER — Ambulatory Visit: Attending: Nurse Practitioner | Admitting: Nurse Practitioner

## 2024-04-27 VITALS — BP 120/60 | HR 78 | Ht 63.0 in | Wt 117.6 lb

## 2024-04-27 DIAGNOSIS — E785 Hyperlipidemia, unspecified: Secondary | ICD-10-CM

## 2024-04-27 DIAGNOSIS — I2510 Atherosclerotic heart disease of native coronary artery without angina pectoris: Secondary | ICD-10-CM

## 2024-04-27 DIAGNOSIS — I739 Peripheral vascular disease, unspecified: Secondary | ICD-10-CM | POA: Diagnosis not present

## 2024-04-27 DIAGNOSIS — I6523 Occlusion and stenosis of bilateral carotid arteries: Secondary | ICD-10-CM | POA: Diagnosis not present

## 2024-04-27 DIAGNOSIS — Z86718 Personal history of other venous thrombosis and embolism: Secondary | ICD-10-CM | POA: Diagnosis not present

## 2024-04-27 DIAGNOSIS — E1110 Type 2 diabetes mellitus with ketoacidosis without coma: Secondary | ICD-10-CM

## 2024-04-27 DIAGNOSIS — Z794 Long term (current) use of insulin: Secondary | ICD-10-CM

## 2024-04-27 DIAGNOSIS — I10 Essential (primary) hypertension: Secondary | ICD-10-CM

## 2024-04-27 MED ORDER — CLONIDINE HCL 0.1 MG PO TABS: 0.1000 mg | ORAL_TABLET | Freq: Two times a day (BID) | ORAL | 2 refills | Status: AC

## 2024-04-27 NOTE — Progress Notes (Signed)
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 ) 25 MG tablet Take 75 mg by mouth at bedtime.      Blood Glucose Monitoring Suppl (ACCU-CHEK GUIDE ME) w/Device KIT 4 (four) times daily. for testing     Calcium  Carbonate Antacid (TUMS PO) Take 2 tablets by mouth 3 (three) times daily as needed (acid indigestion).      carvedilol  (COREG ) 6.25 MG tablet Take 1 tablet (6.25 mg total) by mouth 2 (two) times daily with a meal. 180 tablet 3   cholecalciferol (VITAMIN D3) 25 MCG (1000 UNIT) tablet Take 1,000 Units by mouth in the morning.     Continuous Blood Gluc Receiver (FREESTYLE LIBRE 14 DAY READER) DEVI 1 each by Other route as needed (blood glucose).     ezetimibe  (ZETIA ) 10 MG tablet Take 1 tablet (10 mg total) by mouth every evening. 90 tablet 3   ferrous sulfate  325 (65 FE) MG tablet Take 650 mg by mouth in the morning.     furosemide  (LASIX ) 40 MG tablet Take 40 mg by mouth daily.     gabapentin  (NEURONTIN ) 600 MG tablet Take 0.5 tablets (300 mg  total) by mouth 3 (three) times daily. 90 tablet 2   Glucagon 3 MG/DOSE POWD Place 1 spray into the nose as needed (low blood sugar).     glucose blood (ACCU-CHEK AVIVA PLUS) test strip 1 each by Other route 2 (two) times daily. And lancets 2/day 200 each 3   hydroxypropyl methylcellulose / hypromellose (ISOPTO TEARS / GONIOVISC) 2.5 % ophthalmic solution Place 1 drop into both eyes 3 (three) times daily as needed (dry/irritated eyes.).     insulin  degludec (TRESIBA  FLEXTOUCH) 200 UNIT/ML FlexTouch Pen Inject 14 Units into the skin in the morning.     insulin  lispro (HUMALOG ) 100 UNIT/ML injection Inject 1-10 Units into the skin 3 (three) times daily before meals. Per sliding scale     isosorbide  mononitrate (IMDUR ) 120 MG 24 hr tablet Take 1 tablet (120 mg total) by mouth daily. 90 tablet 3   lidocaine  (LIDODERM ) 5 % PLACE 1 PATCH ONTO SKIN DAILY, REMOVE AND DISCARD WITHIN 12 HOURS OR AS DIRECTED (Patient taking differently: Place 1 patch onto the skin daily as needed (pain.).) 90 patch 1   nitroGLYCERIN  (NITROSTAT ) 0.4 MG SL tablet PLACE 1 TABLET UNDER THE TONGUE EVERY 5 MINUTES AS NEEDED FOR CHEST PAIN 25 tablet 7   NURTEC 75 MG TBDP Take 75 mg by mouth daily as needed (migraine).     oxyCODONE -acetaminophen  (PERCOCET) 10-325 MG tablet Take 1 tablet by mouth 4 (four) times daily as needed for pain.   0   pantoprazole  (PROTONIX ) 40 MG tablet TAKE 1 TABLET BY MOUTH TWICE DAILY BEFORE A MEAL 180 tablet 0   polyethylene glycol powder (MIRALAX ) 17 GM/SCOOP powder Take 17 g by mouth daily as needed for mild constipation.  REPATHA  SURECLICK 140 MG/ML SOAJ INJECT 1 PEN INTO THE SKIN EVERY 14 DAYS 6 mL 3   rosuvastatin  (CRESTOR ) 40 MG tablet TAKE 1 TABLET(40 MG) BY MOUTH AT BEDTIME 90 tablet 3   Vitamin D , Ergocalciferol , (DRISDOL ) 50000 units CAPS capsule Take 50,000 Units by mouth every Wednesday.   10   XARELTO  15 MG TABS tablet TAKE 1 TABLET(15 MG) BY MOUTH EVERY EVENING 90 tablet 1   amLODipine   (NORVASC ) 2.5 MG tablet Take 1 tablet (2.5 mg total) by mouth daily. 90 tablet 1   cloNIDine  (CATAPRES ) 0.1 MG tablet Take 1 tablet (0.1 mg total) by mouth 2 (two) times daily. 180 tablet 2   diclofenac  Sodium (VOLTAREN ) 1 % GEL Apply 1 application. topically daily as needed (pain). Apply to feet for pain as needed (Patient not taking: Reported on 04/27/2024) 150 g 2   torsemide  (DEMADEX ) 20 MG tablet Take 20 mg by mouth 2 (two) times daily. (Patient not taking: Reported on 04/27/2024)     No current facility-administered medications for this visit.     Review of Systems    She denies chest pain, palpitations, dyspnea, pnd, orthopnea, n, v, dizziness, syncope, edema, weight gain, or early satiety. All other systems reviewed and are otherwise negative except as noted above.   Physical Exam    VS:  BP 120/60   Pulse 78   Ht 5\' 3"  (1.6 m)   Wt 117 lb 9.6 oz (53.3 kg)   LMP  (LMP Unknown)   SpO2 94%   BMI 20.83 kg/m  GEN: Well nourished, well developed, in no acute distress. HEENT: normal. Neck: Supple, no JVD, carotid bruits, or masses. Cardiac: RRR, no murmurs, rubs, or gallops. No clubbing, cyanosis, edema.  Radials/DP/PT 2+ and equal bilaterally.  Respiratory:  Respirations regular and unlabored, clear to auscultation bilaterally. GI: Soft, nontender, nondistended, BS + x 4. MS: no deformity or atrophy. Skin: warm and dry, no rash. Neuro:  Strength and sensation are intact. Psych: Normal affect.  Accessory Clinical Findings    ECG personally reviewed by me today -    - no EKG in office today.   Lab Results  Component Value Date   WBC 8.1 04/21/2023   HGB 11.1 04/21/2023   HCT 36.8 04/21/2023   MCV 73 (L) 04/21/2023   PLT 343 04/21/2023   Lab Results  Component Value Date   CREATININE 1.96 (H) 04/21/2023   BUN 23 04/21/2023   NA 129 (L) 04/21/2023   K 4.7 04/21/2023   CL 97 04/21/2023   CO2 19 (L) 04/21/2023   Lab Results  Component Value Date   ALT 16 09/25/2022    AST 24 04/16/2022   ALKPHOS 95 04/16/2022   BILITOT <0.2 04/16/2022   Lab Results  Component Value Date   CHOL 182 09/25/2022   HDL 60 09/25/2022   LDLCALC 97 09/25/2022   TRIG 142 09/25/2022   CHOLHDL 3.0 09/25/2022    Lab Results  Component Value Date   HGBA1C 10.9 (H) 08/02/2021    Assessment & Plan    1. CAD: S/p DES-RCA in 2018. Cath in September 2022 showed patent mid RCA stent, 30% mid LCx lesion, 60% distal LCx stenosis with EF 55 to 60%, unchanged when compared to prior cath. She has had a few episodes of fleeting chest discomfort in the setting of significant stress and anxiety.  She denies exertional symptoms. Continue to monitor.  Reviewed ED precautions.  No ASA in the setting of chronic DOAC  therapy.  Continue amlodipine , carvedilol , clonidine , Imdur , Crestor , and Zetia .  2. Murmur: Echocardiogram in 09/2023 showed EF 50 to 55%, low normal LV function, moderate concentric LVH, G1 DD, normal RV systolic function, no significant valvular abnormalities. Euvolemic and well compensated on exam.  Continue current medications as above.  3. PAD: S/p PTCA-L SFA in 2017, PTCA-mid to distal right SFA stenting of the right common iliac artery in 2018. ABIs  in 08/2023 showed moderate right lower extremity arterial disease, moderate left lower extremity arterial disease, overall stable. Ongoing medical therapy was recommended.  No ASA in the setting of chronic DOAC therapy. Continue Repatha , Zetia . Followed by Dr. Alvenia Aus.    4. Subclavian artery/carotid artery stenosis: Most recent carotid ultrasound in 08/2022 revealed stable 40 to 59% R ICA stenosis, 1 to 39% LICA stenosis, right subclavian artery flow was disturbed.  Asymptomatic.  Consider repeat study as clinically indicated.  Continue Repatha , Zetia .  5. 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  7. Hypertension: Recent labile BP.  She ran out of her clonidine .  Will provide refill today. Advised to keep a BP log and report SBP  consistently less than 100 mmHg, or consistently greater than 130 mmHg. For now, continue current antihypertensive regimen.   8. Hyperlipidemia: LDL was 97 in 09/2022.  Will update fasting lipids, LFTs prior to next follow-up visit.  Continue Repatha , Zetia .  9. CKD stage IV: Creatinine was 3.39 in 01/2024.  Following with nephrology.  She has been told she will likely require dialysis in the future.  10. Type 2 diabetes: A1c was 7.4 in 03/2024.  Followed by endocrinology.  11. Anemia/History of GI bleed: She has a history of GI bleed, with recent concern for melena. She has required blood and iron transfusions.     Hemoglobin was stable at 9.8 on 03/30/2024.  She reports she is pending capsule study next week. Following with GI.    12. Disposition: Follow-up as scheduled Dr. Micael Adas in 06/2024.       Jude Norton, NP 04/27/2024, 5:35 PM

## 2024-04-27 NOTE — Telephone Encounter (Signed)
 Auth Submission: NO AUTH NEEDED Site of care: Site of care: [facility] Payer: [employer] dual complete Medication & CPT/J Code(s) submitted: retacrit [phone] Route of submission (phone, fax, portal): portal Phone # Fax # Auth type: Buy/Bill PB Units/visits requested: 15,000u, q14days Reference number: [id number] Approval from: 04/27/24 to 11/23/24

## 2024-04-27 NOTE — Patient Instructions (Signed)
 Medication Instructions:  Your physician recommends that you continue on your current medications as directed. Please refer to the Current Medication list given to you today.  *If you need a refill on your cardiac medications before your next appointment, please call your pharmacy*  Lab Work: Fasting lipid panel & LFTs at your convenience   Testing/Procedures: NONE ordered at this time of appointment   Follow-Up: At [facility], you and your health needs are our priority.  As part of our continuing mission to provide you with exceptional heart care, our providers are all part of one team.  This team includes your primary Cardiologist (physician) and Advanced Practice Providers or APPs (Physician Assistants and Nurse Practitioners) who all work together to provide you with the care you need, when you need it.  Your next appointment:    Keep appointment  Provider:   Gaylyn Keas, MD    We recommend signing up for the patient portal called "MyChart".  Sign up information is provided on this After Visit Summary.  MyChart is used to connect with patients for Virtual Visits (Telemedicine).  Patients are able to view lab/test results, encounter notes, upcoming appointments, etc.  Non-urgent messages can be sent to your provider as well.   To learn more about what you can do with MyChart, go to [email or web address].   Other Instructions Monitor blood pressure. Report systolic BP (top number) if it's consistently less than 100 or consistently greater than 130.

## 2024-04-28 ENCOUNTER — Encounter (HOSPITAL_COMMUNITY)
Admission: RE | Admit: 2024-04-28 | Discharge: 2024-04-28 | Disposition: A | Discharge status: Home / Self Care | Source: Ambulatory Visit | Attending: Nephrology | Admitting: Nephrology

## 2024-04-28 VITALS — BP 163/60 | HR 79 | Temp 98.1°F | Resp 16

## 2024-04-28 DIAGNOSIS — N183 Chronic kidney disease, stage 3 unspecified: Secondary | ICD-10-CM | POA: Insufficient documentation

## 2024-04-28 DIAGNOSIS — N184 Chronic kidney disease, stage 4 (severe): Secondary | ICD-10-CM | POA: Insufficient documentation

## 2024-04-28 DIAGNOSIS — D631 Anemia in chronic kidney disease: Secondary | ICD-10-CM | POA: Insufficient documentation

## 2024-04-28 LAB — POCT HEMOGLOBIN-HEMACUE: Hemoglobin: 9.8 g/dL — ABNORMAL LOW (ref 12.0–15.0)

## 2024-04-28 MED ORDER — EPOETIN ALFA-EPBX 10000 UNIT/ML IJ SOLN
15000.0000 [IU] | INTRAMUSCULAR | Status: DC
  Administered 2024-04-28: 15000 [IU] via SUBCUTANEOUS

## 2024-04-28 MED ORDER — SODIUM CHLORIDE 0.9 % IV SOLN
510.0000 mg | INTRAVENOUS | Status: DC
  Administered 2024-04-28: 510 mg via INTRAVENOUS
  Filled 2024-04-28: qty 510

## 2024-04-28 MED ORDER — EPOETIN ALFA-EPBX 10000 UNIT/ML IJ SOLN
INTRAMUSCULAR | Status: AC
  Filled 2024-04-28: qty 2

## 2024-04-29 ENCOUNTER — Encounter (HOSPITAL_COMMUNITY)

## 2024-05-04 ENCOUNTER — Ambulatory Visit (INDEPENDENT_AMBULATORY_CARE_PROVIDER_SITE_OTHER): Admitting: Podiatry

## 2024-05-04 DIAGNOSIS — Z91199 Patient's noncompliance with other medical treatment and regimen due to unspecified reason: Secondary | ICD-10-CM

## 2024-05-04 NOTE — Progress Notes (Signed)
 No show

## 2024-05-09 ENCOUNTER — Other Ambulatory Visit (HOSPITAL_COMMUNITY): Payer: Self-pay | Admitting: Cardiovascular Disease

## 2024-05-09 ENCOUNTER — Telehealth: Payer: Self-pay | Admitting: Cardiovascular Disease

## 2024-05-09 ENCOUNTER — Telehealth: Payer: Self-pay

## 2024-05-09 DIAGNOSIS — I700 Atherosclerosis of aorta: Secondary | ICD-10-CM

## 2024-05-09 DIAGNOSIS — I739 Peripheral vascular disease, unspecified: Secondary | ICD-10-CM

## 2024-05-09 NOTE — Telephone Encounter (Signed)
 INFORMED [facility] THAT OUR OFFICE HAS RECEIVED PREOP CLEARANCE REQUEST     Pre-operative Risk Assessment    Patient Name: Alejandra Hernandez  DOB: 08-06-1956 MRN: [id number]   Date of last office visit: 04/27/24 Marlana Silvan, NP Date of next office visit: 06/30/24 Gaylyn Keas, MD   Request for Surgical Clearance    Procedure:  EGD OR PUSH ENTEROSCOPY  Date of Surgery:  Clearance TBD (URGENT)                                Surgeon:  DR Buzz Cass Surgeon's Group or Practice Name: [facility] [place] Phone number:  [phone] Fax number:  [phone]   Type of Clearance Requested:   - Medical  - Pharmacy:  Hold Rivaroxaban  (Xarelto ) 2-3 DAYS PRIOR TO THE PROCEDURE   Type of Anesthesia:  PROPOFOL    Additional requests/questions:    Signed, Collin Deal   05/09/2024, 2:06 PM

## 2024-05-09 NOTE — Telephone Encounter (Signed)
 Alejandra Hernandez a triage RN with [facility] is calling requesting a cb once fax clearance request is received. She is sending it to [phone] and left her direct line for cb. Please advise.

## 2024-05-10 NOTE — Telephone Encounter (Signed)
 Sherline Distel,  You saw this patient on 04/27/2024. Per office protocol, will you please comment on medical clearance for EGD or push enteroscopy?  Please route your response to P [facility] Preop. I will communicate with requesting office once you have given recommendations.   Thank you!  Morey Ar, NP

## 2024-05-10 NOTE — Telephone Encounter (Signed)
 Please advise holding Xarelto  prior to EGD or push enteroscopy.  Thank you!  DW

## 2024-05-11 NOTE — Telephone Encounter (Signed)
 Patient with diagnosis of DVT on Xarelto  for anticoagulation.    Procedure:  EGD OR PUSH ENTEROSCOPY  Date of procedure: urgent  CrCl 22.8 ml/min Platelet count 298  Per office protocol, patient can hold Xarelto  for 3 days prior to procedure.    **This guidance is not considered finalized until pre-operative APP has relayed final recommendations.**

## 2024-05-11 NOTE — Telephone Encounter (Signed)
   Name: Alejandra Hernandez  DOB: 11-17-1956  MRN: [id number]   Primary Cardiologist: Gaylyn Keas, MD  Chart reviewed as part of pre-operative protocol coverage.   Per Rada Buerger NP: She was overall stable when I saw her, able to complete greater than 4 METS. She is at acceptable risk for upcoming endoscopy procedure.   Per office protocol, patient can hold Xarelto  for 3 days prior to procedure.       I will route this recommendation to the requesting party via Epic fax function and remove from pre-op  pool. Please call with questions.  Warren Haber Muhanad Torosyan, PA 05/11/2024, 3:05 PM

## 2024-05-12 ENCOUNTER — Encounter (HOSPITAL_COMMUNITY)
Admission: RE | Admit: 2024-05-12 | Discharge: 2024-05-12 | Disposition: A | Discharge status: Home / Self Care | Source: Ambulatory Visit | Attending: Nephrology

## 2024-05-12 VITALS — BP 156/59 | HR 69 | Temp 98.3°F | Resp 17

## 2024-05-12 DIAGNOSIS — N183 Chronic kidney disease, stage 3 unspecified: Secondary | ICD-10-CM | POA: Diagnosis not present

## 2024-05-12 LAB — POCT HEMOGLOBIN-HEMACUE: Hemoglobin: 9.5 g/dL — ABNORMAL LOW (ref 12.0–15.0)

## 2024-05-12 MED ORDER — EPOETIN ALFA-EPBX 10000 UNIT/ML IJ SOLN
15000.0000 [IU] | INTRAMUSCULAR | Status: DC
  Administered 2024-05-12: 15000 [IU] via SUBCUTANEOUS

## 2024-05-12 MED ORDER — EPOETIN ALFA-EPBX 10000 UNIT/ML IJ SOLN
INTRAMUSCULAR | Status: AC
  Filled 2024-05-12: qty 2

## 2024-05-12 MED ORDER — SODIUM CHLORIDE 0.9 % IV SOLN
510.0000 mg | INTRAVENOUS | Status: AC
  Administered 2024-05-12: 510 mg via INTRAVENOUS
  Filled 2024-05-12: qty 510

## 2024-05-12 NOTE — Telephone Encounter (Signed)
 Caller Amalia Badder) is following-up on the status of patient's clearance.  Caller stated she will not be in the office tomorrow but can speak to anyone in the office.

## 2024-05-12 NOTE — Telephone Encounter (Signed)
 Tried calling requesting office to make them aware clearance has been sent and I just resent it wasn't able to speak to no one left a detailed message

## 2024-05-25 ENCOUNTER — Encounter (HOSPITAL_COMMUNITY)

## 2024-05-26 ENCOUNTER — Encounter (HOSPITAL_COMMUNITY)

## 2024-06-02 NOTE — Telephone Encounter (Signed)
 Patient aware can hold anticoagulation as below, please schedule.

## 2024-06-02 NOTE — Telephone Encounter (Signed)
 Lvm patient returned call

## 2024-06-08 ENCOUNTER — Encounter (HOSPITAL_COMMUNITY)
Admission: RE | Admit: 2024-06-08 | Discharge: 2024-06-08 | Disposition: A | Discharge status: Home / Self Care | Source: Ambulatory Visit | Attending: Nephrology | Admitting: Nephrology

## 2024-06-08 VITALS — BP 144/65 | HR 65 | Temp 97.3°F | Resp 16

## 2024-06-08 DIAGNOSIS — N183 Chronic kidney disease, stage 3 unspecified: Secondary | ICD-10-CM | POA: Diagnosis present

## 2024-06-08 LAB — IRON AND TIBC
Iron: 120 ug/dL (ref 28–170)
Saturation Ratios: 52 % — ABNORMAL HIGH (ref 10.4–31.8)
TIBC: 230 ug/dL — ABNORMAL LOW (ref 250–450)
UIBC: 110 ug/dL

## 2024-06-08 LAB — POCT HEMOGLOBIN-HEMACUE: Hemoglobin: 10.3 g/dL — ABNORMAL LOW (ref 12.0–15.0)

## 2024-06-08 LAB — FERRITIN: Ferritin: 169 ng/mL (ref 11–307)

## 2024-06-08 MED ORDER — EPOETIN ALFA-EPBX 10000 UNIT/ML IJ SOLN
INTRAMUSCULAR | Status: AC
  Filled 2024-06-08: qty 2

## 2024-06-08 MED ORDER — EPOETIN ALFA-EPBX 10000 UNIT/ML IJ SOLN
15000.0000 [IU] | INTRAMUSCULAR | Status: DC
  Administered 2024-06-08: 15000 [IU] via SUBCUTANEOUS

## 2024-06-09 ENCOUNTER — Encounter (HOSPITAL_COMMUNITY)

## 2024-06-09 ENCOUNTER — Other Ambulatory Visit: Payer: Self-pay | Admitting: *Deleted

## 2024-06-09 DIAGNOSIS — N186 End stage renal disease: Secondary | ICD-10-CM

## 2024-06-13 ENCOUNTER — Ambulatory Visit (INDEPENDENT_AMBULATORY_CARE_PROVIDER_SITE_OTHER): Admitting: Podiatry

## 2024-06-13 ENCOUNTER — Encounter: Payer: Self-pay | Admitting: Podiatry

## 2024-06-13 DIAGNOSIS — I739 Peripheral vascular disease, unspecified: Secondary | ICD-10-CM

## 2024-06-13 DIAGNOSIS — B351 Tinea unguium: Secondary | ICD-10-CM | POA: Diagnosis not present

## 2024-06-13 DIAGNOSIS — M79675 Pain in left toe(s): Secondary | ICD-10-CM

## 2024-06-13 DIAGNOSIS — E1159 Type 2 diabetes mellitus with other circulatory complications: Secondary | ICD-10-CM | POA: Diagnosis not present

## 2024-06-13 DIAGNOSIS — M79674 Pain in right toe(s): Secondary | ICD-10-CM | POA: Diagnosis not present

## 2024-06-13 NOTE — Progress Notes (Signed)
 Subjective: Alejandra Hernandez is a 68 y.o. female patient with history of diabetes who presents to office today complaining of long,mildly painful nails  while ambulating in shoes; unable to trim. Last A1c was 7.4.   PCP:  Norval Kettle, MD   .  No other pedal complaints noted.  Patient Active Problem List   Diagnosis Date Noted   Chronic anticoagulation 08/03/2021   Nausea vomiting and diarrhea 05/19/2021   Dehydration 05/19/2021   Hyponatremia 05/18/2021   Hypomagnesemia 12/27/2020   Abnormal findings on diagnostic imaging of other specified body structures 08/27/2020   Disorder of mineral metabolism, unspecified 08/27/2020   Personal history of other venous thrombosis and embolism 08/27/2020   Pulmonary nodule 08/03/2020   Acidosis 07/06/2020   Complaints of total body pain 07/10/2019   Pain, unspecified 07/10/2019   Left upper extremity swelling 06/03/2019   COPD, 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   Leukocytosis 04/14/2019   Avascular necrosis of humeral head, left (HCC) 04/14/2019   Hypokalemia 04/14/2019   Neuropathy 04/14/2019   DKA (diabetic ketoacidosis) (HCC) 04/13/2019   Polyp of transverse colon    Atypical chest pain 11/16/2018   Diabetes mellitus, type II (HCC) 05/23/2018   CKD (chronic kidney disease), stage III (HCC) 05/14/2018   Unstable angina (HCC) 05/14/2018   Hyperglycemia 12/23/2017   Low TSH level 06/22/2017   CAD (coronary artery disease)    Nodule on liver 05/22/2017   Liver disease, unspecified 05/22/2017   Heart palpitations 05/14/2017   Unspecified skin changes 04/18/2017   Tobacco use 03/17/2017   Benign neoplasm of descending colon    Benign neoplasm of sigmoid colon    Chronic diastolic CHF (congestive heart failure) (HCC) 02/09/2017   History of DVT (deep vein thrombosis)    Abnormal CT scan, chest    Acute urinary retention    Claudication (HCC) 07/23/2016   PAD (peripheral artery disease) (HCC)    Essential hypertension 05/14/2016   Uncontrolled type  2 diabetes mellitus with ketoacidosis without coma, with long-term current use of insulin  (HCC)    GERD (gastroesophageal reflux disease) 05/17/2014   Carotid artery disease (HCC) 05/17/2014   Iron deficiency anemia 04/26/2014   Dyspnea 04/25/2014   Carotid artery bruit 04/25/2014   Hyperlipidemia    Gout    Current Outpatient Medications on File Prior to Visit  Medication Sig Dispense Refill   acetaminophen  (TYLENOL ) 500 MG tablet Take 500-1,000 mg by mouth every 6 (six) hours as needed (pain.).     albuterol  (PROAIR  HFA) 108 (90 Base) MCG/ACT inhaler Inhale 2 puffs into the lungs every 4 (four) hours as needed for wheezing or shortness of breath. 1 Inhaler 5   amitriptyline  (ELAVIL ) 25 MG tablet Take 75 mg by mouth at bedtime.      amLODipine  (NORVASC ) 2.5 MG tablet Take 1 tablet (2.5 mg total) by mouth daily. 90 tablet 1   Blood Glucose Monitoring Suppl (ACCU-CHEK GUIDE ME) w/Device KIT 4 (four) times daily. for testing     Calcium  Carbonate Antacid (TUMS PO) Take 2 tablets by mouth 3 (three) times daily as needed (acid indigestion).      carvedilol  (COREG ) 6.25 MG tablet Take 1 tablet (6.25 mg total) by mouth 2 (two) times daily with a meal. 180 tablet 3   cholecalciferol (VITAMIN D3) 25 MCG (1000 UNIT) tablet Take 1,000 Units by mouth in the morning.     cloNIDine  (CATAPRES ) 0.1 MG tablet Take 1 tablet (0.1 mg total) by mouth 2 (two) times daily. 180 tablet 2  Continuous Blood Gluc Receiver (FREESTYLE LIBRE 14 DAY READER) DEVI 1 each by Other route as needed (blood glucose).     diclofenac  Sodium (VOLTAREN ) 1 % GEL Apply 1 application. topically daily as needed (pain). Apply to feet for pain as needed 150 g 2   ezetimibe  (ZETIA ) 10 MG tablet Take 1 tablet (10 mg total) by mouth every evening. 90 tablet 3   ferrous sulfate  325 (65 FE) MG tablet Take 650 mg by mouth in the morning.     furosemide  (LASIX ) 40 MG tablet Take 40 mg by mouth daily.     gabapentin  (NEURONTIN ) 600 MG tablet  Take 0.5 tablets (300 mg total) by mouth 3 (three) times daily. 90 tablet 2   Glucagon 3 MG/DOSE POWD Place 1 spray into the nose as needed (low blood sugar).     glucose blood (ACCU-CHEK AVIVA PLUS) test strip 1 each by Other route 2 (two) times daily. And lancets 2/day 200 each 3   hydroxypropyl methylcellulose / hypromellose (ISOPTO TEARS / GONIOVISC) 2.5 % ophthalmic solution Place 1 drop into both eyes 3 (three) times daily as needed (dry/irritated eyes.).     insulin  degludec (TRESIBA  FLEXTOUCH) 200 UNIT/ML FlexTouch Pen Inject 14 Units into the skin in the morning.     insulin  lispro (HUMALOG ) 100 UNIT/ML injection Inject 1-10 Units into the skin 3 (three) times daily before meals. Per sliding scale     isosorbide  mononitrate (IMDUR ) 120 MG 24 hr tablet Take 1 tablet (120 mg total) by mouth daily. 90 tablet 3   lidocaine  (LIDODERM ) 5 % PLACE 1 PATCH ONTO SKIN DAILY, REMOVE AND DISCARD WITHIN 12 HOURS OR AS DIRECTED (Patient taking differently: Place 1 patch onto the skin daily as needed (pain.).) 90 patch 1   nitroGLYCERIN  (NITROSTAT ) 0.4 MG SL tablet PLACE 1 TABLET UNDER THE TONGUE EVERY 5 MINUTES AS NEEDED FOR CHEST PAIN 25 tablet 7   NURTEC 75 MG TBDP Take 75 mg by mouth daily as needed (migraine).     oxyCODONE -acetaminophen  (PERCOCET) 10-325 MG tablet Take 1 tablet by mouth 4 (four) times daily as needed for pain.   0   pantoprazole  (PROTONIX ) 40 MG tablet TAKE 1 TABLET BY MOUTH TWICE DAILY BEFORE A MEAL 180 tablet 0   polyethylene glycol powder (MIRALAX ) 17 GM/SCOOP powder Take 17 g by mouth daily as needed for mild constipation.     REPATHA  SURECLICK 140 MG/ML SOAJ INJECT 1 PEN INTO THE SKIN EVERY 14 DAYS 6 mL 3   rosuvastatin  (CRESTOR ) 40 MG tablet TAKE 1 TABLET(40 MG) BY MOUTH AT BEDTIME 90 tablet 3   torsemide  (DEMADEX ) 20 MG tablet Take 20 mg by mouth 2 (two) times daily.     Vitamin D , Ergocalciferol , (DRISDOL ) 50000 units CAPS capsule Take 50,000 Units by mouth every Wednesday.    10   XARELTO  15 MG TABS tablet TAKE 1 TABLET(15 MG) BY MOUTH EVERY EVENING 90 tablet 1   No current facility-administered medications on file prior to visit.   Allergies  Allergen Reactions   Lisinopril  Hives   Losartan  Other (See Comments)    Pt reports causes GI issues   Sodium Bicarbonate Other (See Comments)    Pt reports causes GI issues      Objective: General: Patient is awake, alert, and oriented x 3 and in no acute distress.  Integument: Skin is warm, dry and supple bilateral. Nails are tender, long, thickened and dystrophic with subungual debris, consistent with onychomycosis, 1-5 bilateral. No signs of  infection. Mild callus to bilateral hallux, Blister noted to anterior leg.  remaining integument unremarkable.  Vasculature:  Dorsalis Pedis pulse 1/4 bilateral. Posterior Tibial pulse  1/4 bilateral. Capillary fill time <3 sec 1-5 bilateral. Positive hair growth to the level of the digits.Temperature gradient within normal limits.  1+ pitting edema bilateral ankles however easily displaced to be able to lightly palpate the pulses.  Pitting edema is worse on the left as compared to the right.  Neurology: The patient has diminished sensation measured with a 5.07/10g Semmes Weinstein Monofilament at all pedal sites bilateral. Vibratory sensation diminished bilateral with tuning fork. No Babinski sign present bilateral.   Musculoskeletal: Asymptomatic hallux limitus, pes planus, hammertoe pedal deformities noted bilateral. Muscular strength 4/5 in all lower extremity muscular groups bilateral without pain on range of motion.  Pain with walking calf that goes away with rest concerning for claudication.  No tenderness with calf compression bilateral.  No acute signs of DVT.  Assessment and Plan: Problem List Items Addressed This Visit       Cardiovascular and Mediastinum   PAD (peripheral artery disease) (HCC)     Endocrine   Diabetes mellitus, type II (HCC)   Other Visit  Diagnoses       Pain due to onychomycosis of toenails of both feet    -  Primary         -Examined patient. -Re-Discussed and educated patient on diabetic foot care  -Mechanically debrided all nails 1-5 bilateral using sterile nail nipper and filed with dremel without incident  -DM shoes to [place]  -Patient to return  in 3 months for at risk foot nail care and callus care -Patient advised to call the office if any problems or questions arise in the meantime.  Asberry Failing, DPM

## 2024-06-20 ENCOUNTER — Other Ambulatory Visit: Payer: Self-pay | Admitting: Cardiovascular Disease

## 2024-06-22 ENCOUNTER — Ambulatory Visit (HOSPITAL_BASED_OUTPATIENT_CLINIC_OR_DEPARTMENT_OTHER)
Admission: RE | Admit: 2024-06-22 | Discharge: 2024-06-22 | Disposition: A | Discharge status: Home / Self Care | Source: Ambulatory Visit | Attending: Vascular Surgery | Admitting: Vascular Surgery

## 2024-06-22 ENCOUNTER — Ambulatory Visit (HOSPITAL_COMMUNITY)
Admission: RE | Admit: 2024-06-22 | Discharge: 2024-06-22 | Disposition: A | Discharge status: Home / Self Care | Source: Ambulatory Visit | Attending: Vascular Surgery | Admitting: Vascular Surgery

## 2024-06-22 ENCOUNTER — Ambulatory Visit (HOSPITAL_BASED_OUTPATIENT_CLINIC_OR_DEPARTMENT_OTHER)
Admission: RE | Admit: 2024-06-22 | Discharge: 2024-06-22 | Discharge status: Home / Self Care | Source: Ambulatory Visit | Attending: Vascular Surgery

## 2024-06-22 ENCOUNTER — Encounter (HOSPITAL_COMMUNITY)

## 2024-06-22 ENCOUNTER — Ambulatory Visit: Payer: Self-pay | Admitting: Cardiovascular Disease

## 2024-06-22 DIAGNOSIS — I700 Atherosclerosis of aorta: Secondary | ICD-10-CM

## 2024-06-22 DIAGNOSIS — I739 Peripheral vascular disease, unspecified: Secondary | ICD-10-CM | POA: Insufficient documentation

## 2024-06-22 DIAGNOSIS — N186 End stage renal disease: Secondary | ICD-10-CM | POA: Insufficient documentation

## 2024-06-22 LAB — VAS US ABI WITH/WO TBI
Left ABI: 0.95
Right ABI: 1.02

## 2024-06-23 ENCOUNTER — Encounter (HOSPITAL_COMMUNITY)
Admission: RE | Admit: 2024-06-23 | Discharge: 2024-06-23 | Disposition: A | Discharge status: Home / Self Care | Source: Ambulatory Visit | Attending: Nephrology | Admitting: Nephrology

## 2024-06-23 VITALS — BP 169/61 | HR 70 | Temp 97.3°F | Resp 16

## 2024-06-23 DIAGNOSIS — N183 Chronic kidney disease, stage 3 unspecified: Secondary | ICD-10-CM | POA: Diagnosis not present

## 2024-06-23 LAB — POCT HEMOGLOBIN-HEMACUE: Hemoglobin: 10.8 g/dL — ABNORMAL LOW (ref 12.0–15.0)

## 2024-06-23 MED ORDER — EPOETIN ALFA-EPBX 10000 UNIT/ML IJ SOLN
15000.0000 [IU] | INTRAMUSCULAR | Status: DC
  Administered 2024-06-23: 15000 [IU] via SUBCUTANEOUS

## 2024-06-23 MED ORDER — EPOETIN ALFA-EPBX 10000 UNIT/ML IJ SOLN
INTRAMUSCULAR | Status: AC
Start: 2024-06-23 — End: 2024-06-23
  Filled 2024-06-23: qty 2
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
## 2024-06-24 ENCOUNTER — Encounter: Payer: Self-pay | Admitting: Vascular Surgery

## 2024-06-24 ENCOUNTER — Other Ambulatory Visit: Payer: Self-pay

## 2024-06-24 ENCOUNTER — Ambulatory Visit: Attending: Vascular Surgery | Admitting: Vascular Surgery

## 2024-06-24 VITALS — BP 197/82 | HR 66 | Temp 98.2°F | Ht 63.0 in | Wt 127.0 lb

## 2024-06-24 DIAGNOSIS — N186 End stage renal disease: Secondary | ICD-10-CM

## 2024-06-24 DIAGNOSIS — N185 Chronic kidney disease, stage 5: Secondary | ICD-10-CM
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
## 2024-06-30 ENCOUNTER — Ambulatory Visit: Admitting: Cardiology

## 2024-06-30 DIAGNOSIS — I2510 Atherosclerotic heart disease of native coronary artery without angina pectoris: Secondary | ICD-10-CM

## 2024-06-30 DIAGNOSIS — Z86718 Personal history of other venous thrombosis and embolism: Secondary | ICD-10-CM

## 2024-06-30 DIAGNOSIS — I10 Essential (primary) hypertension: Secondary | ICD-10-CM

## 2024-06-30 DIAGNOSIS — I739 Peripheral vascular disease, unspecified: Secondary | ICD-10-CM

## 2024-06-30 DIAGNOSIS — R002 Palpitations: Secondary | ICD-10-CM

## 2024-06-30 DIAGNOSIS — E785 Hyperlipidemia, unspecified: Secondary | ICD-10-CM

## 2024-06-30 DIAGNOSIS — I6523 Occlusion and stenosis of bilateral carotid arteries: Secondary | ICD-10-CM

## 2024-07-07 ENCOUNTER — Encounter (HOSPITAL_COMMUNITY)
Admission: RE | Admit: 2024-07-07 | Discharge: 2024-07-07 | Disposition: A | Discharge status: Home / Self Care | Source: Ambulatory Visit | Attending: Nephrology | Admitting: Nephrology

## 2024-07-07 VITALS — BP 131/64 | HR 52 | Temp 97.2°F | Resp 16

## 2024-07-07 DIAGNOSIS — D631 Anemia in chronic kidney disease: Secondary | ICD-10-CM | POA: Insufficient documentation

## 2024-07-07 DIAGNOSIS — N183 Chronic kidney disease, stage 3 unspecified: Secondary | ICD-10-CM | POA: Diagnosis present

## 2024-07-07 LAB — IRON AND TIBC
Iron: 82 ug/dL (ref 28–170)
Saturation Ratios: 37 % — ABNORMAL HIGH (ref 10.4–31.8)
TIBC: 220 ug/dL — ABNORMAL LOW (ref 250–450)
UIBC: 138 ug/dL

## 2024-07-07 LAB — FERRITIN: Ferritin: 288 ng/mL (ref 11–307)

## 2024-07-07 LAB — POCT HEMOGLOBIN-HEMACUE: Hemoglobin: 10.7 g/dL — ABNORMAL LOW (ref 12.0–15.0)

## 2024-07-07 MED ORDER — EPOETIN ALFA-EPBX 10000 UNIT/ML IJ SOLN
INTRAMUSCULAR | Status: AC
Start: 2024-07-07 — End: 2024-07-07
  Filled 2024-07-07: qty 2

## 2024-07-07 MED ORDER — EPOETIN ALFA-EPBX 10000 UNIT/ML IJ SOLN
15000.0000 [IU] | INTRAMUSCULAR | Status: DC
  Administered 2024-07-07: 15000 [IU] via SUBCUTANEOUS

## 2024-07-12 ENCOUNTER — Ambulatory Visit: Attending: Cardiovascular Disease | Admitting: Cardiovascular Disease

## 2024-07-12 VITALS — BP 132/71 | HR 63 | Resp 16 | Ht 63.0 in | Wt 129.0 lb

## 2024-07-12 DIAGNOSIS — I779 Disorder of arteries and arterioles, unspecified: Secondary | ICD-10-CM | POA: Diagnosis not present

## 2024-07-12 DIAGNOSIS — Z86718 Personal history of other venous thrombosis and embolism: Secondary | ICD-10-CM

## 2024-07-12 DIAGNOSIS — E785 Hyperlipidemia, unspecified: Secondary | ICD-10-CM | POA: Diagnosis not present

## 2024-07-12 DIAGNOSIS — I739 Peripheral vascular disease, unspecified: Secondary | ICD-10-CM

## 2024-07-12 DIAGNOSIS — I2510 Atherosclerotic heart disease of native coronary artery without angina pectoris: Secondary | ICD-10-CM

## 2024-07-12 DIAGNOSIS — Z720 Tobacco use: Secondary | ICD-10-CM

## 2024-07-12 DIAGNOSIS — I872 Venous insufficiency (chronic) (peripheral): Secondary | ICD-10-CM

## 2024-07-12 DIAGNOSIS — I10 Essential (primary) hypertension: Secondary | ICD-10-CM
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
## 2024-07-12 NOTE — Patient Instructions (Signed)
 Medication Instructions:  No changes *If you need a refill on your cardiac medications before your next appointment, please call your pharmacy*  Lab Work: None ordered If you have labs (blood work) drawn today and your tests are completely normal, you will receive your results only by: MyChart Message (if you have MyChart) OR A paper copy in the mail If you have any lab test that is abnormal or we need to change your treatment, we will call you to review the results.  Testing/Procedures: Your physician has requested that you have a carotid duplex. This test is an ultrasound of the carotid arteries in your neck. It looks at blood flow through these arteries that supply the brain with blood. Allow one hour for this exam. There are no restrictions or special instructions. This will take place at [address], 4th floor  Please note: We ask at that you not bring children with you during ultrasound (echo/ vascular) testing. Due to room size and safety concerns, children are not allowed in the ultrasound rooms during exams. Our front office staff cannot provide observation of children in our lobby area while testing is being conducted. An adult accompanying a patient to their appointment will only be allowed in the ultrasound room at the discretion of the ultrasound technician under special circumstances. We apologize for any inconvenience.   Follow-Up: At [facility], you and your health needs are our priority.  As part of our continuing mission to provide you with exceptional heart care, our providers are all part of one team.  This team includes your primary Cardiologist (physician) and Advanced Practice Providers or APPs (Physician Assistants and Nurse Practitioners) who all work together to provide you with the care you need, when you need it.  Your next appointment:   6 month(s)  Provider:   Dr. Darron  We recommend signing up for the patient portal called MyChart.  Sign up  information is provided on this After Visit Summary.  MyChart is used to connect with patients for Virtual Visits (Telemedicine).  Patients are able to view lab/test results, encounter notes, upcoming appointments, etc.  Non-urgent messages can be sent to your provider as well.   To learn more about what you can do with MyChart, go to [email or web address].

## 2024-07-18 ENCOUNTER — Other Ambulatory Visit: Payer: Self-pay

## 2024-07-18 ENCOUNTER — Encounter (HOSPITAL_COMMUNITY): Payer: Self-pay | Admitting: Vascular Surgery

## 2024-07-18 NOTE — Progress Notes (Signed)
 Anesthesia Chart Review: Same day workup  68 year old female some-day smoker follows with cardiology for history of PAD, HTN, HLD, moderate bilateral carotid disease, right subclavian artery stenosis, bilateral DVT 2018 on Xarelto , CAD s/p prior RCA stent.  Most recent cath 07/2021 showed patent RCA stent with no significant restenosis and mild to moderate nonobstructive disease otherwise.  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  She is known to have peripheral arterial disease with severe claudication. She is status post left SFA directional arthrectomy and drug-coated balloon angioplasty in 2017, drug-coated balloon angioplasty to the mid and distal right SFA in 2018 and right common iliac artery stent placement.  Stable by angiography 04/29/2023.  Last seen by Dr. Darron 07/12/2024 and noted to be stable from cardiac standpoint.  Discussed progression to ESRD and upcoming AV fistula placement.  Other pertinent history includes GIB, GERD on PPI, migraines, CKD 5, IDDM 2, peripheral neuropathy, anemia.  Patient instructed by vascular surgery to hold Xarelto  2 days prior to procedure.  Patient will need day of surgery labs and evaluation.  EKG 07/12/2024: Normal sinus rhythm.  Rate 62. ST & T wave abnormality, consider inferolateral ischemia  TTE 10/13/2023: 1. 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 Left ventricular ejection fraction, by  estimation, is 50 to 55%. Left ventricular ejection fraction by 3D volume  is 53 %. The left ventricle has low  normal function. The left ventricle demonstrates regional wall motion  abnormalities (see scoring diagram/findings for description). There is  moderate concentric left ventricular hypertrophy. Left ventricular  diastolic parameters are consistent with Grade   I diastolic dysfunction  (impaired relaxation). Elevated left atrial  pressure. The average left ventricular global longitudinal strain is -16.8  %. The global longitudinal strain is abnormal.   2. Right ventricular systolic function is normal. The right ventricular  size is normal. There is normal pulmonary artery systolic pressure. The  estimated right ventricular systolic pressure is 33.0 mmHg.   3. Left atrial size was moderately dilated.   4. The mitral valve is normal in structure. Trivial mitral valve  regurgitation.   5. The aortic valve is tricuspid. Aortic valve regurgitation is not  visualized. Aortic valve sclerosis is present, with no evidence of aortic  valve stenosis.   6. The inferior vena cava is dilated in size with >50% respiratory  variability, suggesting right atrial pressure of 8 mmHg.   Comparison(s): Prior images reviewed side by side. The inferobasal  aneurysm was present on the study from September 2022 and probably also in  May 2022. In the absence of corresponding coronary lesions, consider  sarcoidosis.   Cath 08/02/2021:   Mid Cx lesion is 30% stenosed.   Dist Cx lesion is 60% stenosed.   Non-stenotic Mid RCA lesion was previously treated.   The left ventricular systolic function is normal.   LV end diastolic pressure is normal.   The left ventricular ejection fraction is 55-65% by visual estimate.   Nonobstructive CAD Normal LV function Normal LVEDP   Plan: there is no change from 2019 to explain her chest pain. Continue medical therapy.    Lynwood Geofm RIGGERS [facility] Short Stay Center/Anesthesiology Phone [phone] 07/18/2024 10:37 AM

## 2024-07-18 NOTE — Anesthesia Preprocedure Evaluation (Signed)
 Anesthesia Evaluation  Patient identified by MRN, date of birth, ID band Patient awake    Reviewed: Allergy & Precautions, NPO status , Patient's Chart, lab work & pertinent test results  Airway Mallampati: II  TM Distance: >3 FB Neck ROM: Full    Dental no notable dental hx. (+) Edentulous Upper, Edentulous Lower   Pulmonary COPD, Current Smoker   Pulmonary exam normal        Cardiovascular hypertension, Pt. on medications and Pt. on home beta blockers + CAD, + Past MI, + Cardiac Stents, + Peripheral Vascular Disease, +CHF and + DVT   Rhythm:Regular Rate:Normal  TTE 10/13/2023: 1. 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 Left ventricular ejection fraction, by  estimation, is 50 to 55%. Left ventricular ejection fraction by 3D volume  is 53 %. The left ventricle has low  normal function. The left ventricle demonstrates regional wall motion  abnormalities (see scoring diagram/findings for description). There is  moderate concentric left ventricular hypertrophy. Left ventricular  diastolic parameters are consistent with Grade   I diastolic dysfunction (impaired relaxation). Elevated left atrial  pressure. The average left ventricular global longitudinal strain is -16.8  %. The global longitudinal strain is abnormal.   2. Right ventricular systolic function is normal. The right ventricular  size is normal. There is normal pulmonary artery systolic pressure. The  estimated right ventricular systolic pressure is 33.0 mmHg.   3. Left atrial size was moderately dilated.   4. The mitral valve is normal in structure. Trivial mitral valve  regurgitation.   5. The aortic valve is tricuspid. Aortic valve regurgitation is not  visualized. Aortic valve sclerosis is present, with no evidence of aortic  valve stenosis.   6. The inferior vena cava is dilated in size with >50% respiratory   variability, suggesting right atrial pressure of 8 mmHg.     Neuro/Psych  Headaches  Anxiety Depression       GI/Hepatic Neg liver ROS, PUD,GERD  Medicated,,  Endo/Other  diabetes, Type 2, Insulin  Dependent    Renal/GU ESRFRenal disease  negative genitourinary   Musculoskeletal  (+) Arthritis , Osteoarthritis,    Abdominal Normal abdominal exam  (+)   Peds  Hematology  (+) Blood dyscrasia, anemia Lab Results      Component                Value               Date                      WBC                      8.1                 04/21/2023                HGB                      12.9                07/19/2024                HCT                      38.0                07/19/2024  MCV                      73 (L)              04/21/2023                PLT                      343                 04/21/2023             Lab Results      Component                Value               Date                      NA                       121 (L)             07/19/2024                K                        3.6                 07/19/2024                CO2                      19 (L)              04/21/2023                GLUCOSE                  115 (H)             07/19/2024                BUN                      40 (H)              07/19/2024                CREATININE               4.00 (H)            07/19/2024                CALCIUM                   8.7                 04/21/2023                GFR                      33.67 (L)           02/25/2022                EGFR                     28 (L)  04/21/2023                GFRNONAA                 34 (L)              08/03/2021              Anesthesia Other Findings   Reproductive/Obstetrics                              Anesthesia Physical Anesthesia Plan  ASA: 3  Anesthesia Plan: MAC and Regional   Post-op Pain Management: Regional block*   Induction:  Intravenous  PONV Risk Score and Plan: 1 and Ondansetron , Dexamethasone, Treatment may vary due to age or medical condition and Midazolam   Airway Management Planned: Simple Face Mask and Nasal Cannula  Additional Equipment: None  Intra-op Plan:   Post-operative Plan:   Informed Consent: I have reviewed the patients History and Physical, chart, labs and discussed the procedure including the risks, benefits and alternatives for the proposed anesthesia with the patient or authorized representative who has indicated his/her understanding and acceptance.     Dental advisory given  Plan Discussed with: CRNA  Anesthesia Plan Comments: (PAT note by Lynwood Hope, PA-C: 68 year old female some-day smoker follows with cardiology for history of PAD, HTN, HLD, moderate bilateral carotid disease, right subclavian artery stenosis, bilateral DVT 2018 on Xarelto , CAD s/p prior RCA stent.  Most recent cath 07/2021 showed patent RCA stent with no significant restenosis and mild to moderate nonobstructive disease otherwise.  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  She is known to have peripheral arterial disease with severe claudication. She is status post left SFA directional arthrectomy and drug-coated balloon angioplasty in 2017, drug-coated balloon angioplasty to the mid and distal right SFA in 2018 and right common iliac artery stent placement.  Stable by angiography 04/29/2023.  Last seen by Dr. Darron 07/12/2024 and noted to be stable from cardiac standpoint.  Discussed progression to ESRD and upcoming AV fistula placement.  Other pertinent history includes GIB, GERD on PPI, migraines, CKD 5, IDDM 2, peripheral neuropathy, anemia.  Patient instructed by vascular surgery to hold Xarelto  2 days prior to procedure.  Patient will need day of surgery labs and evaluation.  EKG 07/12/2024: Normal sinus  rhythm.  Rate 62. ST & T wave abnormality, consider inferolateral ischemia  TTE 10/13/2023: 1. 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 Left ventricular ejection fraction, by  estimation, is 50 to 55%. Left ventricular ejection fraction by 3D volume  is 53 %. The left ventricle has low  normal function. The left ventricle demonstrates regional wall motion  abnormalities (see scoring diagram/findings for description). There is  moderate concentric left ventricular hypertrophy. Left ventricular  diastolic parameters are consistent with Grade  I diastolic dysfunction (impaired relaxation). Elevated left atrial  pressure. The average left ventricular global longitudinal strain is -16.8  %. The global longitudinal strain is abnormal.  2. Right ventricular systolic function is normal. The right ventricular  size is normal. There is normal pulmonary artery systolic pressure. The  estimated right ventricular systolic pressure is 33.0 mmHg.  3. Left atrial size was moderately dilated.  4. The mitral valve is normal in structure. Trivial mitral valve  regurgitation.  5. The aortic valve is tricuspid. Aortic valve regurgitation is not  visualized. Aortic valve sclerosis is present, with no evidence of aortic  valve stenosis.  6. The inferior vena cava is dilated in size with >50% respiratory  variability, suggesting right atrial pressure of 8 mmHg.   Comparison(s): Prior images reviewed side by side. The inferobasal  aneurysm was present on the study from September 2022 and probably also in  May 2022. In the absence of corresponding coronary lesions, consider  sarcoidosis.   Cath 08/02/2021:   Mid Cx lesion is 30% stenosed.   Dist Cx lesion is 60% stenosed.   Non-stenotic Mid RCA lesion was previously treated.   The left ventricular systolic function is normal.   LV end diastolic pressure is normal.   The left ventricular  ejection fraction is 55-65% by visual estimate.  1. Nonobstructive CAD 2. Normal LV function 3. Normal LVEDP  Plan: there is no change from 2019 to explain her chest pain. Continue medical therapy.   )         Anesthesia Quick Evaluation

## 2024-07-18 NOTE — Progress Notes (Signed)
 PCP - Norval Kettle, MD  Cardiologist - Shlomo Wilbert SAUNDERS, MD [facility] - Cardiology Darron Deatrice LABOR, MD   PPM/ICD - denies Device Orders - n/a Rep Notified - n/a  Chest x-ray - denies EKG - 07-12-24 Stress Test - denies ECHO - 10-13-23 Cardiac Cath - 08-02-2021  CPAP - denies  GLP-1 -denies  Fasting Blood Sugar -  Per patient blood sugar as been running low during the night past two nights they have 46 and night before 50.  Per patient not eating well due to dentures. But other days blood sugar range 150. Checks Blood Sugar 5-6 times a day  Blood Thinner Instructions: XARELTO  Last dose 07-16-24 Aspirin  Instructions: denies  ERAS Protcol - NPO  COVID TEST- n/a  Anesthesia review: Yes, Hx of HTN, DM, CAD, Hx of DVT  Patient verbally denies any shortness of breath, fever, cough and chest pain during phone call   -------------  SDW INSTRUCTIONS given:  Your procedure is scheduled on July 19, 2024.  Report to [facility] Main Entrance A at 5:30 A.M., and check in at the Admitting office.  Call this number if you have problems the morning of surgery:  [phone]   Remember:  Do not eat or drink after midnight the night before your surgery     Take these medicines the morning of surgery with A SIP OF WATER  acetaminophen  (TYLENOL )  albuterol  inhaler  amLODipine  (NORVASC )  cloNIDine  (CATAPRES )  gabapentin  (NEURONTIN )  Glucagon POWD  isosorbide  mononitrate (IMDUR )  nitroGLYCERIN  (NITROSTAT )  oxyCODONE -acetaminophen  (PERCOCET)  pantoprazole  (PROTONIX )   WHAT DO I DO ABOUT MY DIABETES MEDICATION?   Do not take oral diabetes medicines (pills) the morning of surgery.  THE MORNING BEFORE SURGERY, take 1/2 (half) dose of correction insulin  if blood sugar greater than 220       THE MORNING OF SURGERY, take 7 units of TRESIBA  insulin .  The day of surgery, do not take other diabetes injectables, including Byetta (exenatide), Bydureon (exenatide ER), Victoza  (liraglutide), or Trulicity (dulaglutide).  If your CBG is greater than 220 mg/dL, you may take  of your sliding scale (correction) dose of insulin .   HOW TO MANAGE YOUR DIABETES BEFORE AND AFTER SURGERY  Why is it important to control my blood sugar before and after surgery? Improving blood sugar levels before and after surgery helps healing and can limit problems. A way of improving blood sugar control is eating a healthy diet by:  Eating less sugar and carbohydrates  Increasing activity/exercise  Talking with your doctor about reaching your blood sugar goals High blood sugars (greater than 180 mg/dL) can raise your risk of infections and slow your recovery, so you will need to focus on controlling your diabetes during the weeks before surgery. Make sure that the doctor who takes care of your diabetes knows about your planned surgery including the date and location.  How do I manage my blood sugar before surgery? Check your blood sugar at least 4 times a day, starting 2 days before surgery, to make sure that the level is not too high or low.  Check your blood sugar the morning of your surgery when you wake up and every 2 hours until you get to the Short Stay unit.  If your blood sugar is less than 70 mg/dL, you will need to treat for low blood sugar: Do not take insulin . Treat a low blood sugar (less than 70 mg/dL) with  cup of clear juice (cranberry or apple), 4 glucose  tablets, OR glucose gel. Recheck blood sugar in 15 minutes after treatment (to make sure it is greater than 70 mg/dL). If your blood sugar is not greater than 70 mg/dL on recheck, call [phone] for further instructions. Report your blood sugar to the short stay nurse when you get to Short Stay.  If you are admitted to the hospital after surgery: Your blood sugar will be checked by the staff and you will probably be given insulin  after surgery (instead of oral diabetes medicines) to make sure you have good blood  sugar levels. The goal for blood sugar control after surgery is 80-180 mg/dL.  As of today, STOP taking any Aspirin  (unless otherwise instructed by your surgeon) Aleve, Naproxen, Ibuprofen, Motrin, Advil, Goody's, BC's, all herbal medications, fish oil, and all vitamins. diclofenac  Sodium (VOLTAREN )                      Do not wear jewelry, make up, or nail polish            Do not wear lotions, powders, perfumes/colognes, or deodorant.            Do not shave 48 hours prior to surgery.  Men may shave face and neck.            Do not bring valuables to the hospital.            [facility] is not responsible for any belongings or valuables.  Do NOT Smoke (Tobacco/Vaping) 24 hours prior to your procedure If you use a CPAP at night, you may bring all equipment for your overnight stay.   Contacts, glasses, dentures or bridgework may not be worn into surgery.      For patients admitted to the hospital, discharge time will be determined by your treatment team.   Patients discharged the day of surgery will not be allowed to drive home, and someone needs to stay with them for 24 hours.    Special instructions:   [place]- Preparing For Surgery  Before surgery, you can play an important role. Because skin is not sterile, your skin needs to be as free of germs as possible. You can reduce the number of germs on your skin by washing with CHG (chlorahexidine gluconate) Soap before surgery.  CHG is an antiseptic cleaner which kills germs and bonds with the skin to continue killing germs even after washing.    Oral Hygiene is also important to reduce your risk of infection.  Remember - BRUSH YOUR TEETH THE MORNING OF SURGERY WITH YOUR REGULAR TOOTHPASTE  Please do not use if you have an allergy to CHG or antibacterial soaps. If your skin becomes reddened/irritated stop using the CHG.  Do not shave (including legs and underarms) for at least 48 hours prior to first CHG shower. It is OK to shave your  face.  Please follow these instructions carefully.   Shower the NIGHT BEFORE SURGERY and the MORNING OF SURGERY with DIAL Soap.   Pat yourself dry with a CLEAN TOWEL.  Wear CLEAN PAJAMAS to bed the night before surgery  Place CLEAN SHEETS on your bed the night of your first shower and DO NOT SLEEP WITH PETS.   Day of Surgery: Please shower morning of surgery  Wear Clean/Comfortable clothing the morning of surgery Do not apply any deodorants/lotions.   Remember to brush your teeth WITH YOUR REGULAR TOOTHPASTE.   Questions were answered. Patient verbalized understanding of instructions.

## 2024-07-19 ENCOUNTER — Encounter (HOSPITAL_COMMUNITY)
Admission: RE | Disposition: A | Discharge status: Home / Self Care | Payer: Self-pay | Source: Home / Self Care | Attending: Vascular Surgery

## 2024-07-19 ENCOUNTER — Ambulatory Visit (HOSPITAL_COMMUNITY): Admitting: Physician Assistant

## 2024-07-19 ENCOUNTER — Encounter (HOSPITAL_COMMUNITY): Payer: Self-pay | Admitting: Vascular Surgery

## 2024-07-19 ENCOUNTER — Other Ambulatory Visit: Payer: Self-pay

## 2024-07-19 ENCOUNTER — Ambulatory Visit (HOSPITAL_COMMUNITY)
Admission: RE | Admit: 2024-07-19 | Discharge: 2024-07-19 | Disposition: A | Discharge status: Home / Self Care | Attending: Vascular Surgery | Admitting: Vascular Surgery

## 2024-07-19 DIAGNOSIS — I132 Hypertensive heart and chronic kidney disease with heart failure and with stage 5 chronic kidney disease, or end stage renal disease: Secondary | ICD-10-CM | POA: Diagnosis not present

## 2024-07-19 DIAGNOSIS — I252 Old myocardial infarction: Secondary | ICD-10-CM | POA: Diagnosis not present

## 2024-07-19 DIAGNOSIS — E1151 Type 2 diabetes mellitus with diabetic peripheral angiopathy without gangrene: Secondary | ICD-10-CM | POA: Diagnosis not present

## 2024-07-19 DIAGNOSIS — I509 Heart failure, unspecified: Secondary | ICD-10-CM | POA: Insufficient documentation

## 2024-07-19 DIAGNOSIS — N185 Chronic kidney disease, stage 5: Secondary | ICD-10-CM | POA: Diagnosis present

## 2024-07-19 DIAGNOSIS — F17210 Nicotine dependence, cigarettes, uncomplicated: Secondary | ICD-10-CM | POA: Insufficient documentation

## 2024-07-19 DIAGNOSIS — F17290 Nicotine dependence, other tobacco product, uncomplicated: Secondary | ICD-10-CM | POA: Insufficient documentation

## 2024-07-19 DIAGNOSIS — I5032 Chronic diastolic (congestive) heart failure: Secondary | ICD-10-CM

## 2024-07-19 DIAGNOSIS — N186 End stage renal disease: Secondary | ICD-10-CM

## 2024-07-19 DIAGNOSIS — Z86718 Personal history of other venous thrombosis and embolism: Secondary | ICD-10-CM | POA: Insufficient documentation

## 2024-07-19 DIAGNOSIS — I2510 Atherosclerotic heart disease of native coronary artery without angina pectoris: Secondary | ICD-10-CM | POA: Diagnosis not present

## 2024-07-19 DIAGNOSIS — E785 Hyperlipidemia, unspecified: Secondary | ICD-10-CM | POA: Insufficient documentation

## 2024-07-19 DIAGNOSIS — Z992 Dependence on renal dialysis: Secondary | ICD-10-CM | POA: Diagnosis not present

## 2024-07-19 DIAGNOSIS — Z955 Presence of coronary angioplasty implant and graft: Secondary | ICD-10-CM | POA: Diagnosis not present

## 2024-07-19 DIAGNOSIS — D631 Anemia in chronic kidney disease: Secondary | ICD-10-CM | POA: Insufficient documentation

## 2024-07-19 DIAGNOSIS — Z7901 Long term (current) use of anticoagulants: Secondary | ICD-10-CM | POA: Insufficient documentation

## 2024-07-19 DIAGNOSIS — E1122 Type 2 diabetes mellitus with diabetic chronic kidney disease: Secondary | ICD-10-CM | POA: Diagnosis not present

## 2024-07-19 DIAGNOSIS — Z794 Long term (current) use of insulin: Secondary | ICD-10-CM | POA: Diagnosis not present

## 2024-07-19 DIAGNOSIS — Z8711 Personal history of peptic ulcer disease: Secondary | ICD-10-CM | POA: Diagnosis not present

## 2024-07-19 DIAGNOSIS — K219 Gastro-esophageal reflux disease without esophagitis: Secondary | ICD-10-CM | POA: Diagnosis not present

## 2024-07-19 HISTORY — PX: INSERTION OF ARTERIOVENOUS (AV) ARTEGRAFT ARM: SHX6779

## 2024-07-19 LAB — GLUCOSE, CAPILLARY
Glucose-Capillary: 131 mg/dL — ABNORMAL HIGH (ref 70–99)
Glucose-Capillary: 178 mg/dL — ABNORMAL HIGH (ref 70–99)

## 2024-07-19 LAB — POCT I-STAT, CHEM 8
BUN: 40 mg/dL — ABNORMAL HIGH (ref 8–23)
Calcium, Ion: 0.96 mmol/L — ABNORMAL LOW (ref 1.15–1.40)
Chloride: 91 mmol/L — ABNORMAL LOW (ref 98–111)
Creatinine, Ser: 4 mg/dL — ABNORMAL HIGH (ref 0.44–1.00)
Glucose, Bld: 115 mg/dL — ABNORMAL HIGH (ref 70–99)
HCT: 38 % (ref 36.0–46.0)
Hemoglobin: 12.9 g/dL (ref 12.0–15.0)
Potassium: 3.6 mmol/L (ref 3.5–5.1)
Sodium: 121 mmol/L — ABNORMAL LOW (ref 135–145)
TCO2: 19 mmol/L — ABNORMAL LOW (ref 22–32)

## 2024-07-19 SURGERY — INSERTION, GRAFT, ARTERIOVENOUS, UPPER EXTREMITY
Anesthesia: Monitor Anesthesia Care | Laterality: Left

## 2024-07-19 MED ORDER — FENTANYL CITRATE (PF) 250 MCG/5ML IJ SOLN
INTRAMUSCULAR | Status: DC | PRN
  Administered 2024-07-19: 100 ug via INTRAVENOUS
  Administered 2024-07-19: 50 ug via INTRAVENOUS

## 2024-07-19 MED ORDER — MIDAZOLAM HCL 2 MG/2ML IJ SOLN
INTRAMUSCULAR | Status: DC | PRN
  Administered 2024-07-19: 1 mg via INTRAVENOUS

## 2024-07-19 MED ORDER — ACETAMINOPHEN 10 MG/ML IV SOLN: 1000.0000 mg | Freq: Once | INTRAVENOUS | Status: DC | PRN

## 2024-07-19 MED ORDER — CHLORHEXIDINE GLUCONATE 4 % EX SOLN: 60.0000 mL | Freq: Once | CUTANEOUS | Status: DC

## 2024-07-19 MED ORDER — ORAL CARE MOUTH RINSE: 15.0000 mL | Freq: Once | OROMUCOSAL | Status: AC

## 2024-07-19 MED ORDER — HEPARIN SODIUM (PORCINE) 1000 UNIT/ML IJ SOLN
INTRAMUSCULAR | Status: DC | PRN
  Administered 2024-07-19: 3000 [IU] via INTRAVENOUS

## 2024-07-19 MED ORDER — LIDOCAINE-EPINEPHRINE (PF) 1 %-1:200000 IJ SOLN
INTRAMUSCULAR | Status: DC | PRN
  Administered 2024-07-19: 5 mL

## 2024-07-19 MED ORDER — LIDOCAINE-EPINEPHRINE (PF) 1.5 %-1:200000 IJ SOLN
INTRAMUSCULAR | Status: DC | PRN
Start: 2024-07-19 — End: 2024-07-19
  Administered 2024-07-19: 30 mL via PERINEURAL

## 2024-07-19 MED ORDER — MIDAZOLAM HCL 2 MG/2ML IJ SOLN
INTRAMUSCULAR | Status: AC
  Filled 2024-07-19: qty 2

## 2024-07-19 MED ORDER — ONDANSETRON HCL 4 MG/2ML IJ SOLN
INTRAMUSCULAR | Status: DC | PRN
  Administered 2024-07-19: 4 mg via INTRAVENOUS

## 2024-07-19 MED ORDER — 0.9 % SODIUM CHLORIDE (POUR BTL) OPTIME
TOPICAL | Status: DC | PRN
  Administered 2024-07-19: 1000 mL

## 2024-07-19 MED ORDER — LIDOCAINE-EPINEPHRINE (PF) 1 %-1:200000 IJ SOLN
INTRAMUSCULAR | Status: AC
  Filled 2024-07-19: qty 30

## 2024-07-19 MED ORDER — CEFAZOLIN SODIUM-DEXTROSE 2-4 GM/100ML-% IV SOLN
INTRAVENOUS | Status: AC
  Filled 2024-07-19: qty 100

## 2024-07-19 MED ORDER — HEPARIN SODIUM (PORCINE) 1000 UNIT/ML IJ SOLN
INTRAMUSCULAR | Status: AC
Start: 2024-07-19 — End: 2024-07-19
  Filled 2024-07-19: qty 30

## 2024-07-19 MED ORDER — FENTANYL CITRATE (PF) 250 MCG/5ML IJ SOLN
INTRAMUSCULAR | Status: AC
  Filled 2024-07-19: qty 5

## 2024-07-19 MED ORDER — LACTATED RINGERS IV SOLN: INTRAVENOUS | Status: DC

## 2024-07-19 MED ORDER — SODIUM CHLORIDE 0.9 % IV SOLN: INTRAVENOUS | Status: DC

## 2024-07-19 MED ORDER — FENTANYL CITRATE (PF) 100 MCG/2ML IJ SOLN: 25.0000 ug | INTRAMUSCULAR | Status: DC | PRN

## 2024-07-19 MED ORDER — PHENYLEPHRINE HCL-NACL 20-0.9 MG/250ML-% IV SOLN
INTRAVENOUS | Status: DC | PRN
  Administered 2024-07-19: 35 ug/min via INTRAVENOUS

## 2024-07-19 MED ORDER — PROPOFOL 10 MG/ML IV BOLUS
INTRAVENOUS | Status: AC
  Filled 2024-07-19: qty 20

## 2024-07-19 MED ORDER — PROPOFOL 500 MG/50ML IV EMUL
INTRAVENOUS | Status: DC | PRN
  Administered 2024-07-19: 85 ug/kg/min via INTRAVENOUS

## 2024-07-19 MED ORDER — CHLORHEXIDINE GLUCONATE 0.12 % MT SOLN
OROMUCOSAL | Status: AC
  Administered 2024-07-19: 15 mL via OROMUCOSAL
  Filled 2024-07-19: qty 15

## 2024-07-19 MED ORDER — HEPARIN 6000 UNIT IRRIGATION SOLUTION
Status: DC | PRN
  Administered 2024-07-19: 1

## 2024-07-19 MED ORDER — CEFAZOLIN SODIUM-DEXTROSE 2-4 GM/100ML-% IV SOLN
2.0000 g | INTRAVENOUS | Status: AC
  Administered 2024-07-19: 2 g via INTRAVENOUS

## 2024-07-19 MED ORDER — HEMOSTATIC AGENTS (NO CHARGE) OPTIME
TOPICAL | Status: DC | PRN
  Administered 2024-07-19: 1 via TOPICAL

## 2024-07-19 MED ORDER — CHLORHEXIDINE GLUCONATE 0.12 % MT SOLN: 15.0000 mL | Freq: Once | OROMUCOSAL | Status: AC

## 2024-07-19 MED ORDER — HEPARIN 6000 UNIT IRRIGATION SOLUTION
Status: AC
  Filled 2024-07-19: qty 500

## 2024-07-19 SURGICAL SUPPLY — 30 items
ARMBAND PINK RESTRICT EXTREMIT (MISCELLANEOUS) ×2 IMPLANT
BAG COUNTER SPONGE SURGICOUNT (BAG) ×1 IMPLANT
CANISTER SUCTION 3000ML PPV (SUCTIONS) ×1 IMPLANT
CLIP TI MEDIUM 24 (CLIP) ×1 IMPLANT
CLIP TI MEDIUM 6 (CLIP) ×1 IMPLANT
CLIP TI WIDE RED SMALL 24 (CLIP) ×1 IMPLANT
CLIP TI WIDE RED SMALL 6 (CLIP) ×1 IMPLANT
DERMABOND ADVANCED .7 DNX12 (GAUZE/BANDAGES/DRESSINGS) ×1 IMPLANT
ELECTRODE REM PT RTRN 9FT ADLT (ELECTROSURGICAL) ×1 IMPLANT
GLOVE BIOGEL PI IND STRL 7.0 (GLOVE) ×1 IMPLANT
GOWN STRL REUS W/ TWL LRG LVL3 (GOWN DISPOSABLE) ×2 IMPLANT
GOWN STRL REUS W/ TWL XL LVL3 (GOWN DISPOSABLE) ×1 IMPLANT
GRAFT GORETEX STRT 4-7X45 (Vascular Products) IMPLANT
HEMOSTAT SNOW SURGICEL 2X4 (HEMOSTASIS) IMPLANT
INSERT FOGARTY SM (MISCELLANEOUS) ×1 IMPLANT
KIT BASIN OR (CUSTOM PROCEDURE TRAY) ×1 IMPLANT
KIT TURNOVER KIT B (KITS) ×1 IMPLANT
NS IRRIG 1000ML POUR BTL (IV SOLUTION) ×1 IMPLANT
PACK CV ACCESS (CUSTOM PROCEDURE TRAY) ×1 IMPLANT
PAD ARMBOARD POSITIONER FOAM (MISCELLANEOUS) ×2 IMPLANT
POWDER SURGICEL 3.0 GRAM (HEMOSTASIS) IMPLANT
SLING ARM FOAM STRAP LRG (SOFTGOODS) IMPLANT
SUT MNCRL AB 4-0 PS2 18 (SUTURE) IMPLANT
SUT PROLENE 6 0 BV (SUTURE) IMPLANT
SUT SILK 2 0 SH (SUTURE) IMPLANT
SUT VIC AB 3-0 SH 27X BRD (SUTURE) ×2 IMPLANT
SYR TOOMEY 50ML (SYRINGE) IMPLANT
TOWEL GREEN STERILE (TOWEL DISPOSABLE) ×1 IMPLANT
UNDERPAD 30X36 HEAVY ABSORB (UNDERPADS AND DIAPERS) ×1 IMPLANT
WATER STERILE IRR 1000ML POUR (IV SOLUTION) ×1 IMPLANT

## 2024-07-19 NOTE — Discharge Instructions (Signed)
   [facility]  Discharge Instructions  AV Fistula or Graft Surgery for Dialysis Access  Please refer to the following instructions for your post-procedure care. Your surgeon or physician assistant will discuss any changes with you.  Activity  You may drive the day following your surgery, if you are comfortable and no longer taking prescription pain medication. Resume full activity as the soreness in your incision resolves.  Bathing/Showering  You may shower after you go home. Keep your incision dry for 48 hours. Do not soak in a bathtub, hot tub, or swim until the incision heals completely. You may not shower if you have a hemodialysis catheter.  Incision Care  Clean your incision with mild soap and water after 48 hours. Pat the area dry with a clean towel. You do not need a bandage unless otherwise instructed. Do not apply any ointments or creams to your incision. You may have skin glue on your incision. Do not peel it off. It will come off on its own in about one week. Your arm may swell a bit after surgery. To reduce swelling use pillows to elevate your arm so it is above your heart. Your doctor will tell you if you need to lightly wrap your arm with an ACE bandage.  Diet  Resume your normal diet. There are not special food restrictions following this procedure. In order to heal from your surgery, it is CRITICAL to get adequate nutrition. Your body requires vitamins, minerals, and protein. Vegetables are the best source of vitamins and minerals. Vegetables also provide the perfect balance of protein. Processed food has little nutritional value, so try to avoid this.  Medications  Resume taking all of your medications. If your incision is causing pain, you may take over-the counter pain relievers such as acetaminophen  (Tylenol ). If you were prescribed a stronger pain medication, please be aware these medications can cause nausea and constipation. Prevent  nausea by taking the medication with a snack or meal. Avoid constipation by drinking plenty of fluids and eating foods with high amount of fiber, such as fruits, vegetables, and grains.  Do not take Tylenol  if you are taking prescription pain medications.  Follow up Your surgeon may want to see you in the office following your access surgery. If so, this will be arranged at the time of your surgery.  Please call us  immediately for any of the following conditions:  Increased pain, redness, drainage (pus) from your incision site Fever of 101 degrees or higher Severe or worsening pain at your incision site Hand pain or numbness.  Reduce your risk of vascular disease:  Stop smoking. If you would like help, call [facility] at 1-800-QUIT-NOW ((229)216-0077) or [place] at [phone]  Manage your cholesterol Maintain a desired weight Control your diabetes Keep your blood pressure down  Dialysis  It will take several weeks to several months for your new dialysis access to be ready for use. Your surgeon will determine when it is okay to use it. Your nephrologist will continue to direct your dialysis. You can continue to use your Permcath until your new access is ready for use.   07/19/2024 Alejandra Hernandez [id number] 06-08-1956  Surgeon(s): Pearline Norman RAMAN, MD  Procedure(s): INSERTION, GRAFT, ARTERIOVENOUS, UPPER EXTREMITY  x Do not stick graft for 4 weeks    If you have any questions, please call the office at [phone].

## 2024-07-19 NOTE — Anesthesia Procedure Notes (Signed)
 Anesthesia Regional Block: Supraclavicular block   Pre-Anesthetic Checklist: , timeout performed,  Correct Patient, Correct Site, Correct Laterality,  Correct Procedure, Correct Position, site marked,  Risks and benefits discussed,  Surgical consent,  Pre-op  evaluation,  At surgeon's request and post-op pain management  Laterality: Left  Prep: Dura Prep       Needles:  Injection technique: Single-shot  Needle Type: Echogenic Stimulator Needle     Needle Length: 5cm  Needle Gauge: 20     Additional Needles:   Procedures:,,,, ultrasound used (permanent image in chart),,    Narrative:  Start time: 07/19/2024 7:25 AM End time: 07/19/2024 7:30 AM Injection made incrementally with aspirations every 5 mL.  Performed by: Personally  Anesthesiologist: Dorethea Cordella SQUIBB, DO  Additional Notes: Patient identified. Risks/Benefits/Options discussed with patient including but not limited to bleeding, infection, nerve damage, failed block, incomplete pain control. Patient expressed understanding and wished to proceed. All questions were answered. Sterile technique was used throughout the entire procedure. Please see nursing notes for vital signs. Aspirated in 5cc intervals with injection for negative confirmation. Patient was given instructions on fall risk and not to get out of bed. All questions and concerns addressed with instructions to call with any issues or inadequate analgesia.

## 2024-07-19 NOTE — Anesthesia Postprocedure Evaluation (Signed)
 Anesthesia Post Note  Patient: Alejandra Hernandez  Procedure(s) Performed: INSERTION, GRAFT, ARTERIOVENOUS, UPPER EXTREMITY (Left)     Patient location during evaluation: PACU Anesthesia Type: Regional and MAC Level of consciousness: awake and alert Pain management: pain level controlled Vital Signs Assessment: post-procedure vital signs reviewed and stable Respiratory status: spontaneous breathing, nonlabored ventilation, respiratory function stable and patient connected to nasal cannula oxygen Cardiovascular status: stable and blood pressure returned to baseline Postop Assessment: no apparent nausea or vomiting Anesthetic complications: no   No notable events documented.  Last Vitals:  Vitals:   07/19/24 0915 07/19/24 0930  BP: 131/64 (!) 149/70  Pulse: 65 75  Resp: 13 12  Temp: (!) 36.4 C 36.4 C  SpO2: 100% 95%    Last Pain:  Vitals:   07/19/24 0930  TempSrc:   PainSc: 0-No pain                 Cordella P Dornell Grasmick

## 2024-07-19 NOTE — Transfer of Care (Signed)
 Immediate Anesthesia Transfer of Care Note  Patient: Alejandra Hernandez  Procedure(s) Performed: INSERTION, GRAFT, ARTERIOVENOUS, UPPER EXTREMITY (Left)  Patient Location: PACU  Anesthesia Type:MAC and Regional  Level of Consciousness: awake, drowsy, patient cooperative, and responds to stimulation  Airway & Oxygen Therapy: Patient Spontanous Breathing and Patient connected to face mask oxygen  Post-op Assessment: Report given to RN and Post -op Vital signs reviewed and stable   Post vital signs: Reviewed and stable  Last Vitals:  Vitals Value Taken Time  BP    Temp    Pulse 65 07/19/24 09:14  Resp 13 07/19/24 09:14  SpO2 100 % 07/19/24 09:14  Vitals shown include unfiled device data.  Last Pain:  Vitals:   07/19/24 0620  TempSrc:   PainSc: 7          Complications: No notable events documented.

## 2024-07-19 NOTE — Op Note (Signed)
 OPERATIVE NOTE  PROCEDURE:   Intraoperative left arm vein mapping left arm brachial - axillary AVG creation  PRE-OPERATIVE DIAGNOSIS: CKD stage V  POST-OPERATIVE DIAGNOSIS: same as above   SURGEON: Norman GORMAN Serve MD  ASSISTANT(S): Lucie Apt, PA  Given the complexity of the case,  the assistant was necessary in order to expedient the procedure and safely perform the technical aspects of the operation.  The assistant provided traction and countertraction to assist with exposure of the artery and vein.  They also assisted with suture ligation of multiple venous branches. They also assisted with tunneling of the graft.  They played a critical role for both anastomoses. These skills, especially following the Prolene suture for the anastomosis, could not have been adequately performed by a scrub tech assistant.  ANESTHESIA: regional  ESTIMATED BLOOD LOSS: 30 cc  FINDING(S): No adequately sized superficial veins of the left arm Palpable and doppler thrill in AVG with multiphasic and palpable radial artery on completion  SPECIMEN(S):  none  INDICATIONS:   Alejandra Hernandez is a 68 y.o. female with CKD stage V. The patient is not currently on dialysis. They were seen in the office for evaluation of hemodialysis access. The risks and benefits of access creation were reviewed including: need for additional procedures, need for additional creations, steal, ischemia monomelic neuropathy, failure of access, and bleeding. The patient expressed understand and is willing to proceed.    DESCRIPTION: The patient was brought to the operating room positioned supine on the operating table.  The left arm was prepped and draped in usual sterile fashion.  Timeout was performed and preoperative antibiotics were administered.  Intraoperative vein mapping of the arm was performed which confirmed the preoperative vein mapping and demonstrated no adequately sized superficial veins of the left arm.  The  left brachial vein at the axilla was exposed using longitudinal incision.  Incision was carried down until the brachial sheath was encountered.  The brachial vein was identified, exposed, encircled with a Silastic Vesseloop. The brachial artery was exposed using a longitudinal incision in the distal arm just above the antecubital fossa.  Incision was carried down through subcutaneous tissue until the brachial sheath was encountered.  This was incised sharply.  The brachial artery was exposed and encircled with a Silastic Vesseloop. Using a curved, sheathed tunneling device, a 4-7 mm tapered Gore-Tex graft was tunneled subcutaneously in a gentle arc across the biceps.  The patient was then heparinized with 3000 units of IV heparin .   The brachial artery was clamped proximally and distally.  An anterior arteriotomy was made with an 11 blade.  This was extended with Potts scissors.  The 4 mm end of the Gore-Tex graft was spatulated and then anastomosed end-to-side to the brachial arteriotomy using continuous running suture of 6-0 Prolene.  The anastomosis was completed and hemostasis ensured.  The brachial artery clamps were removed and the graft was clamped restoring perfusion to the hand. The brachial vein was clamped proximally and distally.  An anterior venotomy was made with an 11 blade.  This was extended with Potts scissors.  The 7 mm end of the Gore-Tex graft was then anastomosed end to side to the brachial vein venotomy using continuous running suture of 6-0 Prolene. Immediately prior to completion the anastomosis was de-aired and flushed.  Anastomosis was then completed.  Hemostasis was insured.   A doppler machine was brought onto the field to interrogate the graft.  Excellent doppler flow was noted in the  radial artery. Distal to the venous anastomosis a Doppler bruit was heard. The incisions were copiously irrigated and then closed in layers with 3-0 Vicryl and 4-0 Monocryl for the skin. Dermabond  was applied. All counts were correct at the end of the case. The patient tolerated the procedure well and was brought to recovery in stable condition.    COMPLICATIONS: none apparent  CONDITION: stable  Norman GORMAN Serve MD [facility] Phone Number: [phone] 07/19/2024 9:21 AM

## 2024-07-19 NOTE — Interval H&P Note (Signed)
 History and Physical Interval Note:  07/19/2024 7:22 AM  Alejandra Hernandez  has presented today for surgery, with the diagnosis of ESRD.  The various methods of treatment have been discussed with the patient and family. After consideration of risks, benefits and other options for treatment, the patient has consented to  Procedure(s): INSERTION, GRAFT, ARTERIOVENOUS, UPPER EXTREMITY (Left) as a surgical intervention.  The patient's history has been reviewed, patient examined, no change in status, stable for surgery.  I have reviewed the patient's chart and labs.  Questions were answered to the patient's satisfaction.     Norman GORMAN Serve

## 2024-07-20 ENCOUNTER — Encounter (HOSPITAL_COMMUNITY): Payer: Self-pay | Admitting: Vascular Surgery

## 2024-07-21 ENCOUNTER — Encounter (HOSPITAL_COMMUNITY)

## 2024-08-03 ENCOUNTER — Emergency Department (HOSPITAL_COMMUNITY): Admission: EM | Admit: 2024-08-03 | Discharge: 2024-08-04 | Discharge status: Against Medical Advice

## 2024-08-03 ENCOUNTER — Emergency Department (EMERGENCY_DEPARTMENT_HOSPITAL)

## 2024-08-03 ENCOUNTER — Other Ambulatory Visit: Payer: Self-pay

## 2024-08-03 ENCOUNTER — Telehealth: Payer: Self-pay

## 2024-08-03 ENCOUNTER — Encounter (HOSPITAL_COMMUNITY): Payer: Self-pay

## 2024-08-03 DIAGNOSIS — M79642 Pain in left hand: Secondary | ICD-10-CM | POA: Diagnosis present

## 2024-08-03 DIAGNOSIS — M7989 Other specified soft tissue disorders: Secondary | ICD-10-CM

## 2024-08-03 DIAGNOSIS — Z7901 Long term (current) use of anticoagulants: Secondary | ICD-10-CM | POA: Diagnosis not present

## 2024-08-03 DIAGNOSIS — Z5321 Procedure and treatment not carried out due to patient leaving prior to being seen by health care provider: Secondary | ICD-10-CM | POA: Diagnosis not present

## 2024-08-03 LAB — CBC
HCT: 31.9 % — ABNORMAL LOW (ref 36.0–46.0)
Hemoglobin: 9.5 g/dL — ABNORMAL LOW (ref 12.0–15.0)
MCH: 22.6 pg — ABNORMAL LOW (ref 26.0–34.0)
MCHC: 29.8 g/dL — ABNORMAL LOW (ref 30.0–36.0)
MCV: 76 fL — ABNORMAL LOW (ref 80.0–100.0)
Platelets: 321 K/uL (ref 150–400)
RBC: 4.2 MIL/uL (ref 3.87–5.11)
RDW: 17.5 % — ABNORMAL HIGH (ref 11.5–15.5)
WBC: 7.4 K/uL (ref 4.0–10.5)
nRBC: 0 % (ref 0.0–0.2)

## 2024-08-03 LAB — COMPREHENSIVE METABOLIC PANEL WITH GFR
ALT: 84 U/L — ABNORMAL HIGH (ref 0–44)
AST: 102 U/L — ABNORMAL HIGH (ref 15–41)
Albumin: 2.3 g/dL — ABNORMAL LOW (ref 3.5–5.0)
Alkaline Phosphatase: 93 U/L (ref 38–126)
Anion gap: 9 (ref 5–15)
BUN: 47 mg/dL — ABNORMAL HIGH (ref 8–23)
CO2: 18 mmol/L — ABNORMAL LOW (ref 22–32)
Calcium: 8.5 mg/dL — ABNORMAL LOW (ref 8.9–10.3)
Chloride: 100 mmol/L (ref 98–111)
Creatinine, Ser: 4.31 mg/dL — ABNORMAL HIGH (ref 0.44–1.00)
GFR, Estimated: 11 mL/min — ABNORMAL LOW (ref >60)
Glucose, Bld: 188 mg/dL — ABNORMAL HIGH (ref 70–99)
Potassium: 4.2 mmol/L (ref 3.5–5.1)
Sodium: 127 mmol/L — ABNORMAL LOW (ref 135–145)
Total Bilirubin: 0.4 mg/dL (ref 0.0–1.2)
Total Protein: 5.8 g/dL — ABNORMAL LOW (ref 6.5–8.1)

## 2024-08-03 LAB — PROTIME-INR
INR: 1.4 — ABNORMAL HIGH (ref 0.8–1.2)
Prothrombin Time: 18.3 s — ABNORMAL HIGH (ref 11.4–15.2)

## 2024-08-03 LAB — APTT: aPTT: 44 s — ABNORMAL HIGH (ref 24–36)

## 2024-08-03 NOTE — ED Triage Notes (Signed)
 Pt reports ongoing left hand pain after having vascular surgery on left arm last month. States she is worried about circulation to her hand. Cap refill is greater than 3 seconds. Sensation and movement is intact.

## 2024-08-03 NOTE — ED Provider Triage Note (Signed)
 Emergency Medicine Provider Triage Evaluation Note  Alejandra Hernandez , a 68 y.o. female  was evaluated in triage.  Pt complains of left upper extremity pain/swelling.  Patient underwent AV fistula creation of her left upper extremity on 8/26, patient reports worsening swelling in the dorsal aspect of her hand as well as pain in the tips of all of her fingers of the left hand, as well as some discoloration to her left upper arm.  History of blood clots, on Xarelto .  Has not started dialysis yet.  Review of Systems  Positive: As above Negative: As above  Physical Exam  BP (!) 197/76   Pulse 85   Temp (!) 97.4 F (36.3 C)   Resp 17   Ht 5' 3 (1.6 m)   Wt 55.8 kg   LMP  (LMP Unknown)   SpO2 97%   BMI 21.79 kg/m  Gen:   Awake, no distress   Resp:  Normal effort  MSK:   Moves extremities without difficulty  Other:  Left upper extremity: Pain to palpation of distal aspects of all fingers, temperature feels similar in left hand as compared to right, 2+ radial pulse, unable to assess capillary refill as patient has fake nails on, scar over site of new AV fistula, there is induration and discoloration of the skin superior to the site of the new AV fistula  Medical Decision Making  Medically screening exam initiated at 3:44 PM.  Appropriate orders placed.  EILY LOUVIER was informed that the remainder of the evaluation will be completed by another provider, this initial triage assessment does not replace that evaluation, and the importance of remaining in the ED until their evaluation is complete.     Glendia Rocky SAILOR, [place] 08/03/24 [id number]

## 2024-08-03 NOTE — ED Notes (Signed)
Pt called for vitals x2 with no response 

## 2024-08-03 NOTE — Telephone Encounter (Signed)
  The patient had a left AVG created on 07/19/24.  The patient called and said she is experiencing numbness and extreme pain in her left hand. Stated if feels like frost bite.    The patient was advised to go to the ER to have her condition evaluated.  The patient verbalized understanding of the advice and said she would go to the ER.

## 2024-08-04 ENCOUNTER — Ambulatory Visit: Attending: Vascular Surgery | Admitting: Vascular Surgery

## 2024-08-04 ENCOUNTER — Encounter (HOSPITAL_COMMUNITY)

## 2024-08-04 ENCOUNTER — Encounter (HOSPITAL_COMMUNITY): Payer: Self-pay | Admitting: Vascular Surgery

## 2024-08-04 ENCOUNTER — Encounter: Payer: Self-pay | Admitting: Vascular Surgery

## 2024-08-04 ENCOUNTER — Other Ambulatory Visit: Payer: Self-pay

## 2024-08-04 ENCOUNTER — Telehealth: Payer: Self-pay

## 2024-08-04 VITALS — BP 185/74 | HR 75 | Temp 98.4°F | Ht 63.0 in | Wt 131.0 lb

## 2024-08-04 DIAGNOSIS — T82898A Other specified complication of vascular prosthetic devices, implants and grafts, initial encounter: Secondary | ICD-10-CM
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
## 2024-08-04 NOTE — Telephone Encounter (Signed)
 This patient called yesterday 08/03/2024 with severe hand pain (she said feels like frost bite) and was advised to go to ER. She did go and the study below was done. However, the patient left without further treatment. She is calling again today stating the that the pain is worse and is refusing to go back to the ER.   We gave her an appointment for today at 3pm.  The patient agreed to appointment date and time.

## 2024-08-04 NOTE — Progress Notes (Signed)
 SDW call  Patient was given pre-op  instructions over the phone. Patient verbalized understanding of instructions provided.     PCP - Dr. Glo Blackwater Cardiologist - Dr. Wilbert Bihari [facility] Cardiologist: Dr. Deatrice Cage Pulmonary:    PPM/ICD - denies Device Orders - na Rep Notified - na   Chest x-ray - na EKG -  07/12/2024 Stress Test -  ECHO - 10/13/2023 Cardiac Cath - 08/02/2021  Sleep Study/sleep apnea/CPAP: denies  Type II diabetic.  A1C 7.4 on 03/29/2024. CGM Freestyle Libre to right arm Fasting Blood sugar range: 70-100 How often check sugars: continuous Tresiba , instructed to use 5-7 units the morning of surgery which is 50% of her regular dose Humalog , instructed 0 units the morning of surgery. May use 1/2 sliding scale dose for blood sugar > 220   Blood Thinner Instructions: Xarelto , states last dose 08/03/2024 Aspirin  Instructions:denies   ERAS Protcol - NPO   Anesthesia review: Yes. ESRD, CAD, HTN, MI, PVD, DM, DVT on xarelto , HLD   Patient denies shortness of breath, fever, cough and chest pain over the phone call  Your procedure is scheduled on Friday August 05, 2024  Report to [facility] Main Entrance A at  1155  A.M., then check in with the Admitting office.  Call this number if you have problems the morning of surgery:  [phone]   If you have any questions prior to your surgery date call [phone]: Open Monday-Friday 8am-4pm If you experience any cold or flu symptoms such as cough, fever, chills, shortness of breath, etc. between now and your scheduled surgery, please notify us  at the above number    Remember:  Do not eat or drink  after midnight the night before your surgery  Take these medicines the morning of surgery with A SIP OF WATER:  Amlodipine , clonidine , gabapentin , isosorbide , protonix   As needed: Tylenol , albuterol , eye drops, percocet  As of today, STOP taking any Aspirin  (unless otherwise instructed by your surgeon) Aleve,  Naproxen, Ibuprofen, Motrin, Advil, Goody's, BC's, all herbal medications, fish oil, and all vitamins.
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
## 2024-08-04 NOTE — Anesthesia Preprocedure Evaluation (Signed)
 Anesthesia Evaluation  Patient identified by MRN, date of birth, ID band Patient awake    Reviewed: Allergy & Precautions, NPO status , Patient's Chart, lab work & pertinent test results  Airway Mallampati: II  TM Distance: >3 FB Neck ROM: Full    Dental  (+) Edentulous Upper, Edentulous Lower   Pulmonary COPD, Current Smoker and Patient abstained from smoking.   Pulmonary exam normal breath sounds clear to auscultation       Cardiovascular hypertension, Pt. on medications and Pt. on home beta blockers + CAD, + Past MI, + Cardiac Stents, + Peripheral Vascular Disease, +CHF and + DVT  Normal cardiovascular exam Rhythm:Regular Rate:Normal  TTE 10/13/2023: 1. 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 Left ventricular ejection fraction, by  estimation, is 50 to 55%. Left ventricular ejection fraction by 3D volume  is 53 %. The left ventricle has low  normal function. The left ventricle demonstrates regional wall motion  abnormalities (see scoring diagram/findings for description). There is  moderate concentric left ventricular hypertrophy. Left ventricular  diastolic parameters are consistent with Grade   I diastolic dysfunction (impaired relaxation). Elevated left atrial  pressure. The average left ventricular global longitudinal strain is -16.8  %. The global longitudinal strain is abnormal.   2. Right ventricular systolic function is normal. The right ventricular  size is normal. There is normal pulmonary artery systolic pressure. The  estimated right ventricular systolic pressure is 33.0 mmHg.   3. Left atrial size was moderately dilated.   4. The mitral valve is normal in structure. Trivial mitral valve  regurgitation.   5. The aortic valve is tricuspid. Aortic valve regurgitation is not  visualized. Aortic valve sclerosis is present, with no evidence of aortic  valve  stenosis.   6. The inferior vena cava is dilated in size with >50% respiratory  variability, suggesting right atrial pressure of 8 mmHg.     Neuro/Psych  Headaches PSYCHIATRIC DISORDERS Anxiety Depression       GI/Hepatic Neg liver ROS, PUD,GERD  Medicated,,  Endo/Other  diabetes, Type 2, Insulin  Dependent    Renal/GU ESRFRenal disease  negative genitourinary   Musculoskeletal  (+) Arthritis , Osteoarthritis,    Abdominal   Peds  Hematology  (+) Blood dyscrasia, anemia Lab Results      Component                Value               Date                      WBC                      8.1                 04/21/2023                HGB                      12.9                07/19/2024                HCT                      38.0  07/19/2024                MCV                      73 (L)              04/21/2023                PLT                      343                 04/21/2023             Lab Results      Component                Value               Date                      NA                       121 (L)             07/19/2024                K                        3.6                 07/19/2024                CO2                      19 (L)              04/21/2023                GLUCOSE                  115 (H)             07/19/2024                BUN                      40 (H)              07/19/2024                CREATININE               4.00 (H)            07/19/2024                CALCIUM                   8.7                 04/21/2023                GFR                      33.67 (L)           02/25/2022                EGFR  28 (L)              04/21/2023                GFRNONAA                 34 (L)              08/03/2021              Anesthesia Other Findings   Reproductive/Obstetrics                              Anesthesia Physical Anesthesia Plan  ASA: 3  Anesthesia Plan: General   Post-op  Pain Management: Tylenol  PO (pre-op )*   Induction: Intravenous  PONV Risk Score and Plan: 1 and Ondansetron , Dexamethasone, Midazolam  and Treatment may vary due to age or medical condition  Airway Management Planned: Oral ETT  Additional Equipment: ClearSight  Intra-op Plan:   Post-operative Plan: Extubation in OR  Informed Consent: I have reviewed the patients History and Physical, chart, labs and discussed the procedure including the risks, benefits and alternatives for the proposed anesthesia with the patient or authorized representative who has indicated his/her understanding and acceptance.     Dental advisory given  Plan Discussed with: CRNA  Anesthesia Plan Comments: (PAT note written 08/04/2024 by Allison Zelenak, PA-C.  )         Anesthesia Quick Evaluation

## 2024-08-05 ENCOUNTER — Encounter (HOSPITAL_COMMUNITY)
Admission: RE | Disposition: A | Discharge status: Home / Self Care | Payer: Self-pay | Source: Home / Self Care | Attending: Vascular Surgery

## 2024-08-05 ENCOUNTER — Other Ambulatory Visit: Payer: Self-pay

## 2024-08-05 ENCOUNTER — Encounter (HOSPITAL_COMMUNITY): Payer: Self-pay | Admitting: Vascular Surgery

## 2024-08-05 ENCOUNTER — Ambulatory Visit (HOSPITAL_COMMUNITY)
Admission: RE | Admit: 2024-08-05 | Discharge: 2024-08-05 | Disposition: A | Discharge status: Home / Self Care | Attending: Vascular Surgery | Admitting: Vascular Surgery

## 2024-08-05 ENCOUNTER — Ambulatory Visit (HOSPITAL_COMMUNITY): Admitting: Vascular Surgery

## 2024-08-05 ENCOUNTER — Encounter (HOSPITAL_COMMUNITY): Admitting: Vascular Surgery

## 2024-08-05 DIAGNOSIS — Z794 Long term (current) use of insulin: Secondary | ICD-10-CM | POA: Insufficient documentation

## 2024-08-05 DIAGNOSIS — T82898A Other specified complication of vascular prosthetic devices, implants and grafts, initial encounter: Secondary | ICD-10-CM

## 2024-08-05 DIAGNOSIS — D649 Anemia, unspecified: Secondary | ICD-10-CM | POA: Diagnosis not present

## 2024-08-05 DIAGNOSIS — Z7901 Long term (current) use of anticoagulants: Secondary | ICD-10-CM | POA: Diagnosis not present

## 2024-08-05 DIAGNOSIS — F17210 Nicotine dependence, cigarettes, uncomplicated: Secondary | ICD-10-CM

## 2024-08-05 DIAGNOSIS — F32A Depression, unspecified: Secondary | ICD-10-CM | POA: Insufficient documentation

## 2024-08-05 DIAGNOSIS — F419 Anxiety disorder, unspecified: Secondary | ICD-10-CM | POA: Insufficient documentation

## 2024-08-05 DIAGNOSIS — E1151 Type 2 diabetes mellitus with diabetic peripheral angiopathy without gangrene: Secondary | ICD-10-CM | POA: Diagnosis not present

## 2024-08-05 DIAGNOSIS — I252 Old myocardial infarction: Secondary | ICD-10-CM | POA: Diagnosis not present

## 2024-08-05 DIAGNOSIS — I132 Hypertensive heart and chronic kidney disease with heart failure and with stage 5 chronic kidney disease, or end stage renal disease: Secondary | ICD-10-CM | POA: Insufficient documentation

## 2024-08-05 DIAGNOSIS — Z86718 Personal history of other venous thrombosis and embolism: Secondary | ICD-10-CM | POA: Insufficient documentation

## 2024-08-05 DIAGNOSIS — I5032 Chronic diastolic (congestive) heart failure: Secondary | ICD-10-CM

## 2024-08-05 DIAGNOSIS — Z79899 Other long term (current) drug therapy: Secondary | ICD-10-CM | POA: Diagnosis not present

## 2024-08-05 DIAGNOSIS — I2510 Atherosclerotic heart disease of native coronary artery without angina pectoris: Secondary | ICD-10-CM | POA: Insufficient documentation

## 2024-08-05 DIAGNOSIS — E1142 Type 2 diabetes mellitus with diabetic polyneuropathy: Secondary | ICD-10-CM | POA: Insufficient documentation

## 2024-08-05 DIAGNOSIS — G6281 Critical illness polyneuropathy: Secondary | ICD-10-CM | POA: Diagnosis not present

## 2024-08-05 DIAGNOSIS — E785 Hyperlipidemia, unspecified: Secondary | ICD-10-CM | POA: Insufficient documentation

## 2024-08-05 DIAGNOSIS — E1122 Type 2 diabetes mellitus with diabetic chronic kidney disease: Secondary | ICD-10-CM | POA: Insufficient documentation

## 2024-08-05 DIAGNOSIS — N186 End stage renal disease: Secondary | ICD-10-CM | POA: Insufficient documentation

## 2024-08-05 DIAGNOSIS — N185 Chronic kidney disease, stage 5: Secondary | ICD-10-CM

## 2024-08-05 DIAGNOSIS — R200 Anesthesia of skin: Secondary | ICD-10-CM | POA: Diagnosis not present

## 2024-08-05 DIAGNOSIS — K219 Gastro-esophageal reflux disease without esophagitis: Secondary | ICD-10-CM | POA: Diagnosis not present

## 2024-08-05 DIAGNOSIS — J449 Chronic obstructive pulmonary disease, unspecified: Secondary | ICD-10-CM | POA: Insufficient documentation

## 2024-08-05 DIAGNOSIS — I5030 Unspecified diastolic (congestive) heart failure: Secondary | ICD-10-CM | POA: Insufficient documentation

## 2024-08-05 DIAGNOSIS — R202 Paresthesia of skin: Secondary | ICD-10-CM | POA: Diagnosis not present

## 2024-08-05 HISTORY — PX: PATCH ANGIOPLASTY: SHX6230

## 2024-08-05 HISTORY — PX: REMOVAL OF GRAFT: SHX6361

## 2024-08-05 LAB — BASIC METABOLIC PANEL WITH GFR
Anion gap: 10 (ref 5–15)
BUN: 49 mg/dL — ABNORMAL HIGH (ref 8–23)
CO2: 17 mmol/L — ABNORMAL LOW (ref 22–32)
Calcium: 8.4 mg/dL — ABNORMAL LOW (ref 8.9–10.3)
Chloride: 106 mmol/L (ref 98–111)
Creatinine, Ser: 4.36 mg/dL — ABNORMAL HIGH (ref 0.44–1.00)
GFR, Estimated: 11 mL/min — ABNORMAL LOW (ref >60)
Glucose, Bld: 154 mg/dL — ABNORMAL HIGH (ref 70–99)
Potassium: 4.3 mmol/L (ref 3.5–5.1)
Sodium: 133 mmol/L — ABNORMAL LOW (ref 135–145)

## 2024-08-05 LAB — POCT I-STAT, CHEM 8
BUN: 74 mg/dL — ABNORMAL HIGH (ref 8–23)
Calcium, Ion: 1.09 mmol/L — ABNORMAL LOW (ref 1.15–1.40)
Chloride: 108 mmol/L (ref 98–111)
Creatinine, Ser: 4.6 mg/dL — ABNORMAL HIGH (ref 0.44–1.00)
Glucose, Bld: 155 mg/dL — ABNORMAL HIGH (ref 70–99)
HCT: 33 % — ABNORMAL LOW (ref 36.0–46.0)
Hemoglobin: 11.2 g/dL — ABNORMAL LOW (ref 12.0–15.0)
Potassium: 6.6 mmol/L (ref 3.5–5.1)
Sodium: 133 mmol/L — ABNORMAL LOW (ref 135–145)
TCO2: 21 mmol/L — ABNORMAL LOW (ref 22–32)

## 2024-08-05 LAB — GLUCOSE, CAPILLARY
Glucose-Capillary: 145 mg/dL — ABNORMAL HIGH (ref 70–99)
Glucose-Capillary: 148 mg/dL — ABNORMAL HIGH (ref 70–99)
Glucose-Capillary: 193 mg/dL — ABNORMAL HIGH (ref 70–99)

## 2024-08-05 SURGERY — REMOVAL, GRAFT
Anesthesia: General | Laterality: Left

## 2024-08-05 MED ORDER — SODIUM CHLORIDE 0.9 % IV SOLN
INTRAVENOUS | Status: DC
Start: 2024-08-05 — End: 2024-08-05

## 2024-08-05 MED ORDER — PROPOFOL 10 MG/ML IV BOLUS
INTRAVENOUS | Status: DC | PRN
Start: 2024-08-05 — End: 2024-08-05
  Administered 2024-08-05: 90 mg via INTRAVENOUS
  Administered 2024-08-05: 50 mg via INTRAVENOUS

## 2024-08-05 MED ORDER — SUGAMMADEX SODIUM 200 MG/2ML IV SOLN
INTRAVENOUS | Status: DC | PRN
  Administered 2024-08-05: 200 mg via INTRAVENOUS

## 2024-08-05 MED ORDER — ORAL CARE MOUTH RINSE: 15.0000 mL | Freq: Once | OROMUCOSAL | Status: AC

## 2024-08-05 MED ORDER — CHLORHEXIDINE GLUCONATE 4 % EX SOLN
60.0000 mL | Freq: Once | CUTANEOUS | Status: DC
Start: 2024-08-05 — End: 2024-08-05

## 2024-08-05 MED ORDER — FENTANYL CITRATE (PF) 250 MCG/5ML IJ SOLN
INTRAMUSCULAR | Status: AC
  Filled 2024-08-05: qty 5

## 2024-08-05 MED ORDER — INSULIN ASPART 100 UNIT/ML IJ SOLN: 0.0000 [IU] | INTRAMUSCULAR | Status: DC | PRN

## 2024-08-05 MED ORDER — CLEVIDIPINE BUTYRATE 0.5 MG/ML IV EMUL
INTRAVENOUS | Status: AC
  Filled 2024-08-05: qty 50

## 2024-08-05 MED ORDER — PROTAMINE SULFATE 10 MG/ML IV SOLN
INTRAVENOUS | Status: DC | PRN
  Administered 2024-08-05: 30 mg via INTRAVENOUS

## 2024-08-05 MED ORDER — 0.9 % SODIUM CHLORIDE (POUR BTL) OPTIME
TOPICAL | Status: DC | PRN
  Administered 2024-08-05: 1000 mL

## 2024-08-05 MED ORDER — CHLORHEXIDINE GLUCONATE 4 % EX SOLN: 60.0000 mL | Freq: Once | CUTANEOUS | Status: DC

## 2024-08-05 MED ORDER — CHLORHEXIDINE GLUCONATE 0.12 % MT SOLN
15.0000 mL | Freq: Once | OROMUCOSAL | Status: AC
  Administered 2024-08-05: 15 mL via OROMUCOSAL

## 2024-08-05 MED ORDER — HEPARIN 6000 UNIT IRRIGATION SOLUTION
Status: AC
  Filled 2024-08-05: qty 500

## 2024-08-05 MED ORDER — ROCURONIUM BROMIDE 10 MG/ML (PF) SYRINGE
PREFILLED_SYRINGE | INTRAVENOUS | Status: DC | PRN
  Administered 2024-08-05: 50 mg via INTRAVENOUS

## 2024-08-05 MED ORDER — FENTANYL CITRATE (PF) 250 MCG/5ML IJ SOLN
INTRAMUSCULAR | Status: DC | PRN
  Administered 2024-08-05: 50 ug via INTRAVENOUS
  Administered 2024-08-05: 100 ug via INTRAVENOUS
  Administered 2024-08-05 (×2): 50 ug via INTRAVENOUS

## 2024-08-05 MED ORDER — HEPARIN 6000 UNIT IRRIGATION SOLUTION
Status: DC | PRN
  Administered 2024-08-05: 1

## 2024-08-05 MED ORDER — HEPARIN SODIUM (PORCINE) 1000 UNIT/ML IJ SOLN
INTRAMUSCULAR | Status: DC | PRN
  Administered 2024-08-05: 5000 [IU] via INTRAVENOUS

## 2024-08-05 MED ORDER — PHENYLEPHRINE 80 MCG/ML (10ML) SYRINGE FOR IV PUSH (FOR BLOOD PRESSURE SUPPORT)
PREFILLED_SYRINGE | INTRAVENOUS | Status: DC | PRN
  Administered 2024-08-05: 80 ug via INTRAVENOUS

## 2024-08-05 MED ORDER — SODIUM CHLORIDE 0.9 % IV SOLN: INTRAVENOUS | Status: DC

## 2024-08-05 MED ORDER — LIDOCAINE 2% (20 MG/ML) 5 ML SYRINGE
INTRAMUSCULAR | Status: DC | PRN
  Administered 2024-08-05: 50 mg via INTRAVENOUS

## 2024-08-05 MED ORDER — SODIUM CHLORIDE 0.9 % IV SOLN
20.0000 ug | Freq: Once | INTRAVENOUS | Status: AC
  Administered 2024-08-05: 20 ug via INTRAVENOUS
  Filled 2024-08-05: qty 5

## 2024-08-05 MED ORDER — CEFAZOLIN SODIUM-DEXTROSE 2-4 GM/100ML-% IV SOLN
2.0000 g | INTRAVENOUS | Status: AC
Start: 2024-08-05 — End: 2024-08-05
  Administered 2024-08-05: 2 g via INTRAVENOUS
  Filled 2024-08-05: qty 100

## 2024-08-05 MED ORDER — SURGIFLO WITH THROMBIN (HEMOSTATIC MATRIX KIT) OPTIME
TOPICAL | Status: DC | PRN
  Administered 2024-08-05: 1 via TOPICAL

## 2024-08-05 MED ORDER — LABETALOL HCL 5 MG/ML IV SOLN
INTRAVENOUS | Status: DC | PRN
  Administered 2024-08-05 (×4): 5 mg via INTRAVENOUS

## 2024-08-05 MED ORDER — ONDANSETRON HCL 4 MG/2ML IJ SOLN
INTRAMUSCULAR | Status: DC | PRN
  Administered 2024-08-05: 4 mg via INTRAVENOUS

## 2024-08-05 MED ORDER — HEMOSTATIC AGENTS (NO CHARGE) OPTIME
TOPICAL | Status: DC | PRN
  Administered 2024-08-05 (×4): 1 via TOPICAL

## 2024-08-05 SURGICAL SUPPLY — 58 items
BANDAGE ESMARK 6X9 LF (GAUZE/BANDAGES/DRESSINGS) IMPLANT
BNDG COMPR ESMARK 4X3 LF (GAUZE/BANDAGES/DRESSINGS) IMPLANT
BNDG ELASTIC 4INX 5YD STR LF (GAUZE/BANDAGES/DRESSINGS) IMPLANT
BNDG ELASTIC 6INX 5YD STR LF (GAUZE/BANDAGES/DRESSINGS) IMPLANT
BNDG GAUZE DERMACEA FLUFF 4 (GAUZE/BANDAGES/DRESSINGS) IMPLANT
CANISTER SUCT 1200ML W/VALVE (MISCELLANEOUS) ×2 IMPLANT
CANNULA VESSEL 3MM 2 BLNT TIP (CANNULA) IMPLANT
CLIP TI MEDIUM 24 (CLIP) ×2 IMPLANT
CLIP TI MEDIUM 6 (CLIP) ×2 IMPLANT
CLIP TI WIDE RED SMALL 24 (CLIP) ×2 IMPLANT
CLIP TI WIDE RED SMALL 6 (CLIP) ×2 IMPLANT
CUFF TOURN SGL QUICK 18X4 (TOURNIQUET CUFF) IMPLANT
CUFF TOURN SGL QUICK 42 (TOURNIQUET CUFF) IMPLANT
CUFF TRNQT CYL 24X4X16.5-23 (TOURNIQUET CUFF) IMPLANT
CUFF TRNQT CYL 34X4.125X (TOURNIQUET CUFF) IMPLANT
DERMABOND ADVANCED .7 DNX12 (GAUZE/BANDAGES/DRESSINGS) ×2 IMPLANT
DRAPE X-RAY CASS 24X20 (DRAPES) IMPLANT
ELECTRODE REM PT RTRN 9FT ADLT (ELECTROSURGICAL) ×2 IMPLANT
EVACUATOR SILICONE 100CC (DRAIN) IMPLANT
GAUZE SPONGE 4X4 12PLY STRL (GAUZE/BANDAGES/DRESSINGS) IMPLANT
GLOVE BIOGEL PI IND STRL 7.5 (GLOVE) ×2 IMPLANT
GLOVE BIOGEL PI IND STRL 8 (GLOVE) ×2 IMPLANT
GLOVE SURG SS PI 7.5 STRL IVOR (GLOVE) ×2 IMPLANT
GLOVE SURG SS PI 8.0 STRL IVOR (GLOVE) IMPLANT
GOWN STRL REUS W/ TWL LRG LVL3 (GOWN DISPOSABLE) ×4 IMPLANT
GOWN STRL REUS W/TWL 2XL LVL3 (GOWN DISPOSABLE) ×4 IMPLANT
HEMOSTAT SNOW SURGICEL 2X4 (HEMOSTASIS) IMPLANT
INSERT FOGARTY SM (MISCELLANEOUS) IMPLANT
KIT BASIN OR (CUSTOM PROCEDURE TRAY) ×2 IMPLANT
KIT TURNOVER KIT B (KITS) ×2 IMPLANT
MARKER GRAFT CORONARY BYPASS (MISCELLANEOUS) IMPLANT
NS IRRIG 1000ML POUR BTL (IV SOLUTION) ×4 IMPLANT
PACK CV ACCESS (CUSTOM PROCEDURE TRAY) IMPLANT
PACK PERIPHERAL VASCULAR (CUSTOM PROCEDURE TRAY) ×2 IMPLANT
PAD ARMBOARD POSITIONER FOAM (MISCELLANEOUS) ×4 IMPLANT
SET COLLECT BLD 21X3/4 12 (NEEDLE) IMPLANT
SHEET MEDIUM DRAPE 40X70 STRL (DRAPES) IMPLANT
SLING ARM FOAM STRAP MED (SOFTGOODS) IMPLANT
SPONGE T-LAP 18X18 ~~LOC~~+RFID (SPONGE) IMPLANT
STAPLER SKIN PROX 35W (STAPLE) IMPLANT
STOPCOCK 4 WAY LG BORE MALE ST (IV SETS) IMPLANT
SURGIFLO W/THROMBIN 8M KIT (HEMOSTASIS) IMPLANT
SUT ETHILON 3 0 PS 1 (SUTURE) IMPLANT
SUT GORETEX 6.0 TT9 (SUTURE) IMPLANT
SUT PROLENE 5 0 C 1 24 (SUTURE) ×2 IMPLANT
SUT PROLENE 6 0 BV (SUTURE) ×2 IMPLANT
SUT PROLENE 7 0 BV 1 (SUTURE) IMPLANT
SUT SILK 2 0 SH (SUTURE) ×2 IMPLANT
SUT SILK 3-0 18XBRD TIE 12 (SUTURE) IMPLANT
SUT VIC AB 2-0 CT1 TAPERPNT 27 (SUTURE) ×4 IMPLANT
SUT VIC AB 3-0 SH 27X BRD (SUTURE) ×4 IMPLANT
SUT VIC AB 4-0 PS2 18 (SUTURE) ×4 IMPLANT
TAPE UMBILICAL 1/8X30 (MISCELLANEOUS) IMPLANT
TOWEL GREEN STERILE FF (TOWEL DISPOSABLE) ×2 IMPLANT
TRAY FOLEY MTR SLVR 16FR STAT (SET/KITS/TRAYS/PACK) ×2 IMPLANT
TUBING EXTENTION W/L.L. (IV SETS) IMPLANT
UNDERPAD 30X36 HEAVY ABSORB (UNDERPADS AND DIAPERS) ×2 IMPLANT
WATER STERILE IRR 1000ML POUR (IV SOLUTION) ×2 IMPLANT

## 2024-08-05 NOTE — Progress Notes (Signed)
 Dr. Darlyn made aware of BMP result. Potassium 4.3.

## 2024-08-05 NOTE — Discharge Instructions (Signed)
 Can take arm wrap down on Sunday

## 2024-08-05 NOTE — Transfer of Care (Signed)
 Immediate Anesthesia Transfer of Care Note  Patient: Alejandra Hernandez  Procedure(s) Performed: REMOVAL, LEFT ARM ARTERIOVENOUS GRAFT (Left) VEIN PATCH ANGIOPLASTY OF LEFT BRACHIAL ARTERY  Patient Location: PACU  Anesthesia Type:General  Level of Consciousness: drowsy and patient cooperative  Airway & Oxygen Therapy: Patient connected to face mask oxygen  Post-op Assessment: Report given to RN and Post -op Vital signs reviewed and stable  Post vital signs: Reviewed and stable  Last Vitals:  Vitals Value Taken Time  BP 178/77 08/05/24 16:33  Temp    Pulse 65 08/05/24 16:36  Resp 14 08/05/24 16:36  SpO2 98 % 08/05/24 16:36  Vitals shown include unfiled device data.  Last Pain:  Vitals:   08/05/24 1213  TempSrc:   PainSc: 8       Patients Stated Pain Goal: 2 (08/05/24 1213)  Complications: There were no known notable events for this encounter.

## 2024-08-05 NOTE — Progress Notes (Signed)
 Dr. Darlyn made aware that today's ISTAT= Potassium 6.6. Verbal order received to collect a BMP.

## 2024-08-05 NOTE — Op Note (Signed)
    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 female with history of stage V chronic kidney disease with recent left arm AV graft from the left brachial artery to the left brachial vein using 4 7 tapered PTFE.  She presented to my office yesterday with continued numbness at the fingertips and motor weakness.  This has been present since surgery.  After discussing the risks and benefits of left arm fistula excision to hopefully improve left upper extremity symptoms, [name] elected to proceed.  FINDINGS:   Patent fistula One of the paired brachial veins ligated.  Some of this vein was used for the left brachial artery patch plasty. Raw surface bleeding   TECHNIQUE:   Patient was brought to the OR laid in supine position.  General anesthesia was induced and the patient's prepped and draped in standard fashion.  The case began with reopening the 2 prior incisions.  The graft was appreciated anastomosed to the brachial vein, as well as the brachial artery.  The graft was clamped, and transected using scissors.  As I worked at the graft out of the tunnel, the brachial artery anastomosis was disrupted.  At this point, I elected to place a tourniquet.  Heparin  was administered.  The tourniquet was inflated, the graft was removed from the left brachial artery using an 11 blade.  I then moved to the venous anastomosis.  The vein was mobilized and ligated both proximally and distally to the section that carried the anastomosis.  This was ligated proximally distally using 2-0  silk.  An 11 blade was then used to remove the remainder of the PTFE and subsequent vein on the inferior side was used for the patch plasty.  I then moved to the left brachial artery.  Due to the fragile nature of the tissue having had a recent operation roughly 6 weeks ago, I chose not to get circumferential control since had a tourniquet up.  The vein was splayed, and sewn to the prior arteriotomy on the brachial artery using running 6-0 Prolene suture.  Next, the tourniquet was removed.  The patient had a palpable pulse at the level of the wrist.  There was raw surface bleeding from both incisions.  Protamine  was administered as well as DDAVP .  The artery was insonated both proximally and distally.  Once hemostasis was achieved, wound beds were closed in layers using Vicryl with staples at the level of the skin.  The arm was placed in compression and the patient was taken the PACU in stable condition.  Fonda FORBES Rim, MD [facility] DATE OF DICTATION:   08/05/2024
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
## 2024-08-05 NOTE — Progress Notes (Signed)
 Dr. Darlyn made aware that the patient reports taking Nitroglycerin  on Monday 08/01/24. Verbal order received to obtain an EKG. Patient denies any current chest pain or shortness of breath.

## 2024-08-05 NOTE — Anesthesia Procedure Notes (Addendum)
 Procedure Name: Intubation Date/Time: 08/05/2024 2:58 PM  Performed by: Christopher Comings, CRNAPre-anesthesia Checklist: Patient identified, Emergency Drugs available, Suction available and Patient being monitored Patient Re-evaluated:Patient Re-evaluated prior to induction Oxygen Delivery Method: Circle system utilized Preoxygenation: Pre-oxygenation with 100% oxygen Induction Type: IV induction Ventilation: Mask ventilation without difficulty and Oral airway inserted - appropriate to patient size Laryngoscope Size: Mac and 4 Grade View: Grade I Tube type: Oral Tube size: 7.0 mm Number of attempts: 1 Airway Equipment and Method: Stylet and Oral airway Placement Confirmation: ETT inserted through vocal cords under direct vision, positive ETCO2 and breath sounds checked- equal and bilateral Secured at: 22 cm Tube secured with: Tape Dental Injury: Teeth and Oropharynx as per pre-operative assessment

## 2024-08-08 ENCOUNTER — Encounter (HOSPITAL_COMMUNITY): Payer: Self-pay | Admitting: Vascular Surgery

## 2024-08-08 NOTE — Anesthesia Postprocedure Evaluation (Signed)
 Anesthesia Post Note  Patient: Alejandra Hernandez  Procedure(s) Performed: REMOVAL, LEFT ARM ARTERIOVENOUS GRAFT (Left) VEIN PATCH ANGIOPLASTY OF LEFT BRACHIAL ARTERY     Patient location during evaluation: PACU Anesthesia Type: General Level of consciousness: sedated and patient cooperative Pain management: pain level controlled Vital Signs Assessment: post-procedure vital signs reviewed and stable Respiratory status: spontaneous breathing Cardiovascular status: stable Anesthetic complications: no   There were no known notable events for this encounter.  Last Vitals:  Vitals:   08/05/24 1700 08/05/24 1704  BP:  (!) 164/67  Pulse:  60  Resp:  11  Temp:    SpO2: 92% 92%    Last Pain:  Vitals:   08/05/24 1704  TempSrc:   PainSc: 0-No pain                 Norleen Pope

## 2024-08-09 ENCOUNTER — Other Ambulatory Visit (HOSPITAL_COMMUNITY): Payer: Self-pay | Admitting: Nephrology

## 2024-08-09 ENCOUNTER — Telehealth: Payer: Self-pay

## 2024-08-09 DIAGNOSIS — N184 Chronic kidney disease, stage 4 (severe): Secondary | ICD-10-CM | POA: Insufficient documentation

## 2024-08-09 NOTE — Telephone Encounter (Signed)
 The patient called requesting antibiotic for sore throat attempted to return call, no answer. A VM was left.

## 2024-08-11 ENCOUNTER — Ambulatory Visit (HOSPITAL_COMMUNITY)
Admission: RE | Admit: 2024-08-11 | Discharge: 2024-08-11 | Disposition: A | Discharge status: Home / Self Care | Source: Ambulatory Visit | Attending: Nephrology | Admitting: Nephrology

## 2024-08-11 VITALS — BP 122/57 | HR 74 | Temp 97.0°F | Resp 16

## 2024-08-11 DIAGNOSIS — N184 Chronic kidney disease, stage 4 (severe): Secondary | ICD-10-CM | POA: Insufficient documentation

## 2024-08-11 DIAGNOSIS — D631 Anemia in chronic kidney disease: Secondary | ICD-10-CM | POA: Diagnosis present

## 2024-08-11 LAB — IRON AND TIBC
Iron: 24 ug/dL — ABNORMAL LOW (ref 28–170)
Saturation Ratios: 12 % (ref 10.4–31.8)
TIBC: 193 ug/dL — ABNORMAL LOW (ref 250–450)
UIBC: 169 ug/dL

## 2024-08-11 LAB — FERRITIN: Ferritin: 252 ng/mL (ref 11–307)

## 2024-08-11 MED ORDER — EPOETIN ALFA-EPBX 10000 UNIT/ML IJ SOLN
INTRAMUSCULAR | Status: AC
  Filled 2024-08-11: qty 2

## 2024-08-11 MED ORDER — EPOETIN ALFA-EPBX 10000 UNIT/ML IJ SOLN
15000.0000 [IU] | Freq: Once | INTRAMUSCULAR | Status: AC
  Administered 2024-08-11: 15000 [IU] via SUBCUTANEOUS

## 2024-08-16 LAB — POCT HEMOGLOBIN-HEMACUE: Hemoglobin: 9 g/dL — ABNORMAL LOW (ref 12.0–15.0)

## 2024-08-23 ENCOUNTER — Other Ambulatory Visit: Payer: Self-pay | Admitting: Internal Medicine

## 2024-08-23 DIAGNOSIS — Z1231 Encounter for screening mammogram for malignant neoplasm of breast: Secondary | ICD-10-CM

## 2024-08-25 ENCOUNTER — Ambulatory Visit: Attending: Vascular Surgery | Admitting: Physician Assistant

## 2024-08-25 ENCOUNTER — Encounter: Admitting: Vascular Surgery

## 2024-08-25 ENCOUNTER — Ambulatory Visit (HOSPITAL_COMMUNITY)
Admission: RE | Admit: 2024-08-25 | Discharge: 2024-08-25 | Disposition: A | Discharge status: Home / Self Care | Source: Ambulatory Visit | Attending: Nephrology | Admitting: Nephrology

## 2024-08-25 ENCOUNTER — Encounter: Payer: Self-pay | Admitting: Physician Assistant

## 2024-08-25 VITALS — BP 188/77 | HR 68 | Temp 98.4°F | Ht 63.0 in | Wt 130.0 lb

## 2024-08-25 VITALS — BP 165/68 | HR 73 | Temp 97.5°F | Resp 16

## 2024-08-25 DIAGNOSIS — N184 Chronic kidney disease, stage 4 (severe): Secondary | ICD-10-CM | POA: Insufficient documentation

## 2024-08-25 DIAGNOSIS — N189 Chronic kidney disease, unspecified: Secondary | ICD-10-CM | POA: Insufficient documentation

## 2024-08-25 DIAGNOSIS — D631 Anemia in chronic kidney disease: Secondary | ICD-10-CM | POA: Insufficient documentation

## 2024-08-25 DIAGNOSIS — N186 End stage renal disease: Secondary | ICD-10-CM

## 2024-08-25 DIAGNOSIS — T82898A Other specified complication of vascular prosthetic devices, implants and grafts, initial encounter: Secondary | ICD-10-CM

## 2024-08-25 LAB — POCT HEMOGLOBIN-HEMACUE: Hemoglobin: 8.9 g/dL — ABNORMAL LOW (ref 12.0–15.0)

## 2024-08-25 MED ORDER — EPOETIN ALFA-EPBX 3000 UNIT/ML IJ SOLN
INTRAMUSCULAR | Status: AC
  Filled 2024-08-25: qty 1

## 2024-08-25 MED ORDER — EPOETIN ALFA-EPBX 3000 UNIT/ML IJ SOLN
3000.0000 [IU] | Freq: Once | INTRAMUSCULAR | Status: AC
  Administered 2024-08-25: 3000 [IU] via SUBCUTANEOUS

## 2024-08-25 MED ORDER — EPOETIN ALFA-EPBX 10000 UNIT/ML IJ SOLN
INTRAMUSCULAR | Status: AC
  Filled 2024-08-25: qty 1

## 2024-08-25 MED ORDER — EPOETIN ALFA-EPBX 10000 UNIT/ML IJ SOLN
10000.0000 [IU] | Freq: Once | INTRAMUSCULAR | Status: AC
  Administered 2024-08-25: 10000 [IU] via SUBCUTANEOUS

## 2024-08-25 NOTE — Progress Notes (Signed)
 POST OPERATIVE OFFICE NOTE    CC:  F/u for surgery  HPI:  This is a 68 y.o. female who is s/p removal of left arm AV graft by Dr. Lanis on 08/05/2024.  This was performed due to steal syndrome in the left hand after AV graft placement on 07/19/2024.  She believes her grip strength has improved however still has numbness in her fingertips.  She is complaining mainly of pain around her incisions during today's visit.  She is still not yet on hemodialysis.  She has no interest in proceeding with right arm dialysis access surgery.  Allergies  Allergen Reactions   Lisinopril  Hives   Losartan  Other (See Comments)    Pt reports causes GI issues   Sodium Bicarbonate Other (See Comments)    Pt reports causes GI issues    Current Outpatient Medications  Medication Sig Dispense Refill   acetaminophen  (TYLENOL ) 500 MG tablet Take 500-1,000 mg by mouth every 6 (six) hours as needed (pain.).     albuterol  (PROAIR  HFA) 108 (90 Base) MCG/ACT inhaler Inhale 2 puffs into the lungs every 4 (four) hours as needed for wheezing or shortness of breath. 1 Inhaler 5   amitriptyline  (ELAVIL ) 25 MG tablet Take 75 mg by mouth at bedtime.      amLODipine  (NORVASC ) 2.5 MG tablet Take 1 tablet (2.5 mg total) by mouth daily. 90 tablet 1   Blood Glucose Monitoring Suppl (ACCU-CHEK GUIDE ME) w/Device KIT 4 (four) times daily. for testing     Calcium  Carbonate Antacid (TUMS PO) Take 2 tablets by mouth 3 (three) times daily as needed (acid indigestion).      cloNIDine  (CATAPRES ) 0.1 MG tablet Take 1 tablet (0.1 mg total) by mouth 2 (two) times daily. 180 tablet 2   Continuous Blood Gluc Receiver (FREESTYLE LIBRE 14 DAY READER) DEVI 1 each by Other route as needed (blood glucose).     ezetimibe  (ZETIA ) 10 MG tablet Take 1 tablet (10 mg total) by mouth every evening. 90 tablet 3   ferrous sulfate  325 (65 FE) MG tablet Take 650 mg by mouth in the morning.     furosemide  (LASIX ) 40 MG tablet Take 40 mg by mouth daily.      gabapentin  (NEURONTIN ) 600 MG tablet Take 0.5 tablets (300 mg total) by mouth 3 (three) times daily. (Patient taking differently: Take 600 mg by mouth 3 (three) times daily.) 90 tablet 2   Glucagon 3 MG/DOSE POWD Place 1 spray into the nose as needed (low blood sugar).     glucose blood (ACCU-CHEK AVIVA PLUS) test strip 1 each by Other route 2 (two) times daily. And lancets 2/day 200 each 3   hydroxypropyl methylcellulose / hypromellose (ISOPTO TEARS / GONIOVISC) 2.5 % ophthalmic solution Place 1 drop into both eyes 3 (three) times daily as needed (dry/irritated eyes.).     insulin  degludec (TRESIBA  FLEXTOUCH) 200 UNIT/ML FlexTouch Pen Inject 14 Units into the skin in the morning. (Patient taking differently: Inject 14 Units into the skin in the morning. 10 -14 units)     insulin  lispro (HUMALOG ) 100 UNIT/ML injection Inject 1-10 Units into the skin 3 (three) times daily before meals. Per sliding scale     isosorbide  mononitrate (IMDUR ) 120 MG 24 hr tablet TAKE 1 TABLET(120 MG) BY MOUTH DAILY (Patient taking differently: Take 120 mg by mouth daily.) 90 tablet 1   lidocaine  (LIDODERM ) 5 % PLACE 1 PATCH ONTO SKIN DAILY, REMOVE AND DISCARD WITHIN 12 HOURS OR AS DIRECTED (Patient  taking differently: Place 1 patch onto the skin daily as needed (pain.).) 90 patch 1   nitroGLYCERIN  (NITROSTAT ) 0.4 MG SL tablet PLACE 1 TABLET UNDER THE TONGUE EVERY 5 MINUTES AS NEEDED FOR CHEST PAIN (Patient taking differently: Place 0.4 mg under the tongue every 5 (five) minutes as needed for chest pain. PLACE 1 TABLET UNDER THE TONGUE EVERY 5 MINUTES AS NEEDED FOR CHEST PAIN) 25 tablet 7   NURTEC 75 MG TBDP Take 75 mg by mouth daily as needed (migraine).     oxyCODONE -acetaminophen  (PERCOCET) 10-325 MG tablet Take 1 tablet by mouth 4 (four) times daily as needed for pain.   0   pantoprazole  (PROTONIX ) 40 MG tablet TAKE 1 TABLET BY MOUTH TWICE DAILY BEFORE A MEAL (Patient taking differently: Take 40 mg by mouth 2 (two) times  daily before a meal.) 180 tablet 0   polyethylene glycol powder (MIRALAX ) 17 GM/SCOOP powder Take 17 g by mouth daily as needed for mild constipation.     REPATHA  SURECLICK 140 MG/ML SOAJ INJECT 1 PEN INTO THE SKIN EVERY 14 DAYS (Patient taking differently: Inject 140 mg into the skin every 14 (fourteen) days. INJECT 1 PEN INTO THE SKIN EVERY 14 DAYS) 6 mL 3   rosuvastatin  (CRESTOR ) 40 MG tablet TAKE 1 TABLET(40 MG) BY MOUTH AT BEDTIME (Patient taking differently: Take 40 mg by mouth daily.) 90 tablet 3   sodium bicarbonate 650 MG tablet Take 650 mg by mouth 2 (two) times daily.     Vitamin D , Ergocalciferol , (DRISDOL ) 50000 units CAPS capsule Take 50,000 Units by mouth every Wednesday.   10   XARELTO  15 MG TABS tablet TAKE 1 TABLET(15 MG) BY MOUTH EVERY EVENING (Patient taking differently: Take 15 mg by mouth every evening.) 90 tablet 1   carvedilol  (COREG ) 6.25 MG tablet Take 1 tablet (6.25 mg total) by mouth 2 (two) times daily with a meal. (Patient not taking: Reported on 08/25/2024) 180 tablet 3   cholecalciferol (VITAMIN D3) 25 MCG (1000 UNIT) tablet Take 1,000 Units by mouth in the morning. (Patient not taking: Reported on 08/25/2024)     diclofenac  Sodium (VOLTAREN ) 1 % GEL Apply 1 application. topically daily as needed (pain). Apply to feet for pain as needed (Patient not taking: Reported on 08/25/2024) 150 g 2   No current facility-administered medications for this visit.     ROS:  See HPI  Physical Exam:  Vitals:   08/25/24 1418  BP: (!) 188/77  Pulse: 68  Temp: 98.4 F (36.9 C)  TempSrc: Temporal  Weight: 130 lb (59 kg)  Height: 5' 3 (1.6 m)    Incision: Left axilla and arm incisions well-healed Extremities: 2+ palpable left radial pulse Neuro: A&O; numbness in fingertips of left hand  Assessment/Plan:  This is a 68 y.o. female who is s/p: Removal of left arm AV graft due to steal syndrome of the left hand  Left hand is well-perfused with a 2+ palpable left radial pulse.   Subjectively her grip strength has improved since removal of AV graft.  She however still has numbness in her fingers and fingertips.  Her incisions are well-healed and all staples were removed today.  Steri-Strips were applied.  She will be referred to occupational therapy to help with residual numbness and weakness.  She is not yet on hemodialysis and follows regularly with her nephrologist.  She has no interest currently in proceeding with right arm dialysis access.  She can follow-up on an as-needed basis.   Alejandra Hernandez  [facility] [phone]  Clinic MD:  Lanis

## 2024-08-31 ENCOUNTER — Ambulatory Visit
Admission: RE | Admit: 2024-08-31 | Discharge: 2024-08-31 | Disposition: A | Discharge status: Home / Self Care | Source: Ambulatory Visit | Attending: Internal Medicine | Admitting: Internal Medicine

## 2024-08-31 DIAGNOSIS — Z1231 Encounter for screening mammogram for malignant neoplasm of breast: Secondary | ICD-10-CM

## 2024-09-02 ENCOUNTER — Telehealth (HOSPITAL_COMMUNITY): Payer: Self-pay | Admitting: Nephrology

## 2024-09-02 ENCOUNTER — Telehealth: Payer: Self-pay

## 2024-09-02 ENCOUNTER — Other Ambulatory Visit: Payer: Self-pay | Admitting: Nurse Practitioner

## 2024-09-02 ENCOUNTER — Other Ambulatory Visit (HOSPITAL_COMMUNITY): Payer: Self-pay | Admitting: Nephrology

## 2024-09-02 DIAGNOSIS — D631 Anemia in chronic kidney disease: Secondary | ICD-10-CM | POA: Insufficient documentation

## 2024-09-02 DIAGNOSIS — E611 Iron deficiency: Secondary | ICD-10-CM | POA: Insufficient documentation

## 2024-09-02 NOTE — Telephone Encounter (Signed)
 Auth Submission: NO AUTH NEEDED Site of care: Site of care: [facility] Payer: [employer] dual complete Medication & CPT/J Code(s) submitted: Venofer (Iron Sucrose) J1756 Diagnosis Code:  Route of submission (phone, fax, portal): portal Phone # Fax # Auth type: Buy/Bill PB Units/visits requested: 300mg  x 2doses Reference number: [id number] Approval from: 09/02/24 to 11/23/24

## 2024-09-05 ENCOUNTER — Telehealth (HOSPITAL_COMMUNITY): Payer: Self-pay

## 2024-09-05 NOTE — Telephone Encounter (Signed)
 Auth Submission: NO AUTH NEEDED Site of care: Site of care: [facility] Payer: [employer] Dual Medication & CPT/J Code(s) submitted: Venofer (Iron Sucrose) J1756 Diagnosis Code: D63.1 Route of submission (phone, fax, portal):  Phone # Fax # Auth type: Buy/Bill HB Units/visits requested: 300mg  x 2 doses Reference number:  Approval from: 09/05/24 to 11/23/24

## 2024-09-08 ENCOUNTER — Encounter (HOSPITAL_COMMUNITY)
Admission: RE | Admit: 2024-09-08 | Discharge: 2024-09-08 | Disposition: A | Discharge status: Home / Self Care | Source: Ambulatory Visit | Attending: Nephrology | Admitting: Nephrology

## 2024-09-08 VITALS — BP 134/81 | HR 70 | Temp 97.7°F | Resp 16

## 2024-09-08 DIAGNOSIS — D631 Anemia in chronic kidney disease: Secondary | ICD-10-CM | POA: Insufficient documentation

## 2024-09-08 DIAGNOSIS — N184 Chronic kidney disease, stage 4 (severe): Secondary | ICD-10-CM | POA: Insufficient documentation

## 2024-09-08 LAB — IRON AND TIBC
Iron: 47 ug/dL (ref 28–170)
Saturation Ratios: 23 % (ref 10.4–31.8)
TIBC: 209 ug/dL — ABNORMAL LOW (ref 250–450)
UIBC: 162 ug/dL

## 2024-09-08 LAB — POCT HEMOGLOBIN-HEMACUE: Hemoglobin: 9.3 g/dL — ABNORMAL LOW (ref 12.0–15.0)

## 2024-09-08 LAB — FERRITIN: Ferritin: 175 ng/mL (ref 11–307)

## 2024-09-08 MED ORDER — EPOETIN ALFA-EPBX 3000 UNIT/ML IJ SOLN
3000.0000 [IU] | Freq: Once | INTRAMUSCULAR | Status: AC
  Administered 2024-09-08: 3000 [IU] via SUBCUTANEOUS

## 2024-09-08 MED ORDER — EPOETIN ALFA-EPBX 10000 UNIT/ML IJ SOLN
INTRAMUSCULAR | Status: AC
  Filled 2024-09-08: qty 1

## 2024-09-08 MED ORDER — EPOETIN ALFA-EPBX 3000 UNIT/ML IJ SOLN
INTRAMUSCULAR | Status: AC
  Filled 2024-09-08: qty 1

## 2024-09-08 MED ORDER — EPOETIN ALFA-EPBX 10000 UNIT/ML IJ SOLN
10000.0000 [IU] | Freq: Once | INTRAMUSCULAR | Status: AC
  Administered 2024-09-08: 10000 [IU] via SUBCUTANEOUS

## 2024-09-12 ENCOUNTER — Ambulatory Visit (INDEPENDENT_AMBULATORY_CARE_PROVIDER_SITE_OTHER): Admitting: Podiatry

## 2024-09-12 ENCOUNTER — Encounter: Payer: Self-pay | Admitting: Podiatry

## 2024-09-12 DIAGNOSIS — M79674 Pain in right toe(s): Secondary | ICD-10-CM

## 2024-09-12 DIAGNOSIS — M79675 Pain in left toe(s): Secondary | ICD-10-CM

## 2024-09-12 DIAGNOSIS — E1159 Type 2 diabetes mellitus with other circulatory complications: Secondary | ICD-10-CM

## 2024-09-12 DIAGNOSIS — B351 Tinea unguium: Secondary | ICD-10-CM | POA: Diagnosis not present

## 2024-09-12 DIAGNOSIS — I739 Peripheral vascular disease, unspecified: Secondary | ICD-10-CM | POA: Diagnosis not present

## 2024-09-12 NOTE — Progress Notes (Signed)
 Subjective: Alejandra Hernandez is a 68 y.o. female patient with history of diabetes who presents to office today complaining of long,mildly painful nails  while ambulating in shoes; unable to trim. Last A1c was 7.4.   PCP:  Norval Kettle, MD   .  No other pedal complaints noted.  Patient Active Problem List   Diagnosis Date Noted   Iron deficiency 09/02/2024   Anemia of chronic renal failure 09/02/2024   Anemia due to stage 4 chronic kidney disease treated with erythropoietin  (HCC) 08/09/2024   Chronic anticoagulation 08/03/2021   Nausea vomiting and diarrhea 05/19/2021   Dehydration 05/19/2021   Hyponatremia 05/18/2021   Hypomagnesemia 12/27/2020   Abnormal findings on diagnostic imaging of other specified body structures 08/27/2020   Disorder of mineral metabolism, unspecified 08/27/2020   Personal history of other venous thrombosis and embolism 08/27/2020   Pulmonary nodule 08/03/2020   Acidosis 07/06/2020   Complaints of total body pain 07/10/2019   Pain, unspecified 07/10/2019   Left upper extremity swelling 06/03/2019   COPD, 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   Leukocytosis 04/14/2019   Avascular necrosis of humeral head, left (HCC) 04/14/2019   Hypokalemia 04/14/2019   Neuropathy 04/14/2019   DKA (diabetic ketoacidosis) (HCC) 04/13/2019   Polyp of transverse colon    Atypical chest pain 11/16/2018   Diabetes mellitus, type II (HCC) 05/23/2018   CKD (chronic kidney disease), stage III (HCC) 05/14/2018   Unstable angina (HCC) 05/14/2018   Hyperglycemia 12/23/2017   Low TSH level 06/22/2017   CAD (coronary artery disease)    Nodule on liver 05/22/2017   Liver disease, unspecified 05/22/2017   Heart palpitations 05/14/2017   Unspecified skin changes 04/18/2017   Tobacco use 03/17/2017   Benign neoplasm of descending colon    Benign neoplasm of sigmoid colon    Chronic diastolic CHF (congestive heart failure) (HCC) 02/09/2017   History of DVT (deep vein thrombosis)    Abnormal CT  scan, chest    Acute urinary retention    Claudication 07/23/2016   PAD (peripheral artery disease)    Essential hypertension 05/14/2016   Uncontrolled type 2 diabetes mellitus with ketoacidosis without coma, with long-term current use of insulin  (HCC)    GERD (gastroesophageal reflux disease) 05/17/2014   Carotid artery disease 05/17/2014   Iron deficiency anemia 04/26/2014   Dyspnea 04/25/2014   Carotid artery bruit 04/25/2014   Hyperlipidemia    Gout    Current Outpatient Medications on File Prior to Visit  Medication Sig Dispense Refill   acetaminophen  (TYLENOL ) 500 MG tablet Take 500-1,000 mg by mouth every 6 (six) hours as needed (pain.).     albuterol  (PROAIR  HFA) 108 (90 Base) MCG/ACT inhaler Inhale 2 puffs into the lungs every 4 (four) hours as needed for wheezing or shortness of breath. 1 Inhaler 5   amitriptyline  (ELAVIL ) 25 MG tablet Take 75 mg by mouth at bedtime.      amLODipine  (NORVASC ) 2.5 MG tablet Take 1 tablet (2.5 mg total) by mouth daily. 90 tablet 1   Blood Glucose Monitoring Suppl (ACCU-CHEK GUIDE ME) w/Device KIT 4 (four) times daily. for testing     Calcium  Carbonate Antacid (TUMS PO) Take 2 tablets by mouth 3 (three) times daily as needed (acid indigestion).      carvedilol  (COREG ) 6.25 MG tablet Take 1 tablet (6.25 mg total) by mouth 2 (two) times daily with a meal. 180 tablet 3   cholecalciferol (VITAMIN D3) 25 MCG (1000 UNIT) tablet Take 1,000 Units by mouth in the  morning.     cloNIDine  (CATAPRES ) 0.1 MG tablet Take 1 tablet (0.1 mg total) by mouth 2 (two) times daily. 180 tablet 2   Continuous Blood Gluc Receiver (FREESTYLE LIBRE 14 DAY READER) DEVI 1 each by Other route as needed (blood glucose).     diclofenac  Sodium (VOLTAREN ) 1 % GEL Apply 1 application. topically daily as needed (pain). Apply to feet for pain as needed 150 g 2   ezetimibe  (ZETIA ) 10 MG tablet Take 1 tablet (10 mg total) by mouth every evening. 90 tablet 3   ferrous sulfate  325 (65 FE)  MG tablet Take 650 mg by mouth in the morning.     furosemide  (LASIX ) 40 MG tablet Take 40 mg by mouth daily.     gabapentin  (NEURONTIN ) 600 MG tablet Take 0.5 tablets (300 mg total) by mouth 3 (three) times daily. (Patient taking differently: Take 600 mg by mouth 3 (three) times daily.) 90 tablet 2   Glucagon 3 MG/DOSE POWD Place 1 spray into the nose as needed (low blood sugar).     glucose blood (ACCU-CHEK AVIVA PLUS) test strip 1 each by Other route 2 (two) times daily. And lancets 2/day 200 each 3   hydroxypropyl methylcellulose / hypromellose (ISOPTO TEARS / GONIOVISC) 2.5 % ophthalmic solution Place 1 drop into both eyes 3 (three) times daily as needed (dry/irritated eyes.).     insulin  degludec (TRESIBA  FLEXTOUCH) 200 UNIT/ML FlexTouch Pen Inject 14 Units into the skin in the morning. (Patient taking differently: Inject 14 Units into the skin in the morning. 10 -14 units)     insulin  lispro (HUMALOG ) 100 UNIT/ML injection Inject 1-10 Units into the skin 3 (three) times daily before meals. Per sliding scale     isosorbide  mononitrate (IMDUR ) 120 MG 24 hr tablet TAKE 1 TABLET(120 MG) BY MOUTH DAILY (Patient taking differently: Take 120 mg by mouth daily.) 90 tablet 1   lidocaine  (LIDODERM ) 5 % PLACE 1 PATCH ONTO SKIN DAILY, REMOVE AND DISCARD WITHIN 12 HOURS OR AS DIRECTED (Patient taking differently: Place 1 patch onto the skin daily as needed (pain.).) 90 patch 1   nitroGLYCERIN  (NITROSTAT ) 0.4 MG SL tablet PLACE 1 TABLET UNDER THE TONGUE EVERY 5 MINUTES AS NEEDED FOR CHEST PAIN (Patient taking differently: Place 0.4 mg under the tongue every 5 (five) minutes as needed for chest pain. PLACE 1 TABLET UNDER THE TONGUE EVERY 5 MINUTES AS NEEDED FOR CHEST PAIN) 25 tablet 7   NURTEC 75 MG TBDP Take 75 mg by mouth daily as needed (migraine).     oxyCODONE -acetaminophen  (PERCOCET) 10-325 MG tablet Take 1 tablet by mouth 4 (four) times daily as needed for pain.   0   pantoprazole  (PROTONIX ) 40 MG tablet  TAKE 1 TABLET BY MOUTH TWICE DAILY BEFORE A MEAL (Patient taking differently: Take 40 mg by mouth 2 (two) times daily before a meal.) 180 tablet 0   polyethylene glycol powder (MIRALAX ) 17 GM/SCOOP powder Take 17 g by mouth daily as needed for mild constipation.     REPATHA  SURECLICK 140 MG/ML SOAJ INJECT 1 PEN INTO THE SKIN EVERY 14 DAYS (Patient taking differently: Inject 140 mg into the skin every 14 (fourteen) days. INJECT 1 PEN INTO THE SKIN EVERY 14 DAYS) 6 mL 3   rosuvastatin  (CRESTOR ) 40 MG tablet TAKE 1 TABLET(40 MG) BY MOUTH AT BEDTIME (Patient taking differently: Take 40 mg by mouth daily.) 90 tablet 3   sodium bicarbonate 650 MG tablet Take 650 mg by mouth 2 (two)  times daily.     Vitamin D , Ergocalciferol , (DRISDOL ) 50000 units CAPS capsule Take 50,000 Units by mouth every Wednesday.   10   XARELTO  15 MG TABS tablet TAKE 1 TABLET(15 MG) BY MOUTH EVERY EVENING (Patient taking differently: Take 15 mg by mouth every evening.) 90 tablet 1   No current facility-administered medications on file prior to visit.   Allergies  Allergen Reactions   Lisinopril  Hives   Losartan  Other (See Comments)    Pt reports causes GI issues   Sodium Bicarbonate Other (See Comments)    Pt reports causes GI issues      Objective: General: Patient is awake, alert, and oriented x 3 and in no acute distress.  Integument: Skin is warm, dry and supple bilateral. Nails are tender, long, thickened and dystrophic with subungual debris, consistent with onychomycosis, 1-5 bilateral. No signs of infection. Mild callus to bilateral hallux, Blister noted to anterior leg.  remaining integument unremarkable.  Vasculature:  Dorsalis Pedis pulse 1/4 bilateral. Posterior Tibial pulse  1/4 bilateral. Capillary fill time <3 sec 1-5 bilateral. Positive hair growth to the level of the digits.Temperature gradient within normal limits.  1+ pitting edema bilateral ankles however easily displaced to be able to lightly palpate the  pulses.  Pitting edema is worse on the left as compared to the right.  Neurology: The patient has diminished sensation measured with a 5.07/10g Semmes Weinstein Monofilament at all pedal sites bilateral. Vibratory sensation diminished bilateral with tuning fork. No Babinski sign present bilateral.   Musculoskeletal: Asymptomatic hallux limitus, pes planus, hammertoe pedal deformities noted bilateral. Muscular strength 4/5 in all lower extremity muscular groups bilateral without pain on range of motion.  Pain with walking calf that goes away with rest concerning for claudication.  No tenderness with calf compression bilateral.  No acute signs of DVT.  Assessment and Plan: Problem List Items Addressed This Visit       Cardiovascular and Mediastinum   PAD (peripheral artery disease)     Endocrine   Diabetes mellitus, type II (HCC)   Other Visit Diagnoses       Pain due to onychomycosis of toenails of both feet    -  Primary         -Examined patient. -Re-Discussed and educated patient on diabetic foot care  -Mechanically debrided all nails 1-5 bilateral using sterile nail nipper and filed with dremel without incident  -Patient to return  in 3 months for at risk foot nail care and callus care -Patient advised to call the office if any problems or questions arise in the meantime.  Asberry Failing, DPM
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
## 2024-09-15 ENCOUNTER — Ambulatory Visit: Attending: Vascular Surgery

## 2024-09-15 ENCOUNTER — Other Ambulatory Visit (HOSPITAL_COMMUNITY): Payer: Self-pay | Admitting: Nephrology

## 2024-09-15 DIAGNOSIS — R278 Other lack of coordination: Secondary | ICD-10-CM | POA: Insufficient documentation

## 2024-09-15 DIAGNOSIS — M6281 Muscle weakness (generalized): Secondary | ICD-10-CM | POA: Insufficient documentation

## 2024-09-15 DIAGNOSIS — R208 Other disturbances of skin sensation: Secondary | ICD-10-CM | POA: Insufficient documentation

## 2024-09-15 DIAGNOSIS — M79642 Pain in left hand: Secondary | ICD-10-CM | POA: Diagnosis present

## 2024-09-15 DIAGNOSIS — M79602 Pain in left arm: Secondary | ICD-10-CM | POA: Insufficient documentation

## 2024-09-15 DIAGNOSIS — T82898D Other specified complication of vascular prosthetic devices, implants and grafts, subsequent encounter: Secondary | ICD-10-CM | POA: Insufficient documentation

## 2024-09-16 ENCOUNTER — Telehealth (HOSPITAL_COMMUNITY): Payer: Self-pay | Admitting: Pharmacy Technician

## 2024-09-16 NOTE — Telephone Encounter (Signed)
 Auth Submission: NO AUTH NEEDED Site of care: [facility] Payer: [employer] DUAL  Medication & CPT/J Code(s) submitted: Q5106 - PR INJ RETACRIT  NON-ESRD USE Diagnosis Code: N18.4, D63.1 Route of submission (phone, fax, portal):  Phone # Fax # Auth type: Buy/Bill HB Units/visits requested: 20,000u every 2 weeks Reference number: [id number] Approval from: 09/16/2024 to 11/23/24    Dagoberto Armour, CPhT [facility] Phone: [phone] 09/16/2024

## 2024-09-17 ENCOUNTER — Other Ambulatory Visit: Payer: Self-pay | Admitting: Cardiology

## 2024-09-19 ENCOUNTER — Ambulatory Visit (HOSPITAL_COMMUNITY)
Admission: RE | Admit: 2024-09-19 | Discharge: 2024-09-19 | Disposition: A | Discharge status: Home / Self Care | Source: Ambulatory Visit | Attending: Cardiovascular Disease | Admitting: Cardiovascular Disease

## 2024-09-19 DIAGNOSIS — I779 Disorder of arteries and arterioles, unspecified: Secondary | ICD-10-CM | POA: Insufficient documentation

## 2024-09-19 DIAGNOSIS — I6523 Occlusion and stenosis of bilateral carotid arteries: Secondary | ICD-10-CM | POA: Insufficient documentation

## 2024-09-20 ENCOUNTER — Ambulatory Visit

## 2024-09-22 ENCOUNTER — Ambulatory Visit (HOSPITAL_COMMUNITY)
Admission: RE | Admit: 2024-09-22 | Discharge: 2024-09-22 | Disposition: A | Discharge status: Home / Self Care | Source: Ambulatory Visit | Attending: Nephrology | Admitting: Nephrology

## 2024-09-22 ENCOUNTER — Inpatient Hospital Stay (HOSPITAL_COMMUNITY)
Admission: RE | Admit: 2024-09-22 | Discharge: 2024-09-22 | Disposition: A | Source: Ambulatory Visit | Attending: Nephrology

## 2024-09-22 VITALS — BP 169/65 | HR 75 | Temp 97.7°F | Resp 16

## 2024-09-22 VITALS — BP 171/77 | HR 81 | Resp 17

## 2024-09-22 DIAGNOSIS — D631 Anemia in chronic kidney disease: Secondary | ICD-10-CM

## 2024-09-22 DIAGNOSIS — N189 Chronic kidney disease, unspecified: Secondary | ICD-10-CM | POA: Insufficient documentation

## 2024-09-22 DIAGNOSIS — N184 Chronic kidney disease, stage 4 (severe): Secondary | ICD-10-CM | POA: Diagnosis not present

## 2024-09-22 LAB — POCT HEMOGLOBIN-HEMACUE: Hemoglobin: 9.7 g/dL — ABNORMAL LOW (ref 12.0–15.0)

## 2024-09-22 LAB — GLUCOSE, CAPILLARY: Glucose-Capillary: 234 mg/dL — ABNORMAL HIGH (ref 70–99)

## 2024-09-22 MED ORDER — IRON SUCROSE 300 MG IVPB - SIMPLE MED
300.0000 mg | Freq: Once | Status: AC
  Administered 2024-09-22: 300 mg via INTRAVENOUS
  Filled 2024-09-22: qty 300

## 2024-09-22 MED ORDER — CLONIDINE HCL 0.1 MG PO TABS
ORAL_TABLET | ORAL | Status: AC
  Filled 2024-09-22: qty 1

## 2024-09-22 MED ORDER — CLONIDINE HCL 0.1 MG PO TABS
0.1000 mg | ORAL_TABLET | ORAL | Status: DC | PRN
  Administered 2024-09-22: 0.1 mg via ORAL

## 2024-09-22 MED ORDER — EPOETIN ALFA-EPBX 20000 UNIT/ML IJ SOLN
20000.0000 [IU] | Freq: Once | INTRAMUSCULAR | Status: AC
  Administered 2024-09-22: 20000 [IU] via SUBCUTANEOUS

## 2024-09-22 MED ORDER — EPOETIN ALFA-EPBX 20000 UNIT/ML IJ SOLN
INTRAMUSCULAR | Status: AC
  Filled 2024-09-22: qty 1

## 2024-09-22 NOTE — Progress Notes (Signed)
 Patient here today for IV venofer and retacrit  injection.  When patient arrived and VS taken her systolic was 199/75, we had her rest a few minutes and rechecked her and she was 200/76.  Clonidine  given per standing orders.  After 30 minutes her BP was 171/77 which met our standing orders to proceed with IV iron and retacrit .  Pt told the tech that she needed to go to the bathroom and when she got up to go she said everything went black for a few seconds and she could not see and she felt dizzy like  I checked her BP and it was 195/76.  Her CBG was 234 and she had eaten lunch with us .  She is weak on the left when I had her squeeze my hands but she stated that has been that way for a month ever since they attempted a fistula in that arm and it didn't work.  Her smile was symmetrical and pupils reactive and equal.  She said she took her amlodipine , lasix , and tresiba  today at 0630.  She said she has seen black a few times this week and it just started this week.  She is slightly SOB but that is her baseline.  She told me she had a carotid US  this week, 09/19/24 and Dr Darron is who ordered it and she sees Dr Shlomo but that her heart Dr does not know she has been having these issues where everything goes black.  I told her she needed to make her heart Dr aware and she verbalized understanding.  Pt stated she did not feel like she needed to go get evaluated in the ER, that she has a driver today and that she lives with her son and family.  I called [facility] and reported all of the above to her.  [facility] did not feel like we needed to call a code stroke,  and said there was not much we could do if the patient did not want to go to the ER and was alert and oriented, (which she is), but to call her back if we observe anything that was not her baseline or it happened again.  Called [facility] and reported all of the above to [facility].  Pt stated she wanted to go home,  recheck of her  BP prior to DC was 169/65 and pt was not having any adverse symptoms at this time.  Ok for DC per [facility] as long as her BP was better and she felt better.  Pt taken via wheelchair to her transportation upstairs and DC without complaint.

## 2024-09-23 ENCOUNTER — Ambulatory Visit: Payer: Self-pay | Admitting: Cardiovascular Disease

## 2024-09-23 DIAGNOSIS — I779 Disorder of arteries and arterioles, unspecified: Secondary | ICD-10-CM

## 2024-09-28 ENCOUNTER — Ambulatory Visit: Attending: Cardiology | Admitting: Cardiology

## 2024-09-28 ENCOUNTER — Encounter: Payer: Self-pay | Admitting: Cardiology

## 2024-09-28 VITALS — BP 168/72 | HR 74 | Ht 63.0 in | Wt 127.6 lb

## 2024-09-28 DIAGNOSIS — Z86718 Personal history of other venous thrombosis and embolism: Secondary | ICD-10-CM

## 2024-09-28 DIAGNOSIS — I2510 Atherosclerotic heart disease of native coronary artery without angina pectoris: Secondary | ICD-10-CM

## 2024-09-28 DIAGNOSIS — I10 Essential (primary) hypertension: Secondary | ICD-10-CM

## 2024-09-28 DIAGNOSIS — E785 Hyperlipidemia, unspecified: Secondary | ICD-10-CM | POA: Diagnosis not present

## 2024-09-28 DIAGNOSIS — I779 Disorder of arteries and arterioles, unspecified: Secondary | ICD-10-CM

## 2024-09-28 DIAGNOSIS — Z79899 Other long term (current) drug therapy: Secondary | ICD-10-CM

## 2024-09-28 DIAGNOSIS — I739 Peripheral vascular disease, unspecified: Secondary | ICD-10-CM

## 2024-09-28 LAB — LIPID PANEL
Chol/HDL Ratio: 1.8 ratio (ref 0.0–4.4)
Cholesterol, Total: 106 mg/dL (ref 100–199)
HDL: 58 mg/dL (ref >39)
LDL Chol Calc (NIH): 32 mg/dL (ref 0–99)
Triglycerides: 82 mg/dL (ref 0–149)
VLDL Cholesterol Cal: 16 mg/dL (ref 5–40)

## 2024-09-28 LAB — ALT: ALT: 52 IU/L — ABNORMAL HIGH (ref 0–32)

## 2024-09-28 MED ORDER — AMLODIPINE BESYLATE 5 MG PO TABS: 5.0000 mg | ORAL_TABLET | Freq: Every day | ORAL | 3 refills | Status: DC
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
## 2024-09-28 NOTE — Patient Instructions (Signed)
 Medication Instructions:  Please INCREASE your amlodipine  to 5 mg daily.   *If you need a refill on your cardiac medications before your next appointment, please call your pharmacy*  Lab Work: Please complete your FASTING lipid panel and ALT today in our first floor lab before you leave.   If you have labs (blood work) drawn today and your tests are completely normal, you will receive your results only by: MyChart Message (if you have MyChart) OR A paper copy in the mail If you have any lab test that is abnormal or we need to change your treatment, we will call you to review the results.  Testing/Procedures: None.  Follow-Up: At [facility], you and your health needs are our priority.  As part of our continuing mission to provide you with exceptional heart care, our providers are all part of one team.  This team includes your primary Cardiologist (physician) and Advanced Practice Providers or APPs (Physician Assistants and Nurse Practitioners) who all work together to provide you with the care you need, when you need it.  Your next appointment:   6 month(s)  Provider:   Wilbert Bihari, MD    We recommend signing up for the patient portal called MyChart.  Sign up information is provided on this After Visit Summary.  MyChart is used to connect with patients for Virtual Visits (Telemedicine).  Patients are able to view lab/test results, encounter notes, upcoming appointments, etc.  Non-urgent messages can be sent to your provider as well.   To learn more about what you can do with MyChart, go to [email or web address].   Other Instructions Please check your blood pressure twice a day for one week. Check your blood pressure once at lunch and once at dinner. Write down your blood pressure readings along with the date and time of each reading, as well as a heart rate if your machine provides that information. Then, drop off your readings at our front desk, send a picture of the log  over Mychart or send the readings to us  in the mail. You can also call our main phone number [phone]) as all of our operators are trained to take down your blood pressure readings.

## 2024-09-28 NOTE — Addendum Note (Signed)
 Addended by: JANIT GENI CROME on: 09/28/2024 10:52 AM   Modules accepted: Orders

## 2024-09-29 ENCOUNTER — Ambulatory Visit: Payer: Self-pay | Admitting: Cardiology

## 2024-09-30 ENCOUNTER — Ambulatory Visit: Attending: Vascular Surgery

## 2024-09-30 DIAGNOSIS — T82898D Other specified complication of vascular prosthetic devices, implants and grafts, subsequent encounter: Secondary | ICD-10-CM | POA: Insufficient documentation

## 2024-09-30 DIAGNOSIS — R29898 Other symptoms and signs involving the musculoskeletal system: Secondary | ICD-10-CM | POA: Diagnosis present

## 2024-09-30 DIAGNOSIS — R278 Other lack of coordination: Secondary | ICD-10-CM | POA: Insufficient documentation

## 2024-09-30 DIAGNOSIS — R208 Other disturbances of skin sensation: Secondary | ICD-10-CM | POA: Insufficient documentation

## 2024-09-30 DIAGNOSIS — M79642 Pain in left hand: Secondary | ICD-10-CM | POA: Diagnosis present

## 2024-09-30 DIAGNOSIS — M79602 Pain in left arm: Secondary | ICD-10-CM | POA: Insufficient documentation

## 2024-09-30 DIAGNOSIS — M6281 Muscle weakness (generalized): Secondary | ICD-10-CM | POA: Diagnosis present

## 2024-09-30 NOTE — Patient Instructions (Addendum)
 WEBSITE: [email or web address]     Safety considerations for loss of sensation:   Look at affected hand when using it!   Do NOT use affected arm for anything: sharp, hot, breakable, or too heavy  Always check temperature of water (for showering, washing dishes, etc) with UNaffected arm/extremity  Consider travel mugs w/ lids to transport hot liquids/coffee  Consider alternative options and/or adaptive equipment to make things safer (ex: hand chopper or cut resistant glove for chopping vegetables)   Avoid cold temperatures as well (wear glove in cold temperatures, get ice w/ unaffected extremity)  AVOID handling chemicals and machinery
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
## 2024-10-03 ENCOUNTER — Other Ambulatory Visit: Payer: Self-pay

## 2024-10-03 DIAGNOSIS — I2510 Atherosclerotic heart disease of native coronary artery without angina pectoris: Secondary | ICD-10-CM

## 2024-10-03 DIAGNOSIS — Z79899 Other long term (current) drug therapy: Secondary | ICD-10-CM

## 2024-10-03 DIAGNOSIS — E7800 Pure hypercholesterolemia, unspecified: Secondary | ICD-10-CM

## 2024-10-03 MED ORDER — ROSUVASTATIN CALCIUM 20 MG PO TABS: 20.0000 mg | ORAL_TABLET | Freq: Every day | ORAL | 3 refills | Status: DC

## 2024-10-06 ENCOUNTER — Ambulatory Visit (HOSPITAL_COMMUNITY)
Admission: RE | Admit: 2024-10-06 | Discharge: 2024-10-06 | Disposition: A | Source: Ambulatory Visit | Attending: Nephrology

## 2024-10-06 ENCOUNTER — Ambulatory Visit (HOSPITAL_COMMUNITY)
Admission: RE | Admit: 2024-10-06 | Discharge: 2024-10-06 | Disposition: A | Discharge status: Home / Self Care | Source: Ambulatory Visit | Attending: Nephrology | Admitting: Nephrology

## 2024-10-06 VITALS — BP 144/64 | HR 71 | Temp 98.4°F | Resp 16

## 2024-10-06 DIAGNOSIS — D631 Anemia in chronic kidney disease: Secondary | ICD-10-CM | POA: Insufficient documentation

## 2024-10-06 DIAGNOSIS — N184 Chronic kidney disease, stage 4 (severe): Secondary | ICD-10-CM | POA: Insufficient documentation

## 2024-10-06 DIAGNOSIS — N189 Chronic kidney disease, unspecified: Secondary | ICD-10-CM | POA: Insufficient documentation

## 2024-10-06 LAB — POCT HEMOGLOBIN-HEMACUE: Hemoglobin: 9.6 g/dL — ABNORMAL LOW (ref 12.0–15.0)

## 2024-10-06 MED ORDER — IRON SUCROSE 300 MG IVPB - SIMPLE MED
300.0000 mg | Freq: Once | Status: AC
  Administered 2024-10-06: 300 mg via INTRAVENOUS
  Filled 2024-10-06: qty 300

## 2024-10-06 MED ORDER — EPOETIN ALFA-EPBX 20000 UNIT/ML IJ SOLN
20000.0000 [IU] | Freq: Once | INTRAMUSCULAR | Status: AC
  Administered 2024-10-06: 20000 [IU] via SUBCUTANEOUS

## 2024-10-06 MED ORDER — EPOETIN ALFA-EPBX 20000 UNIT/ML IJ SOLN
INTRAMUSCULAR | Status: AC
  Filled 2024-10-06: qty 1

## 2024-10-06 MED ORDER — EPOETIN ALFA-EPBX 10000 UNIT/ML IJ SOLN
INTRAMUSCULAR | Status: AC
  Filled 2024-10-06: qty 1

## 2024-10-07 ENCOUNTER — Ambulatory Visit

## 2024-10-13 NOTE — Telephone Encounter (Signed)
-----   Message from Nurse Roxie PARAS sent at 09/29/2024  3:27 PM EST -----  ----- Message ----- From: Shlomo Wilbert SAUNDERS, MD Sent: 09/29/2024  12:01 PM EST To: [facility] Triage  LDL good and ALT still elevated - decrease Crestor  to 20mg  daily and continue on Zetia  and Repatha .  Check FLP and ALT in 8 weeks ----- Message ----- From: Interface, [facility] Lab Results In Sent: 09/28/2024  11:36 PM EST To: Wilbert SAUNDERS Shlomo, MD

## 2024-10-13 NOTE — Telephone Encounter (Signed)
 Call to patient to advise LDL too low and Dr. Shlomo would decrease Crestor  to 20mg  daily and have her continue on Zetia  and Repatha . Patient agrees to dose change and to check FLP and ALT in 8 weeks, all orders placed.

## 2024-10-14 ENCOUNTER — Ambulatory Visit

## 2024-10-14 DIAGNOSIS — R278 Other lack of coordination: Secondary | ICD-10-CM | POA: Diagnosis not present

## 2024-10-14 DIAGNOSIS — M6281 Muscle weakness (generalized): Secondary | ICD-10-CM

## 2024-10-14 DIAGNOSIS — M79642 Pain in left hand: Secondary | ICD-10-CM

## 2024-10-14 DIAGNOSIS — R208 Other disturbances of skin sensation: Secondary | ICD-10-CM

## 2024-10-14 DIAGNOSIS — R29898 Other symptoms and signs involving the musculoskeletal system: Secondary | ICD-10-CM

## 2024-10-14 DIAGNOSIS — T82898D Other specified complication of vascular prosthetic devices, implants and grafts, subsequent encounter: Secondary | ICD-10-CM
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
## 2024-10-18 ENCOUNTER — Ambulatory Visit: Admitting: Occupational Therapy

## 2024-10-18 DIAGNOSIS — R278 Other lack of coordination: Secondary | ICD-10-CM

## 2024-10-18 DIAGNOSIS — M79642 Pain in left hand: Secondary | ICD-10-CM

## 2024-10-18 DIAGNOSIS — R29898 Other symptoms and signs involving the musculoskeletal system: Secondary | ICD-10-CM

## 2024-10-18 DIAGNOSIS — M6281 Muscle weakness (generalized): Secondary | ICD-10-CM

## 2024-10-18 DIAGNOSIS — M79602 Pain in left arm: Secondary | ICD-10-CM

## 2024-10-18 DIAGNOSIS — R208 Other disturbances of skin sensation: Secondary | ICD-10-CM
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
## 2024-10-19 ENCOUNTER — Encounter (HOSPITAL_COMMUNITY)
Admission: RE | Admit: 2024-10-19 | Discharge: 2024-10-19 | Disposition: A | Discharge status: Home / Self Care | Source: Ambulatory Visit | Attending: Nephrology | Admitting: Nephrology

## 2024-10-19 VITALS — BP 135/67 | HR 70 | Temp 97.4°F | Resp 16

## 2024-10-19 DIAGNOSIS — D631 Anemia in chronic kidney disease: Secondary | ICD-10-CM | POA: Diagnosis present

## 2024-10-19 DIAGNOSIS — N184 Chronic kidney disease, stage 4 (severe): Secondary | ICD-10-CM | POA: Diagnosis present

## 2024-10-19 LAB — IRON AND TIBC
Iron: 67 ug/dL (ref 28–170)
Saturation Ratios: 34 % — ABNORMAL HIGH (ref 10.4–31.8)
TIBC: 196 ug/dL — ABNORMAL LOW (ref 250–450)
UIBC: 129 ug/dL

## 2024-10-19 LAB — POCT HEMOGLOBIN-HEMACUE: Hemoglobin: 9.8 g/dL — ABNORMAL LOW (ref 12.0–15.0)

## 2024-10-19 LAB — FERRITIN: Ferritin: 352 ng/mL — ABNORMAL HIGH (ref 11–307)

## 2024-10-19 MED ORDER — EPOETIN ALFA-EPBX 20000 UNIT/ML IJ SOLN
INTRAMUSCULAR | Status: AC
  Filled 2024-10-19: qty 1

## 2024-10-19 MED ORDER — EPOETIN ALFA-EPBX 20000 UNIT/ML IJ SOLN
20000.0000 [IU] | Freq: Once | INTRAMUSCULAR | Status: AC
  Administered 2024-10-19: 20000 [IU] via SUBCUTANEOUS

## 2024-10-25 ENCOUNTER — Ambulatory Visit: Attending: Vascular Surgery

## 2024-10-25 DIAGNOSIS — M79602 Pain in left arm: Secondary | ICD-10-CM | POA: Diagnosis present

## 2024-10-25 DIAGNOSIS — M79642 Pain in left hand: Secondary | ICD-10-CM | POA: Insufficient documentation

## 2024-10-25 DIAGNOSIS — R29898 Other symptoms and signs involving the musculoskeletal system: Secondary | ICD-10-CM | POA: Insufficient documentation

## 2024-10-25 DIAGNOSIS — R278 Other lack of coordination: Secondary | ICD-10-CM | POA: Diagnosis present

## 2024-10-25 DIAGNOSIS — R208 Other disturbances of skin sensation: Secondary | ICD-10-CM | POA: Diagnosis present

## 2024-10-25 DIAGNOSIS — T82898D Other specified complication of vascular prosthetic devices, implants and grafts, subsequent encounter: Secondary | ICD-10-CM | POA: Insufficient documentation
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
## 2024-11-01 ENCOUNTER — Other Ambulatory Visit: Payer: Self-pay | Admitting: Cardiology

## 2024-11-01 ENCOUNTER — Ambulatory Visit

## 2024-11-02 ENCOUNTER — Encounter (HOSPITAL_COMMUNITY)

## 2024-11-03 ENCOUNTER — Inpatient Hospital Stay (HOSPITAL_COMMUNITY): Admission: RE | Admit: 2024-11-03 | Discharge: 2024-11-03 | Attending: Nephrology

## 2024-11-03 VITALS — BP 145/67 | HR 83 | Temp 97.3°F | Resp 16

## 2024-11-03 DIAGNOSIS — D631 Anemia in chronic kidney disease: Secondary | ICD-10-CM | POA: Insufficient documentation

## 2024-11-03 DIAGNOSIS — N184 Chronic kidney disease, stage 4 (severe): Secondary | ICD-10-CM | POA: Insufficient documentation

## 2024-11-03 LAB — POCT HEMOGLOBIN-HEMACUE: Hemoglobin: 10.1 g/dL — ABNORMAL LOW (ref 12.0–15.0)

## 2024-11-03 MED ORDER — EPOETIN ALFA-EPBX 20000 UNIT/ML IJ SOLN
INTRAMUSCULAR | Status: AC
  Filled 2024-11-03: qty 1

## 2024-11-03 MED ADMIN — Epoetin Alfa-epbx Inj 20000 Unit/ML: 20000 [IU] | SUBCUTANEOUS | NDC 00069131101

## 2024-11-08 ENCOUNTER — Ambulatory Visit

## 2024-11-08 DIAGNOSIS — T82898D Other specified complication of vascular prosthetic devices, implants and grafts, subsequent encounter: Secondary | ICD-10-CM

## 2024-11-08 DIAGNOSIS — R29898 Other symptoms and signs involving the musculoskeletal system: Secondary | ICD-10-CM

## 2024-11-08 DIAGNOSIS — M79642 Pain in left hand: Secondary | ICD-10-CM

## 2024-11-08 DIAGNOSIS — M79602 Pain in left arm: Secondary | ICD-10-CM
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
## 2024-11-15 ENCOUNTER — Other Ambulatory Visit: Payer: Self-pay | Admitting: Physician Assistant

## 2024-11-15 ENCOUNTER — Other Ambulatory Visit: Payer: Self-pay | Admitting: Cardiovascular Disease

## 2024-11-15 NOTE — Telephone Encounter (Signed)
 Prescription refill request for Xarelto  received.  Indication:dvt Last office visit:11/25 Weight:57.9  kg Age:68 Scr:4.36  9/25 CrCl:11.29  ml/min  Prescription refilled

## 2024-11-16 ENCOUNTER — Ambulatory Visit: Admitting: Podiatry

## 2024-11-16 DIAGNOSIS — T25131A Burn of first degree of right toe(s) (nail), initial encounter: Secondary | ICD-10-CM
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
## 2024-11-18 ENCOUNTER — Inpatient Hospital Stay (HOSPITAL_COMMUNITY): Admission: RE | Admit: 2024-11-18

## 2024-11-25 ENCOUNTER — Encounter (HOSPITAL_COMMUNITY)
Admission: RE | Admit: 2024-11-25 | Discharge: 2024-11-25 | Disposition: A | Discharge status: Home / Self Care | Source: Ambulatory Visit | Attending: Nephrology | Admitting: Nephrology

## 2024-11-25 VITALS — BP 151/55 | HR 67 | Temp 97.3°F | Resp 16

## 2024-11-25 DIAGNOSIS — D631 Anemia in chronic kidney disease: Secondary | ICD-10-CM | POA: Insufficient documentation

## 2024-11-25 DIAGNOSIS — N184 Chronic kidney disease, stage 4 (severe): Secondary | ICD-10-CM | POA: Insufficient documentation

## 2024-11-25 LAB — IRON AND TIBC
Iron: 83 ug/dL (ref 28–170)
Saturation Ratios: 42 % — ABNORMAL HIGH (ref 10.4–31.8)
TIBC: 197 ug/dL — ABNORMAL LOW (ref 250–450)
UIBC: 114 ug/dL

## 2024-11-25 LAB — FERRITIN: Ferritin: 413 ng/mL — ABNORMAL HIGH (ref 11–307)

## 2024-11-25 MED ORDER — EPOETIN ALFA-EPBX 20000 UNIT/ML IJ SOLN
20000.0000 [IU] | Freq: Once | INTRAMUSCULAR | Status: AC
  Administered 2024-11-25: 20000 [IU] via SUBCUTANEOUS

## 2024-11-25 MED ORDER — EPOETIN ALFA-EPBX 20000 UNIT/ML IJ SOLN
INTRAMUSCULAR | Status: AC
  Filled 2024-11-25: qty 1

## 2024-11-25 NOTE — Progress Notes (Signed)
 Patient's HGB had dropped from 10.1 on 11/03/24 to 8.8 today. Patient stated she has noticied black stools again. She had just had GI surgery in August. Called office left message and asked on call doctor to return call.  No Call returned - patient was encouraged to go to ED but stated she did not want t o do that . She stated she would call her GI doctor and will see [facility] 1/12.

## 2024-11-26 ENCOUNTER — Inpatient Hospital Stay (HOSPITAL_COMMUNITY)

## 2024-11-26 ENCOUNTER — Emergency Department (HOSPITAL_COMMUNITY)

## 2024-11-26 ENCOUNTER — Other Ambulatory Visit: Payer: Self-pay

## 2024-11-26 ENCOUNTER — Inpatient Hospital Stay (HOSPITAL_COMMUNITY)
Admission: EM | Admit: 2024-11-26 | Discharge: 2024-11-30 | DRG: 193 | Disposition: A | Discharge status: Home / Self Care | Attending: Internal Medicine | Admitting: Internal Medicine

## 2024-11-26 DIAGNOSIS — Z1152 Encounter for screening for COVID-19: Secondary | ICD-10-CM

## 2024-11-26 DIAGNOSIS — N179 Acute kidney failure, unspecified: Secondary | ICD-10-CM | POA: Diagnosis present

## 2024-11-26 DIAGNOSIS — Z955 Presence of coronary angioplasty implant and graft: Secondary | ICD-10-CM

## 2024-11-26 DIAGNOSIS — Z808 Family history of malignant neoplasm of other organs or systems: Secondary | ICD-10-CM

## 2024-11-26 DIAGNOSIS — Z86718 Personal history of other venous thrombosis and embolism: Secondary | ICD-10-CM | POA: Diagnosis not present

## 2024-11-26 DIAGNOSIS — X58XXXA Exposure to other specified factors, initial encounter: Secondary | ICD-10-CM | POA: Diagnosis present

## 2024-11-26 DIAGNOSIS — J440 Chronic obstructive pulmonary disease with acute lower respiratory infection: Secondary | ICD-10-CM | POA: Diagnosis present

## 2024-11-26 DIAGNOSIS — E1122 Type 2 diabetes mellitus with diabetic chronic kidney disease: Secondary | ICD-10-CM | POA: Diagnosis present

## 2024-11-26 DIAGNOSIS — E1151 Type 2 diabetes mellitus with diabetic peripheral angiopathy without gangrene: Secondary | ICD-10-CM | POA: Diagnosis present

## 2024-11-26 DIAGNOSIS — S91101A Unspecified open wound of right great toe without damage to nail, initial encounter: Secondary | ICD-10-CM | POA: Diagnosis present

## 2024-11-26 DIAGNOSIS — I739 Peripheral vascular disease, unspecified: Secondary | ICD-10-CM | POA: Diagnosis present

## 2024-11-26 DIAGNOSIS — Z7901 Long term (current) use of anticoagulants: Secondary | ICD-10-CM

## 2024-11-26 DIAGNOSIS — R0603 Acute respiratory distress: Secondary | ICD-10-CM | POA: Diagnosis not present

## 2024-11-26 DIAGNOSIS — I2510 Atherosclerotic heart disease of native coronary artery without angina pectoris: Secondary | ICD-10-CM | POA: Diagnosis present

## 2024-11-26 DIAGNOSIS — E8809 Other disorders of plasma-protein metabolism, not elsewhere classified: Secondary | ICD-10-CM | POA: Diagnosis present

## 2024-11-26 DIAGNOSIS — E785 Hyperlipidemia, unspecified: Secondary | ICD-10-CM | POA: Diagnosis present

## 2024-11-26 DIAGNOSIS — D631 Anemia in chronic kidney disease: Secondary | ICD-10-CM | POA: Diagnosis present

## 2024-11-26 DIAGNOSIS — I5032 Chronic diastolic (congestive) heart failure: Secondary | ICD-10-CM | POA: Diagnosis present

## 2024-11-26 DIAGNOSIS — Z794 Long term (current) use of insulin: Secondary | ICD-10-CM

## 2024-11-26 DIAGNOSIS — I2583 Coronary atherosclerosis due to lipid rich plaque: Secondary | ICD-10-CM | POA: Diagnosis not present

## 2024-11-26 DIAGNOSIS — Z992 Dependence on renal dialysis: Secondary | ICD-10-CM | POA: Diagnosis not present

## 2024-11-26 DIAGNOSIS — N185 Chronic kidney disease, stage 5: Secondary | ICD-10-CM | POA: Diagnosis present

## 2024-11-26 DIAGNOSIS — Z95820 Peripheral vascular angioplasty status with implants and grafts: Secondary | ICD-10-CM

## 2024-11-26 DIAGNOSIS — Z800 Family history of malignant neoplasm of digestive organs: Secondary | ICD-10-CM

## 2024-11-26 DIAGNOSIS — Z8419 Family history of other disorders of kidney and ureter: Secondary | ICD-10-CM

## 2024-11-26 DIAGNOSIS — I132 Hypertensive heart and chronic kidney disease with heart failure and with stage 5 chronic kidney disease, or end stage renal disease: Secondary | ICD-10-CM | POA: Diagnosis present

## 2024-11-26 DIAGNOSIS — E872 Acidosis, unspecified: Secondary | ICD-10-CM | POA: Diagnosis present

## 2024-11-26 DIAGNOSIS — T380X5A Adverse effect of glucocorticoids and synthetic analogues, initial encounter: Secondary | ICD-10-CM | POA: Diagnosis present

## 2024-11-26 DIAGNOSIS — I252 Old myocardial infarction: Secondary | ICD-10-CM

## 2024-11-26 DIAGNOSIS — J189 Pneumonia, unspecified organism: Secondary | ICD-10-CM | POA: Diagnosis present

## 2024-11-26 DIAGNOSIS — Z888 Allergy status to other drugs, medicaments and biological substances status: Secondary | ICD-10-CM

## 2024-11-26 DIAGNOSIS — E1142 Type 2 diabetes mellitus with diabetic polyneuropathy: Secondary | ICD-10-CM | POA: Diagnosis present

## 2024-11-26 DIAGNOSIS — Z8249 Family history of ischemic heart disease and other diseases of the circulatory system: Secondary | ICD-10-CM

## 2024-11-26 DIAGNOSIS — R9389 Abnormal findings on diagnostic imaging of other specified body structures: Secondary | ICD-10-CM

## 2024-11-26 DIAGNOSIS — Z981 Arthrodesis status: Secondary | ICD-10-CM

## 2024-11-26 DIAGNOSIS — Z23 Encounter for immunization: Secondary | ICD-10-CM | POA: Diagnosis present

## 2024-11-26 DIAGNOSIS — Z833 Family history of diabetes mellitus: Secondary | ICD-10-CM

## 2024-11-26 DIAGNOSIS — J9601 Acute respiratory failure with hypoxia: Principal | ICD-10-CM | POA: Diagnosis present

## 2024-11-26 DIAGNOSIS — F17210 Nicotine dependence, cigarettes, uncomplicated: Secondary | ICD-10-CM | POA: Diagnosis present

## 2024-11-26 DIAGNOSIS — K921 Melena: Secondary | ICD-10-CM | POA: Diagnosis present

## 2024-11-26 DIAGNOSIS — I10 Essential (primary) hypertension: Secondary | ICD-10-CM | POA: Diagnosis not present

## 2024-11-26 DIAGNOSIS — Z79899 Other long term (current) drug therapy: Secondary | ICD-10-CM

## 2024-11-26 DIAGNOSIS — Z860100 Personal history of colon polyps, unspecified: Secondary | ICD-10-CM

## 2024-11-26 DIAGNOSIS — J441 Chronic obstructive pulmonary disease with (acute) exacerbation: Secondary | ICD-10-CM | POA: Diagnosis present

## 2024-11-26 DIAGNOSIS — K219 Gastro-esophageal reflux disease without esophagitis: Secondary | ICD-10-CM | POA: Diagnosis present

## 2024-11-26 LAB — I-STAT CHEM 8, ED
BUN: 60 mg/dL — ABNORMAL HIGH (ref 8–23)
Calcium, Ion: 1.05 mmol/L — ABNORMAL LOW (ref 1.15–1.40)
Chloride: 107 mmol/L (ref 98–111)
Creatinine, Ser: 5.5 mg/dL — ABNORMAL HIGH (ref 0.44–1.00)
Glucose, Bld: 154 mg/dL — ABNORMAL HIGH (ref 70–99)
HCT: 32 % — ABNORMAL LOW (ref 36.0–46.0)
Hemoglobin: 10.9 g/dL — ABNORMAL LOW (ref 12.0–15.0)
Potassium: 3.8 mmol/L (ref 3.5–5.1)
Sodium: 134 mmol/L — ABNORMAL LOW (ref 135–145)
TCO2: 15 mmol/L — ABNORMAL LOW (ref 22–32)

## 2024-11-26 LAB — RESP PANEL BY RT-PCR (RSV, FLU A&B, COVID)  RVPGX2
Influenza A by PCR: NEGATIVE
Influenza B by PCR: NEGATIVE
Resp Syncytial Virus by PCR: NEGATIVE
SARS Coronavirus 2 by RT PCR: NEGATIVE

## 2024-11-26 LAB — COMPREHENSIVE METABOLIC PANEL WITH GFR
ALT: 46 U/L — ABNORMAL HIGH (ref 0–44)
AST: 48 U/L — ABNORMAL HIGH (ref 15–41)
Albumin: 3.2 g/dL — ABNORMAL LOW (ref 3.5–5.0)
Alkaline Phosphatase: 96 U/L (ref 38–126)
Anion gap: 16 — ABNORMAL HIGH (ref 5–15)
BUN: 64 mg/dL — ABNORMAL HIGH (ref 8–23)
CO2: 14 mmol/L — ABNORMAL LOW (ref 22–32)
Calcium: 7.8 mg/dL — ABNORMAL LOW (ref 8.9–10.3)
Chloride: 102 mmol/L (ref 98–111)
Creatinine, Ser: 4.97 mg/dL — ABNORMAL HIGH (ref 0.44–1.00)
GFR, Estimated: 9 mL/min — ABNORMAL LOW
Glucose, Bld: 161 mg/dL — ABNORMAL HIGH (ref 70–99)
Potassium: 3.9 mmol/L (ref 3.5–5.1)
Sodium: 131 mmol/L — ABNORMAL LOW (ref 135–145)
Total Bilirubin: 0.2 mg/dL (ref 0.0–1.2)
Total Protein: 6.3 g/dL — ABNORMAL LOW (ref 6.5–8.1)

## 2024-11-26 LAB — CBC
HCT: 29.8 % — ABNORMAL LOW (ref 36.0–46.0)
Hemoglobin: 9.3 g/dL — ABNORMAL LOW (ref 12.0–15.0)
MCH: 22.7 pg — ABNORMAL LOW (ref 26.0–34.0)
MCHC: 31.2 g/dL (ref 30.0–36.0)
MCV: 72.7 fL — ABNORMAL LOW (ref 80.0–100.0)
Platelets: 541 K/uL — ABNORMAL HIGH (ref 150–400)
RBC: 4.1 MIL/uL (ref 3.87–5.11)
RDW: 21.6 % — ABNORMAL HIGH (ref 11.5–15.5)
WBC: 16.7 K/uL — ABNORMAL HIGH (ref 4.0–10.5)
nRBC: 0 % (ref 0.0–0.2)

## 2024-11-26 LAB — CBG MONITORING, ED: Glucose-Capillary: 381 mg/dL — ABNORMAL HIGH (ref 70–99)

## 2024-11-26 LAB — POC OCCULT BLOOD, ED: Fecal Occult Blood, POC: NEGATIVE

## 2024-11-26 LAB — PRO BRAIN NATRIURETIC PEPTIDE: Pro Brain Natriuretic Peptide: 13902 pg/mL — ABNORMAL HIGH

## 2024-11-26 LAB — TROPONIN T, HIGH SENSITIVITY
Troponin T High Sensitivity: 124 ng/L (ref 0–19)
Troponin T High Sensitivity: 137 ng/L (ref 0–19)

## 2024-11-26 MED ORDER — INSULIN ASPART 100 UNIT/ML IJ SOLN
0.0000 [IU] | Freq: Three times a day (TID) | INTRAMUSCULAR | Status: DC
  Administered 2024-11-27: 3 [IU] via SUBCUTANEOUS
  Administered 2024-11-27: 5 [IU] via SUBCUTANEOUS
  Administered 2024-11-27: 3 [IU] via SUBCUTANEOUS
  Administered 2024-11-28: 2 [IU] via SUBCUTANEOUS
  Administered 2024-11-28: 7 [IU] via SUBCUTANEOUS
  Administered 2024-11-28: 1 [IU] via SUBCUTANEOUS
  Administered 2024-11-29: 5 [IU] via SUBCUTANEOUS
  Administered 2024-11-29: 7 [IU] via SUBCUTANEOUS
  Administered 2024-11-29: 3 [IU] via SUBCUTANEOUS
  Administered 2024-11-30 (×2): 5 [IU] via SUBCUTANEOUS
  Filled 2024-11-26 (×2): qty 5
  Filled 2024-11-26: qty 3
  Filled 2024-11-26 (×2): qty 7
  Filled 2024-11-26 (×2): qty 5
  Filled 2024-11-26: qty 7
  Filled 2024-11-26: qty 1
  Filled 2024-11-26: qty 3
  Filled 2024-11-26: qty 2

## 2024-11-26 MED ORDER — IPRATROPIUM-ALBUTEROL 0.5-2.5 (3) MG/3ML IN SOLN
3.0000 mL | Freq: Four times a day (QID) | RESPIRATORY_TRACT | Status: DC
  Administered 2024-11-26 – 2024-11-30 (×13): 3 mL via RESPIRATORY_TRACT
  Filled 2024-11-26 (×13): qty 3

## 2024-11-26 MED ORDER — ALBUTEROL SULFATE (2.5 MG/3ML) 0.083% IN NEBU: 2.5000 mg | INHALATION_SOLUTION | RESPIRATORY_TRACT | Status: DC | PRN

## 2024-11-26 MED ORDER — ALBUTEROL SULFATE (2.5 MG/3ML) 0.083% IN NEBU
7.5000 mg | INHALATION_SOLUTION | Freq: Once | RESPIRATORY_TRACT | Status: AC
  Administered 2024-11-26: 7.5 mg via RESPIRATORY_TRACT
  Filled 2024-11-26: qty 9

## 2024-11-26 MED ORDER — ONDANSETRON HCL 4 MG/2ML IJ SOLN: 4.0000 mg | Freq: Four times a day (QID) | INTRAMUSCULAR | Status: DC | PRN

## 2024-11-26 MED ORDER — FUROSEMIDE 10 MG/ML IJ SOLN
40.0000 mg | Freq: Once | INTRAMUSCULAR | Status: AC
  Administered 2024-11-26: 40 mg via INTRAVENOUS
  Filled 2024-11-26: qty 4

## 2024-11-26 MED ORDER — CARVEDILOL 3.125 MG PO TABS
3.1250 mg | ORAL_TABLET | Freq: Two times a day (BID) | ORAL | Status: DC
  Administered 2024-11-27 – 2024-11-30 (×7): 3.125 mg via ORAL
  Filled 2024-11-26 (×7): qty 1

## 2024-11-26 MED ORDER — NITROGLYCERIN 2 % TD OINT
1.0000 [in_us] | TOPICAL_OINTMENT | Freq: Once | TRANSDERMAL | Status: AC
  Administered 2024-11-26: 1 [in_us] via TOPICAL
  Filled 2024-11-26: qty 1

## 2024-11-26 MED ORDER — SODIUM CHLORIDE 0.9 % IV SOLN
1.0000 g | INTRAVENOUS | Status: DC
  Administered 2024-11-27 – 2024-11-29 (×3): 1 g via INTRAVENOUS
  Filled 2024-11-26 (×5): qty 10

## 2024-11-26 MED ORDER — ACETAMINOPHEN 500 MG PO TABS: 1000.0000 mg | ORAL_TABLET | ORAL | Status: DC

## 2024-11-26 MED ORDER — SODIUM CHLORIDE 0.9 % IV SOLN
1.0000 g | Freq: Once | INTRAVENOUS | Status: AC
  Administered 2024-11-26: 1 g via INTRAVENOUS
  Filled 2024-11-26: qty 10

## 2024-11-26 MED ORDER — HYDRALAZINE HCL 20 MG/ML IJ SOLN
10.0000 mg | Freq: Four times a day (QID) | INTRAMUSCULAR | Status: DC | PRN
  Administered 2024-11-28 – 2024-11-30 (×4): 10 mg via INTRAVENOUS
  Filled 2024-11-26 (×5): qty 1

## 2024-11-26 MED ORDER — SODIUM CHLORIDE 0.9 % IV SOLN
500.0000 mg | Freq: Once | INTRAVENOUS | Status: AC
  Administered 2024-11-26: 500 mg via INTRAVENOUS
  Filled 2024-11-26: qty 5

## 2024-11-26 MED ORDER — ACETAMINOPHEN 325 MG PO TABS
650.0000 mg | ORAL_TABLET | Freq: Four times a day (QID) | ORAL | Status: DC | PRN
  Administered 2024-11-26 – 2024-11-28 (×3): 650 mg via ORAL
  Filled 2024-11-26 (×3): qty 2

## 2024-11-26 MED ORDER — PANTOPRAZOLE SODIUM 40 MG IV SOLR: 40.0000 mg | Freq: Two times a day (BID) | INTRAVENOUS | Status: DC

## 2024-11-26 MED ORDER — OXYCODONE-ACETAMINOPHEN 5-325 MG PO TABS
1.0000 | ORAL_TABLET | Freq: Once | ORAL | Status: AC
  Administered 2024-11-26: 1 via ORAL
  Filled 2024-11-26: qty 1

## 2024-11-26 MED ORDER — PANTOPRAZOLE SODIUM 40 MG IV SOLR
40.0000 mg | Freq: Two times a day (BID) | INTRAVENOUS | Status: DC
  Administered 2024-11-26 – 2024-11-30 (×8): 40 mg via INTRAVENOUS
  Filled 2024-11-26 (×8): qty 10

## 2024-11-26 MED ORDER — ASPIRIN 81 MG PO CHEW
324.0000 mg | CHEWABLE_TABLET | Freq: Once | ORAL | Status: AC
  Administered 2024-11-26: 324 mg via ORAL
  Filled 2024-11-26: qty 4

## 2024-11-26 MED ORDER — ACETAMINOPHEN 650 MG RE SUPP: 650.0000 mg | Freq: Four times a day (QID) | RECTAL | Status: DC | PRN

## 2024-11-26 MED ORDER — CLONIDINE HCL 0.1 MG PO TABS
0.1000 mg | ORAL_TABLET | Freq: Two times a day (BID) | ORAL | Status: DC
  Administered 2024-11-26 – 2024-11-30 (×8): 0.1 mg via ORAL
  Filled 2024-11-26 (×8): qty 1

## 2024-11-26 MED ORDER — METHYLPREDNISOLONE SODIUM SUCC 40 MG IJ SOLR
40.0000 mg | Freq: Two times a day (BID) | INTRAMUSCULAR | Status: DC
  Administered 2024-11-26 – 2024-11-28 (×4): 40 mg via INTRAVENOUS
  Filled 2024-11-26 (×4): qty 1

## 2024-11-26 MED ORDER — ONDANSETRON HCL 4 MG PO TABS: 4.0000 mg | ORAL_TABLET | Freq: Four times a day (QID) | ORAL | Status: DC | PRN

## 2024-11-26 MED ORDER — AZITHROMYCIN 500 MG PO TABS
500.0000 mg | ORAL_TABLET | Freq: Every day | ORAL | Status: AC
  Administered 2024-11-27 – 2024-11-30 (×4): 500 mg via ORAL
  Filled 2024-11-26 (×4): qty 1

## 2024-11-26 MED ORDER — ACETAMINOPHEN 325 MG PO TABS
650.0000 mg | ORAL_TABLET | ORAL | Status: AC
  Administered 2024-11-26: 650 mg via ORAL
  Filled 2024-11-26: qty 2

## 2024-11-26 MED ORDER — SODIUM BICARBONATE 650 MG PO TABS
650.0000 mg | ORAL_TABLET | Freq: Two times a day (BID) | ORAL | Status: DC
  Administered 2024-11-27 – 2024-11-30 (×7): 650 mg via ORAL
  Filled 2024-11-26 (×8): qty 1

## 2024-11-26 NOTE — ED Notes (Signed)
 To CT

## 2024-11-26 NOTE — ED Triage Notes (Addendum)
 PT BIB [facility] d/t SHOB x2 days and mid sternal cp, upon ems arrival 86% ra w/ inspiratory/expiratory wheezing. EMS gave 125 solu-medrol , 2g mag, .5 Atrovent, 10 albuterol . EMS reports abd distention , rales left lower lobe. Pt does wear a right boot d/t toe injury per ems.  Hx hypertension, CHF , COPD 174/70 BP 95 HR RR 18

## 2024-11-26 NOTE — ED Notes (Signed)
 Patient transported to CT

## 2024-11-26 NOTE — ED Provider Notes (Signed)
" °  Physical Exam   Vitals:   11/26/24 1426 11/26/24 1427 11/26/24 1445 11/26/24 1515  BP:   (!) 147/56 (!) 169/63  Pulse: 90 94 81 91  Resp: 13 14 11 13   Temp:      SpO2: (!) 88% 92% 93% 92%  Weight:      Height:         Physical Exam  Procedures  Procedures  ED Course / MDM   Clinical Course as of 11/26/24 1538  Sat Nov 26, 2024  1253 DG Chest Portable 1 View Abnormal chest x-ray --CT PE study pending [WF]    Clinical Course User Index [WF] Neldon Hamp RAMAN, [place]   Medical Decision Making Amount and/or Complexity of Data Reviewed Labs: ordered. Radiology: ordered. Decision-making details documented in ED Course. ECG/medicine tests: ordered.  Risk OTC drugs. Prescription drug management. Decision regarding hospitalization.   Patient received at shift change from prior EDP [facility], see their note for initial history/physical exam findings/lab and imaging interpretations/initial assessment and plan.  69 year old female presenting with shortness of breath/chest pain.  History of COPD, found to be hypoxic at 86% with EMS and placed on 2 L [place], Solu-Medrol /magnesium /Atrovent/albuterol  administered with EMS. On Xarelto .  Pertinent labs: CBC notable for leukocytosis of 16.7, hemoglobin of 9.3 is largely stable from her baseline CMP with mild hyponatremia with sodium of 131 stable from previous, creatinine 4.97 is stable given that patient has history of CKD/on dialysis, elevated anion gap at 16 BNP notably elevated at 13,902 Troponin elevated but flat, 124 and 137 respectively, no baseline for comparison Respiratory panel negative  CXR:  1. Pulmonary interstitial prominence with mild pulmonary edema and airspace opacity at the lung bases. 2. Right greater than left trace to small layering pleural effusions.  Concern for PE given patient's history, on Xarelto .  CT chest without contrast pending at time of shift change CT chest w/o contrast:  1. Cardiomegaly with  small bilateral pleural effusions. Partial consolidations within the bilateral lower lobes which may be due to atelectasis, pneumonia or aspiration. 2. Emphysema. 3. Mild mediastinal adenopathy, nonspecific, but favored to be reactive. 4. Aortic atherosclerosis.  At time of reassessment, patient notes improvement in her breathing, although she is still on 2 L [place].  While on oxygen during my assessment, patient is maintaining her oxygen saturation at 96 to 100%.  She is without wheezing during my assessment but does have trace crackles in the bases bilaterally, does not appear grossly fluid overloaded. Patient reports use of furosemide  at home, unsure of dose but reports 20mg . Will order 40mg  IV for initiation of diuresis.  Concern for possible PNA, Rocephin /Azithromycin  initiated.   I spoke with Dr. Verdene with the hospitalist service who is in agreement that this patient is appropriate for admission given acute hypoxic respiratory failure with new oxygen requirement, concern for CHF exacerbation and possible pneumonia       Alejandra Hernandez 11/26/24 1740    Ula Prentice SAUNDERS, MD 11/26/24 2256  "
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
## 2024-11-26 NOTE — ED Notes (Signed)
 Patient was given something to drink. She also spoke with her son over the phone.

## 2024-11-26 NOTE — ED Notes (Signed)
 Pt attempted to use bedside commode to obtain urine sample. Pt had bowel movement as well, no urine sample obtained.

## 2024-11-26 NOTE — ED Notes (Signed)
 CCMD Called

## 2024-11-26 NOTE — ED Notes (Signed)
"  Xray at bedside.   "

## 2024-11-26 NOTE — H&P (Signed)
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 clonidine .  Will at least resume this to avoid rebound hypertension.  History of DVT This was in 2018.  Xarelto  on hold for now due to concern for GI bleed.  Coronary artery disease Has a stent in her RCA.  Last cardiac catheterization was in  2022 which showed stable disease.  Resume carvedilol .  Diabetes mellitus type 2 She mentions that she is no longer on insulin  because her glucose levels were very low.  Will check HbA1c.  SSI.  Check CBGs.  Microcytic anemia Hemoglobin seems to be close to her baseline.  Will continue to monitor closely since she mentions melena. Recently done iron  panel shows ferritin of 413, iron  83, TIBC 197, percent saturation 42. She tells me that she has received blood transfusions due to anemia from her GI blood loss.  As recently as a few months ago.   DVT Prophylaxis: SCDs for now Code Status: Full code Family Communication: Discussed with patient Disposition: Return home when improved Consults called: None yet Admission Status: Inpatient    Severity of Illness: The appropriate patient status for this patient is INPATIENT. Inpatient status is  judged to be reasonable and necessary in order to provide the required intensity of service to ensure the patient's safety. The patient's presenting symptoms, physical exam findings, and initial radiographic and laboratory data in the context of their chronic comorbidities is felt to place them at high risk for further clinical deterioration. Furthermore, it is not anticipated that the patient will be medically stable for discharge from the hospital within 2 midnights of admission.   * I certify that at the point of admission it is my clinical judgment that the patient will require inpatient hospital care spanning beyond 2 midnights from the point of admission due to high intensity of service, high risk for further deterioration and high frequency of surveillance required.*   Further management decisions will depend on results of further testing and patient's response to treatment.   Myranda Pavone  [facility] Pager on [email or web address]  11/26/2024, 6:20 PM     [1]  Allergies Allergen Reactions   Lisinopril  Hives   Losartan  Other (See Comments)    Pt reports causes GI issues   Sodium Bicarbonate  Other (See Comments)    Pt reports causes GI issues   "

## 2024-11-26 NOTE — ED Notes (Signed)
 Assumed care of patient. Walked into room and patient was asleep. Patient reports pain 7/10 in her back.

## 2024-11-27 ENCOUNTER — Encounter (HOSPITAL_COMMUNITY): Payer: Self-pay | Admitting: Internal Medicine

## 2024-11-27 ENCOUNTER — Inpatient Hospital Stay (HOSPITAL_COMMUNITY)

## 2024-11-27 DIAGNOSIS — N185 Chronic kidney disease, stage 5: Secondary | ICD-10-CM

## 2024-11-27 DIAGNOSIS — I10 Essential (primary) hypertension: Secondary | ICD-10-CM | POA: Diagnosis not present

## 2024-11-27 DIAGNOSIS — J441 Chronic obstructive pulmonary disease with (acute) exacerbation: Secondary | ICD-10-CM | POA: Diagnosis not present

## 2024-11-27 DIAGNOSIS — R0603 Acute respiratory distress: Secondary | ICD-10-CM | POA: Diagnosis not present

## 2024-11-27 DIAGNOSIS — J9601 Acute respiratory failure with hypoxia: Secondary | ICD-10-CM | POA: Diagnosis not present

## 2024-11-27 LAB — CBC
HCT: 30.3 % — ABNORMAL LOW (ref 36.0–46.0)
Hemoglobin: 9.5 g/dL — ABNORMAL LOW (ref 12.0–15.0)
MCH: 22.4 pg — ABNORMAL LOW (ref 26.0–34.0)
MCHC: 31.4 g/dL (ref 30.0–36.0)
MCV: 71.3 fL — ABNORMAL LOW (ref 80.0–100.0)
Platelets: 558 K/uL — ABNORMAL HIGH (ref 150–400)
RBC: 4.25 MIL/uL (ref 3.87–5.11)
RDW: 22.1 % — ABNORMAL HIGH (ref 11.5–15.5)
WBC: 12.8 K/uL — ABNORMAL HIGH (ref 4.0–10.5)
nRBC: 0.2 % (ref 0.0–0.2)

## 2024-11-27 LAB — ECHOCARDIOGRAM COMPLETE
Area-P 1/2: 4.06 cm2
Calc EF: 59.4 %
Height: 63 in
MV M vel: 3.62 m/s
MV Peak grad: 52.4 mmHg
S' Lateral: 2.8 cm
Single Plane A2C EF: 61.7 %
Single Plane A4C EF: 59 %
Weight: 2064 [oz_av]

## 2024-11-27 LAB — COMPREHENSIVE METABOLIC PANEL WITH GFR
ALT: 37 U/L (ref 0–44)
AST: 24 U/L (ref 15–41)
Albumin: 3.2 g/dL — ABNORMAL LOW (ref 3.5–5.0)
Alkaline Phosphatase: 101 U/L (ref 38–126)
Anion gap: 18 — ABNORMAL HIGH (ref 5–15)
BUN: 70 mg/dL — ABNORMAL HIGH (ref 8–23)
CO2: 13 mmol/L — ABNORMAL LOW (ref 22–32)
Calcium: 8.5 mg/dL — ABNORMAL LOW (ref 8.9–10.3)
Chloride: 103 mmol/L (ref 98–111)
Creatinine, Ser: 5.28 mg/dL — ABNORMAL HIGH (ref 0.44–1.00)
GFR, Estimated: 8 mL/min — ABNORMAL LOW
Glucose, Bld: 277 mg/dL — ABNORMAL HIGH (ref 70–99)
Potassium: 4 mmol/L (ref 3.5–5.1)
Sodium: 134 mmol/L — ABNORMAL LOW (ref 135–145)
Total Bilirubin: 0.2 mg/dL (ref 0.0–1.2)
Total Protein: 6.7 g/dL (ref 6.5–8.1)

## 2024-11-27 LAB — HEMOGLOBIN A1C
Hgb A1c MFr Bld: 6.3 % — ABNORMAL HIGH (ref 4.8–5.6)
Mean Plasma Glucose: 134.11 mg/dL

## 2024-11-27 LAB — URINALYSIS, ROUTINE W REFLEX MICROSCOPIC
Bilirubin Urine: NEGATIVE
Glucose, UA: >=500 mg/dL — AB
Ketones, ur: NEGATIVE mg/dL
Leukocytes,Ua: NEGATIVE
Nitrite: NEGATIVE
Protein, ur: >=300 mg/dL — AB
Specific Gravity, Urine: 1.009 (ref 1.005–1.030)
pH: 6 (ref 5.0–8.0)

## 2024-11-27 LAB — HIV ANTIBODY (ROUTINE TESTING W REFLEX): HIV Screen 4th Generation wRfx: NONREACTIVE

## 2024-11-27 LAB — PROTIME-INR
INR: 1.4 — ABNORMAL HIGH (ref 0.8–1.2)
Prothrombin Time: 17.6 s — ABNORMAL HIGH (ref 11.4–15.2)

## 2024-11-27 LAB — CBG MONITORING, ED
Glucose-Capillary: 263 mg/dL — ABNORMAL HIGH (ref 70–99)
Glucose-Capillary: 228 mg/dL — ABNORMAL HIGH (ref 70–99)

## 2024-11-27 MED ORDER — ISOSORBIDE MONONITRATE ER 60 MG PO TB24
120.0000 mg | ORAL_TABLET | Freq: Every day | ORAL | Status: DC
  Administered 2024-11-27 – 2024-11-30 (×4): 120 mg via ORAL
  Filled 2024-11-27: qty 4
  Filled 2024-11-27 (×3): qty 2

## 2024-11-27 MED ORDER — FUROSEMIDE 10 MG/ML IJ SOLN
60.0000 mg | Freq: Three times a day (TID) | INTRAMUSCULAR | Status: DC
  Administered 2024-11-27 – 2024-11-29 (×5): 60 mg via INTRAVENOUS
  Filled 2024-11-27 (×5): qty 6

## 2024-11-27 MED ORDER — GABAPENTIN 300 MG PO CAPS
300.0000 mg | ORAL_CAPSULE | Freq: Two times a day (BID) | ORAL | Status: DC
  Administered 2024-11-27 – 2024-11-30 (×7): 300 mg via ORAL
  Filled 2024-11-27 (×7): qty 1

## 2024-11-27 MED ORDER — INSULIN GLARGINE-YFGN 100 UNIT/ML ~~LOC~~ SOLN
5.0000 [IU] | Freq: Every day | SUBCUTANEOUS | Status: DC
  Administered 2024-11-28 – 2024-11-30 (×3): 5 [IU] via SUBCUTANEOUS
  Filled 2024-11-27 (×3): qty 0.05

## 2024-11-27 MED ORDER — INFLUENZA VAC SPLIT HIGH-DOSE 0.5 ML IM SUSY
0.5000 mL | PREFILLED_SYRINGE | INTRAMUSCULAR | Status: AC
  Administered 2024-11-28: 0.5 mL via INTRAMUSCULAR
  Filled 2024-11-27: qty 0.5

## 2024-11-27 MED ORDER — PNEUMOCOCCAL 20-VAL CONJ VACC 0.5 ML IM SUSY
0.5000 mL | PREFILLED_SYRINGE | INTRAMUSCULAR | Status: DC
  Filled 2024-11-27: qty 0.5

## 2024-11-27 MED ORDER — AMITRIPTYLINE HCL 50 MG PO TABS
25.0000 mg | ORAL_TABLET | Freq: Every day | ORAL | Status: DC
  Administered 2024-11-27 – 2024-11-29 (×3): 25 mg via ORAL
  Filled 2024-11-27 (×3): qty 1

## 2024-11-27 MED ORDER — INSULIN GLARGINE 100 UNIT/ML ~~LOC~~ SOLN
5.0000 [IU] | Freq: Every day | SUBCUTANEOUS | Status: DC
  Administered 2024-11-27: 5 [IU] via SUBCUTANEOUS
  Filled 2024-11-27 (×2): qty 0.05

## 2024-11-27 MED ORDER — ROSUVASTATIN CALCIUM 5 MG PO TABS
10.0000 mg | ORAL_TABLET | Freq: Every day | ORAL | Status: DC
  Administered 2024-11-27 – 2024-11-30 (×4): 10 mg via ORAL
  Filled 2024-11-27: qty 1
  Filled 2024-11-27 (×3): qty 2

## 2024-11-27 NOTE — Consult Note (Signed)
 Please note that the [facility] nursing team is utilizing a standardized work plan to manage patient consults. We are triaging consults and will try to see the patients within 24 hours. Wound photos in the patient's chart allow us  to consult on the patient in the most efficient and timely manner.    Teiara Baria [facility], CNS, CWON-AP [phone]
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
## 2024-11-27 NOTE — ED Notes (Signed)
 RN called pharmacy for lantus  and have messaged pharmacy multiple times.

## 2024-11-27 NOTE — Consult Note (Signed)
 WOC Nurse Consult Note: Reason for Consult: ? right first toe wound  Wound type: no open wound Pressure Injury POA: NA No topical care needed Looks like scaring from wound previously Can protect with foam dressing if patient desires Discussed POC with patient and bedside nurse.   Re consult if needed, will not follow at this time. Thanks  Axl [employer], RN,CWOCN, CNS, [employer] [phone]

## 2024-11-27 NOTE — Progress Notes (Signed)
" °  Echocardiogram 2D Echocardiogram has been performed.  Koleen KANDICE Popper, RDCS 11/27/2024, 1:11 PM "
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
## 2024-11-27 NOTE — ED Notes (Signed)
 Patient is resting in bed.

## 2024-11-28 ENCOUNTER — Telehealth (HOSPITAL_COMMUNITY): Payer: Self-pay | Admitting: Pharmacy Technician

## 2024-11-28 DIAGNOSIS — J9601 Acute respiratory failure with hypoxia: Secondary | ICD-10-CM | POA: Diagnosis not present

## 2024-11-28 DIAGNOSIS — J441 Chronic obstructive pulmonary disease with (acute) exacerbation: Secondary | ICD-10-CM | POA: Diagnosis not present

## 2024-11-28 DIAGNOSIS — N185 Chronic kidney disease, stage 5: Secondary | ICD-10-CM | POA: Diagnosis not present

## 2024-11-28 LAB — GLUCOSE, CAPILLARY
Glucose-Capillary: 241 mg/dL — ABNORMAL HIGH (ref 70–99)
Glucose-Capillary: 190 mg/dL — ABNORMAL HIGH (ref 70–99)
Glucose-Capillary: 311 mg/dL — ABNORMAL HIGH (ref 70–99)
Glucose-Capillary: 132 mg/dL — ABNORMAL HIGH (ref 70–99)
Glucose-Capillary: 176 mg/dL — ABNORMAL HIGH (ref 70–99)
Glucose-Capillary: 258 mg/dL — ABNORMAL HIGH (ref 70–99)

## 2024-11-28 LAB — CBC
HCT: 25.7 % — ABNORMAL LOW (ref 36.0–46.0)
Hemoglobin: 8.2 g/dL — ABNORMAL LOW (ref 12.0–15.0)
MCH: 22.5 pg — ABNORMAL LOW (ref 26.0–34.0)
MCHC: 31.9 g/dL (ref 30.0–36.0)
MCV: 70.4 fL — ABNORMAL LOW (ref 80.0–100.0)
Platelets: 495 K/uL — ABNORMAL HIGH (ref 150–400)
RBC: 3.65 MIL/uL — ABNORMAL LOW (ref 3.87–5.11)
RDW: 20.7 % — ABNORMAL HIGH (ref 11.5–15.5)
WBC: 13.2 K/uL — ABNORMAL HIGH (ref 4.0–10.5)
nRBC: 1.3 % — ABNORMAL HIGH (ref 0.0–0.2)

## 2024-11-28 LAB — POCT HEMOGLOBIN-HEMACUE: Hemoglobin: 8.8 g/dL — ABNORMAL LOW (ref 12.0–15.0)

## 2024-11-28 LAB — RENAL FUNCTION PANEL
Albumin: 2.8 g/dL — ABNORMAL LOW (ref 3.5–5.0)
Anion gap: 13 (ref 5–15)
BUN: 72 mg/dL — ABNORMAL HIGH (ref 8–23)
CO2: 17 mmol/L — ABNORMAL LOW (ref 22–32)
Calcium: 8.3 mg/dL — ABNORMAL LOW (ref 8.9–10.3)
Chloride: 103 mmol/L (ref 98–111)
Creatinine, Ser: 5.13 mg/dL — ABNORMAL HIGH (ref 0.44–1.00)
GFR, Estimated: 9 mL/min — ABNORMAL LOW
Glucose, Bld: 192 mg/dL — ABNORMAL HIGH (ref 70–99)
Phosphorus: 6.1 mg/dL — ABNORMAL HIGH (ref 2.5–4.6)
Potassium: 4.1 mmol/L (ref 3.5–5.1)
Sodium: 133 mmol/L — ABNORMAL LOW (ref 135–145)

## 2024-11-28 MED ORDER — OXYCODONE-ACETAMINOPHEN 5-325 MG PO TABS
1.0000 | ORAL_TABLET | Freq: Four times a day (QID) | ORAL | Status: DC | PRN
  Administered 2024-11-28 – 2024-11-30 (×7): 1 via ORAL
  Filled 2024-11-28 (×7): qty 1

## 2024-11-28 MED ORDER — OXYCODONE-ACETAMINOPHEN 10-325 MG PO TABS: 1.0000 | ORAL_TABLET | Freq: Four times a day (QID) | ORAL | Status: DC | PRN

## 2024-11-28 MED ORDER — OXYCODONE HCL 5 MG PO TABS
5.0000 mg | ORAL_TABLET | Freq: Four times a day (QID) | ORAL | Status: DC | PRN
  Administered 2024-11-28 – 2024-11-30 (×7): 5 mg via ORAL
  Filled 2024-11-28 (×7): qty 1

## 2024-11-28 MED ORDER — PREDNISONE 20 MG PO TABS
40.0000 mg | ORAL_TABLET | Freq: Every day | ORAL | Status: DC
  Administered 2024-11-29 – 2024-11-30 (×2): 40 mg via ORAL
  Filled 2024-11-28 (×2): qty 2

## 2024-11-28 NOTE — Plan of Care (Signed)

## 2024-11-28 NOTE — Progress Notes (Signed)
SATURATION QUALIFICATIONS: (This note is used to comply with regulatory documentation for home oxygen)  Patient Saturations on Room Air at Rest = 84%  Patient Saturations on Room Air while Ambulating = 84%  Patient Saturations on 4 Liters of oxygen while Ambulating = 92%  Please briefly explain why patient needs home oxygen 

## 2024-11-28 NOTE — Plan of Care (Signed)
   Problem: Activity: Goal: Risk for activity intolerance will decrease Outcome: Progressing

## 2024-11-28 NOTE — Progress Notes (Signed)
 [facility] Progress Note   Assessment/ Plan:   Assessment/ Plan:   # AKI on CKD 5 - b/l creatinine 4.3- 4.6 from sept 2025, eGFR 11 ml/min - f/b Dr Dolan at [facility] - creatinine here 4.9 in setting of SOB, vol overload/ pulm edema - could be AKI due to vol overload vs progression to esrd  - CXR looks like vol overload/ pulm edema - continue IV Lasix  - may be developing uremic symptoms- will have to very carefully follow   # HTN - BP's 180/90 range, on the high side, but good for diuresis - can resume home meds gradually will diuresing  - try to avoid hypotension     # Volume - mild -mod tight edema in LE's and resp /CXR as above - looks like vol overload   # Anemia of ckd - Hb 9- 11 here, follow   # DM2 on insulin  - proteinuria on UA c/w diabetic kidney disease  Subjective:    Seen in room.  Responding to IV Lasix .  Looks like she has a little myoclonic jerking in her hands- discussed that this may be a sign of uremia, she says I'm not going to start dialysis now.     Objective:   BP (!) 178/62 (BP Location: Right Arm)   Pulse 74   Temp 98 F (36.7 C)   Resp 19   Ht 5' 3 (1.6 m)   Wt 60.7 kg   LMP  (LMP Unknown)   SpO2 100%   BMI 23.72 kg/m   Intake/Output Summary (Last 24 hours) at 11/28/2024 1650 Last data filed at 11/28/2024 1116 Gross per 24 hour  Intake 340 ml  Output 1350 ml  Net -1010 ml   Weight change: 2.223 kg  Physical Exam: Gen:NAD CVS RRR Resp bibasilar crackles Abd: soft, mildly distended Ext:no LE edema  Imaging: ECHOCARDIOGRAM COMPLETE Result Date: 11/27/2024    ECHOCARDIOGRAM REPORT   Patient Name:   Alejandra Hernandez Date of Exam: 11/27/2024 Medical Rec #:  [id number]      Height:       63.0 in Accession #:    [id number]     Weight:       129.0 lb Date of Birth:  [birth year]     BSA:          1.605 m Patient Age:    69 years       BP:           171/72 mmHg Patient Gender: F              HR:           73 bpm. Exam Location:   Inpatient Procedure: 2D Echo, 3D Echo, Cardiac Doppler and Color Doppler (Both Spectral            and Color Flow Doppler were utilized during procedure). Indications:    Dyspnea R06.00  History:        Patient has prior history of Echocardiogram examinations, most                 recent 10/13/2023. CHF, CAD, COPD and GERD,                 Signs/Symptoms:Dyspnea and Shortness of Breath; Risk                 Factors:Hypertension.  Sonographer:    Koleen Popper RDCS Referring Phys: JOETTE PEBBLES  Sonographer Comments: Image acquisition challenging due to patient body  habitus and Image acquisition challenging due to respiratory motion. IMPRESSIONS  1. Left ventricular ejection fraction, by estimation, is 60 to 65%. Left ventricular ejection fraction by 3D volume is 68 %. Left ventricular ejection fraction by 2D MOD biplane is 59.4 %. The left ventricle has normal function. The left ventricle has no regional wall motion abnormalities. Left ventricular diastolic parameters are consistent with Grade II diastolic dysfunction (pseudonormalization).  2. Right ventricular systolic function is normal. The right ventricular size is normal. There is mildly elevated pulmonary artery systolic pressure.  3. Left atrial size was mildly dilated.  4. The mitral valve is normal in structure. Mild mitral valve regurgitation. No evidence of mitral stenosis.  5. The aortic valve is tricuspid. Aortic valve regurgitation is not visualized. Aortic valve sclerosis/calcification is present, without any evidence of aortic stenosis.  6. The inferior vena cava is dilated in size with >50% respiratory variability, suggesting right atrial pressure of 8 mmHg. Comparison(s): Changes from prior study are noted. Improved LV systolic function. FINDINGS  Left Ventricle: Left ventricular ejection fraction, by estimation, is 60 to 65%. Left ventricular ejection fraction by 2D MOD biplane is 59.4 %. Left ventricular ejection fraction by 3D volume is 68 %.  The left ventricle has normal function. The left ventricle has no regional wall motion abnormalities. The left ventricular internal cavity size was normal in size. There is borderline concentric left ventricular hypertrophy. Left ventricular diastolic parameters are consistent with Grade II diastolic dysfunction (pseudonormalization). Right Ventricle: The right ventricular size is normal. No increase in right ventricular wall thickness. Right ventricular systolic function is normal. There is mildly elevated pulmonary artery systolic pressure. The tricuspid regurgitant velocity is 2.68  m/s, and with an assumed right atrial pressure of 8 mmHg, the estimated right ventricular systolic pressure is 36.7 mmHg. Left Atrium: Left atrial size was mildly dilated. Right Atrium: Right atrial size was normal in size. Pericardium: Trivial pericardial effusion is present. Mitral Valve: The mitral valve is normal in structure. Mild mitral valve regurgitation. No evidence of mitral valve stenosis. Tricuspid Valve: The tricuspid valve is normal in structure. Tricuspid valve regurgitation is trivial. No evidence of tricuspid stenosis. Aortic Valve: The aortic valve is tricuspid. Aortic valve regurgitation is not visualized. Aortic valve sclerosis/calcification is present, without any evidence of aortic stenosis. Pulmonic Valve: The pulmonic valve was normal in structure. Pulmonic valve regurgitation is trivial. No evidence of pulmonic stenosis. Aorta: The aortic root and ascending aorta are structurally normal, with no evidence of dilitation. Venous: The inferior vena cava is dilated in size with greater than 50% respiratory variability, suggesting right atrial pressure of 8 mmHg. IAS/Shunts: No atrial level shunt detected by color flow Doppler. Additional Comments: 3D was performed not requiring image post processing on an independent workstation and was normal.  LEFT VENTRICLE PLAX 2D                        Biplane EF (MOD) LVIDd:          4.50 cm         LV Biplane EF:   Left LVIDs:         2.80 cm                          ventricular LV PW:         1.10 cm  ejection LV IVS:        1.00 cm                          fraction by LVOT diam:     1.50 cm                          2D MOD LV SV:         49                               biplane is LV SV Index:   31                               59.4 %. LVOT Area:     1.77 cm                                Diastology                                LV e' medial:    4.68 cm/s LV Volumes (MOD)               LV E/e' medial:  23.3 LV vol d, MOD    120.0 ml      LV e' lateral:   6.25 cm/s A2C:                           LV E/e' lateral: 17.4 LV vol d, MOD    164.0 ml A4C: LV vol s, MOD    46.0 ml       3D Volume EF A2C:                           LV 3D EF:    Left LV vol s, MOD    67.2 ml                    ventricul A4C:                                        ar LV SV MOD A2C:   74.0 ml                    ejection LV SV MOD A4C:   164.0 ml                   fraction LV SV MOD BP:    84.6 ml                    by 3D                                             volume is  68 %.                                 3D Volume EF:                                3D EF:        68 %                                LV EDV:       101 ml                                LV ESV:       32 ml                                LV SV:        69 ml RIGHT VENTRICLE            IVC RV Basal diam:  3.70 cm    IVC diam: 2.60 cm RV S prime:     9.56 cm/s TAPSE (M-mode): 2.7 cm LEFT ATRIUM             Index        RIGHT ATRIUM           Index LA diam:        3.70 cm 2.31 cm/m   RA Area:     12.70 cm LA Vol (A2C):   50.0 ml 31.16 ml/m  RA Volume:   28.00 ml  17.45 ml/m LA Vol (A4C):   65.9 ml 41.06 ml/m LA Biplane Vol: 57.3 ml 35.70 ml/m  AORTIC VALVE LVOT Vmax:   123.00 cm/s LVOT Vmean:  80.400 cm/s LVOT VTI:    0.279 m  AORTA Ao Root diam: 3.30 cm Ao Asc diam:  2.80 cm  MITRAL VALVE                TRICUSPID VALVE MV Area (PHT): 4.06 cm     TR Peak grad:   28.7 mmHg MV Decel Time: 187 msec     TR Vmax:        268.00 cm/s MR Peak grad: 52.4 mmHg MR Vmax:      362.00 cm/s   SHUNTS MV E velocity: 109.00 cm/s  Systemic VTI:  0.28 m MV A velocity: 103.00 cm/s  Systemic Diam: 1.50 cm MV E/A ratio:  1.06 Georganna Archer Electronically signed by Georganna Archer Signature Date/Time: 11/27/2024/2:48:29 PM    Final    US  Abdomen Complete Result Date: 11/26/2024 EXAM: COMPLETE ABDOMINAL ULTRASOUND TECHNIQUE: Real-time ultrasonography of the abdomen was performed. COMPARISON: CT chest 11/26/2024 and CT abdomen and pelvis 05/18/2021. CLINICAL HISTORY: Distended abdomen. FINDINGS: LIVER: Normal echogenicity. No intrahepatic biliary ductal dilatation. No focal liver lesions. BILIARY SYSTEM: The gallbladder is not abnormally distended. Minimal pericholecystic edema is present without gallbladder wall thickening. This is likely reactive. Murphy sign is negative. No gallstones. Extrahepatic common bile duct measures 7 mm, upper limits of normal. KIDNEYS: Right renal length measures 9.1 cm. Left renal length measures 9.8 cm. Normal contour of kidneys. Normal cortical echogenicity. No hydronephrosis. No calculus. No mass. PANCREAS: Visualized portions of  the pancreas are unremarkable. SPLEEN: Spleen length measures 8.7 cm. No abnormality. VESSELS: Calcific atherosclerotic changes demonstrated in the abdominal aorta. AP diameter of the aorta is 2.2 cm. Visualized portion of the inferior vena cava is normal. Hepatopetal flow in the portal vein. OTHER: No ascites. Bilateral pleural effusions are identified. IMPRESSION: 1. No sonographic evidence of acute abdominal abnormality or biliary obstruction; minimal pericholecystic edema without cholelithiasis or gallbladder wall thickening, likely reactive. 2. Bilateral pleural effusions. Electronically signed by: Elsie Gravely MD 11/26/2024 09:23 PM EST RP  Workstation: [id number]    Labs: BMET Recent Labs  Lab 11/26/24 1220 11/26/24 1228 11/27/24 0433 11/28/24 0244  NA 131* 134* 134* 133*  K 3.9 3.8 4.0 4.1  CL 102 107 103 103  CO2 14*  --  13* 17*  GLUCOSE 161* 154* 277* 192*  BUN 64* 60* 70* 72*  CREATININE 4.97* 5.50* 5.28* 5.13*  CALCIUM  7.8*  --  8.5* 8.3*  PHOS  --   --   --  6.1*   CBC Recent Labs  Lab 11/26/24 1220 11/26/24 1228 11/27/24 0433 11/28/24 0244  WBC 16.7*  --  12.8* 13.2*  HGB 9.3* 10.9* 9.5* 8.2*  HCT 29.8* 32.0* 30.3* 25.7*  MCV 72.7*  --  71.3* 70.4*  PLT 541*  --  558* 495*    Medications:     amitriptyline   25 mg Oral QHS   azithromycin   500 mg Oral Daily   carvedilol   3.125 mg Oral BID WC   cloNIDine   0.1 mg Oral BID   furosemide   60 mg Intravenous Q8H   gabapentin   300 mg Oral BID   insulin  aspart  0-9 Units Subcutaneous TID WC   insulin  glargine-yfgn  5 Units Subcutaneous Daily   ipratropium-albuterol   3 mL Nebulization QID   isosorbide  mononitrate  120 mg Oral Daily   pantoprazole  (PROTONIX ) IV  40 mg Intravenous Q12H   pneumococcal 20-valent conjugate vaccine  0.5 mL Intramuscular Tomorrow-1000   [START ON 11/29/2024] predniSONE   40 mg Oral Q breakfast   rosuvastatin   10 mg Oral Daily   sodium bicarbonate   650 mg Oral BID    Almarie Bonine MD 11/28/2024, 4:50 PM

## 2024-11-28 NOTE — Telephone Encounter (Signed)
 Auth Submission: NO AUTH NEEDED Site of care: [facility] Payer: [employer] MEDICARE Medication & CPT/J Code(s) submitted:  V4893 - RETACRIT  NON-ESRD  Diagnosis Code: N18.4, D63.1 Route of submission (phone, fax, portal):  Phone # Fax # Auth type: Buy/Bill HB Units/visits requested: 20,000 UNITS EVERY 2 WEEKS Reference number: [id number] Approval from: 11/28/2024 to 11/23/25    Dagoberto Armour, CPhT [facility] Phone: [phone] 11/28/2024
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
## 2024-11-29 DIAGNOSIS — N185 Chronic kidney disease, stage 5: Secondary | ICD-10-CM | POA: Diagnosis not present

## 2024-11-29 DIAGNOSIS — J441 Chronic obstructive pulmonary disease with (acute) exacerbation: Secondary | ICD-10-CM | POA: Diagnosis not present

## 2024-11-29 DIAGNOSIS — J9601 Acute respiratory failure with hypoxia: Secondary | ICD-10-CM | POA: Diagnosis not present

## 2024-11-29 LAB — GLUCOSE, CAPILLARY
Glucose-Capillary: 205 mg/dL — ABNORMAL HIGH (ref 70–99)
Glucose-Capillary: 290 mg/dL — ABNORMAL HIGH (ref 70–99)
Glucose-Capillary: 294 mg/dL — ABNORMAL HIGH (ref 70–99)
Glucose-Capillary: 310 mg/dL — ABNORMAL HIGH (ref 70–99)
Glucose-Capillary: 403 mg/dL — ABNORMAL HIGH (ref 70–99)

## 2024-11-29 LAB — CBC
HCT: 25.5 % — ABNORMAL LOW (ref 36.0–46.0)
Hemoglobin: 8.2 g/dL — ABNORMAL LOW (ref 12.0–15.0)
MCH: 22.6 pg — ABNORMAL LOW (ref 26.0–34.0)
MCHC: 32.2 g/dL (ref 30.0–36.0)
MCV: 70.2 fL — ABNORMAL LOW (ref 80.0–100.0)
Platelets: 456 K/uL — ABNORMAL HIGH (ref 150–400)
RBC: 3.63 MIL/uL — ABNORMAL LOW (ref 3.87–5.11)
RDW: 20.7 % — ABNORMAL HIGH (ref 11.5–15.5)
WBC: 13.8 K/uL — ABNORMAL HIGH (ref 4.0–10.5)
nRBC: 3.5 % — ABNORMAL HIGH (ref 0.0–0.2)

## 2024-11-29 LAB — RENAL FUNCTION PANEL
Albumin: 2.9 g/dL — ABNORMAL LOW (ref 3.5–5.0)
Anion gap: 14 (ref 5–15)
BUN: 83 mg/dL — ABNORMAL HIGH (ref 8–23)
CO2: 18 mmol/L — ABNORMAL LOW (ref 22–32)
Calcium: 7.8 mg/dL — ABNORMAL LOW (ref 8.9–10.3)
Chloride: 103 mmol/L (ref 98–111)
Creatinine, Ser: 5.57 mg/dL — ABNORMAL HIGH (ref 0.44–1.00)
GFR, Estimated: 8 mL/min — ABNORMAL LOW
Glucose, Bld: 248 mg/dL — ABNORMAL HIGH (ref 70–99)
Phosphorus: 5.6 mg/dL — ABNORMAL HIGH (ref 2.5–4.6)
Potassium: 3.8 mmol/L (ref 3.5–5.1)
Sodium: 135 mmol/L (ref 135–145)

## 2024-11-29 MED ORDER — FUROSEMIDE 40 MG PO TABS
40.0000 mg | ORAL_TABLET | Freq: Two times a day (BID) | ORAL | Status: DC
  Administered 2024-11-29 – 2024-11-30 (×2): 40 mg via ORAL
  Filled 2024-11-29 (×2): qty 1

## 2024-11-29 MED ORDER — INSULIN ASPART 100 UNIT/ML IJ SOLN: 0.0000 [IU] | Freq: Three times a day (TID) | INTRAMUSCULAR | Status: DC

## 2024-11-29 MED ORDER — HYDRALAZINE HCL 20 MG/ML IJ SOLN
10.0000 mg | INTRAMUSCULAR | Status: AC
  Administered 2024-11-29: 10 mg via INTRAVENOUS

## 2024-11-29 MED ORDER — INSULIN ASPART 100 UNIT/ML IJ SOLN
0.0000 [IU] | Freq: Every day | INTRAMUSCULAR | Status: DC
  Administered 2024-11-29: 5 [IU] via SUBCUTANEOUS
  Filled 2024-11-29: qty 5

## 2024-11-29 NOTE — Progress Notes (Signed)
 [facility] Progress Note   Assessment/ Plan:   Assessment/ Plan:   # AKI on CKD 5 - b/l creatinine 4.3- 4.6 from sept 2025, eGFR 11 ml/min - f/b Dr Dolan at [facility] - creatinine here 4.9 in setting of SOB, vol overload/ pulm edema - could be AKI due to vol overload vs progression to esrd  - CXR looks like vol overload/ pulm edema - convert IV lasix  to PO  - pt reports home dose is 20 mg daily - if cr same or better tomorrow may be able to d/c   # HTN - BP's 180/90 range, on the high side, but good for diuresis - can resume home meds gradually will diuresing  - try to avoid hypotension     # Volume - mild -mod tight edema in LE's and resp /CXR as above - looks like vol overload   # Anemia of ckd - Hb 9- 11 here, follow   # DM2 on insulin  - proteinuria on UA c/w diabetic kidney disease  Subjective:    Seen in room- looks a lot better than yesterday.  No jerking/ shaking nearly at all today.  Says she thinks that albuterol  breathing treatments may have caused her to have some issues yesterday.  Eating lunch today- has a good appetite.   Objective:   BP (!) 165/56   Pulse 64   Temp (P) 98.4 F (36.9 C) (Oral)   Resp 19   Ht 5' 3 (1.6 m)   Wt 55.6 kg   LMP  (LMP Unknown)   SpO2 92%   BMI 21.71 kg/m   Intake/Output Summary (Last 24 hours) at 11/29/2024 1358 Last data filed at 11/29/2024 0000 Gross per 24 hour  Intake 480 ml  Output 1400 ml  Net -920 ml   Weight change: -5.137 kg  Physical Exam: Gen:NAD CVS RRR Resp much better air movement Abd: soft, mildly distended Ext:no LE edema  Imaging: No results found.   Labs: BMET Recent Labs  Lab 11/26/24 1220 11/26/24 1228 11/27/24 0433 11/28/24 0244 11/29/24 0510  NA 131* 134* 134* 133* 135  K 3.9 3.8 4.0 4.1 3.8  CL 102 107 103 103 103  CO2 14*  --  13* 17* 18*  GLUCOSE 161* 154* 277* 192* 248*  BUN 64* 60* 70* 72* 83*  CREATININE 4.97* 5.50* 5.28* 5.13* 5.57*  CALCIUM  7.8*  --   8.5* 8.3* 7.8*  PHOS  --   --   --  6.1* 5.6*   CBC Recent Labs  Lab 11/26/24 1220 11/26/24 1228 11/27/24 0433 11/28/24 0244 11/29/24 0510  WBC 16.7*  --  12.8* 13.2* 13.8*  HGB 9.3* 10.9* 9.5* 8.2* 8.2*  HCT 29.8* 32.0* 30.3* 25.7* 25.5*  MCV 72.7*  --  71.3* 70.4* 70.2*  PLT 541*  --  558* 495* 456*    Medications:     amitriptyline   25 mg Oral QHS   azithromycin   500 mg Oral Daily   carvedilol   3.125 mg Oral BID WC   cloNIDine   0.1 mg Oral BID   furosemide   40 mg Oral BID   gabapentin   300 mg Oral BID   insulin  aspart  0-9 Units Subcutaneous TID WC   insulin  glargine-yfgn  5 Units Subcutaneous Daily   ipratropium-albuterol   3 mL Nebulization QID   isosorbide  mononitrate  120 mg Oral Daily   pantoprazole  (PROTONIX ) IV  40 mg Intravenous Q12H   pneumococcal 20-valent conjugate vaccine  0.5 mL Intramuscular Tomorrow-1000  predniSONE   40 mg Oral Q breakfast   rosuvastatin   10 mg Oral Daily   sodium bicarbonate   650 mg Oral BID    Almarie Bonine MD 11/29/2024, 1:58 PM

## 2024-11-29 NOTE — Progress Notes (Signed)
 "  [facility] PROGRESS NOTE   Alejandra Hernandez FMW:[id number] DOB: [birth year] DOA: 11/26/2024  PCP: Norval Kettle, MD  Brief History: 68 y.o. female with a past medical history of essential hypertension, chronic kidney disease stage V, diabetes mellitus type 2, coronary artery disease, peripheral artery disease with stent placement, history of DVT in 2018 on Xarelto , right first toe wound followed by [facility], who was in her usual state of health till about 2 to 3 days ago when she started developing shortness of breath.  She also had chest pain in the center part of her chest.  Had some nausea but no vomiting.  Had dizzy spells but did not pass out.  She was wheezing since last night.  She had a dry cough.  Denies any fever chills.  No exposure to any sick contacts.  She mentioned that she does notice swelling in her legs at times but none currently.  Has not really noticed any change in her weight but feels a little bloated.  And for the past day or 2 she has noted black-colored stool.  She does have a history of GI bleed.  She is followed by gastroenterology in [facility].  When EMS arrived she was noted to be hypoxic.  She is currently on oxygen at 2 L by nasal cannula. In the emergency department she was given nebulizer treatments after which she started feeling better.  Still requiring oxygen.  So she will be hospitalized for further management.    Consultants: Nephrology  Procedures: None  Subjective/Interval History: Patient mentioned that she is feeling better though still short of breath with exertion.  Denies any chest pain nausea vomiting.  She was disappointed to hear that her creatinine was worse this morning.  She did not make much urine over the last 24 hours.  Has been very hesitant to consider dialysis.  She was encouraged to discuss this further with the nephrologist.  Assessment/Plan:  Acute Respiratory failure with hypoxia Likely multifactorial.  See  below. Continues to be on oxygen at 2 to 3 L/min by nasal cannula.  Sats noted to be in the early 90s.   Did have drop in oxygen saturations with ambulation.  Will most likely need home oxygen.  Will see how she does with diuresis.     COPD with acute exacerbation Patient continues to smoke.  She was counseled.  She was wheezing when she came into the emergency department.  She was given nebulizer treatments with improvement. COPD component has improved.  She still appears to be volume overloaded.  See below.  Steroids were changed to oral.  Plan a quick taper over 5 to 6 days.   Community-acquired pneumonia See above as well. Continue with ceftriaxone  and azithromycin .  No evidence for sepsis. Elevated WBC most likely due to steroids. She is noted to be afebrile.  5-day course of antibiotics.  Volume overload/chronic diastolic CHF She does have a history of diastolic CHF.  Last echocardiogram is from November 2024 which showed LVEF of 50 to 55%.  Grade 1 diastolic dysfunction was noted.  Right ventricular function was normal.   Echo was repeated and shows normal LVEF but grade 2 diastolic dysfunction was noted.  Right ventricular function was noted to be normal.  No significant valvular abnormalities noted.   Volume overload is most likely due to acute on chronic kidney disease.  See below    Acute on chronic kidney disease stage V/metabolic acidosis Creatinine noted to be 4.97.  Creatinine was 4.36 in September.  She is followed by Dr. Dolan with [facility]. CT chest apart from raising concern for infectious process also raised concern for volume overload.  She does not have significant pedal edema however she does have some abdominal distention. Ultrasound does not show any hydronephrosis.  UA reviewed.  Creatinine is higher than her baseline. Patient seen by nephrology.  Remains on IV furosemide  without much urine output.  Dialysis was discussed with the patient  yesterday by nephrology.  Patient was very hesitant.  Spoke to her this morning and asked her to reconsider especially in the setting of worsening renal function.  She will think about it and discussed with nephrology. Bicarbonate level has improved.  Continue sodium bicarbonate .   Abdominal distention She denies drinking alcohol .  No history of liver disease.  Abdominal ultrasound does not reveal any ascites.  LFTs unremarkable.  No further workup at this time.     Melena She reports black-colored stool for the past 1 to 2 days.  She has a history of same.  She is being followed by gastroenterology and [facility].   It looks like she underwent small bowel enteroscopy in August 2025.  Impression as below: The esophagus appeared normal.  Moderate friable mucosa with erosion in the body of the stomach and  antrum, suggestive of gastritis  The pylorus appeared normal.  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 No old blood or active bleeding was noted in the small  intestine.  FOBT however noted to be negative.  Does not appear to be actively bleeding.  Continue with PPI for now.  Currently no need to involve gastroenterology.  She may follow-up with her own gastroenterologist in [facility].  She will be talking to her doctor about getting referred to someone locally.   Peripheral artery disease She has multiple stents in her lower extremity.  According to angiogram done in June 2024 she was noted to have a patent common iliac artery stent with mild in-stent restenosis.  She has had angioplasties previously in 2019.   She is not noted to be on any antiplatelet agents but has been on Xarelto  which was held due to GI bleed.  Hemoglobin is  low but stable.  Hold Xarelto  in case she agrees to hemodialysis and needs dialysis catheter placement.   Essential hypertension Blood pressure remains elevated.   She is on carvedilol , nitrates and clonidine .  Hydralazine  as needed.  She is getting furosemide  intravenously.   Monitor blood pressure closely.  Not being very aggressive with blood pressure control due to acute component to her kidney disease.   History of DVT This was in 2018.  Xarelto  was initially held due to concern for GI bleed.  Now on hold in case HD catheter needs to be placed.   Coronary artery disease Has a stent in her RCA.  Last cardiac catheterization was in 2022 which showed stable disease.  Continue carvedilol .   Diabetes mellitus type 2 She mentions that she is no longer on insulin  because her glucose levels were very low.   CBGs have been high in the hospital probably due to steroids.  Continue SSI. HbA1c 6.3.   Continue low-dose glargine.  Anticipate glucose levels will improve as  steroid is tapered down.   Microcytic anemia Recently done iron  panel shows ferritin of 413, iron  83, TIBC 197, percent saturation 42. She tells me that she has received blood transfusions due to anemia from her GI blood loss.  As recently as a few months ago. Drop in hemoglobin noted.  However stable today.  Drop from 9.5 to 8.2 could be due to fluid shifts.  No overt bleeding.  DVT Prophylaxis: SCDs Code Status: Full code Family Communication: Discussed with patient Disposition Plan: Hopefully return home when improved.  Continue to mobilize as tolerated    Medications: Scheduled:  amitriptyline   25 mg Oral QHS   azithromycin   500 mg Oral Daily   carvedilol   3.125 mg Oral BID WC   cloNIDine   0.1 mg Oral BID   furosemide   60 mg Intravenous Q8H   gabapentin   300 mg Oral BID   insulin  aspart  0-9 Units Subcutaneous TID WC   insulin  glargine-yfgn  5 Units Subcutaneous Daily   ipratropium-albuterol   3 mL Nebulization QID    isosorbide  mononitrate  120 mg Oral Daily   pantoprazole  (PROTONIX ) IV  40 mg Intravenous Q12H   pneumococcal 20-valent conjugate vaccine  0.5 mL Intramuscular Tomorrow-1000   predniSONE   40 mg Oral Q breakfast   rosuvastatin   10 mg Oral Daily   sodium bicarbonate   650 mg Oral BID   Continuous:  cefTRIAXone  (ROCEPHIN )  IV 1 g (11/28/24 1724)   PRN:acetaminophen  **OR** acetaminophen , albuterol , hydrALAZINE , ondansetron  **OR** ondansetron  (ZOFRAN ) IV, oxyCODONE -acetaminophen  **AND** oxyCODONE   Antibiotics: Anti-infectives (From admission, onward)    Start     Dose/Rate Route Frequency Ordered Stop   11/27/24 1700  cefTRIAXone  (ROCEPHIN ) 1 g in sodium chloride  0.9 % 100 mL IVPB        1 g 200 mL/hr over 30 Minutes Intravenous Every 24 hours 11/26/24 1838 12/01/24 1659   11/27/24 1700  azithromycin  (ZITHROMAX ) tablet 500 mg        500 mg Oral Daily 11/26/24 1838 12/01/24 0959   11/26/24 1715  cefTRIAXone  (ROCEPHIN ) 1 g in sodium chloride  0.9 % 100 mL IVPB        1 g 200 mL/hr over 30 Minutes Intravenous  Once 11/26/24 1700 11/26/24 1822   11/26/24 1715  azithromycin  (ZITHROMAX ) 500 mg in sodium chloride  0.9 % 250 mL IVPB        500 mg 250 mL/hr over 60 Minutes Intravenous  Once 11/26/24 1700 11/26/24 1951       Objective:  Vital Signs  Vitals:   11/29/24 0022 11/29/24 0454 11/29/24 0750 11/29/24 0813  BP: (!) 184/70 (!) 165/69 (!) 176/67   Pulse: 74 73 75   Resp: 18 19 19    Temp: 98.6 F (37 C) 98.5 F (36.9 C) 98.1 F (36.7 C)   TempSrc: Oral Oral    SpO2: 93% 93% 93% 95%  Weight:  55.6 kg    Height:        Intake/Output Summary (Last 24 hours) at 11/29/2024 1000 Last data filed at 11/29/2024 0000 Gross per 24 hour  Intake 480 ml  Output 1800 ml  Net -1320 ml   Filed Weights   11/27/24 1944 11/28/24 0431 11/29/24 0454  Weight: 60.7 kg 60.7 kg 55.6 kg    General appearance: Awake alert.  In no distress Resp: mildly tachypneic without use of accessory muscles.   Diminished air entry at the bases with few crackles. Cardio: S1-S2 is normal regular.  No S3-S4.  No rubs murmurs or  bruit GI: Abdomen is soft.  Nontender nondistended.  Bowel sounds are present normal.  No masses organomegaly Extremities: No edema.  Full range of motion of lower extremities. Neurologic: Alert and oriented x3.  No focal neurological deficits.     Lab Results:  Data Reviewed: I have personally reviewed following labs and reports of the imaging studies  CBC: Recent Labs  Lab 11/26/24 1220 11/26/24 1228 11/27/24 0433 11/28/24 0244 11/29/24 0510  WBC 16.7*  --  12.8* 13.2* 13.8*  HGB 9.3* 10.9* 9.5* 8.2* 8.2*  HCT 29.8* 32.0* 30.3* 25.7* 25.5*  MCV 72.7*  --  71.3* 70.4* 70.2*  PLT 541*  --  558* 495* 456*    Basic Metabolic Panel: Recent Labs  Lab 11/26/24 1220 11/26/24 1228 11/27/24 0433 11/28/24 0244 11/29/24 0510  NA 131* 134* 134* 133* 135  K 3.9 3.8 4.0 4.1 3.8  CL 102 107 103 103 103  CO2 14*  --  13* 17* 18*  GLUCOSE 161* 154* 277* 192* 248*  BUN 64* 60* 70* 72* 83*  CREATININE 4.97* 5.50* 5.28* 5.13* 5.57*  CALCIUM  7.8*  --  8.5* 8.3* 7.8*  PHOS  --   --   --  6.1* 5.6*    GFR: Estimated Creatinine Clearance: 8 mL/min (A) (by C-G formula based on SCr of 5.57 mg/dL (H)).  Liver Function Tests: Recent Labs  Lab 11/26/24 1220 11/27/24 0433 11/28/24 0244 11/29/24 0510  AST 48* 24  --   --   ALT 46* 37  --   --   ALKPHOS 96 101  --   --   BILITOT 0.2 0.2  --   --   PROT 6.3* 6.7  --   --   ALBUMIN  3.2* 3.2* 2.8* 2.9*     Coagulation Profile: Recent Labs  Lab 11/27/24 0433  INR 1.4*   BNP (last 3 results) Recent Labs    11/26/24 1220  PROBNP 13,902.0*    HbA1C: Recent Labs    11/27/24 0433  HGBA1C 6.3*    CBG: Recent Labs  Lab 11/28/24 0739 11/28/24 1137 11/28/24 1542 11/28/24 2124 11/29/24 0712  GLUCAP 311* 132* 176* 258* 310*     Recent Results (from the past 240 hours)  Resp panel by RT-PCR (RSV, Flu  A&B, Covid) Anterior Nasal Swab     Status: None   Collection Time: 11/26/24  1:35 PM   Specimen: Anterior Nasal Swab  Result Value Ref Range Status   SARS Coronavirus 2 by RT PCR NEGATIVE NEGATIVE Final   Influenza A by PCR NEGATIVE NEGATIVE Final   Influenza B by PCR NEGATIVE NEGATIVE Final    Comment: (NOTE) The Xpert Xpress SARS-CoV-2/FLU/RSV plus assay is intended as an aid in the diagnosis of influenza from Nasopharyngeal swab specimens and should not be used as a sole basis for treatment. Nasal washings and aspirates are unacceptable for Xpert Xpress SARS-CoV-2/FLU/RSV testing.  Fact Sheet for Patients: bloggercourse.com  Fact Sheet for Healthcare Providers: seriousbroker.it  This test is not yet approved or cleared by the [place]  FDA and has been authorized for detection and/or diagnosis of SARS-CoV-2 by FDA under an Emergency Use Authorization (EUA). This EUA will remain in effect (meaning this test can be used) for the duration of the COVID-19 declaration under Section 564(b)(1) of the Act, 21 U.S.C. section 360bbb-3(b)(1), unless the authorization is terminated or revoked.     Resp Syncytial Virus by PCR NEGATIVE NEGATIVE Final    Comment: (NOTE) Fact Sheet for Patients:  bloggercourse.com  Fact Sheet for Healthcare Providers: seriousbroker.it  This test is not yet approved or cleared by the [place]  FDA and has been authorized for detection and/or diagnosis of SARS-CoV-2 by FDA under an Emergency Use Authorization (EUA). This EUA will remain in effect (meaning this test can be used) for the duration of the COVID-19 declaration under Section 564(b)(1) of the Act, 21 U.S.C. section 360bbb-3(b)(1), unless the authorization is terminated or revoked.  Performed at [facility], [address], [place], [place] [address]       Radiology  Studies: ECHOCARDIOGRAM COMPLETE Result Date: 11/27/2024    ECHOCARDIOGRAM REPORT   Patient Name:   Alejandra Hernandez Date of Exam: 11/27/2024 Medical Rec #:  [id number]      Height:       63.0 in Accession #:    [id number]     Weight:       129.0 lb Date of Birth:  Jun 29, 1956     BSA:          1.605 m Patient Age:    68 years       BP:           171/72 mmHg Patient Gender: F              HR:           73 bpm. Exam Location:  Inpatient Procedure: 2D Echo, 3D Echo, Cardiac Doppler and Color Doppler (Both Spectral            and Color Flow Doppler were utilized during procedure). Indications:    Dyspnea R06.00  History:        Patient has prior history of Echocardiogram examinations, most                 recent 10/13/2023. CHF, CAD, COPD and GERD,                 Signs/Symptoms:Dyspnea and Shortness of Breath; Risk                 Factors:Hypertension.  Sonographer:    Koleen Popper RDCS Referring Phys: JOETTE PEBBLES  Sonographer Comments: Image acquisition challenging due to patient body habitus and Image acquisition challenging due to respiratory motion. IMPRESSIONS  1. Left ventricular ejection fraction, by estimation, is 60 to 65%. Left ventricular ejection fraction by 3D volume is 68 %. Left ventricular ejection fraction by 2D MOD biplane is 59.4 %. The left ventricle has normal function. The left ventricle has no regional wall motion abnormalities. Left ventricular diastolic parameters are consistent with Grade II diastolic dysfunction (pseudonormalization).  2. Right ventricular systolic function is normal. The right ventricular size is normal. There is mildly elevated pulmonary artery systolic pressure.  3. Left atrial size was mildly dilated.  4. The mitral valve is normal in structure. Mild mitral valve regurgitation. No evidence of mitral stenosis.  5. The aortic valve is tricuspid. Aortic valve regurgitation is not visualized. Aortic valve sclerosis/calcification is present, without any evidence of aortic  stenosis.  6. The inferior vena cava is dilated in size with >50% respiratory variability, suggesting right atrial pressure of 8 mmHg. Comparison(s): Changes from prior study are noted. Improved LV systolic function. FINDINGS  Left Ventricle: Left ventricular ejection fraction, by estimation, is 60 to 65%. Left ventricular ejection fraction by 2D MOD biplane is 59.4 %. Left ventricular ejection fraction by 3D volume is 68 %. The left ventricle has normal function. The left ventricle has no regional  wall motion abnormalities. The left ventricular internal cavity size was normal in size. There is borderline concentric left ventricular hypertrophy. Left ventricular diastolic parameters are consistent with Grade II diastolic dysfunction (pseudonormalization). Right Ventricle: The right ventricular size is normal. No increase in right ventricular wall thickness. Right ventricular systolic function is normal. There is mildly elevated pulmonary artery systolic pressure. The tricuspid regurgitant velocity is 2.68  m/s, and with an assumed right atrial pressure of 8 mmHg, the estimated right ventricular systolic pressure is 36.7 mmHg. Left Atrium: Left atrial size was mildly dilated. Right Atrium: Right atrial size was normal in size. Pericardium: Trivial pericardial effusion is present. Mitral Valve: The mitral valve is normal in structure. Mild mitral valve regurgitation. No evidence of mitral valve stenosis. Tricuspid Valve: The tricuspid valve is normal in structure. Tricuspid valve regurgitation is trivial. No evidence of tricuspid stenosis. Aortic Valve: The aortic valve is tricuspid. Aortic valve regurgitation is not visualized. Aortic valve sclerosis/calcification is present, without any evidence of aortic stenosis. Pulmonic Valve: The pulmonic valve was normal in structure. Pulmonic valve regurgitation is trivial. No evidence of pulmonic stenosis. Aorta: The aortic root and ascending aorta are structurally normal,  with no evidence of dilitation. Venous: The inferior vena cava is dilated in size with greater than 50% respiratory variability, suggesting right atrial pressure of 8 mmHg. IAS/Shunts: No atrial level shunt detected by color flow Doppler. Additional Comments: 3D was performed not requiring image post processing on an independent workstation and was normal.  LEFT VENTRICLE PLAX 2D                        Biplane EF (MOD) LVIDd:         4.50 cm         LV Biplane EF:   Left LVIDs:         2.80 cm                          ventricular LV PW:         1.10 cm                          ejection LV IVS:        1.00 cm                          fraction by LVOT diam:     1.50 cm                          2D MOD LV SV:         49                               biplane is LV SV Index:   31                               59.4 %. LVOT Area:     1.77 cm                                Diastology  LV e' medial:    4.68 cm/s LV Volumes (MOD)               LV E/e' medial:  23.3 LV vol d, MOD    120.0 ml      LV e' lateral:   6.25 cm/s A2C:                           LV E/e' lateral: 17.4 LV vol d, MOD    164.0 ml A4C: LV vol s, MOD    46.0 ml       3D Volume EF A2C:                           LV 3D EF:    Left LV vol s, MOD    67.2 ml                    ventricul A4C:                                        ar LV SV MOD A2C:   74.0 ml                    ejection LV SV MOD A4C:   164.0 ml                   fraction LV SV MOD BP:    84.6 ml                    by 3D                                             volume is                                             68 %.                                 3D Volume EF:                                3D EF:        68 %                                LV EDV:       101 ml                                LV ESV:       32 ml                                LV SV:        69 ml RIGHT VENTRICLE  IVC RV Basal diam:  3.70 cm    IVC diam: 2.60 cm RV S prime:     9.56 cm/s  TAPSE (M-mode): 2.7 cm LEFT ATRIUM             Index        RIGHT ATRIUM           Index LA diam:        3.70 cm 2.31 cm/m   RA Area:     12.70 cm LA Vol (A2C):   50.0 ml 31.16 ml/m  RA Volume:   28.00 ml  17.45 ml/m LA Vol (A4C):   65.9 ml 41.06 ml/m LA Biplane Vol: 57.3 ml 35.70 ml/m  AORTIC VALVE LVOT Vmax:   123.00 cm/s LVOT Vmean:  80.400 cm/s LVOT VTI:    0.279 m  AORTA Ao Root diam: 3.30 cm Ao Asc diam:  2.80 cm MITRAL VALVE                TRICUSPID VALVE MV Area (PHT): 4.06 cm     TR Peak grad:   28.7 mmHg MV Decel Time: 187 msec     TR Vmax:        268.00 cm/s MR Peak grad: 52.4 mmHg MR Vmax:      362.00 cm/s   SHUNTS MV E velocity: 109.00 cm/s  Systemic VTI:  0.28 m MV A velocity: 103.00 cm/s  Systemic Diam: 1.50 cm MV E/A ratio:  1.06 Georganna Archer Electronically signed by Georganna Archer Signature Date/Time: 11/27/2024/2:48:29 PM    Final        LOS: 3 days   Joette Pebbles  [facility] Pager on [email or web address]  11/29/2024, 10:00 AM   "

## 2024-11-29 NOTE — Plan of Care (Signed)
   Problem: Education: Goal: Ability to describe self-care measures that may prevent or decrease complications (Diabetes Survival Skills Education) will improve Outcome: Progressing

## 2024-11-29 NOTE — Progress Notes (Signed)
 Patient HS blood sugar 402, no HS insulin  coverage ordered.  Dr. Lee made aware, order for 5 units of novolog  now, repeat blood sugar at [facility].

## 2024-11-29 NOTE — Inpatient Diabetes Management (Signed)
 Inpatient Diabetes Program Recommendations  AACE/ADA: New Consensus Statement on Inpatient Glycemic Control (2015)  Target Ranges:  Prepandial:   less than 140 mg/dL      Peak postprandial:   less than 180 mg/dL (1-2 hours)      Critically ill patients:  140 - 180 mg/dL   Lab Results  Component Value Date   GLUCAP 205 (H) 11/29/2024   HGBA1C 6.3 (H) 11/27/2024    Review of Glycemic Control  Latest Reference Range & Units 11/28/24 07:39 11/28/24 11:37 11/28/24 15:42 11/28/24 21:24 11/29/24 07:12 11/29/24 11:33  Glucose-Capillary 70 - 99 mg/dL 688 (H) 867 (H) 823 (H) 258 (H) 310 (H) 205 (H)   Diabetes history: DM 2 Outpatient Diabetes medications: Tresiba  14 units, humalog  1-10 units tid Current orders for Inpatient glycemic control:  Semglee  5 units Daily Novolog  0-9 units tid PO prednisone  40 mg Daily  Inpatient Diabetes Program Recommendations:    While pts on steroids consider: -   May consider Novolog  2 units tid if eating >50% of meals -   Add Novolog  hs scale  Thanks, Clotilda Bull RN, MSN, BC-ADM Inpatient Diabetes Coordinator Team Pager [phone] (8a-5p)

## 2024-11-29 NOTE — Progress Notes (Signed)
 Patient BP 188/74 at 2100, patient aysmptomatic, no CP, SOB, or lightheadedness.  10 mg hydralazine  given per PRN orders.  Repeat BP at 2215 178/68.  Dr Sundil made aware, order received for additional 10 mg hydralazine  IV.  Administered and BP to be rechecked in 2 hours.

## 2024-11-30 ENCOUNTER — Encounter: Payer: Self-pay | Admitting: Hematology and Oncology

## 2024-11-30 ENCOUNTER — Encounter (HOSPITAL_COMMUNITY): Payer: Self-pay | Admitting: Nephrology

## 2024-11-30 ENCOUNTER — Telehealth (HOSPITAL_COMMUNITY): Payer: Self-pay

## 2024-11-30 ENCOUNTER — Other Ambulatory Visit (HOSPITAL_COMMUNITY): Payer: Self-pay

## 2024-11-30 DIAGNOSIS — I5032 Chronic diastolic (congestive) heart failure: Secondary | ICD-10-CM

## 2024-11-30 DIAGNOSIS — I2510 Atherosclerotic heart disease of native coronary artery without angina pectoris: Secondary | ICD-10-CM

## 2024-11-30 DIAGNOSIS — S91101A Unspecified open wound of right great toe without damage to nail, initial encounter: Secondary | ICD-10-CM

## 2024-11-30 DIAGNOSIS — I2583 Coronary atherosclerosis due to lipid rich plaque: Secondary | ICD-10-CM

## 2024-11-30 DIAGNOSIS — Z86718 Personal history of other venous thrombosis and embolism: Secondary | ICD-10-CM

## 2024-11-30 LAB — RENAL FUNCTION PANEL
Albumin: 3.1 g/dL — ABNORMAL LOW (ref 3.5–5.0)
Anion gap: 17 — ABNORMAL HIGH (ref 5–15)
BUN: 85 mg/dL — ABNORMAL HIGH (ref 8–23)
CO2: 17 mmol/L — ABNORMAL LOW (ref 22–32)
Calcium: 7.9 mg/dL — ABNORMAL LOW (ref 8.9–10.3)
Chloride: 100 mmol/L (ref 98–111)
Creatinine, Ser: 5.43 mg/dL — ABNORMAL HIGH (ref 0.44–1.00)
GFR, Estimated: 8 mL/min — ABNORMAL LOW
Glucose, Bld: 218 mg/dL — ABNORMAL HIGH (ref 70–99)
Phosphorus: 5.5 mg/dL — ABNORMAL HIGH (ref 2.5–4.6)
Potassium: 3.8 mmol/L (ref 3.5–5.1)
Sodium: 134 mmol/L — ABNORMAL LOW (ref 135–145)

## 2024-11-30 LAB — CBC
HCT: 28.9 % — ABNORMAL LOW (ref 36.0–46.0)
Hemoglobin: 9.2 g/dL — ABNORMAL LOW (ref 12.0–15.0)
MCH: 22.5 pg — ABNORMAL LOW (ref 26.0–34.0)
MCHC: 31.8 g/dL (ref 30.0–36.0)
MCV: 70.8 fL — ABNORMAL LOW (ref 80.0–100.0)
Platelets: 512 K/uL — ABNORMAL HIGH (ref 150–400)
RBC: 4.08 MIL/uL (ref 3.87–5.11)
RDW: 21.3 % — ABNORMAL HIGH (ref 11.5–15.5)
WBC: 14.4 K/uL — ABNORMAL HIGH (ref 4.0–10.5)
nRBC: 2.4 % — ABNORMAL HIGH (ref 0.0–0.2)

## 2024-11-30 LAB — HEPATIC FUNCTION PANEL
ALT: 136 U/L — ABNORMAL HIGH (ref 0–44)
AST: 73 U/L — ABNORMAL HIGH (ref 15–41)
Albumin: 3.1 g/dL — ABNORMAL LOW (ref 3.5–5.0)
Alkaline Phosphatase: 106 U/L (ref 38–126)
Bilirubin, Direct: 0.1 mg/dL (ref 0.0–0.2)
Indirect Bilirubin: 0.1 mg/dL — ABNORMAL LOW (ref 0.3–0.9)
Total Bilirubin: 0.3 mg/dL (ref 0.0–1.2)
Total Protein: 5.9 g/dL — ABNORMAL LOW (ref 6.5–8.1)

## 2024-11-30 LAB — GLUCOSE, CAPILLARY
Glucose-Capillary: 267 mg/dL — ABNORMAL HIGH (ref 70–99)
Glucose-Capillary: 300 mg/dL — ABNORMAL HIGH (ref 70–99)

## 2024-11-30 MED ORDER — SODIUM BICARBONATE 650 MG PO TABS
650.0000 mg | ORAL_TABLET | Freq: Two times a day (BID) | ORAL | 0 refills | Status: AC
  Filled 2024-11-30: qty 60, 30d supply, fill #0

## 2024-11-30 MED ORDER — IPRATROPIUM-ALBUTEROL 0.5-2.5 (3) MG/3ML IN SOLN: 3.0000 mL | Freq: Two times a day (BID) | RESPIRATORY_TRACT | Status: DC

## 2024-11-30 MED ORDER — GABAPENTIN 300 MG PO CAPS: 300.0000 mg | ORAL_CAPSULE | Freq: Every day | ORAL | Status: AC

## 2024-11-30 MED ORDER — APIXABAN 2.5 MG PO TABS
2.5000 mg | ORAL_TABLET | Freq: Two times a day (BID) | ORAL | Status: DC
  Administered 2024-11-30: 2.5 mg via ORAL
  Filled 2024-11-30: qty 1

## 2024-11-30 MED ORDER — PANTOPRAZOLE SODIUM 40 MG PO TBEC: 40.0000 mg | DELAYED_RELEASE_TABLET | Freq: Two times a day (BID) | ORAL | Status: DC

## 2024-11-30 MED ORDER — INSULIN GLARGINE-YFGN 100 UNIT/ML ~~LOC~~ SOLN: 8.0000 [IU] | Freq: Every day | SUBCUTANEOUS | Status: DC

## 2024-11-30 MED ORDER — CEFDINIR 300 MG PO CAPS: 300.0000 mg | ORAL_CAPSULE | Freq: Every day | ORAL | 0 refills | Status: DC

## 2024-11-30 MED ORDER — ROSUVASTATIN CALCIUM 10 MG PO TABS
10.0000 mg | ORAL_TABLET | Freq: Every day | ORAL | 0 refills | Status: AC
  Filled 2024-11-30: qty 30, 30d supply, fill #0

## 2024-11-30 MED ORDER — FUROSEMIDE 40 MG PO TABS
40.0000 mg | ORAL_TABLET | Freq: Two times a day (BID) | ORAL | 0 refills | Status: AC
  Filled 2024-11-30: qty 60, 30d supply, fill #0

## 2024-11-30 MED ORDER — ONDANSETRON HCL 4 MG PO TABS
4.0000 mg | ORAL_TABLET | Freq: Four times a day (QID) | ORAL | 0 refills | Status: AC | PRN
  Filled 2024-11-30: qty 20, 5d supply, fill #0

## 2024-11-30 MED ORDER — FUROSEMIDE 40 MG PO TABS: 40.0000 mg | ORAL_TABLET | Freq: Two times a day (BID) | ORAL | 0 refills | Status: DC

## 2024-11-30 MED ORDER — SODIUM BICARBONATE 650 MG PO TABS: 650.0000 mg | ORAL_TABLET | Freq: Two times a day (BID) | ORAL | 0 refills | Status: DC

## 2024-11-30 MED ORDER — APIXABAN 2.5 MG PO TABS
2.5000 mg | ORAL_TABLET | Freq: Two times a day (BID) | ORAL | 0 refills | Status: AC
  Filled 2024-11-30 (×2): qty 60, 30d supply, fill #0

## 2024-11-30 MED ORDER — ROSUVASTATIN CALCIUM 10 MG PO TABS: 10.0000 mg | ORAL_TABLET | Freq: Every day | ORAL | 0 refills | Status: DC

## 2024-11-30 MED ORDER — PREDNISONE 10 MG (21) PO TBPK: ORAL_TABLET | ORAL | 0 refills | Status: DC

## 2024-11-30 MED ORDER — CARVEDILOL 3.125 MG PO TABS: 3.1250 mg | ORAL_TABLET | Freq: Two times a day (BID) | ORAL | 0 refills | Status: DC

## 2024-11-30 MED ORDER — CEFDINIR 300 MG PO CAPS
300.0000 mg | ORAL_CAPSULE | Freq: Every day | ORAL | 0 refills | Status: AC
  Filled 2024-11-30: qty 3, 3d supply, fill #0

## 2024-11-30 MED ORDER — DOXYCYCLINE HYCLATE 100 MG PO TABS: 100.0000 mg | ORAL_TABLET | Freq: Two times a day (BID) | ORAL | 0 refills | Status: DC

## 2024-11-30 MED ORDER — DOXYCYCLINE HYCLATE 100 MG PO TABS
100.0000 mg | ORAL_TABLET | Freq: Two times a day (BID) | ORAL | Status: DC
  Administered 2024-11-30: 100 mg via ORAL
  Filled 2024-11-30: qty 1

## 2024-11-30 MED ORDER — PREDNISONE 10 MG (21) PO TBPK
ORAL_TABLET | ORAL | 0 refills | Status: DC
  Filled 2024-11-30: qty 21, 6d supply, fill #0

## 2024-11-30 MED ORDER — CARVEDILOL 3.125 MG PO TABS
3.1250 mg | ORAL_TABLET | Freq: Two times a day (BID) | ORAL | 0 refills | Status: AC
  Filled 2024-11-30: qty 60, 30d supply, fill #0

## 2024-11-30 MED ORDER — DOXYCYCLINE HYCLATE 100 MG PO TABS
100.0000 mg | ORAL_TABLET | Freq: Two times a day (BID) | ORAL | 0 refills | Status: AC
  Filled 2024-11-30 (×2): qty 10, 5d supply, fill #0

## 2024-11-30 MED ORDER — APIXABAN 2.5 MG PO TABS: 2.5000 mg | ORAL_TABLET | Freq: Two times a day (BID) | ORAL | 0 refills | Status: DC

## 2024-11-30 MED ORDER — ONDANSETRON HCL 4 MG PO TABS: 4.0000 mg | ORAL_TABLET | Freq: Four times a day (QID) | ORAL | 0 refills | Status: DC | PRN

## 2024-11-30 NOTE — Evaluation (Signed)
 Occupational Therapy Evaluation and Discharge Patient Details Name: Alejandra Hernandez MRN: [id number] DOB: 1956-01-30 Today's Date: 11/30/2024   History of Present Illness   The pt is a 69 yo female presenting 1/3 with SOB. Admitted for management of COPD and CHF exacerbations, work up also showed CAP. PMH includes: COPD, CKD on HD, DVT, CAD, NSTEMI, DM II, GIB on Xarelto .     Clinical Impressions Pt is typically mod I for mobility with SPC and ADL with DME. Today Pt presents with decreased activity tolerance and new O2 needs during activity - but able to complete ADL at baseline including transfers, LB dressing, standing grooming at sink, simulated peri care. Pt educated on energy conservation strategies during ADL. Recommend Pt work with mobility team for activity tolerance and OT will sign off at this time.      If plan is discharge home, recommend the following:   A little help with walking and/or transfers;A little help with bathing/dressing/bathroom;Assist for transportation     Functional Status Assessment   Patient has had a recent decline in their functional status and demonstrates the ability to make significant improvements in function in a reasonable and predictable amount of time.     Equipment Recommendations   None recommended by OT (Pt has appropriate DME)     Recommendations for Other Services   PT consult     Precautions/Restrictions   Precautions Precautions: Fall Recall of Precautions/Restrictions: Intact Precaution/Restrictions Comments: watch O2, up to 1L with mobility Required Braces or Orthoses: Other Brace Other Brace: post op show R foot Restrictions Weight Bearing Restrictions Per Provider Order: No     Mobility Bed Mobility Overal bed mobility: Independent             General bed mobility comments: slightly increased time, no assist    Transfers Overall transfer level: Needs assistance Equipment used: Straight cane Transfers:  Sit to/from Stand Sit to Stand: Contact guard assist           General transfer comment: CGA with cane, minimal sway, SpO2 92% on RA      Balance Overall balance assessment: Mild deficits observed, not formally tested                                         ADL either performed or assessed with clinical judgement   ADL Overall ADL's : At baseline                                       General ADL Comments: Pt able to demonstrate UB and LB ADL, transfers for toileting, peri care, sink level grooming, self-feeding. Verbally edcuated in energy conservation strategies     Vision Patient Visual Report: No change from baseline Vision Assessment?: No apparent visual deficits     Perception         Praxis         Pertinent Vitals/Pain Pain Assessment Pain Assessment: Faces Faces Pain Scale: Hurts a little bit Pain Location: low back, chronic Pain Descriptors / Indicators: Discomfort     Extremity/Trunk Assessment Upper Extremity Assessment Upper Extremity Assessment: Overall WFL for tasks assessed;Generalized weakness (Pt wears glove on L hand for pain from dialysis access)   Lower Extremity Assessment Lower Extremity Assessment: Defer to PT evaluation   Cervical / Trunk  Assessment Cervical / Trunk Assessment: Normal   Communication Communication Communication: No apparent difficulties   Cognition Arousal: Alert Behavior During Therapy: WFL for tasks assessed/performed Cognition: No apparent impairments             OT - Cognition Comments: seems WFL, pleasant, cooperative, joking, baseline STM                 Following commands: Intact       Cueing  General Comments   Cueing Techniques: Verbal cues  SpO2 to 87% on RA with walking, 92% with 1L   Exercises     Shoulder Instructions      Home Living Family/patient expects to be discharged to:: Private residence Living Arrangements: Other  relatives Available Help at Discharge: Family;Available PRN/intermittently (son homescool during day (51), cousin) Type of Home: House Home Access: Stairs to enter Secretary/administrator of Steps: 3 Entrance Stairs-Rails: Right;Left Home Layout: One level     Bathroom Shower/Tub: Chief Strategy Officer: Standard Bathroom Accessibility: Yes How Accessible: Accessible via walker Home Equipment: Shower seat;Cane - single point;Rolling Walker (2 wheels)   Additional Comments: uses shower chair during bathing      Prior Functioning/Environment Prior Level of Function : Independent/Modified Independent;Driving             Mobility Comments: independent with cane, no falls ADLs Comments: independent, driving, does her own meds    OT Problem List: Decreased activity tolerance;Cardiopulmonary status limiting activity;Pain   OT Treatment/Interventions:        OT Goals(Current goals can be found in the care plan section)   Acute Rehab OT Goals Patient Stated Goal: get home, breathe better OT Goal Formulation: With patient Time For Goal Achievement: 12/14/24 Potential to Achieve Goals: Good   OT Frequency:       Co-evaluation              AM-PAC OT 6 Clicks Daily Activity     Outcome Measure Help from another person eating meals?: None Help from another person taking care of personal grooming?: A Little Help from another person toileting, which includes using toliet, bedpan, or urinal?: A Little Help from another person bathing (including washing, rinsing, drying)?: A Little Help from another person to put on and taking off regular upper body clothing?: None Help from another person to put on and taking off regular lower body clothing?: A Little 6 Click Score: 20   End of Session Equipment Utilized During Treatment: Gait belt;Oxygen (1L during activity, RA at rest) Nurse Communication: Mobility status;Precautions  Activity Tolerance: Patient  tolerated treatment well Patient left: in chair;with call bell/phone within reach  OT Visit Diagnosis: Other abnormalities of gait and mobility (R26.89);Muscle weakness (generalized) (M62.81);Other (comment) (cardiopulmonary status limiting activity)                Time: 8867-8852 OT Time Calculation (min): 15 min Charges:  OT General Charges $OT Visit: 1 Visit OT Evaluation $OT Eval Low Complexity: 1 Low  Leita DEL OTR/L Acute Rehabilitation Services Office: [phone]  Leita PARAS [facility] 11/30/2024, 12:17 PM

## 2024-11-30 NOTE — Inpatient Diabetes Management (Signed)
 Inpatient Diabetes Program Recommendations  AACE/ADA: New Consensus Statement on Inpatient Glycemic Control (2015)  Target Ranges:  Prepandial:   less than 140 mg/dL      Peak postprandial:   less than 180 mg/dL (1-2 hours)      Critically ill patients:  140 - 180 mg/dL   Lab Results  Component Value Date   GLUCAP 267 (H) 11/30/2024   HGBA1C 6.3 (H) 11/27/2024    Review of Glycemic Control  Latest Reference Range & Units 11/29/24 07:12 11/29/24 11:33 11/29/24 15:38 11/29/24 21:21 11/29/24 23:58 11/30/24 07:37  Glucose-Capillary 70 - 99 mg/dL 689 (H) 794 (H) 705 (H) 403 (H) 290 (H) 267 (H)   Diabetes history: DM 2 Outpatient Diabetes medications: Tresiba  14 units, humalog  1-10 units tid Current orders for Inpatient glycemic control:  Semglee  5 units Daily Novolog  0-9 units tid PO prednisone  40 mg Daily  Inpatient Diabetes Program Recommendations:    While pts on steroids consider: -   May consider Novolog  2 units tid if eating >50% of meals -   Increase Semglee  to 8 units  Thanks, Clotilda Bull RN, MSN, BC-ADM Inpatient Diabetes Coordinator Team Pager [phone] (8a-5p)

## 2024-11-30 NOTE — Evaluation (Signed)
 Physical Therapy Evaluation Patient Details Name: Alejandra Hernandez MRN: [id number] DOB: 06-02-1956 Today's Date: 11/30/2024  History of Present Illness  The pt is a 69 yo female presenting 1/3 with SOB. Admitted for management of COPD and CHF exacerbations, work up also showed CAP. PMH includes: COPD, CKD on HD, DVT, CAD, NSTEMI, DM II, GIB on Xarelto .  Clinical Impression  Pt in bed upon arrival of PT, agreeable to evaluation at this time. Prior to admission the pt was ambulating with use of a cane, but reports largely sedentary lifestyle with everything arranged to be a really short distance for her to manage in the home. The pt was able to complete sit-stand transfers and ambulation without physical assistance, but does use cane and ambulate with short, slow strides with dependence on UE support for balance. SpO2 to 87% on RA with gait, but low of 92% on 1L. Will continue to follow acutely to progress functional endurance, but do not anticipate follow up needs after d/c. Pt educated on progressive walking program and use of O2 with exertion at home, pt verbalized understanding.      If plan is discharge home, recommend the following: Help with stairs or ramp for entrance   Can travel by private vehicle        Equipment Recommendations None recommended by PT (1L O2)  Recommendations for Other Services       Functional Status Assessment Patient has had a recent decline in their functional status and demonstrates the ability to make significant improvements in function in a reasonable and predictable amount of time.     Precautions / Restrictions Precautions Precautions: Fall Recall of Precautions/Restrictions: Impaired Precaution/Restrictions Comments: watch O2, up to 1L with mobility Restrictions Weight Bearing Restrictions Per Provider Order: No      Mobility  Bed Mobility Overal bed mobility: Independent             General bed mobility comments: slightly increased time,  no assist    Transfers Overall transfer level: Needs assistance Equipment used: Straight cane Transfers: Sit to/from Stand Sit to Stand: Contact guard assist           General transfer comment: CGA with cane, minimal sway, SpO2 92% on RA    Ambulation/Gait Ambulation/Gait assistance: Contact guard assist Gait Distance (Feet): 35 Feet (+ 15 ft) Assistive device: Straight cane Gait Pattern/deviations: Step-through pattern, Decreased stride length, Trunk flexed Gait velocity: decreased Gait velocity interpretation: <1.31 ft/sec, indicative of household ambulator   General Gait Details: pt with slight trunk flexion, slowed gait but reports this is her baseline. reports this is longer ambulation than she does at home. SpO2 to 87% on RA with gait, 92% on 1L     Balance Overall balance assessment: Mild deficits observed, not formally tested                                           Pertinent Vitals/Pain Pain Assessment Pain Assessment: Faces Pain Score: 8  Faces Pain Scale: Hurts a little bit Pain Location: low back, chronic Pain Descriptors / Indicators: Discomfort    Home Living Family/patient expects to be discharged to:: Private residence Living Arrangements: Other relatives Available Help at Discharge: Family;Available PRN/intermittently (son homescool during day (49), cousin) Type of Home: House Home Access: Stairs to enter Entrance Stairs-Rails: Doctor, General Practice of Steps: 3   Home Layout: One level Home  Equipment: Shower seat;Cane - single Librarian, Academic (2 wheels)      Prior Function Prior Level of Function : Independent/Modified Independent;Driving             Mobility Comments: independent with cane, no falls ADLs Comments: independent, driving, does her own meds     Extremity/Trunk Assessment   Upper Extremity Assessment Upper Extremity Assessment: Defer to OT evaluation    Lower Extremity  Assessment Lower Extremity Assessment: Overall WFL for tasks assessed    Cervical / Trunk Assessment Cervical / Trunk Assessment: Normal  Communication   Communication Communication: No apparent difficulties    Cognition Arousal: Alert Behavior During Therapy: WFL for tasks assessed/performed   PT - Cognitive impairments: No apparent impairments                         Following commands: Intact       Cueing Cueing Techniques: Verbal cues     General Comments General comments (skin integrity, edema, etc.): SpO2 to 87% on RA with walking, 92% with 1L    Exercises     Assessment/Plan    PT Assessment Patient needs continued PT services  PT Problem List Cardiopulmonary status limiting activity;Decreased activity tolerance       PT Treatment Interventions Gait training;Stair training;Therapeutic exercise    PT Goals (Current goals can be found in the Care Plan section)  Acute Rehab PT Goals Patient Stated Goal: to return home PT Goal Formulation: With patient Time For Goal Achievement: 12/14/24 Potential to Achieve Goals: Good    Frequency Min 1X/week        AM-PAC PT 6 Clicks Mobility  Outcome Measure Help needed turning from your back to your side while in a flat bed without using bedrails?: None Help needed moving from lying on your back to sitting on the side of a flat bed without using bedrails?: None Help needed moving to and from a bed to a chair (including a wheelchair)?: None Help needed standing up from a chair using your arms (e.g., wheelchair or bedside chair)?: None Help needed to walk in hospital room?: A Little Help needed climbing 3-5 steps with a railing? : A Little 6 Click Score: 22    End of Session Equipment Utilized During Treatment: Gait belt;Oxygen Activity Tolerance: Patient tolerated treatment well Patient left:  (standing in room with OT) Nurse Communication: Mobility status PT Visit Diagnosis: Unsteadiness on feet  (R26.81)    Time: 8884-8866 PT Time Calculation (min) (ACUTE ONLY): 18 min   Charges:   PT Evaluation $PT Eval Low Complexity: 1 Low   PT General Charges $$ ACUTE PT VISIT: 1 Visit         Izetta Call, PT, DPT   Acute Rehabilitation Department Office [phone] Secure Chat Communication Preferred  Izetta JULIANNA Call 11/30/2024, 12:02 PM

## 2024-11-30 NOTE — Progress Notes (Signed)
 [facility] Progress Note   Assessment/ Plan:   Assessment/ Plan:   # AKI on CKD 5 - b/l creatinine 4.3- 4.6 from sept 2025, eGFR 11 ml/min - f/b Dr Dolan at [facility] - creatinine here 4.9 in setting of SOB, vol overload/ pulm edema - could be AKI due to vol overload vs progression to esrd  - CXR looks like vol overload/ pulm edema - send out on Lasix  40 mg PO BID - close followup Monday with Dr Dolan.   - Has AVG- doesn't have a great bruit   # HTN - BP's 180/90 range, on the high side, but good for diuresis - can resume home meds gradually will diuresing  - try to avoid hypotension     # Volume - mild -mod tight edema in LE's and resp /CXR as above - looks like vol overload   # Anemia of ckd - Hb 9- 11 here, follow   # DM2 on insulin  - proteinuria on UA c/w diabetic kidney disease  Subjective:    Transitioned to oral Lasix .  Cr slightly better.  Looks good.  Has OP appt with Dr Dolan Monday at 1:10 pm.  OK from renal perspective to go.   Objective:   BP (!) 166/56 (BP Location: Right Arm)   Pulse 78   Temp 98 F (36.7 C) (Oral)   Resp 18   Ht 5' 3 (1.6 m)   Wt 56.4 kg   LMP  (LMP Unknown)   SpO2 98%   BMI 22.03 kg/m   Intake/Output Summary (Last 24 hours) at 11/30/2024 1443 Last data filed at 11/29/2024 2027 Gross per 24 hour  Intake 240 ml  Output 1400 ml  Net -1160 ml   Weight change: 0.8 kg  Physical Exam: Gen:NAD CVS RRR Resp much better air movement Abd: soft, mildly distended Ext:no LE edema ACCESS: LAVG- not a great bruit  Imaging: No results found.   Labs: BMET Recent Labs  Lab 11/26/24 1220 11/26/24 1228 11/27/24 0433 11/28/24 0244 11/29/24 0510 11/30/24 0511  NA 131* 134* 134* 133* 135 134*  K 3.9 3.8 4.0 4.1 3.8 3.8  CL 102 107 103 103 103 100  CO2 14*  --  13* 17* 18* 17*  GLUCOSE 161* 154* 277* 192* 248* 218*  BUN 64* 60* 70* 72* 83* 85*  CREATININE 4.97* 5.50* 5.28* 5.13* 5.57* 5.43*  CALCIUM  7.8*   --  8.5* 8.3* 7.8* 7.9*  PHOS  --   --   --  6.1* 5.6* 5.5*   CBC Recent Labs  Lab 11/27/24 0433 11/28/24 0244 11/29/24 0510 11/30/24 0511  WBC 12.8* 13.2* 13.8* 14.4*  HGB 9.5* 8.2* 8.2* 9.2*  HCT 30.3* 25.7* 25.5* 28.9*  MCV 71.3* 70.4* 70.2* 70.8*  PLT 558* 495* 456* 512*    Medications:     amitriptyline   25 mg Oral QHS   carvedilol   3.125 mg Oral BID WC   cloNIDine   0.1 mg Oral BID   doxycycline   100 mg Oral Q12H   furosemide   40 mg Oral BID   gabapentin   300 mg Oral BID   insulin  aspart  0-5 Units Subcutaneous QHS   insulin  aspart  0-9 Units Subcutaneous TID WC   [START ON 12/01/2024] insulin  glargine-yfgn  8 Units Subcutaneous Daily   ipratropium-albuterol   3 mL Nebulization BID   isosorbide  mononitrate  120 mg Oral Daily   pantoprazole   40 mg Oral BID   pneumococcal 20-valent conjugate vaccine  0.5 mL Intramuscular  Tomorrow-1000   predniSONE   40 mg Oral Q breakfast   rosuvastatin   10 mg Oral Daily   sodium bicarbonate   650 mg Oral BID    Almarie Bonine MD 11/30/2024, 2:43 PM

## 2024-11-30 NOTE — Discharge Instructions (Signed)
 Information on my medicine - ELIQUIS  (apixaban )  This medication education was reviewed with me or my healthcare representative as part of my discharge preparation.    Why was Eliquis  prescribed for you? Eliquis  was prescribed to prevent blood clots in the veins of your legs (deep vein thrombosis) or in your lungs (pulmonary embolism) because you have a history of blood clots.  THIS MEDICATION REPLACES THE XARELTO  (Rivaroxaban ) YOU WERE TAKING.  What do You need to know about Eliquis  ? The dose is 2.5mg  taken TWICE daily.  Eliquis  may be taken with or without food.   Try to take the dose about the same time in the morning and in the evening. If you have difficulty swallowing the tablet whole please discuss with your pharmacist how to take the medication safely.  Take Eliquis  exactly as prescribed and DO NOT stop taking Eliquis  without talking to the doctor who prescribed the medication.  Stopping may increase your risk of developing a new blood clot.  Refill your prescription before you run out.  After discharge, you should have regular check-up appointments with your healthcare provider that is prescribing your Eliquis .    What do you do if you miss a dose? If a dose of ELIQUIS  is not taken at the scheduled time, take it as soon as possible on the same day and twice-daily administration should be resumed. The dose should not be doubled to make up for a missed dose.  Important Safety Information A possible side effect of Eliquis  is bleeding. You should call your healthcare provider right away if you experience any of the following: Bleeding from an injury or your nose that does not stop. Unusual colored urine (red or dark brown) or unusual colored stools (red or black). Unusual bruising for unknown reasons. A serious fall or if you hit your head (even if there is no bleeding).  Some medicines may interact with Eliquis  and might increase your risk of bleeding or clotting while on  Eliquis . To help avoid this, consult your healthcare provider or pharmacist prior to using any new prescription or non-prescription medications, including herbals, vitamins, non-steroidal anti-inflammatory drugs (NSAIDs) and supplements.  This website has more information on Eliquis  (apixaban ): [email or web address] .[email or web address] dena

## 2024-11-30 NOTE — Discharge Summary (Signed)
 " Physician Discharge Summary   Patient: Alejandra Hernandez MRN: [id number] DOB: 1956-09-23  Admit date:     11/26/2024  Discharge date: 11/30/2024  Discharge Physician: Alejandro Marker, DO   PCP: Norval Kettle, MD   Recommendations at discharge:   Follow-up with PCP within 1 to 2 weeks repeat CBC, CMP, mag, Phos within 1 week; follow LFTs in outpatient setting if remains elevated recommend outpatient further workup Follow-up with Nephrology on 12/05/2024 at 1:10 PM with Dr. Dolan Follow-up with PCP within 1 to 2 weeks and has appointment on Friday, 12/02/2024 Follow-up with podiatry in outpatient setting and has appointment on 12/01/2024 Follow-up with outpatient gastroenterology within 1 to 2 weeks  Discharge Diagnoses: Principal Problem:   COPD with acute exacerbation (HCC) Active Problems:   Essential hypertension   PAD (peripheral artery disease)   Chronic diastolic CHF (congestive heart failure) (HCC)   History of DVT (deep vein thrombosis)   CAD (coronary artery disease)   CKD (chronic kidney disease) stage 5, GFR less than 15 ml/min (HCC)   Melena   Open wound of right great toe  Resolved Problems:   * No resolved hospital problems. [facility] Course: The patient is 69 y.o. female with a past medical history of essential hypertension, chronic kidney disease stage V, diabetes mellitus type 2, coronary artery disease, peripheral artery disease with stent placement, history of DVT in 2018 on Xarelto , right first toe wound followed by [facility], who was in her usual state of health till about 2 to 3 days ago when she started developing shortness of breath. She also had chest pain in the center part of her chest. Had some nausea but no vomiting. Had dizzy spells but did not pass out. She was wheezing since last night. She had a dry cough. Denies any fever chills. No exposure to any sick contacts. She mentioned that she does notice swelling in her legs at times but none currently. Has not  really noticed any change in her weight but feels a little bloated. And for the past day or 2 she has noted black-colored stool. She does have a history of GI bleed. She is followed by gastroenterology in [facility]. When EMS arrived she was noted to be hypoxic. She is currently on oxygen at 2 L by nasal cannula. In the emergency department she was given nebulizer treatments after which she started feeling better. Still requiring oxygen. So she will be hospitalized for further management.   She is clearly improved and nephrology was consulted and cleared her for discharge home.  She needs to follow up with PCP, Podiatry, and Nephrology within 1 to 2 weeks.  Assessment and Plan:  Acute Respiratory failure with hypoxia Likely multifactorial.  See below. Continues to be on oxygen at 2 to 3 L/min by nasal cannula.  Sats noted to be in the early 90s.   Diuresed and did not need supplemental oxygen on repeat home amatory O2 screen and stable for discharge.   COPD with acute exacerbation Patient continues to smoke.  She was counseled.  She was wheezing when she came into the emergency department.  She was given nebulizer treatments with improvement. COPD component has improved.  She still appears to be volume overloaded.  See below.  Steroids were changed to oral.  Plan a quick taper over 5 to 6 days.  Discharged on a Sterapred taper and oral antibiotics.   Community-acquired pneumonia See above as well. Continue with ceftriaxone  and azithromycin  and  changed to p.o.  No evidence for sepsis. Elevated WBC most likely due to steroids. She is noted to be afebrile.  5-day course of antibiotics.   Volume overload/chronic diastolic CHF She does have a history of diastolic CHF.  Last echocardiogram is from November 2024 which showed LVEF of 50 to 55%.  Grade 1 diastolic dysfunction was noted.  Right ventricular function was normal.   Echo was repeated and shows normal LVEF but grade 2 diastolic  dysfunction was noted.  Right ventricular function was noted to be normal.  No significant valvular abnormalities noted.   Volume overload is most likely due to acute on chronic kidney disease.  See below    Acute on chronic kidney disease stage V/metabolic acidosis Creatinine noted to be 4.97.  Creatinine was 4.36 in September.  She is followed by Dr. Dolan with [facility] -BUN/Cr Trend: Recent Labs  Lab 11/26/24 1220 11/26/24 1228 11/27/24 0433 11/28/24 0244 11/29/24 0510 11/30/24 0511  BUN 64* 60* 70* 72* 83* 85*  CREATININE 4.97* 5.50* 5.28* 5.13* 5.57* 5.43*  -Avoid Nephrotoxic Medications, Contrast Dyes, Hypotension and Dehydration to Ensure Adequate Renal Perfusion and will need to Renally Adjust Meds -Continue to Monitor and Trend Renal Function  -CT chest apart from raising concern for infectious process also raised concern for volume overload.  She does not have significant pedal edema however she does have some abdominal distention. Ultrasound does not show any hydronephrosis.  UA reviewed.  Creatinine is higher than her baseline. Patient seen by nephrology.  Remains on IV furosemide  without much urine output but was changed to oral diuresis per nephrology.  Dialysis was discussed with the patient yesterday by nephrology.  Patient was very hesitant.  Spoke to her this morning and asked her to reconsider especially in the setting of worsening renal function.  She will think about it and discussed with nephrology. Bicarbonate level has improved.  Continue sodium bicarbonate . -Nephrology has cleared her for discharge.   Abdominal distention She denies drinking alcohol .  No history of liver disease.  Abdominal ultrasound does not reveal any ascites.  LFTs unremarkable.  No further workup at this time.  Follow-up in outpatient setting   Melena She reports black-colored stool for the past 1 to 2 days.  She has a history of same.  She is being followed by  gastroenterology and [facility].   It looks like she underwent small bowel enteroscopy in August 2025.  Impression as below: The esophagus appeared normal.  Moderate friable mucosa with erosion in the body of the stomach and  antrum, suggestive of gastritis  The pylorus appeared normal.  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 No old blood or active bleeding was noted in the small  intestine.  FOBT however noted to be negative.  Does not appear to be actively bleeding.  Continue with PPI for now.  Currently no need to involve gastroenterology.  She may follow-up with her own gastroenterologist in [facility].  She will be talking to her doctor about getting referred to someone locally and this can be done in outpatient setting   Peripheral Artery Disease She has multiple stents in her lower extremity.  According to angiogram done in June 2024 she was noted to have a  patent common iliac artery stent with mild in-stent restenosis.  She has had angioplasties previously in 2019.   She is not noted to be on any antiplatelet agents but has been on Xarelto  which was held due to GI bleed.  Hemoglobin is low but stable.  Held Xarelto  in case she agrees to hemodialysis and needs dialysis catheter placement however this is not necessary and now will be changed to p.o. apixaban    Essential Hypertension: Blood pressure remains elevated.   She is on carvedilol , nitrates and clonidine .  Hydralazine  as needed.  She is getting furosemide  intravenously.   Monitor blood pressure closely.  Not being very aggressive with blood pressure control due to acute component to her kidney disease. Follow-up with PCP within 1 weeks-   History of DVT This was in  2018.  Xarelto  was initially held due to concern for GI bleed.  Now on hold in case HD catheter needs to be placed.  Will be resuming apixaban  per the nephrology recommendations   Coronary Artery Disease: Has a stent in her RCA.  Last cardiac catheterization was in 2022 which showed stable disease.  Continue carvedilol .   Diabetes mellitus type 2 She mentions that she is no longer on insulin  because her glucose levels were very low.   CBGs have been high in the hospital probably due to steroids.  Continue SSI. HbA1c 6.3.   Continue low-dose glargine.  Anticipate glucose levels will improve as steroid is tapered down. Recent Labs  Lab 11/29/24 0712 11/29/24 1133 11/29/24 1538 11/29/24 2121 11/29/24 2358 11/30/24 0737 11/30/24 1142  GLUCAP 310* 205* 294* 403* 290* 267* 300*   Abnormal LFTs:  Recent Labs  Lab 11/26/24 1220 11/27/24 0433 11/30/24 0511  AST 48* 24 73*  ALT 46* 37 136*  -Follow up in the outpatient setting and Complete U/S done and showed No sonographic evidence of acute abdominal abnormality or biliary obstruction; minimal pericholecystic edema without cholelithiasis or gallbladder wall thickening, likely reactive. Bilateral pleural effusions.  Microcytic Anemia Recently done iron  panel shows ferritin of 413, iron  83, TIBC 197, percent saturation 42. She tells me that she has received blood transfusions due to anemia from her GI blood loss.  As recently as a few months ago. Hgb/Hct Trend:  Recent Labs  Lab 11/25/24 1313 11/26/24 1220 11/26/24 1220 11/26/24 1228 11/27/24 0433 11/28/24 0244 11/29/24 0510 11/30/24 0511  HGB 8.8* 9.3*  --  10.9* 9.5* 8.2* 8.2* 9.2*  HCT  --  29.8*   < > 32.0* 30.3* 25.7* 25.5* 28.9*  MCV  --  72.7*   < >  --  71.3* 70.4* 70.2* 70.8*   < > = values in this interval not displayed.  -Follow up in the outpatient setting and repeat CBC within 1-2 weeks    Hypoalbuminemia: Patient's Albumin  Trend: Recent Labs  Lab 11/26/24 1220  11/27/24 0433 11/28/24 0244 11/29/24 0510 11/30/24 0511  ALBUMIN  3.2* 3.2* 2.8* 2.9* 3.1*  3.1*  -Continue to Monitor and Trend and repeat CMP in the AM  Consultants: Nephrology Procedures performed: As delineated as above   Disposition: Home health Diet recommendation:  Discharge Diet Orders (From admission, onward)     Start     Ordered   11/30/24 0000  Diet renal/carb modified with fluid restriction       Comments: 1200 mL Fluid Restriction   11/30/24 1455           Renal diet DISCHARGE MEDICATION: Allergies as of 11/30/2024  Reactions   Lisinopril  Hives   Losartan  Other (See Comments)   Pt reports causes GI issues   Sodium Bicarbonate  Other (See Comments)   Pt reports causes GI issues        Medication List     STOP taking these medications    amLODipine  5 MG tablet Commonly known as: NORVASC    Xarelto  15 MG Tabs tablet Generic drug: Rivaroxaban        TAKE these medications    Accu-Chek Guide Me w/Device Kit 4 (four) times daily. for testing   acetaminophen  500 MG tablet Commonly known as: TYLENOL  Take 500-1,000 mg by mouth every 6 (six) hours as needed (pain.).   albuterol  108 (90 Base) MCG/ACT inhaler Commonly known as: ProAir  HFA Inhale 2 puffs into the lungs every 4 (four) hours as needed for wheezing or shortness of breath.   amitriptyline  25 MG tablet Commonly known as: ELAVIL  Take 25 mg by mouth at bedtime.   carvedilol  3.125 MG tablet Commonly known as: COREG  Take 1 tablet (3.125 mg total) by mouth 2 (two) times daily with a meal. What changed:  medication strength See the new instructions.   cefdinir  300 MG capsule Commonly known as: OMNICEF  Take 1 capsule (300 mg total) by mouth daily for 3 days.   cholecalciferol 25 MCG (1000 UNIT) tablet Commonly known as: VITAMIN D3 Take 1,000 Units by mouth in the morning.   cloNIDine  0.1 MG tablet Commonly known as: CATAPRES  Take 1 tablet (0.1 mg total) by mouth 2 (two)  times daily.   diclofenac  Sodium 1 % Gel Commonly known as: Voltaren  Apply 1 application. topically daily as needed (pain). Apply to feet for pain as needed   doxycycline  100 MG tablet Commonly known as: VIBRA -TABS Take 1 tablet (100 mg total) by mouth every 12 (twelve) hours for 5 days.   Eliquis  2.5 MG Tabs tablet Generic drug: apixaban  Take 1 tablet (2.5 mg total) by mouth 2 (two) times daily.   ezetimibe  10 MG tablet Commonly known as: ZETIA  TAKE 1 TABLET(10 MG) BY MOUTH EVERY EVENING   ferrous sulfate  325 (65 FE) MG tablet Take 650 mg by mouth in the morning.   FreeStyle Libre 14 Day Reader Espiridion 1 each by Other route as needed (blood glucose).   furosemide  40 MG tablet Commonly known as: LASIX  Take 1 tablet (40 mg total) by mouth 2 (two) times daily. What changed: when to take this   gabapentin  300 MG capsule Commonly known as: NEURONTIN  Take 1 capsule (300 mg total) by mouth daily. What changed: when to take this   Glucagon 3 MG/DOSE Powd Place 1 spray into the nose as needed (low blood sugar).   glucose blood test strip Commonly known as: Accu-Chek Aviva Plus 1 each by Other route 2 (two) times daily. And lancets 2/day   hydroxypropyl methylcellulose / hypromellose 2.5 % ophthalmic solution Commonly known as: ISOPTO TEARS / GONIOVISC Place 1 drop into both eyes 3 (three) times daily as needed (dry/irritated eyes.).   insulin  lispro 100 UNIT/ML injection Commonly known as: HUMALOG  Inject 1-10 Units into the skin 3 (three) times daily before meals. Per sliding scale   isosorbide  mononitrate 120 MG 24 hr tablet Commonly known as: IMDUR  TAKE 1 TABLET(120 MG) BY MOUTH DAILY What changed: See the new instructions.   lidocaine  5 % Commonly known as: LIDODERM  PLACE 1 PATCH ONTO SKIN DAILY, REMOVE AND DISCARD WITHIN 12 HOURS OR AS DIRECTED   nitroGLYCERIN  0.4 MG SL tablet Commonly known as: NITROSTAT   PLACE 1 TABLET UNDER THE TONGUE EVERY 5 MINUTES AS NEEDED  FOR CHEST PAIN   Nurtec 75 MG Tbdp Generic drug: Rimegepant Sulfate Take 75 mg by mouth daily as needed (migraine).   ondansetron  4 MG tablet Commonly known as: ZOFRAN  Take 1 tablet (4 mg total) by mouth every 6 (six) hours as needed for nausea.   oxyCODONE -acetaminophen  10-325 MG tablet Commonly known as: PERCOCET Take 1 tablet by mouth 4 (four) times daily as needed for pain.   pantoprazole  40 MG tablet Commonly known as: PROTONIX  TAKE 1 TABLET BY MOUTH TWICE DAILY BEFORE A MEAL   polyethylene glycol powder 17 GM/SCOOP powder Commonly known as: MiraLax  Take 17 g by mouth daily as needed for mild constipation.   predniSONE  10 MG (21) Tbpk tablet Commonly known as: STERAPRED UNI-PAK 21 TAB Take 6 tablets by mouth on day 1, 5 tablets on day 2, 4 tablets on day 3, 3 tablets on day 4, 2 tablets on day 5, 1 tablet on day 6 and then stop   Repatha  SureClick 140 MG/ML Soaj Generic drug: Evolocumab  INJECT 1 PEN INTO THE SKIN EVERY 14 DAYS   rosuvastatin  10 MG tablet Commonly known as: CRESTOR  Take 1 tablet (10 mg total) by mouth daily. What changed:  medication strength how much to take   silver sulfADIAZINE 1 % cream Commonly known as: SILVADENE Apply 1 Application topically daily as needed (for burn).   sodium bicarbonate  650 MG tablet Take 1 tablet (650 mg total) by mouth 2 (two) times daily.   Tresiba  FlexTouch 200 UNIT/ML FlexTouch Pen Generic drug: insulin  degludec Inject 14 Units into the skin in the morning.   TUMS PO Take 2 tablets by mouth 3 (three) times daily as needed (acid indigestion).   Vitamin D  (Ergocalciferol ) 1.25 MG (50000 UNIT) Caps capsule Commonly known as: DRISDOL  Take 50,000 Units by mouth every Wednesday.               Discharge Care Instructions  (From admission, onward)           Start     Ordered   11/30/24 0000  Discharge wound care:       Comments: Per Podiatry. Has appointment 12/01/24   11/30/24 1455             Contact information for follow-up providers     Norval Kettle, MD Follow up.   Specialty: Internal Medicine Contact information: [address] [place] [address] [phone]              Contact information for after-discharge care     Durable Medical Equipment     [facility] (DME) .   Service: Durable Medical Equipment Contact information: [address] [place]  [address] [phone]                    Discharge Exam: Fredricka Weights   11/28/24 0431 11/29/24 0454 11/30/24 0426  Weight: 60.7 kg 55.6 kg 56.4 kg   Vitals:   11/30/24 0900 11/30/24 1411  BP:  (!) 166/56  Pulse:  78  Resp:  18  Temp:  98 F (36.7 C)  SpO2: 91% 98%   Examination: Physical Exam:  Constitutional: Elderly African-American female in no acute distress Respiratory: Diminished to auscultation bilaterally, no wheezing, rales, rhonchi or crackles. Normal respiratory effort and patient is not tachypenic. No accessory muscle use.  Unlabored breathing Cardiovascular: RRR, no murmurs / rubs / gallops. S1 and  S2 auscultated. No extremity edema..  Abdomen: Soft, non-tender, non-distended. Bowel sounds positive.  GU: Deferred. Musculoskeletal: No clubbing / cyanosis of digits/nails. No joint deformity upper and lower extremities. Skin: No rashes, lesions, ulcers on the limited skin evaluation. No induration; Warm and dry.  Neurologic: CN 2-12 grossly intact with no focal deficits. Romberg sign and cerebellar reflexes not assessed.  Psychiatric: Normal judgment and insight. Alert and oriented x 3. Normal mood and appropriate affect.   Condition at discharge: stable  The results of significant diagnostics from this hospitalization (including imaging, microbiology, ancillary and laboratory) are listed below for reference.   Imaging Studies: ECHOCARDIOGRAM COMPLETE Result Date: 11/27/2024    ECHOCARDIOGRAM REPORT   Patient Name:   HANIFA ANTONETTI Date of Exam: 11/27/2024 Medical Rec #:  [id number]      Height:       63.0 in Accession #:    [id number]     Weight:       129.0 lb Date of Birth:  02-Oct-1956     BSA:          1.605 m Patient Age:    68 years       BP:           171/72 mmHg Patient Gender: F              HR:           73 bpm. Exam Location:  Inpatient Procedure: 2D Echo, 3D Echo, Cardiac Doppler and Color Doppler (Both Spectral            and Color Flow Doppler were utilized during procedure). Indications:    Dyspnea R06.00  History:        Patient has prior history of Echocardiogram examinations, most                 recent 10/13/2023. CHF, CAD, COPD and GERD,                 Signs/Symptoms:Dyspnea and Shortness of Breath; Risk                 Factors:Hypertension.  Sonographer:    Koleen Popper RDCS Referring Phys: JOETTE PEBBLES  Sonographer Comments: Image acquisition challenging due to patient body habitus and Image acquisition challenging due to respiratory motion. IMPRESSIONS  1. Left ventricular ejection fraction, by estimation, is 60 to 65%. Left ventricular ejection fraction by 3D volume is 68 %. Left ventricular ejection fraction by 2D MOD biplane is 59.4 %. The left ventricle has normal function. The left ventricle has no regional wall motion abnormalities. Left ventricular diastolic parameters are consistent with Grade II diastolic dysfunction (pseudonormalization).  2. Right ventricular systolic function is normal. The right ventricular size is normal. There is mildly elevated pulmonary artery systolic pressure.  3. Left atrial size was mildly dilated.  4. The mitral valve is normal in structure. Mild mitral valve regurgitation. No evidence of mitral stenosis.  5. The aortic valve is tricuspid. Aortic valve regurgitation is not visualized. Aortic valve sclerosis/calcification is present, without any evidence of aortic stenosis.  6. The inferior vena cava is dilated in size with >50% respiratory variability, suggesting right  atrial pressure of 8 mmHg. Comparison(s): Changes from prior study are noted. Improved LV systolic function. FINDINGS  Left Ventricle: Left ventricular ejection fraction, by estimation, is 60 to 65%. Left ventricular ejection fraction by 2D MOD biplane is 59.4 %. Left ventricular ejection fraction by 3D volume is 68 %. The left  ventricle has normal function. The left ventricle has no regional wall motion abnormalities. The left ventricular internal cavity size was normal in size. There is borderline concentric left ventricular hypertrophy. Left ventricular diastolic parameters are consistent with Grade II diastolic dysfunction (pseudonormalization). Right Ventricle: The right ventricular size is normal. No increase in right ventricular wall thickness. Right ventricular systolic function is normal. There is mildly elevated pulmonary artery systolic pressure. The tricuspid regurgitant velocity is 2.68  m/s, and with an assumed right atrial pressure of 8 mmHg, the estimated right ventricular systolic pressure is 36.7 mmHg. Left Atrium: Left atrial size was mildly dilated. Right Atrium: Right atrial size was normal in size. Pericardium: Trivial pericardial effusion is present. Mitral Valve: The mitral valve is normal in structure. Mild mitral valve regurgitation. No evidence of mitral valve stenosis. Tricuspid Valve: The tricuspid valve is normal in structure. Tricuspid valve regurgitation is trivial. No evidence of tricuspid stenosis. Aortic Valve: The aortic valve is tricuspid. Aortic valve regurgitation is not visualized. Aortic valve sclerosis/calcification is present, without any evidence of aortic stenosis. Pulmonic Valve: The pulmonic valve was normal in structure. Pulmonic valve regurgitation is trivial. No evidence of pulmonic stenosis. Aorta: The aortic root and ascending aorta are structurally normal, with no evidence of dilitation. Venous: The inferior vena cava is dilated in size with greater than 50%  respiratory variability, suggesting right atrial pressure of 8 mmHg. IAS/Shunts: No atrial level shunt detected by color flow Doppler. Additional Comments: 3D was performed not requiring image post processing on an independent workstation and was normal.  LEFT VENTRICLE PLAX 2D                        Biplane EF (MOD) LVIDd:         4.50 cm         LV Biplane EF:   Left LVIDs:         2.80 cm                          ventricular LV PW:         1.10 cm                          ejection LV IVS:        1.00 cm                          fraction by LVOT diam:     1.50 cm                          2D MOD LV SV:         49                               biplane is LV SV Index:   31                               59.4 %. LVOT Area:     1.77 cm                                Diastology  LV e' medial:    4.68 cm/s LV Volumes (MOD)               LV E/e' medial:  23.3 LV vol d, MOD    120.0 ml      LV e' lateral:   6.25 cm/s A2C:                           LV E/e' lateral: 17.4 LV vol d, MOD    164.0 ml A4C: LV vol s, MOD    46.0 ml       3D Volume EF A2C:                           LV 3D EF:    Left LV vol s, MOD    67.2 ml                    ventricul A4C:                                        ar LV SV MOD A2C:   74.0 ml                    ejection LV SV MOD A4C:   164.0 ml                   fraction LV SV MOD BP:    84.6 ml                    by 3D                                             volume is                                             68 %.                                 3D Volume EF:                                3D EF:        68 %                                LV EDV:       101 ml                                LV ESV:       32 ml                                LV SV:        69 ml RIGHT VENTRICLE  IVC RV Basal diam:  3.70 cm    IVC diam: 2.60 cm RV S prime:     9.56 cm/s TAPSE (M-mode): 2.7 cm LEFT ATRIUM             Index        RIGHT ATRIUM           Index LA diam:         3.70 cm 2.31 cm/m   RA Area:     12.70 cm LA Vol (A2C):   50.0 ml 31.16 ml/m  RA Volume:   28.00 ml  17.45 ml/m LA Vol (A4C):   65.9 ml 41.06 ml/m LA Biplane Vol: 57.3 ml 35.70 ml/m  AORTIC VALVE LVOT Vmax:   123.00 cm/s LVOT Vmean:  80.400 cm/s LVOT VTI:    0.279 m  AORTA Ao Root diam: 3.30 cm Ao Asc diam:  2.80 cm MITRAL VALVE                TRICUSPID VALVE MV Area (PHT): 4.06 cm     TR Peak grad:   28.7 mmHg MV Decel Time: 187 msec     TR Vmax:        268.00 cm/s MR Peak grad: 52.4 mmHg MR Vmax:      362.00 cm/s   SHUNTS MV E velocity: 109.00 cm/s  Systemic VTI:  0.28 m MV A velocity: 103.00 cm/s  Systemic Diam: 1.50 cm MV E/A ratio:  1.06 Georganna Archer Electronically signed by Georganna Archer Signature Date/Time: 11/27/2024/2:48:29 PM    Final    US  Abdomen Complete Result Date: 11/26/2024 EXAM: COMPLETE ABDOMINAL ULTRASOUND TECHNIQUE: Real-time ultrasonography of the abdomen was performed. COMPARISON: CT chest 11/26/2024 and CT abdomen and pelvis 05/18/2021. CLINICAL HISTORY: Distended abdomen. FINDINGS: LIVER: Normal echogenicity. No intrahepatic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

## 2024-11-30 NOTE — Plan of Care (Signed)

## 2024-11-30 NOTE — Telephone Encounter (Signed)
 Pharmacy Patient Advocate Encounter  Insurance verification completed.    The patient is insured through [facility]. Patient has Medicare and is not eligible for a copay card, but may be able to apply for patient assistance or Medicare RX Payment Plan (Patient Must reach out to their plan, if eligible for payment plan), if available.    Ran test claim for Eliquis  2.5mg  tablet and the current 30 day co-pay is $4.90.   This test claim was processed through [facility]- copay amounts may vary at other pharmacies due to pharmacy/plan contracts, or as the patient moves through the different stages of their insurance plan.

## 2024-11-30 NOTE — TOC Initial Note (Addendum)
 Transition of Care [facility]) - Initial/Assessment Note    Patient Details  Name: Alejandra Hernandez MRN: [id number] Date of Birth: [birth year]  Transition of Care [facility] CM/SW Contact:    Marval Gell, RN Phone Number: 11/30/2024, 8:00 AM  Clinical Narrative:                  Patient admitted from home for shortness of breath. Continues with supplemental oxygen. Amb sats 1/5 indicate need for home oxygen, DME oxygen order in place, oxygen ordered to be delivered to the room through [employer].  Spoke w patient and she states that she has PCP apt Friday and Wound appointment tomorrow for her foot. PCP added to AVS for patient to call and schedule.  Coverage through [facility] Medicare Dual Complete    Barriers to Discharge: Continued Medical Work up   Patient Goals and CMS Choice Patient states their goals for this hospitalization and ongoing recovery are:: to go home          Expected Discharge Plan and Services                         DME Arranged: Oxygen DME Agency: [facility] Date DME Agency Contacted: 11/30/24 Time DME Agency Contacted: [phone] Representative spoke with at DME Agency: Ryan            Prior Living Arrangements/Services                       Activities of Daily Living   ADL Screening (condition at time of admission) Independently performs ADLs?: Yes (appropriate for developmental age) Is the patient deaf or have difficulty hearing?: No Does the patient have difficulty seeing, even when wearing glasses/contacts?: No Does the patient have difficulty concentrating, remembering, or making decisions?: No  Permission Sought/Granted                  Emotional Assessment              Admission diagnosis:  Acute respiratory failure with hypoxia (HCC) [J96.01] COPD with acute exacerbation (HCC) [J44.1] Patient Active Problem List   Diagnosis Date Noted   CKD (chronic kidney disease) stage 5, GFR less than 15 ml/min (HCC) 11/26/2024   COPD  with acute exacerbation (HCC) 11/26/2024   Melena 11/26/2024   Open wound of right great toe 11/26/2024   Iron  deficiency 09/02/2024   Anemia of chronic renal failure 09/02/2024   Anemia due to stage 4 chronic kidney disease treated with erythropoietin  (HCC) 08/09/2024   Chronic anticoagulation 08/03/2021   Nausea vomiting and diarrhea 05/19/2021   Dehydration 05/19/2021   Hyponatremia 05/18/2021   Hypomagnesemia 12/27/2020   Abnormal findings on diagnostic imaging of other specified body structures 08/27/2020   Disorder of mineral metabolism, unspecified 08/27/2020   Personal history of other venous thrombosis and embolism 08/27/2020   Pulmonary nodule 08/03/2020   Acidosis 07/06/2020   Complaints of total body pain 07/10/2019   Pain, unspecified 07/10/2019   Left upper extremity swelling 06/03/2019   COPD, 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   Leukocytosis 04/14/2019   Avascular necrosis of humeral head, left (HCC) 04/14/2019   Hypokalemia 04/14/2019   Neuropathy 04/14/2019   DKA (diabetic ketoacidosis) (HCC) 04/13/2019   Polyp of transverse colon    Atypical chest pain 11/16/2018   Diabetes mellitus, type II (HCC) 05/23/2018   CKD (chronic kidney disease), stage III (HCC) 05/14/2018   Unstable angina (HCC) 05/14/2018   Hyperglycemia 12/23/2017  Low TSH level 06/22/2017   CAD (coronary artery disease)    Nodule on liver 05/22/2017   Liver disease, unspecified 05/22/2017   Heart palpitations 05/14/2017   Unspecified skin changes 04/18/2017   Tobacco use 03/17/2017   Benign neoplasm of descending colon    Benign neoplasm of sigmoid colon    Chronic diastolic CHF (congestive heart failure) (HCC) 02/09/2017   History of DVT (deep vein thrombosis)    Abnormal CT scan, chest    Acute urinary retention    Claudication 07/23/2016   PAD (peripheral artery disease)    Essential hypertension 05/14/2016   Uncontrolled type 2 diabetes mellitus with ketoacidosis without coma, with long-term  current use of insulin  (HCC)    GERD (gastroesophageal reflux disease) 05/17/2014   Carotid artery disease 05/17/2014   Iron  deficiency anemia 04/26/2014   Dyspnea 04/25/2014   Carotid artery bruit 04/25/2014   Hyperlipidemia    Gout    PCP:  Norval Kettle, MD Pharmacy:   [facility] [phone] - [place], [place] - [address] AT [facility] [address] [facility] [address] [place] [address] Phone: [phone] Fax: [phone]   [facility] - [place], [place] - [address] [place] [address] Phone: [phone] Fax: [phone]     Social Drivers of Health (SDOH) Social History: SDOH Screenings   Food Insecurity: Food Insecurity Present (11/27/2024)  Housing: Low Risk (11/27/2024)  Transportation Needs: No Transportation Needs (11/27/2024)  Utilities: Not At Risk (11/27/2024)  Social Connections: Moderately Integrated (11/28/2024)  Tobacco Use: High Risk (11/27/2024)   SDOH Interventions:     Readmission Risk Interventions     No data to display

## 2024-12-01 ENCOUNTER — Encounter: Payer: Self-pay | Admitting: Podiatry

## 2024-12-01 ENCOUNTER — Ambulatory Visit: Admitting: Podiatry

## 2024-12-01 DIAGNOSIS — M79671 Pain in right foot: Secondary | ICD-10-CM

## 2024-12-01 DIAGNOSIS — M79672 Pain in left foot: Secondary | ICD-10-CM | POA: Diagnosis not present

## 2024-12-01 DIAGNOSIS — B351 Tinea unguium: Secondary | ICD-10-CM | POA: Diagnosis not present
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
## 2024-12-02 ENCOUNTER — Encounter (HOSPITAL_COMMUNITY)

## 2024-12-02 NOTE — Hospital Course (Signed)
 The patient is 69 y.o. female with a past medical history of essential hypertension, chronic kidney disease stage V, diabetes mellitus type 2, coronary artery disease, peripheral artery disease with stent placement, history of DVT in 2018 on Xarelto , right first toe wound followed by [facility], who was in her usual state of health till about 2 to 3 days ago when she started developing shortness of breath. She also had chest pain in the center part of her chest. Had some nausea but no vomiting. Had dizzy spells but did not pass out. She was wheezing since last night. She had a dry cough. Denies any fever chills. No exposure to any sick contacts. She mentioned that she does notice swelling in her legs at times but none currently. Has not really noticed any change in her weight but feels a little bloated. And for the past day or 2 she has noted black-colored stool. She does have a history of GI bleed. She is followed by gastroenterology in [facility]. When EMS arrived she was noted to be hypoxic. She is currently on oxygen at 2 L by nasal cannula. In the emergency department she was given nebulizer treatments after which she started feeling better. Still requiring oxygen. So she will be hospitalized for further management.   She is clearly improved and nephrology was consulted and cleared her for discharge home.  She needs to follow up with PCP, Podiatry, and Nephrology within 1 to 2 weeks.  Assessment and Plan:  Acute Respiratory failure with hypoxia Likely multifactorial.  See below. Continues to be on oxygen at 2 to 3 L/min by nasal cannula.  Sats noted to be in the early 90s.   Diuresed and did not need supplemental oxygen on repeat home amatory O2 screen and stable for discharge.   COPD with acute exacerbation Patient continues to smoke.  She was counseled.  She was wheezing when she came into the emergency department.  She was given nebulizer treatments with improvement. COPD component has  improved.  She still appears to be volume overloaded.  See below.  Steroids were changed to oral.  Plan a quick taper over 5 to 6 days.  Discharged on a Sterapred taper and oral antibiotics.   Community-acquired pneumonia See above as well. Continue with ceftriaxone  and azithromycin  and changed to p.o.  No evidence for sepsis. Elevated WBC most likely due to steroids. She is noted to be afebrile.  5-day course of antibiotics.   Volume overload/chronic diastolic CHF She does have a history of diastolic CHF.  Last echocardiogram is from November 2024 which showed LVEF of 50 to 55%.  Grade 1 diastolic dysfunction was noted.  Right ventricular function was normal.   Echo was repeated and shows normal LVEF but grade 2 diastolic dysfunction was noted.  Right ventricular function was noted to be normal.  No significant valvular abnormalities noted.   Volume overload is most likely due to acute on chronic kidney disease.  See below    Acute on chronic kidney disease stage V/metabolic acidosis Creatinine noted to be 4.97.  Creatinine was 4.36 in September.  She is followed by Dr. Dolan with [facility] -BUN/Cr Trend: Recent Labs  Lab 11/26/24 1220 11/26/24 1228 11/27/24 0433 11/28/24 0244 11/29/24 0510 11/30/24 0511  BUN 64* 60* 70* 72* 83* 85*  CREATININE 4.97* 5.50* 5.28* 5.13* 5.57* 5.43*  -Avoid Nephrotoxic Medications, Contrast Dyes, Hypotension and Dehydration to Ensure Adequate Renal Perfusion and will need to Renally Adjust Meds -Continue  to Monitor and Trend Renal Function  -CT chest apart from raising concern for infectious process also raised concern for volume overload.  She does not have significant pedal edema however she does have some abdominal distention. Ultrasound does not show any hydronephrosis.  UA reviewed.  Creatinine is higher than her baseline. Patient seen by nephrology.  Remains on IV furosemide  without much urine output but was changed to oral  diuresis per nephrology.  Dialysis was discussed with the patient yesterday by nephrology.  Patient was very hesitant.  Spoke to her this morning and asked her to reconsider especially in the setting of worsening renal function.  She will think about it and discussed with nephrology. Bicarbonate level has improved.  Continue sodium bicarbonate . -Nephrology has cleared her for discharge.   Abdominal distention She denies drinking alcohol .  No history of liver disease.  Abdominal ultrasound does not reveal any ascites.  LFTs unremarkable.  No further workup at this time.  Follow-up in outpatient setting   Melena She reports black-colored stool for the past 1 to 2 days.  She has a history of same.  She is being followed by gastroenterology and [facility].   It looks like she underwent small bowel enteroscopy in August 2025.  Impression as below: The esophagus appeared normal.  Moderate friable mucosa with erosion in the body of the stomach and  antrum, suggestive of gastritis  The pylorus appeared normal.  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 No old blood or active bleeding was noted in the small  intestine.  FOBT however noted to be negative.  Does not appear to be actively bleeding.  Continue with PPI for now.  Currently no need to involve gastroenterology.  She may follow-up with her own gastroenterologist in [facility].  She will be talking to her doctor about getting referred to someone locally and this can be done in outpatient setting   Peripheral Artery Disease She has multiple stents in her lower extremity.  According to angiogram done in June 2024 she was noted to have a patent common iliac artery stent with mild in-stent  restenosis.  She has had angioplasties previously in 2019.   She is not noted to be on any antiplatelet agents but has been on Xarelto  which was held due to GI bleed.  Hemoglobin is low but stable.  Held Xarelto  in case she agrees to hemodialysis and needs dialysis catheter placement however this is not necessary and now will be changed to p.o. apixaban    Essential Hypertension: Blood pressure remains elevated.   She is on carvedilol , nitrates and clonidine .  Hydralazine  as needed.  She is getting furosemide  intravenously.   Monitor blood pressure closely.  Not being very aggressive with blood pressure control due to acute component to her kidney disease. Follow-up with PCP within 1 weeks-   History of DVT This was in 2018.  Xarelto  was initially held due to concern for GI bleed.  Now on hold in case HD catheter needs to be placed.  Will be resuming apixaban  per the nephrology recommendations   Coronary Artery Disease: Has a stent in her RCA.  Last cardiac catheterization was in 2022 which showed stable disease.  Continue carvedilol .   Diabetes mellitus type 2 She mentions  that she is no longer on insulin  because her glucose levels were very low.   CBGs have been high in the hospital probably due to steroids.  Continue SSI. HbA1c 6.3.   Continue low-dose glargine.  Anticipate glucose levels will improve as steroid is tapered down. Recent Labs  Lab 11/29/24 0712 11/29/24 1133 11/29/24 1538 11/29/24 2121 11/29/24 2358 11/30/24 0737 11/30/24 1142  GLUCAP 310* 205* 294* 403* 290* 267* 300*   Abnormal LFTs:  Recent Labs  Lab 11/26/24 1220 11/27/24 0433 11/30/24 0511  AST 48* 24 73*  ALT 46* 37 136*  -Follow up in the outpatient setting and Complete U/S done and showed No sonographic evidence of acute abdominal abnormality or biliary obstruction; minimal pericholecystic edema without cholelithiasis or gallbladder wall thickening, likely reactive. Bilateral pleural  effusions.  Microcytic Anemia Recently done iron  panel shows ferritin of 413, iron  83, TIBC 197, percent saturation 42. She tells me that she has received blood transfusions due to anemia from her GI blood loss.  As recently as a few months ago. Hgb/Hct Trend:  Recent Labs  Lab 11/25/24 1313 11/26/24 1220 11/26/24 1220 11/26/24 1228 11/27/24 0433 11/28/24 0244 11/29/24 0510 11/30/24 0511  HGB 8.8* 9.3*  --  10.9* 9.5* 8.2* 8.2* 9.2*  HCT  --  29.8*   < > 32.0* 30.3* 25.7* 25.5* 28.9*  MCV  --  72.7*   < >  --  71.3* 70.4* 70.2* 70.8*   < > = values in this interval not displayed.  -Follow up in the outpatient setting and repeat CBC within 1-2 weeks    Hypoalbuminemia: Patient's Albumin  Trend: Recent Labs  Lab 11/26/24 1220 11/27/24 0433 11/28/24 0244 11/29/24 0510 11/30/24 0511  ALBUMIN  3.2* 3.2* 2.8* 2.9* 3.1*  3.1*  -Continue to Monitor and Trend and repeat CMP in the AM

## 2024-12-09 ENCOUNTER — Encounter (HOSPITAL_COMMUNITY)
Admission: RE | Admit: 2024-12-09 | Discharge: 2024-12-09 | Disposition: A | Discharge status: Home / Self Care | Source: Ambulatory Visit | Attending: Nephrology | Admitting: Nephrology

## 2024-12-09 VITALS — BP 126/67 | HR 66 | Temp 97.2°F

## 2024-12-09 DIAGNOSIS — D631 Anemia in chronic kidney disease: Secondary | ICD-10-CM

## 2024-12-09 LAB — POCT HEMOGLOBIN-HEMACUE: Hemoglobin: 8.1 g/dL — ABNORMAL LOW (ref 12.0–15.0)

## 2024-12-09 MED ORDER — EPOETIN ALFA-EPBX 20000 UNIT/ML IJ SOLN
20000.0000 [IU] | Freq: Once | INTRAMUSCULAR | Status: AC
  Administered 2024-12-09: 20000 [IU] via SUBCUTANEOUS

## 2024-12-09 MED ORDER — EPOETIN ALFA-EPBX 20000 UNIT/ML IJ SOLN
INTRAMUSCULAR | Status: AC
  Filled 2024-12-09: qty 1

## 2024-12-10 ENCOUNTER — Emergency Department (HOSPITAL_COMMUNITY)

## 2024-12-10 ENCOUNTER — Inpatient Hospital Stay (HOSPITAL_COMMUNITY)
Admission: EM | Admit: 2024-12-10 | Discharge: 2024-12-12 | DRG: 641 | Disposition: A | Discharge status: Home / Self Care | Attending: Internal Medicine | Admitting: Internal Medicine

## 2024-12-10 ENCOUNTER — Encounter (HOSPITAL_COMMUNITY): Payer: Self-pay

## 2024-12-10 ENCOUNTER — Other Ambulatory Visit: Payer: Self-pay

## 2024-12-10 DIAGNOSIS — I10 Essential (primary) hypertension: Secondary | ICD-10-CM | POA: Diagnosis present

## 2024-12-10 DIAGNOSIS — I2510 Atherosclerotic heart disease of native coronary artery without angina pectoris: Secondary | ICD-10-CM | POA: Diagnosis not present

## 2024-12-10 DIAGNOSIS — E1122 Type 2 diabetes mellitus with diabetic chronic kidney disease: Secondary | ICD-10-CM | POA: Diagnosis present

## 2024-12-10 DIAGNOSIS — I5032 Chronic diastolic (congestive) heart failure: Secondary | ICD-10-CM | POA: Diagnosis present

## 2024-12-10 DIAGNOSIS — E1142 Type 2 diabetes mellitus with diabetic polyneuropathy: Secondary | ICD-10-CM | POA: Diagnosis present

## 2024-12-10 DIAGNOSIS — Z8249 Family history of ischemic heart disease and other diseases of the circulatory system: Secondary | ICD-10-CM

## 2024-12-10 DIAGNOSIS — Z8419 Family history of other disorders of kidney and ureter: Secondary | ICD-10-CM

## 2024-12-10 DIAGNOSIS — I252 Old myocardial infarction: Secondary | ICD-10-CM

## 2024-12-10 DIAGNOSIS — Z955 Presence of coronary angioplasty implant and graft: Secondary | ICD-10-CM

## 2024-12-10 DIAGNOSIS — Z7901 Long term (current) use of anticoagulants: Secondary | ICD-10-CM

## 2024-12-10 DIAGNOSIS — N185 Chronic kidney disease, stage 5: Secondary | ICD-10-CM | POA: Diagnosis not present

## 2024-12-10 DIAGNOSIS — Z794 Long term (current) use of insulin: Secondary | ICD-10-CM

## 2024-12-10 DIAGNOSIS — Z800 Family history of malignant neoplasm of digestive organs: Secondary | ICD-10-CM

## 2024-12-10 DIAGNOSIS — G9341 Metabolic encephalopathy: Secondary | ICD-10-CM | POA: Diagnosis not present

## 2024-12-10 DIAGNOSIS — M19042 Primary osteoarthritis, left hand: Secondary | ICD-10-CM | POA: Diagnosis present

## 2024-12-10 DIAGNOSIS — E871 Hypo-osmolality and hyponatremia: Secondary | ICD-10-CM

## 2024-12-10 DIAGNOSIS — M479 Spondylosis, unspecified: Secondary | ICD-10-CM | POA: Diagnosis present

## 2024-12-10 DIAGNOSIS — E785 Hyperlipidemia, unspecified: Secondary | ICD-10-CM | POA: Diagnosis present

## 2024-12-10 DIAGNOSIS — M1909 Primary osteoarthritis, other specified site: Secondary | ICD-10-CM | POA: Diagnosis present

## 2024-12-10 DIAGNOSIS — R569 Unspecified convulsions: Secondary | ICD-10-CM | POA: Diagnosis present

## 2024-12-10 DIAGNOSIS — Z833 Family history of diabetes mellitus: Secondary | ICD-10-CM

## 2024-12-10 DIAGNOSIS — E1151 Type 2 diabetes mellitus with diabetic peripheral angiopathy without gangrene: Secondary | ICD-10-CM | POA: Diagnosis present

## 2024-12-10 DIAGNOSIS — E8809 Other disorders of plasma-protein metabolism, not elsewhere classified: Secondary | ICD-10-CM | POA: Diagnosis present

## 2024-12-10 DIAGNOSIS — D6859 Other primary thrombophilia: Secondary | ICD-10-CM | POA: Diagnosis present

## 2024-12-10 DIAGNOSIS — J449 Chronic obstructive pulmonary disease, unspecified: Secondary | ICD-10-CM | POA: Diagnosis present

## 2024-12-10 DIAGNOSIS — Z888 Allergy status to other drugs, medicaments and biological substances status: Secondary | ICD-10-CM

## 2024-12-10 DIAGNOSIS — Z79899 Other long term (current) drug therapy: Secondary | ICD-10-CM

## 2024-12-10 DIAGNOSIS — I6783 Posterior reversible encephalopathy syndrome: Secondary | ICD-10-CM | POA: Diagnosis not present

## 2024-12-10 DIAGNOSIS — Z981 Arthrodesis status: Secondary | ICD-10-CM

## 2024-12-10 DIAGNOSIS — M19041 Primary osteoarthritis, right hand: Secondary | ICD-10-CM | POA: Diagnosis present

## 2024-12-10 DIAGNOSIS — E8722 Chronic metabolic acidosis: Secondary | ICD-10-CM

## 2024-12-10 DIAGNOSIS — I132 Hypertensive heart and chronic kidney disease with heart failure and with stage 5 chronic kidney disease, or end stage renal disease: Secondary | ICD-10-CM | POA: Diagnosis not present

## 2024-12-10 DIAGNOSIS — Z7951 Long term (current) use of inhaled steroids: Secondary | ICD-10-CM

## 2024-12-10 DIAGNOSIS — Z86718 Personal history of other venous thrombosis and embolism: Secondary | ICD-10-CM

## 2024-12-10 DIAGNOSIS — M545 Low back pain, unspecified: Secondary | ICD-10-CM | POA: Diagnosis present

## 2024-12-10 DIAGNOSIS — Z808 Family history of malignant neoplasm of other organs or systems: Secondary | ICD-10-CM

## 2024-12-10 DIAGNOSIS — F17210 Nicotine dependence, cigarettes, uncomplicated: Secondary | ICD-10-CM | POA: Diagnosis present

## 2024-12-10 DIAGNOSIS — D631 Anemia in chronic kidney disease: Secondary | ICD-10-CM | POA: Diagnosis present

## 2024-12-10 DIAGNOSIS — E1110 Type 2 diabetes mellitus with ketoacidosis without coma: Secondary | ICD-10-CM

## 2024-12-10 DIAGNOSIS — G8929 Other chronic pain: Secondary | ICD-10-CM | POA: Diagnosis present

## 2024-12-10 DIAGNOSIS — M1711 Unilateral primary osteoarthritis, right knee: Secondary | ICD-10-CM | POA: Diagnosis present

## 2024-12-10 LAB — CBC WITH DIFFERENTIAL/PLATELET
Abs Immature Granulocytes: 0.08 K/uL — ABNORMAL HIGH (ref 0.00–0.07)
Basophils Absolute: 0 K/uL (ref 0.0–0.1)
Basophils Relative: 0 %
Eosinophils Absolute: 0.2 K/uL (ref 0.0–0.5)
Eosinophils Relative: 2 %
HCT: 28.5 % — ABNORMAL LOW (ref 36.0–46.0)
Hemoglobin: 8.7 g/dL — ABNORMAL LOW (ref 12.0–15.0)
Immature Granulocytes: 1 %
Lymphocytes Relative: 14 %
Lymphs Abs: 1.8 K/uL (ref 0.7–4.0)
MCH: 22.5 pg — ABNORMAL LOW (ref 26.0–34.0)
MCHC: 30.5 g/dL (ref 30.0–36.0)
MCV: 73.8 fL — ABNORMAL LOW (ref 80.0–100.0)
Monocytes Absolute: 1.1 K/uL — ABNORMAL HIGH (ref 0.1–1.0)
Monocytes Relative: 9 %
Neutro Abs: 9.2 K/uL — ABNORMAL HIGH (ref 1.7–7.7)
Neutrophils Relative %: 74 %
Platelets: 265 K/uL (ref 150–400)
RBC: 3.86 MIL/uL — ABNORMAL LOW (ref 3.87–5.11)
RDW: 20.6 % — ABNORMAL HIGH (ref 11.5–15.5)
WBC: 12.4 K/uL — ABNORMAL HIGH (ref 4.0–10.5)
nRBC: 0 % (ref 0.0–0.2)

## 2024-12-10 LAB — COMPREHENSIVE METABOLIC PANEL WITH GFR
ALT: 63 U/L — ABNORMAL HIGH (ref 0–44)
AST: 23 U/L (ref 15–41)
Albumin: 2.8 g/dL — ABNORMAL LOW (ref 3.5–5.0)
Alkaline Phosphatase: 107 U/L (ref 38–126)
Anion gap: 15 (ref 5–15)
BUN: 76 mg/dL — ABNORMAL HIGH (ref 8–23)
CO2: 19 mmol/L — ABNORMAL LOW (ref 22–32)
Calcium: 5.7 mg/dL — CL (ref 8.9–10.3)
Chloride: 92 mmol/L — ABNORMAL LOW (ref 98–111)
Creatinine, Ser: 5.17 mg/dL — ABNORMAL HIGH (ref 0.44–1.00)
GFR, Estimated: 9 mL/min — ABNORMAL LOW
Glucose, Bld: 124 mg/dL — ABNORMAL HIGH (ref 70–99)
Potassium: 4.1 mmol/L (ref 3.5–5.1)
Sodium: 126 mmol/L — ABNORMAL LOW (ref 135–145)
Total Bilirubin: 0.3 mg/dL (ref 0.0–1.2)
Total Protein: 5.3 g/dL — ABNORMAL LOW (ref 6.5–8.1)

## 2024-12-10 LAB — LACTIC ACID, PLASMA: Lactic Acid, Venous: 0.6 mmol/L (ref 0.5–1.9)

## 2024-12-10 LAB — TSH: TSH: 0.724 u[IU]/mL (ref 0.350–4.500)

## 2024-12-10 LAB — I-STAT VENOUS BLOOD GAS, ED
Acid-base deficit: 6 mmol/L — ABNORMAL HIGH (ref 0.0–2.0)
Bicarbonate: 19.6 mmol/L — ABNORMAL LOW (ref 20.0–28.0)
Calcium, Ion: 0.73 mmol/L — CL (ref 1.15–1.40)
HCT: 30 % — ABNORMAL LOW (ref 36.0–46.0)
Hemoglobin: 10.2 g/dL — ABNORMAL LOW (ref 12.0–15.0)
O2 Saturation: 37 %
Potassium: 3.9 mmol/L (ref 3.5–5.1)
Sodium: 128 mmol/L — ABNORMAL LOW (ref 135–145)
TCO2: 21 mmol/L — ABNORMAL LOW (ref 22–32)
pCO2, Ven: 35.9 mmHg — ABNORMAL LOW (ref 44–60)
pH, Ven: 7.345 (ref 7.25–7.43)
pO2, Ven: 23 mmHg — CL (ref 32–45)

## 2024-12-10 LAB — MAGNESIUM: Magnesium: 1.2 mg/dL — ABNORMAL LOW (ref 1.7–2.4)

## 2024-12-10 LAB — MRSA NEXT GEN BY PCR, NASAL: MRSA by PCR Next Gen: NOT DETECTED

## 2024-12-10 LAB — CBG MONITORING, ED
Glucose-Capillary: 151 mg/dL — ABNORMAL HIGH (ref 70–99)
Glucose-Capillary: 133 mg/dL — ABNORMAL HIGH (ref 70–99)

## 2024-12-10 MED ORDER — CHLORHEXIDINE GLUCONATE CLOTH 2 % EX PADS
6.0000 | MEDICATED_PAD | Freq: Every day | CUTANEOUS | Status: DC
  Administered 2024-12-11: 6 via TOPICAL

## 2024-12-10 MED ORDER — ORAL CARE MOUTH RINSE: 15.0000 mL | OROMUCOSAL | Status: DC | PRN

## 2024-12-10 MED ORDER — INSULIN ASPART 100 UNIT/ML IJ SOLN
0.0000 [IU] | INTRAMUSCULAR | Status: DC
  Administered 2024-12-10: 1 [IU] via SUBCUTANEOUS
  Administered 2024-12-11: 2 [IU] via SUBCUTANEOUS
  Administered 2024-12-11: 1 [IU] via SUBCUTANEOUS
  Filled 2024-12-10 (×2): qty 1
  Filled 2024-12-10: qty 2

## 2024-12-10 MED ORDER — HEPARIN SODIUM (PORCINE) 5000 UNIT/ML IJ SOLN: 5000.0000 [IU] | Freq: Three times a day (TID) | INTRAMUSCULAR | Status: DC

## 2024-12-10 MED ORDER — IPRATROPIUM-ALBUTEROL 0.5-2.5 (3) MG/3ML IN SOLN: 3.0000 mL | Freq: Four times a day (QID) | RESPIRATORY_TRACT | Status: DC | PRN

## 2024-12-10 MED ORDER — LEVETIRACETAM (KEPPRA) 500 MG/5 ML ADULT IV PUSH: 1000.0000 mg | Freq: Once | INTRAVENOUS | Status: DC

## 2024-12-10 MED ORDER — SODIUM CHLORIDE 0.9 % IV SOLN
4.0000 g | Freq: Once | INTRAVENOUS | Status: AC
  Administered 2024-12-10: 4 g via INTRAVENOUS
  Filled 2024-12-10: qty 40

## 2024-12-10 MED ORDER — LEVETIRACETAM (KEPPRA) 500 MG/5 ML ADULT IV PUSH
1000.0000 mg | Freq: Once | INTRAVENOUS | Status: AC
  Administered 2024-12-10: 1000 mg via INTRAVENOUS

## 2024-12-10 MED ORDER — ALBUTEROL SULFATE (2.5 MG/3ML) 0.083% IN NEBU
INHALATION_SOLUTION | RESPIRATORY_TRACT | Status: AC
  Administered 2024-12-10: 10 mg
  Filled 2024-12-10: qty 12

## 2024-12-10 MED ORDER — MAGNESIUM SULFATE 2 GM/50ML IV SOLN
2.0000 g | Freq: Once | INTRAVENOUS | Status: AC
  Administered 2024-12-10: 2 g via INTRAVENOUS
  Filled 2024-12-10: qty 50

## 2024-12-10 MED ORDER — LEVETIRACETAM 500 MG/5ML IV SOLN
INTRAVENOUS | Status: AC
  Filled 2024-12-10: qty 10

## 2024-12-10 MED ORDER — APIXABAN 2.5 MG PO TABS
2.5000 mg | ORAL_TABLET | Freq: Two times a day (BID) | ORAL | Status: DC
  Administered 2024-12-11 – 2024-12-12 (×3): 2.5 mg via ORAL
  Filled 2024-12-10 (×3): qty 1

## 2024-12-10 MED ORDER — PANTOPRAZOLE SODIUM 40 MG IV SOLR
40.0000 mg | Freq: Two times a day (BID) | INTRAVENOUS | Status: DC
  Administered 2024-12-10 – 2024-12-12 (×4): 40 mg via INTRAVENOUS
  Filled 2024-12-10 (×4): qty 10

## 2024-12-10 MED ORDER — LORAZEPAM 2 MG/ML IJ SOLN
1.0000 mg | Freq: Once | INTRAMUSCULAR | Status: AC
  Administered 2024-12-10: 1 mg via INTRAVENOUS

## 2024-12-10 MED ORDER — LABETALOL HCL 5 MG/ML IV SOLN
10.0000 mg | Freq: Once | INTRAVENOUS | Status: AC
  Administered 2024-12-10: 10 mg via INTRAVENOUS
  Filled 2024-12-10: qty 4

## 2024-12-10 MED ORDER — POLYETHYLENE GLYCOL 3350 17 G PO PACK: 17.0000 g | PACK | Freq: Every day | ORAL | Status: DC | PRN

## 2024-12-10 MED ORDER — ARFORMOTEROL TARTRATE 15 MCG/2ML IN NEBU
15.0000 ug | INHALATION_SOLUTION | Freq: Two times a day (BID) | RESPIRATORY_TRACT | Status: DC
  Administered 2024-12-10 – 2024-12-12 (×4): 15 ug via RESPIRATORY_TRACT
  Filled 2024-12-10 (×4): qty 2

## 2024-12-10 MED ORDER — LORAZEPAM 2 MG/ML IJ SOLN
0.5000 mg | Freq: Once | INTRAMUSCULAR | Status: AC
  Administered 2024-12-10: 0.5 mg via INTRAVENOUS
  Filled 2024-12-10: qty 1

## 2024-12-10 MED ORDER — ROSUVASTATIN CALCIUM 5 MG PO TABS
10.0000 mg | ORAL_TABLET | Freq: Every day | ORAL | Status: DC
  Administered 2024-12-11 – 2024-12-12 (×2): 10 mg via ORAL
  Filled 2024-12-10 (×2): qty 2

## 2024-12-10 MED ORDER — BUDESONIDE 0.25 MG/2ML IN SUSP
0.2500 mg | Freq: Two times a day (BID) | RESPIRATORY_TRACT | Status: DC
  Administered 2024-12-10 – 2024-12-12 (×4): 0.25 mg via RESPIRATORY_TRACT
  Filled 2024-12-10 (×4): qty 2

## 2024-12-10 MED ORDER — CARVEDILOL 3.125 MG PO TABS
3.1250 mg | ORAL_TABLET | Freq: Two times a day (BID) | ORAL | Status: DC
  Administered 2024-12-11: 3.125 mg via ORAL
  Filled 2024-12-10: qty 1

## 2024-12-10 MED ORDER — SENNA 8.6 MG PO TABS: 1.0000 | ORAL_TABLET | Freq: Two times a day (BID) | ORAL | Status: DC | PRN

## 2024-12-10 MED ORDER — NICARDIPINE HCL IN NACL 20-0.86 MG/200ML-% IV SOLN
3.0000 mg/h | INTRAVENOUS | Status: DC
  Administered 2024-12-10: 5 mg/h via INTRAVENOUS
  Filled 2024-12-10 (×2): qty 200

## 2024-12-10 MED ORDER — SODIUM BICARBONATE 650 MG PO TABS: 650.0000 mg | ORAL_TABLET | Freq: Two times a day (BID) | ORAL | Status: DC

## 2024-12-10 MED ORDER — SODIUM CHLORIDE 0.9 % IV BOLUS
500.0000 mL | Freq: Once | INTRAVENOUS | Status: AC
  Administered 2024-12-10: 500 mL via INTRAVENOUS

## 2024-12-10 MED ORDER — HYDRALAZINE HCL 20 MG/ML IJ SOLN
5.0000 mg | Freq: Once | INTRAMUSCULAR | Status: AC
  Administered 2024-12-10: 5 mg via INTRAVENOUS
  Filled 2024-12-10: qty 1

## 2024-12-10 MED ORDER — REVEFENACIN 175 MCG/3ML IN SOLN
175.0000 ug | Freq: Every day | RESPIRATORY_TRACT | Status: DC
  Administered 2024-12-11 – 2024-12-12 (×2): 175 ug via RESPIRATORY_TRACT
  Filled 2024-12-10 (×2): qty 3

## 2024-12-10 MED ORDER — CALCIUM GLUCONATE-NACL 1-0.675 GM/50ML-% IV SOLN
1.0000 g | Freq: Once | INTRAVENOUS | Status: AC
  Administered 2024-12-10: 1000 mg via INTRAVENOUS
  Filled 2024-12-10: qty 50

## 2024-12-10 NOTE — ED Notes (Signed)
 PT BP went to 230 systolic, MD advised
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
## 2024-12-10 NOTE — Progress Notes (Signed)
 eLink Physician-Brief Progress Note Patient Name: Alejandra Hernandez DOB: 07-19-1956 MRN: [id number]   Date of Service  12/10/2024  HPI/Events of Note  69 year old with a history of CAD, COPD, CKD, HF, PAD, and DVT on chronic anticoagulation who presented with witnessed seizures and subsequent encephalopathy.  Admitted for neuromonitoring.  Found to be hypertensive into the 200s.  Vitals, labs, imaging were reviewed.  No evidence of electrolyte disturbances, hyponatremia, hypomagnesemia, no acute intracranial findings.  eICU Interventions  Initiate nicardipine  infusion.  Carvedilol  and apixaban  once safely tolerating oral intake  MRI brain per neurology.  Continue neuromonitoring in ICU with seizure precautions, protecting her airway for now  DVT prophylaxis with SCDs, Eliquis  GI prophylaxis with therapeutic PPI     Intervention Category Evaluation Type: New Patient Evaluation  Cuahutemoc Attar 12/10/2024, 10:46 PM

## 2024-12-10 NOTE — ED Notes (Signed)
 Notified Dr. Bari of iStat results

## 2024-12-10 NOTE — ED Notes (Signed)
 Critical care and Neuro MDs at bedside.

## 2024-12-10 NOTE — ED Notes (Signed)
 Pt had witnessed seizure by this RN involving incontinence, full body jerking, and forced gaze for approx. 45 seconds. Pt became sinus brady in 40s. EDP at bedside.
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
## 2024-12-10 NOTE — ED Triage Notes (Addendum)
 Pt BIB [facility] from home for involuntary muscle movements. Pt was recently discharged from hospital, upon discharge, pt gabapentin  dosage was greatly decreased. Last night around 7pm, pt began spasming in upper body which lasted all night. In triage, pt began having wheezing and shortness of breath. VSS per EMS. [facility].
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
## 2024-12-11 ENCOUNTER — Inpatient Hospital Stay (HOSPITAL_COMMUNITY)

## 2024-12-11 DIAGNOSIS — I5032 Chronic diastolic (congestive) heart failure: Secondary | ICD-10-CM | POA: Diagnosis not present

## 2024-12-11 DIAGNOSIS — I130 Hypertensive heart and chronic kidney disease with heart failure and stage 1 through stage 4 chronic kidney disease, or unspecified chronic kidney disease: Secondary | ICD-10-CM | POA: Diagnosis not present

## 2024-12-11 DIAGNOSIS — E871 Hypo-osmolality and hyponatremia: Secondary | ICD-10-CM | POA: Diagnosis not present

## 2024-12-11 DIAGNOSIS — D6869 Other thrombophilia: Secondary | ICD-10-CM

## 2024-12-11 DIAGNOSIS — I2510 Atherosclerotic heart disease of native coronary artery without angina pectoris: Secondary | ICD-10-CM

## 2024-12-11 DIAGNOSIS — E1122 Type 2 diabetes mellitus with diabetic chronic kidney disease: Secondary | ICD-10-CM

## 2024-12-11 DIAGNOSIS — N185 Chronic kidney disease, stage 5: Secondary | ICD-10-CM

## 2024-12-11 DIAGNOSIS — R569 Unspecified convulsions: Secondary | ICD-10-CM | POA: Diagnosis not present

## 2024-12-11 DIAGNOSIS — E1110 Type 2 diabetes mellitus with ketoacidosis without coma: Secondary | ICD-10-CM

## 2024-12-11 DIAGNOSIS — J449 Chronic obstructive pulmonary disease, unspecified: Secondary | ICD-10-CM | POA: Diagnosis not present

## 2024-12-11 DIAGNOSIS — Z794 Long term (current) use of insulin: Secondary | ICD-10-CM

## 2024-12-11 LAB — PHOSPHORUS
Phosphorus: 6.8 mg/dL — ABNORMAL HIGH (ref 2.5–4.6)
Phosphorus: 6.9 mg/dL — ABNORMAL HIGH (ref 2.5–4.6)

## 2024-12-11 LAB — CBC
HCT: 31.3 % — ABNORMAL LOW (ref 36.0–46.0)
Hemoglobin: 9.8 g/dL — ABNORMAL LOW (ref 12.0–15.0)
MCH: 22.7 pg — ABNORMAL LOW (ref 26.0–34.0)
MCHC: 31.3 g/dL (ref 30.0–36.0)
MCV: 72.5 fL — ABNORMAL LOW (ref 80.0–100.0)
Platelets: 249 K/uL (ref 150–400)
RBC: 4.32 MIL/uL (ref 3.87–5.11)
RDW: 20.7 % — ABNORMAL HIGH (ref 11.5–15.5)
WBC: 15.3 K/uL — ABNORMAL HIGH (ref 4.0–10.5)
nRBC: 0 % (ref 0.0–0.2)

## 2024-12-11 LAB — GLUCOSE, CAPILLARY
Glucose-Capillary: 100 mg/dL — ABNORMAL HIGH (ref 70–99)
Glucose-Capillary: 103 mg/dL — ABNORMAL HIGH (ref 70–99)
Glucose-Capillary: 107 mg/dL — ABNORMAL HIGH (ref 70–99)
Glucose-Capillary: 132 mg/dL — ABNORMAL HIGH (ref 70–99)
Glucose-Capillary: 193 mg/dL — ABNORMAL HIGH (ref 70–99)

## 2024-12-11 LAB — COMPREHENSIVE METABOLIC PANEL WITH GFR
ALT: 58 U/L — ABNORMAL HIGH (ref 0–44)
AST: 21 U/L (ref 15–41)
Albumin: 2.7 g/dL — ABNORMAL LOW (ref 3.5–5.0)
Alkaline Phosphatase: 112 U/L (ref 38–126)
Anion gap: 17 — ABNORMAL HIGH (ref 5–15)
BUN: 75 mg/dL — ABNORMAL HIGH (ref 8–23)
CO2: 17 mmol/L — ABNORMAL LOW (ref 22–32)
Calcium: 7 mg/dL — ABNORMAL LOW (ref 8.9–10.3)
Chloride: 95 mmol/L — ABNORMAL LOW (ref 98–111)
Creatinine, Ser: 4.93 mg/dL — ABNORMAL HIGH (ref 0.44–1.00)
GFR, Estimated: 9 mL/min — ABNORMAL LOW
Glucose, Bld: 112 mg/dL — ABNORMAL HIGH (ref 70–99)
Potassium: 3.7 mmol/L (ref 3.5–5.1)
Sodium: 130 mmol/L — ABNORMAL LOW (ref 135–145)
Total Bilirubin: 0.4 mg/dL (ref 0.0–1.2)
Total Protein: 5.3 g/dL — ABNORMAL LOW (ref 6.5–8.1)

## 2024-12-11 LAB — RENAL FUNCTION PANEL
Albumin: 2.4 g/dL — ABNORMAL LOW (ref 3.5–5.0)
Anion gap: 18 — ABNORMAL HIGH (ref 5–15)
BUN: 71 mg/dL — ABNORMAL HIGH (ref 8–23)
CO2: 16 mmol/L — ABNORMAL LOW (ref 22–32)
Calcium: 7.5 mg/dL — ABNORMAL LOW (ref 8.9–10.3)
Chloride: 98 mmol/L (ref 98–111)
Creatinine, Ser: 4.91 mg/dL — ABNORMAL HIGH (ref 0.44–1.00)
GFR, Estimated: 9 mL/min — ABNORMAL LOW
Glucose, Bld: 80 mg/dL (ref 70–99)
Phosphorus: 7.4 mg/dL — ABNORMAL HIGH (ref 2.5–4.6)
Potassium: 3.9 mmol/L (ref 3.5–5.1)
Sodium: 131 mmol/L — ABNORMAL LOW (ref 135–145)

## 2024-12-11 LAB — MAGNESIUM
Magnesium: 2.3 mg/dL (ref 1.7–2.4)
Magnesium: 2.2 mg/dL (ref 1.7–2.4)

## 2024-12-11 LAB — VITAMIN D 25 HYDROXY (VIT D DEFICIENCY, FRACTURES): Vit D, 25-Hydroxy: 14.3 ng/mL — ABNORMAL LOW (ref 30–100)

## 2024-12-11 LAB — AMMONIA: Ammonia: 36 umol/L — ABNORMAL HIGH (ref 9–35)

## 2024-12-11 MED ORDER — CARVEDILOL 6.25 MG PO TABS
6.2500 mg | ORAL_TABLET | Freq: Two times a day (BID) | ORAL | Status: DC
  Administered 2024-12-11 – 2024-12-12 (×2): 6.25 mg via ORAL
  Filled 2024-12-11 (×3): qty 1

## 2024-12-11 MED ORDER — OXYCODONE-ACETAMINOPHEN 5-325 MG PO TABS
1.0000 | ORAL_TABLET | Freq: Four times a day (QID) | ORAL | Status: DC | PRN
  Administered 2024-12-11 – 2024-12-12 (×2): 1 via ORAL
  Filled 2024-12-11: qty 1

## 2024-12-11 MED ORDER — OXYCODONE HCL 5 MG PO TABS
5.0000 mg | ORAL_TABLET | Freq: Four times a day (QID) | ORAL | Status: DC | PRN
  Administered 2024-12-11 – 2024-12-12 (×2): 5 mg via ORAL
  Filled 2024-12-11 (×2): qty 1

## 2024-12-11 MED ORDER — OXYCODONE-ACETAMINOPHEN 5-325 MG PO TABS
1.0000 | ORAL_TABLET | Freq: Four times a day (QID) | ORAL | Status: DC | PRN
  Filled 2024-12-11: qty 1

## 2024-12-11 MED ORDER — INSULIN ASPART 100 UNIT/ML IJ SOLN
0.0000 [IU] | Freq: Three times a day (TID) | INTRAMUSCULAR | Status: DC
  Administered 2024-12-12: 5 [IU] via SUBCUTANEOUS
  Filled 2024-12-11: qty 5

## 2024-12-11 MED ORDER — PHENYLEPHRINE HCL-NACL 20-0.9 MG/250ML-% IV SOLN: 0.0000 ug/min | INTRAVENOUS | Status: DC

## 2024-12-11 NOTE — Progress Notes (Addendum)
 "  NAME:  Alejandra Hernandez, MRN:  [id number], DOB:  1956-04-27, LOS: 1 ADMISSION DATE:  12/10/2024, CONSULTATION DATE:  12/10/2024 REFERRING MD:  Dr. Bari, CHIEF COMPLAINT: seizure   History of Present Illness:  Alejandra Hernandez is a 69 year old female with history of HTN, COPD, chronic diastolic heart failure, tobacco abuse, CKD stage V, T2DM, CAD s/p stent RCA, PAD with stent placement, history of DVT in 2018 on Eliquis  who presented to ED by EMS for involuntary muscle movements. Per ED attending on arrival patient was having upper body spasmodic movements but remained alert and oriented and able to follow commands throughout. She was ordered 0.5 mg IV Ativan  for spasmodic movements. She then developed acute tonic-clonic seizure and received another 1 mg of Ativan  and was loaded with Keppra . A CTH was unremarkable. Labs were notable for significant electrolyte abnormalities including hypocalcemia with calcium  5.7 and ionized calcium  0.73 and hypomagnesemia with Mg 1.2. PCCM was consulted for admission given altered mental status and metabolic abnormalities.    She was recently admitted 11/26/2024-11/30/2024 for shortness of breath and treated for COPD exacerbation, CAP and IV diuresed. She was discharged on cefdinir  and doxycycline  as well as a steroid taper. She was also following by nephrology for worsening renal disease and dialysis was discussed but ultimately her bicarbonate improved and she was cleared for discharge with plan to follow-up on 12/05/2024 with nephrology.  Significant Hospital Events:  1/17 admitted to ICU for seizure and encephalopathy  Interim History / Subjective:  Mental status improved overnight No seizures reported  Nicardipine  started briefly for hypertension Now awake with appetite  Objective   Blood pressure (!) 151/61, pulse 63, temperature (!) 97.4 F (36.3 C), temperature source Axillary, resp. rate 10, height 5' 3 (1.6 m), weight 62.6 kg, SpO2 99%.        Intake/Output  Summary (Last 24 hours) at 12/11/2024 0924 Last data filed at 12/11/2024 0400 Gross per 24 hour  Intake 389.49 ml  Output --  Net 389.49 ml   Filed Weights   12/10/24 1438 12/10/24 2230 12/11/24 0317  Weight: 58.5 kg 62.6 kg 62.6 kg    Examination: No distress Regular rate and rhythm  Breathing comfortably on 2 liter/min nasal cannula, breath sounds normal anteriorly Abdomen non-tender, bowel sounds noted Alert and oriented to person and hospital, pupils equal and reactive, normal eye movements, tongue and palate midline, no pronator drift, speech and responses normal and appropriate  Labs: Sodium 130 Potassium 3.7 Creatinine 4.93 WBC 15.3 Hgb 9.8 Lactic acid 0.6  Resolved Hospital Problem list   Resolved Problems:   * No resolved hospital problems. *   Assessment & Plan:  Principal Problem:   Seizure (HCC) Active Problems:   Uncontrolled type 2 diabetes mellitus with ketoacidosis without coma, with long-term current use of insulin  (HCC)   Essential hypertension   Chronic diastolic CHF (congestive heart failure) (HCC)   CAD (coronary artery disease)   COPD, mild (HCC)   Hypercoagulable state   Anemia of chronic renal failure   CKD (chronic kidney disease) stage 5, GFR less than 15 ml/min (HCC)  Seizure New onset. Provoked multifactorial in setting of multiple electrolyte abnormalities. PRES was considered but less likely. Today's spot EEG without epileptiform changes. -neurology following -MRI brain pending -repeat BMP, mag, phos now  COPD Chronic, stable. On 2 liter/min and 91% spo2. -ICS/LAMA/LABA -duonebs as needed -wean O2 as able  Hypertension Chronic severe hypertension. Stable. Asymptomatic. SBP at home usually 180-190. Aim for gradual  normotension. -increase carvedilol  to 6.25 mg BID  Chronic HFpEF Stable, not grossly volume overloaded. -afterload reduction with antihypertensives  ASCVD With CAD and PAD -outpatient statin  resumed  Hypercoagulable state VTE in 2018, on DOAC since then. -resume outpatient Eliquis  BID  Anemia Chronic. In setting of renal disease. Stable CBC without signs of bleeding. -CBC as needed  CKD 5 Chronic. Stable creatinine. -RFP as needed  Hypocalcemia Hypomagnesemia Improved status post replacement. Query hyperphosphatemia in renal disease as inciting factor. -Repeat labs -PTH, ionized calcium  -Vit D  Condition has improved. She is stable for transfer out of MICU.  Best practice (daily eval):  Diet/type: Regular consistency (see orders) DVT prophylaxis: DOAC GI prophylaxis: N/A Lines: N/A Foley:  N/A Code Status:  full code Last date of multidisciplinary goals of care discussion [n/a]  Ozell Kung MD 12/11/2024, 9:24 AM  Pager: [phone]  "

## 2024-12-11 NOTE — Procedures (Signed)
 Patient Name: DEBROH SIELOFF  MRN: [id number]  Epilepsy Attending: Arlin MALVA Krebs  Referring Physician/Provider: Dr Aisha Seals Date: 1'/17/2026 Duration: 3 hour 13 mins  Patient history: 69 y.o. female with new onset seizure in the setting of previous myoclonus and multiple electrolyte abnormalities including hypocalcemia, hypomagnesemia, hyponatremia. Rapid EEG to evaluate for seizure  Level of alertness: Awake  AEDs during EEG study: None  Technical aspects:This EEG was obtained using a 10 lead EEG system positioned circumferentially without any parasagittal coverage (rapid EEG). Computer selected EEG is reviewed as  well as background features and all clinically significant events.  Description: The posterior dominant rhythm consists of 7 Hz activity of moderate voltage (25-35 uV) seen predominantly in posterior head regions, symmetric and reactive to eye opening and eye closing. EEG showed continuous generalized 5-7hz  theta slowing admixed with intermittent 2-3hz  delta slowing. Hyperventilation and photic stimulation were not performed.     ABNORMALITY - Continuous slow, generalized  IMPRESSION: This limited ceribell EEG is suggestive of mild diffuse encephalopathy. No seizures or epileptiform discharges were seen throughout the recording.  If suspicion for interictal activity remains a concern, a conventional EEG with full montage and video can be considered.   [name] O Rushawn Capshaw

## 2024-12-11 NOTE — Plan of Care (Signed)
" °  Problem: Coping: Goal: Ability to adjust to condition or change in health will improve Outcome: Progressing   Problem: Health Behavior/Discharge Planning: Goal: Ability to identify and utilize available resources and services will improve Outcome: Progressing Goal: Ability to manage health-related needs will improve Outcome: Progressing   Problem: Metabolic: Goal: Ability to maintain appropriate glucose levels will improve Outcome: Progressing   Problem: Skin Integrity: Goal: Risk for impaired skin integrity will decrease Outcome: Progressing   Problem: Tissue Perfusion: Goal: Adequacy of tissue perfusion will improve Outcome: Progressing   Problem: Education: Goal: Knowledge of General Education information will improve Description: Including pain rating scale, medication(s)/side effects and non-pharmacologic comfort measures Outcome: Progressing   Problem: Clinical Measurements: Goal: Ability to maintain clinical measurements within normal limits will improve Outcome: Progressing Goal: Will remain free from infection Outcome: Progressing Goal: Diagnostic test results will improve Outcome: Progressing Goal: Respiratory complications will improve Outcome: Progressing Goal: Cardiovascular complication will be avoided Outcome: Progressing   Problem: Activity: Goal: Risk for activity intolerance will decrease Outcome: Progressing   Problem: Coping: Goal: Level of anxiety will decrease Outcome: Progressing   Problem: Fluid Volume: Goal: Ability to maintain a balanced intake and output will improve Outcome: Not Progressing   Problem: Nutritional: Goal: Maintenance of adequate nutrition will improve Outcome: Not Progressing   Problem: Nutrition: Goal: Adequate nutrition will be maintained Outcome: Not Progressing   Problem: Elimination: Goal: Will not experience complications related to bowel motility Outcome: Not Progressing Goal: Will not experience  complications related to urinary retention Outcome: Not Progressing   "

## 2024-12-11 NOTE — Progress Notes (Signed)
 McLendon, MD notified of higher blood pressures, 190-210s. MD increased scheduled carvedilol  dose. No other new orders at this time.
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
## 2024-12-12 ENCOUNTER — Ambulatory Visit: Admitting: Podiatry

## 2024-12-12 DIAGNOSIS — R569 Unspecified convulsions: Secondary | ICD-10-CM | POA: Diagnosis not present

## 2024-12-12 LAB — CBC
HCT: 29.8 % — ABNORMAL LOW (ref 36.0–46.0)
Hemoglobin: 9.4 g/dL — ABNORMAL LOW (ref 12.0–15.0)
MCH: 23.1 pg — ABNORMAL LOW (ref 26.0–34.0)
MCHC: 31.5 g/dL (ref 30.0–36.0)
MCV: 73.2 fL — ABNORMAL LOW (ref 80.0–100.0)
Platelets: 224 K/uL (ref 150–400)
RBC: 4.07 MIL/uL (ref 3.87–5.11)
RDW: 20.8 % — ABNORMAL HIGH (ref 11.5–15.5)
WBC: 10.9 K/uL — ABNORMAL HIGH (ref 4.0–10.5)
nRBC: 0 % (ref 0.0–0.2)

## 2024-12-12 LAB — RENAL FUNCTION PANEL
Albumin: 2.5 g/dL — ABNORMAL LOW (ref 3.5–5.0)
Anion gap: 17 — ABNORMAL HIGH (ref 5–15)
BUN: 71 mg/dL — ABNORMAL HIGH (ref 8–23)
CO2: 15 mmol/L — ABNORMAL LOW (ref 22–32)
Calcium: 7.5 mg/dL — ABNORMAL LOW (ref 8.9–10.3)
Chloride: 98 mmol/L (ref 98–111)
Creatinine, Ser: 5.58 mg/dL — ABNORMAL HIGH (ref 0.44–1.00)
GFR, Estimated: 8 mL/min — ABNORMAL LOW
Glucose, Bld: 298 mg/dL — ABNORMAL HIGH (ref 70–99)
Phosphorus: 7 mg/dL — ABNORMAL HIGH (ref 2.5–4.6)
Potassium: 4.4 mmol/L (ref 3.5–5.1)
Sodium: 130 mmol/L — ABNORMAL LOW (ref 135–145)

## 2024-12-12 LAB — CALCIUM, IONIZED: Calcium, Ionized, Serum: 4 mg/dL — ABNORMAL LOW (ref 4.5–5.6)

## 2024-12-12 LAB — GLUCOSE, CAPILLARY
Glucose-Capillary: 110 mg/dL — ABNORMAL HIGH (ref 70–99)
Glucose-Capillary: 291 mg/dL — ABNORMAL HIGH (ref 70–99)

## 2024-12-12 LAB — MAGNESIUM: Magnesium: 2.1 mg/dL (ref 1.7–2.4)

## 2024-12-12 MED ORDER — VITAMIN D (ERGOCALCIFEROL) 1.25 MG (50000 UNIT) PO CAPS
50000.0000 [IU] | ORAL_CAPSULE | ORAL | Status: DC
  Filled 2024-12-12: qty 1

## 2024-12-12 NOTE — Plan of Care (Signed)
" °  Problem: Skin Integrity: Goal: Risk for impaired skin integrity will decrease Outcome: Progressing   Problem: Education: Goal: Knowledge of General Education information will improve Description: Including pain rating scale, medication(s)/side effects and non-pharmacologic comfort measures Outcome: Progressing   Problem: Clinical Measurements: Goal: Will remain free from infection Outcome: Progressing   Problem: Activity: Goal: Risk for activity intolerance will decrease Outcome: Progressing   "

## 2024-12-12 NOTE — Plan of Care (Signed)
 On-call neurology note  Signed out by the outgoing neurologist to check up on the MRI of the brain.  MRI brain shows no acute findings. No further inpatient workup needed at this time. Can follow-up with outpatient neurology in 8 to 12 weeks if desired. Neurology will be available as needed  Plan relayed to Dr. Sonjia Eligio Lav, MD Neurology

## 2024-12-12 NOTE — TOC Transition Note (Signed)
 Transition of Care [facility]) - Discharge Note   Patient Details  Name: Alejandra Hernandez MRN: [id number] Date of Birth: Feb 14, 1956  [facility]) CM/SW Contact:  Rosalva Jon Bloch, RN Phone Number: 12/12/2024, 10:51 AM   Clinical Narrative:    Patient will DC to: Home Anticipated DC date: 12/12/2024 Family notified: yes Transport by: car  Readmit with new onset seizure.  Per MD patient ready for DC today. RN, patient, and  patient's family notified of DC. States uses home oxygen prn, provider [employer]. Pt states will f/u with PCP for post hospitalization appointment ( office currently closed 2/2 holiday). Pt without RX med concerns or transportation issues. Hospital f/u noted on AVS. Family member to provide transportation to home. RNCM will sign off for now as intervention is no longer needed. Please consult us  again if new needs arise.    Final next level of care: Home/Self Care Barriers to Discharge: No Barriers Identified   Patient Goals and CMS Choice            Discharge Placement                       Discharge Plan and Services Additional resources added to the After Visit Summary for                                       Social Drivers of Health (SDOH) Interventions SDOH Screenings   Food Insecurity: Food Insecurity Present (11/27/2024)  Housing: Low Risk (11/27/2024)  Transportation Needs: No Transportation Needs (11/27/2024)  Utilities: Not At Risk (11/27/2024)  Social Connections: Moderately Integrated (11/28/2024)  Tobacco Use: High Risk (12/10/2024)     Readmission Risk Interventions     No data to display
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
## 2024-12-12 NOTE — Hospital Course (Addendum)
" °  Seizure Likely provoked secondary to hypocalcemia.[name]  EEG without epileptiform changes.  MRI of the brain without any acute abnormality except for age-related atrophy. Neurology recommend no further workup at this time and as needed outpatient follow-up.  No antiseizure medications have been prescribed at this time.  Hypocalcemia has resolved at this time.   COPD On room air.  Continue incentive spirometry LAMA LABA DuoNeb.  Currently compensated.   Essential hypertension Chronic severe hypertension. Continue Coreg  twice daily.  Blood pressure seems to be stable at this time.   Chronic HFpEF Euvolemic.    ASCVD with CAD and PAD Continue statins   Hypercoagulable state History of VTE in 2018 and on DOAC since then.  Continue Eliquis  twice daily   Anemia of chronic kidney disease. CKD 5 Continue to monitor hemoglobin and hemoglobin.  Creatinine at this time at 5.5.  Placement of fistula in the past was unsuccessful but patient is euvolemic at this time.   Hypocalcemia Hypomagnesemia Improved and resolved.  Has low calcium .  Vitamin D  deficiency.  Vitamin D  at 14.3.  Will replenish with 50,000 units of vitamin D  q. weekly.  Will need to continue vitamin D  supplementation after discharge.  "

## 2024-12-13 LAB — PTH, INTACT AND CALCIUM
Calcium, Total (PTH): 7 mg/dL — ABNORMAL LOW (ref 8.7–10.3)
PTH: 91 pg/mL — ABNORMAL HIGH (ref 15–65)

## 2024-12-14 ENCOUNTER — Other Ambulatory Visit (HOSPITAL_COMMUNITY): Payer: Self-pay | Admitting: Nephrology

## 2024-12-14 NOTE — Progress Notes (Unsigned)
 Received updated Retacrit  orders  Dose: 25000 units SQ every 2 weeks  Labs: POCT Hgb at every visit, iron  panel and ferritin every 4 weeks  Lst dose 12/09/2024 Next dose 12/23/2024 (appropriate)  Reached out to [facility] for clarification on dose. Transferred to [facility], left VM. Requesting change to either 23000 or 30000 units per dose to minimize injection burden and optimize vial sizes in stock. Requested fax with updated orders  Sherry Pennant, PharmD, MPH, BCPS, CPP Clinical Pharmacist

## 2024-12-15 ENCOUNTER — Encounter (HOSPITAL_COMMUNITY): Payer: Self-pay | Admitting: Nephrology

## 2024-12-15 ENCOUNTER — Ambulatory Visit (INDEPENDENT_AMBULATORY_CARE_PROVIDER_SITE_OTHER): Admitting: Podiatry

## 2024-12-15 DIAGNOSIS — L600 Ingrowing nail: Secondary | ICD-10-CM | POA: Diagnosis not present

## 2024-12-15 NOTE — Patient Instructions (Signed)

## 2024-12-15 NOTE — Progress Notes (Signed)
 Patient complains of painful ingrown hallux right.  Nail can still continues to bother her to stick despite soaking the toe.  Patient denies fevers, chills, nausea, vomiting.  Objective:  Vitals: Reviewed  General: Well developed, nourished, in no acute distress, alert and oriented x3   Vascular: DP pulse 1/4 bilateral. PT pulse 1/4 bilateral.  Moderate edema toe with ingrown nail.  Capillary refill time immediate bilaterally  Dermatology: Erythema, edema, incurvated nail borders gryphotic nail hallux right with no drainage . Tenderness present with palpation. Normal skin tone and texture feet with normal hair growth.  Dried blood under nail.  Neurological: Grossly intact. Normal reflexes.   Musculoskeletal: Tenderness with palpation of the distal hallux right. No tenderness or painful ROM at IPJ.  Diagnosis: 1.  Ingrown nail hallux right  Plan: -discussed etiology and treatment of ingrown nails. Discussed surgical vs conservative treatment. -Consent signed for appropriate avulsion affected nail(s).   Procedure(s):   - Avulsion(s) hallux nail right: Toe anesthetized with 3cc 1:1 mixture 2% Lidocaine : 0.5% bupivacaine plain. Surgical site prepped. Digital tourniquet applied.  Avulsion of nail plate. performed.  Tourniquet released with good vascularity noticed in digit.  Applied triple antibiotic to nailbed and applied gauze and Coban dressing. - Written and oral postoperative instructions given.  -Return for post-op 2 weeks.  Alejandra Hernandez, DPM

## 2024-12-15 NOTE — Progress Notes (Signed)
 Received updated Retacrit  orders  Dose: 30000 units SQ every 2 weeks   Labs: POCT Hgb at every visit, iron  panel and ferritin every 4 weeks   Lst dose 12/09/2024 Next dose 12/23/2024 (appropriate)    Sherry Pennant, PharmD, MPH, BCPS, CPP Clinical Pharmacist

## 2024-12-18 ENCOUNTER — Other Ambulatory Visit: Payer: Self-pay | Admitting: Cardiology

## 2024-12-23 ENCOUNTER — Encounter (HOSPITAL_COMMUNITY)
Admission: RE | Admit: 2024-12-23 | Discharge: 2024-12-23 | Disposition: A | Source: Ambulatory Visit | Attending: Nephrology

## 2024-12-23 VITALS — BP 156/60 | HR 70 | Temp 97.0°F | Resp 17

## 2024-12-23 DIAGNOSIS — D631 Anemia in chronic kidney disease: Secondary | ICD-10-CM

## 2024-12-23 LAB — POCT HEMOGLOBIN-HEMACUE: Hemoglobin: 7.7 g/dL — ABNORMAL LOW (ref 12.0–15.0)

## 2024-12-23 MED ORDER — EPOETIN ALFA-EPBX 20000 UNIT/ML IJ SOLN
20000.0000 [IU] | Freq: Once | INTRAMUSCULAR | Status: AC
  Administered 2024-12-23: 20000 [IU] via SUBCUTANEOUS

## 2024-12-23 MED ORDER — EPOETIN ALFA-EPBX 10000 UNIT/ML IJ SOLN
INTRAMUSCULAR | Status: AC
  Filled 2024-12-23: qty 1

## 2024-12-23 MED ORDER — EPOETIN ALFA-EPBX 20000 UNIT/ML IJ SOLN
INTRAMUSCULAR | Status: AC
  Filled 2024-12-23: qty 1

## 2024-12-23 MED ORDER — EPOETIN ALFA-EPBX 10000 UNIT/ML IJ SOLN
10000.0000 [IU] | Freq: Once | INTRAMUSCULAR | Status: AC
  Administered 2024-12-23: 10000 [IU] via SUBCUTANEOUS

## 2024-12-23 NOTE — Progress Notes (Signed)
 Patient here at [facility] infusion [facility] for retacrit  injection. Her hemoglobin is 7.7 mg/dl. Pt complains of seeing some dark stool and occasional dizziness.  [facility] notified. Advised to go ahead and give retacrit  30,000 Units as ordered, and send pt to emergency for evaluation. Pt advised as above, she says she has appointment with her dr. On 12/30/24 and would discuss at that time, however pt states she would visit emergency in case of worsening symptoms.

## 2024-12-27 ENCOUNTER — Other Ambulatory Visit: Payer: Self-pay | Admitting: Cardiovascular Disease

## 2024-12-27 MED ORDER — ISOSORBIDE MONONITRATE ER 120 MG PO TB24: ORAL_TABLET | ORAL | 1 refills | Status: AC

## 2024-12-29 ENCOUNTER — Encounter: Payer: Self-pay | Admitting: Podiatry

## 2024-12-29 ENCOUNTER — Ambulatory Visit: Admitting: Podiatry

## 2024-12-29 DIAGNOSIS — L600 Ingrowing nail: Secondary | ICD-10-CM

## 2024-12-29 NOTE — Progress Notes (Signed)
 Patient presents follow-up nail surgery toe.  No complaints.  Physical exam:  Dermatologic: Nail surgery site for avulsion healing well with no signs of infection.  Diagnosis: 1.  Status post avulsion hallux nail.  Healing well  Plan: - POV status post nail surgery , if patient has any problems she can call for appointment otherwise we can see her as needed  - Return as needed

## 2025-01-06 ENCOUNTER — Encounter (HOSPITAL_COMMUNITY)

## 2025-03-01 ENCOUNTER — Ambulatory Visit: Admitting: Podiatry
